# Patient Record
Sex: Female | Born: 1940 | Race: White | Hispanic: No | Marital: Single | Smoking: Never smoker
Health system: Southern US, Community
[De-identification: ages and names within clinical notes are randomized; demographics above are authoritative.]

## PROBLEM LIST (undated history)

## (undated) DIAGNOSIS — M797 Fibromyalgia: Secondary | ICD-10-CM

## (undated) DIAGNOSIS — E0500 Thyrotoxicosis with diffuse goiter without thyrotoxic crisis or storm: Secondary | ICD-10-CM

## (undated) DIAGNOSIS — C801 Malignant (primary) neoplasm, unspecified: Secondary | ICD-10-CM

## (undated) DIAGNOSIS — F419 Anxiety disorder, unspecified: Secondary | ICD-10-CM

## (undated) DIAGNOSIS — E119 Type 2 diabetes mellitus without complications: Secondary | ICD-10-CM

## (undated) DIAGNOSIS — B009 Herpesviral infection, unspecified: Secondary | ICD-10-CM

## (undated) DIAGNOSIS — J4520 Mild intermittent asthma, uncomplicated: Principal | ICD-10-CM

## (undated) DIAGNOSIS — R002 Palpitations: Secondary | ICD-10-CM

## (undated) DIAGNOSIS — K219 Gastro-esophageal reflux disease without esophagitis: Secondary | ICD-10-CM

## (undated) DIAGNOSIS — E785 Hyperlipidemia, unspecified: Secondary | ICD-10-CM

## (undated) DIAGNOSIS — M353 Polymyalgia rheumatica: Secondary | ICD-10-CM

## (undated) HISTORY — PX: CARPAL TUNNEL RELEASE: SHX101

## (undated) HISTORY — PX: APPENDECTOMY: SHX54

## (undated) HISTORY — PX: TOTAL ABDOMINAL HYSTERECTOMY W/ BILATERAL SALPINGOOPHORECTOMY: SHX83

## (undated) HISTORY — DX: Mild intermittent asthma, uncomplicated: J45.20

## (undated) HISTORY — PX: ABDOMINAL HYSTERECTOMY: SHX81

## (undated) HISTORY — PX: HERNIA REPAIR: SHX51

---

## 2010-03-21 ENCOUNTER — Emergency Department (HOSPITAL_COMMUNITY)
Admission: EM | Admit: 2010-03-21 | Discharge: 2010-03-21 | Payer: Self-pay | Source: Home / Self Care | Admitting: Emergency Medicine

## 2010-03-21 IMAGING — CR DG CHEST 1V
1 series · 1 of 1 positions shown · non-contrast
Comparison: None.

CLINICAL DATA: Pain post fall

CHEST - 1 VIEW

[view not recorded]
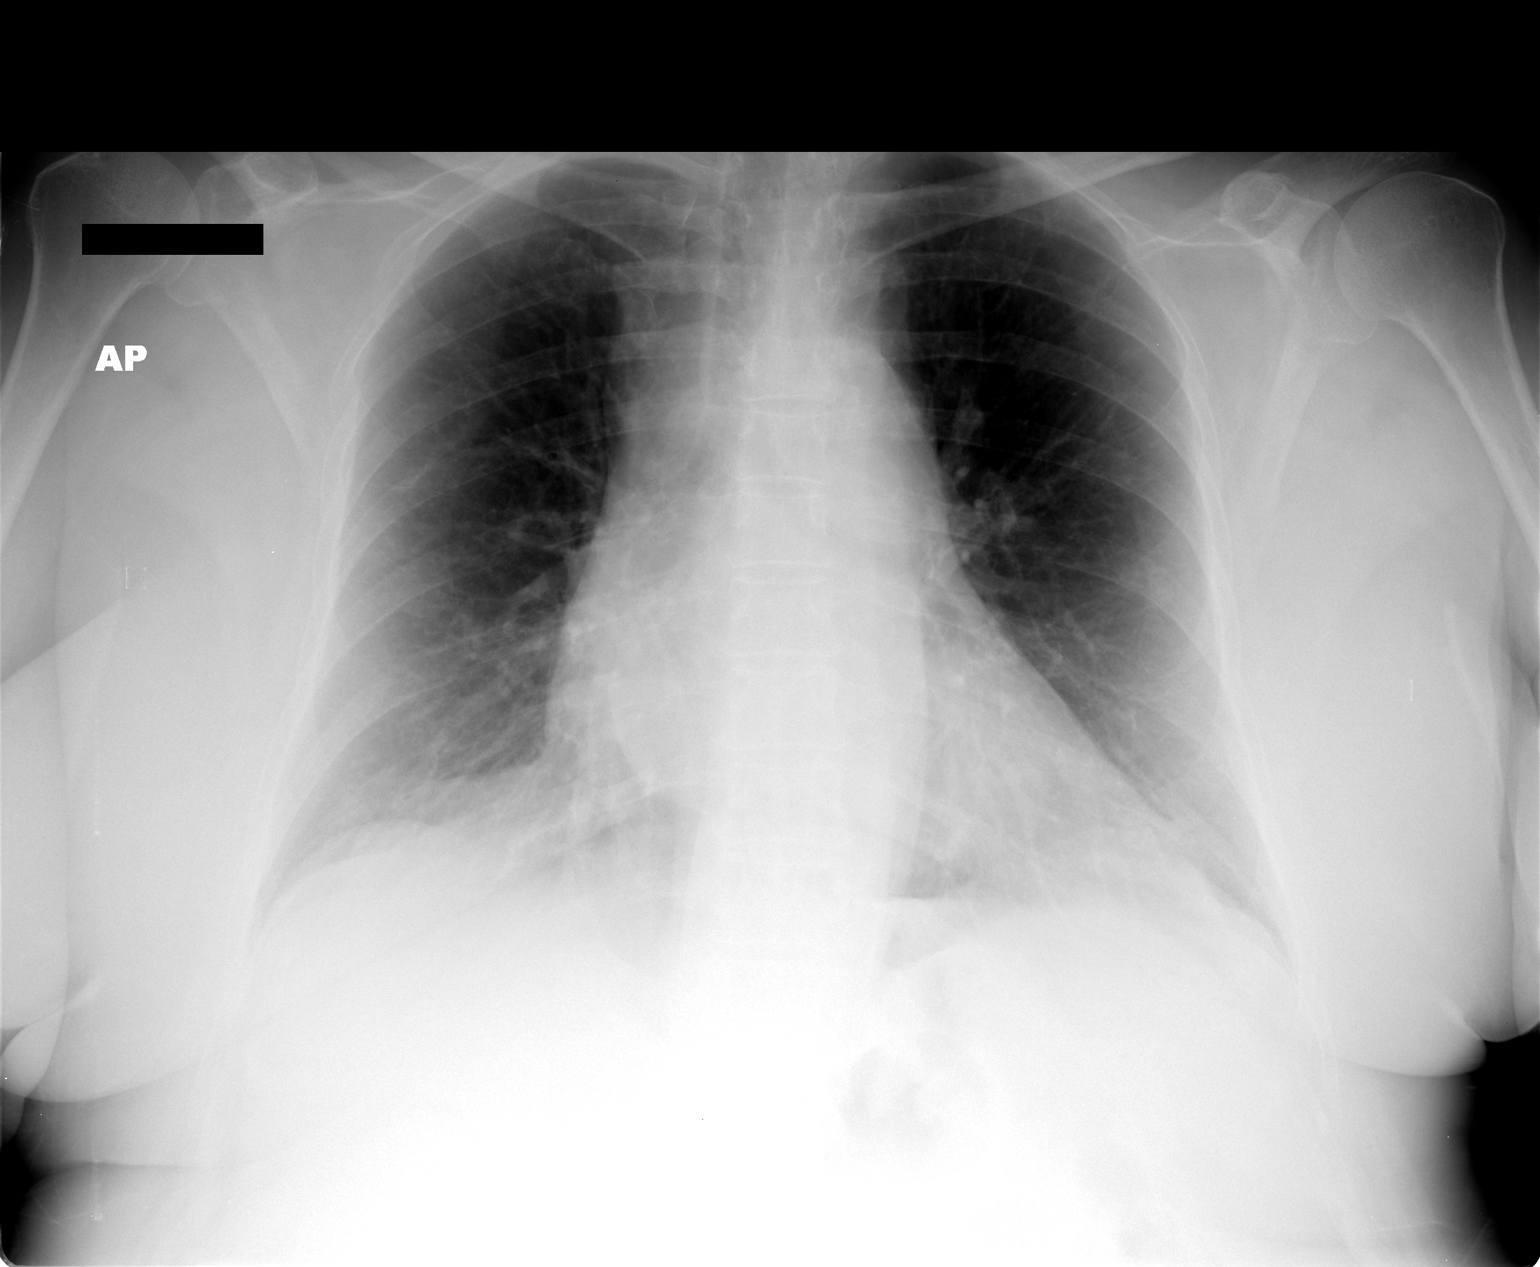

[1 of 1 positions shown; findings below may reference images not displayed]

FINDINGS: Mild cardiomegaly.  Lungs are clear.  No pneumothorax or
effusion.  Regional bones unremarkable.
IMPRESSION: 1.  Mild cardiomegaly

## 2010-03-21 IMAGING — CR DG LUMBAR SPINE 2-3V
3 series · 3 of 3 positions shown · non-contrast
Comparison: None.

CLINICAL DATA: Pain post fall

LUMBAR SPINE - 2-3 VIEW

[view not recorded (1 of 3)]
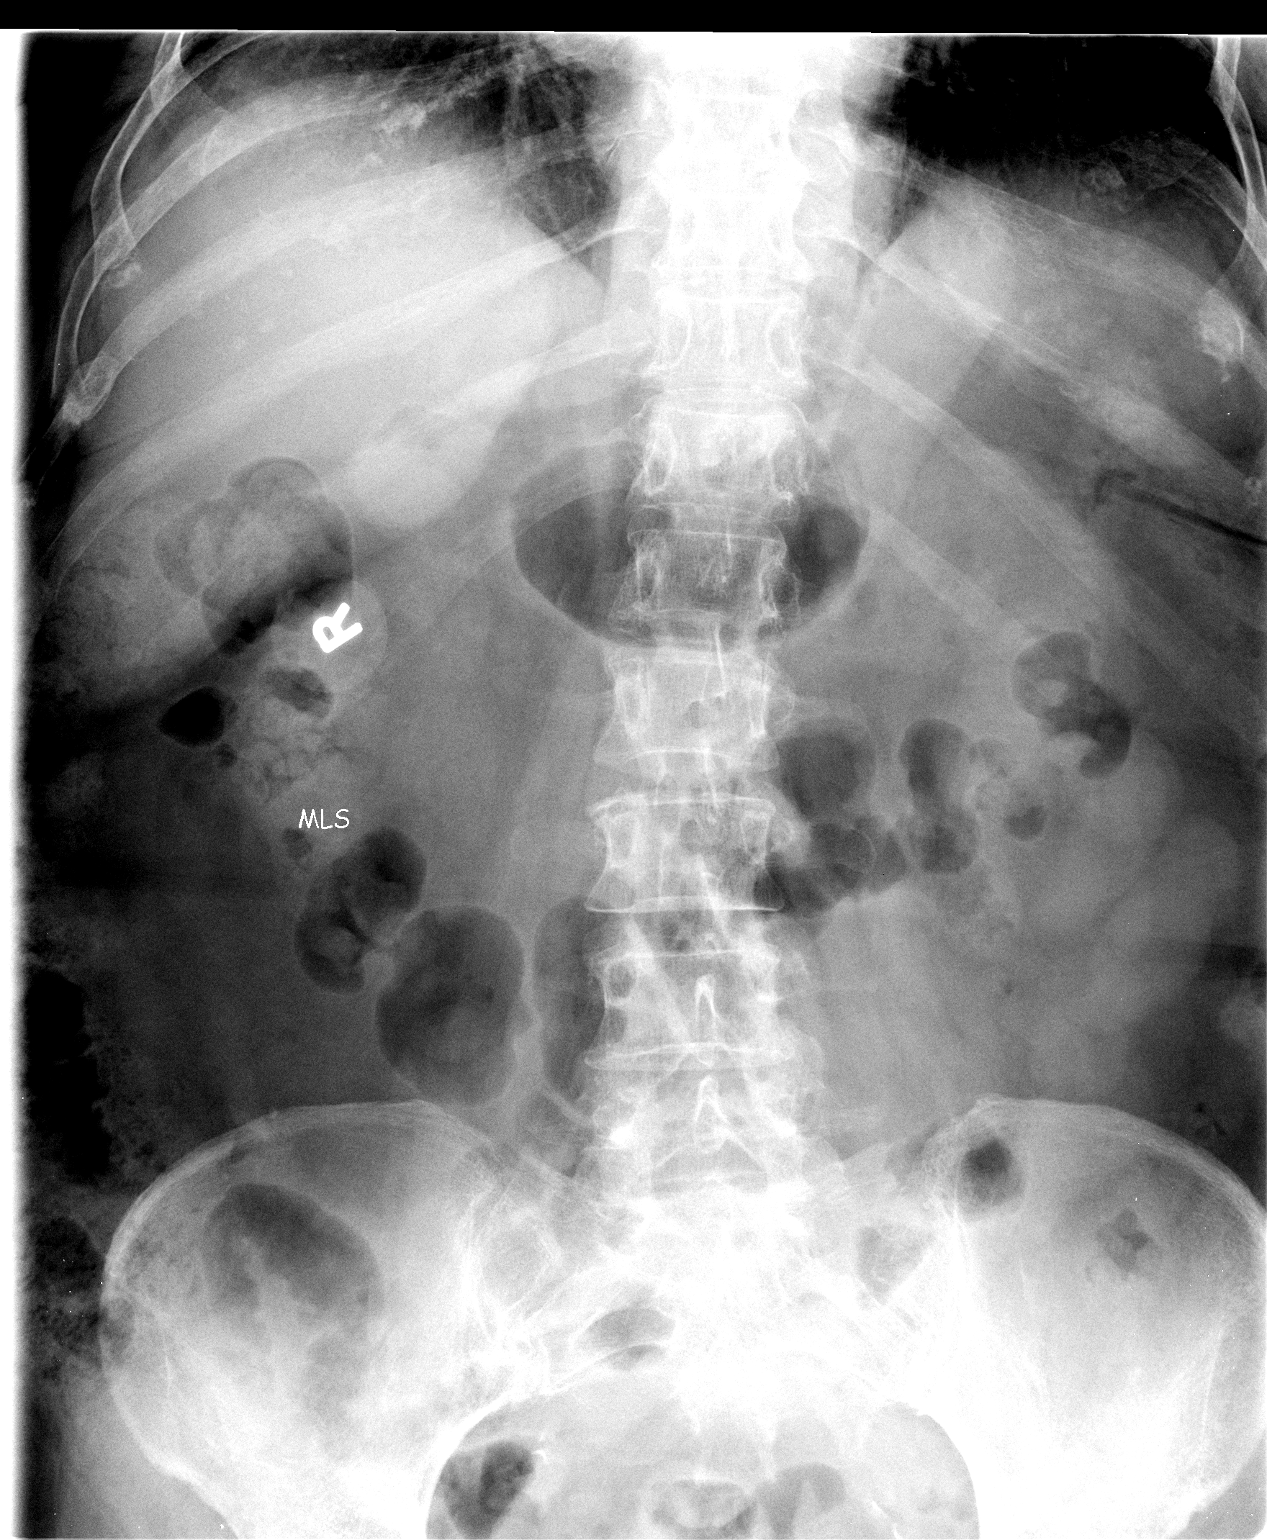

[view not recorded (2 of 3)]
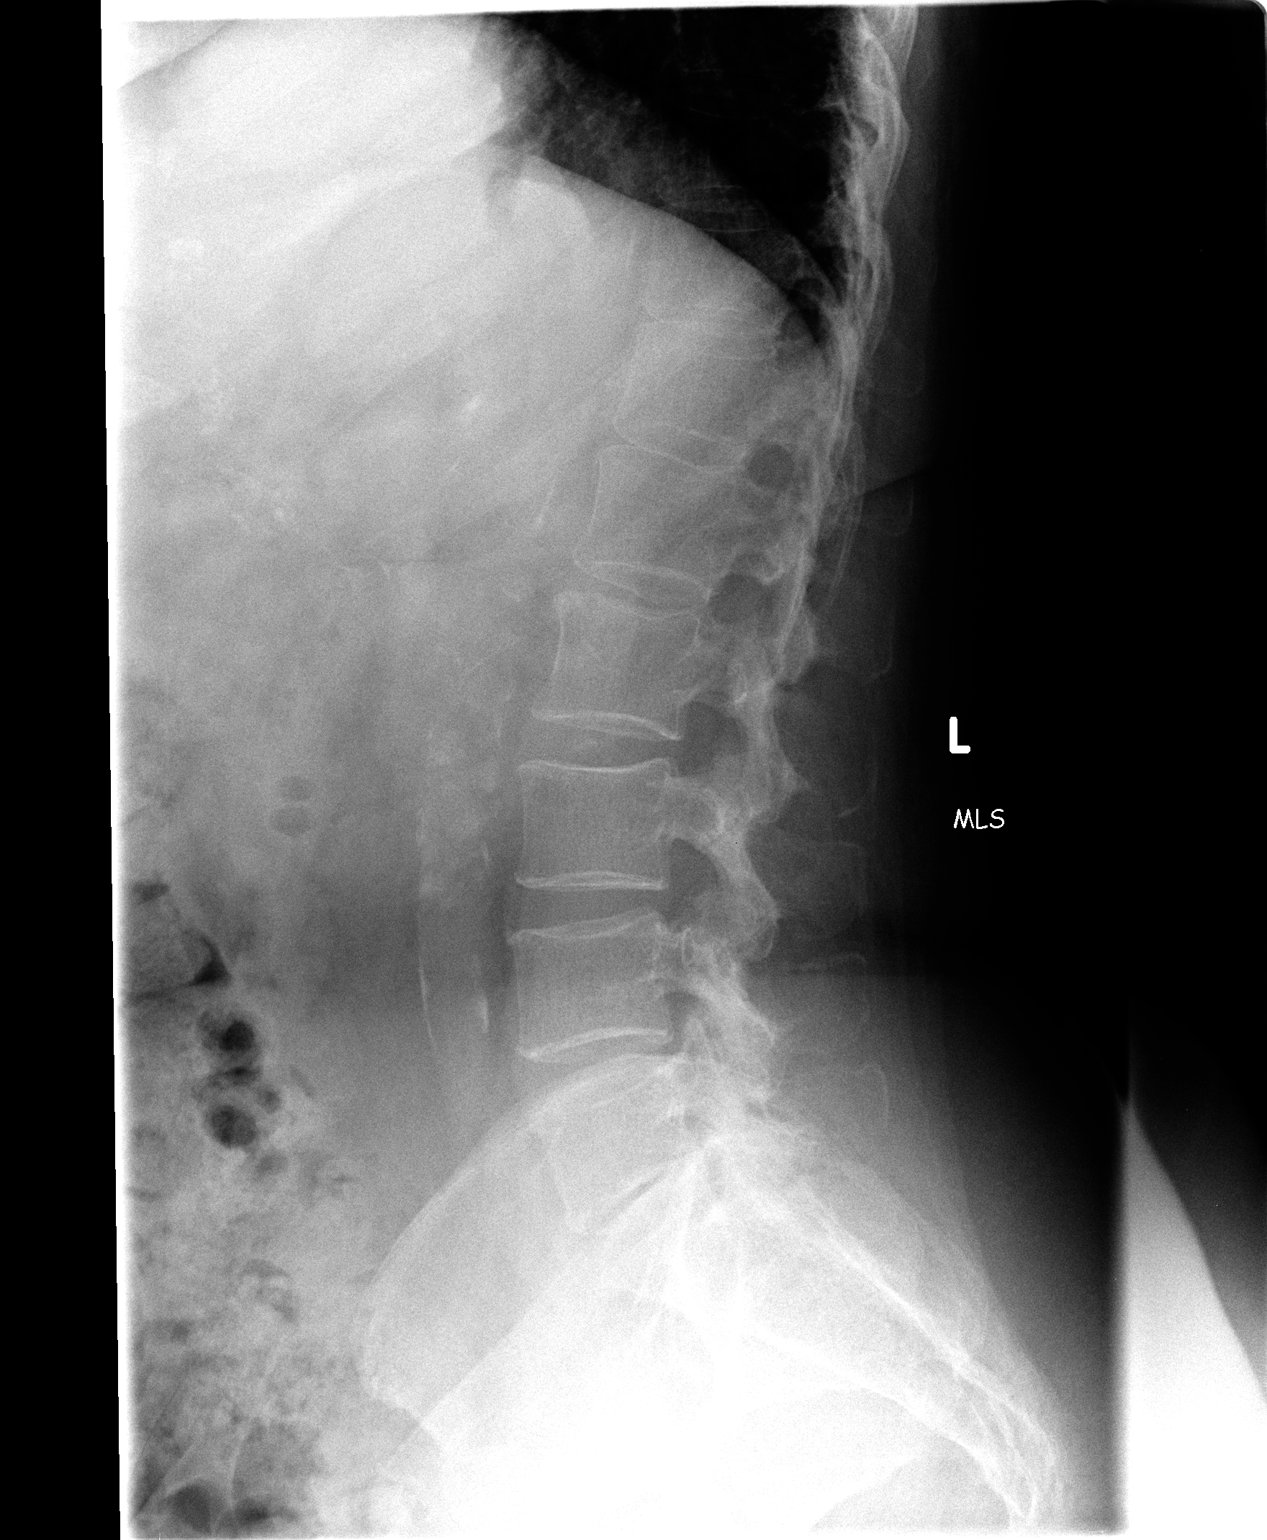

[view not recorded (3 of 3)]
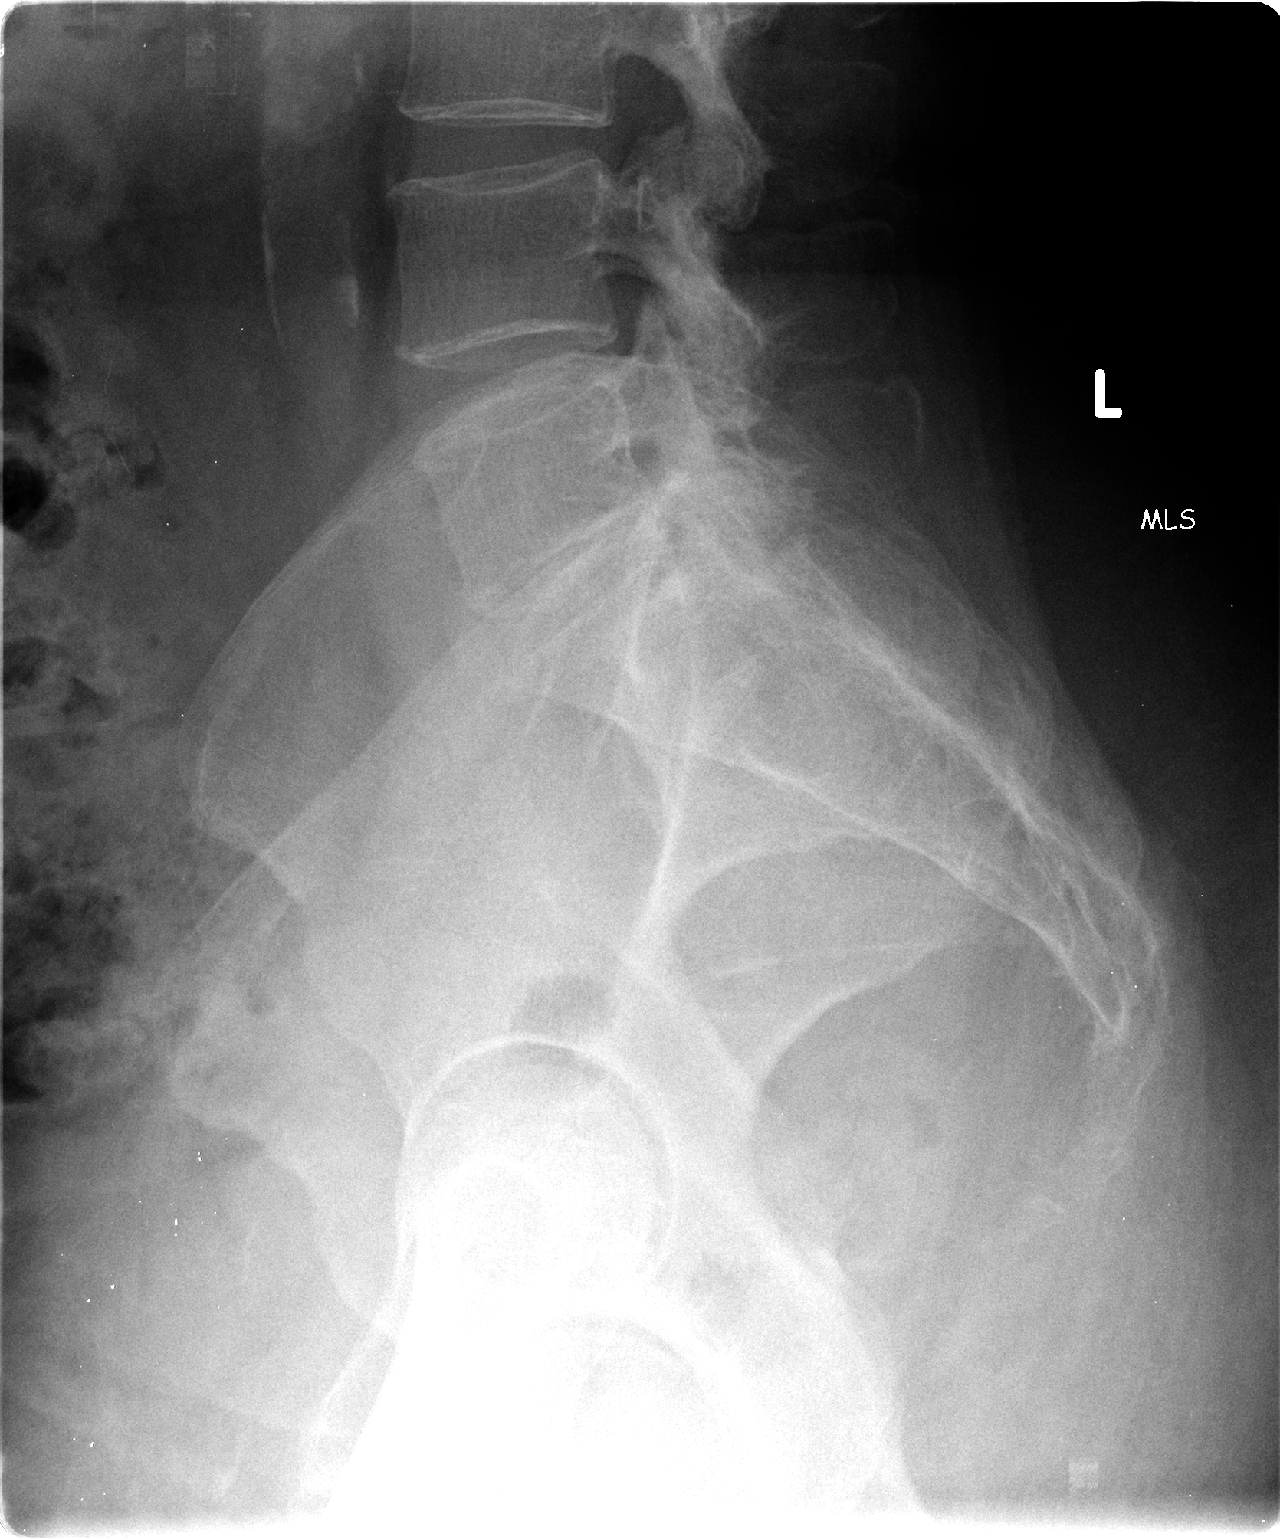

[3 of 3 positions shown; findings below may reference images not displayed]

FINDINGS: Narrowing of the L5-S1 interspace.  Normal alignment.
Negative for fracture.  Aortic and pelvic vascular calcifications.
IMPRESSION: 1.  Negative for fracture or other acute bone injury.
2.  Degenerative disc disease L5-S1.

## 2010-03-21 IMAGING — CR DG WRIST COMPLETE 3+V*L*
4 series · 4 of 4 positions shown · non-contrast
Comparison: None.

CLINICAL DATA: Pain post fall

LEFT WRIST - COMPLETE 3+ VIEW

[view not recorded (1 of 4)]
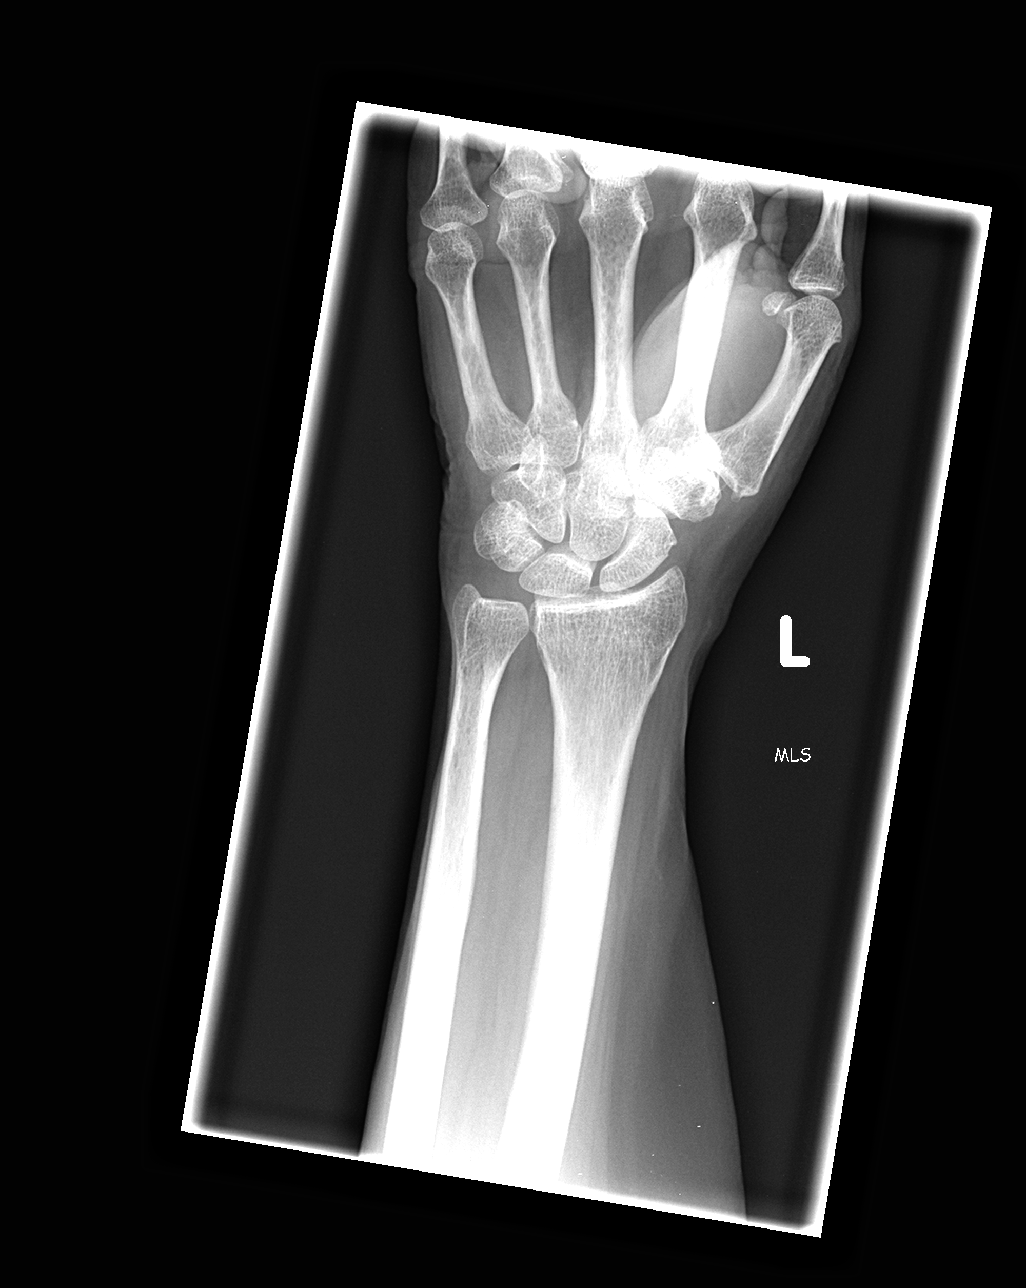

[view not recorded (2 of 4)]
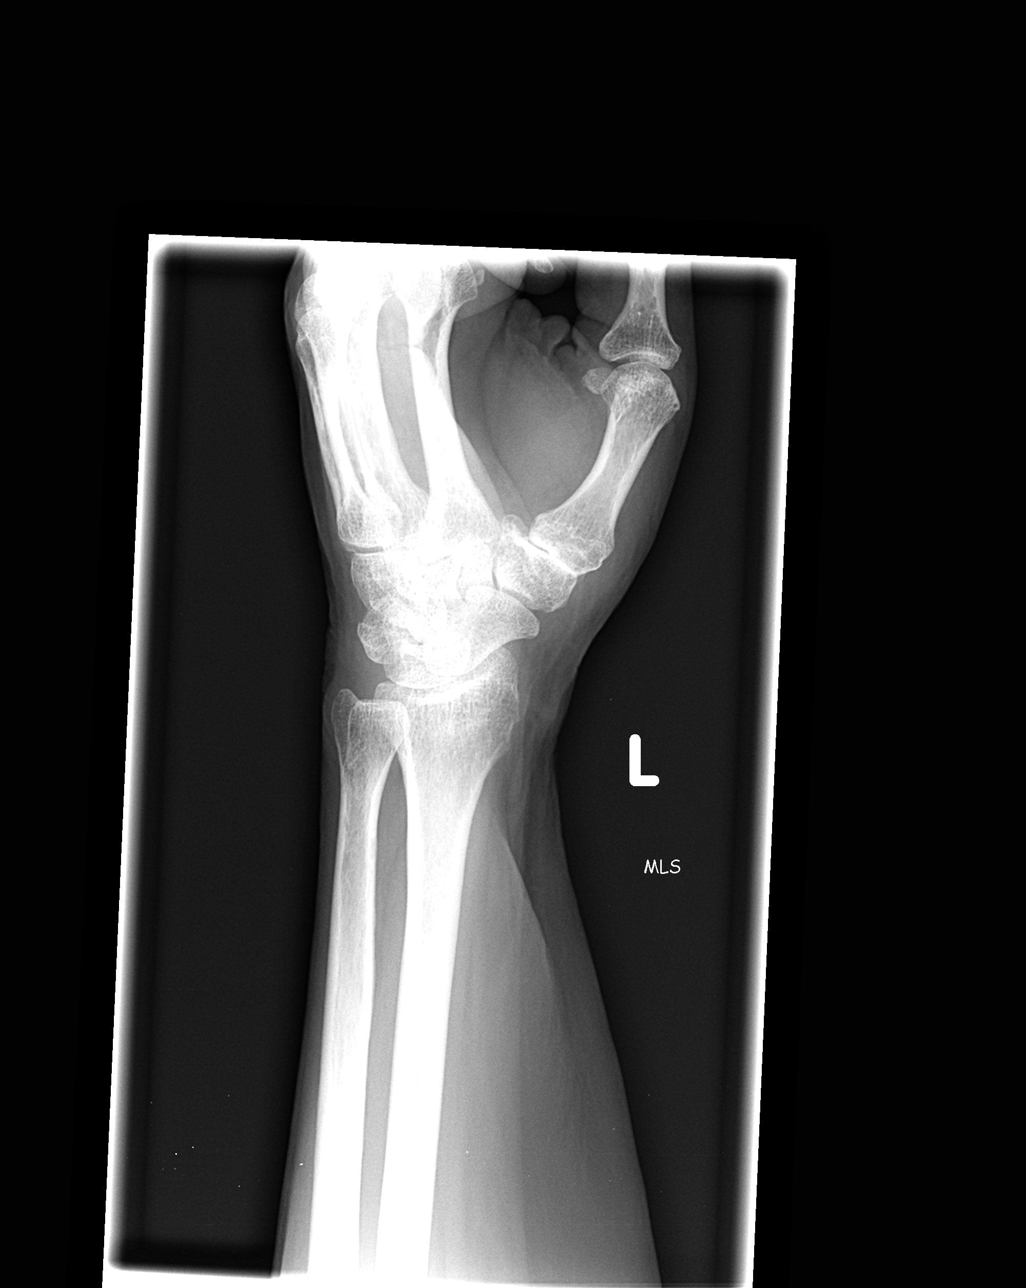

[view not recorded (3 of 4)]
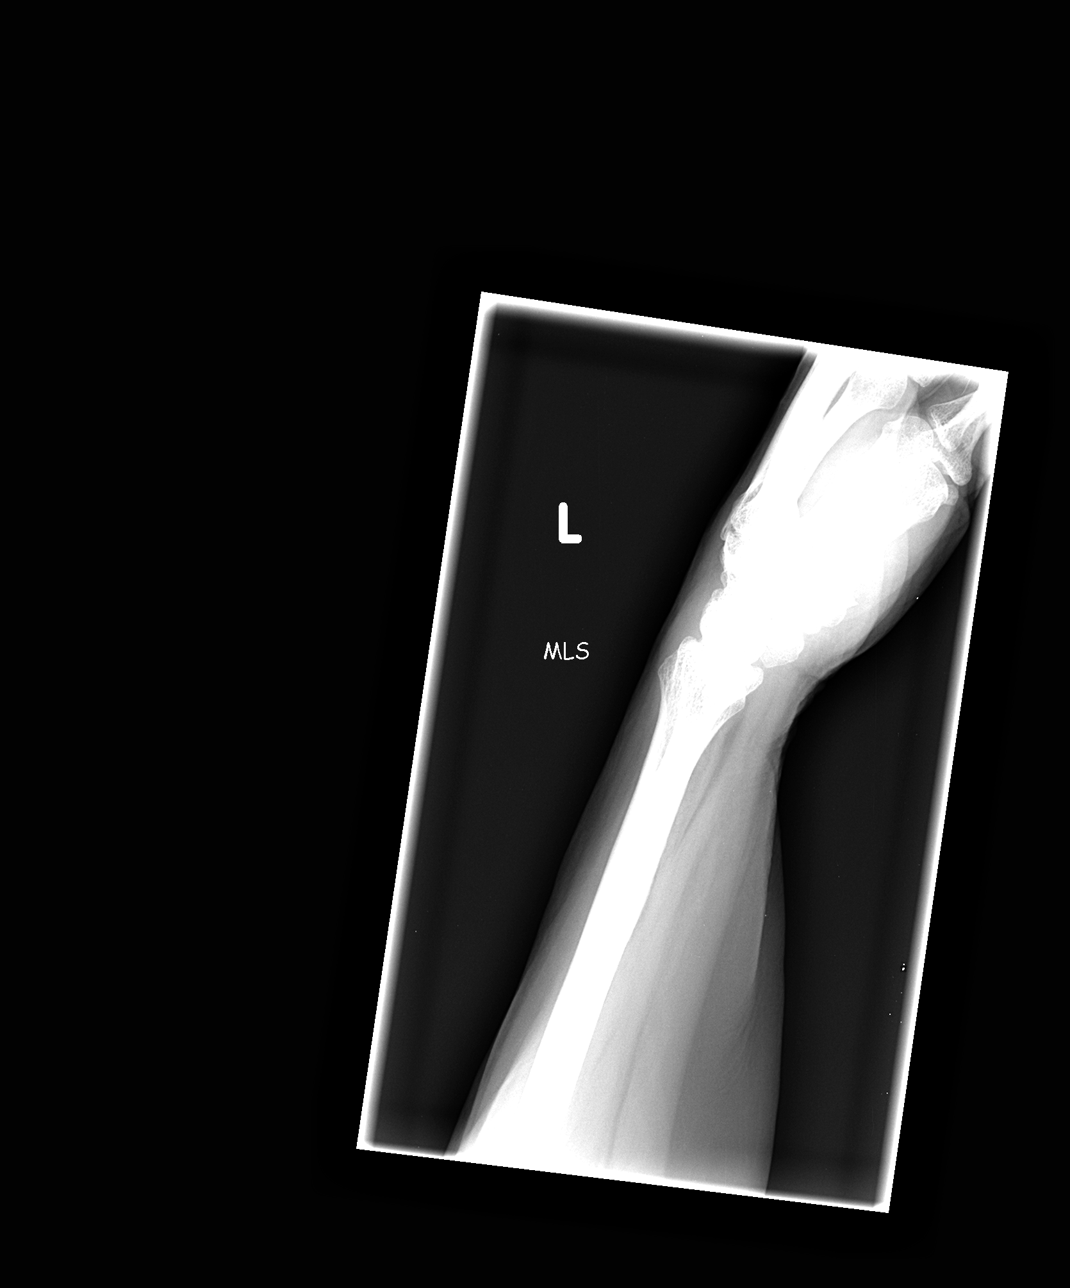

[view not recorded (4 of 4)]
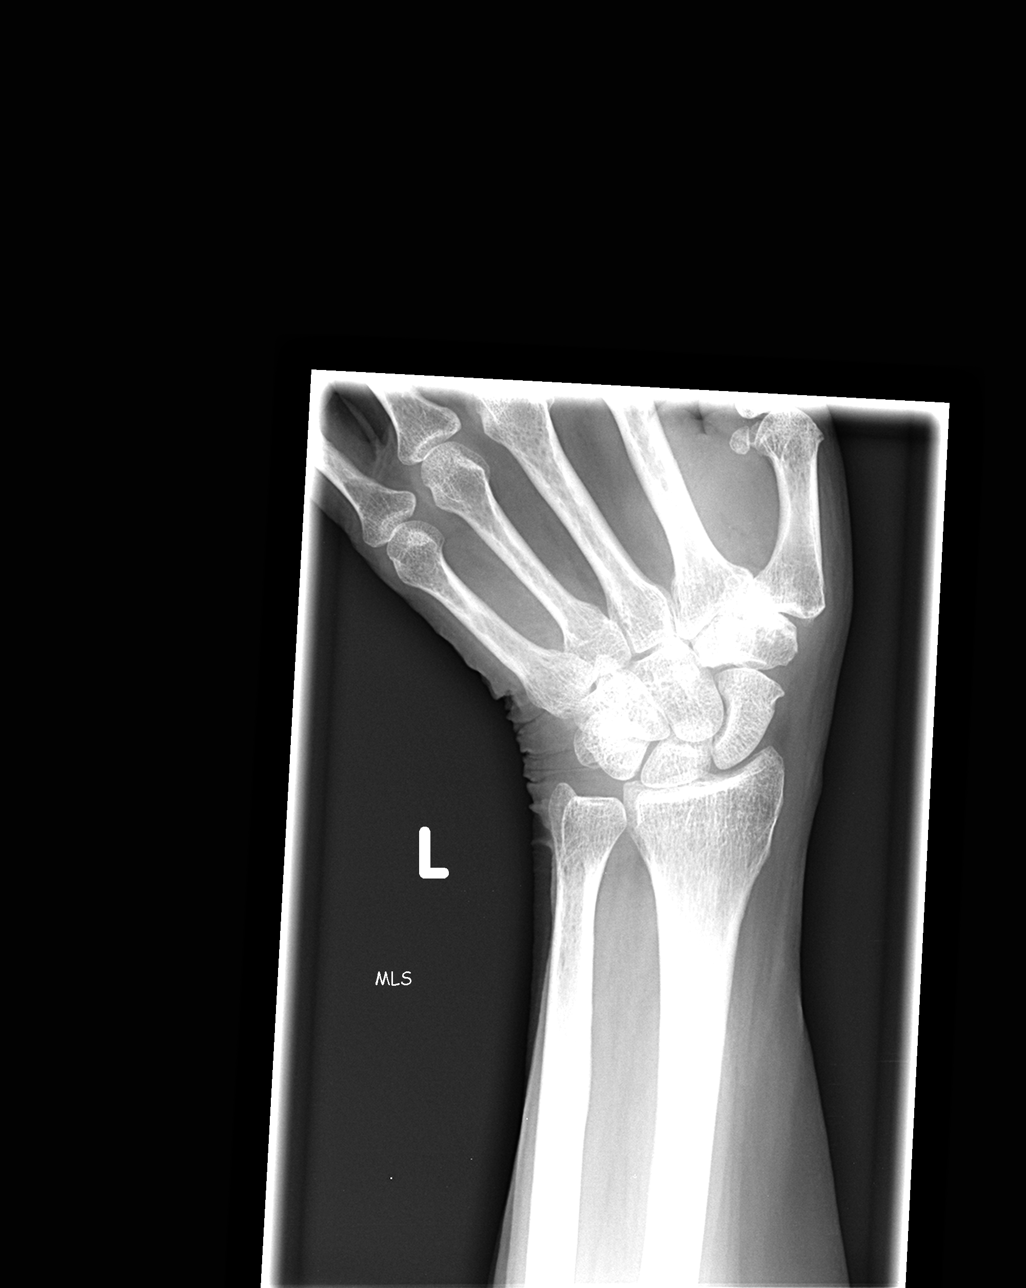

[4 of 4 positions shown; findings below may reference images not displayed]

FINDINGS: Carpal rows intact.  Early degenerative changes at the
first carpometacarpal articulation. Negative for fracture,
dislocation, or other acute abnormality.  Normal alignment and
mineralization.    Regional soft tissues unremarkable.
IMPRESSION: 1.  Negative for fracture or other acute bone injury.
2.  Early first CMC degenerative change.

## 2010-03-21 IMAGING — CR DG KNEE COMPLETE 4+V*L*
4 series · 4 of 4 positions shown · non-contrast
Comparison: None.

CLINICAL DATA: Pain post fall

LEFT KNEE - COMPLETE 4+ VIEW

[view not recorded (1 of 4)]
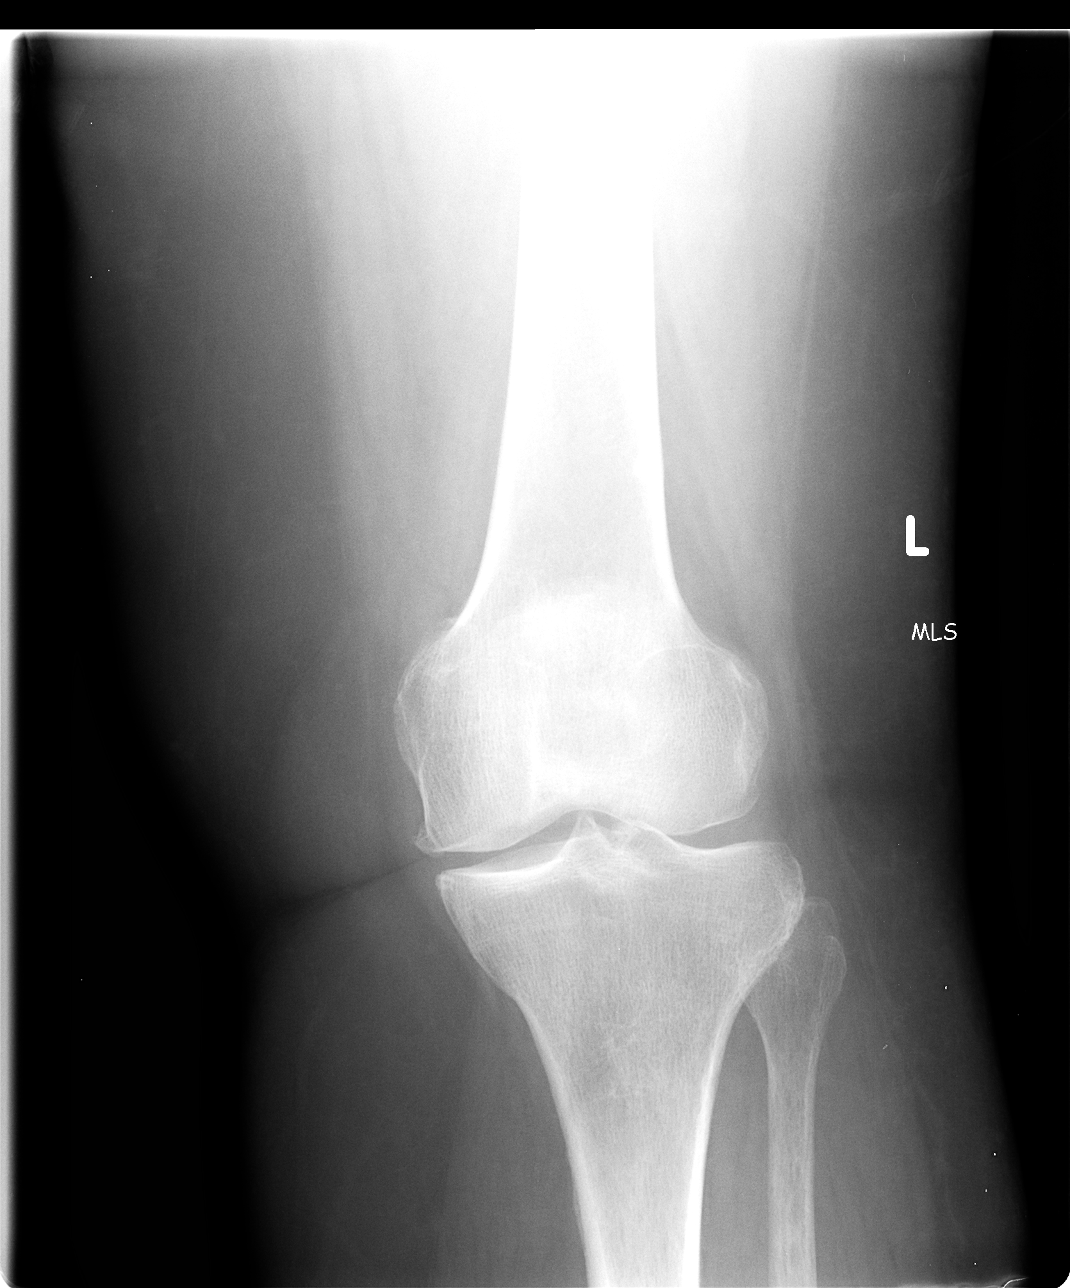

[view not recorded (2 of 4)]
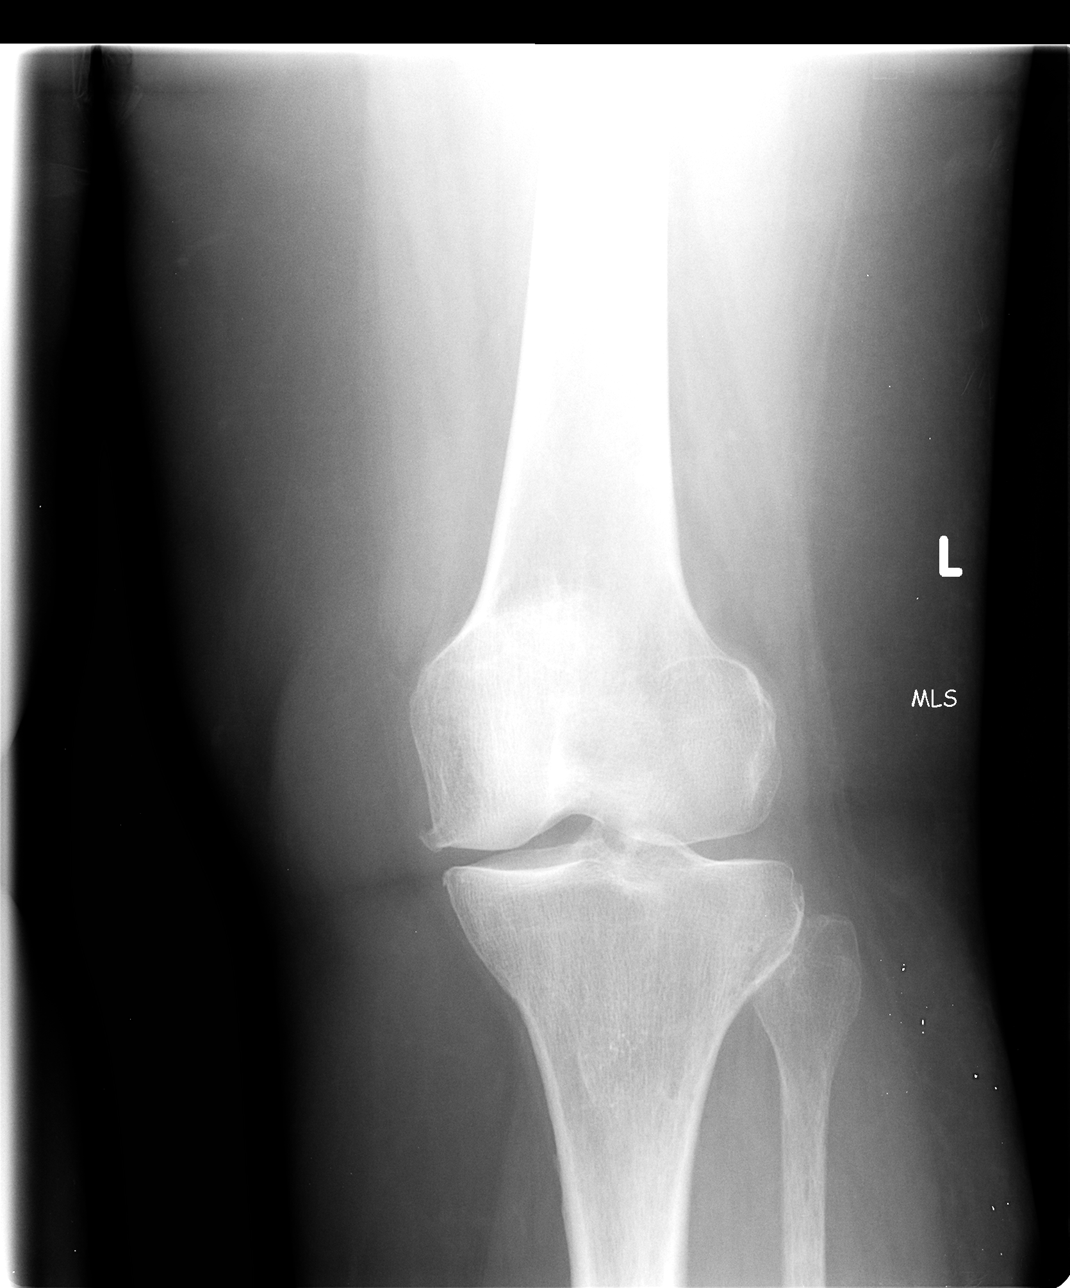

[view not recorded (3 of 4)]
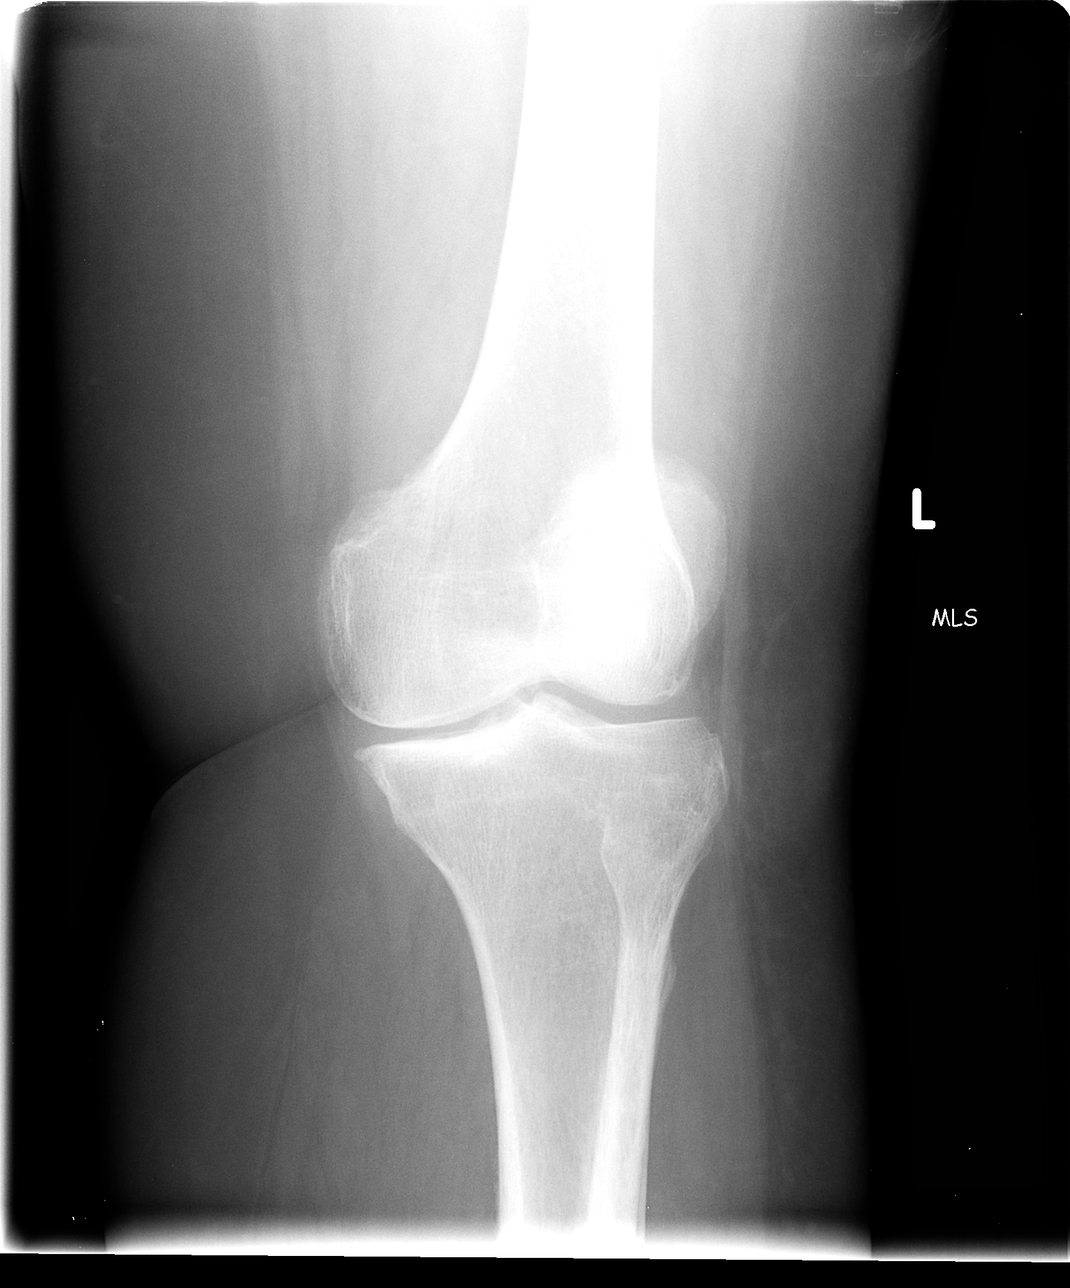

[view not recorded (4 of 4)]
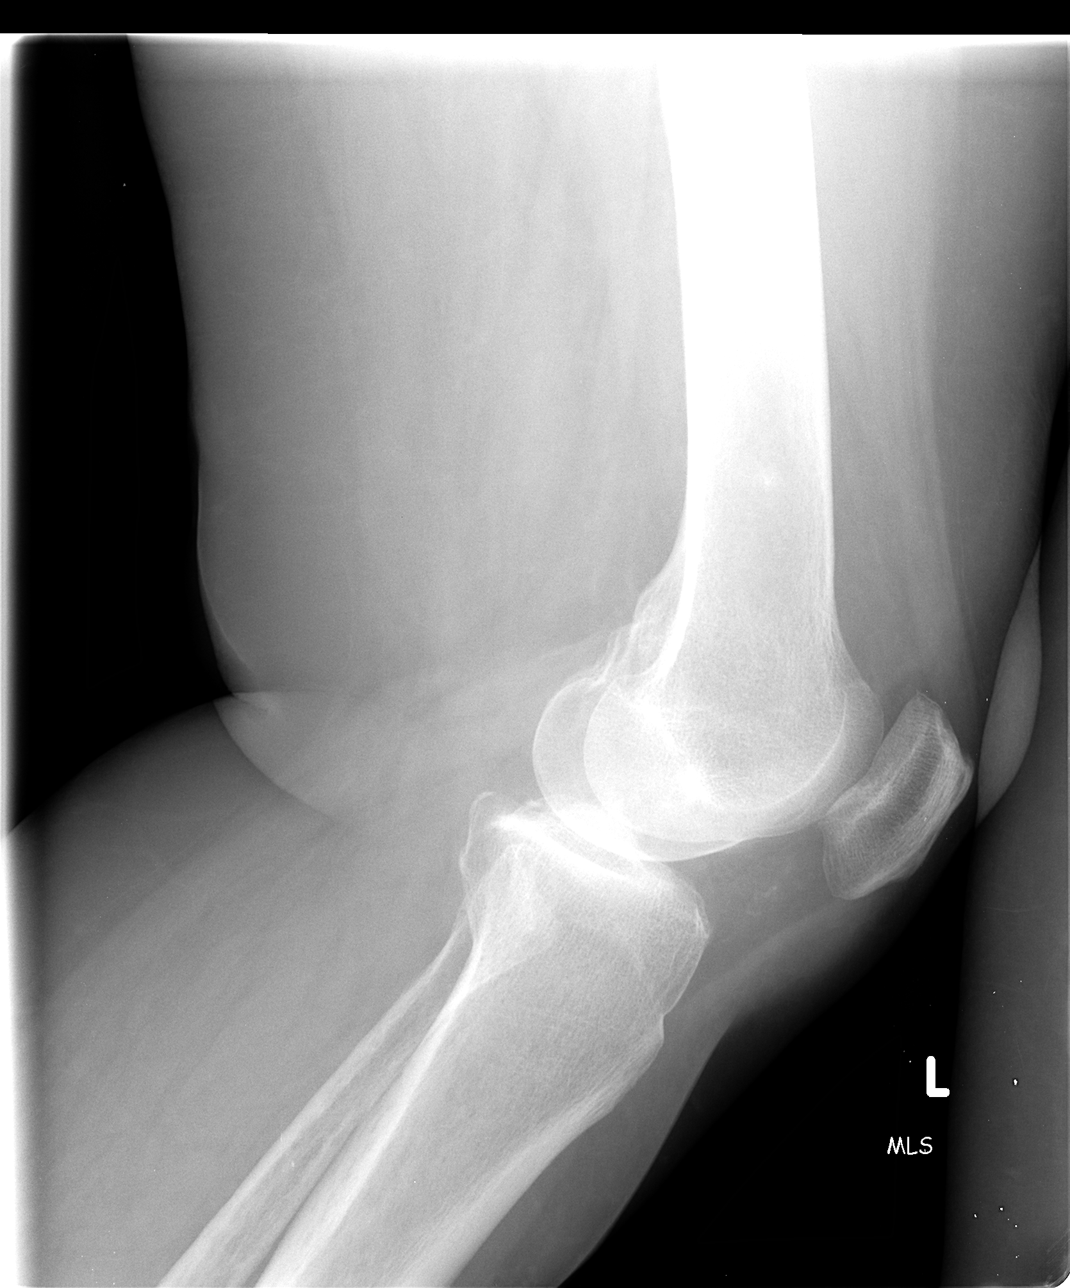

[4 of 4 positions shown; findings below may reference images not displayed]

FINDINGS: Small marginal spurs about the medial compartment.  There
is mild narrowing of the articular cartilage.  There is a small
effusion in the suprapatellar bursa.  Negative for fracture,
dislocation, or other acute bony abnormality.  Normal alignment and
mineralization.
IMPRESSION: 1.  Negative for fracture or other acute bony abnormality.
2.  Medial compartment degenerative changes.
3.  Small effusion.

## 2010-03-21 IMAGING — CR DG SHOULDER 2+V*L*
3 series · 3 of 3 positions shown · non-contrast
Comparison: None.

CLINICAL DATA: Pain post fall

LEFT SHOULDER - 2+ VIEW

[view not recorded (1 of 3)]
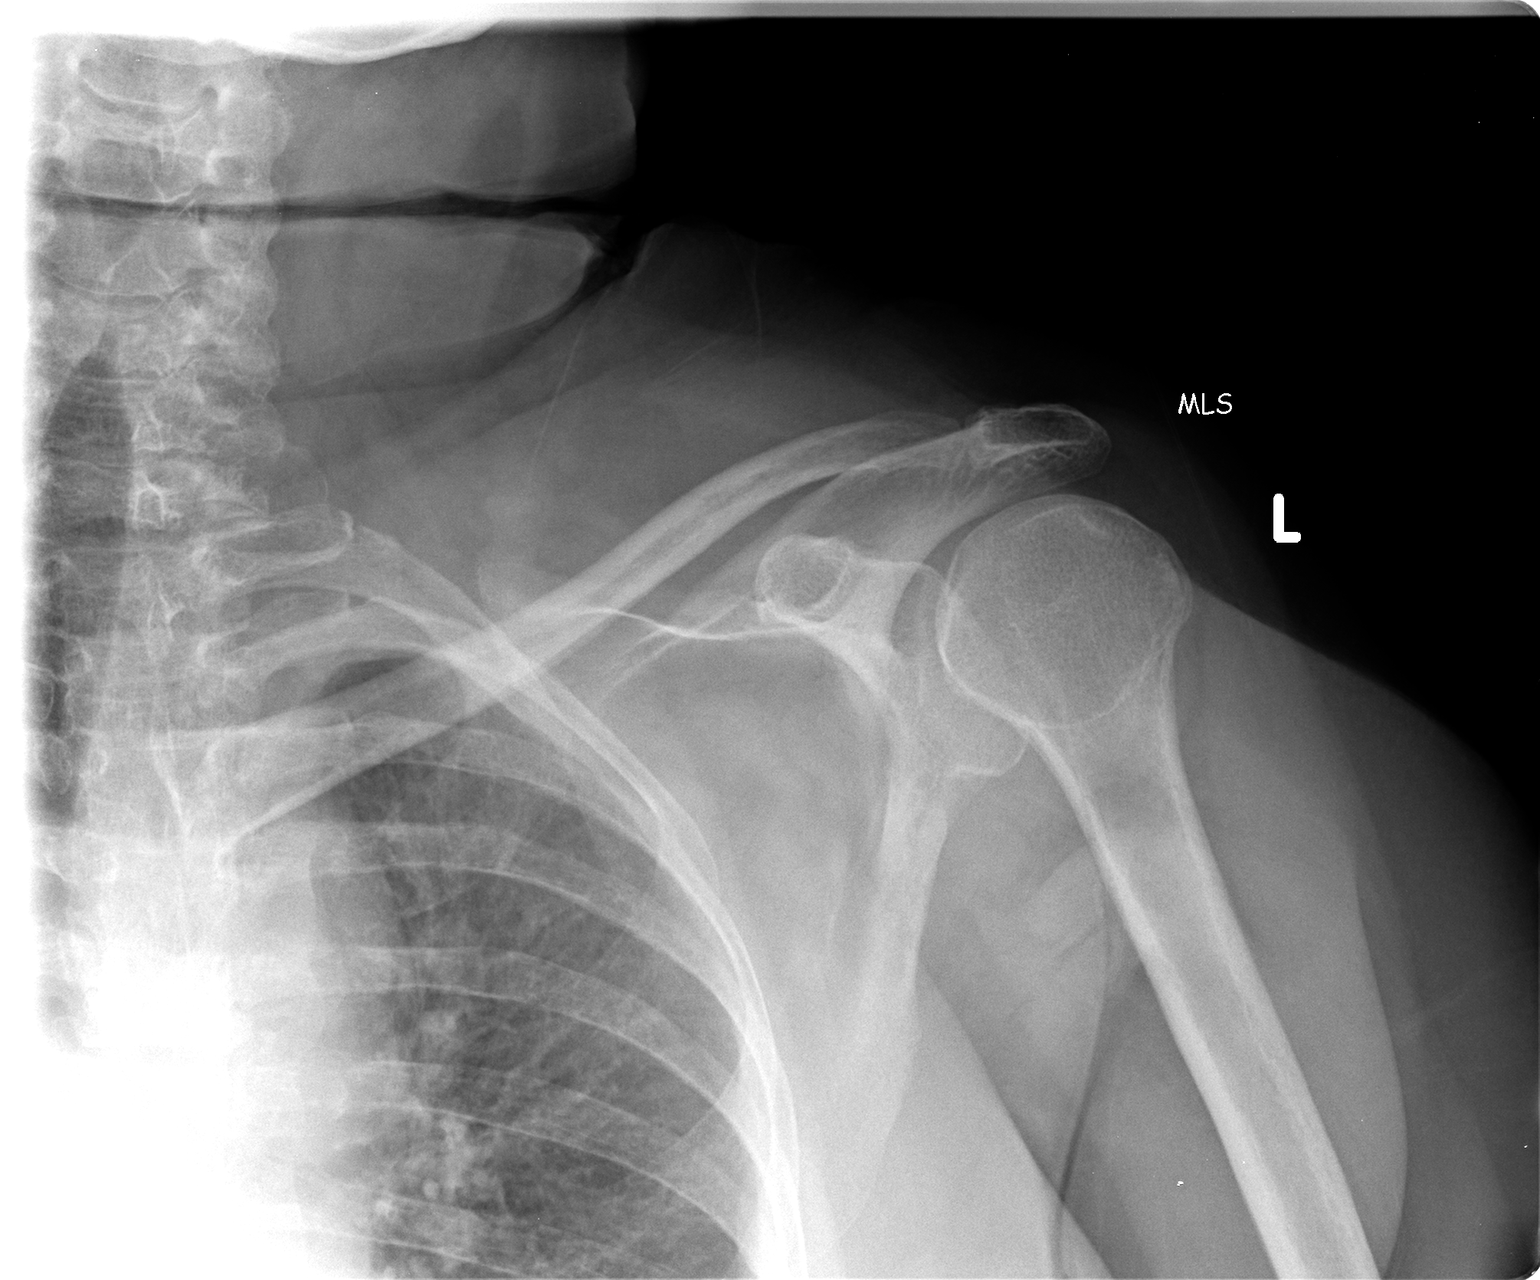

[view not recorded (2 of 3)]
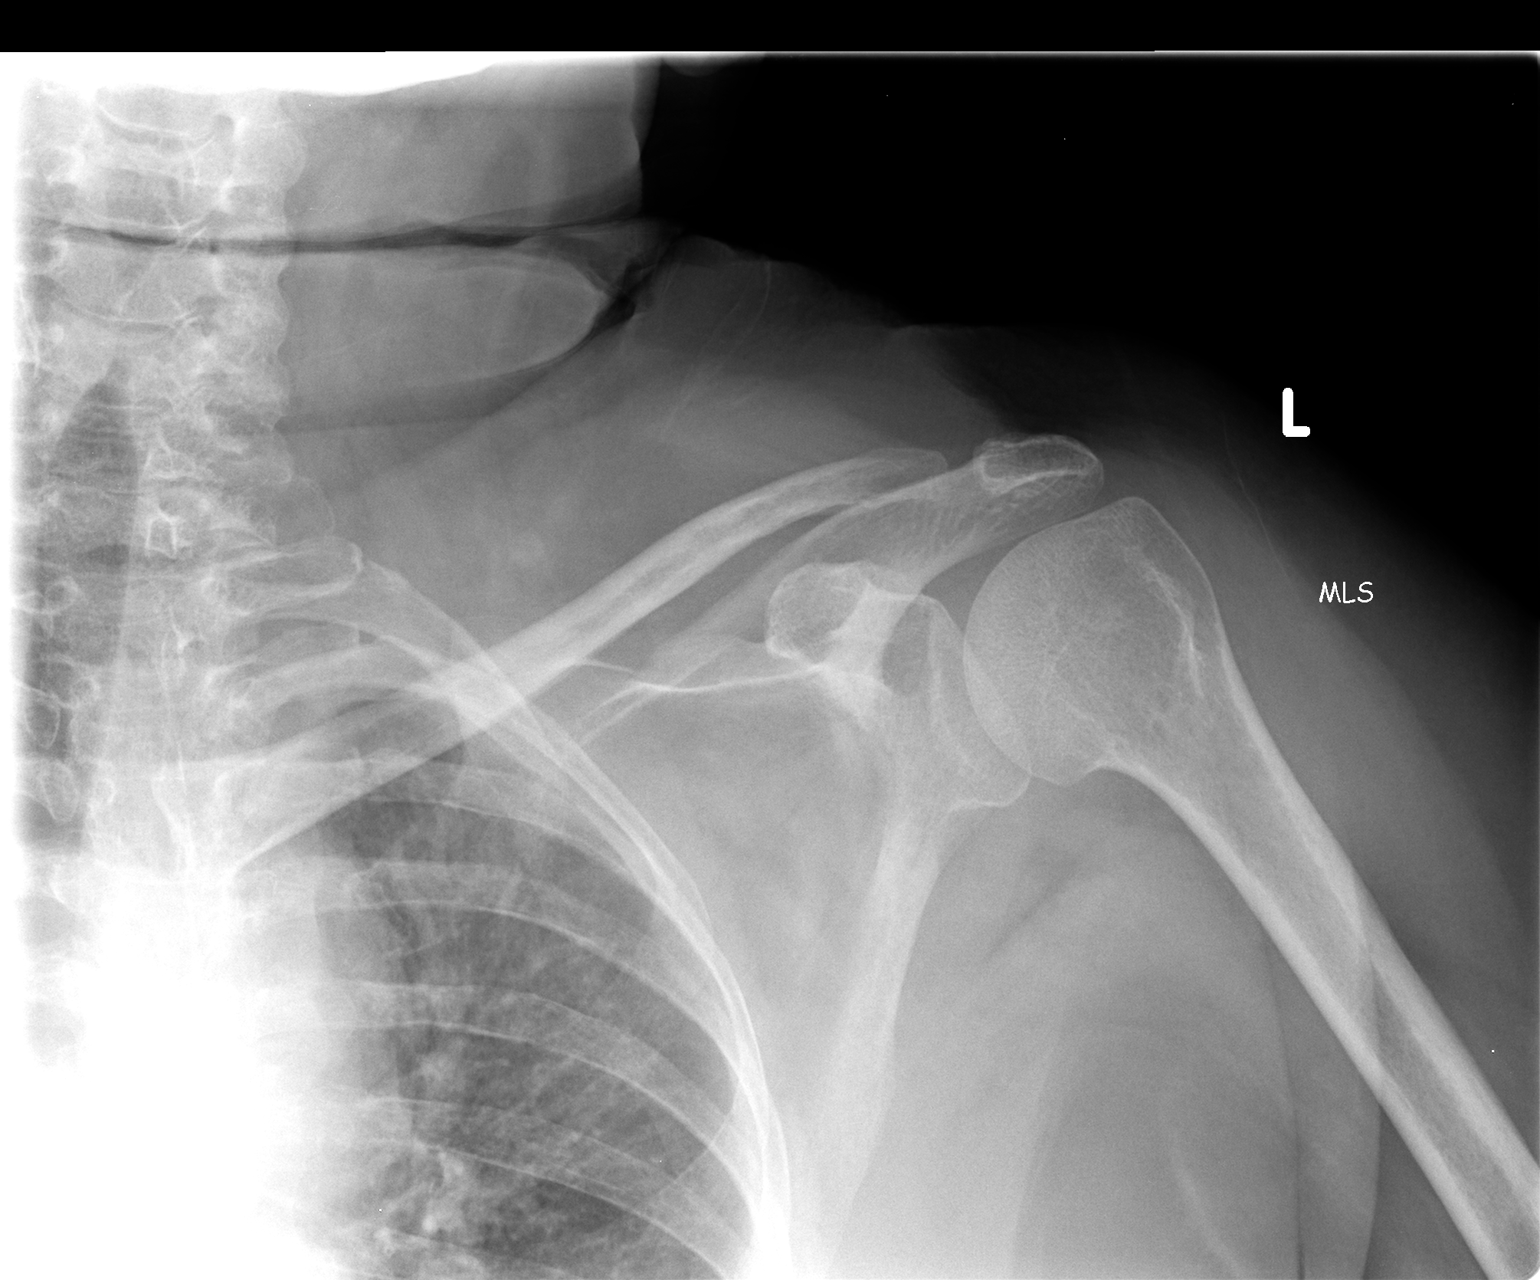

[view not recorded (3 of 3)]
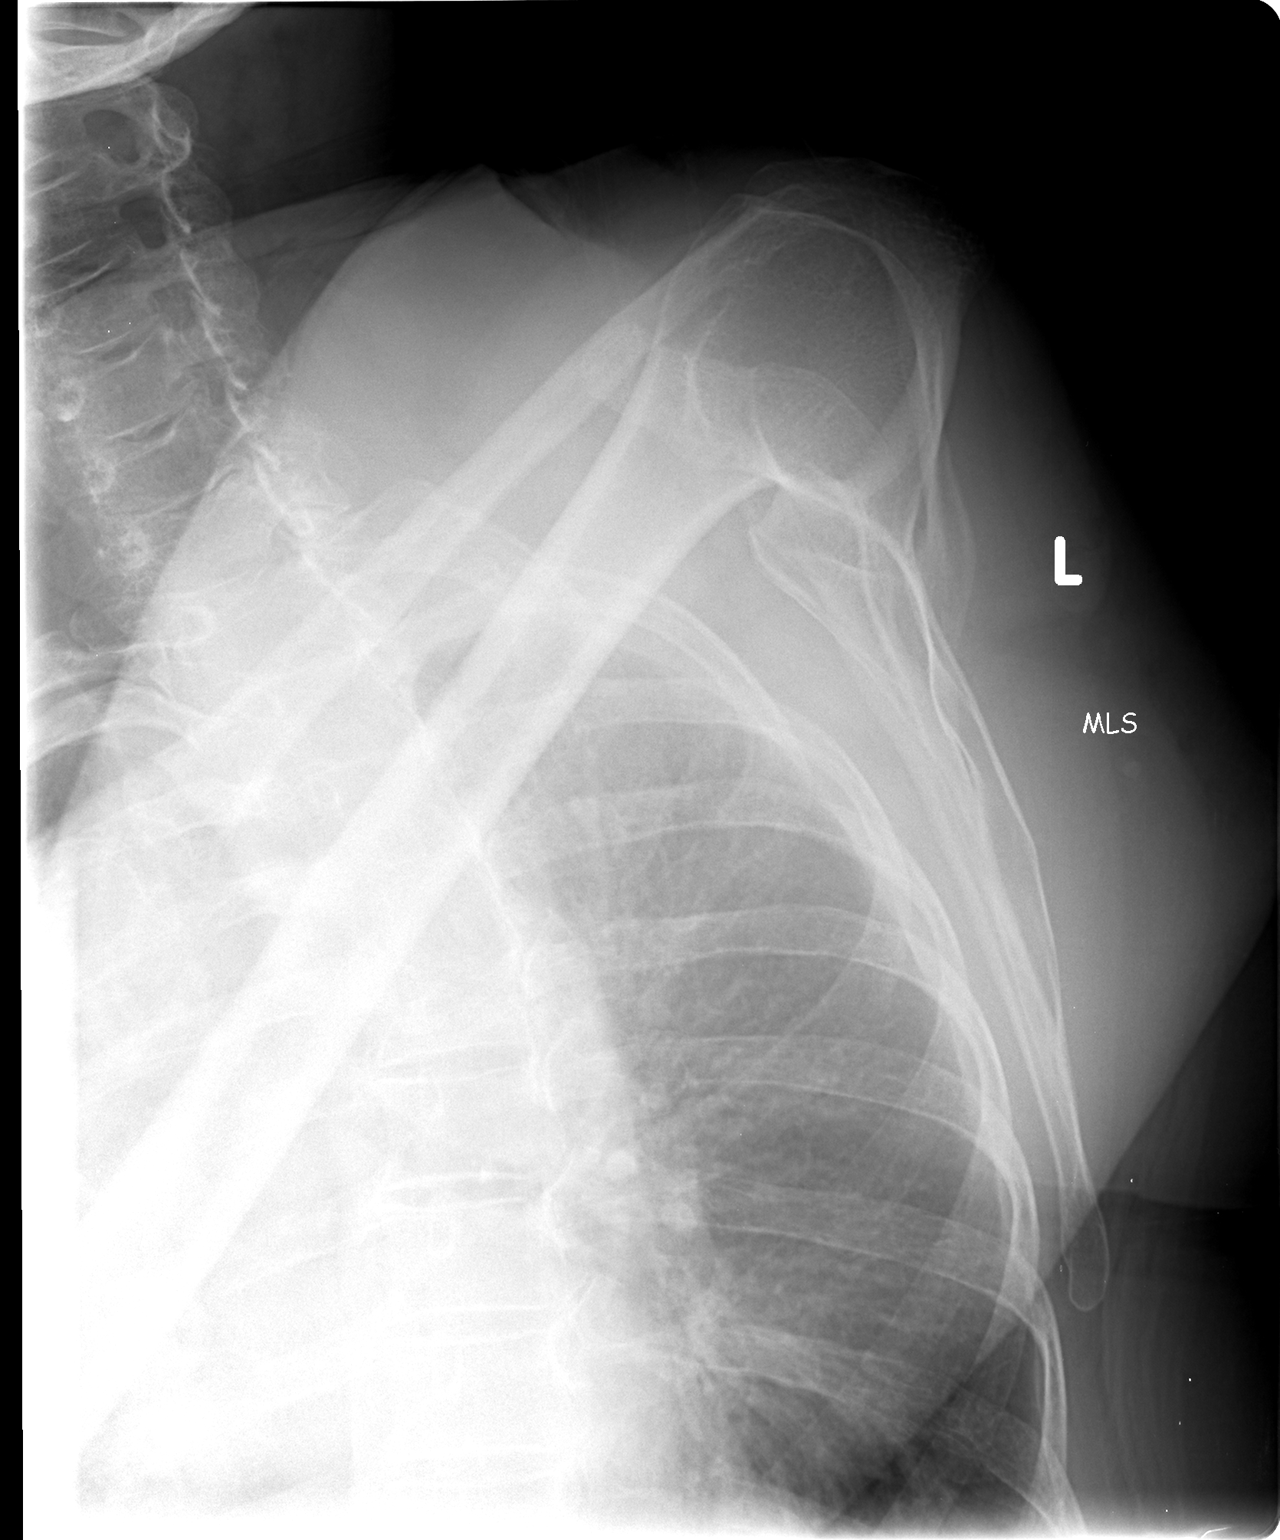

[3 of 3 positions shown; findings below may reference images not displayed]

FINDINGS: Negative for fracture, dislocation, or other acute
abnormality.  Normal alignment and mineralization. No significant
degenerative change.  Regional soft tissues unremarkable.
IMPRESSION: Negative

## 2010-03-21 IMAGING — CR DG ANKLE COMPLETE 3+V*L*
3 series · 3 of 3 positions shown · non-contrast
Comparison: None.

CLINICAL DATA: Pain post fall

LEFT ANKLE COMPLETE - 3+ VIEW

[view not recorded (1 of 3)]
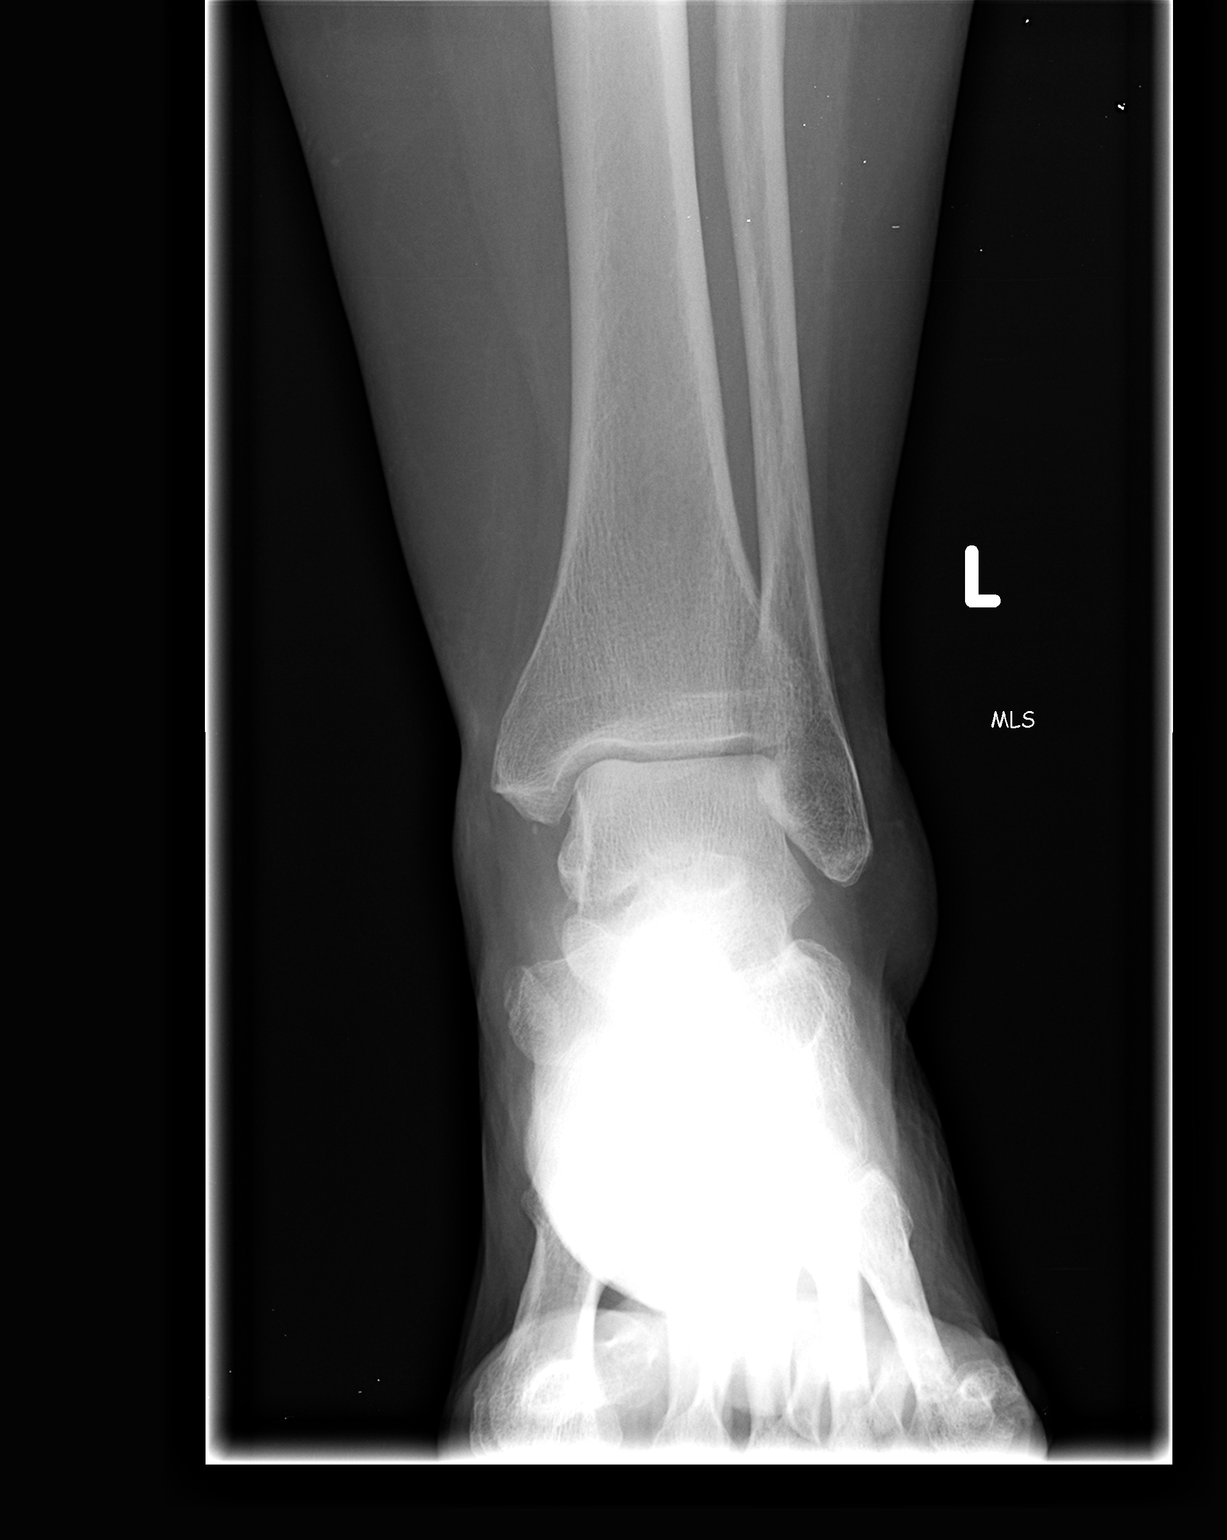

[view not recorded (2 of 3)]
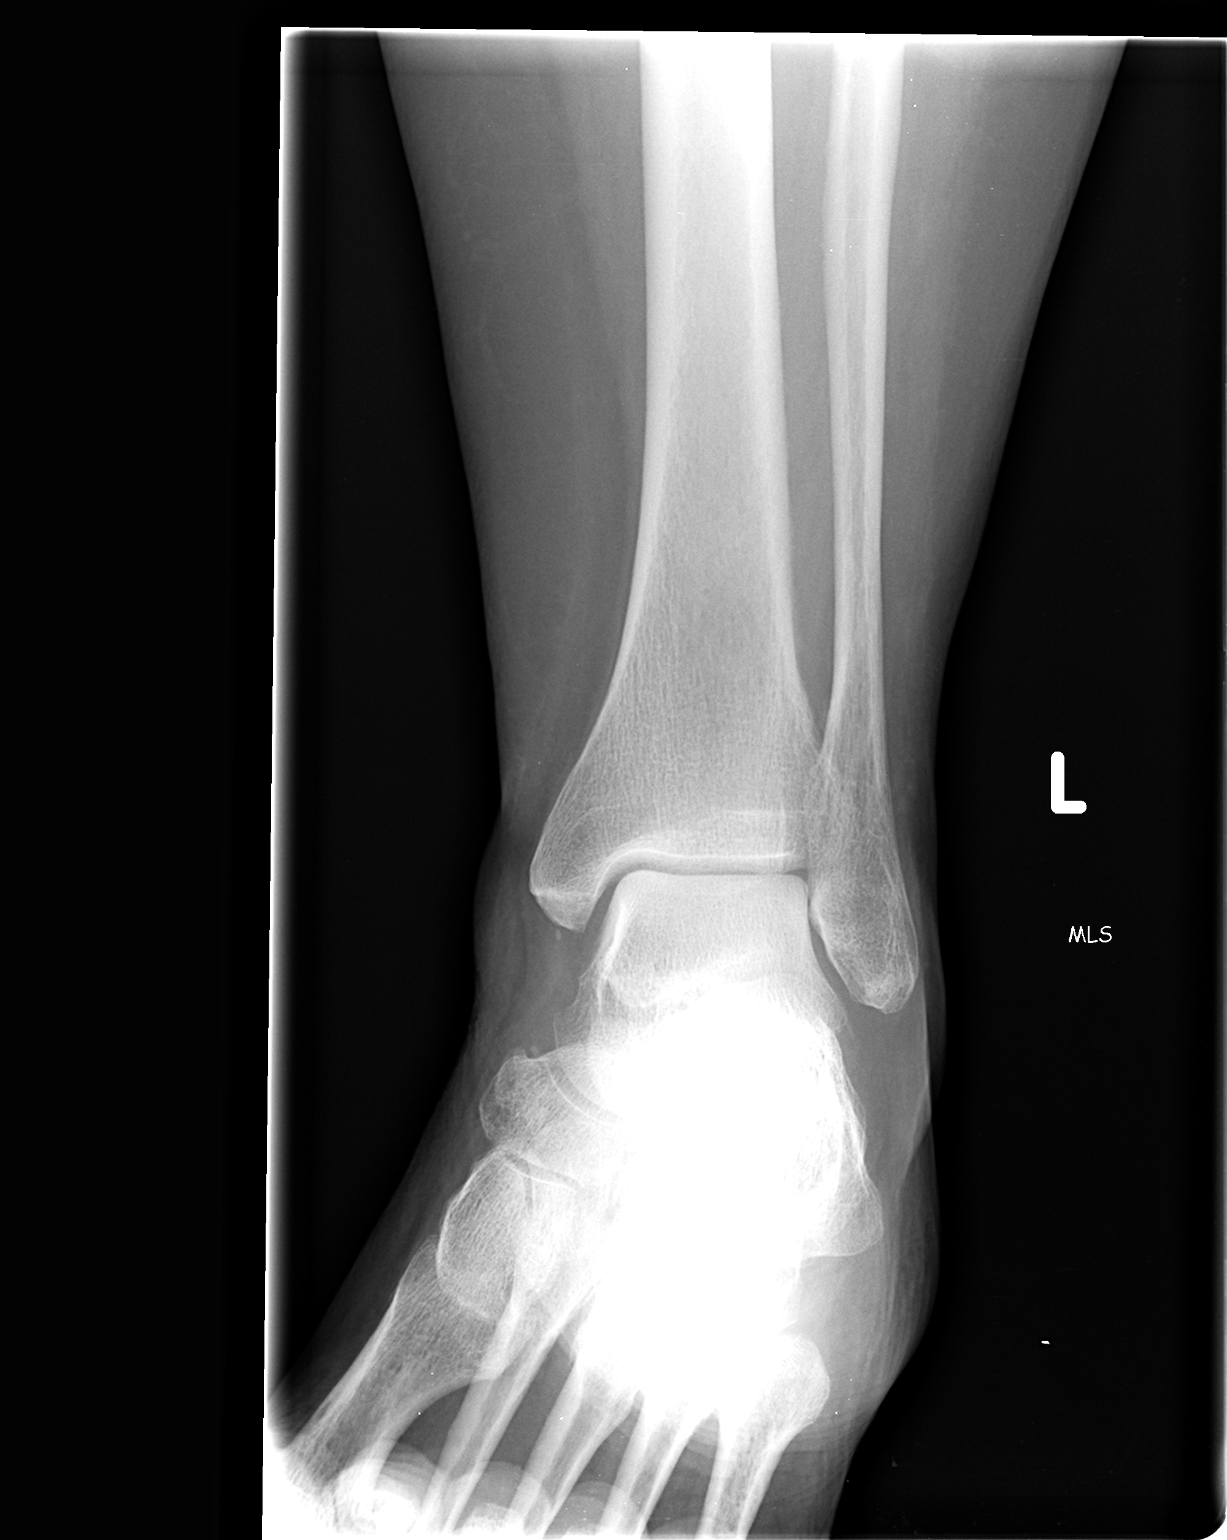

[view not recorded (3 of 3)]
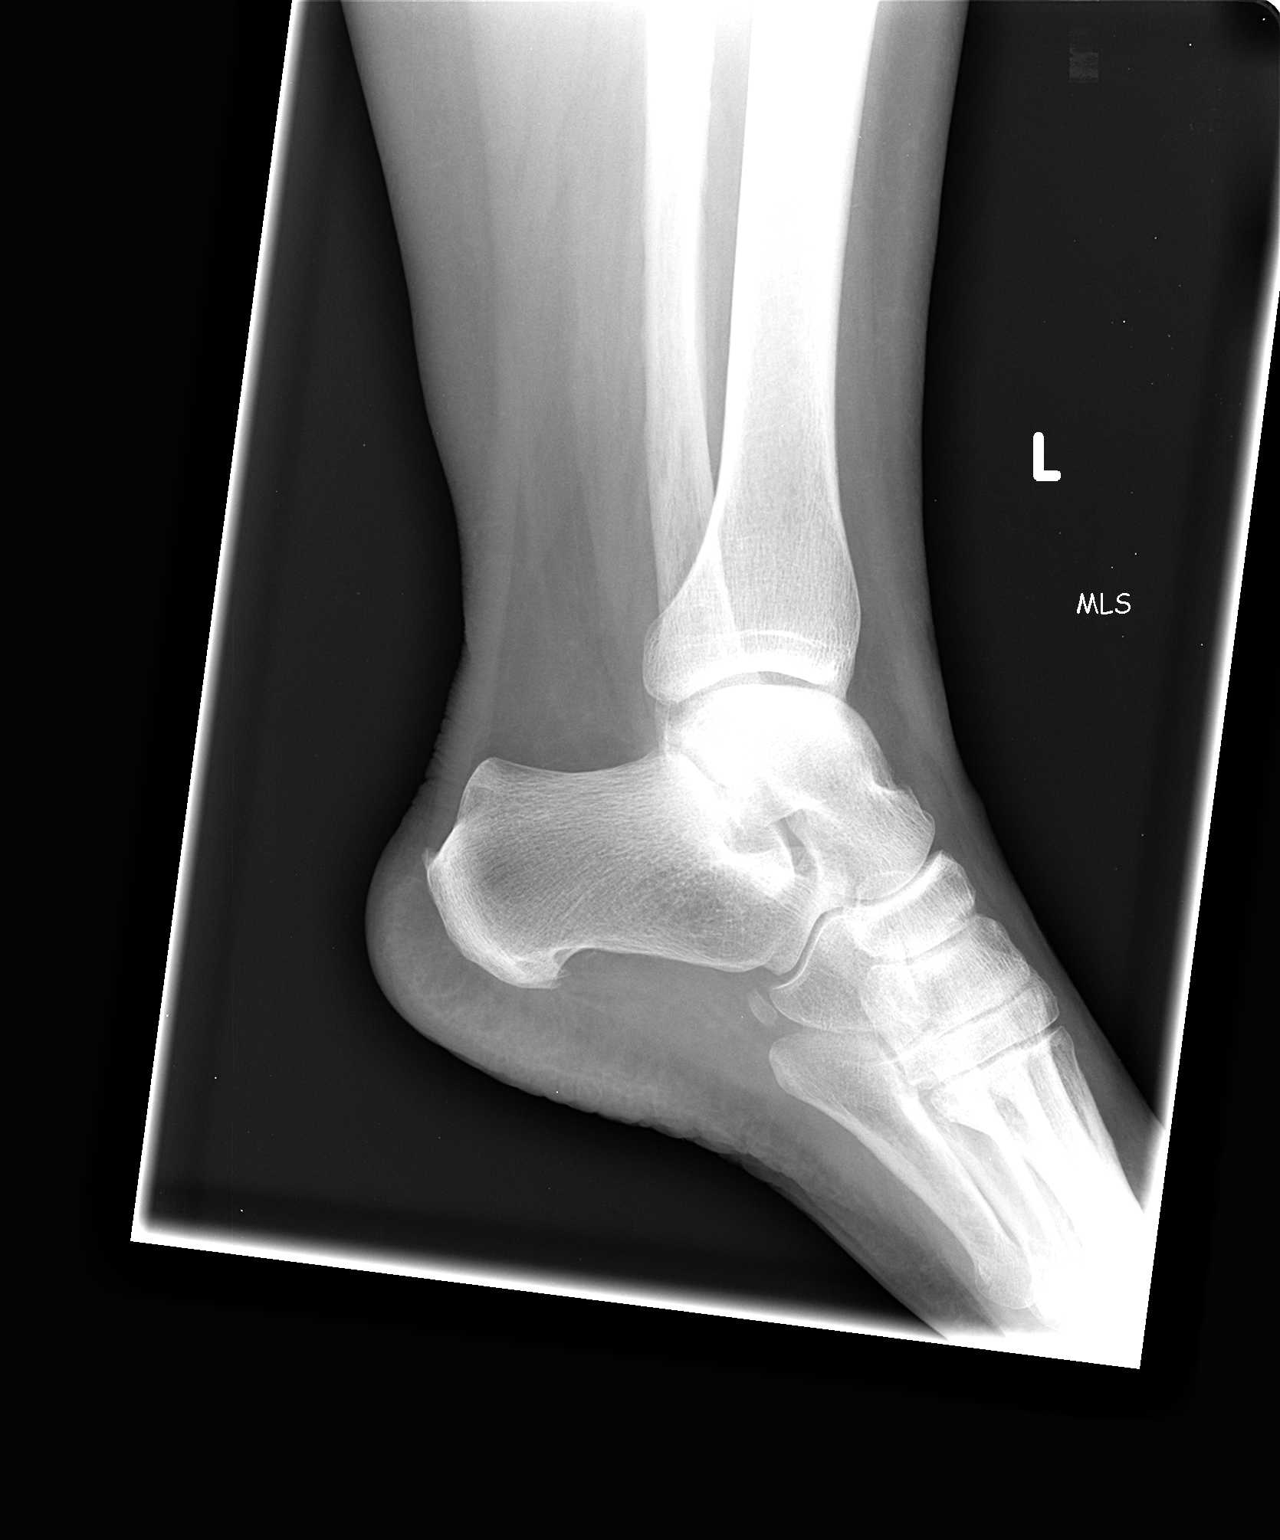

[3 of 3 positions shown; findings below may reference images not displayed]

FINDINGS: Small calcaneal spurs. Negative for fracture,
dislocation, or other acute abnormality.  Normal alignment and
mineralization. No significant degenerative change.  Regional soft
tissues unremarkable.
IMPRESSION: Negative

## 2010-03-21 IMAGING — CR DG HIP W/ PELVIS BILAT
3 series · 3 of 3 positions shown · non-contrast
Comparison: None.

CLINICAL DATA: Pain post fall

BILATERAL HIP WITH PELVIS - 4+ VIEW

[view not recorded (1 of 3)]
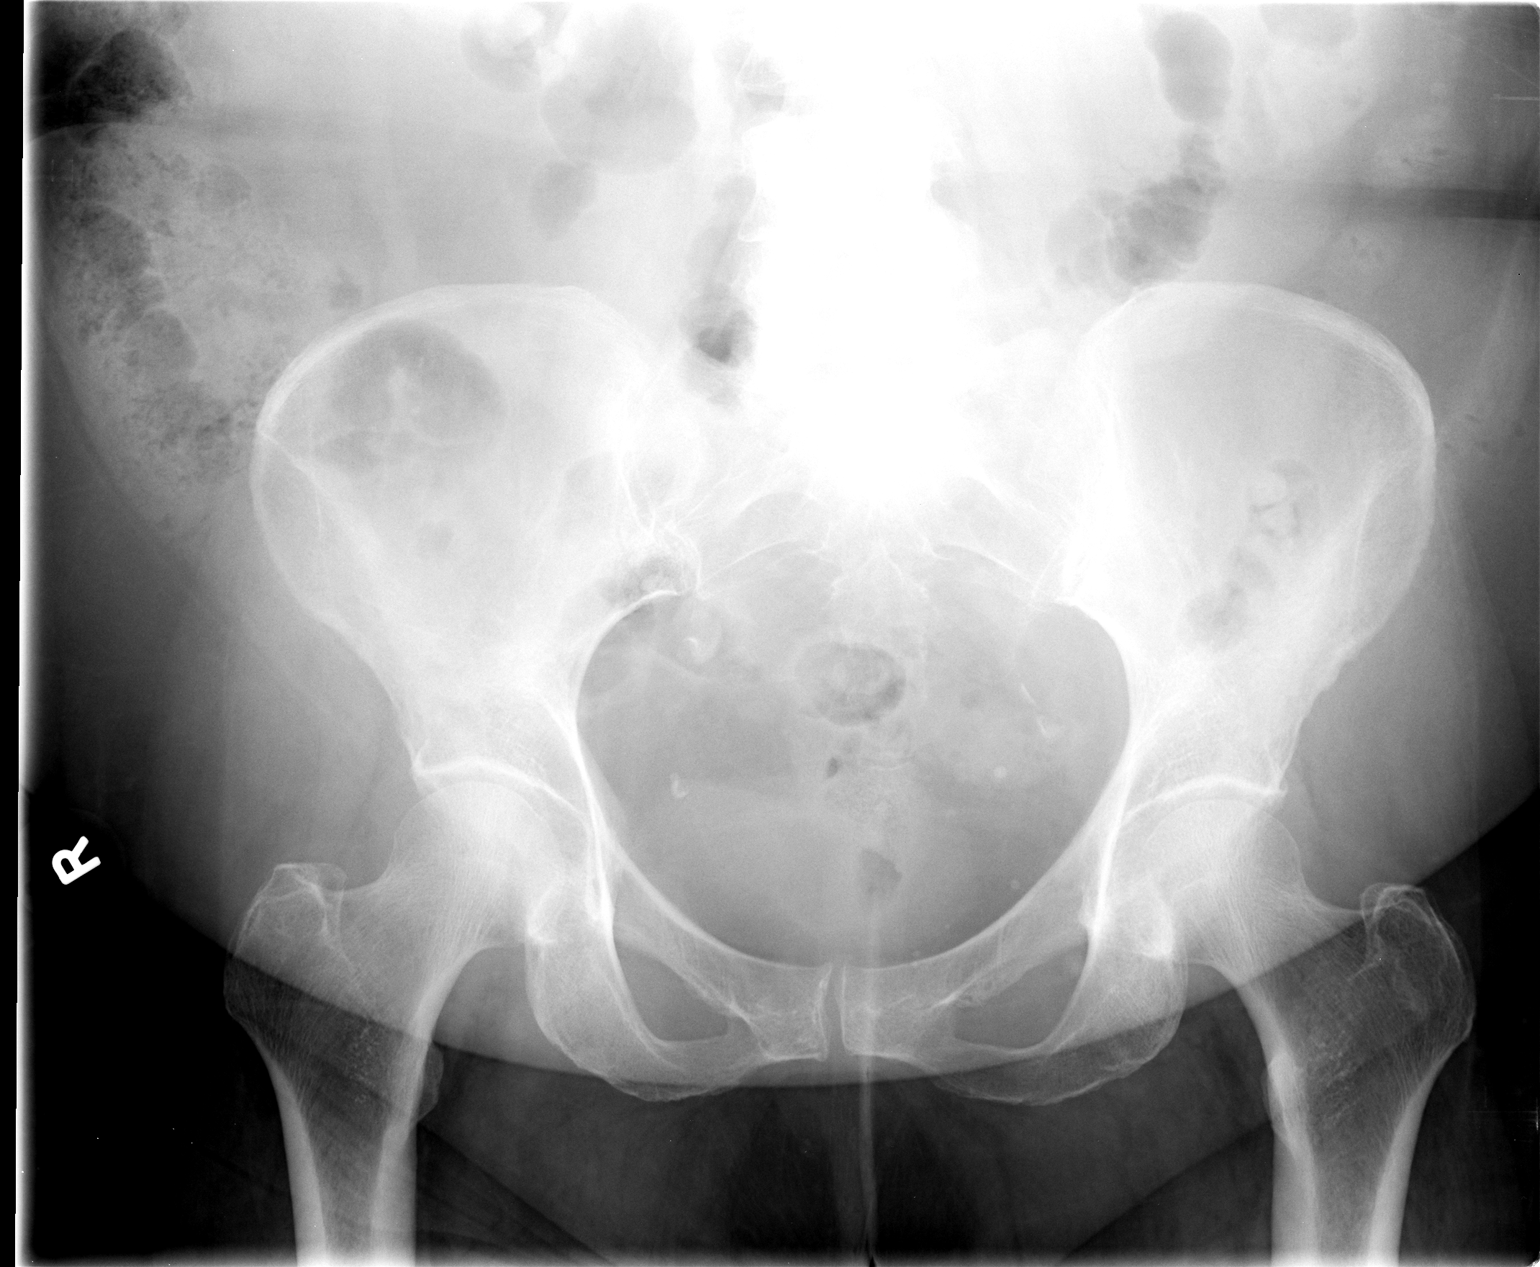

[view not recorded (2 of 3)]
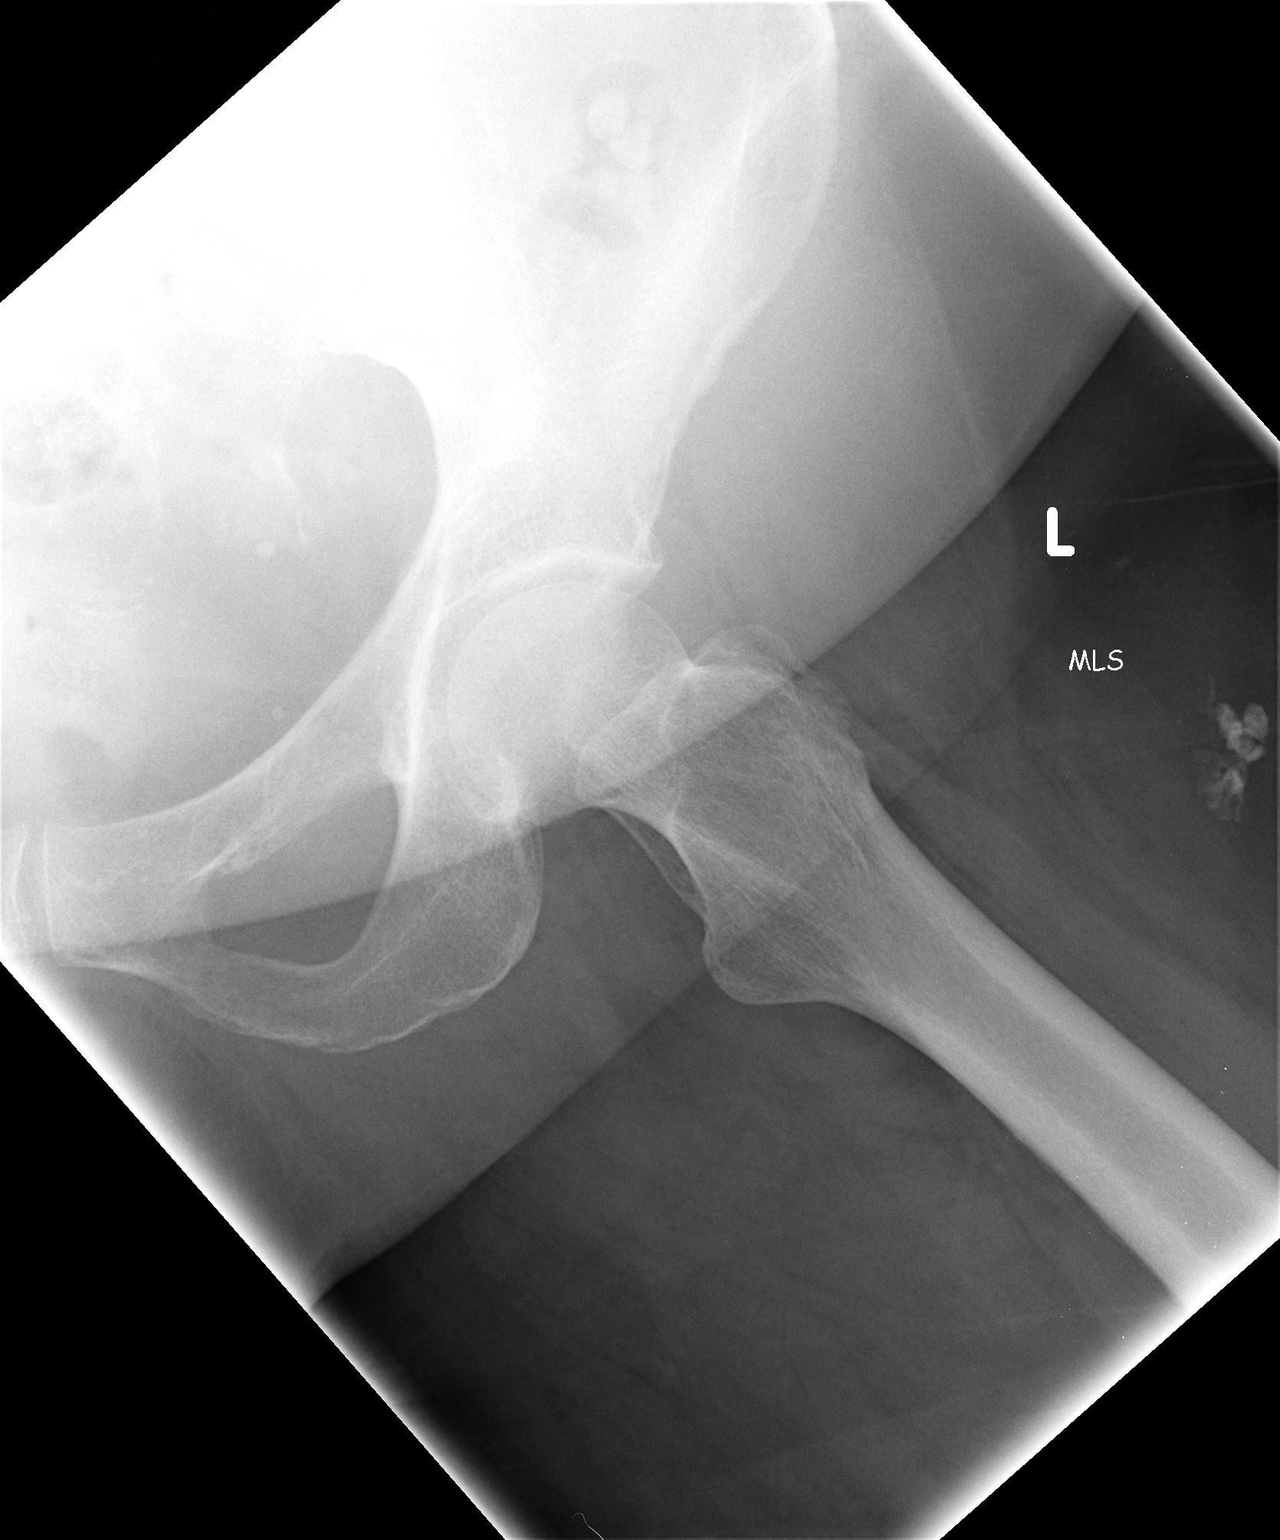

[view not recorded (3 of 3)]
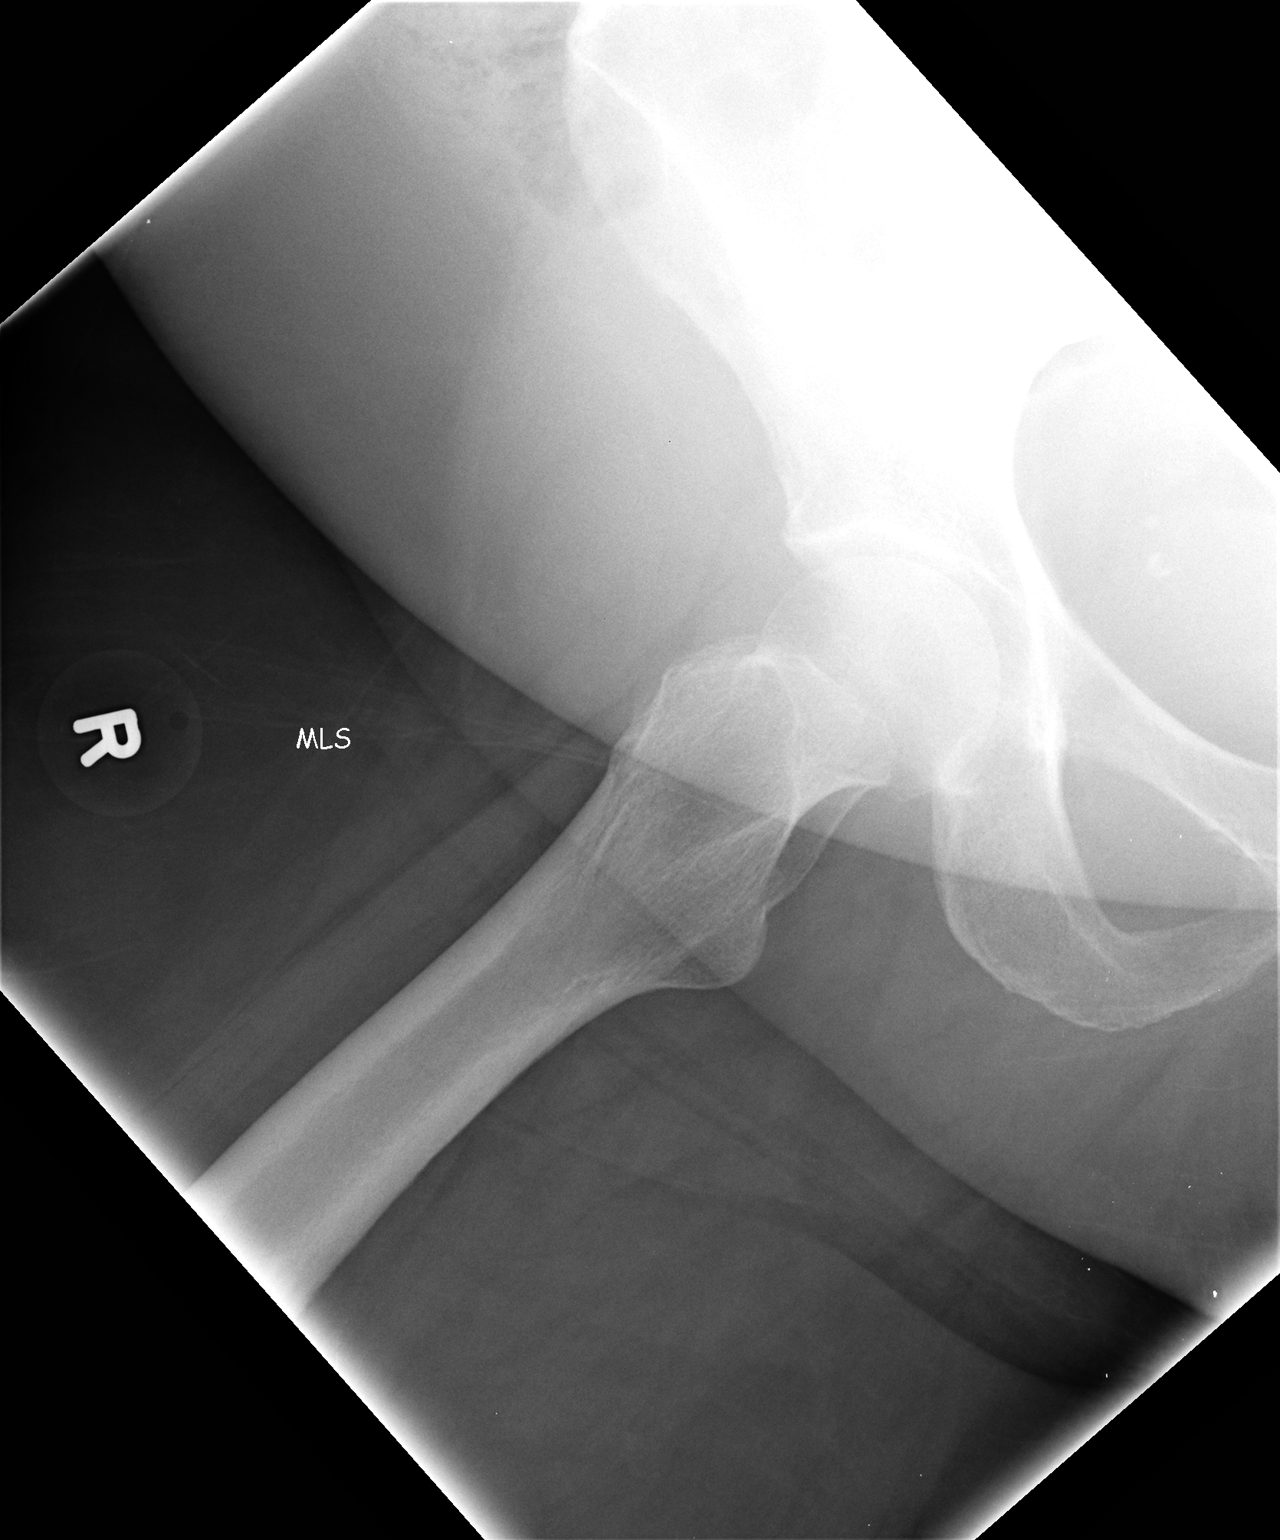

[3 of 3 positions shown; findings below may reference images not displayed]

FINDINGS: Bilateral pelvic vascular calcifications. Negative for
fracture, dislocation, or other acute abnormality.  Normal
alignment and mineralization. No significant degenerative change.
Regional soft tissues unremarkable.
IMPRESSION: Negative

## 2010-03-21 IMAGING — CT CT CERVICAL SPINE W/O CM
3 of 5 series · 11 of 33 positions shown, 13 images · non-contrast
Comparison: None

CT HEAD

CLINICAL DATA: *Pain *Trauma (Fall) - Pain.

CT HEAD WITHOUT CONTRAST
CT CERVICAL SPINE WITHOUT CONTRAST
TECHNIQUE: Multidetector CT imaging of the head and cervical spine
was performed following the standard protocol without IV contrast.
Multiplanar CT image reconstructions of the cervical spine were
also generated.

[Series 2: headseq 4.8 h37s · axial · 0.47mm/px · z∈[+259,+446]mm · 3 of 36 slices shown, 4 images]
[im 1/36  soft-tissue]
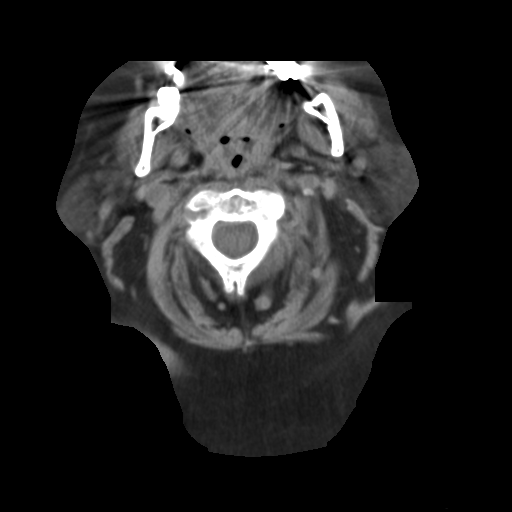
[im 1/36  bone]
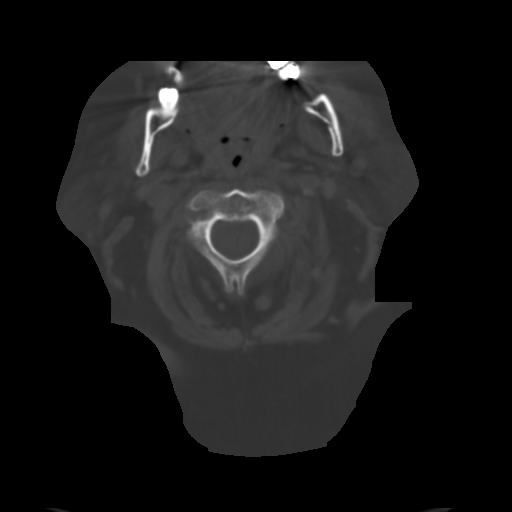
[im 18/36  bone]
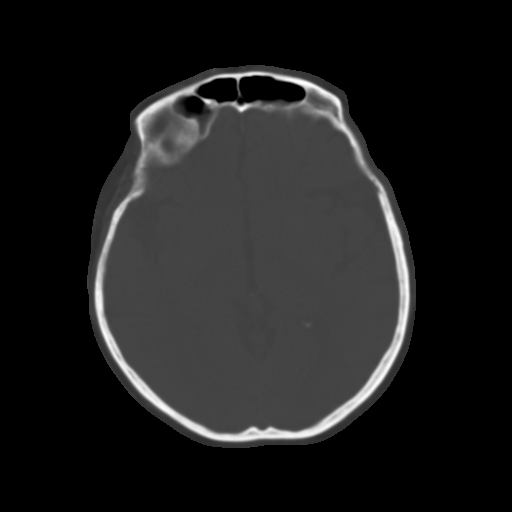
[im 36/36  bone]
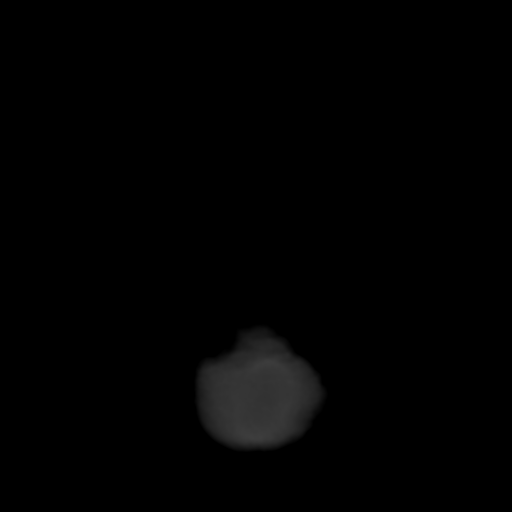

[Series 7: sagittal bone 2.0 · sagittal · 0.21mm/px · 5 of 33 slices shown, 6 images]
[im 11/33  bone]
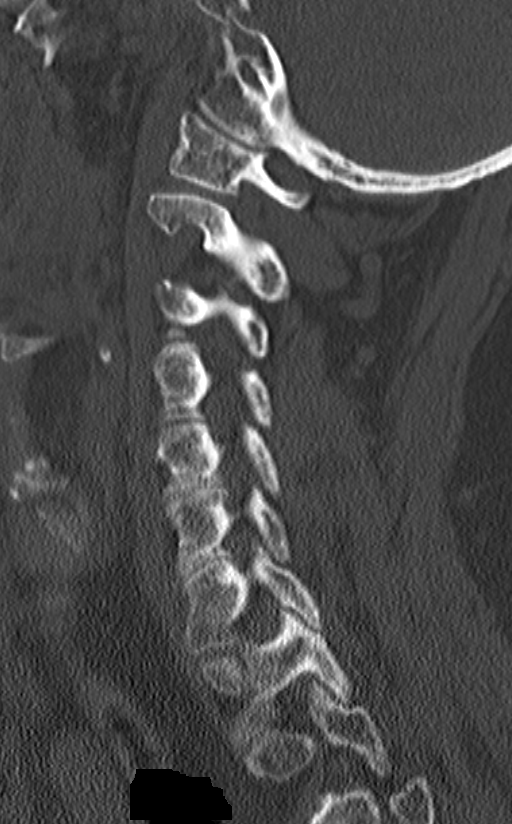
[im 14/33  bone]
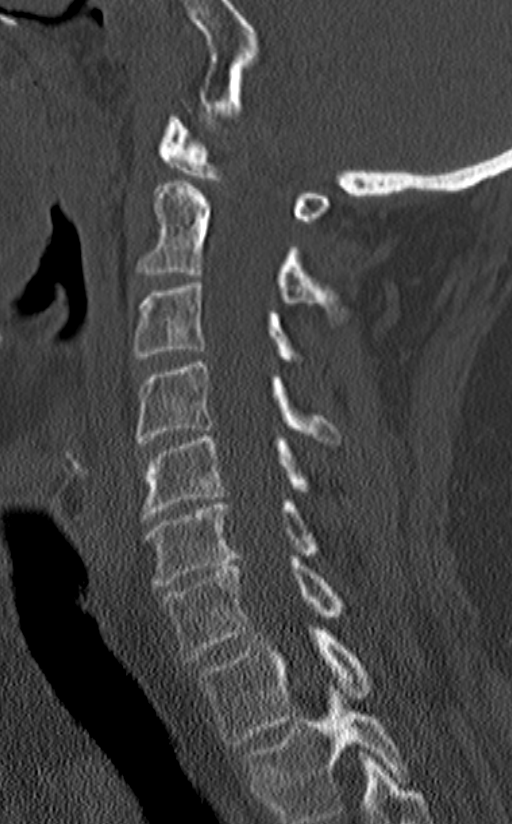
[im 17/33  soft-tissue]
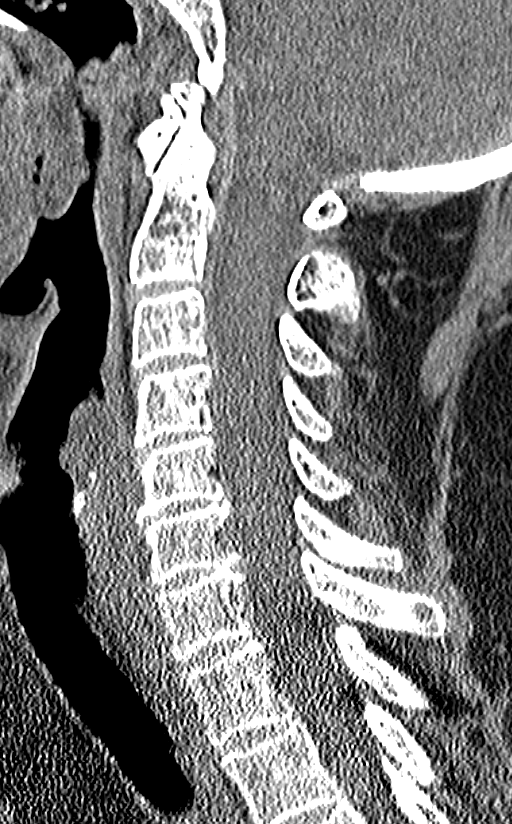
[im 17/33  bone]
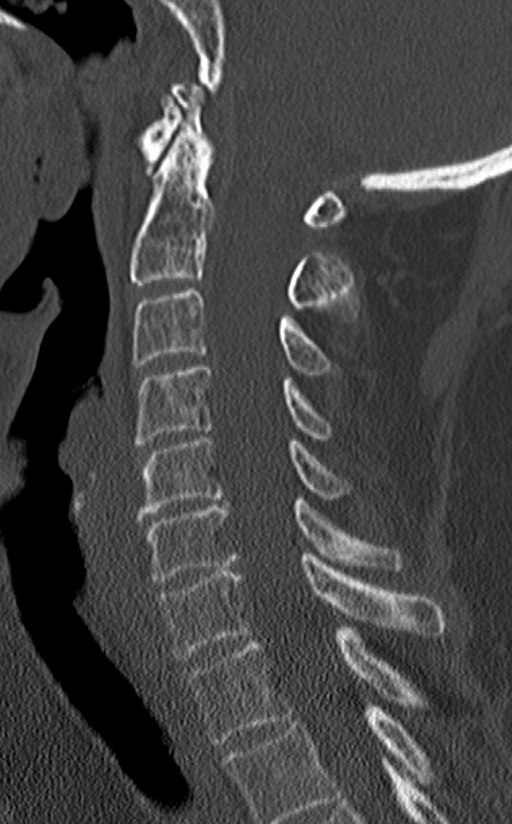
[im 19/33  bone]
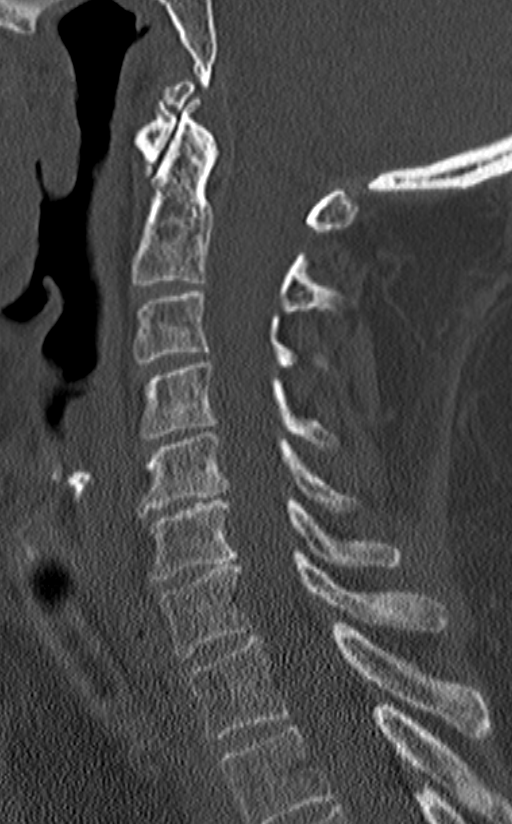
[im 22/33  bone]
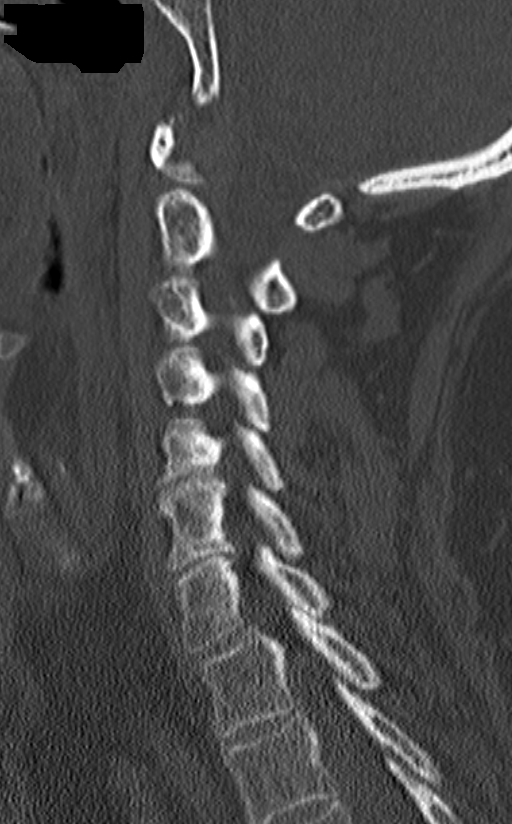

[Series 8: coronal bone 2.0 · coronal · 0.19mm/px · 3 of 31 slices shown]
[im 7/31  bone]
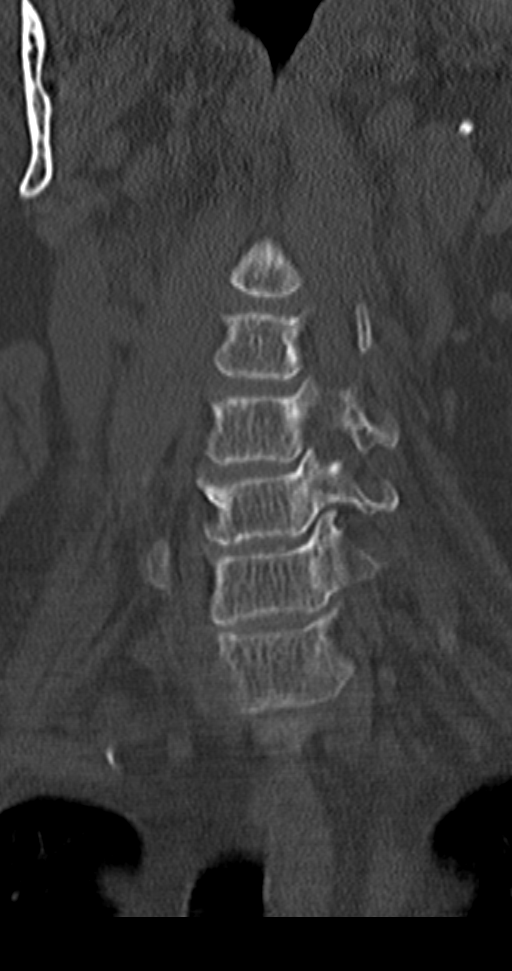
[im 13/31  bone]
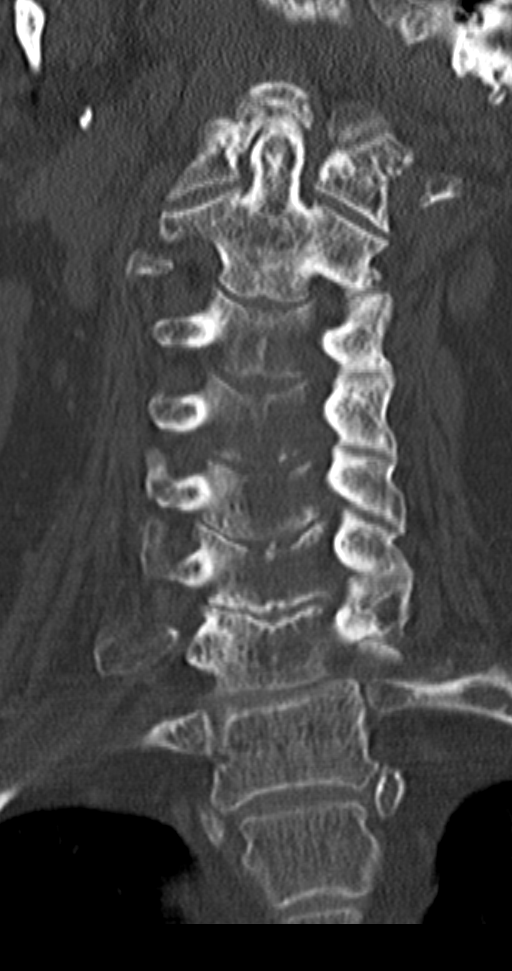
[im 19/31  bone]
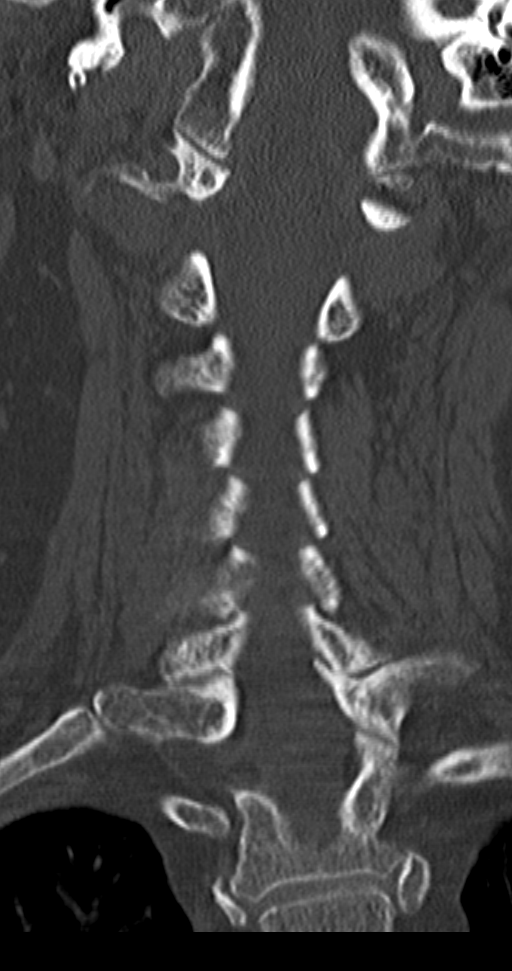

[11 of 33 positions shown; findings below may reference images not displayed]

FINDINGS: Atherosclerotic and physiologic intracranial
calcifications. Diffuse parenchymal atrophy. Patchy areas of
hypoattenuation in deep and periventricular white matter
bilaterally. Negative for acute intracranial hemorrhage, mass
lesion, acute infarction, midline shift, or mass-effect. Acute
infarct may be inapparent on noncontrast CT. Ventricles and sulci
symmetric. Bone windows demonstrate no focal lesion.
IMPRESSION: 1. Negative for bleed or other acute intracranial process.

2. Atrophy and nonspecific white matter changes

CT CERVICAL SPINE
FINDINGS: Normal alignment.  No prevertebral soft tissue swelling.
Mild narrowing of the C4-5, C5-6, and C6-7 interspaces with
associated endplate spurring.  Negative for fracture.  Facets
seated.

Visualized lung apices unremarkable.  There is bilateral partially
calcified carotid bifurcation plaque.
IMPRESSION: 1.  Negative for fracture or other acute bone injury.
2.  Mild degenerative changes C4-C7 as above.
3.  Bilateral carotid bifurcation plaque.

## 2010-03-29 LAB — GLUCOSE, CAPILLARY
Glucose-Capillary: 129 mg/dL — ABNORMAL HIGH (ref 70–99)
Glucose-Capillary: 146 mg/dL — ABNORMAL HIGH (ref 70–99)

## 2010-12-29 ENCOUNTER — Encounter: Payer: Self-pay | Admitting: *Deleted

## 2010-12-29 ENCOUNTER — Emergency Department (HOSPITAL_COMMUNITY): Payer: Non-veteran care

## 2010-12-29 ENCOUNTER — Inpatient Hospital Stay (HOSPITAL_COMMUNITY)
Admission: EM | Admit: 2010-12-29 | Discharge: 2011-01-06 | DRG: 389 | Disposition: A | Discharge status: Home / Self Care | Payer: Non-veteran care | Attending: General Surgery | Admitting: General Surgery

## 2010-12-29 DIAGNOSIS — M353 Polymyalgia rheumatica: Secondary | ICD-10-CM | POA: Diagnosis present

## 2010-12-29 DIAGNOSIS — E870 Hyperosmolality and hypernatremia: Secondary | ICD-10-CM | POA: Diagnosis not present

## 2010-12-29 DIAGNOSIS — F19921 Other psychoactive substance use, unspecified with intoxication with delirium: Secondary | ICD-10-CM | POA: Diagnosis not present

## 2010-12-29 DIAGNOSIS — T40605A Adverse effect of unspecified narcotics, initial encounter: Secondary | ICD-10-CM | POA: Diagnosis not present

## 2010-12-29 DIAGNOSIS — E0500 Thyrotoxicosis with diffuse goiter without thyrotoxic crisis or storm: Secondary | ICD-10-CM | POA: Diagnosis present

## 2010-12-29 DIAGNOSIS — K56609 Unspecified intestinal obstruction, unspecified as to partial versus complete obstruction: Secondary | ICD-10-CM

## 2010-12-29 DIAGNOSIS — K219 Gastro-esophageal reflux disease without esophagitis: Secondary | ICD-10-CM | POA: Diagnosis present

## 2010-12-29 DIAGNOSIS — D72829 Elevated white blood cell count, unspecified: Secondary | ICD-10-CM

## 2010-12-29 DIAGNOSIS — G4700 Insomnia, unspecified: Secondary | ICD-10-CM | POA: Diagnosis present

## 2010-12-29 DIAGNOSIS — T424X5A Adverse effect of benzodiazepines, initial encounter: Secondary | ICD-10-CM | POA: Diagnosis not present

## 2010-12-29 DIAGNOSIS — K560 Paralytic ileus: Principal | ICD-10-CM | POA: Diagnosis present

## 2010-12-29 DIAGNOSIS — F411 Generalized anxiety disorder: Secondary | ICD-10-CM | POA: Diagnosis present

## 2010-12-29 DIAGNOSIS — K439 Ventral hernia without obstruction or gangrene: Secondary | ICD-10-CM | POA: Diagnosis present

## 2010-12-29 DIAGNOSIS — D7589 Other specified diseases of blood and blood-forming organs: Secondary | ICD-10-CM | POA: Diagnosis present

## 2010-12-29 HISTORY — DX: Hyperlipidemia, unspecified: E78.5

## 2010-12-29 HISTORY — DX: Palpitations: R00.2

## 2010-12-29 HISTORY — DX: Gastro-esophageal reflux disease without esophagitis: K21.9

## 2010-12-29 HISTORY — DX: Thyrotoxicosis with diffuse goiter without thyrotoxic crisis or storm: E05.00

## 2010-12-29 HISTORY — DX: Fibromyalgia: M79.7

## 2010-12-29 HISTORY — DX: Polymyalgia rheumatica: M35.3

## 2010-12-29 HISTORY — DX: Anxiety disorder, unspecified: F41.9

## 2010-12-29 HISTORY — DX: Herpesviral infection, unspecified: B00.9

## 2010-12-29 LAB — URINALYSIS, ROUTINE W REFLEX MICROSCOPIC
Glucose, UA: NEGATIVE mg/dL
Hgb urine dipstick: NEGATIVE
Ketones, ur: 15 mg/dL — AB
Leukocytes, UA: NEGATIVE
Nitrite: NEGATIVE
Protein, ur: 30 mg/dL — AB
Specific Gravity, Urine: 1.03 (ref 1.005–1.030)
Urobilinogen, UA: 0.2 mg/dL (ref 0.0–1.0)
pH: 6 (ref 5.0–8.0)

## 2010-12-29 LAB — CBC
HCT: 39.7 % (ref 36.0–46.0)
Hemoglobin: 12.7 g/dL (ref 12.0–15.0)
MCH: 31.5 pg (ref 26.0–34.0)
MCHC: 32 g/dL (ref 30.0–36.0)
MCV: 98.5 fL (ref 78.0–100.0)
Platelets: 285 10*3/uL (ref 150–400)
RBC: 4.03 MIL/uL (ref 3.87–5.11)
RDW: 13.4 % (ref 11.5–15.5)
WBC: 13.8 10*3/uL — ABNORMAL HIGH (ref 4.0–10.5)

## 2010-12-29 LAB — DIFFERENTIAL
Basophils Absolute: 0 10*3/uL (ref 0.0–0.1)
Basophils Relative: 0 % (ref 0–1)
Eosinophils Absolute: 0.1 10*3/uL (ref 0.0–0.7)
Eosinophils Relative: 1 % (ref 0–5)
Lymphocytes Relative: 10 % — ABNORMAL LOW (ref 12–46)
Lymphs Abs: 1.3 10*3/uL (ref 0.7–4.0)
Monocytes Absolute: 0.9 10*3/uL (ref 0.1–1.0)
Monocytes Relative: 7 % (ref 3–12)
Neutro Abs: 11.5 10*3/uL — ABNORMAL HIGH (ref 1.7–7.7)
Neutrophils Relative %: 83 % — ABNORMAL HIGH (ref 43–77)

## 2010-12-29 LAB — BASIC METABOLIC PANEL
BUN: 16 mg/dL (ref 6–23)
CO2: 33 mEq/L — ABNORMAL HIGH (ref 19–32)
Calcium: 9.5 mg/dL (ref 8.4–10.5)
Chloride: 104 mEq/L (ref 96–112)
Creatinine, Ser: 1.37 mg/dL — ABNORMAL HIGH (ref 0.50–1.10)
GFR calc Af Amer: 43 mL/min — ABNORMAL LOW (ref >90)
GFR calc non Af Amer: 38 mL/min — ABNORMAL LOW (ref >90)
Glucose, Bld: 109 mg/dL — ABNORMAL HIGH (ref 70–99)
Potassium: 3.9 mEq/L (ref 3.5–5.1)
Sodium: 145 mEq/L (ref 135–145)

## 2010-12-29 LAB — URINE MICROSCOPIC-ADD ON

## 2010-12-29 LAB — OCCULT BLOOD, POC DEVICE: Fecal Occult Bld: NEGATIVE

## 2010-12-29 IMAGING — CT CT ABD-PELV W/O CM
2 of 4 series · 17 of 46 positions shown, 19 images · non-contrast
Comparison: None.

CLINICAL DATA: Abdominal pain, diffuse tenderness to palpation,
status post cholecystectomy and appendectomy.

CT ABDOMEN AND PELVIS WITHOUT CONTRAST
TECHNIQUE: Multidetector CT imaging of the abdomen and pelvis was
performed following the standard protocol without intravenous
contrast.

[Series 2: abdomen/pelvis w/o contrast · axial · non-contrast · 0.81mm/px · z∈[-468,-68]mm · 14 of 88 slices shown, 16 images]
[im 4/88  soft-tissue]
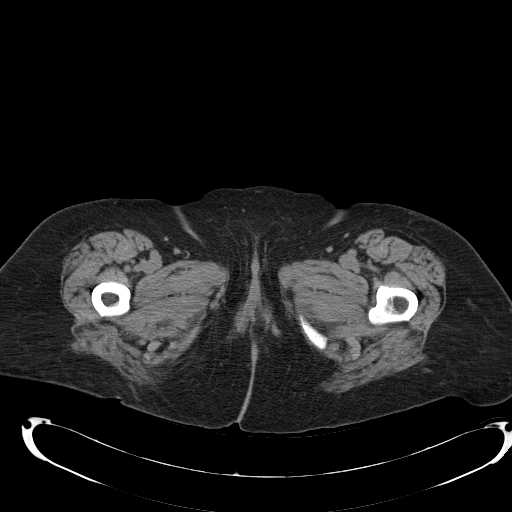
[im 4/88  bone]
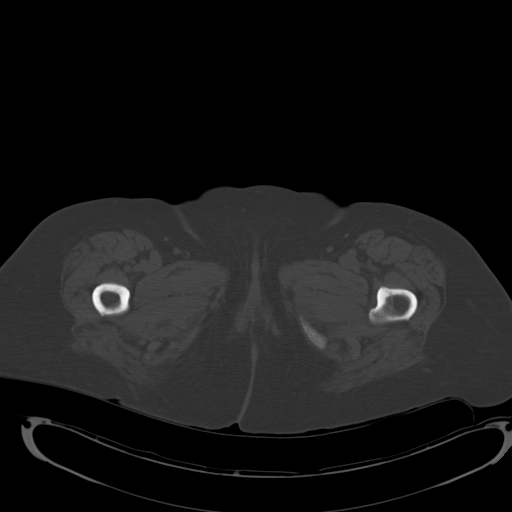
[im 11/88  soft-tissue]
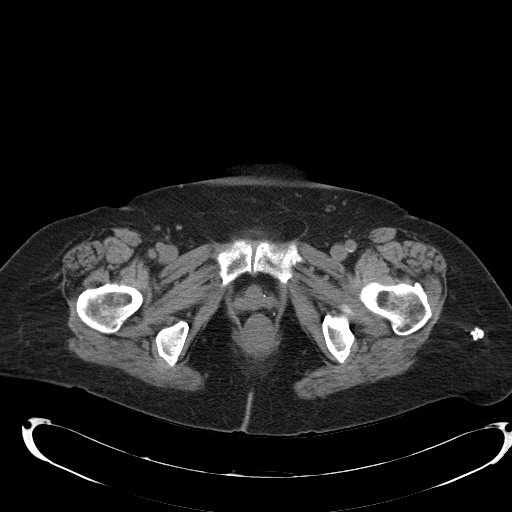
[im 19/88  soft-tissue]
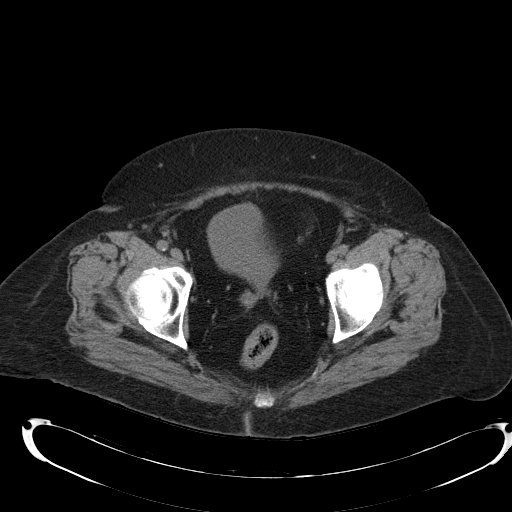
[im 22/88  soft-tissue]
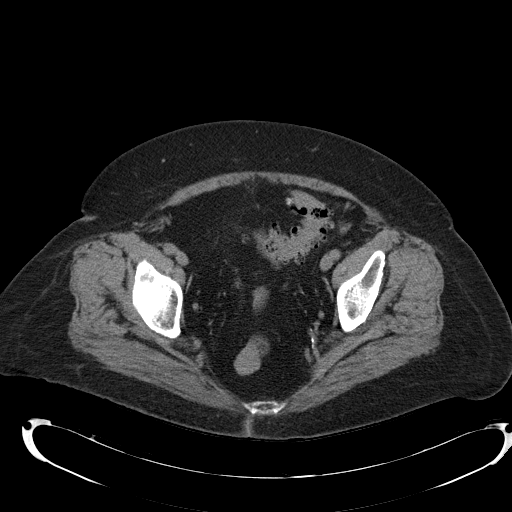
[im 30/88  soft-tissue]
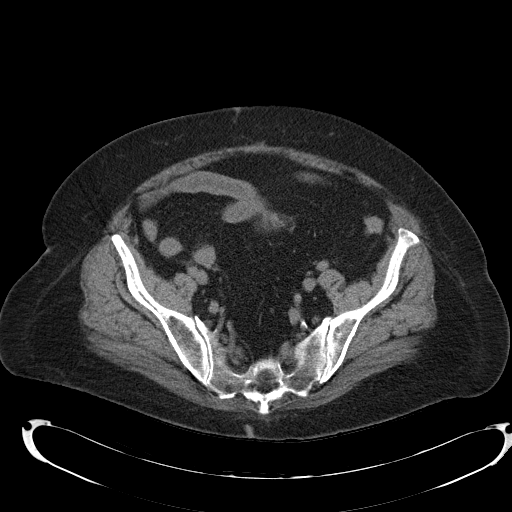
[im 37/88  soft-tissue]
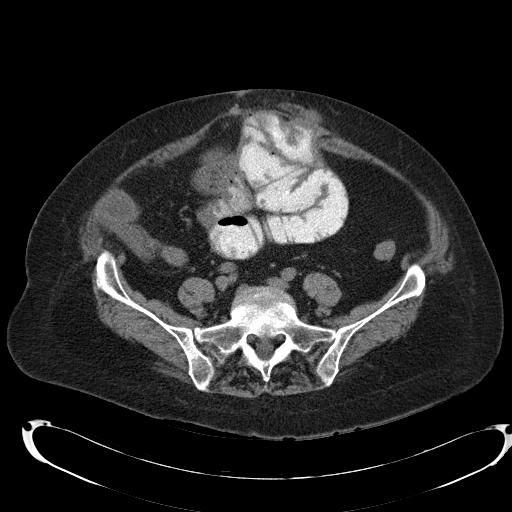
[im 40/88  soft-tissue]
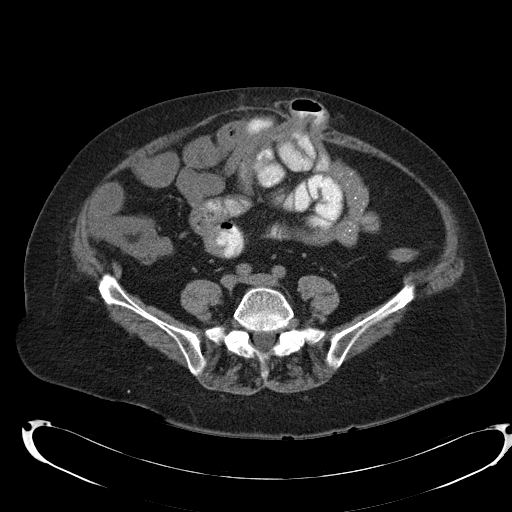
[im 48/88  soft-tissue]
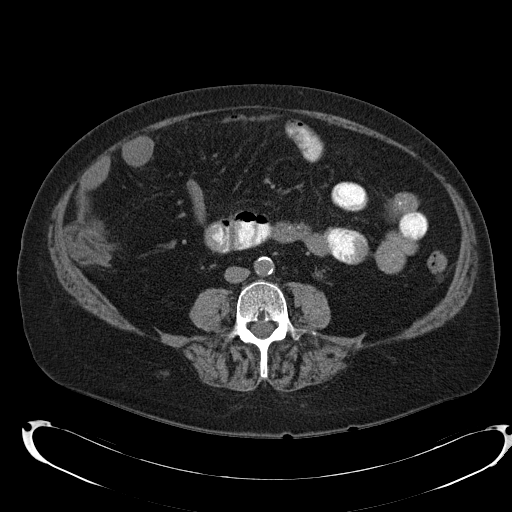
[im 51/88  soft-tissue]
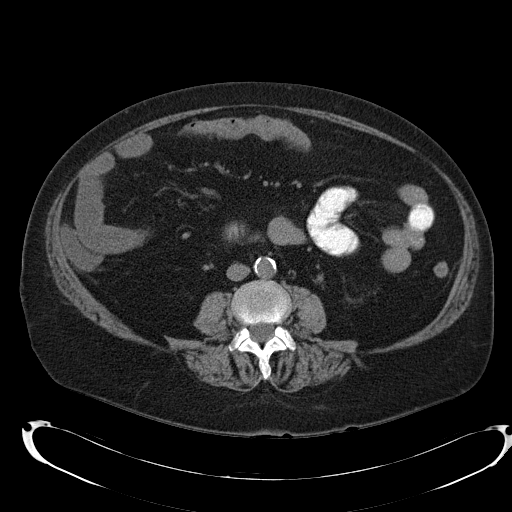
[im 51/88  bone]
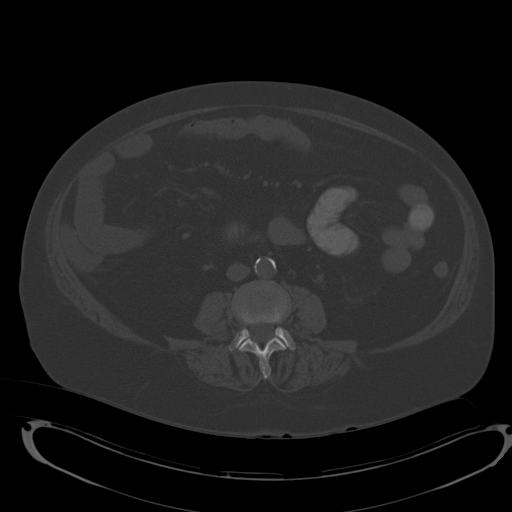
[im 59/88  soft-tissue]
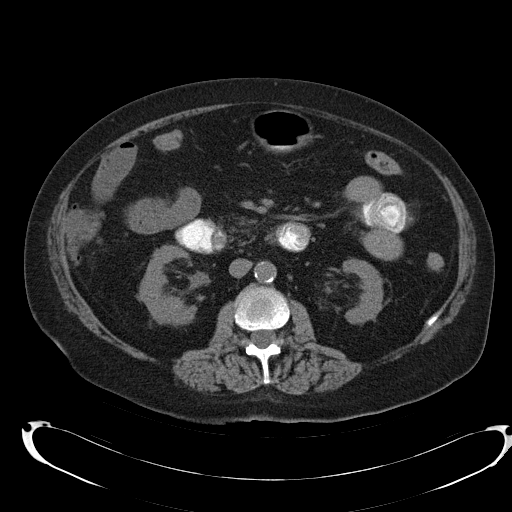
[im 66/88  soft-tissue]
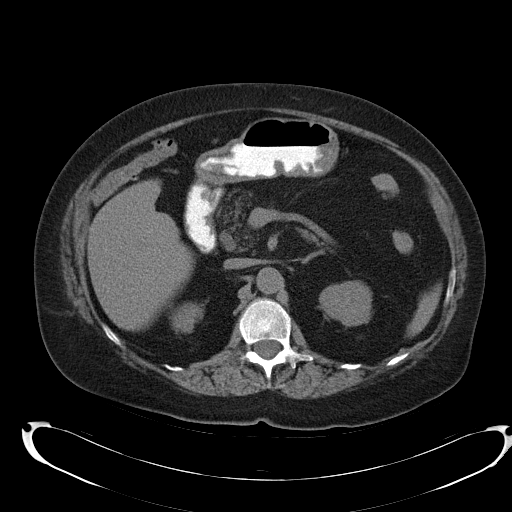
[im 69/88  soft-tissue]
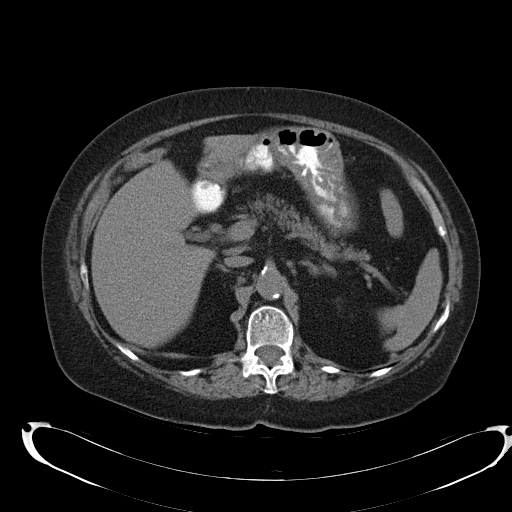
[im 77/88  soft-tissue]
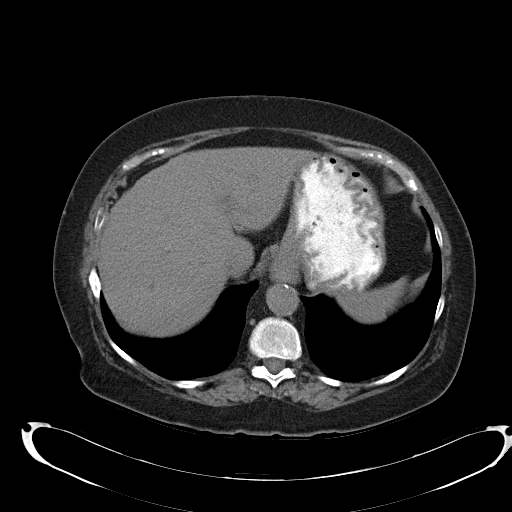
[im 84/88  soft-tissue]
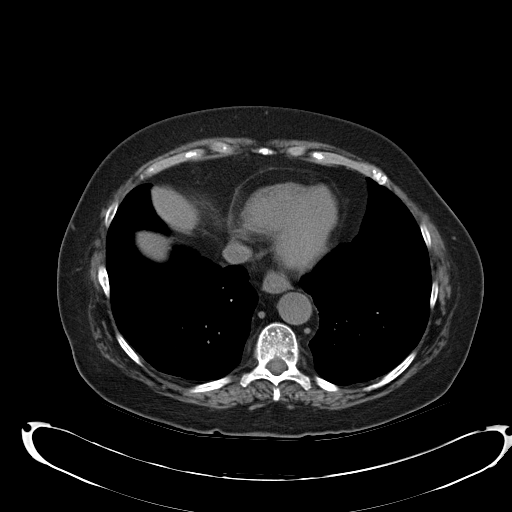

[Series 4: mpr cor (id) · coronal · 0.86mm/px · 3 of 87 slices shown]
[im 29/87  soft-tissue]
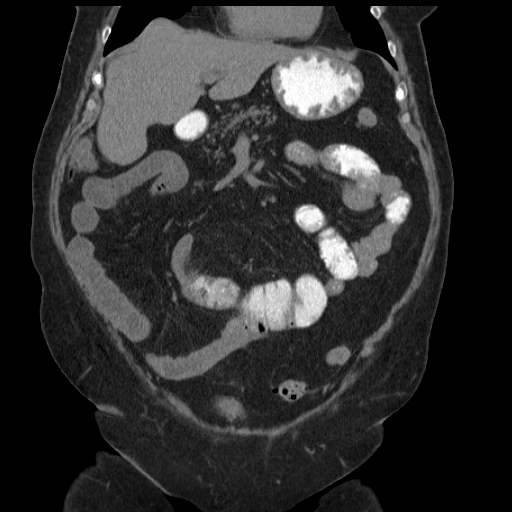
[im 39/87  soft-tissue]
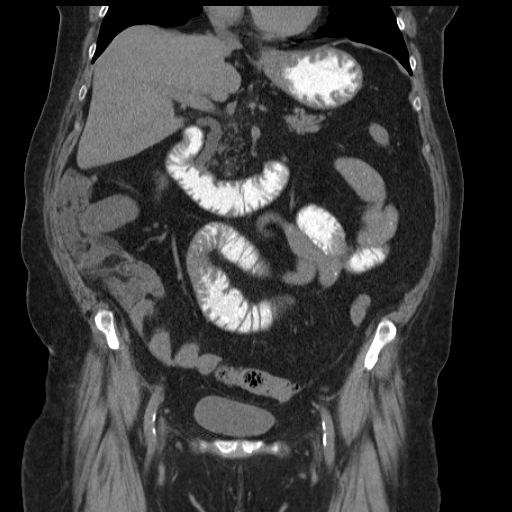
[im 48/87  soft-tissue]
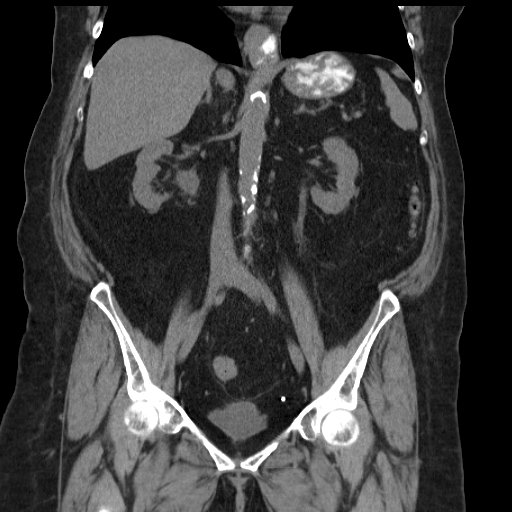

[17 of 46 positions shown; findings below may reference images not displayed]

FINDINGS: Lung bases are clear.

Small hiatal hernia.

Unenhanced liver, spleen, pancreas, and adrenal glands are within
normal limits.

Status post cholecystectomy.  No intrahepatic or extrahepatic
ductal dilatation.

Kidneys are unremarkable.  No renal calculi or hydronephrosis.

Mildly prominent loops of mid small bowel leading to a left
paramidline ventral hernia (series 2/image 50).  Distal small bowel
is relatively decompressed, suggesting some degree of obstruction,
however contrast does pass distally (series 2/image 49).  No
diverticulosis, without associated inflammatory changes.

Atherosclerotic calcifications of the abdominal aorta and branch
vessels.

No abdominopelvic ascites.

No suspicious abdominopelvic lymphadenopathy.

Status post hysterectomy.  No adnexal masses.

No ureteral or bladder calculi.  Bladder is within normal limits.

Mild degenerative changes of the visualized thoracolumbar spine.
IMPRESSION: Suspected partial small bowel obstruction secondary to a left
paramidline ventral hernia.

Findings discussed with and acknowledged by Dr. GOVERNATORI
in the ED on [DATE]/ [PL] at [PL] hours.

## 2010-12-29 MED ORDER — ONDANSETRON HCL 4 MG/2ML IJ SOLN
4.0000 mg | Freq: Four times a day (QID) | INTRAMUSCULAR | Status: DC | PRN
  Administered 2010-12-30 – 2011-01-01 (×2): 4 mg via INTRAVENOUS
  Filled 2010-12-29 (×2): qty 2

## 2010-12-29 MED ORDER — MORPHINE SULFATE 2 MG/ML IJ SOLN
1.0000 mg | INTRAMUSCULAR | Status: DC | PRN
  Administered 2010-12-29 – 2010-12-30 (×2): 2 mg via INTRAVENOUS
  Filled 2010-12-29 (×2): qty 1

## 2010-12-29 MED ORDER — HYOSCYAMINE SULFATE 0.125 MG SL SUBL
0.2500 mg | SUBLINGUAL_TABLET | Freq: Once | SUBLINGUAL | Status: AC
  Administered 2010-12-29: 0.25 mg via SUBLINGUAL
  Filled 2010-12-29: qty 2

## 2010-12-29 MED ORDER — MORPHINE SULFATE 4 MG/ML IJ SOLN
4.0000 mg | Freq: Once | INTRAMUSCULAR | Status: AC
  Administered 2010-12-29: 4 mg via INTRAVENOUS
  Filled 2010-12-29: qty 1

## 2010-12-29 MED ORDER — KCL IN DEXTROSE-NACL 20-5-0.9 MEQ/L-%-% IV SOLN
INTRAVENOUS | Status: DC
  Administered 2010-12-29: 1000 mL via INTRAVENOUS
  Administered 2010-12-30 (×2): 950 mL via INTRAVENOUS
  Administered 2010-12-31: 1000 mL via INTRAVENOUS
  Administered 2010-12-31: 14:00:00 via INTRAVENOUS

## 2010-12-29 MED ORDER — PANTOPRAZOLE SODIUM 40 MG IV SOLR
40.0000 mg | INTRAVENOUS | Status: DC
  Administered 2010-12-29 – 2011-01-05 (×7): 40 mg via INTRAVENOUS
  Filled 2010-12-29 (×8): qty 40

## 2010-12-29 MED ORDER — SODIUM CHLORIDE 0.9 % IJ SOLN
INTRAMUSCULAR | Status: AC
  Administered 2010-12-29: 10 mL
  Filled 2010-12-29: qty 10

## 2010-12-29 MED ORDER — HYOSCYAMINE SULFATE 0.125 MG PO TABS
ORAL_TABLET | ORAL | Status: AC
  Filled 2010-12-29: qty 2

## 2010-12-29 MED ORDER — ONDANSETRON HCL 4 MG/2ML IJ SOLN
4.0000 mg | Freq: Once | INTRAMUSCULAR | Status: AC
  Administered 2010-12-29: 4 mg via INTRAVENOUS
  Filled 2010-12-29: qty 2

## 2010-12-29 MED ORDER — LACTATED RINGERS IV SOLN
INTRAVENOUS | Status: DC
  Administered 2010-12-29: 18:00:00 via INTRAVENOUS

## 2010-12-29 MED ORDER — METOPROLOL TARTRATE 25 MG PO TABS
25.0000 mg | ORAL_TABLET | Freq: Two times a day (BID) | ORAL | Status: DC
  Administered 2010-12-29 – 2011-01-06 (×14): 25 mg via ORAL
  Filled 2010-12-29 (×15): qty 1

## 2010-12-29 MED ORDER — SODIUM CHLORIDE 0.9 % IV SOLN
INTRAVENOUS | Status: DC
  Administered 2010-12-29: 1000 mL via INTRAVENOUS

## 2010-12-29 MED ORDER — ENOXAPARIN SODIUM 40 MG/0.4ML ~~LOC~~ SOLN
40.0000 mg | SUBCUTANEOUS | Status: DC
  Administered 2010-12-29 – 2011-01-05 (×7): 40 mg via SUBCUTANEOUS
  Filled 2010-12-29 (×7): qty 0.4

## 2010-12-29 MED ORDER — LEVOTHYROXINE SODIUM 112 MCG PO TABS
112.0000 ug | ORAL_TABLET | Freq: Every day | ORAL | Status: DC
  Administered 2010-12-30 – 2011-01-05 (×5): 112 ug via ORAL
  Filled 2010-12-29 (×10): qty 1

## 2010-12-29 NOTE — ED Notes (Signed)
Pt states she is unable to have IV dye. States last time she had it, she almost stopped breathing. Order changed to Oral contrast only.

## 2010-12-29 NOTE — ED Provider Notes (Addendum)
History   This chart was scribed for Christina Stairs, MD by Clarita Crane. The patient was seen in room APA18/AP[facility]18 and the patient's care was started at 9:59AM.   CSN: [id number] Arrival date & time: 12/29/2010  9:44 AM   First MD Initiated Contact with Patient 12/29/10 [id number]      Chief Complaint  Patient presents with  . Emesis    N/V/D with bloody stools    (Consider location/radiation/quality/duration/timing/severity/associated sxs/prior treatment) HPI Christina Fry is a 70 y.o. female who presents to the Emergency Department complaining of constant severe nausea, vomiting and diarrhea onset several hours ago and persistent since with associated non-radiating diffuse abdominal pain, HA and fatigue. Patient states she has been vomiting approximately every 30 minutes since onset. Reports nausea and vomiting improved with administration of Zofran 4mg -IV provided by EMs en route.  Denies fever, recent abx use, recent sick contacts. Patient reports h/o abdominal surgery (appendectomy, abdominal hysterectomy), grave's disease, GERD, hyperlipidemia, fibromyalgia.  Past Medical History  Diagnosis Date  . Grave's disease   . Anxiety   . GERD (gastroesophageal reflux disease)   . Hyperlipidemia   . Herpes   . Polymyalgia rheumatica   . Fibromyalgia   . Palpitation     Past Surgical History  Procedure Date  . Appendectomy   . Abdominal hysterectomy   . Hernia repair   . Carpal tunnel release   . Total abdominal hysterectomy w/ bilateral salpingoophorectomy     No family history on file.  History  Substance Use Topics  . Smoking status: Never Smoker   . Smokeless tobacco: Not on file  . Alcohol Use: No    OB History    Grav Para Term Preterm Abortions TAB SAB Ect Mult Living                  Review of Systems 10 Systems reviewed and are negative for acute change except as noted in the HPI.  Allergies  Codeine; Morphine and related; Nitrofuran derivatives; and  Tylox  Home Medications   Current Outpatient Rx  Name Route Sig Dispense Refill  . ACYCLOVIR 200 MG PO CAPS Oral Take 400 mg by mouth 2 (two) times daily.      . ASPIRIN EC 81 MG PO TBEC Oral Take 81 mg by mouth daily.      Marland Kitchen CALCIUM CARBONATE-VITAMIN D 600-400 MG-UNIT PO TABS Oral Take 1 tablet by mouth daily.      Marland Kitchen CLONAZEPAM 0.5 MG PO TABS Oral Take 0.25 mg by mouth 3 (three) times daily as needed. Take only for significant anxiety     . DOCUSATE SODIUM 100 MG PO CAPS Oral Take 100 mg by mouth 2 (two) times daily.      Marland Kitchen FERROUS GLUCONATE 324 (38 FE) MG PO TABS Oral Take 324 mg by mouth at bedtime.      Marland Kitchen FLUCONAZOLE 150 MG PO TABS Oral Take 150 mg by mouth daily.      Marland Kitchen GABAPENTIN 100 MG PO CAPS Oral Take 200 mg by mouth at bedtime.      Marland Kitchen HYDROCODONE-ACETAMINOPHEN 10-325 MG PO TABS Oral Take 1-2 tablets by mouth 2 (two) times daily as needed. pain     . HYDROXYCHLOROQUINE SULFATE 200 MG PO TABS Oral Take 400 mg by mouth daily.      Marland Kitchen HYPROMELLOSE 2.5 % OP SOLN Both Eyes Place 1 drop into both eyes daily as needed. Dry eyes     . LEVOTHYROXINE SODIUM 112  MCG PO TABS Oral Take 112 mcg by mouth daily.      Marland Kitchen METOPROLOL TARTRATE 25 MG PO TABS Oral Take 25 mg by mouth 2 (two) times daily.      Carma Leaven M PLUS PO TABS Oral Take 1 tablet by mouth daily.      Marland Kitchen NORTRIPTYLINE HCL 25 MG PO CAPS Oral Take 25 mg by mouth at bedtime.      . OMEPRAZOLE 20 MG PO CPDR Oral Take 40 mg by mouth 2 (two) times daily.      . OXYBUTYNIN CHLORIDE 5 MG PO TABS Oral Take 5 mg by mouth daily. This is the long acting med     . PREDNISONE 1 MG PO TABS Oral Take 3 mg by mouth daily.      Marland Kitchen SIMVASTATIN 80 MG PO TABS Oral Take 40 mg by mouth at bedtime.      . ACETAMINOPHEN 325 MG PO TABS Oral Take 650 mg by mouth every 6 (six) hours as needed. pain       BP 147/104  Pulse 98  Temp(Src) 99 F (37.2 C) (Oral)  Resp 20  Ht 5\' 3"  (1.6 m)  Wt 175 lb (79.379 kg)  BMI 31.00 kg/m2  SpO2 97%  Physical Exam    Nursing note and vitals reviewed. Constitutional: She is oriented to person, place, and time. She appears well-developed and well-nourished. No distress.  HENT:  Head: Normocephalic and atraumatic.  Eyes: Conjunctivae and EOM are normal.  Neck: Neck supple. No tracheal deviation present.  Cardiovascular: Normal rate and regular rhythm.   Pulmonary/Chest: Effort normal and breath sounds normal. No respiratory distress. She has no wheezes. She has no rales.  Abdominal: Soft. Bowel sounds are normal. She exhibits no distension. There is tenderness (diffuse).  Genitourinary: Guaiac negative stool.       Chaperone present for rectal exam. Normal tone. No stool present in rectal vault.   Musculoskeletal: Normal range of motion. She exhibits no edema.  Neurological: She is alert and oriented to person, place, and time. No sensory deficit.  Skin: Skin is warm and dry.  Psychiatric: She has a normal mood and affect. Her behavior is normal.    ED Course  Procedures (including critical care time)  DIAGNOSTIC STUDIES: Oxygen Saturation is 96% on room air, normal by my interpretation.    COORDINATION OF CARE: 10:07AM- Rectal exam performed by female chaperone present.  3:39 PM  I spoke with Dr. Leticia Penna.  He will admit the pt for further eval and tx.   Labs Reviewed  CBC - Abnormal; Notable for the following:    WBC 13.8 (*)    All other components within normal limits  DIFFERENTIAL - Abnormal; Notable for the following:    Neutrophils Relative 83 (*)    Neutro Abs 11.5 (*)    Lymphocytes Relative 10 (*)    All other components within normal limits  BASIC METABOLIC PANEL - Abnormal; Notable for the following:    CO2 33 (*)    Glucose, Bld 109 (*)    Creatinine, Ser 1.37 (*)    GFR calc non Af Amer 38 (*)    GFR calc Af Amer 43 (*)    All other components within normal limits  URINALYSIS, ROUTINE W REFLEX MICROSCOPIC - Abnormal; Notable for the following:    Bilirubin Urine SMALL (*)     Ketones, ur 15 (*)    Protein, ur 30 (*)    All other components within  normal limits  OCCULT BLOOD, POC DEVICE  URINE MICROSCOPIC-ADD ON  POCT OCCULT BLOOD STOOL, DEVICE   No results found.   No diagnosis found.    MDM  Partial small bowel obstruction      I personally performed the services described in this documentation, which was scribed in my presence. The recorded information has been reviewed and considered.    Christina Stairs, MD 12/29/10 1539  Christina Stairs, MD 12/29/10 [phone]

## 2010-12-29 NOTE — ED Notes (Signed)
Received report from Joanna Keith, RN 

## 2010-12-29 NOTE — ED Notes (Signed)
Pt is from home. N/V/D since yesterday. Blood in stools. Diarrhea became worse this morning. Pt had Zofran 4mg  IV en route by EMS.

## 2010-12-29 NOTE — ED Notes (Signed)
Pt is aware that urine needs to be collect. Will notify staff when she feels the urge to urinate.

## 2010-12-29 NOTE — Consult Note (Signed)
ATTENDING MD:  Dr Marliss Coots.  CONSULT MD:   Houston Siren, MD.    Chief Complaint:   Abdominal pain, n/v and diarrhea.  HPI: Christina REDER is an 70 y.o. female with multiple medical problems presents to the emergency room with three-day history of severe abdominal pain nausea vomiting and diarrhea.  Her significant medical history included Grave's disease,  Hyperlipidemia,  polymyalgia rheumatica, history of hysterectomy. She denies any fever, chills,  black stool or bloody.  Workup in the emergency room with an abdominal CAT scan shows possible small bowel obstruction. She also has leukocytosis with a white count of 13K.  She has slight elevation of her creatinine of 1.37. She was admitted to the surgical service and  hospitalist was asked to consult to manage her various medical problems. Past Medical History  Diagnosis Date  . Grave's disease   . Anxiety   . GERD (gastroesophageal reflux disease)   . Hyperlipidemia   . Herpes   . Polymyalgia rheumatica   . Fibromyalgia   . Palpitation     Past Surgical History  Procedure Date  . Appendectomy   . Abdominal hysterectomy   . Hernia repair   . Carpal tunnel release   . Total abdominal hysterectomy w/ bilateral salpingoophorectomy     Medications:  HOME MEDS: Prior to Admission medications   Medication Sig Start Date End Date Taking? Authorizing Provider  acyclovir (ZOVIRAX) 200 MG capsule Take 400 mg by mouth 2 (two) times daily.     Yes Historical Provider, MD  aspirin EC 81 MG tablet Take 81 mg by mouth daily.     Yes Historical Provider, MD  Calcium Carbonate-Vitamin D (CALCIUM 600+D HIGH POTENCY) 600-400 MG-UNIT per tablet Take 1 tablet by mouth daily.     Yes Historical Provider, MD  clonazePAM (KLONOPIN) 0.5 MG tablet Take 0.25 mg by mouth 3 (three) times daily as needed. Take only for significant anxiety    Yes Historical Provider, MD  docusate sodium (COLACE) 100 MG capsule Take 100 mg by mouth 2 (two) times daily.     Yes  Historical Provider, MD  ferrous gluconate (FERGON) 324 MG tablet Take 324 mg by mouth at bedtime.     Yes Historical Provider, MD  fluconazole (DIFLUCAN) 150 MG tablet Take 150 mg by mouth daily.     Yes Historical Provider, MD  gabapentin (NEURONTIN) 100 MG capsule Take 200 mg by mouth at bedtime.     Yes Historical Provider, MD  HYDROcodone-acetaminophen (NORCO) 10-325 MG per tablet Take 1-2 tablets by mouth 2 (two) times daily as needed. pain    Yes Historical Provider, MD  hydroxychloroquine (PLAQUENIL) 200 MG tablet Take 400 mg by mouth daily.     Yes Historical Provider, MD  hydroxypropyl methylcellulose (ISOPTO TEARS) 2.5 % ophthalmic solution Place 1 drop into both eyes daily as needed. Dry eyes    Yes Historical Provider, MD  levothyroxine (SYNTHROID, LEVOTHROID) 112 MCG tablet Take 112 mcg by mouth daily.     Yes Historical Provider, MD  metoprolol tartrate (LOPRESSOR) 25 MG tablet Take 25 mg by mouth 2 (two) times daily.     Yes Historical Provider, MD  Multiple Vitamins-Minerals (MULTIVITAMINS THER. W/MINERALS) TABS Take 1 tablet by mouth daily.     Yes Historical Provider, MD  nortriptyline (PAMELOR) 25 MG capsule Take 25 mg by mouth at bedtime.     Yes Historical Provider, MD  omeprazole (PRILOSEC) 20 MG capsule Take 40 mg by mouth 2 (two) times  daily.     Yes Historical Provider, MD  oxybutynin (DITROPAN) 5 MG tablet Take 5 mg by mouth daily. This is the long acting med    Yes Historical Provider, MD  predniSONE (DELTASONE) 1 MG tablet Take 3 mg by mouth daily.     Yes Historical Provider, MD  simvastatin (ZOCOR) 80 MG tablet Take 40 mg by mouth at bedtime.     Yes Historical Provider, MD  acetaminophen (TYLENOL) 325 MG tablet Take 650 mg by mouth every 6 (six) hours as needed. pain     Historical Provider, MD    PRIOR TO AMDISSION MEDS Medications Prior to Admission  Medication Dose Route Frequency Provider Last Rate Last Dose  . dextrose 5 % and 0.9 % NaCl with KCl 20 mEq/L  infusion   Intravenous Continuous Houston Siren      . enoxaparin (LOVENOX) injection 40 mg  40 mg Subcutaneous Q24H Brent C Ziegler   40 mg at 12/29/10 1800  . hyoscyamine (LEVSIN SL) SL tablet 0.25 mg  0.25 mg Sublingual Once Nicholes Stairs, MD   0.25 mg at 12/29/10 1015  . hyoscyamine (LEVSIN, ANASPAZ) 0.125 MG tablet           . levothyroxine (SYNTHROID, LEVOTHROID) tablet 112 mcg  112 mcg Oral Daily Fabio Bering      . metoprolol (LOPRESSOR) tablet 25 mg  25 mg Oral BID Fabio Bering      . morphine 2 MG/ML injection 1-4 mg  1-4 mg Intravenous Q4H PRN Fabio Bering   2 mg at 12/29/10 1955  . morphine 4 MG/ML injection 4 mg  4 mg Intravenous Once Nicholes Stairs, MD   4 mg at 12/29/10 1148  . morphine 4 MG/ML injection 4 mg  4 mg Intravenous Once Nicholes Stairs, MD   4 mg at 12/29/10 1549  . ondansetron (ZOFRAN) injection 4 mg  4 mg Intravenous Once Fabio Bering   4 mg at 12/29/10 1209  . ondansetron (ZOFRAN) injection 4 mg  4 mg Intravenous Q6H PRN Fabio Bering      . pantoprazole (PROTONIX) injection 40 mg  40 mg Intravenous Q24H Fabio Bering      . DISCONTD: 0.9 %  sodium chloride infusion   Intravenous Continuous Nicholes Stairs, MD 125 mL/hr at 12/29/10 1015 1,000 mL at 12/29/10 1015  . DISCONTD: lactated ringers infusion   Intravenous Continuous Fabio Bering 100 mL/hr at 12/29/10 1815     No current outpatient prescriptions on file as of 12/29/2010.    Allergies:  Allergies  Allergen Reactions  . Iohexol Shortness Of Breath    Pt stopped breathing at [facility] hospital. Pt refuses IV dye  . Codeine Itching  . Morphine And Related Itching  . Nitrofuran Derivatives     nitrofurantoin  . Tylox Itching    Social History:   reports that she has never smoked. She does not have any smokeless tobacco history on file. She reports that she does not drink alcohol or use illicit drugs.  Family History: No family history on file.  Rewiew of Systems:  The  patient denies anorexia, fever, weight loss,, vision loss, decreased hearing, hoarseness, chest pain, syncope, dyspnea on exertion, peripheral edema, balance deficits, hemoptysis, melena, hematochezia,  hematuria, incontinence, genital sores, muscle weakness, suspicious skin lesions, transient blindness, difficulty walking, depression, unusual weight change, abnormal bleeding, enlarged lymph nodes, angioedema, and breast masses.   Physical Exam: Filed Vitals:  12/29/10 0927 12/29/10 1210 12/29/10 1525 12/29/10 1742  BP: 125/65 147/104 138/65 144/79  Pulse: 95 98 97 90  Temp: 98.8 F (37.1 C) 99 F (37.2 C) 98.6 F (37 C) 98.9 F (37.2 C)  TempSrc: Oral Oral Oral Oral  Resp: 21 20 19 20   Height:    5\' 3"  (1.6 m)  Weight:    81.2 kg (179 lb 0.2 oz)  SpO2: 96% 97% 95% 93%   Blood pressure 144/79, pulse 90, temperature 98.9 F (37.2 C), temperature source Oral, resp. rate 20, height 5\' 3"  (1.6 m), weight 81.2 kg (179 lb 0.2 oz), SpO2 93.00%.  GEN: Pleasant  person lying in her bed in no acute distress; cooperative with exam.  NGT inplaced. PSYCH: He is alert and oriented x4; does not appear anxious does not appear depressed; affect is normal HEENT: Mucous membranes pink and anicteric; PERRLA; EOM intact; no cervical lymphadenopathy nor thyromegaly or carotid bruit; no JVD; Breasts:: Not examined CHEST WALL: No tenderness CHEST: Normal respiration, clear to auscultation bilaterally HEART: Regular rate and rhythm; no murmurs rubs or gallops BACK: No kyphosis or scoliosis; no CVA tenderness ABDOMEN: Obese, mild and diffusely tender with no reboundr; no masses, no organomegaly, normal abdominal bowel sounds; no pannus; no intertriginous candida.  She has surgical scar well healed. Rectal Exam: Not done EXTREMITIES: No bone or joint deformity; age-appropriate arthropathy of the hands and knees; no edema; no ulcerations. Genitalia: not examined PULSES: 2+ and symmetric SKIN: Normal hydration  no rash or ulceration CNS: Cranial nerves 2-12 grossly intact no focal neurologic deficit   Labs & Imaging Results for orders placed during the hospital encounter of 12/29/10 (from the past 48 hour(s))  CBC     Status: Abnormal   Collection Time   12/29/10  9:50 AM      Component Value Range Comment   WBC 13.8 (*) 4.0 - 10.5 (K/uL)    RBC 4.03  3.87 - 5.11 (MIL/uL)    Hemoglobin 12.7  12.0 - 15.0 (g/dL)    HCT 29.5  62.1 - 30.8 (%)    MCV 98.5  78.0 - 100.0 (fL)    MCH 31.5  26.0 - 34.0 (pg)    MCHC 32.0  30.0 - 36.0 (g/dL)    RDW 65.7  84.6 - 96.2 (%)    Platelets 285  150 - 400 (K/uL)   DIFFERENTIAL     Status: Abnormal   Collection Time   12/29/10  9:50 AM      Component Value Range Comment   Neutrophils Relative 83 (*) 43 - 77 (%)    Neutro Abs 11.5 (*) 1.7 - 7.7 (K/uL)    Lymphocytes Relative 10 (*) 12 - 46 (%)    Lymphs Abs 1.3  0.7 - 4.0 (K/uL)    Monocytes Relative 7  3 - 12 (%)    Monocytes Absolute 0.9  0.1 - 1.0 (K/uL)    Eosinophils Relative 1  0 - 5 (%)    Eosinophils Absolute 0.1  0.0 - 0.7 (K/uL)    Basophils Relative 0  0 - 1 (%)    Basophils Absolute 0.0  0.0 - 0.1 (K/uL)   BASIC METABOLIC PANEL     Status: Abnormal   Collection Time   12/29/10  9:50 AM      Component Value Range Comment   Sodium 145  135 - 145 (mEq/L)    Potassium 3.9  3.5 - 5.1 (mEq/L)    Chloride 104  96 -  112 (mEq/L)    CO2 33 (*) 19 - 32 (mEq/L)    Glucose, Bld 109 (*) 70 - 99 (mg/dL)    BUN 16  6 - 23 (mg/dL)    Creatinine, Ser 1.61 (*) 0.50 - 1.10 (mg/dL)    Calcium 9.5  8.4 - 10.5 (mg/dL)    GFR calc non Af Amer 38 (*) >90 (mL/min)    GFR calc Af Amer 43 (*) >90 (mL/min)   OCCULT BLOOD, POC DEVICE     Status: Normal   Collection Time   12/29/10 10:09 AM      Component Value Range Comment   Fecal Occult Bld NEGATIVE     URINALYSIS, ROUTINE W REFLEX MICROSCOPIC     Status: Abnormal   Collection Time   12/29/10 12:10 PM      Component Value Range Comment   Color, Urine  YELLOW  YELLOW     Appearance CLEAR  CLEAR     Specific Gravity, Urine 1.030  1.005 - 1.030     pH 6.0  5.0 - 8.0     Glucose, UA NEGATIVE  NEGATIVE (mg/dL)    Hgb urine dipstick NEGATIVE  NEGATIVE     Bilirubin Urine SMALL (*) NEGATIVE     Ketones, ur 15 (*) NEGATIVE (mg/dL)    Protein, ur 30 (*) NEGATIVE (mg/dL)    Urobilinogen, UA 0.2  0.0 - 1.0 (mg/dL)    Nitrite NEGATIVE  NEGATIVE     Leukocytes, UA NEGATIVE  NEGATIVE    URINE MICROSCOPIC-ADD ON     Status: Normal   Collection Time   12/29/10 12:10 PM      Component Value Range Comment   WBC, UA 0-2  <3 (WBC/hpf)    RBC / HPF 0-2  <3 (RBC/hpf)    Ct Abdomen Pelvis Wo Contrast  12/29/2010  *RADIOLOGY REPORT*  Clinical Data: Abdominal pain, diffuse tenderness to palpation, status post cholecystectomy and appendectomy.  CT ABDOMEN AND PELVIS WITHOUT CONTRAST  Technique:  Multidetector CT imaging of the abdomen and pelvis was performed following the standard protocol without intravenous contrast.  Comparison: None.  Findings: Lung bases are clear.  Small hiatal hernia.  Unenhanced liver, spleen, pancreas, and adrenal glands are within normal limits.  Status post cholecystectomy.  No intrahepatic or extrahepatic ductal dilatation.  Kidneys are unremarkable.  No renal calculi or hydronephrosis.  Mildly prominent loops of mid small bowel leading to a left paramidline ventral hernia (series 2/image 50).  Distal small bowel is relatively decompressed, suggesting some degree of obstruction, however contrast does pass distally (series 2/image 49).  No diverticulosis, without associated inflammatory changes.  Atherosclerotic calcifications of the abdominal aorta and branch vessels.  No abdominopelvic ascites.  No suspicious abdominopelvic lymphadenopathy.  Status post hysterectomy.  No adnexal masses.  No ureteral or bladder calculi.  Bladder is within normal limits.  Mild degenerative changes of the visualized thoracolumbar spine.  IMPRESSION:  Suspected partial small bowel obstruction secondary to a left paramidline ventral hernia.  Findings discussed with and acknowledged by Dr. Cheri Guppy in the ED on 10/17/ 2012 at 1500 hours.  Original Report Authenticated By: Charline Bills, M.D.      Assessment Small bowel obtruction:  Currently she is being managed conservatively with NGT, NPO and IVF.  I will switch her over to D5 NS with 20 KCL.  Her medications are being given appropriately with her synthroid and PPI.   She is stable and hopefully will resolve her SBO without the need  for surgical intervention.  We will follow along with you and have assigned her to AP team 1.  Thank you so much for asking [place] to consult on her.       Iveliz Garay 12/29/2010, 8:01 PM

## 2010-12-29 NOTE — H&P (Signed)
Christina Fry is an 70 y.o. female.   Chief Complaint: Nausea, vomiting, and diarrhea HPI: Patient presented to [facility] ED with approximately 1 wk history of diarrhea.  Multiple loose stools per day.  No blood.  No melena.  States has had similar episodes in the past but never this long.  Last 3 days has had episodes of nausea with emesis.  No fevers but subjective chills.  No significant abd pain but has noted some discomfort after the several days of emesis.  Has not been able to keep fluids down.  Last BM was today.  Loose.  No sick contacts, no unusual travel. Has had her dose of Neurontin increased at about the same time as the diarrhea started.    Past Medical History  Diagnosis Date  . Grave's disease   . Anxiety   . GERD (gastroesophageal reflux disease)   . Hyperlipidemia   . Herpes   . Polymyalgia rheumatica   . Fibromyalgia   . Palpitation     Past Surgical History  Procedure Date  . Appendectomy   . Abdominal hysterectomy   . Hernia repair   . Carpal tunnel release   . Total abdominal hysterectomy w/ bilateral salpingoophorectomy     No family history on file. Social History:  reports that she has never smoked. She does not have any smokeless tobacco history on file. She reports that she does not drink alcohol or use illicit drugs.  Allergies:  Allergies  Allergen Reactions  . Iohexol Shortness Of Breath    Pt stopped breathing at [facility] hospital. Pt refuses IV dye  . Codeine Itching  . Morphine And Related Itching  . Nitrofuran Derivatives     nitrofurantoin  . Tylox Itching    Medications Prior to Admission  Medication Dose Route Frequency Provider Last Rate Last Dose  . dextrose 5 % and 0.9 % NaCl with KCl 20 mEq/L infusion   Intravenous Continuous Houston Siren      . enoxaparin (LOVENOX) injection 40 mg  40 mg Subcutaneous Q24H Keairra Bardon C Elanda Garmany   40 mg at 12/29/10 1800  . hyoscyamine (LEVSIN SL) SL tablet 0.25 mg  0.25 mg Sublingual Once Nicholes Stairs, MD   0.25 mg  at 12/29/10 1015  . hyoscyamine (LEVSIN, ANASPAZ) 0.125 MG tablet           . levothyroxine (SYNTHROID, LEVOTHROID) tablet 112 mcg  112 mcg Oral Daily Fabio Bering      . metoprolol (LOPRESSOR) tablet 25 mg  25 mg Oral BID Fabio Bering      . morphine 2 MG/ML injection 1-4 mg  1-4 mg Intravenous Q4H PRN Fabio Bering   2 mg at 12/29/10 1955  . morphine 4 MG/ML injection 4 mg  4 mg Intravenous Once Nicholes Stairs, MD   4 mg at 12/29/10 1148  . morphine 4 MG/ML injection 4 mg  4 mg Intravenous Once Nicholes Stairs, MD   4 mg at 12/29/10 1549  . ondansetron (ZOFRAN) injection 4 mg  4 mg Intravenous Once Fabio Bering   4 mg at 12/29/10 1209  . ondansetron (ZOFRAN) injection 4 mg  4 mg Intravenous Q6H PRN Fabio Bering      . pantoprazole (PROTONIX) injection 40 mg  40 mg Intravenous Q24H Fabio Bering      . DISCONTD: 0.9 %  sodium chloride infusion   Intravenous Continuous Nicholes Stairs, MD 125 mL/hr at 12/29/10 1015 1,000 mL  at 12/29/10 1015  . DISCONTD: lactated ringers infusion   Intravenous Continuous Fabio Bering 100 mL/hr at 12/29/10 1815     No current outpatient prescriptions on file as of 12/29/2010.    Results for orders placed during the hospital encounter of 12/29/10 (from the past 48 hour(s))  CBC     Status: Abnormal   Collection Time   12/29/10  9:50 AM      Component Value Range Comment   WBC 13.8 (*) 4.0 - 10.5 (K/uL)    RBC 4.03  3.87 - 5.11 (MIL/uL)    Hemoglobin 12.7  12.0 - 15.0 (g/dL)    HCT 16.1  09.6 - 04.5 (%)    MCV 98.5  78.0 - 100.0 (fL)    MCH 31.5  26.0 - 34.0 (pg)    MCHC 32.0  30.0 - 36.0 (g/dL)    RDW 40.9  81.1 - 91.4 (%)    Platelets 285  150 - 400 (K/uL)   DIFFERENTIAL     Status: Abnormal   Collection Time   12/29/10  9:50 AM      Component Value Range Comment   Neutrophils Relative 83 (*) 43 - 77 (%)    Neutro Abs 11.5 (*) 1.7 - 7.7 (K/uL)    Lymphocytes Relative 10 (*) 12 - 46 (%)    Lymphs Abs 1.3  0.7 - 4.0  (K/uL)    Monocytes Relative 7  3 - 12 (%)    Monocytes Absolute 0.9  0.1 - 1.0 (K/uL)    Eosinophils Relative 1  0 - 5 (%)    Eosinophils Absolute 0.1  0.0 - 0.7 (K/uL)    Basophils Relative 0  0 - 1 (%)    Basophils Absolute 0.0  0.0 - 0.1 (K/uL)   BASIC METABOLIC PANEL     Status: Abnormal   Collection Time   12/29/10  9:50 AM      Component Value Range Comment   Sodium 145  135 - 145 (mEq/L)    Potassium 3.9  3.5 - 5.1 (mEq/L)    Chloride 104  96 - 112 (mEq/L)    CO2 33 (*) 19 - 32 (mEq/L)    Glucose, Bld 109 (*) 70 - 99 (mg/dL)    BUN 16  6 - 23 (mg/dL)    Creatinine, Ser 7.82 (*) 0.50 - 1.10 (mg/dL)    Calcium 9.5  8.4 - 10.5 (mg/dL)    GFR calc non Af Amer 38 (*) >90 (mL/min)    GFR calc Af Amer 43 (*) >90 (mL/min)   OCCULT BLOOD, POC DEVICE     Status: Normal   Collection Time   12/29/10 10:09 AM      Component Value Range Comment   Fecal Occult Bld NEGATIVE     URINALYSIS, ROUTINE W REFLEX MICROSCOPIC     Status: Abnormal   Collection Time   12/29/10 12:10 PM      Component Value Range Comment   Color, Urine YELLOW  YELLOW     Appearance CLEAR  CLEAR     Specific Gravity, Urine 1.030  1.005 - 1.030     pH 6.0  5.0 - 8.0     Glucose, UA NEGATIVE  NEGATIVE (mg/dL)    Hgb urine dipstick NEGATIVE  NEGATIVE     Bilirubin Urine SMALL (*) NEGATIVE     Ketones, ur 15 (*) NEGATIVE (mg/dL)    Protein, ur 30 (*) NEGATIVE (mg/dL)    Urobilinogen, UA 0.2  0.0 - 1.0 (mg/dL)    Nitrite NEGATIVE  NEGATIVE     Leukocytes, UA NEGATIVE  NEGATIVE    URINE MICROSCOPIC-ADD ON     Status: Normal   Collection Time   12/29/10 12:10 PM      Component Value Range Comment   WBC, UA 0-2  <3 (WBC/hpf)    RBC / HPF 0-2  <3 (RBC/hpf)    Ct Abdomen Pelvis Wo Contrast  12/29/2010  *RADIOLOGY REPORT*  Clinical Data: Abdominal pain, diffuse tenderness to palpation, status post cholecystectomy and appendectomy.  CT ABDOMEN AND PELVIS WITHOUT CONTRAST  Technique:  Multidetector CT imaging of  the abdomen and pelvis was performed following the standard protocol without intravenous contrast.  Comparison: None.  Findings: Lung bases are clear.  Small hiatal hernia.  Unenhanced liver, spleen, pancreas, and adrenal glands are within normal limits.  Status post cholecystectomy.  No intrahepatic or extrahepatic ductal dilatation.  Kidneys are unremarkable.  No renal calculi or hydronephrosis.  Mildly prominent loops of mid small bowel leading to a left paramidline ventral hernia (series 2/image 50).  Distal small bowel is relatively decompressed, suggesting some degree of obstruction, however contrast does pass distally (series 2/image 49).  No diverticulosis, without associated inflammatory changes.  Atherosclerotic calcifications of the abdominal aorta and branch vessels.  No abdominopelvic ascites.  No suspicious abdominopelvic lymphadenopathy.  Status post hysterectomy.  No adnexal masses.  No ureteral or bladder calculi.  Bladder is within normal limits.  Mild degenerative changes of the visualized thoracolumbar spine.  IMPRESSION: Suspected partial small bowel obstruction secondary to a left paramidline ventral hernia.  Findings discussed with and acknowledged by Dr. Cheri Guppy in the ED on 10/17/ 2012 at 1500 hours.  Original Report Authenticated By: Charline Bills, M.D.    Review of Systems  Constitutional: Positive for chills.  HENT: Negative.   Eyes: Negative.   Respiratory: Negative.   Cardiovascular: Negative.   Gastrointestinal: Positive for nausea, vomiting, diarrhea (1 week) and constipation (occasional). Negative for blood in stool and melena.  Genitourinary: Negative.   Musculoskeletal: Negative.   Skin: Negative.   Neurological: Positive for weakness.  Endo/Heme/Allergies: Negative.   Psychiatric/Behavioral: Negative.     Blood pressure 144/79, pulse 90, temperature 98.9 F (37.2 C), temperature source Oral, resp. rate 20, height 5\' 3"  (1.6 m), weight 81.2 kg (179  lb 0.2 oz), SpO2 93.00%. Physical Exam  Constitutional: She is oriented to person, place, and time. She appears well-nourished.       obese  HENT:  Head: Normocephalic and atraumatic.  Eyes: Conjunctivae are normal. Pupils are equal, round, and reactive to light. No scleral icterus.       Proptosis   Neck: Normal range of motion. Neck supple. No tracheal deviation present. No thyromegaly present.  Cardiovascular: Normal rate, regular rhythm and normal heart sounds.   Respiratory: Effort normal and breath sounds normal. No respiratory distress. She has no wheezes. She has no rales. She exhibits no tenderness.  GI: Soft. Bowel sounds are normal. She exhibits no distension and no mass. There is no tenderness. There is no rebound and no guarding.       Ventral hernia.  Reducible.  Musculoskeletal: She exhibits edema and tenderness.  Lymphadenopathy:    She has no cervical adenopathy.  Neurological: She is alert and oriented to person, place, and time.  Skin: Skin is warm and dry.     Assessment/Plan Nausea, vomiting, diarrhea, dehydration.  Will admit.  Continue fluid resusitation. Bowel rest.  As NG has already been placed by ED MD, will keep in place.  Low suspicion of the hernia being the issue.  Surgical indications were discussed with the patient and family.  Additionally, will ask [facility] to assist with patient's medical management.    Vasilia Dise C 12/29/2010, 8:04 PM

## 2010-12-30 LAB — CBC
HCT: 37.5 % (ref 36.0–46.0)
Hemoglobin: 11.9 g/dL — ABNORMAL LOW (ref 12.0–15.0)
MCH: 31.6 pg (ref 26.0–34.0)
MCHC: 31.7 g/dL (ref 30.0–36.0)

## 2010-12-30 LAB — COMPREHENSIVE METABOLIC PANEL
ALT: 9 U/L (ref 0–35)
AST: 16 U/L (ref 0–37)
CO2: 27 mEq/L (ref 19–32)
Chloride: 107 mEq/L (ref 96–112)
GFR calc non Af Amer: 44 mL/min — ABNORMAL LOW (ref >90)
Sodium: 143 mEq/L (ref 135–145)
Total Bilirubin: 0.4 mg/dL (ref 0.3–1.2)

## 2010-12-30 MED ORDER — SODIUM CHLORIDE 0.9 % IJ SOLN
INTRAMUSCULAR | Status: AC
  Administered 2010-12-30: 10 mL
  Filled 2010-12-30: qty 10

## 2010-12-30 MED ORDER — HYDROMORPHONE HCL 1 MG/ML IJ SOLN
INTRAMUSCULAR | Status: AC
  Administered 2010-12-30: 2 mg
  Filled 2010-12-30: qty 2

## 2010-12-30 MED ORDER — PHENOL 1.4 % MT LIQD
1.0000 | OROMUCOSAL | Status: DC | PRN
  Administered 2010-12-30: 1 via OROMUCOSAL
  Filled 2010-12-30: qty 177

## 2010-12-30 MED ORDER — HYDROMORPHONE HCL 1 MG/ML IJ SOLN
1.0000 mg | INTRAMUSCULAR | Status: DC | PRN
  Administered 2010-12-30 (×2): 1 mg via INTRAVENOUS
  Administered 2010-12-30: 2 mg via INTRAVENOUS
  Filled 2010-12-30: qty 2
  Filled 2010-12-30 (×2): qty 1

## 2010-12-30 MED ORDER — LORAZEPAM 2 MG/ML IJ SOLN
0.5000 mg | Freq: Every evening | INTRAMUSCULAR | Status: DC | PRN
  Administered 2010-12-30: 0.5 mg via INTRAVENOUS
  Filled 2010-12-30: qty 1

## 2010-12-30 NOTE — Progress Notes (Signed)
UR Chart Review Completed  

## 2010-12-30 NOTE — Progress Notes (Signed)
Subjective: Complains of insomnia. Complains of sore throat. No flatus. No pain. Thirsty.  Objective: Vital signs in last 24 hours: Filed Vitals:   12/29/10 2121 12/30/10 0537 12/30/10 1003 12/30/10 1451  BP: 149/81 133/78 119/74 145/78  Pulse: 90 81 89 90  Temp: 98.4 F (36.9 C) 98.4 F (36.9 C)  98.6 F (37 C)  TempSrc:      Resp: 16 20  19   Height:      Weight:      SpO2: 92% 92%  91%   Weight change:   Intake/Output Summary (Last 24 hours) at 12/30/10 1732 Last data filed at 12/30/10 1500  Gross per 24 hour  Intake    200 ml  Output    800 ml  Net   -600 ml   General: Talkative and comfortable. HEENT: Exophthalmus. No thrush. Moist mucous membranes. Lungs clear to auscultation bilaterally without wheeze rhonchi or rales Cardiovascular regular rate rhythm without murmurs gallops rubs Abdomen: Obese. Hypoactive bowel sounds. Soft, nontender. Extremities: No clubbing cyanosis or edema  Lab Results: Basic Metabolic Panel:  Lab 12/30/10 4098 12/29/10 0950  NA 143 145  K 3.6 3.9  CL 107 104  CO2 27 33*  GLUCOSE 124* 109*  BUN 14 16  CREATININE 1.20* 1.37*  CALCIUM 8.5 9.5  MG 1.6 --  PHOS 2.3 --   Liver Function Tests:  Lab 12/30/10 0524  AST 16  ALT 9  ALKPHOS 78  BILITOT 0.4  PROT 5.8*  ALBUMIN 3.0*   No results found for this basename: LIPASE:2,AMYLASE:2 in the last 168 hours No results found for this basename: AMMONIA:2 in the last 168 hours CBC:  Lab 12/30/10 0524 12/29/10 0950  WBC 9.1 13.8*  NEUTROABS -- 11.5*  HGB 11.9* 12.7  HCT 37.5 39.7  MCV 99.7 98.5  PLT 257 285  Thyroid Function Tests:  Lab 12/30/10 0524  TSH 0.611  T4TOTAL --  FREET4 --  T3FREE --  THYROIDAB --  Scheduled Meds:   . enoxaparin  40 mg Subcutaneous Q24H  . HYDROmorphone      . hyoscyamine      . levothyroxine  112 mcg Oral Daily  . metoprolol tartrate  25 mg Oral BID  . pantoprazole (PROTONIX) IV  40 mg Intravenous Q24H  . sodium chloride        Continuous Infusions:   . dextrose 5 % and 0.9 % NaCl with KCl 20 mEq/L 950 mL (12/30/10 0846)  . DISCONTD: sodium chloride 1,000 mL (12/29/10 1015)  . DISCONTD: lactated ringers 100 mL/hr at 12/29/10 1815   PRN Meds:.HYDROmorphone (DILAUDID) injection, LORazepam, ondansetron, phenol, DISCONTD: morphine  Assessment/Plan: Insomnia: IV Ativan nightly as needed Graves' disease: TSH is normal gastroesophageal reflux disease Anxiety Polymyalgia rheumatica Obesity   LOS: 1 day   Bradly Sangiovanni L 12/30/2010, 5:32 PM

## 2010-12-30 NOTE — Progress Notes (Signed)
Subjective: Nausea and vomiting is controlled today. No complaint of increased abdominal pain. She has not had any flatus or bowel function today. She does complain of a headache which is somewhat improved currently.  Objective: Vital signs in last 24 hours: Temp:  [98.4 F (36.9 C)-98.9 F (37.2 C)] 98.6 F (37 C) (10/18 1451) Pulse Rate:  [81-90] 90  (10/18 1451) Resp:  [16-20] 19  (10/18 1451) BP: (119-149)/(74-81) 145/78 mmHg (10/18 1451) SpO2:  [91 %-93 %] 91 % (10/18 1451) Weight:  [81.2 kg (179 lb 0.2 oz)] 179 lb 0.2 oz (81.2 kg) (10/17 1742) Last BM Date: 12/29/10  Intake/Output from previous day:   Intake/Output this shift: Total I/O In: 200 [P.O.:200] Out: 800 [Emesis/NG output:800]  General appearance: alert and no distress Resp: clear to auscultation bilaterally Cardio: regular rate and rhythm GI: Diminished bowel sounds abdomen is soft obese, mild tenderness to deep palpation. No diffuse peritoneal signs. She does have a reducible ventral hernia. Again this is easily reducible.  Lab Results:  @LABLAST2 (wbc:2,hgb:2,hct:2,plt:2) BMET  Basename 12/30/10 0524 12/29/10 0950  NA 143 145  K 3.6 3.9  CL 107 104  CO2 27 33*  GLUCOSE 124* 109*  BUN 14 16  CREATININE 1.20* 1.37*  CALCIUM 8.5 9.5   PT/INR No results found for this basename: LABPROT:2,INR:2 in the last 72 hours ABG No results found for this basename: PHART:2,PCO2:2,PO2:2,HCO3:2 in the last 72 hours  Studies/Results: Ct Abdomen Pelvis Wo Contrast  12/29/2010  *RADIOLOGY REPORT*  Clinical Data: Abdominal pain, diffuse tenderness to palpation, status post cholecystectomy and appendectomy.  CT ABDOMEN AND PELVIS WITHOUT CONTRAST  Technique:  Multidetector CT imaging of the abdomen and pelvis was performed following the standard protocol without intravenous contrast.  Comparison: None.  Findings: Lung bases are clear.  Small hiatal hernia.  Unenhanced liver, spleen, pancreas, and adrenal glands are  within normal limits.  Status post cholecystectomy.  No intrahepatic or extrahepatic ductal dilatation.  Kidneys are unremarkable.  No renal calculi or hydronephrosis.  Mildly prominent loops of mid small bowel leading to a left paramidline ventral hernia (series 2/image 50).  Distal small bowel is relatively decompressed, suggesting some degree of obstruction, however contrast does pass distally (series 2/image 49).  No diverticulosis, without associated inflammatory changes.  Atherosclerotic calcifications of the abdominal aorta and branch vessels.  No abdominopelvic ascites.  No suspicious abdominopelvic lymphadenopathy.  Status post hysterectomy.  No adnexal masses.  No ureteral or bladder calculi.  Bladder is within normal limits.  Mild degenerative changes of the visualized thoracolumbar spine.  IMPRESSION: Suspected partial small bowel obstruction secondary to a left paramidline ventral hernia.  Findings discussed with and acknowledged by Dr. Cheri Guppy in the ED on 10/17/ 2012 at 1500 hours.  Original Report Authenticated By: Charline Bills, M.D.    Anti-infectives: Anti-infectives    None      Assessment/Plan: s/p  Partial small bowel structure versus ileus. At this point I continued have a low suspicion that her hernia is contributing to her symptomatology. Regardless at this time we'll continue conservative management with continued bowel rest continued IV fluid hydration and nasogastric decompression of her GI tract. Indications for proceeding to the operating room were discussed at length again with the patient. Should patient not demonstrate any urgent indications for proceeding to the operating room however failed to resolve with conservative management I did discuss with her possible proceeding to the operating room at that time. However should that occur we discussed an additional contrast study  to confirm a mechanical issue causing her symptomatology versus an adynamic process  or medically induced ileus. Patient understands and will continue the current regimen and management.  LOS: 1 day    Kyreese Chio C 12/30/2010

## 2010-12-31 ENCOUNTER — Inpatient Hospital Stay (HOSPITAL_COMMUNITY): Payer: Non-veteran care

## 2010-12-31 LAB — COMPREHENSIVE METABOLIC PANEL
Alkaline Phosphatase: 81 U/L (ref 39–117)
BUN: 11 mg/dL (ref 6–23)
Calcium: 8.9 mg/dL (ref 8.4–10.5)
GFR calc Af Amer: 47 mL/min — ABNORMAL LOW (ref >90)
GFR calc non Af Amer: 40 mL/min — ABNORMAL LOW (ref >90)
Glucose, Bld: 147 mg/dL — ABNORMAL HIGH (ref 70–99)
Potassium: 3.6 mEq/L (ref 3.5–5.1)
Total Protein: 6 g/dL (ref 6.0–8.3)

## 2010-12-31 LAB — MAGNESIUM: Magnesium: 1.6 mg/dL (ref 1.5–2.5)

## 2010-12-31 LAB — CBC
MCH: 31.6 pg (ref 26.0–34.0)
Platelets: 250 10*3/uL (ref 150–400)
RBC: 3.61 MIL/uL — ABNORMAL LOW (ref 3.87–5.11)
RDW: 13.9 % (ref 11.5–15.5)

## 2010-12-31 IMAGING — CR DG CHEST 1V PORT
1 series · 1 of 1 positions shown · non-contrast
Comparison: [DATE]

CLINICAL DATA: Shortness of breath.

PORTABLE CHEST - 1 VIEW

[view not recorded]
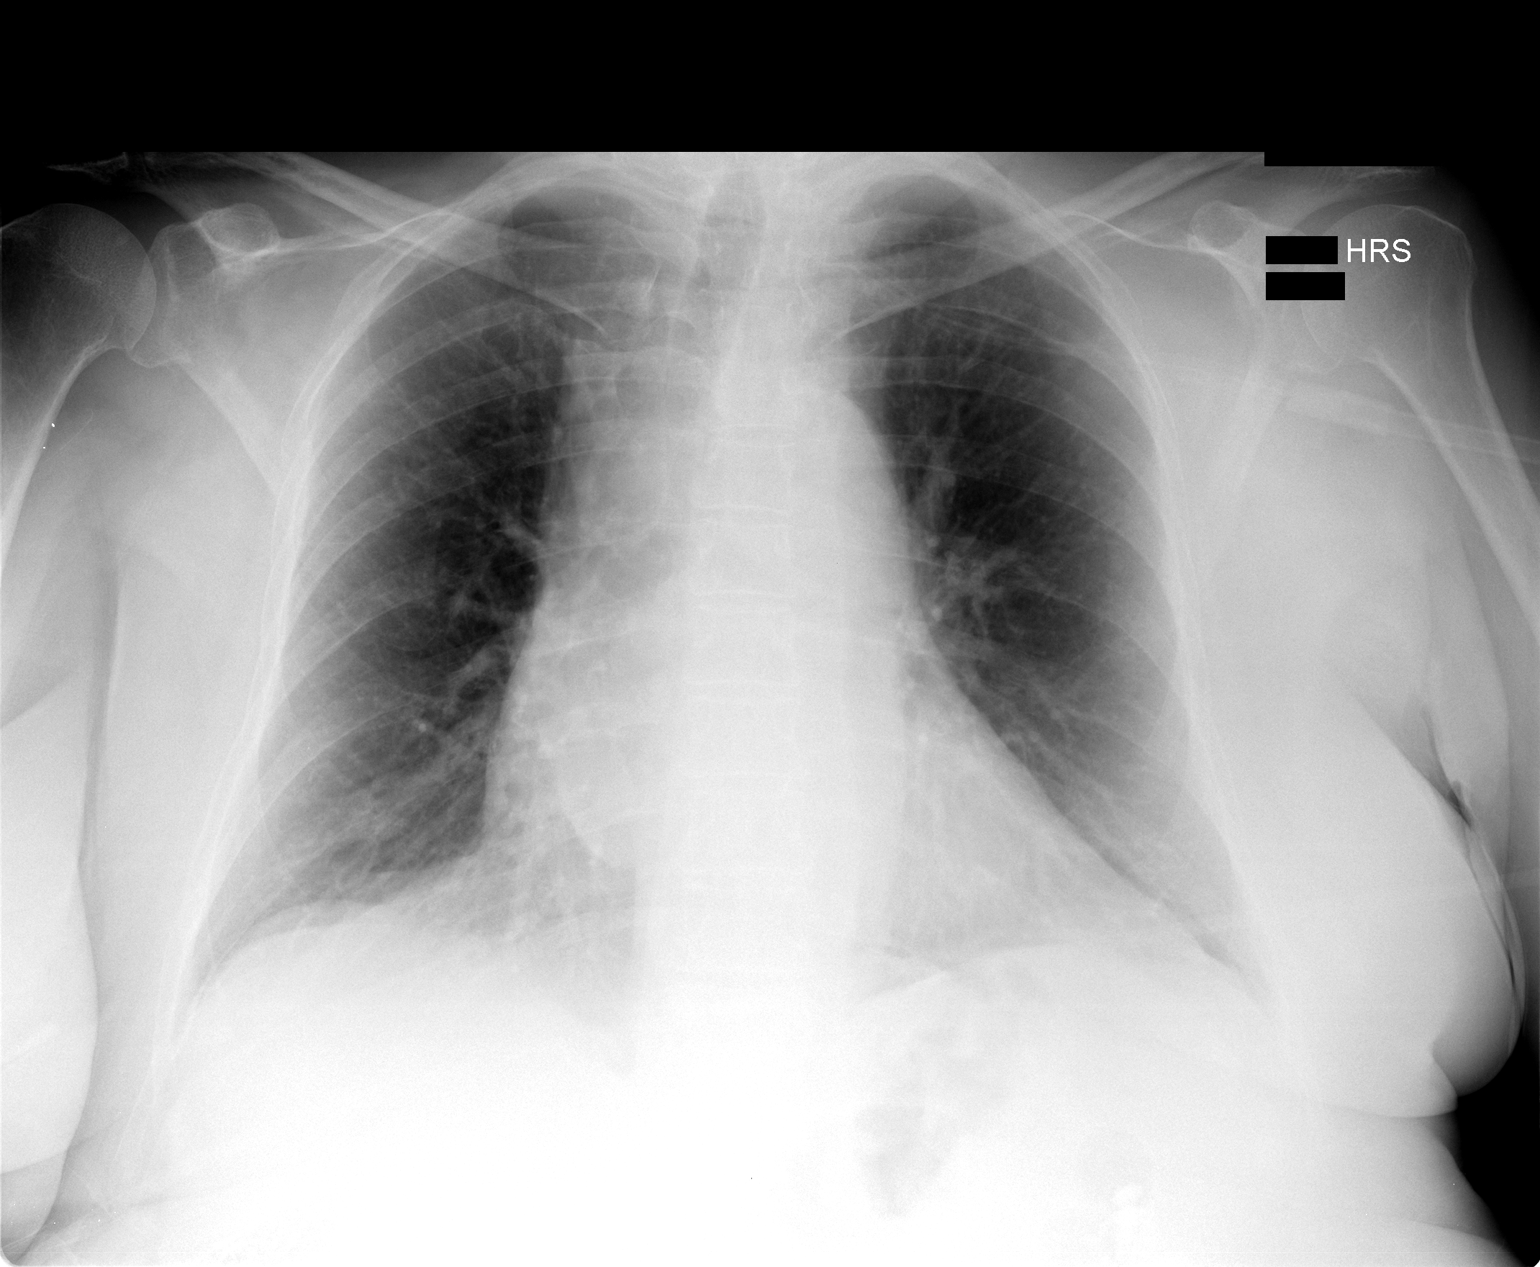

[1 of 1 positions shown; findings below may reference images not displayed]

FINDINGS: Normal heart size and pulmonary vascularity.  No focal
airspace consolidation in the lungs.  Curvilinear shadow inferior
to the right hilum of nonspecific etiology.  Mediastinal mass is
not excluded.  CT recommended for further evaluation.
IMPRESSION: No evidence of active pulmonary disease.  Nonspecific soft tissue
shadow inferior to the right hilum.  Consider CT for further
evaluation.

## 2010-12-31 MED ORDER — DIPHENHYDRAMINE HCL 25 MG PO CAPS
25.0000 mg | ORAL_CAPSULE | Freq: Four times a day (QID) | ORAL | Status: DC | PRN
  Administered 2010-12-31 – 2011-01-01 (×3): 25 mg via ORAL
  Filled 2010-12-31 (×3): qty 1

## 2010-12-31 MED ORDER — SODIUM CHLORIDE 0.9 % IJ SOLN
INTRAMUSCULAR | Status: AC
  Administered 2010-12-31: 10 mL
  Filled 2010-12-31: qty 10

## 2010-12-31 MED ORDER — LORAZEPAM 2 MG/ML IJ SOLN
0.2500 mg | Freq: Every evening | INTRAMUSCULAR | Status: AC | PRN
  Administered 2011-01-01: 0.26 mg via INTRAVENOUS
  Filled 2010-12-31: qty 1

## 2010-12-31 MED ORDER — POTASSIUM CHLORIDE 2 MEQ/ML IV SOLN
INTRAVENOUS | Status: DC
  Administered 2010-12-31 – 2011-01-04 (×6): via INTRAVENOUS
  Filled 2010-12-31 (×15): qty 1000

## 2010-12-31 MED ORDER — HYDROMORPHONE HCL 1 MG/ML IJ SOLN
0.5000 mg | INTRAMUSCULAR | Status: DC | PRN
  Administered 2010-12-31 – 2011-01-01 (×4): 0.5 mg via INTRAVENOUS
  Filled 2010-12-31 (×4): qty 1

## 2010-12-31 MED ORDER — SODIUM CHLORIDE 0.9 % IJ SOLN
INTRAMUSCULAR | Status: AC
  Filled 2010-12-31: qty 3

## 2010-12-31 MED ORDER — SODIUM CHLORIDE 0.9 % IJ SOLN
INTRAMUSCULAR | Status: AC
  Filled 2010-12-31: qty 10

## 2010-12-31 MED ORDER — LORAZEPAM 2 MG/ML IJ SOLN
0.5000 mg | Freq: Once | INTRAMUSCULAR | Status: AC
  Administered 2010-12-31: 0.5 mg via INTRAVENOUS
  Filled 2010-12-31: qty 1

## 2010-12-31 NOTE — Progress Notes (Signed)
Subjective:  The patient is sitting up in the chair without complaints of abdominal pain or nausea. She became confused and pulled out her NG tube. Her daughter is concerned about the patient's confusion early this morning.   Objective: Vital signs in last 24 hours: Filed Vitals:   12/30/10 2225 12/31/10 0500 12/31/10 1120 12/31/10 1545  BP: 131/75 122/76  110/68  Pulse: 102 93  79  Temp: 99.1 F (37.3 C) 98.5 F (36.9 C)  98 F (36.7 C)  TempSrc: Oral Oral  Oral  Resp: 20 24  21   Height:      Weight:      SpO2: 80% 98% 98% 94%   Weight change:   Intake/Output Summary (Last 24 hours) at 12/31/10 1641 Last data filed at 12/30/10 2228  Gross per 24 hour  Intake      0 ml  Output    100 ml  Net   -100 ml   General: NAD. Lungs clear to auscultation bilaterally without wheeze rhonchi or rales Cardiovascular regular rate rhythm without murmurs gallops rubs Abdomen: Obese. Hypoactive bowel sounds. Soft, nontender. Extremities: No clubbing cyanosis or edema. Neuro: A & O x 2. CN 2-12 intact. Speech clear.  Lab Results: Basic Metabolic Panel:  Lab 12/31/10 1610 12/30/10 0524  NA 146* 143  K 3.6 3.6  CL 111 107  CO2 28 27  GLUCOSE 147* 124*  BUN 11 14  CREATININE 1.29* 1.20*  CALCIUM 8.9 8.5  MG 1.6 1.6  PHOS 2.3 2.3   Liver Function Tests:  Lab 12/31/10 0433 12/30/10 0524  AST 14 16  ALT 11 9  ALKPHOS 81 78  BILITOT 0.4 0.4  PROT 6.0 5.8*  ALBUMIN 2.9* 3.0*   No results found for this basename: LIPASE:2,AMYLASE:2 in the last 168 hours No results found for this basename: AMMONIA:2 in the last 168 hours CBC:  Lab 12/31/10 0433 12/30/10 0524 12/29/10 0950  WBC 13.3* 9.1 --  NEUTROABS -- -- 11.5*  HGB 11.4* 11.9* --  HCT 36.8 37.5 --  MCV 101.9* 99.7 --  PLT 250 257 --  Thyroid Function Tests:  Lab 12/30/10 0524  TSH 0.611  T4TOTAL --  FREET4 --  T3FREE --  THYROIDAB --  Scheduled Meds:    . enoxaparin  40 mg Subcutaneous Q24H  . levothyroxine   112 mcg Oral Daily  . LORazepam  0.5 mg Intravenous Once  . metoprolol tartrate  25 mg Oral BID  . pantoprazole (PROTONIX) IV  40 mg Intravenous Q24H  . sodium chloride      . sodium chloride       Continuous Infusions:    . dextrose 5 % and 0.9 % NaCl with KCl 20 mEq/L 100 mL/hr at 12/31/10 1402   PRN Meds:.HYDROmorphone (DILAUDID) injection, LORazepam, ondansetron, phenol, DISCONTD:  HYDROmorphone (DILAUDID) injection, DISCONTD: LORazepam  Assessment/Plan: I Confusion- likely secondary to Dilaudid and Ativan. Both doses have been decreased.  Hypernatremia. Serum sodium has increased to 146. Will change IVF to D5 1/2 NS with added KCL and follow.  ARF. Creatinine slowly improving.  Graves's disease/hypothyroidism. Stable.  Insomnia. Was given prn Ativan.  GERD. On IV Protonix.  PMR. Stable.  Anxiety. Stable.  Partial SBO. Per Dr. Leticia Penna.     LOS: 2 days   Cristin Szatkowski 12/31/2010, 4:41 PM

## 2010-12-31 NOTE — Progress Notes (Signed)
Subjective: Patient's shortness of breath from this morning has improved significantly. She states it felt like she has phlegm in the back of her throat that she could not get rid of. This is improved. She has no fevers or chills. No nausea. She has had flatus but no bowel movement today. No significant abdominal discomfort or pain.  Objective: Vital signs in last 24 hours: Temp:  [97.4 F (36.3 C)-99.1 F (37.3 C)] 98.5 F (36.9 C) (10/19 0500) Pulse Rate:  [90-102] 93  (10/19 0500) Resp:  [19-24] 24  (10/19 0500) BP: (122-148)/(75-78) 122/76 mmHg (10/19 0500) SpO2:  [80 %-98 %] 98 % (10/19 1120) Last BM Date: 12/29/10  Intake/Output from previous day: 10/18 0701 - 10/19 0700 In: 200 [P.O.:200] Out: 900 [Urine:100; Emesis/NG output:800] Intake/Output this shift:    General appearance: alert and no distress GI: Positive bowel sounds, abdomen is soft nondistended obese. Nontender to palpation. She does have a reducible left-para midline ventral hernia with no discomfort.  Lab Results:  @LABLAST2 (wbc:2,hgb:2,hct:2,plt:2) BMET  Basename 12/31/10 0433 12/30/10 0524  NA 146* 143  K 3.6 3.6  CL 111 107  CO2 28 27  GLUCOSE 147* 124*  BUN 11 14  CREATININE 1.29* 1.20*  CALCIUM 8.9 8.5   PT/INR No results found for this basename: LABPROT:2,INR:2 in the last 72 hours ABG No results found for this basename: PHART:2,PCO2:2,PO2:2,HCO3:2 in the last 72 hours  Studies/Results: Ct Abdomen Pelvis Wo Contrast  12/29/2010  *RADIOLOGY REPORT*  Clinical Data: Abdominal pain, diffuse tenderness to palpation, status post cholecystectomy and appendectomy.  CT ABDOMEN AND PELVIS WITHOUT CONTRAST  Technique:  Multidetector CT imaging of the abdomen and pelvis was performed following the standard protocol without intravenous contrast.  Comparison: None.  Findings: Lung bases are clear.  Small hiatal hernia.  Unenhanced liver, spleen, pancreas, and adrenal glands are within normal limits.   Status post cholecystectomy.  No intrahepatic or extrahepatic ductal dilatation.  Kidneys are unremarkable.  No renal calculi or hydronephrosis.  Mildly prominent loops of mid small bowel leading to a left paramidline ventral hernia (series 2/image 50).  Distal small bowel is relatively decompressed, suggesting some degree of obstruction, however contrast does pass distally (series 2/image 49).  No diverticulosis, without associated inflammatory changes.  Atherosclerotic calcifications of the abdominal aorta and branch vessels.  No abdominopelvic ascites.  No suspicious abdominopelvic lymphadenopathy.  Status post hysterectomy.  No adnexal masses.  No ureteral or bladder calculi.  Bladder is within normal limits.  Mild degenerative changes of the visualized thoracolumbar spine.  IMPRESSION: Suspected partial small bowel obstruction secondary to a left paramidline ventral hernia.  Findings discussed with and acknowledged by Dr. Cheri Guppy in the ED on 10/17/ 2012 at 1500 hours.  Original Report Authenticated By: Charline Bills, M.D.   Dg Chest Portable 1 View  12/31/2010  *RADIOLOGY REPORT*  Clinical Data: Shortness of breath.  PORTABLE CHEST - 1 VIEW  Comparison: 03/21/2010  Findings: Normal heart size and pulmonary vascularity.  No focal airspace consolidation in the lungs.  Curvilinear shadow inferior to the right hilum of nonspecific etiology.  Mediastinal mass is not excluded.  CT recommended for further evaluation.  IMPRESSION: No evidence of active pulmonary disease.  Nonspecific soft tissue shadow inferior to the right hilum.  Consider CT for further evaluation.  Original Report Authenticated By: Marlon Pel, M.D.    Anti-infectives: Anti-infectives    None      Assessment/Plan: s/p  Partial SBO versus adynamic process. At this time would  continue current regiment and limit by mouth until GI function increases. I still do not feel this time that the ventral hernia is  contributing to her symptomatology. Has had a low suspicion at this time that she has a stricture or narrowing causing her issue. As discussed with the patient and daughter who is present at the time of the discussion, we'll continue to watch her progression over the weekend. Should she fail to improve we will obtain a contrast study on Monday/Tuesday 2 reevaluate the GI tract. At this point I do not have plans to proceed to the operating room and less a mechanical issue is truly demonstrated or should her exam worsens.  LOS: 2 days    Nalany Steedley C 12/31/2010

## 2011-01-01 LAB — COMPREHENSIVE METABOLIC PANEL
ALT: 12 U/L (ref 0–35)
AST: 16 U/L (ref 0–37)
Albumin: 2.6 g/dL — ABNORMAL LOW (ref 3.5–5.2)
CO2: 28 mEq/L (ref 19–32)
Calcium: 8.7 mg/dL (ref 8.4–10.5)
GFR calc non Af Amer: 44 mL/min — ABNORMAL LOW (ref >90)
Sodium: 142 mEq/L (ref 135–145)
Total Protein: 5.3 g/dL — ABNORMAL LOW (ref 6.0–8.3)

## 2011-01-01 LAB — CBC
HCT: 32.9 % — ABNORMAL LOW (ref 36.0–46.0)
Hemoglobin: 10.4 g/dL — ABNORMAL LOW (ref 12.0–15.0)
MCV: 100.3 fL — ABNORMAL HIGH (ref 78.0–100.0)
RBC: 3.28 MIL/uL — ABNORMAL LOW (ref 3.87–5.11)
WBC: 9.2 10*3/uL (ref 4.0–10.5)

## 2011-01-01 LAB — CLOSTRIDIUM DIFFICILE BY PCR: Toxigenic C. Difficile by PCR: NEGATIVE

## 2011-01-01 LAB — PHOSPHORUS: Phosphorus: 2.1 mg/dL — ABNORMAL LOW (ref 2.3–4.6)

## 2011-01-01 MED ORDER — MAGNESIUM SULFATE 50 % IJ SOLN
1.0000 g | Freq: Once | INTRAVENOUS | Status: AC
  Administered 2011-01-01: 1 g via INTRAVENOUS
  Filled 2011-01-01: qty 2

## 2011-01-01 MED ORDER — SODIUM CHLORIDE 0.9 % IJ SOLN
INTRAMUSCULAR | Status: AC
  Administered 2011-01-01: 10 mL
  Filled 2011-01-01: qty 10

## 2011-01-01 NOTE — Progress Notes (Signed)
  Subjective: No significant change.  Still with some nausea.  No fevers or chills.  Had an episode of diarrhea this am.   Objective: Vital signs in last 24 hours: Temp:  [98 F (36.7 C)-98.9 F (37.2 C)] 98.9 F (37.2 C) (10/20 4098) Pulse Rate:  [79-86] 80  (10/20 0608) Resp:  [16-21] 16  (10/20 0608) BP: (110-131)/(68-75) 127/75 mmHg (10/20 0608) SpO2:  [93 %-95 %] 95 % (10/20 1191) Last BM Date: 01/01/11  Intake/Output from previous day: 10/19 0701 - 10/20 0700 In: 270 [P.O.:260; I.V.:10] Out: 700 [Urine:700] Intake/Output this shift: Total I/O In: 120 [P.O.:120] Out: -   General appearance: alert and no distress Resp: clear to auscultation bilaterally Cardio: regular rate and rhythm GI: +BS, soft, mild left sided tenderness.  No peritoneal signs.  Ventral hernia easily reducible.  Lab Results:  @LABLAST2 (wbc:2,hgb:2,hct:2,plt:2) BMET  Basename 01/01/11 0655 12/31/10 0433  NA 142 146*  K 3.7 3.6  CL 109 111  CO2 28 28  GLUCOSE 106* 147*  BUN 6 11  CREATININE 1.20* 1.29*  CALCIUM 8.7 8.9   PT/INR No results found for this basename: LABPROT:2,INR:2 in the last 72 hours ABG No results found for this basename: PHART:2,PCO2:2,PO2:2,HCO3:2 in the last 72 hours  Studies/Results: Dg Chest Portable 1 View  12/31/2010  *RADIOLOGY REPORT*  Clinical Data: Shortness of breath.  PORTABLE CHEST - 1 VIEW  Comparison: 03/21/2010  Findings: Normal heart size and pulmonary vascularity.  No focal airspace consolidation in the lungs.  Curvilinear shadow inferior to the right hilum of nonspecific etiology.  Mediastinal mass is not excluded.  CT recommended for further evaluation.  IMPRESSION: No evidence of active pulmonary disease.  Nonspecific soft tissue shadow inferior to the right hilum.  Consider CT for further evaluation.  Original Report Authenticated By: Marlon Pel, M.D.    Anti-infectives: Anti-infectives    None      Assessment/Plan: s/p  Ileus vs  pSBO,  if no change on Monday, will check SBFT.    LOS: 3 days    Zea Kostka C 01/01/2011

## 2011-01-01 NOTE — Progress Notes (Signed)
Subjective:  The patient complains of diarrhea this morning. Her daughter says she has had at least 2 loose stool this morning. The patient denies nausea, but has crampy left sided abdominal pain.   Objective: Vital signs in last 24 hours: Filed Vitals:   12/31/10 1120 12/31/10 1545 12/31/10 2200 01/01/11 0608  BP:  110/68 131/75 127/75  Pulse:  79 86 80  Temp:  98 F (36.7 C) 98.6 F (37 C) 98.9 F (37.2 C)  TempSrc:  Oral Oral Oral  Resp:  21 20 16   Height:      Weight:      SpO2: 98% 94% 93% 95%   Weight change:   Intake/Output Summary (Last 24 hours) at 01/01/11 1314 Last data filed at 01/01/11 0900  Gross per 24 hour  Intake    390 ml  Output    700 ml  Net   -310 ml   General: NAD. Lungs clear to auscultation bilaterally without wheeze rhonchi or rales Cardiovascular regular rate rhythm without murmurs gallops rubs Abdomen: Obese. Positive bowel sounds. Soft, mildy tender RLQ and LLQ; no distention; no guarding. Loose brown stool seen in the toilet seat. Extremities: No clubbing cyanosis or edema. Neuro: A & O x 2. CN 2-12 intact. Speech clear.  Lab Results: Basic Metabolic Panel:  Lab 01/01/11 4098 12/31/10 0433  NA 142 146*  K 3.7 3.6  CL 109 111  CO2 28 28  GLUCOSE 106* 147*  BUN 6 11  CREATININE 1.20* 1.29*  CALCIUM 8.7 8.9  MG 1.3* 1.6  PHOS 2.1* 2.3   Liver Function Tests:  Lab 01/01/11 0655 12/31/10 0433  AST 16 14  ALT 12 11  ALKPHOS 84 81  BILITOT 0.5 0.4  PROT 5.3* 6.0  ALBUMIN 2.6* 2.9*   No results found for this basename: LIPASE:2,AMYLASE:2 in the last 168 hours No results found for this basename: AMMONIA:2 in the last 168 hours CBC:  Lab 01/01/11 0655 12/31/10 0433 12/29/10 0950  WBC 9.2 13.3* --  NEUTROABS -- -- 11.5*  HGB 10.4* 11.4* --  HCT 32.9* 36.8 --  MCV 100.3* 101.9* --  PLT 218 250 --  Thyroid Function Tests:  Lab 12/30/10 0524  TSH 0.611  T4TOTAL --  FREET4 --  T3FREE --  THYROIDAB --  Scheduled Meds:      . enoxaparin  40 mg Subcutaneous Q24H  . levothyroxine  112 mcg Oral Daily  . metoprolol tartrate  25 mg Oral BID  . pantoprazole (PROTONIX) IV  40 mg Intravenous Q24H  . sodium chloride      . sodium chloride      . sodium chloride      . sodium chloride      . sodium chloride       Continuous Infusions:    . dextrose 5 % and 0.45% NaCl 1,000 mL with potassium chloride 40 mEq infusion 100 mL/hr at 01/01/11 0436  . DISCONTD: dextrose 5 % and 0.9 % NaCl with KCl 20 mEq/L 100 mL/hr at 12/31/10 1402   PRN Meds:.diphenhydrAMINE, HYDROmorphone (DILAUDID) injection, LORazepam, ondansetron, phenol  Assessment/Plan: I Confusion- likely secondary to Dilaudid and Ativan. Both doses have been decreased. Resolved.  Reported diarrhea in setting of possible partial SBO. C. Diff PCR ordered by Dr. Leticia Penna.  Hypomagnesemia. Will replete IV.  Macrocytosis. Will check Vitamin B12 and folate levels.  Mild anemia, may be dilutional.  Hypernatremia. Resolved, status post 1/2 NS.  ARF. Creatinine slowly improving. The patient may have underlying  CKD.  Graves's disease/hypothyroidism. Stable.  Insomnia. On prn Ativan.  GERD. On IV Protonix.  PMR. Stable.  Anxiety. Stable.       LOS: 3 days   Christina Fry 01/01/2011, 1:14 PM

## 2011-01-02 LAB — COMPREHENSIVE METABOLIC PANEL WITH GFR
ALT: 12 U/L (ref 0–35)
AST: 14 U/L (ref 0–37)
Albumin: 2.6 g/dL — ABNORMAL LOW (ref 3.5–5.2)
Alkaline Phosphatase: 77 U/L (ref 39–117)
BUN: 3 mg/dL — ABNORMAL LOW (ref 6–23)
CO2: 23 meq/L (ref 19–32)
Calcium: 9.1 mg/dL (ref 8.4–10.5)
Chloride: 104 meq/L (ref 96–112)
Creatinine, Ser: 1.25 mg/dL — ABNORMAL HIGH (ref 0.50–1.10)
GFR calc Af Amer: 49 mL/min — ABNORMAL LOW
GFR calc non Af Amer: 42 mL/min — ABNORMAL LOW
Glucose, Bld: 114 mg/dL — ABNORMAL HIGH (ref 70–99)
Potassium: 4 meq/L (ref 3.5–5.1)
Sodium: 136 meq/L (ref 135–145)
Total Bilirubin: 0.4 mg/dL (ref 0.3–1.2)
Total Protein: 5.3 g/dL — ABNORMAL LOW (ref 6.0–8.3)

## 2011-01-02 LAB — CBC
HCT: 32.4 % — ABNORMAL LOW (ref 36.0–46.0)
Hemoglobin: 10.5 g/dL — ABNORMAL LOW (ref 12.0–15.0)
MCH: 31.8 pg (ref 26.0–34.0)
MCHC: 32.4 g/dL (ref 30.0–36.0)
MCV: 98.2 fL (ref 78.0–100.0)
Platelets: 220 10*3/uL (ref 150–400)
RBC: 3.3 MIL/uL — ABNORMAL LOW (ref 3.87–5.11)
RDW: 13.3 % (ref 11.5–15.5)
WBC: 8.3 10*3/uL (ref 4.0–10.5)

## 2011-01-02 MED ORDER — OXYCODONE-ACETAMINOPHEN 5-325 MG PO TABS
1.0000 | ORAL_TABLET | ORAL | Status: DC | PRN
  Administered 2011-01-02 – 2011-01-03 (×3): 1 via ORAL
  Filled 2011-01-02 (×4): qty 1

## 2011-01-02 NOTE — Progress Notes (Signed)
Subjective:  The patient complains of chronic arthritic pain and wants to know if her Percocet can be restarted. She says that the Dilaudid makes her itch and wants it discontinued. She also reports bright red blood per rectum. She denies ongoing diarrhea. Her abdominal pain has subsided.  Objective: Vital signs in last 24 hours: Filed Vitals:   01/01/11 0608 01/01/11 1624 01/01/11 2200 01/02/11 0600  BP: 127/75 114/69 112/62 136/76  Pulse: 80 75 84 77  Temp: 98.9 F (37.2 C) 98.4 F (36.9 C) 99.8 F (37.7 C) 98.1 F (36.7 C)  TempSrc: Oral Oral Oral Oral  Resp: 16 17 20 20   Height:      Weight:      SpO2: 95% 95% 95% 97%   Weight change:   Intake/Output Summary (Last 24 hours) at 01/02/11 1359 Last data filed at 01/02/11 0500  Gross per 24 hour  Intake    420 ml  Output   1900 ml  Net  -1480 ml   General: NAD. Lungs clear to auscultation bilaterally without wheeze rhonchi or rales Cardiovascular regular rate rhythm without murmurs gallops rubs Abdomen: Obese. Positive bowel sounds. Soft, mildy tender RLQ and LLQ; no distention; no guarding. Extremities: No clubbing cyanosis or edema. Neuro: A & O x 2. CN 2-12 intact. Speech clear.  Lab Results: Basic Metabolic Panel:  Lab 01/02/11 1308 01/01/11 0655  NA 136 142  K 4.0 3.7  CL 104 109  CO2 23 28  GLUCOSE 114* 106*  BUN 3* 6  CREATININE 1.25* 1.20*  CALCIUM 9.1 8.7  MG 1.6 1.3*  PHOS 2.3 2.1*   Liver Function Tests:  Lab 01/02/11 0340 01/01/11 0655  AST 14 16  ALT 12 12  ALKPHOS 77 84  BILITOT 0.4 0.5  PROT 5.3* 5.3*  ALBUMIN 2.6* 2.6*   No results found for this basename: LIPASE:2,AMYLASE:2 in the last 168 hours No results found for this basename: AMMONIA:2 in the last 168 hours CBC:  Lab 01/02/11 0340 01/01/11 0655 12/29/10 0950  WBC 8.3 9.2 --  NEUTROABS -- -- 11.5*  HGB 10.5* 10.4* --  HCT 32.4* 32.9* --  MCV 98.2 100.3* --  PLT 220 218 --  Thyroid Function Tests:  Lab 12/30/10 0524  TSH  0.611  T4TOTAL --  FREET4 --  T3FREE --  THYROIDAB --  Scheduled Meds:    . enoxaparin  40 mg Subcutaneous Q24H  . levothyroxine  112 mcg Oral Daily  . magnesium sulfate infusion  1 g Intravenous Once  . metoprolol tartrate  25 mg Oral BID  . pantoprazole (PROTONIX) IV  40 mg Intravenous Q24H  . sodium chloride      . sodium chloride      . sodium chloride       Continuous Infusions:    . dextrose 5 % and 0.45% NaCl 1,000 mL with potassium chloride 40 mEq infusion 100 mL/hr at 01/01/11 1622   PRN Meds:.diphenhydrAMINE, LORazepam, ondansetron, oxyCODONE-acetaminophen, phenol, DISCONTD:  HYDROmorphone (DILAUDID) injection  Assessment/Plan:   Ileus versus small bowel obstruction. Dr. Neita Carp assessment is noted.  Reported hematochezia. We'll need to monitor her hemoglobin.  Reported diarrhea in setting of possible partial SBO. C. Diff PCR is negative.  Chronic pain/degenerative joint disease. Will restart when necessary Percocet.  Hypomagnesemia, status post IV magnesium sulfate.  Macrocytosis/anemia. Both vitamin B 12 and folate levels are within normal limits.  Hypernatremia. Resolved, status post 1/2 NS.  ARF. Suspect underlying chronic kidney disease.  Graves's disease/hypothyroidism. Stable.  TSH is within normal limits.  Insomnia. On prn Ativan.  GERD. On IV Protonix.  PMR. Stable.  Anxiety. Stable.  Confusion/delirium secondary to a combination of Dilaudid and Ativan. Now resolved.       LOS: 4 days   Christina Fry 01/02/2011, 1:59 PM

## 2011-01-02 NOTE — Progress Notes (Signed)
  Subjective: Patient had another bowel movement last night. She also states that she had an episode of bright red blood per rectum last night. She only had a single episode of this and has not had any other episodes.  She denies any abdominal pain currently. No nausea. She's has had flatus.  Objective: Vital signs in last 24 hours: Temp:  [98.1 F (36.7 C)-99.8 F (37.7 C)] 98.1 F (36.7 C) (10/21 0600) Pulse Rate:  [75-84] 77  (10/21 0600) Resp:  [17-20] 20  (10/21 0600) BP: (112-136)/(62-76) 136/76 mmHg (10/21 0600) SpO2:  [95 %-97 %] 97 % (10/21 0600) Last BM Date: 01/02/11  Intake/Output from previous day: 10/20 0701 - 10/21 0700 In: 780 [P.O.:780] Out: 1900 [Urine:1900] Intake/Output this shift:    General appearance: alert and no distress GI: Positive bowel sounds, soft, obese, mild upper left and right upper quadrant abdominal tenderness with deep palpation. No peritoneal signs. Her ventral hernia is easily reducible. No pain with reduction of her hernia.  Lab Results:  @LABLAST2 (wbc:2,hgb:2,hct:2,plt:2) BMET  Basename 01/02/11 0340 01/01/11 0655  NA 136 142  K 4.0 3.7  CL 104 109  CO2 23 28  GLUCOSE 114* 106*  BUN 3* 6  CREATININE 1.25* 1.20*  CALCIUM 9.1 8.7   PT/INR No results found for this basename: LABPROT:2,INR:2 in the last 72 hours ABG No results found for this basename: PHART:2,PCO2:2,PO2:2,HCO3:2 in the last 72 hours  Studies/Results: No results found.  Anti-infectives: Anti-infectives    None      Assessment/Plan: s/p  Ileus versus small bowel obstruction. Patient was instructed to notify nursery she have any additional episodes of bright red blood per rectum. At this point I have a high suspicion of hemorrhoidal bleeding however should it persist additional workup will be required including possible endoscopy. As patient's symptoms have not progressed, I did discuss with the patient proceeding with small bowel follow-through tomorrow to  evaluate for any evidence of a mechanical etiology of her symptoms. Still having extremely low suspicion that her hernia is contributing to her symptoms her course.  LOS: 4 days    Miah Boye C 01/02/2011

## 2011-01-03 ENCOUNTER — Inpatient Hospital Stay (HOSPITAL_COMMUNITY): Payer: Non-veteran care

## 2011-01-03 LAB — COMPREHENSIVE METABOLIC PANEL
BUN: <3 mg/dL — ABNORMAL LOW (ref 6–23)
CO2: 29 mEq/L (ref 19–32)
Calcium: 9.4 mg/dL (ref 8.4–10.5)
Creatinine, Ser: 1.29 mg/dL — ABNORMAL HIGH (ref 0.50–1.10)
GFR calc Af Amer: 47 mL/min — ABNORMAL LOW (ref >90)
GFR calc non Af Amer: 40 mL/min — ABNORMAL LOW (ref >90)
Glucose, Bld: 101 mg/dL — ABNORMAL HIGH (ref 70–99)

## 2011-01-03 LAB — CBC
MCHC: 32.3 g/dL (ref 30.0–36.0)
Platelets: 241 10*3/uL (ref 150–400)
RDW: 13.4 % (ref 11.5–15.5)
WBC: 7 10*3/uL (ref 4.0–10.5)

## 2011-01-03 LAB — MAGNESIUM: Magnesium: 1.4 mg/dL — ABNORMAL LOW (ref 1.5–2.5)

## 2011-01-03 LAB — PHOSPHORUS: Phosphorus: 3.1 mg/dL (ref 2.3–4.6)

## 2011-01-03 MED ORDER — SODIUM CHLORIDE 0.9 % IJ SOLN
INTRAMUSCULAR | Status: AC
  Administered 2011-01-03: 10 mL
  Filled 2011-01-03: qty 10

## 2011-01-03 NOTE — Progress Notes (Signed)
  Subjective: Still with some nausea no emesis. Intermittent flatus no bowel movement today. No fevers or chills.  Objective: Vital signs in last 24 hours: Temp:  [97.8 F (36.6 C)-98.9 F (37.2 C)] 97.8 F (36.6 C) (10/22 0600) Pulse Rate:  [68-81] 76  (10/22 0600) Resp:  [19-20] 20  (10/22 0600) BP: (116-150)/(70-74) 129/74 mmHg (10/22 0600) SpO2:  [96 %-97 %] 96 % (10/22 0600) Last BM Date: 12/28/10  Intake/Output from previous day: 10/21 0701 - 10/22 0700 In: 360 [P.O.:360] Out: 800 [Urine:800] Intake/Output this shift:    General appearance: alert and no distress GI: Positive bowel sounds, soft, obese, mild to moderate bilateral upper quadrant tenderness. No peritoneal signs. Ventral hernia is easily reducible.  Lab Results:  @LABLAST2 (wbc:2,hgb:2,hct:2,plt:2) BMET  Basename 01/03/11 0611 01/02/11 0340  NA 139 136  K 4.6 4.0  CL 106 104  CO2 29 23  GLUCOSE 101* 114*  BUN <3* 3*  CREATININE 1.29* 1.25*  CALCIUM 9.4 9.1   PT/INR No results found for this basename: LABPROT:2,INR:2 in the last 72 hours ABG No results found for this basename: PHART:2,PCO2:2,PO2:2,HCO3:2 in the last 72 hours  Studies/Results: No results found.  Anti-infectives: Anti-infectives    None      Assessment/Plan: s/p  Ileus versus partial small bowel structure.  Small bowel follow-through is pending for today. Again as discussed with patient we will discuss surgical options should a true mechanical etiology be identified. Clinically I have an extremely low suspicion that the ventral hernia is contributing in any manner to her symptomatology. Again small bowel follow-through is pending L. base continued to termination of management based on the study.  LOS: 5 days    Ronnie Mallette C 01/03/2011

## 2011-01-03 NOTE — Progress Notes (Signed)
The patient's vital signs are hemodynamically stable. She remains mildly anemic. Her macrocytosis has resolved. Her vitamin B 12, folate, and TSH are within normal limits. She has renal insufficiency likely chronic and it appears to be at baseline. The patient was not seen today by the hospitalist service. We will follow her on a p.r.n.basis henceforth.

## 2011-01-04 ENCOUNTER — Inpatient Hospital Stay (HOSPITAL_COMMUNITY): Payer: Non-veteran care

## 2011-01-04 LAB — CBC
HCT: 33.1 % — ABNORMAL LOW (ref 36.0–46.0)
Hemoglobin: 10.7 g/dL — ABNORMAL LOW (ref 12.0–15.0)
MCHC: 32.3 g/dL (ref 30.0–36.0)
RBC: 3.36 MIL/uL — ABNORMAL LOW (ref 3.87–5.11)

## 2011-01-04 LAB — COMPREHENSIVE METABOLIC PANEL
AST: 10 U/L (ref 0–37)
Albumin: 2.5 g/dL — ABNORMAL LOW (ref 3.5–5.2)
Alkaline Phosphatase: 74 U/L (ref 39–117)
Chloride: 108 mEq/L (ref 96–112)
Creatinine, Ser: 1.29 mg/dL — ABNORMAL HIGH (ref 0.50–1.10)
Potassium: 4.7 mEq/L (ref 3.5–5.1)
Total Bilirubin: 0.3 mg/dL (ref 0.3–1.2)
Total Protein: 5.4 g/dL — ABNORMAL LOW (ref 6.0–8.3)

## 2011-01-04 LAB — PHOSPHORUS: Phosphorus: 3.8 mg/dL (ref 2.3–4.6)

## 2011-01-04 IMAGING — CR DG SMALL BOWEL
6 series · 6 of 6 positions shown · non-contrast
Comparison: None
Correlation:  CT abdomen pelvis [DATE]

CLINICAL DATA: Mild series abdominal pain question small bowel
obstruction

SMALL BOWEL FOLLOW THROUGH:
TECHNIQUE: Scout image was obtained.  Patient drank contrast and a
series of images of the small bowel were obtained.  Once contrast
had reached the colon, compression views of the terminal ileum were
performed.
Total fluoroscopy time: 1.1 minutes.

[view not recorded (1 of 6)]
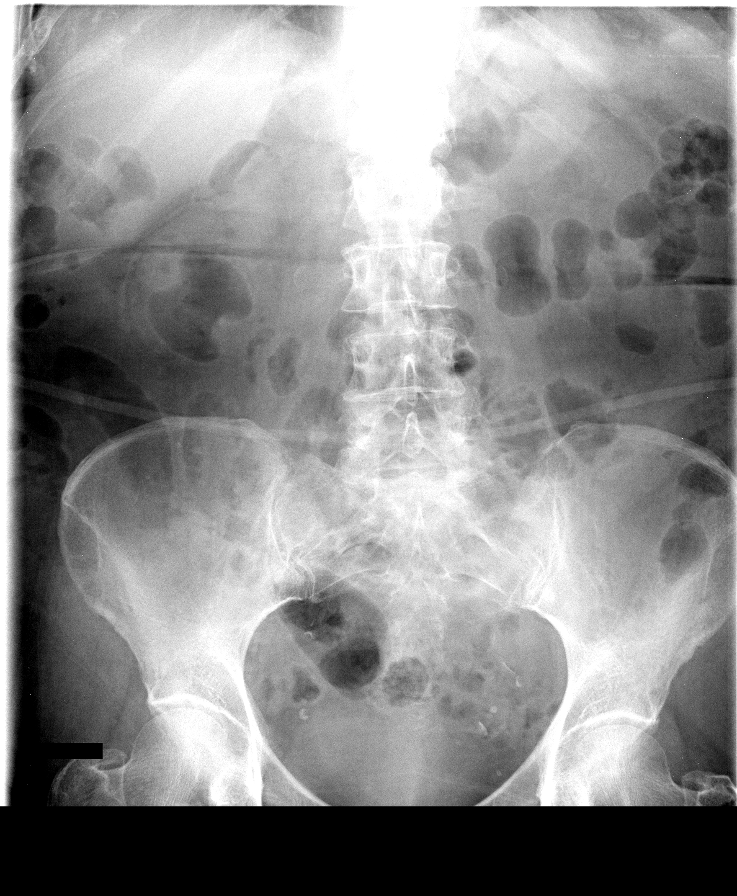

[view not recorded (2 of 6)]
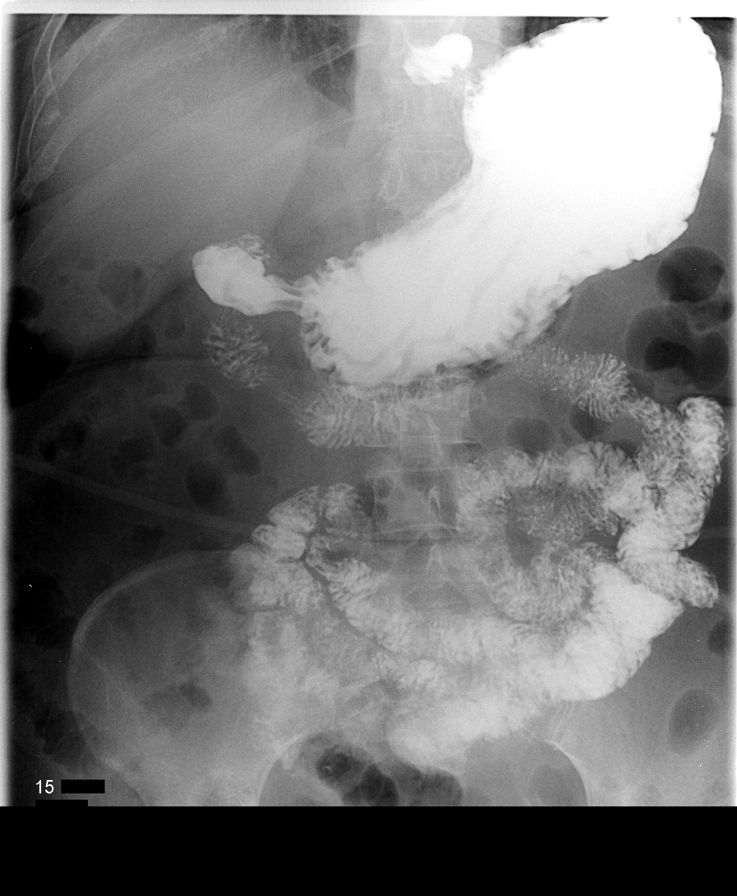

[view not recorded (3 of 6)]
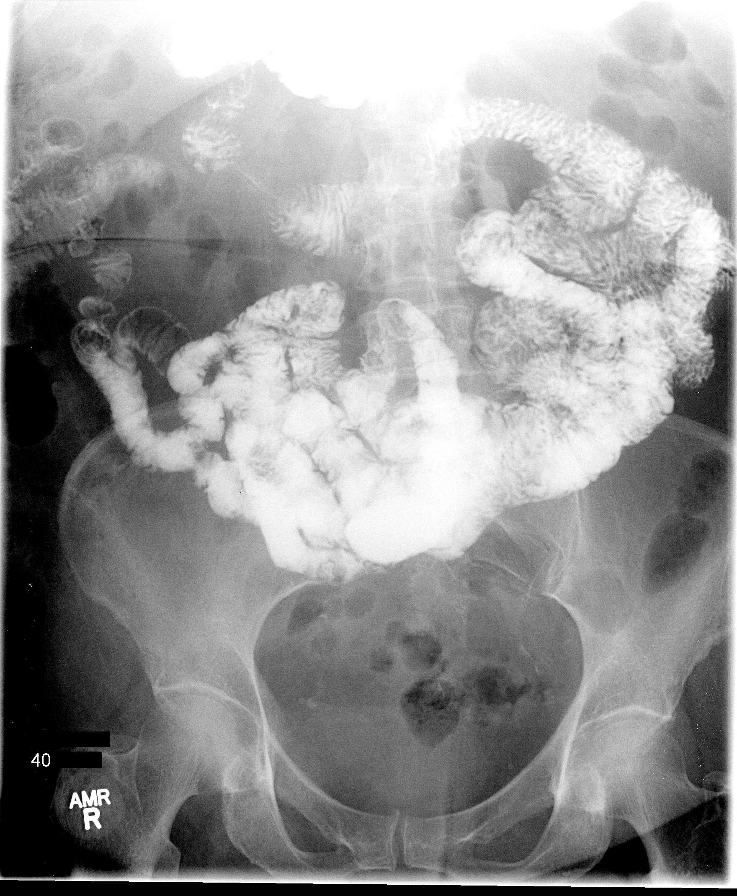

[view not recorded (4 of 6)]
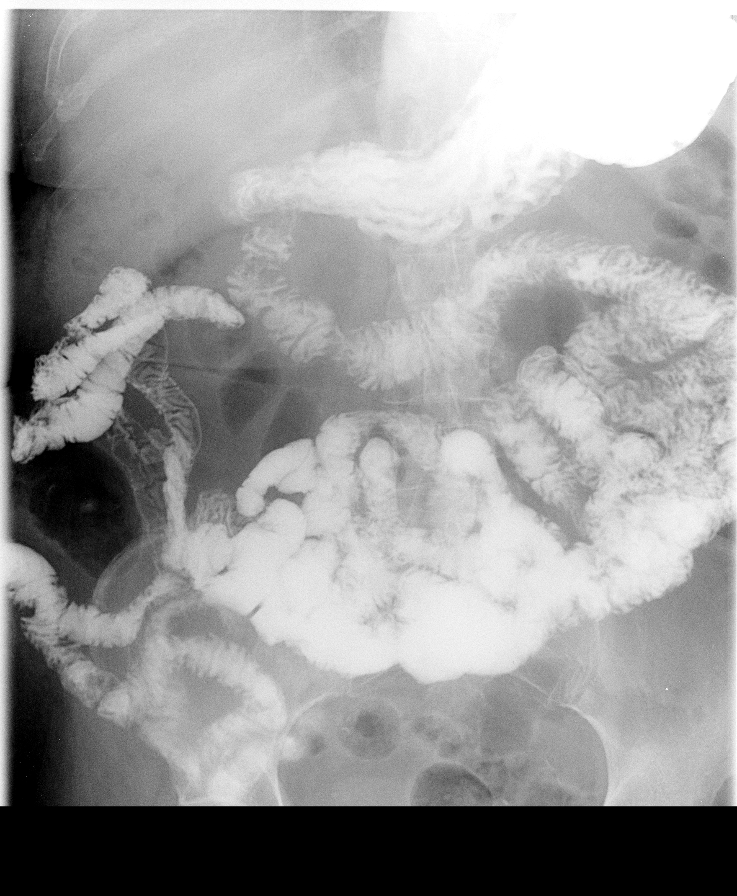

[view not recorded (5 of 6)]
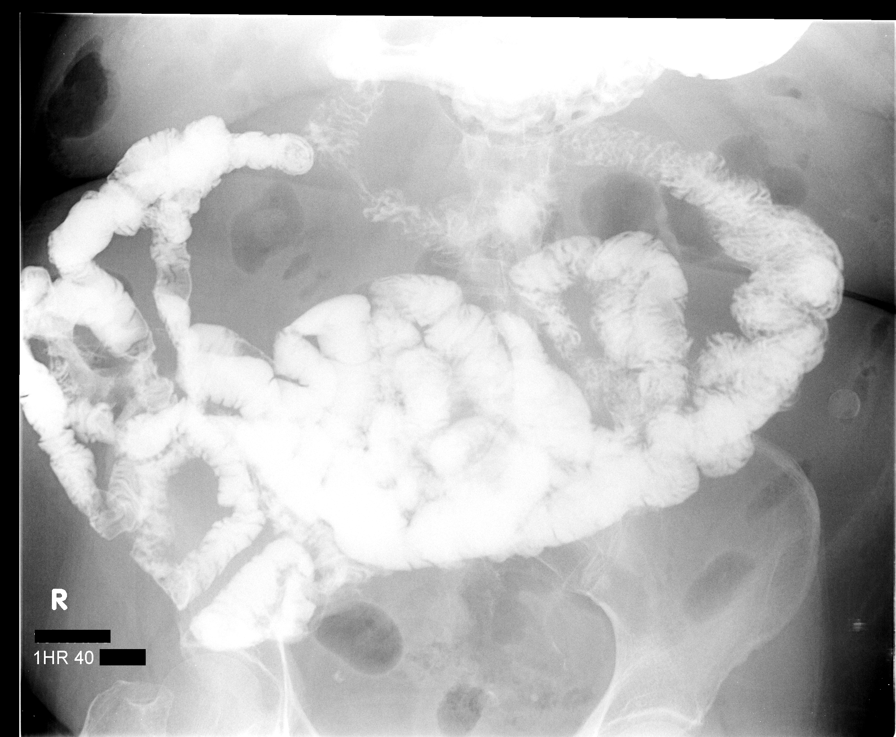

[view not recorded (6 of 6)]
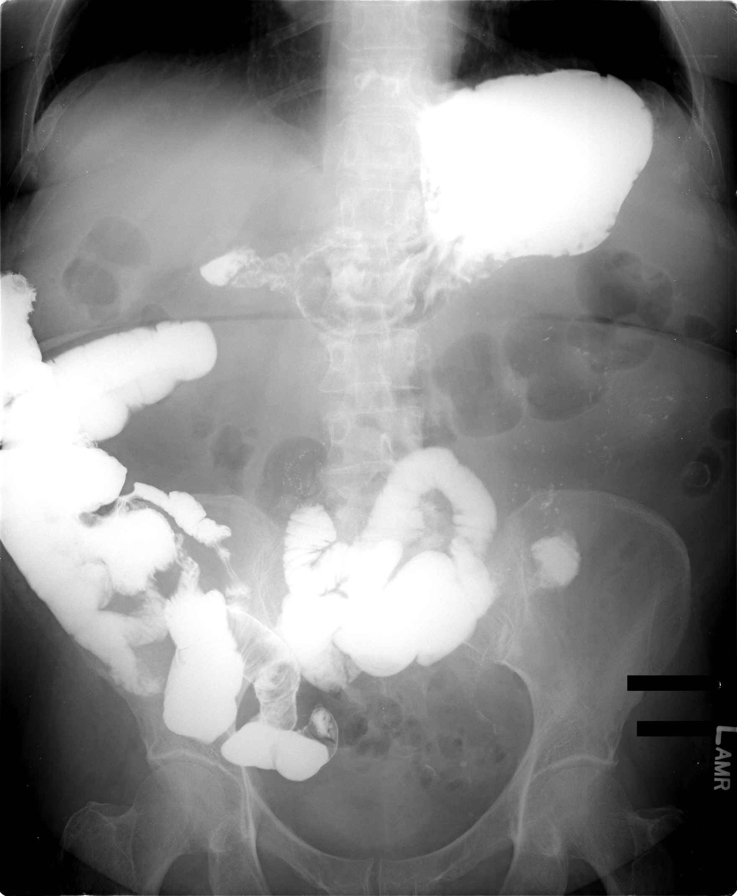

[6 of 6 positions shown; findings below may reference images not displayed]

FINDINGS: Normal bowel gas pattern on scout image.
No bowel dilatation or bowel wall thickening.
Following ingestion of contrast, a combination of thin barium and
Enterovue, serial images were obtained for 1 hour 40 minutes.

No gastric outlet obstruction.
Small hiatal hernia noted.
Normal appearance of the duodenal sweep.
Ligament Treitz normal position.
Jejunal and ileal loops demonstrate normal mucosal fold pattern.
No bowel dilatation, stricture, wall thickening, or obstruction.
Contrast present in the right colon at 1.7 5 hours.
Focal compression views of terminal ileum unremarkable.
Delayed image at 4.5 hours shows contrast has passed to the distal
ileum and right colon.
Appendix not visualized.
IMPRESSION: Unremarkable small bowel series.
Small hiatal hernia stenting noted.
Specifically, no evidence of small bowel dilatation or obstruction.

## 2011-01-04 IMAGING — RF DG SMALL BOWEL
9 series · 9 of 9 positions shown · non-contrast
Comparison: None
Correlation:  CT abdomen pelvis [DATE]

CLINICAL DATA: Mild series abdominal pain question small bowel
obstruction

SMALL BOWEL FOLLOW THROUGH:
TECHNIQUE: Scout image was obtained.  Patient drank contrast and a
series of images of the small bowel were obtained.  Once contrast
had reached the colon, compression views of the terminal ileum were
performed.
Total fluoroscopy time: 1.1 minutes.

[Series 1: run · 1 of 1 slices shown (1 of 9)]
[im 1/1]
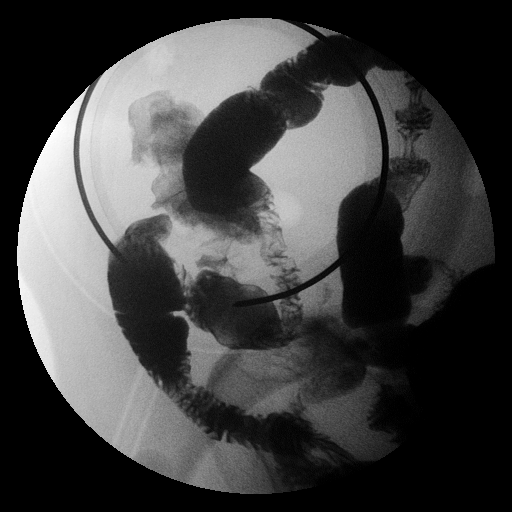

[Series 2: run · 1 of 1 slices shown (2 of 9)]
[im 1/1]
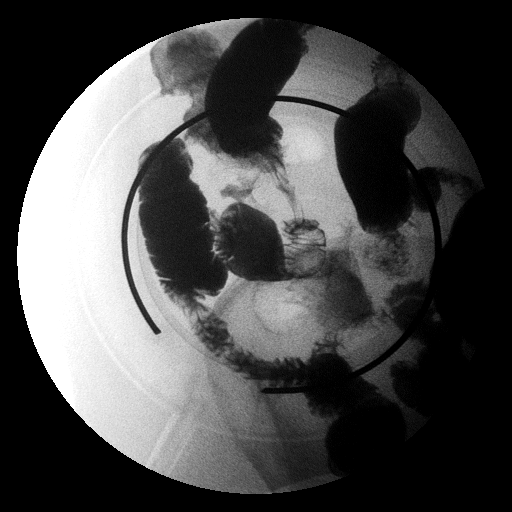

[Series 3: run · 1 of 1 slices shown (3 of 9)]
[im 1/1]
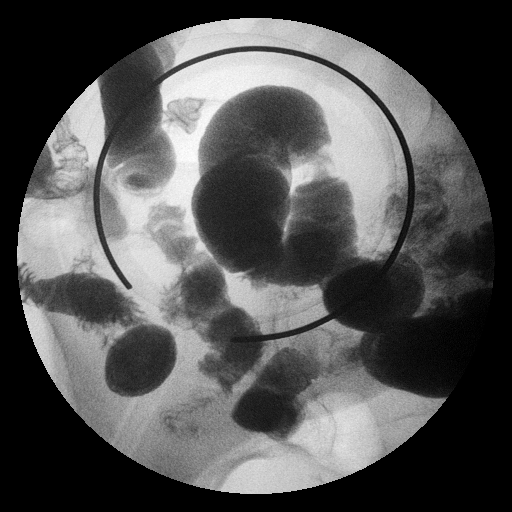

[Series 4: run · 1 of 1 slices shown (4 of 9)]
[im 1/1]
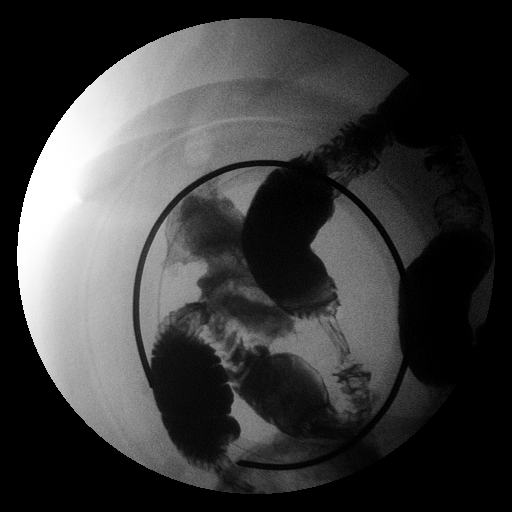

[Series 5: run · 1 of 1 slices shown (5 of 9)]
[im 1/1]
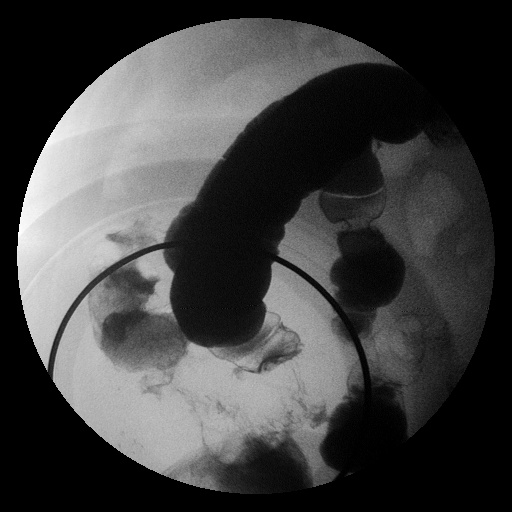

[Series 6: run · 1 of 1 slices shown (6 of 9)]
[im 1/1]
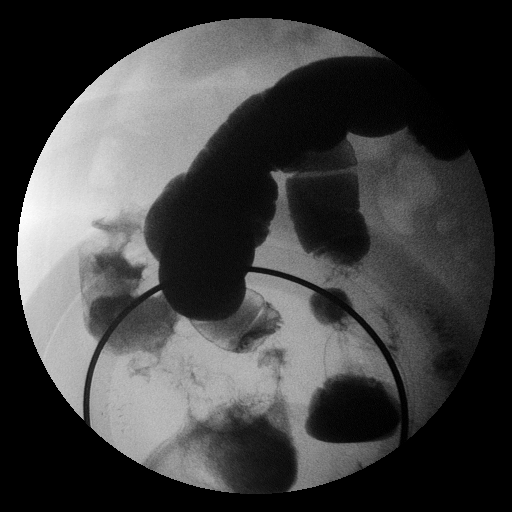

[Series 7: run · 1 of 1 slices shown (7 of 9)]
[im 1/1]
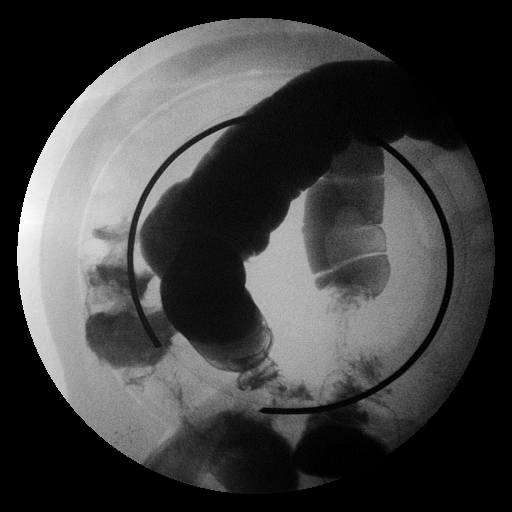

[Series 8: run · 1 of 1 slices shown (8 of 9)]
[im 1/1]
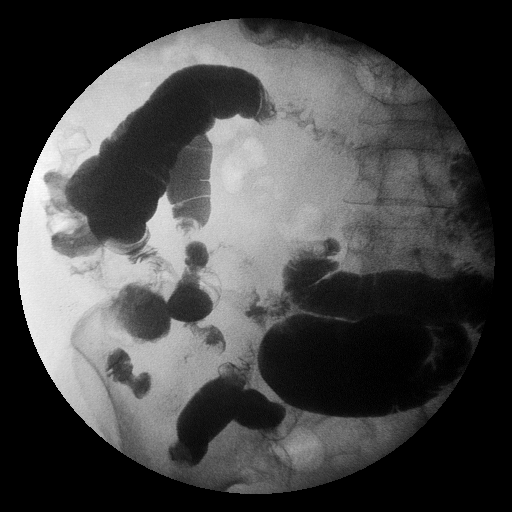

[Series 9: run · 1 of 1 slices shown (9 of 9)]
[im 1/1]
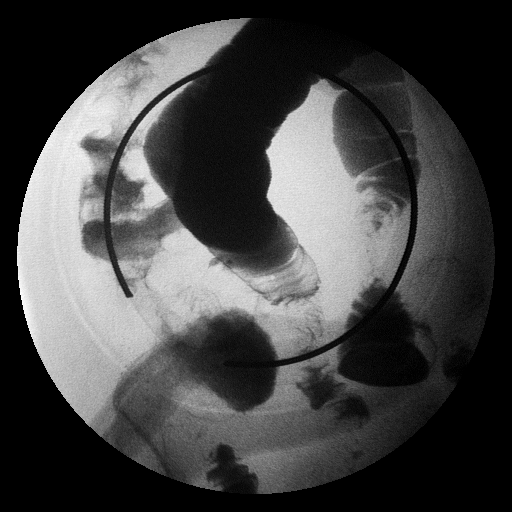

[9 of 9 positions shown; findings below may reference images not displayed]

FINDINGS: Normal bowel gas pattern on scout image.
No bowel dilatation or bowel wall thickening.
Following ingestion of contrast, a combination of thin barium and
Enterovue, serial images were obtained for 1 hour 40 minutes.

No gastric outlet obstruction.
Small hiatal hernia noted.
Normal appearance of the duodenal sweep.
Ligament Treitz normal position.
Jejunal and ileal loops demonstrate normal mucosal fold pattern.
No bowel dilatation, stricture, wall thickening, or obstruction.
Contrast present in the right colon at 1.7 5 hours.
Focal compression views of terminal ileum unremarkable.
Delayed image at 4.5 hours shows contrast has passed to the distal
ileum and right colon.
Appendix not visualized.
IMPRESSION: Unremarkable small bowel series.
Small hiatal hernia stenting noted.
Specifically, no evidence of small bowel dilatation or obstruction.

## 2011-01-04 MED ORDER — HYDROMORPHONE HCL 1 MG/ML IJ SOLN
1.0000 mg | INTRAMUSCULAR | Status: DC | PRN
Start: 2011-01-04 — End: 2011-01-04

## 2011-01-04 MED ORDER — METOCLOPRAMIDE HCL 10 MG PO TABS
10.0000 mg | ORAL_TABLET | Freq: Three times a day (TID) | ORAL | Status: DC
  Administered 2011-01-04 – 2011-01-06 (×5): 10 mg via ORAL
  Filled 2011-01-04 (×5): qty 1

## 2011-01-04 MED ORDER — HYDROMORPHONE HCL 1 MG/ML IJ SOLN
INTRAMUSCULAR | Status: AC
  Filled 2011-01-04: qty 1

## 2011-01-04 MED ORDER — HYDROMORPHONE HCL 1 MG/ML IJ SOLN
1.0000 mg | INTRAMUSCULAR | Status: AC | PRN
  Administered 2011-01-04: 1 mg via INTRAVENOUS

## 2011-01-04 NOTE — Progress Notes (Signed)
  Subjective: Still with some nausea.  No fevers or chills.  No new episode of GI bleed.  Objective: Vital signs in last 24 hours: Temp:  [97.8 F (36.6 C)] 97.8 F (36.6 C) (10/23 0658) Pulse Rate:  [77-79] 77  (10/23 0658) Resp:  [20] 20  (10/23 0658) BP: (125-167)/(70-79) 125/70 mmHg (10/23 0658) SpO2:  [96 %-98 %] 96 % (10/23 0658) Last BM Date: 12/28/10  Intake/Output from previous day: 10/22 0701 - 10/23 0700 In: 7391.7 [P.O.:960; I.V.:6431.7] Out: 900 [Urine:900] Intake/Output this shift:    General appearance: alert and no distress GI: soft, non-tender, no masses, hernia easily reducible.    Lab Results:  @LABLAST2 (wbc:2,hgb:2,hct:2,plt:2) BMET  Basename 01/04/11 0501 01/03/11 0611  NA 142 139  K 4.7 4.6  CL 108 106  CO2 29 29  GLUCOSE 94 101*  BUN 3* <3*  CREATININE 1.29* 1.29*  CALCIUM 9.2 9.4   PT/INR No results found for this basename: LABPROT:2,INR:2 in the last 72 hours ABG No results found for this basename: PHART:2,PCO2:2,PO2:2,HCO3:2 in the last 72 hours  Studies/Results: No results found.  Anti-infectives: Anti-infectives    None      Assessment/Plan: s/p  Results of SBFT discussed with patient (normal findings).  Will add reglan.  Hospitalist have signed-off.  No mechanical finding for symptoms.  IF nausea improved and diet tolerated, will continue work-up as out patient.  Patient agreeable.  Hopefully home in the next 24hrs pending diet.  LOS: 6 days    Renad Jenniges C 01/04/2011

## 2011-01-04 NOTE — Progress Notes (Signed)
The patient's vital signs are hemodynamically stable. She remains mildly anemic. Her macrocytosis has resolved. Her vitamin B 12, folate, and TSH are within normal limits. She has renal insufficiency likely chronic and it appears to be at baseline. Would recommend oral magnesium oxide 400 mg twice a day upon discharge. I have discussed this with Dr. Leticia Penna. We will sign off for now.

## 2011-01-05 LAB — CBC
HCT: 34.8 % — ABNORMAL LOW (ref 36.0–46.0)
MCH: 31.7 pg (ref 26.0–34.0)
MCV: 97.5 fL (ref 78.0–100.0)
RBC: 3.57 MIL/uL — ABNORMAL LOW (ref 3.87–5.11)
RDW: 13.3 % (ref 11.5–15.5)
WBC: 7.8 10*3/uL (ref 4.0–10.5)

## 2011-01-05 LAB — COMPREHENSIVE METABOLIC PANEL
ALT: 8 U/L (ref 0–35)
AST: 12 U/L (ref 0–37)
Alkaline Phosphatase: 80 U/L (ref 39–117)
CO2: 27 mEq/L (ref 19–32)
Calcium: 9.3 mg/dL (ref 8.4–10.5)
GFR calc non Af Amer: 42 mL/min — ABNORMAL LOW (ref >90)
Potassium: 4 mEq/L (ref 3.5–5.1)
Sodium: 139 mEq/L (ref 135–145)

## 2011-01-05 NOTE — Progress Notes (Signed)
  Subjective: Positive diarrhea. No nausea. Still hesitant about taking by mouth. No fevers or chills. No abdominal pain.  Objective: Vital signs in last 24 hours: Temp:  [98.4 F (36.9 C)-98.6 F (37 C)] 98.6 F (37 C) (10/24 0655) Pulse Rate:  [76-92] 76  (10/24 0655) Resp:  [20] 20  (10/24 0655) BP: (127-129)/(69-75) 127/69 mmHg (10/24 0655) SpO2:  [97 %-98 %] 98 % (10/24 0655) Last BM Date: 2011/01/10  Intake/Output from previous day: January 10, 2023 0701 - 10/24 0700 In: 345 [P.O.:345] Out: 1 [Stool:1] Intake/Output this shift:    General appearance: alert and no distress GI: Is about sounds, soft, obese, reducible nontender ventral hernia. No significant bowel tenderness. No peritoneal signs.  Lab Results:  @LABLAST2 (wbc:2,hgb:2,hct:2,plt:2) BMET  Basename 01/05/11 0610 2011-01-10 0501  NA 139 142  K 4.0 4.7  CL 104 108  CO2 27 29  GLUCOSE 88 94  BUN 4* 3*  CREATININE 1.26* 1.29*  CALCIUM 9.3 9.2   PT/INR No results found for this basename: LABPROT:2,INR:2 in the last 72 hours ABG No results found for this basename: PHART:2,PCO2:2,PO2:2,HCO3:2 in the last 72 hours  Studies/Results: Dg Small Bowel  January 10, 2011  *RADIOLOGY REPORT*  Clinical Data: Mild series abdominal pain question small bowel obstruction  SMALL BOWEL FOLLOW THROUGH:  Technique: Scout image was obtained.  Patient drank contrast and a series of images of the small bowel were obtained.  Once contrast had reached the colon, compression views of the terminal ileum were performed.  Total fluoroscopy time: 1.1 minutes.  Comparison: None Correlation:  CT abdomen pelvis 12/29/2010  Findings: Normal bowel gas pattern on scout image. No bowel dilatation or bowel wall thickening. Following ingestion of contrast, a combination of thin barium and Enterovue, serial images were obtained for 1 hour 40 minutes.  No gastric outlet obstruction. Small hiatal hernia noted. Normal appearance of the duodenal sweep. Ligament Treitz  normal position. Jejunal and ileal loops demonstrate normal mucosal fold pattern. No bowel dilatation, stricture, wall thickening, or obstruction. Contrast present in the right colon at 1.7 5 hours. Focal compression views of terminal ileum unremarkable. Delayed image at 4.5 hours shows contrast has passed to the distal ileum and right colon. Appendix not visualized.  IMPRESSION: Unremarkable small bowel series. Small hiatal hernia stenting noted. Specifically, no evidence of small bowel dilatation or obstruction.  Original Report Authenticated By: Lollie Marrow, M.D.    Anti-infectives: Anti-infectives    None      Assessment/Plan: s/p  The diarrhea is likely secondarily to her contrast from her small bowel follow-through. I reassured her regarding this. Continue to advance diet. If she is tolerating a regular diet we will plan to discharge patient first thing in the morning. Patient's comfortable with this decision.  LOS: 7 days    Harjot Dibello C 01/05/2011

## 2011-01-06 LAB — COMPREHENSIVE METABOLIC PANEL
BUN: 6 mg/dL (ref 6–23)
CO2: 29 mEq/L (ref 19–32)
Chloride: 105 mEq/L (ref 96–112)
Creatinine, Ser: 1.29 mg/dL — ABNORMAL HIGH (ref 0.50–1.10)
GFR calc non Af Amer: 40 mL/min — ABNORMAL LOW (ref >90)
Total Bilirubin: 0.3 mg/dL (ref 0.3–1.2)

## 2011-01-06 LAB — CBC
MCHC: 32.4 g/dL (ref 30.0–36.0)
Platelets: 275 10*3/uL (ref 150–400)
RDW: 13.4 % (ref 11.5–15.5)

## 2011-01-06 LAB — MAGNESIUM: Magnesium: 1.5 mg/dL (ref 1.5–2.5)

## 2011-01-06 LAB — PHOSPHORUS: Phosphorus: 3.9 mg/dL (ref 2.3–4.6)

## 2011-01-06 MED ORDER — METOCLOPRAMIDE HCL 10 MG PO TABS: 10.0000 mg | ORAL_TABLET | Freq: Three times a day (TID) | ORAL | Status: AC

## 2011-01-06 NOTE — Discharge Summary (Signed)
Physician Discharge Summary  Patient ID: MELI FALEY MRN: [id number] DOB/AGE: [birth year] 70 y.o.  Admit date: 12/29/2010 Discharge date: 01/06/2011  Admission Diagnoses:Nausea, vomiting, diarrhea  Discharge Diagnoses: resolved partial bowel obstruction / ileus Active Problems:  * No active hospital problems. *    Discharged Condition: good  Hospital Course: Patient was admitted for a 1 week history of increasing nausea and emesis.  Also had significant diarrhea.  Poor PO intake.  Was admitted for bowel rest, continued work-up and IV fluid replacement.  A true mechanical etiology was never discovered and while patient does have a reducible ventral hernia, this has clinically never been suspected.  Her symptoms did continue to improve and nausea improved with addition of PO reglan. Continued work-up and probable c-scope will be arranged as an out patient.  Consults: hospitalist service  Significant Diagnostic Studies: labs: daily labs, stool studies and radiology: CT scan: abd/pel and SBFT  Treatments: IV hydration  Discharge Exam: Blood pressure 109/62, pulse 70, temperature 98 F (36.7 C), temperature source Oral, resp. rate 18, height 5\' 3"  (1.6 m), weight 81.2 kg (179 lb 0.2 oz), SpO2 96.00%. General appearance: alert and no distress Eyes: PERR, EOMI, no conj palor.  Bilateral proptosis Resp: clear to auscultation bilaterally Cardio: regular rate and rhythm GI: soft, non-tender; bowel sounds normal; no masses,  no organomegaly reducible ventral hernia  Disposition: Final discharge disposition not confirmed  Discharge Orders    Future Orders Please Complete By Expires   Diet - low sodium heart healthy      Increase activity slowly      Discharge instructions      Comments:   High fiber diet   Call MD for:  temperature >100.4      Call MD for:  persistant nausea and vomiting      Call MD for:  severe uncontrolled pain        Current Discharge Medication List    START  taking these medications   Details  metoCLOPramide (REGLAN) 10 MG tablet Take 1 tablet (10 mg total) by mouth 3 (three) times daily before meals. Qty: 60 tablet, Refills: 1      CONTINUE these medications which have NOT CHANGED   Details  acyclovir (ZOVIRAX) 200 MG capsule Take 400 mg by mouth 2 (two) times daily.      aspirin EC 81 MG tablet Take 81 mg by mouth daily.      Calcium Carbonate-Vitamin D (CALCIUM 600+D HIGH POTENCY) 600-400 MG-UNIT per tablet Take 1 tablet by mouth daily.      clonazePAM (KLONOPIN) 0.5 MG tablet Take 0.25 mg by mouth 3 (three) times daily as needed. Take only for significant anxiety     ferrous gluconate (FERGON) 324 MG tablet Take 324 mg by mouth at bedtime.      fluconazole (DIFLUCAN) 150 MG tablet Take 150 mg by mouth daily.      gabapentin (NEURONTIN) 100 MG capsule Take 200 mg by mouth at bedtime.      HYDROcodone-acetaminophen (NORCO) 10-325 MG per tablet Take 1-2 tablets by mouth 2 (two) times daily as needed. pain     hydroxychloroquine (PLAQUENIL) 200 MG tablet Take 400 mg by mouth daily.      hydroxypropyl methylcellulose (ISOPTO TEARS) 2.5 % ophthalmic solution Place 1 drop into both eyes daily as needed. Dry eyes     levothyroxine (SYNTHROID, LEVOTHROID) 112 MCG tablet Take 112 mcg by mouth daily.      metoprolol tartrate (LOPRESSOR) 25 MG tablet  Take 25 mg by mouth 2 (two) times daily.      Multiple Vitamins-Minerals (MULTIVITAMINS THER. W/MINERALS) TABS Take 1 tablet by mouth daily.      nortriptyline (PAMELOR) 25 MG capsule Take 25 mg by mouth at bedtime.      omeprazole (PRILOSEC) 20 MG capsule Take 40 mg by mouth 2 (two) times daily.      oxybutynin (DITROPAN) 5 MG tablet Take 5 mg by mouth daily. This is the long acting med     predniSONE (DELTASONE) 1 MG tablet Take 3 mg by mouth daily.      simvastatin (ZOCOR) 80 MG tablet Take 40 mg by mouth at bedtime.      acetaminophen (TYLENOL) 325 MG tablet Take 650 mg by mouth every  6 (six) hours as needed. pain       STOP taking these medications     docusate sodium (COLACE) 100 MG capsule        Follow-up Information    Follow up with Harriett Azar C in 2 weeks.   Contact information:   [address] [place] [address] [phone]          Signed: Fabio Bering 01/06/2011, 8:09 AM

## 2012-03-16 ENCOUNTER — Emergency Department (HOSPITAL_COMMUNITY): Payer: Medicare Other

## 2012-03-16 ENCOUNTER — Inpatient Hospital Stay (HOSPITAL_COMMUNITY)
Admission: EM | Admit: 2012-03-16 | Discharge: 2012-03-26 | DRG: 354 | Disposition: A | Discharge status: Home / Self Care | Payer: Medicare Other | Attending: General Surgery | Admitting: General Surgery

## 2012-03-16 ENCOUNTER — Encounter (HOSPITAL_COMMUNITY): Payer: Self-pay | Admitting: *Deleted

## 2012-03-16 DIAGNOSIS — E0500 Thyrotoxicosis with diffuse goiter without thyrotoxic crisis or storm: Secondary | ICD-10-CM | POA: Diagnosis present

## 2012-03-16 DIAGNOSIS — E785 Hyperlipidemia, unspecified: Secondary | ICD-10-CM | POA: Diagnosis present

## 2012-03-16 DIAGNOSIS — K430 Incisional hernia with obstruction, without gangrene: Principal | ICD-10-CM | POA: Diagnosis present

## 2012-03-16 DIAGNOSIS — E119 Type 2 diabetes mellitus without complications: Secondary | ICD-10-CM | POA: Diagnosis present

## 2012-03-16 DIAGNOSIS — R112 Nausea with vomiting, unspecified: Secondary | ICD-10-CM | POA: Diagnosis present

## 2012-03-16 DIAGNOSIS — Z79899 Other long term (current) drug therapy: Secondary | ICD-10-CM

## 2012-03-16 DIAGNOSIS — F411 Generalized anxiety disorder: Secondary | ICD-10-CM | POA: Diagnosis present

## 2012-03-16 DIAGNOSIS — D638 Anemia in other chronic diseases classified elsewhere: Secondary | ICD-10-CM | POA: Diagnosis present

## 2012-03-16 DIAGNOSIS — K560 Paralytic ileus: Secondary | ICD-10-CM | POA: Diagnosis not present

## 2012-03-16 DIAGNOSIS — IMO0001 Reserved for inherently not codable concepts without codable children: Secondary | ICD-10-CM | POA: Diagnosis present

## 2012-03-16 DIAGNOSIS — Z90710 Acquired absence of both cervix and uterus: Secondary | ICD-10-CM

## 2012-03-16 DIAGNOSIS — K436 Other and unspecified ventral hernia with obstruction, without gangrene: Secondary | ICD-10-CM

## 2012-03-16 DIAGNOSIS — E876 Hypokalemia: Secondary | ICD-10-CM | POA: Diagnosis not present

## 2012-03-16 DIAGNOSIS — K56609 Unspecified intestinal obstruction, unspecified as to partial versus complete obstruction: Secondary | ICD-10-CM

## 2012-03-16 DIAGNOSIS — Z7982 Long term (current) use of aspirin: Secondary | ICD-10-CM

## 2012-03-16 DIAGNOSIS — K219 Gastro-esophageal reflux disease without esophagitis: Secondary | ICD-10-CM | POA: Diagnosis present

## 2012-03-16 HISTORY — DX: Type 2 diabetes mellitus without complications: E11.9

## 2012-03-16 LAB — COMPREHENSIVE METABOLIC PANEL
ALT: 13 U/L (ref 0–35)
Alkaline Phosphatase: 88 U/L (ref 39–117)
CO2: 30 mEq/L (ref 19–32)
GFR calc Af Amer: 48 mL/min — ABNORMAL LOW (ref >90)
Glucose, Bld: 98 mg/dL (ref 70–99)
Potassium: 3.5 mEq/L (ref 3.5–5.1)
Sodium: 138 mEq/L (ref 135–145)
Total Protein: 6.8 g/dL (ref 6.0–8.3)

## 2012-03-16 LAB — CBC WITH DIFFERENTIAL/PLATELET
Eosinophils Absolute: 0.1 10*3/uL (ref 0.0–0.7)
Lymphocytes Relative: 16 % (ref 12–46)
Lymphs Abs: 2.2 10*3/uL (ref 0.7–4.0)
Neutrophils Relative %: 74 % (ref 43–77)
Platelets: 268 10*3/uL (ref 150–400)
RBC: 4.27 MIL/uL (ref 3.87–5.11)
WBC: 13.4 10*3/uL — ABNORMAL HIGH (ref 4.0–10.5)

## 2012-03-16 LAB — GLUCOSE, CAPILLARY: Glucose-Capillary: 91 mg/dL (ref 70–99)

## 2012-03-16 IMAGING — CR DG ABDOMEN ACUTE W/ 1V CHEST
3 series · 3 of 3 positions shown · non-contrast
Comparison: [DATE]

CLINICAL DATA: Emesis.  Abdominal pain.  Cough.

ACUTE ABDOMEN SERIES (ABDOMEN 2 VIEW & CHEST 1 VIEW)

[view not recorded (1 of 3)]
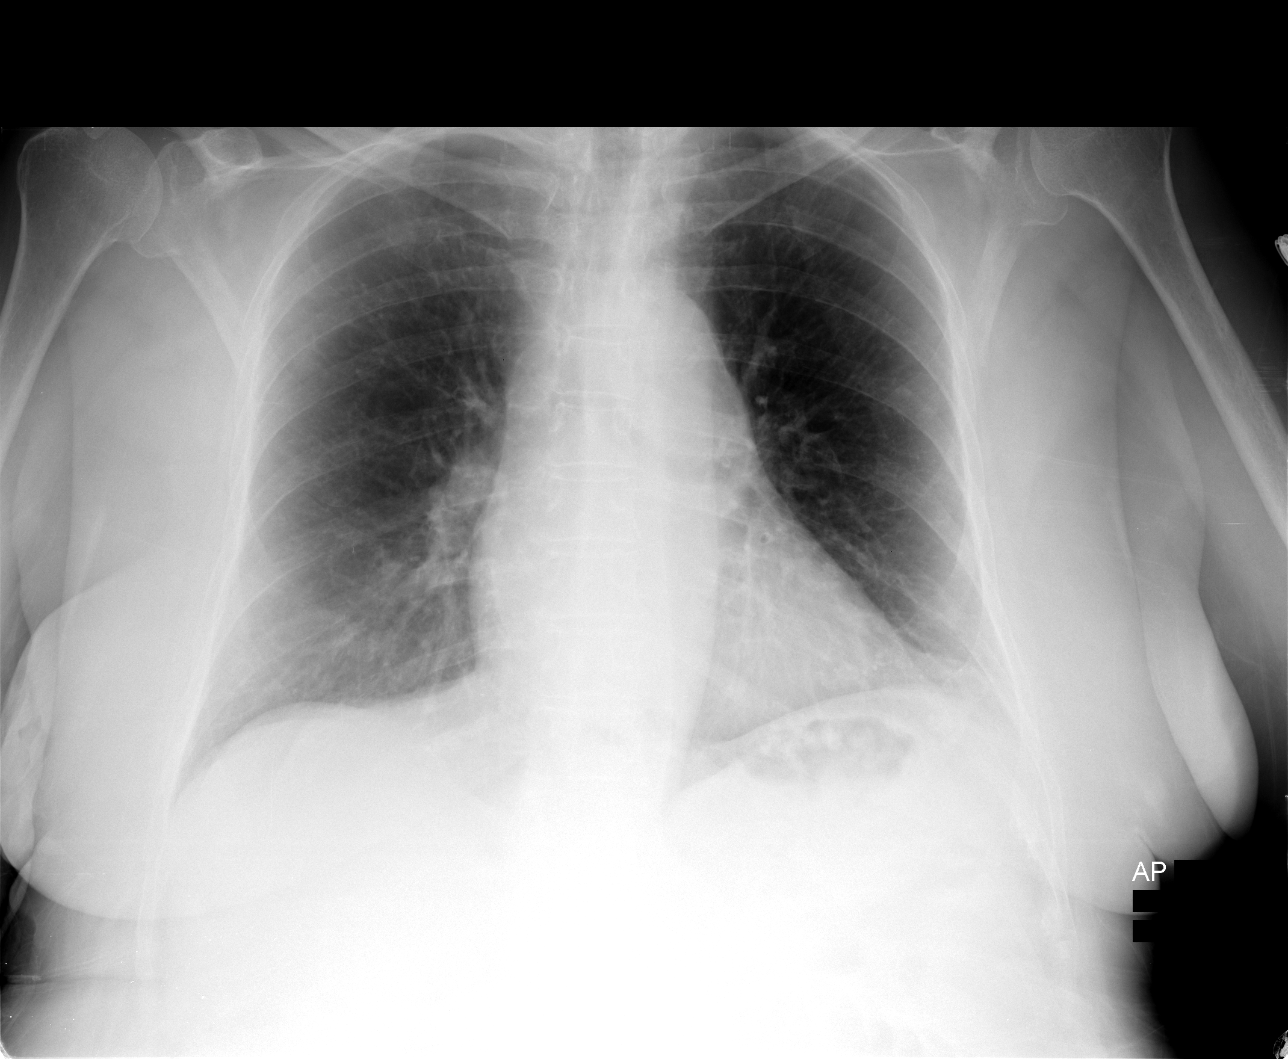

[view not recorded (2 of 3)]
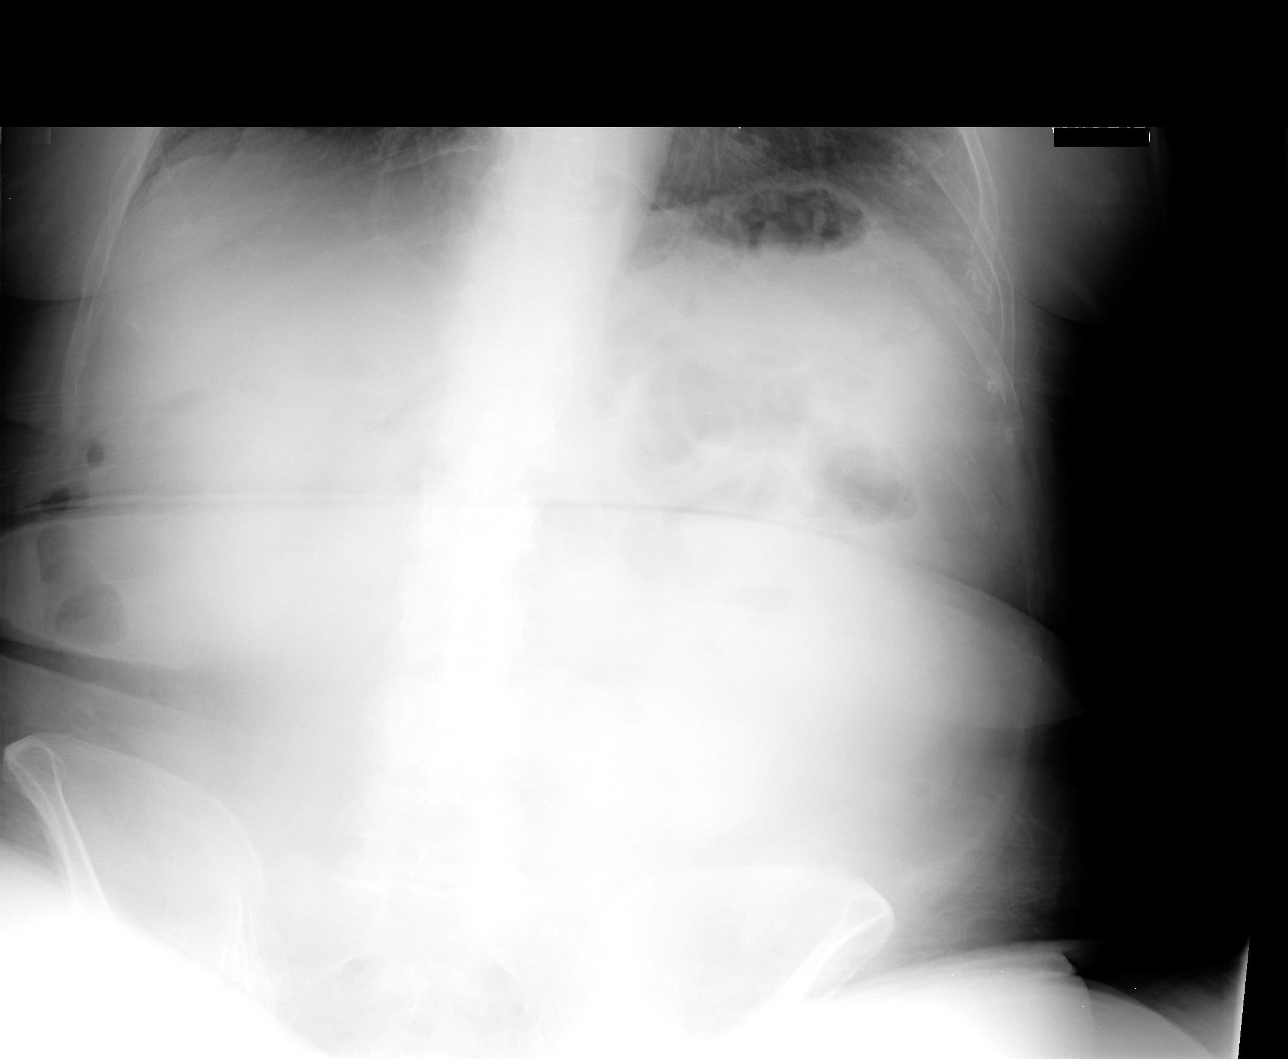

[view not recorded (3 of 3)]
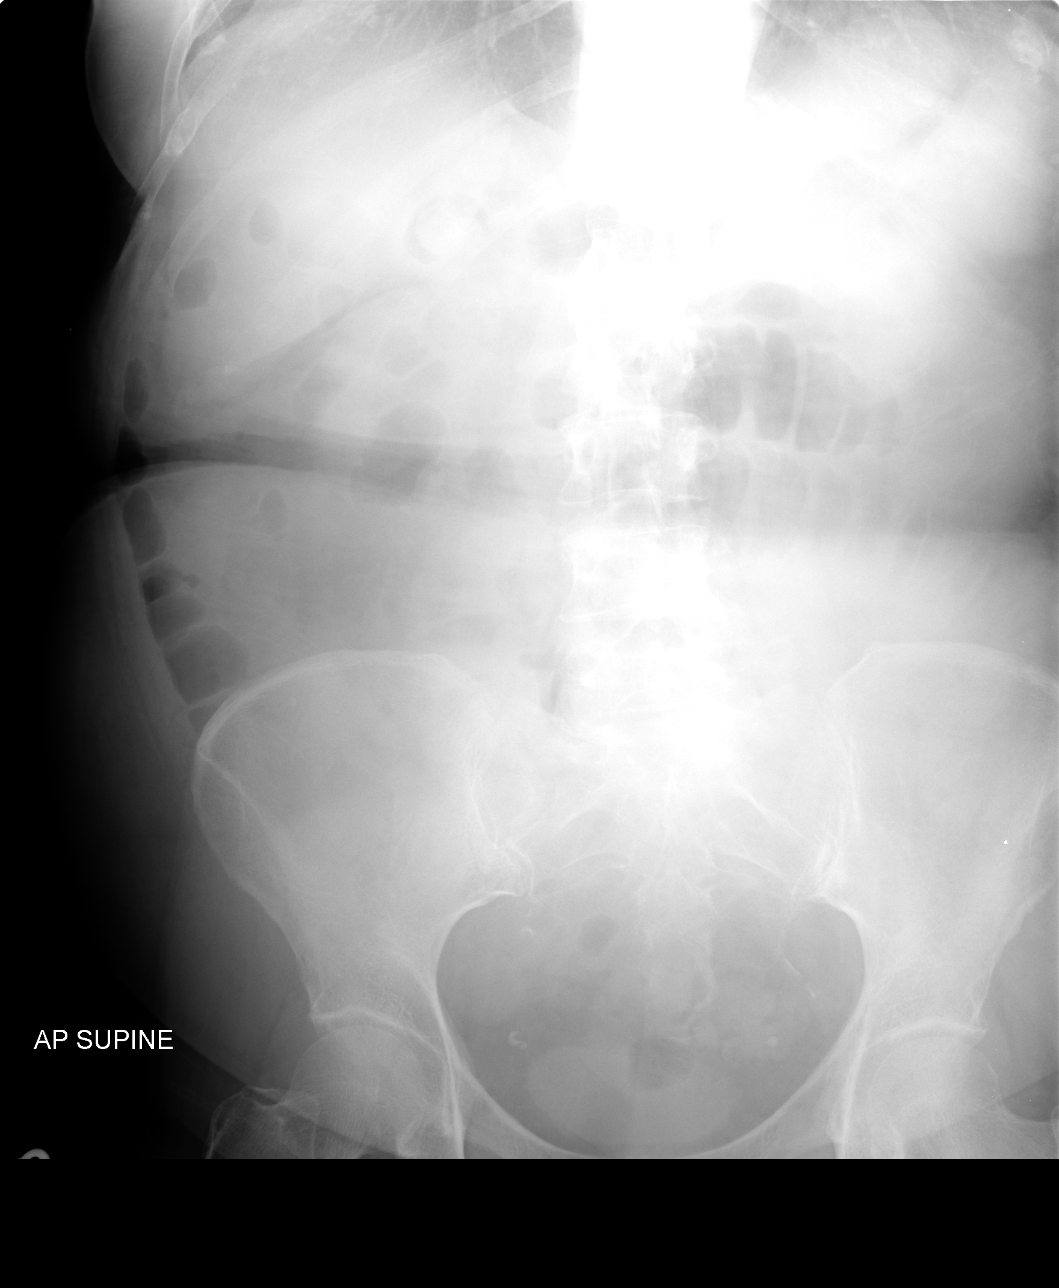

[3 of 3 positions shown; findings below may reference images not displayed]

FINDINGS: There are dilated fluid air filled loops of small
intestine in the central abdomen suggesting partial small bowel
obstruction.  There is a small amount of gas in the colon.  No free
air.  No worrisome calcifications or acute bony findings.

One-view chest shows normal heart and mediastinal shadows.  Lungs
are clear.  No free air.
IMPRESSION: Dilated fluid and air filled loops of small bowel the central
abdomen suggesting partial small bowel obstruction.

## 2012-03-16 IMAGING — CT CT ABD-PELV W/O CM
2 of 4 series · 16 of 46 positions shown, 18 images · non-contrast
Comparison: Multiple exams, including [DATE] radiographs, and
prior CT scan of [DATE]

CLINICAL DATA: Abdominal pain and nausea.  IV contrast allergy.

CT ABDOMEN AND PELVIS WITHOUT CONTRAST
TECHNIQUE: Multidetector CT imaging of the abdomen and pelvis was
performed following the standard protocol without intravenous
contrast.

[Series 2: abdomen/pelvis w/o contrast · axial · non-contrast · 0.72mm/px · z∈[-439,-74]mm · 13 of 85 slices shown, 15 images]
[im 6/85  soft-tissue]
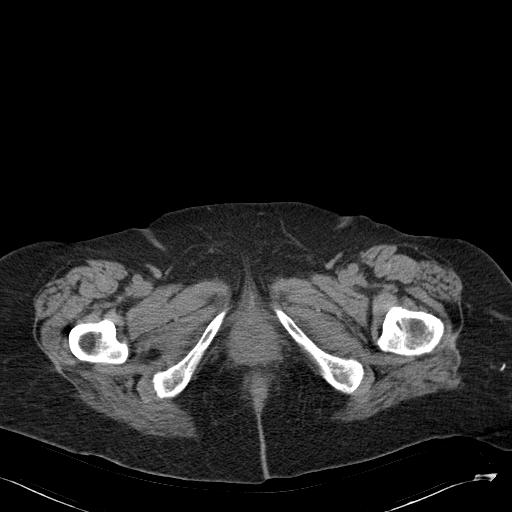
[im 6/85  bone]
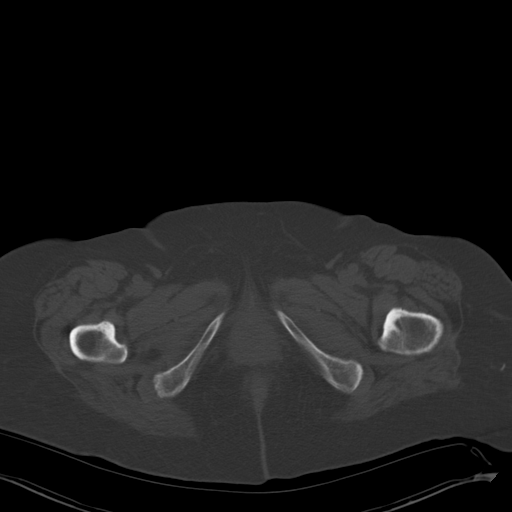
[im 12/85  soft-tissue]
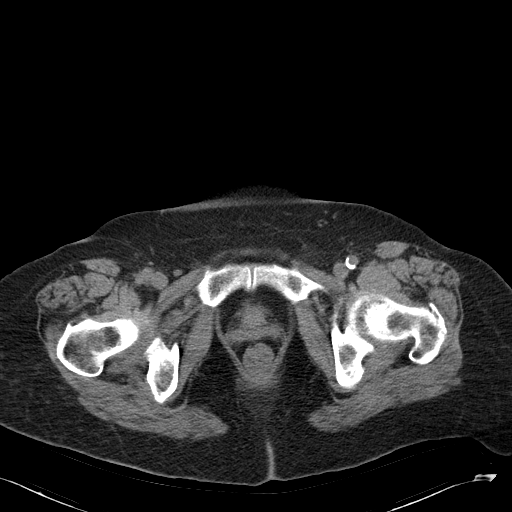
[im 17/85  soft-tissue]
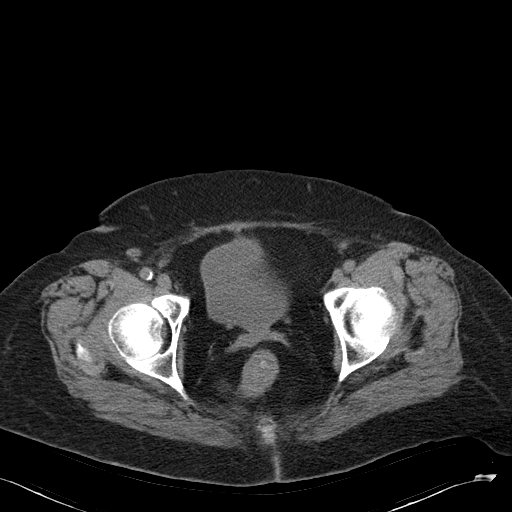
[im 23/85  soft-tissue]
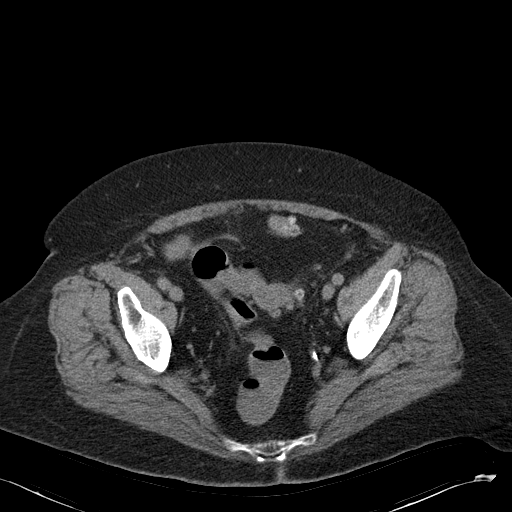
[im 29/85  soft-tissue]
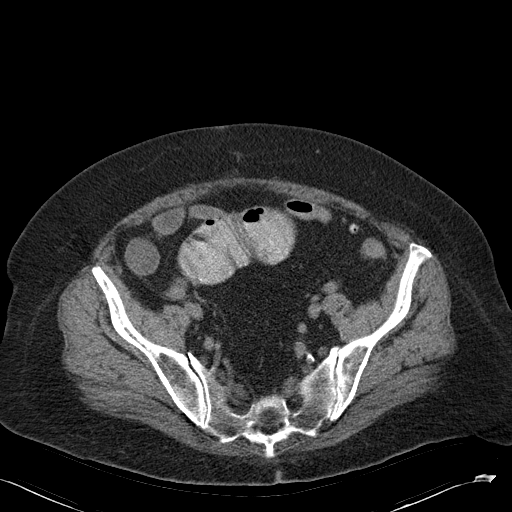
[im 34/85  soft-tissue]
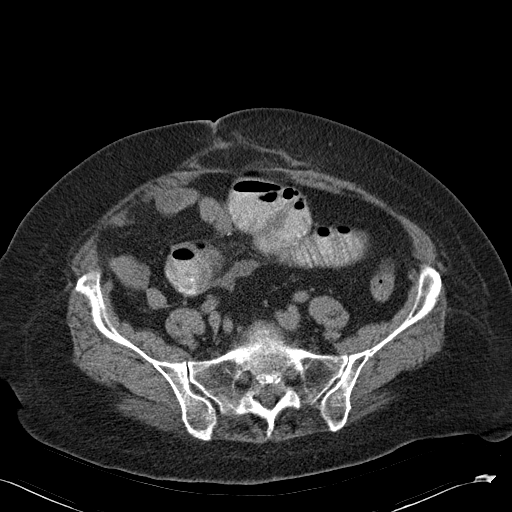
[im 45/85  soft-tissue]
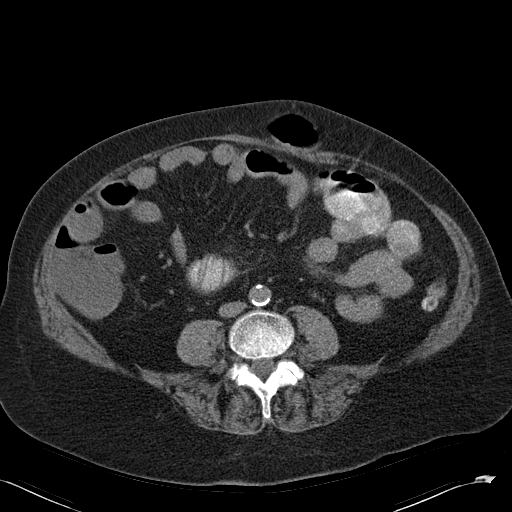
[im 51/85  soft-tissue]
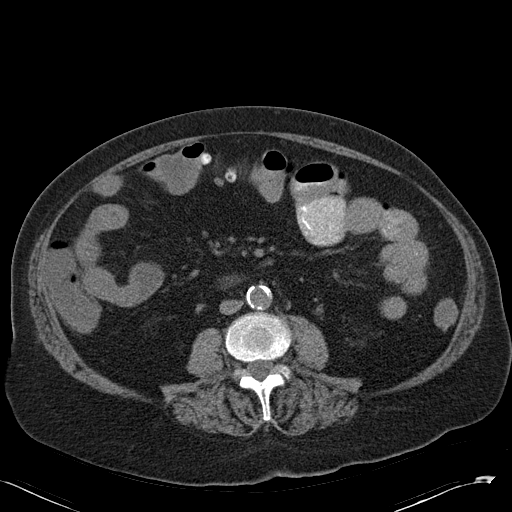
[im 57/85  soft-tissue]
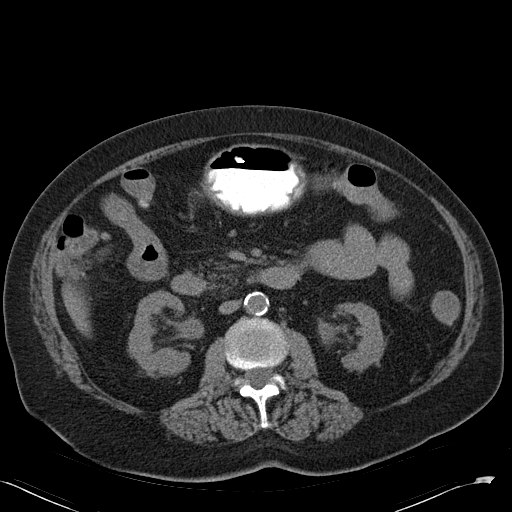
[im 57/85  bone]
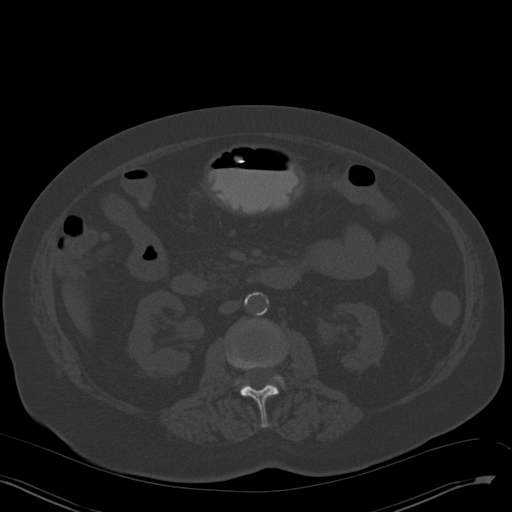
[im 62/85  soft-tissue]
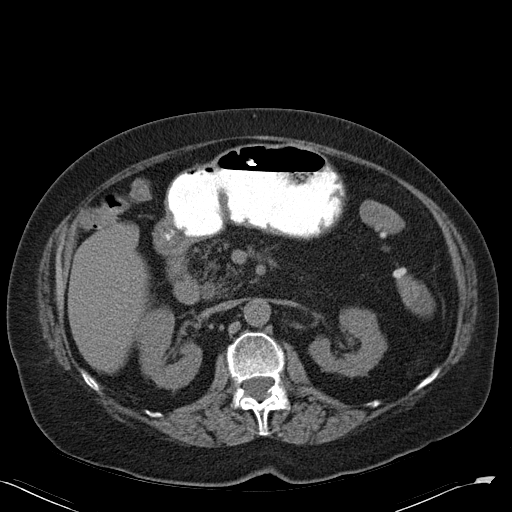
[im 68/85  soft-tissue]
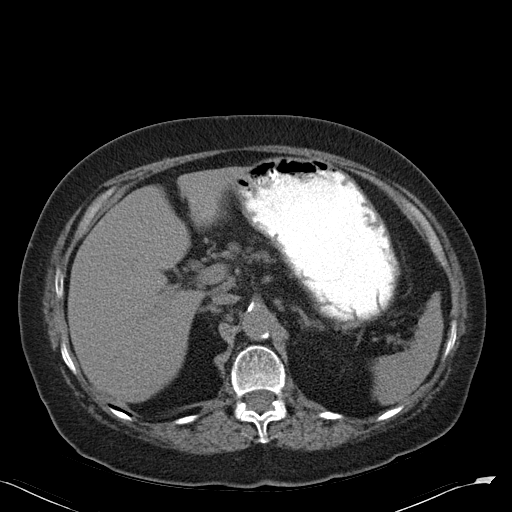
[im 73/85  soft-tissue]
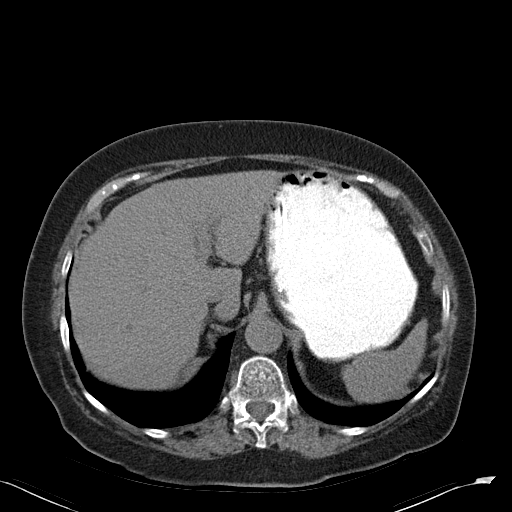
[im 79/85  soft-tissue]
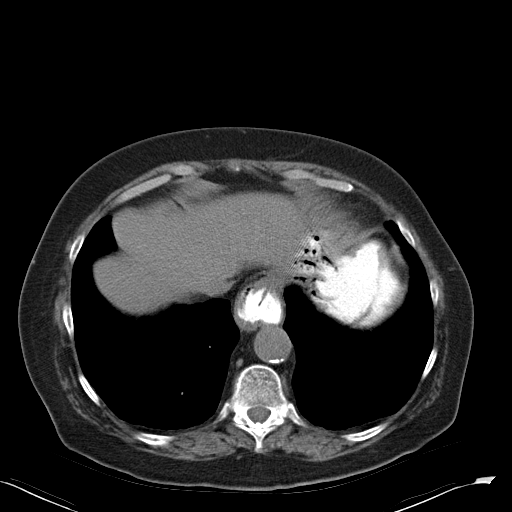

[Series 4: mpr cor (id) · coronal · 0.83mm/px · 3 of 102 slices shown]
[im 34/102  soft-tissue]
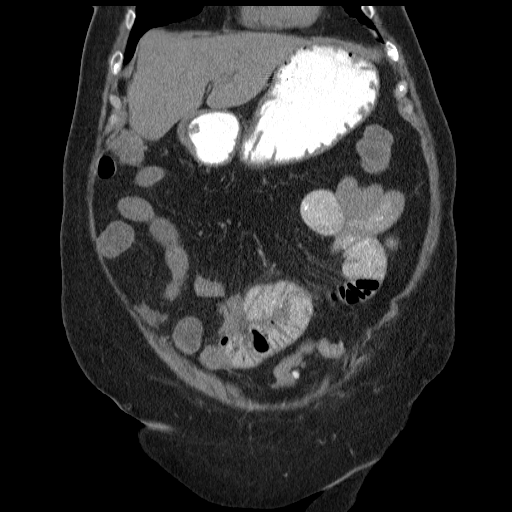
[im 45/102  soft-tissue]
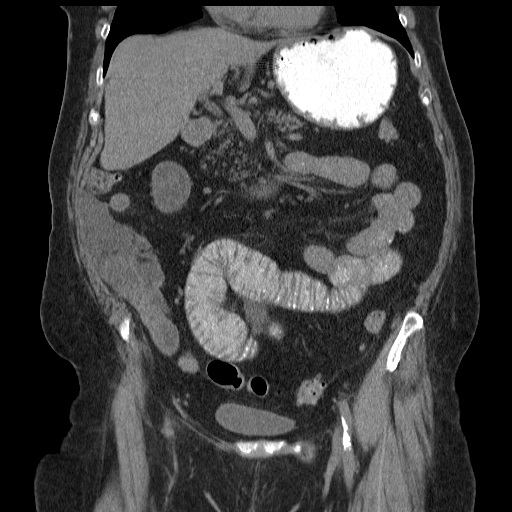
[im 57/102  soft-tissue]
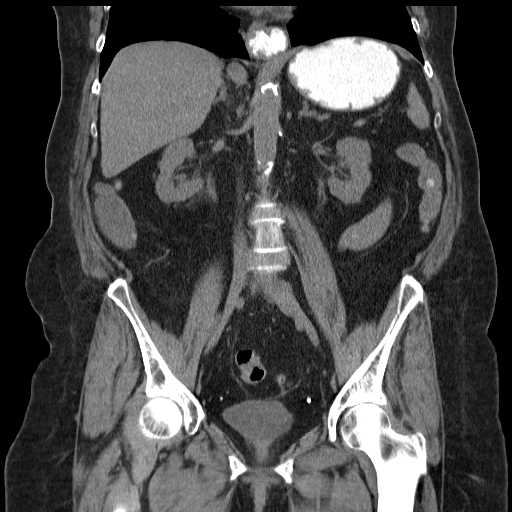

[16 of 46 positions shown; findings below may reference images not displayed]

FINDINGS: Small hiatal hernia noted.  A nasogastric tube into the
stomach body.  There is contrast medium in the hiatal hernia.

The visualized portion of the liver, spleen, pancreas, and adrenal
glands appear unremarkable in noncontrast CT appearance.

Gallbladder not visualized, likely surgically absent.  Aortoiliac
atherosclerotic calcification noted.  Perirenal stranding appears
chronic and bilateral; no calculi observed.

No pathologic retroperitoneal or porta hepatis adenopathy is
identified.  No pathologic pelvic adenopathy is identified.

Left paramidline ventral hernia contains a loop of small bowel and
represents the transition point from the dilated to nondilated
small bowel, with the bowel just proximal to this measuring up to 4
cm in diameter.  No extraluminal gas.  A small amount of contrast
medium does extend distal to this area of obstruction. A small
amount of ascites is present along the dilated loops of bowel, as
shown on image 47 of series 4.

Chronic barium noted in colonic diverticula.  Sigmoid
diverticulosis noted.  Air-fluid levels are present distally in the
colon and in the rectosigmoid region.

Urinary bladder unremarkable.  Uterus absent.  No adnexal mass.
IMPRESSION: 1.  Small bowel obstruction due to a left paramedian ventral
herniation of a loop of small bowel.  No extraluminal gas or
abscess identified, although there is a small amount of ascites
along the dilated loops of bowel.
2.  Sigmoid diverticulosis.
3.  Air-fluid levels are present in the distal colon, as can be
encountered in the setting of diarrheal process.
4.  Small hiatal hernia.
5.  Atherosclerosis.

## 2012-03-16 MED ORDER — HYDROMORPHONE HCL PF 1 MG/ML IJ SOLN
1.0000 mg | Freq: Once | INTRAMUSCULAR | Status: AC
  Administered 2012-03-16: 1 mg via INTRAVENOUS

## 2012-03-16 MED ORDER — INSULIN ASPART 100 UNIT/ML ~~LOC~~ SOLN: 0.0000 [IU] | Freq: Three times a day (TID) | SUBCUTANEOUS | Status: DC

## 2012-03-16 MED ORDER — ONDANSETRON HCL 4 MG/2ML IJ SOLN
4.0000 mg | Freq: Once | INTRAMUSCULAR | Status: AC
  Administered 2012-03-16: 4 mg via INTRAVENOUS
  Filled 2012-03-16: qty 2

## 2012-03-16 MED ORDER — ONDANSETRON HCL 4 MG/2ML IJ SOLN: 4.0000 mg | Freq: Three times a day (TID) | INTRAMUSCULAR | Status: DC | PRN

## 2012-03-16 MED ORDER — LIDOCAINE VISCOUS 2 % MT SOLN
20.0000 mL | Freq: Once | OROMUCOSAL | Status: AC
  Administered 2012-03-16: 20 mL via OROMUCOSAL
  Filled 2012-03-16: qty 15

## 2012-03-16 MED ORDER — HYDROMORPHONE HCL PF 1 MG/ML IJ SOLN
INTRAMUSCULAR | Status: AC
  Administered 2012-03-16: 1 mg via INTRAVENOUS
  Filled 2012-03-16: qty 1

## 2012-03-16 MED ORDER — HYDROMORPHONE HCL PF 1 MG/ML IJ SOLN
1.0000 mg | Freq: Once | INTRAMUSCULAR | Status: AC
  Administered 2012-03-16: 1 mg via INTRAVENOUS
  Filled 2012-03-16 (×2): qty 1

## 2012-03-16 MED ORDER — ONDANSETRON HCL 4 MG/2ML IJ SOLN
4.0000 mg | Freq: Once | INTRAMUSCULAR | Status: AC
  Administered 2012-03-16: 4 mg via INTRAVENOUS

## 2012-03-16 MED ORDER — HYDROMORPHONE HCL PF 1 MG/ML IJ SOLN
INTRAMUSCULAR | Status: AC
  Filled 2012-03-16: qty 1

## 2012-03-16 MED ORDER — HYDROMORPHONE HCL PF 1 MG/ML IJ SOLN
1.0000 mg | INTRAMUSCULAR | Status: DC | PRN
  Administered 2012-03-16 – 2012-03-17 (×2): 1 mg via INTRAVENOUS
  Filled 2012-03-16 (×3): qty 1

## 2012-03-16 MED ORDER — LACTATED RINGERS IV SOLN
INTRAVENOUS | Status: AC
  Administered 2012-03-16: 1000 mL via INTRAVENOUS

## 2012-03-16 MED ORDER — ONDANSETRON HCL 4 MG/2ML IJ SOLN
INTRAMUSCULAR | Status: AC
  Filled 2012-03-16: qty 2

## 2012-03-16 NOTE — ED Notes (Signed)
1st bottle of barium administered via NGT. Pt tolerated well. 2nd bottle to be administered at 1700. Upon initially entering room after receiving pt, gauge on suction not working. Gauge to be switched while pt is in CT.

## 2012-03-16 NOTE — ED Notes (Addendum)
Patient states that she wants pain medications prior to going to xray, states she wants Dilaudid, MD made aware of patient requests.  No new orders received.  Pt agreeable to go to xray.

## 2012-03-16 NOTE — ED Provider Notes (Signed)
History     CSN: [id number]  Arrival date & time 03/16/12  1142   First MD Initiated Contact with Patient 03/16/12 1303      Chief Complaint  Patient presents with  . Emesis  . Abdominal Pain    (Consider location/radiation/quality/duration/timing/severity/associated sxs/prior treatment) HPI Comments: Patient reports this has been sick for 3 days. She has had fever, chills, cough, abdominal pain with nausea, vomiting and diarrhea. Patient reports that she cannot hold anything down. Vomiting and diarrhea have been frequent and voluminous. Fever has been low-grade, around 100. Pain is crampy and diffuse in the abdomen, moderate to severe at times.  Patient is a 72 y.o. female presenting with vomiting and abdominal pain.  Emesis  Associated symptoms include abdominal pain, cough and a fever.  Abdominal Pain The primary symptoms of the illness include abdominal pain, fever, nausea and vomiting.    Past Medical History  Diagnosis Date  . Grave's disease   . Anxiety   . GERD (gastroesophageal reflux disease)   . Hyperlipidemia   . Herpes   . Polymyalgia rheumatica   . Fibromyalgia   . Palpitation   . Diabetes mellitus without complication     Past Surgical History  Procedure Date  . Appendectomy   . Abdominal hysterectomy   . Hernia repair   . Carpal tunnel release   . Total abdominal hysterectomy w/ bilateral salpingoophorectomy     No family history on file.  History  Substance Use Topics  . Smoking status: Never Smoker   . Smokeless tobacco: Not on file  . Alcohol Use: No    OB History    Grav Para Term Preterm Abortions TAB SAB Ect Mult Living                  Review of Systems  Constitutional: Positive for fever.  Respiratory: Positive for cough.   Gastrointestinal: Positive for nausea, vomiting and abdominal pain.  All other systems reviewed and are negative.    Allergies  Iohexol; Codeine; Morphine and related; Nitrofuran derivatives; and  Oxycodone-acetaminophen  Home Medications   Current Outpatient Rx  Name  Route  Sig  Dispense  Refill  . ACETAMINOPHEN 325 MG PO TABS   Oral   Take 650 mg by mouth every 6 (six) hours as needed. pain          . ACYCLOVIR 200 MG PO CAPS   Oral   Take 400 mg by mouth 2 (two) times daily.           . ASPIRIN EC 81 MG PO TBEC   Oral   Take 81 mg by mouth daily.           Marland Kitchen CALCIUM CARBONATE-VITAMIN D 600-400 MG-UNIT PO TABS   Oral   Take 1 tablet by mouth daily.           Marland Kitchen CLONAZEPAM 0.5 MG PO TABS   Oral   Take 0.25 mg by mouth 3 (three) times daily as needed. Take only for significant anxiety          . FERROUS GLUCONATE 324 (38 FE) MG PO TABS   Oral   Take 324 mg by mouth at bedtime.           Marland Kitchen FLUCONAZOLE 150 MG PO TABS   Oral   Take 150 mg by mouth daily.           Marland Kitchen GABAPENTIN 100 MG PO CAPS   Oral   Take 200  mg by mouth at bedtime.           Marland Kitchen HYDROCODONE-ACETAMINOPHEN 10-325 MG PO TABS   Oral   Take 1-2 tablets by mouth 2 (two) times daily as needed. pain          . HYDROXYCHLOROQUINE SULFATE 200 MG PO TABS   Oral   Take 400 mg by mouth daily.           Marland Kitchen HYPROMELLOSE 2.5 % OP SOLN   Both Eyes   Place 1 drop into both eyes daily as needed. Dry eyes          . LEVOTHYROXINE SODIUM 112 MCG PO TABS   Oral   Take 112 mcg by mouth daily.           Marland Kitchen METOPROLOL TARTRATE 25 MG PO TABS   Oral   Take 25 mg by mouth 2 (two) times daily.           Carma Leaven M PLUS PO TABS   Oral   Take 1 tablet by mouth daily.           Marland Kitchen NORTRIPTYLINE HCL 25 MG PO CAPS   Oral   Take 25 mg by mouth at bedtime.           . OMEPRAZOLE 20 MG PO CPDR   Oral   Take 40 mg by mouth 2 (two) times daily.           . OXYBUTYNIN CHLORIDE 5 MG PO TABS   Oral   Take 5 mg by mouth daily. This is the long acting med          . PREDNISONE 1 MG PO TABS   Oral   Take 3 mg by mouth daily.           Marland Kitchen SIMVASTATIN 80 MG PO TABS   Oral   Take 40  mg by mouth at bedtime.             BP 137/62  Pulse 78  Temp 98.5 F (36.9 C) (Oral)  Resp 20  Ht 5\' 3"  (1.6 m)  Wt 165 lb (74.844 kg)  BMI 29.23 kg/m2  SpO2 99%  Physical Exam  Constitutional: She is oriented to person, place, and time. She appears well-developed and well-nourished. No distress.  HENT:  Head: Normocephalic and atraumatic.  Right Ear: Hearing normal.  Nose: Nose normal.  Mouth/Throat: Oropharynx is clear and moist and mucous membranes are normal.  Eyes: Conjunctivae normal and EOM are normal. Pupils are equal, round, and reactive to light.  Neck: Normal range of motion. Neck supple.  Cardiovascular: Normal rate, regular rhythm, S1 normal and S2 normal.  Exam reveals no gallop and no friction rub.   No murmur heard. Pulmonary/Chest: Effort normal and breath sounds normal. No respiratory distress. She exhibits no tenderness.  Abdominal: Soft. Normal appearance and bowel sounds are normal. There is no hepatosplenomegaly. There is no tenderness. There is no rebound, no guarding, no tenderness at McBurney's point and negative Murphy's sign. No hernia.  Musculoskeletal: Normal range of motion.  Neurological: She is alert and oriented to person, place, and time. She has normal strength. No cranial nerve deficit or sensory deficit. Coordination normal. GCS eye subscore is 4. GCS verbal subscore is 5. GCS motor subscore is 6.  Skin: Skin is warm, dry and intact. No rash noted. No cyanosis.  Psychiatric: She has a normal mood and affect. Her speech is normal and behavior is normal. Thought  content normal.    ED Course  Procedures (including critical care time)  Labs Reviewed  CBC WITH DIFFERENTIAL - Abnormal; Notable for the following:    WBC 13.4 (*)     Neutro Abs 9.9 (*)     Monocytes Absolute 1.2 (*)     All other components within normal limits  COMPREHENSIVE METABOLIC PANEL - Abnormal; Notable for the following:    BUN 26 (*)     Creatinine, Ser 1.25 (*)       GFR calc non Af Amer 42 (*)     GFR calc Af Amer 48 (*)     All other components within normal limits  LIPASE, BLOOD   Dg Abd Acute W/chest  03/16/2012  *RADIOLOGY REPORT*  Clinical Data: Emesis.  Abdominal pain.  Cough.  ACUTE ABDOMEN SERIES (ABDOMEN 2 VIEW & CHEST 1 VIEW)  Comparison: 06/19/2011  Findings: There are dilated fluid air filled loops of small intestine in the central abdomen suggesting partial small bowel obstruction.  There is a small amount of gas in the colon.  No free air.  No worrisome calcifications or acute bony findings.  One-view chest shows normal heart and mediastinal shadows.  Lungs are clear.  No free air.  IMPRESSION: Dilated fluid and air filled loops of small bowel the central abdomen suggesting partial small bowel obstruction.   Original Report Authenticated By: Paulina Fusi, M.D.      No diagnosis found.    MDM  Patient seen with nausea, vomiting and diarrhea. Originally took a viral syndrome was considered, but it was learned that the patient had similar presentation a year ago and ended up having a small bowel obstruction. Her workup was concerning for small bowel obstruction once again. Plain film showed air-fluid levels consistent with at least partial small bowel obstruction. Case was discussed with Dr. Lovell Sheehan who did come and evaluate the patient here in the ER. He felt a ventral incisional hernia that he was able to reduce. He recommended performing a CAT scan and determining the patient's disposition based on that. He felt that if the patient no longer was obstructed and a CAT scan after reduction of the hernia that she could be discharged to home. The CAT scan was performed and once again shows obstruction secondary to the hernia. I did go back and reexamined the patient and once again she had an incarcerated incisional hernia that I was able to reduce here in the ER, but that she will require repair. Case was discussed with Dr. Lovell Sheehan again he will admit  the patient to the hospital for ultimate surgical repair.        Gilda Crease, MD 03/16/12 [phone]

## 2012-03-16 NOTE — ED Notes (Signed)
Pt presents via [facility] secondary to N/V/D x 4 days. Pt lives at home alone.

## 2012-03-16 NOTE — ED Notes (Signed)
MD at bedside. Pt hooked back on low intermittent suction as per MD. Pain requesting something additional for pain at this time. MD made aware.

## 2012-03-16 NOTE — ED Notes (Signed)
Pt sat on BSC for ~ . Playing on Ipad, NAD. States pain and nausea are much better. Pt had BM. Pt to go to CT at ~1800.

## 2012-03-17 LAB — GLUCOSE, CAPILLARY: Glucose-Capillary: 92 mg/dL (ref 70–99)

## 2012-03-17 MED ORDER — ACETAMINOPHEN 650 MG RE SUPP: 650.0000 mg | Freq: Four times a day (QID) | RECTAL | Status: DC | PRN

## 2012-03-17 MED ORDER — ENOXAPARIN SODIUM 40 MG/0.4ML ~~LOC~~ SOLN
40.0000 mg | SUBCUTANEOUS | Status: DC
  Administered 2012-03-17 – 2012-03-20 (×4): 40 mg via SUBCUTANEOUS
  Filled 2012-03-17 (×3): qty 0.4

## 2012-03-17 MED ORDER — POLYVINYL ALCOHOL 1.4 % OP SOLN
1.0000 [drp] | OPHTHALMIC | Status: DC | PRN
  Administered 2012-03-17: 1 [drp] via OPHTHALMIC
  Filled 2012-03-17: qty 15

## 2012-03-17 MED ORDER — HYDROMORPHONE HCL PF 1 MG/ML IJ SOLN
1.0000 mg | INTRAMUSCULAR | Status: DC | PRN
  Administered 2012-03-17 – 2012-03-20 (×14): 1 mg via INTRAVENOUS
  Filled 2012-03-17 (×14): qty 1

## 2012-03-17 MED ORDER — KCL IN DEXTROSE-NACL 20-5-0.45 MEQ/L-%-% IV SOLN
INTRAVENOUS | Status: DC
  Administered 2012-03-17: 1000 mL via INTRAVENOUS
  Administered 2012-03-17: 11:00:00 via INTRAVENOUS
  Administered 2012-03-18: 1000 mL via INTRAVENOUS
  Administered 2012-03-18: 20:00:00 via INTRAVENOUS

## 2012-03-17 MED ORDER — LEVOTHYROXINE SODIUM 100 MCG IV SOLR
50.0000 ug | Freq: Every day | INTRAVENOUS | Status: DC
  Administered 2012-03-17 – 2012-03-19 (×3): 50 ug via INTRAVENOUS
  Filled 2012-03-17 (×7): qty 2.5

## 2012-03-17 MED ORDER — ACETAMINOPHEN 325 MG PO TABS: 650.0000 mg | ORAL_TABLET | Freq: Four times a day (QID) | ORAL | Status: DC | PRN

## 2012-03-17 MED ORDER — ONDANSETRON HCL 4 MG/2ML IJ SOLN: 4.0000 mg | Freq: Four times a day (QID) | INTRAMUSCULAR | Status: DC | PRN

## 2012-03-17 MED ORDER — CLOTRIMAZOLE 1 % EX CREA
1.0000 "application " | TOPICAL_CREAM | Freq: Two times a day (BID) | CUTANEOUS | Status: DC
  Administered 2012-03-17 – 2012-03-19 (×6): 1 via TOPICAL
  Filled 2012-03-17: qty 15

## 2012-03-17 MED ORDER — HYPROMELLOSE (GONIOSCOPIC) 2.5 % OP SOLN
1.0000 [drp] | Freq: Every day | OPHTHALMIC | Status: DC | PRN
  Filled 2012-03-17: qty 15

## 2012-03-17 MED ORDER — METOPROLOL TARTRATE 1 MG/ML IV SOLN
2.5000 mg | Freq: Two times a day (BID) | INTRAVENOUS | Status: DC
  Administered 2012-03-17 – 2012-03-19 (×6): 2.5 mg via INTRAVENOUS
  Filled 2012-03-17 (×6): qty 5

## 2012-03-17 MED ORDER — ARTIFICIAL TEARS OP OINT
1.0000 "application " | TOPICAL_OINTMENT | Freq: Every day | OPHTHALMIC | Status: DC
  Administered 2012-03-17 – 2012-03-19 (×3): 1 via OPHTHALMIC
  Filled 2012-03-17: qty 3.5

## 2012-03-17 MED ORDER — CYCLOSPORINE 0.05 % OP EMUL
1.0000 [drp] | Freq: Every day | OPHTHALMIC | Status: DC
  Administered 2012-03-17 – 2012-03-19 (×3): 1 [drp] via OPHTHALMIC
  Filled 2012-03-17 (×5): qty 1

## 2012-03-17 NOTE — H&P (Signed)
Christina Fry is an 72 y.o. female.   Chief Complaint: Nausea and vomiting HPI: Patient is a 73 year old white female who presented to the emergency room with a three-day history of worsening nausea, vomiting, and periumbilical abdominal pain. She is a known hernia of the abdominal wall just to the left of the midline. She states that over the past few months, this seems to be getting bigger. She has had hernia repair of a lower midline incision that extends up to the umbilicus in the past. She has been having some loose stools.  Past Medical History  Diagnosis Date  . Grave's disease   . Anxiety   . GERD (gastroesophageal reflux disease)   . Hyperlipidemia   . Herpes   . Polymyalgia rheumatica   . Fibromyalgia   . Palpitation   . Diabetes mellitus without complication     Past Surgical History  Procedure Date  . Appendectomy   . Abdominal hysterectomy   . Hernia repair   . Carpal tunnel release   . Total abdominal hysterectomy w/ bilateral salpingoophorectomy     No family history on file. Social History:  reports that she has never smoked. She does not have any smokeless tobacco history on file. She reports that she does not drink alcohol or use illicit drugs.  Allergies:  Allergies  Allergen Reactions  . Iohexol Shortness Of Breath    Pt stopped breathing at [facility]. Pt refuses IV dye  . Codeine Itching  . Morphine And Related Itching  . Nitrofuran Derivatives Other (See Comments)    Unknown  . Oxycodone-Acetaminophen Itching    Medications Prior to Admission  Medication Sig Dispense Refill  . acyclovir (ZOVIRAX) 200 MG capsule Take 400 mg by mouth 2 (two) times daily.        Marland Kitchen artificial tears (LACRILUBE) OINT ophthalmic ointment Apply 1 application to eye at bedtime.      Marland Kitchen aspirin EC 81 MG tablet Take 81 mg by mouth daily.        . Calcium Carbonate-Vitamin D (CALCIUM 600+D HIGH POTENCY) 600-400 MG-UNIT per tablet Take 1 tablet by mouth daily.        .  cholecalciferol (VITAMIN D) 400 UNITS TABS Take 400 Units by mouth daily.      . clotrimazole (LOTRIMIN) 1 % cream Apply 1 application topically 2 (two) times daily.      . cycloSPORINE (RESTASIS) 0.05 % ophthalmic emulsion Place 1 drop into both eyes daily.      . ferrous gluconate (FERGON) 324 MG tablet Take 324 mg by mouth at bedtime.        . gabapentin (NEURONTIN) 100 MG capsule Take 200 mg by mouth at bedtime.        Marland Kitchen HYDROcodone-acetaminophen (NORCO) 10-325 MG per tablet Take 1-2 tablets by mouth 2 (two) times daily as needed. pain       . hydroxychloroquine (PLAQUENIL) 200 MG tablet Take 400 mg by mouth daily.        . hydroxypropyl methylcellulose (ISOPTO TEARS) 2.5 % ophthalmic solution Place 1 drop into both eyes daily as needed. Dry eyes       . levothyroxine (SYNTHROID, LEVOTHROID) 112 MCG tablet Take 112 mcg by mouth daily.        . metoprolol tartrate (LOPRESSOR) 25 MG tablet Take 25 mg by mouth 2 (two) times daily.        . Multiple Vitamins-Minerals (MULTIVITAMINS THER. W/MINERALS) TABS Take 1 tablet by mouth daily.        Marland Kitchen  nortriptyline (PAMELOR) 25 MG capsule Take 25 mg by mouth at bedtime.        Marland Kitchen omeprazole (PRILOSEC) 20 MG capsule Take 40 mg by mouth 2 (two) times daily.        . simvastatin (ZOCOR) 80 MG tablet Take 40 mg by mouth at bedtime.          Results for orders placed during the hospital encounter of 03/16/12 (from the past 48 hour(s))  CBC WITH DIFFERENTIAL     Status: Abnormal   Collection Time   03/16/12  1:20 PM      Component Value Range Comment   WBC 13.4 (*) 4.0 - 10.5 K/uL    RBC 4.27  3.87 - 5.11 MIL/uL    Hemoglobin 13.6  12.0 - 15.0 g/dL    HCT 29.5  28.4 - 13.2 %    MCV 96.0  78.0 - 100.0 fL    MCH 31.9  26.0 - 34.0 pg    MCHC 33.2  30.0 - 36.0 g/dL    RDW 44.0  10.2 - 72.5 %    Platelets 268  150 - 400 K/uL    Neutrophils Relative 74  43 - 77 %    Neutro Abs 9.9 (*) 1.7 - 7.7 K/uL    Lymphocytes Relative 16  12 - 46 %    Lymphs Abs 2.2   0.7 - 4.0 K/uL    Monocytes Relative 9  3 - 12 %    Monocytes Absolute 1.2 (*) 0.1 - 1.0 K/uL    Eosinophils Relative 0  0 - 5 %    Eosinophils Absolute 0.1  0.0 - 0.7 K/uL    Basophils Relative 0  0 - 1 %    Basophils Absolute 0.0  0.0 - 0.1 K/uL   COMPREHENSIVE METABOLIC PANEL     Status: Abnormal   Collection Time   03/16/12  1:20 PM      Component Value Range Comment   Sodium 138  135 - 145 mEq/L    Potassium 3.5  3.5 - 5.1 mEq/L    Chloride 97  96 - 112 mEq/L    CO2 30  19 - 32 mEq/L    Glucose, Bld 98  70 - 99 mg/dL    BUN 26 (*) 6 - 23 mg/dL    Creatinine, Ser 3.66 (*) 0.50 - 1.10 mg/dL    Calcium 8.8  8.4 - 44.0 mg/dL    Total Protein 6.8  6.0 - 8.3 g/dL    Albumin 3.7  3.5 - 5.2 g/dL    AST 19  0 - 37 U/L    ALT 13  0 - 35 U/L    Alkaline Phosphatase 88  39 - 117 U/L    Total Bilirubin 0.4  0.3 - 1.2 mg/dL    GFR calc non Af Amer 42 (*) >90 mL/min    GFR calc Af Amer 48 (*) >90 mL/min   LIPASE, BLOOD     Status: Normal   Collection Time   03/16/12  1:20 PM      Component Value Range Comment   Lipase 19  11 - 59 U/L   GLUCOSE, CAPILLARY     Status: Normal   Collection Time   03/16/12 10:13 PM      Component Value Range Comment   Glucose-Capillary 91  70 - 99 mg/dL    Comment 1 Notify RN      Comment 2 Documented in Chart  GLUCOSE, CAPILLARY     Status: Normal   Collection Time   03/17/12  7:39 AM      Component Value Range Comment   Glucose-Capillary 89  70 - 99 mg/dL    Comment 1 Documented in Chart      Comment 2 Notify RN      Ct Abdomen Pelvis Wo Contrast  03/16/2012  *RADIOLOGY REPORT*  Clinical Data: Abdominal pain and nausea.  IV contrast allergy.  CT ABDOMEN AND PELVIS WITHOUT CONTRAST  Technique:  Multidetector CT imaging of the abdomen and pelvis was performed following the standard protocol without intravenous contrast.  Comparison: Multiple exams, including 03/16/2012 radiographs, and prior CT scan of 12/29/2010  Findings: Small hiatal hernia noted.  A  nasogastric tube into the stomach body.  There is contrast medium in the hiatal hernia.  The visualized portion of the liver, spleen, pancreas, and adrenal glands appear unremarkable in noncontrast CT appearance.  Gallbladder not visualized, likely surgically absent.  Aortoiliac atherosclerotic calcification noted.  Perirenal stranding appears chronic and bilateral; no calculi observed.  No pathologic retroperitoneal or porta hepatis adenopathy is identified.  No pathologic pelvic adenopathy is identified.  Left paramidline ventral hernia contains a loop of small bowel and represents the transition point from the dilated to nondilated small bowel, with the bowel just proximal to this measuring up to 4 cm in diameter.  No extraluminal gas.  A small amount of contrast medium does extend distal to this area of obstruction. A small amount of ascites is present along the dilated loops of bowel, as shown on image 47 of series 4.  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  Sigmoid diverticulosis noted.  Air-fluid levels are present distally in the colon and in the rectosigmoid region.  Urinary bladder unremarkable.  Uterus absent.  No adnexal mass.  IMPRESSION:  1.  Small bowel obstruction due to a left paramedian ventral herniation of a loop of small bowel.  No extraluminal gas or abscess identified, although there is a small amount of ascites along the dilated loops of bowel. 2.  Sigmoid diverticulosis. 3.  Air-fluid levels are present in the distal colon, as can be encountered in the setting of diarrheal process. 4.  Small hiatal hernia. 5.  Atherosclerosis.   Original Report Authenticated By: Gaylyn Rong, M.D.    Dg Abd Acute W/chest  03/16/2012  *RADIOLOGY REPORT*  Clinical Data: Emesis.  Abdominal pain.  Cough.  ACUTE ABDOMEN SERIES (ABDOMEN 2 VIEW & CHEST 1 VIEW)  Comparison: 06/19/2011  Findings: There are dilated fluid air filled loops of small intestine in the central abdomen suggesting partial small  bowel obstruction.  There is a small amount of gas in the colon.  No free air.  No worrisome calcifications or acute bony findings.  One-view chest shows normal heart and mediastinal shadows.  Lungs are clear.  No free air.  IMPRESSION: Dilated fluid and air filled loops of small bowel the central abdomen suggesting partial small bowel obstruction.   Original Report Authenticated By: Paulina Fusi, M.D.     Review of Systems  Constitutional: Positive for malaise/fatigue.  Eyes: Positive for pain.  Cardiovascular: Negative.   Gastrointestinal: Positive for nausea, vomiting and abdominal pain.  Genitourinary: Negative.   Musculoskeletal: Positive for myalgias.  Skin: Negative.   Neurological: Positive for headaches.  Endo/Heme/Allergies: Negative.     Blood pressure 135/53, pulse 85, temperature 97.8 F (36.6 C), temperature source Oral, resp. rate 18, height 5\' 3"  (1.6 m), weight 76.5 kg (  168 lb 10.4 oz), SpO2 97.00%. Physical Exam  Constitutional: She is oriented to person, place, and time. She appears well-developed and well-nourished.  HENT:  Head: Normocephalic and atraumatic.  Neck: Normal range of motion. Neck supple.  Cardiovascular: Normal rate, regular rhythm and normal heart sounds.   Respiratory: Effort normal and breath sounds normal.  GI: Soft. Bowel sounds are normal. She exhibits distension. There is tenderness.       Reducible hernia just to the left of the umbilicus. Bowel sounds appreciated. No rigidity noted.  Neurological: She is alert and oriented to person, place, and time.  Skin: Skin is warm and dry.     Assessment/Plan Impression: Small bowel obstruction secondary to recurrent incisional hernia. Plan: We'll admit to the hospital for NG tube decompression and IV hydration. She will subsequently need to undergo a laparoscopic incisional hernia repair. The treatment plan has been discussed with the patient, who agrees.  Tyhir Schwan A 03/17/2012, 8:56 AM

## 2012-03-18 LAB — CBC
HCT: 39 % (ref 36.0–46.0)
Hemoglobin: 12.8 g/dL (ref 12.0–15.0)
MCH: 32.1 pg (ref 26.0–34.0)
MCV: 97.7 fL (ref 78.0–100.0)
RBC: 3.99 MIL/uL (ref 3.87–5.11)

## 2012-03-18 LAB — BASIC METABOLIC PANEL
BUN: 19 mg/dL (ref 6–23)
CO2: 36 mEq/L — ABNORMAL HIGH (ref 19–32)
Calcium: 9.2 mg/dL (ref 8.4–10.5)
Chloride: 96 mEq/L (ref 96–112)
Creatinine, Ser: 1.13 mg/dL — ABNORMAL HIGH (ref 0.50–1.10)

## 2012-03-18 LAB — SURGICAL PCR SCREEN
MRSA, PCR: NEGATIVE
Staphylococcus aureus: POSITIVE — AB

## 2012-03-18 LAB — GLUCOSE, CAPILLARY

## 2012-03-18 MED ORDER — CHLORHEXIDINE GLUCONATE 4 % EX LIQD: 1.0000 "application " | Freq: Once | CUTANEOUS | Status: DC

## 2012-03-18 MED ORDER — MENTHOL 3 MG MT LOZG
1.0000 | LOZENGE | OROMUCOSAL | Status: DC | PRN
  Administered 2012-03-18: 3 mg via ORAL
  Filled 2012-03-18 (×2): qty 9

## 2012-03-18 MED ORDER — POTASSIUM CHLORIDE 10 MEQ/100ML IV SOLN
10.0000 meq | INTRAVENOUS | Status: AC
  Administered 2012-03-18 – 2012-03-19 (×5): 10 meq via INTRAVENOUS
  Filled 2012-03-18: qty 100

## 2012-03-18 MED ORDER — POTASSIUM CHLORIDE 10 MEQ/100ML IV SOLN
INTRAVENOUS | Status: AC
  Administered 2012-03-19: 10 meq via INTRAVENOUS
  Filled 2012-03-18: qty 100

## 2012-03-18 MED ORDER — HYDROMORPHONE HCL PF 1 MG/ML IJ SOLN
1.0000 mg | Freq: Once | INTRAMUSCULAR | Status: AC
  Administered 2012-03-18: 1 mg via INTRAMUSCULAR
  Filled 2012-03-18: qty 1

## 2012-03-18 MED ORDER — CEFAZOLIN SODIUM-DEXTROSE 2-3 GM-% IV SOLR: 2.0000 g | INTRAVENOUS | Status: DC

## 2012-03-18 MED ORDER — CHLORHEXIDINE GLUCONATE CLOTH 2 % EX PADS
6.0000 | MEDICATED_PAD | Freq: Every day | CUTANEOUS | Status: AC
  Administered 2012-03-19 – 2012-03-22 (×3): 6 via TOPICAL

## 2012-03-18 MED ORDER — LEVOTHYROXINE SODIUM 100 MCG IV SOLR
INTRAVENOUS | Status: AC
  Filled 2012-03-18: qty 5

## 2012-03-18 MED ORDER — POTASSIUM CHLORIDE 10 MEQ/100ML IV SOLN
INTRAVENOUS | Status: AC
  Administered 2012-03-18: 10 meq via INTRAVENOUS
  Filled 2012-03-18: qty 100

## 2012-03-18 MED ORDER — MUPIROCIN 2 % EX OINT
1.0000 "application " | TOPICAL_OINTMENT | Freq: Two times a day (BID) | CUTANEOUS | Status: AC
  Administered 2012-03-18 – 2012-03-23 (×9): 1 via NASAL
  Filled 2012-03-18 (×3): qty 22

## 2012-03-18 MED ORDER — CHLORHEXIDINE GLUCONATE 0.12 % MT SOLN
15.0000 mL | Freq: Two times a day (BID) | OROMUCOSAL | Status: DC
Start: 2012-03-18 — End: 2012-03-20
  Administered 2012-03-18 – 2012-03-20 (×4): 15 mL via OROMUCOSAL
  Filled 2012-03-18 (×4): qty 15

## 2012-03-18 NOTE — Progress Notes (Signed)
Subjective: No flatus or bowel movement. 2 L of fluid noted in the NG tube.  Objective: Vital signs in last 24 hours: Temp:  [97.8 F (36.6 C)-98.6 F (37 C)] 97.8 F (36.6 C) (01/05 0617) Pulse Rate:  [79-88] 79  (01/05 0617) Resp:  [18] 18  (01/05 0617) BP: (126-149)/(57-77) 146/77 mmHg (01/05 0617) SpO2:  [92 %-98 %] 94 % (01/05 0617) Weight:  [78 kg (171 lb 15.3 oz)] 78 kg (171 lb 15.3 oz) (01/05 0500)    Intake/Output from previous day: 01/04 0701 - 01/05 0700 In: 360 [P.O.:360] Out: 2300 [Urine:300; Emesis/NG output:2000] Intake/Output this shift: Total I/O In: -  Out: 100 [Urine:100]  General appearance: alert, cooperative and no distress Resp: clear to auscultation bilaterally Cardio: regular rate and rhythm, S1, S2 normal, no murmur, click, rub or gallop GI: Soft. Hernia is still easily reducible. Occasional bowel sounds appreciated. No rigidity noted.  Lab Results:   Basename 03/18/12 0524 03/16/12 1320  WBC 8.6 13.4*  HGB 12.8 13.6  HCT 39.0 41.0  PLT 257 268   BMET  Basename 03/18/12 0524 03/16/12 1320  NA 140 138  K 2.9* 3.5  CL 96 97  CO2 36* 30  GLUCOSE 119* 98  BUN 19 26*  CREATININE 1.13* 1.25*  CALCIUM 9.2 8.8   PT/INR No results found for this basename: LABPROT:2,INR:2 in the last 72 hours  Studies/Results: Ct Abdomen Pelvis Wo Contrast  03/16/2012  *RADIOLOGY REPORT*  Clinical Data: Abdominal pain and nausea.  IV contrast allergy.  CT ABDOMEN AND PELVIS WITHOUT CONTRAST  Technique:  Multidetector CT imaging of the abdomen and pelvis was performed following the standard protocol without intravenous contrast.  Comparison: Multiple exams, including 03/16/2012 radiographs, and prior CT scan of 12/29/2010  Findings: Small hiatal hernia noted.  A nasogastric tube into the stomach body.  There is contrast medium in the hiatal hernia.  The visualized portion of the liver, spleen, pancreas, and adrenal glands appear unremarkable in noncontrast CT  appearance.  Gallbladder not visualized, likely surgically absent.  Aortoiliac atherosclerotic calcification noted.  Perirenal stranding appears chronic and bilateral; no calculi observed.  No pathologic retroperitoneal or porta hepatis adenopathy is identified.  No pathologic pelvic adenopathy is identified.  Left paramidline ventral hernia contains a loop of small bowel and represents the transition point from the dilated to nondilated small bowel, with the bowel just proximal to this measuring up to 4 cm in diameter.  No extraluminal gas.  A small amount of contrast medium does extend distal to this area of obstruction. A small amount of ascites is present along the dilated loops of bowel, as shown on image 47 of series 4.  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  Sigmoid diverticulosis noted.  Air-fluid levels are present distally in the colon and in the rectosigmoid region.  Urinary bladder unremarkable.  Uterus absent.  No adnexal mass.  IMPRESSION:  1.  Small bowel obstruction due to a left paramedian ventral herniation of a loop of small bowel.  No extraluminal gas or abscess identified, although there is a small amount of ascites along the dilated loops of bowel. 2.  Sigmoid diverticulosis. 3.  Air-fluid levels are present in the distal colon, as can be encountered in the setting of diarrheal process. 4.  Small hiatal hernia. 5.  Atherosclerosis.   Original Report Authenticated By: Gaylyn Rong, M.D.    Dg Abd Acute W/chest  03/16/2012  *RADIOLOGY REPORT*  Clinical Data: Emesis.  Abdominal pain.  Cough.  ACUTE ABDOMEN SERIES (ABDOMEN 2 VIEW & CHEST 1 VIEW)  Comparison: 06/19/2011  Findings: There are dilated fluid air filled loops of small intestine in the central abdomen suggesting partial small bowel obstruction.  There is a small amount of gas in the colon.  No free air.  No worrisome calcifications or acute bony findings.  One-view chest shows normal heart and mediastinal shadows.  Lungs are  clear.  No free air.  IMPRESSION: Dilated fluid and air filled loops of small bowel the central abdomen suggesting partial small bowel obstruction.   Original Report Authenticated By: Paulina Fusi, M.D.     Anti-infectives: Anti-infectives    None      Assessment/Plan: s/p Procedure(s): LAPAROSCOPIC INCISIONAL HERNIA Impression: Small bowel obstruction secondary to recurrent incisional hernia, hypokalemia Plan: The patient will be taken to the operating room tomorrow for a laparoscopic recurrent incisional herniorrhaphy with mesh. The risks and benefits of the procedure including bleeding, infection, recurrence of the hernia, bowel injury, and the possibility of an open procedure were fully explained to the patient, who gave informed consent. Potassium will be supplemented.  LOS: 2 days    Keliah Harned A 03/18/2012

## 2012-03-19 LAB — BASIC METABOLIC PANEL
Calcium: 9.1 mg/dL (ref 8.4–10.5)
GFR calc Af Amer: 58 mL/min — ABNORMAL LOW (ref >90)
GFR calc non Af Amer: 50 mL/min — ABNORMAL LOW (ref >90)
Potassium: 3.2 mEq/L — ABNORMAL LOW (ref 3.5–5.1)
Sodium: 142 mEq/L (ref 135–145)

## 2012-03-19 MED ORDER — LORAZEPAM 2 MG/ML IJ SOLN
1.0000 mg | INTRAMUSCULAR | Status: DC | PRN
  Administered 2012-03-20 – 2012-03-24 (×6): 1 mg via INTRAVENOUS
  Filled 2012-03-19 (×7): qty 1

## 2012-03-19 MED ORDER — DIPHENHYDRAMINE HCL 50 MG/ML IJ SOLN
12.5000 mg | Freq: Four times a day (QID) | INTRAMUSCULAR | Status: DC | PRN
  Administered 2012-03-19: 12.5 mg via INTRAVENOUS
  Filled 2012-03-19: qty 1

## 2012-03-19 MED ORDER — DIPHENHYDRAMINE HCL 50 MG/ML IJ SOLN
25.0000 mg | Freq: Four times a day (QID) | INTRAMUSCULAR | Status: DC | PRN
  Administered 2012-03-19: 12.5 mg via INTRAVENOUS
  Administered 2012-03-20 (×2): 25 mg via INTRAVENOUS
  Filled 2012-03-19 (×3): qty 1

## 2012-03-19 MED ORDER — POTASSIUM CHLORIDE 10 MEQ/100ML IV SOLN
10.0000 meq | INTRAVENOUS | Status: AC
  Administered 2012-03-19 (×4): 10 meq via INTRAVENOUS
  Filled 2012-03-19 (×4): qty 100

## 2012-03-19 MED ORDER — KCL IN DEXTROSE-NACL 40-5-0.45 MEQ/L-%-% IV SOLN
INTRAVENOUS | Status: DC
  Administered 2012-03-19 (×2): via INTRAVENOUS

## 2012-03-19 MED ORDER — POTASSIUM CHLORIDE 10 MEQ/100ML IV SOLN
10.0000 meq | INTRAVENOUS | Status: DC
  Administered 2012-03-19: 10 meq via INTRAVENOUS
  Filled 2012-03-19: qty 100

## 2012-03-19 NOTE — Care Management Note (Signed)
    Page 1 of 1   03/26/2012     9:41:11 AM   CARE MANAGEMENT NOTE 03/26/2012  Patient:  Christina Fry, Christina Fry   Account Number:  [id number]  Date Initiated:  03/19/2012  Documentation initiated by:  Sharrie Rothman  Subjective/Objective Assessment:   Pt admitted from home with SBO. Pt lives alone and has a daughter who lives across the road from the pt and is very active in her care. Pt has a cane and walker for home use. [facility] [place] but [facility].     Action/Plan:   CM faxed refusal for tranfer form to [place] to [place]. Will continue to follow for any HH needs.   Anticipated DC Date:  03/23/2012   Anticipated DC Plan:  HOME/SELF CARE      DC Planning Services  CM consult  [facility] referrals / transfers      Choice offered to / List presented to:             Status of service:  Completed, signed off Medicare Important Message given?  YES (If response is "NO", the following Medicare IM given date fields will be blank) Date Medicare IM given:  03/26/2012 Date Additional Medicare IM given:    Discharge Disposition:  HOME/SELF CARE  Per UR Regulation:    If discussed at Long Length of Stay Meetings, dates discussed:   03/22/2012    Comments:  03/26/12 0940 Arlyss Queen, RN BSN CM Pt discharged home today. No CM or HH needs noted.  03/19/12 1545 Arlyss Queen, RN BSN CM

## 2012-03-19 NOTE — Progress Notes (Signed)
  Subjective: No complaints.  Objective: Vital signs in last 24 hours: Temp:  [97.8 F (36.6 C)-98.5 F (36.9 C)] 98.2 F (36.8 C) (01/06 0556) Pulse Rate:  [79-87] 82  (01/06 0556) Resp:  [20] 20  (01/06 0556) BP: (138-144)/(68-83) 139/83 mmHg (01/06 0556) SpO2:  [92 %-99 %] 96 % (01/06 0556) Last BM Date: 03/16/12  Intake/Output from previous day: 01/05 0701 - 01/06 0700 In: 180 [P.O.:180] Out: 1200 [Urine:200; Emesis/NG output:1000] Intake/Output this shift: Total I/O In: 1000 [NG/GT:1000] Out: 200 [Urine:200]  General appearance: alert and cooperative Resp: clear to auscultation bilaterally Cardio: regular rate and rhythm, S1, S2 normal, no murmur, click, rub or gallop GI: Soft, nontender, nondistended. Hernia is still present, easily reducible.  Lab Results:   Basename 03/18/12 0524 03/16/12 1320  WBC 8.6 13.4*  HGB 12.8 13.6  HCT 39.0 41.0  PLT 257 268   BMET  Basename 03/19/12 0351 03/18/12 0524  NA 142 140  K 3.2* 2.9*  CL 97 96  CO2 38* 36*  GLUCOSE 126* 119*  BUN 13 19  CREATININE 1.08 1.13*  CALCIUM 9.1 9.2   PT/INR No results found for this basename: LABPROT:2,INR:2 in the last 72 hours  Studies/Results: No results found.  Anti-infectives: Anti-infectives     Start     Dose/Rate Route Frequency Ordered Stop   03/18/12 1030   ceFAZolin (ANCEF) IVPB 2 g/50 mL premix  Status:  Discontinued        2 g 100 mL/hr over 30 Minutes Intravenous On call to O.R. 03/18/12 1018 03/18/12 1032          Assessment/Plan: Impression: Small bowel traction secondary to incisional hernia. Still with hypokalemia. Plan: Will supplement potassium and delay surgery until tomorrow.  LOS: 3 days    Tate Jerkins A 03/19/2012

## 2012-03-19 NOTE — Progress Notes (Signed)
UR chart review completed.  

## 2012-03-20 ENCOUNTER — Encounter (HOSPITAL_COMMUNITY): Payer: Self-pay | Admitting: *Deleted

## 2012-03-20 ENCOUNTER — Encounter (HOSPITAL_COMMUNITY): Payer: Self-pay | Admitting: Anesthesiology

## 2012-03-20 ENCOUNTER — Encounter (HOSPITAL_COMMUNITY)
Admission: EM | Disposition: A | Discharge status: Home / Self Care | Payer: Self-pay | Source: Home / Self Care | Attending: General Surgery

## 2012-03-20 ENCOUNTER — Inpatient Hospital Stay (HOSPITAL_COMMUNITY): Payer: Medicare Other | Admitting: Anesthesiology

## 2012-03-20 HISTORY — PX: INSERTION OF MESH: SHX5868

## 2012-03-20 HISTORY — PX: INCISIONAL HERNIA REPAIR: SHX193

## 2012-03-20 LAB — BASIC METABOLIC PANEL
Chloride: 98 mEq/L (ref 96–112)
GFR calc Af Amer: 49 mL/min — ABNORMAL LOW (ref >90)
GFR calc non Af Amer: 42 mL/min — ABNORMAL LOW (ref >90)
Potassium: 4 mEq/L (ref 3.5–5.1)
Sodium: 140 mEq/L (ref 135–145)

## 2012-03-20 SURGERY — REPAIR, HERNIA, INCISIONAL, LAPAROSCOPIC
Anesthesia: General | Site: Abdomen | Wound class: Clean

## 2012-03-20 MED ORDER — MIDAZOLAM HCL 2 MG/2ML IJ SOLN
1.0000 mg | INTRAMUSCULAR | Status: DC | PRN
  Administered 2012-03-20: 2 mg via INTRAVENOUS

## 2012-03-20 MED ORDER — DEXAMETHASONE SODIUM PHOSPHATE 10 MG/ML IJ SOLN
INTRAMUSCULAR | Status: DC | PRN
  Administered 2012-03-20: 4 mg via INTRAVENOUS

## 2012-03-20 MED ORDER — FENTANYL CITRATE 0.05 MG/ML IJ SOLN
INTRAMUSCULAR | Status: AC
  Filled 2012-03-20: qty 5

## 2012-03-20 MED ORDER — LEVOTHYROXINE SODIUM 112 MCG PO TABS
112.0000 ug | ORAL_TABLET | Freq: Every day | ORAL | Status: DC
  Administered 2012-03-21 – 2012-03-26 (×6): 112 ug via ORAL
  Filled 2012-03-20 (×8): qty 1

## 2012-03-20 MED ORDER — LIDOCAINE HCL (CARDIAC) 20 MG/ML IV SOLN
INTRAVENOUS | Status: DC | PRN
  Administered 2012-03-20: 40 mg via INTRAVENOUS

## 2012-03-20 MED ORDER — ONDANSETRON HCL 4 MG/2ML IJ SOLN
4.0000 mg | Freq: Once | INTRAMUSCULAR | Status: AC
  Administered 2012-03-20: 4 mg via INTRAVENOUS

## 2012-03-20 MED ORDER — DEXAMETHASONE SODIUM PHOSPHATE 4 MG/ML IJ SOLN
INTRAMUSCULAR | Status: AC
  Filled 2012-03-20: qty 1

## 2012-03-20 MED ORDER — GLYCOPYRROLATE 0.2 MG/ML IJ SOLN
INTRAMUSCULAR | Status: DC | PRN
  Administered 2012-03-20: .6 mg via INTRAVENOUS
  Administered 2012-03-20: .2 mg via INTRAVENOUS

## 2012-03-20 MED ORDER — PHENYLEPHRINE HCL 10 MG/ML IJ SOLN
INTRAMUSCULAR | Status: AC
  Filled 2012-03-20: qty 1

## 2012-03-20 MED ORDER — LACTATED RINGERS IV SOLN
INTRAVENOUS | Status: DC
  Administered 2012-03-20 (×2): via INTRAVENOUS

## 2012-03-20 MED ORDER — ENOXAPARIN SODIUM 40 MG/0.4ML ~~LOC~~ SOLN
40.0000 mg | SUBCUTANEOUS | Status: DC
  Administered 2012-03-21: 40 mg via SUBCUTANEOUS
  Filled 2012-03-20: qty 0.4

## 2012-03-20 MED ORDER — BUPIVACAINE HCL (PF) 0.5 % IJ SOLN
INTRAMUSCULAR | Status: DC | PRN
  Administered 2012-03-20: 10 mL

## 2012-03-20 MED ORDER — ONDANSETRON HCL 4 MG/2ML IJ SOLN
4.0000 mg | Freq: Four times a day (QID) | INTRAMUSCULAR | Status: DC | PRN
  Administered 2012-03-20 – 2012-03-23 (×3): 4 mg via INTRAVENOUS
  Filled 2012-03-20 (×4): qty 2

## 2012-03-20 MED ORDER — PROPOFOL 10 MG/ML IV BOLUS
INTRAVENOUS | Status: DC | PRN
  Administered 2012-03-20: 140 mg via INTRAVENOUS

## 2012-03-20 MED ORDER — ONDANSETRON HCL 4 MG/2ML IJ SOLN
INTRAMUSCULAR | Status: AC
  Filled 2012-03-20: qty 2

## 2012-03-20 MED ORDER — ROCURONIUM BROMIDE 50 MG/5ML IV SOLN
INTRAVENOUS | Status: AC
  Filled 2012-03-20: qty 1

## 2012-03-20 MED ORDER — LIDOCAINE HCL (PF) 1 % IJ SOLN
INTRAMUSCULAR | Status: AC
  Filled 2012-03-20: qty 5

## 2012-03-20 MED ORDER — SUCCINYLCHOLINE CHLORIDE 20 MG/ML IJ SOLN
INTRAMUSCULAR | Status: AC
  Filled 2012-03-20: qty 1

## 2012-03-20 MED ORDER — ONDANSETRON HCL 4 MG/2ML IJ SOLN: 4.0000 mg | Freq: Once | INTRAMUSCULAR | Status: DC | PRN

## 2012-03-20 MED ORDER — METOPROLOL TARTRATE 25 MG PO TABS
25.0000 mg | ORAL_TABLET | Freq: Two times a day (BID) | ORAL | Status: DC
  Administered 2012-03-20 – 2012-03-26 (×12): 25 mg via ORAL
  Filled 2012-03-20 (×12): qty 1

## 2012-03-20 MED ORDER — MIDAZOLAM HCL 2 MG/2ML IJ SOLN
INTRAMUSCULAR | Status: AC
  Filled 2012-03-20: qty 2

## 2012-03-20 MED ORDER — NORTRIPTYLINE HCL 25 MG PO CAPS
25.0000 mg | ORAL_CAPSULE | Freq: Every day | ORAL | Status: DC
  Administered 2012-03-20 – 2012-03-25 (×6): 25 mg via ORAL
  Filled 2012-03-20 (×6): qty 1

## 2012-03-20 MED ORDER — FENTANYL CITRATE 0.05 MG/ML IJ SOLN
INTRAMUSCULAR | Status: DC | PRN
  Administered 2012-03-20 (×7): 50 ug via INTRAVENOUS

## 2012-03-20 MED ORDER — METOPROLOL TARTRATE 1 MG/ML IV SOLN
INTRAVENOUS | Status: DC | PRN
  Administered 2012-03-20: 2 mg via INTRAVENOUS

## 2012-03-20 MED ORDER — CEFAZOLIN SODIUM-DEXTROSE 2-3 GM-% IV SOLR
INTRAVENOUS | Status: AC
  Filled 2012-03-20: qty 50

## 2012-03-20 MED ORDER — POVIDONE-IODINE 10 % EX OINT
TOPICAL_OINTMENT | CUTANEOUS | Status: DC | PRN
  Administered 2012-03-20: 2 via TOPICAL

## 2012-03-20 MED ORDER — ENOXAPARIN SODIUM 40 MG/0.4ML ~~LOC~~ SOLN
SUBCUTANEOUS | Status: AC
  Filled 2012-03-20: qty 0.4

## 2012-03-20 MED ORDER — DEXAMETHASONE SODIUM PHOSPHATE 4 MG/ML IJ SOLN
4.0000 mg | Freq: Once | INTRAMUSCULAR | Status: AC
  Administered 2012-03-20: 4 mg via INTRAVENOUS

## 2012-03-20 MED ORDER — CEFAZOLIN SODIUM-DEXTROSE 2-3 GM-% IV SOLR
2.0000 g | INTRAVENOUS | Status: AC
  Administered 2012-03-20: 2 g via INTRAVENOUS

## 2012-03-20 MED ORDER — FENTANYL CITRATE 0.05 MG/ML IJ SOLN: 25.0000 ug | INTRAMUSCULAR | Status: DC | PRN

## 2012-03-20 MED ORDER — LACTATED RINGERS IV SOLN
INTRAVENOUS | Status: DC
  Administered 2012-03-20 – 2012-03-21 (×2): via INTRAVENOUS

## 2012-03-20 MED ORDER — PHENYLEPHRINE HCL 10 MG/ML IJ SOLN
INTRAMUSCULAR | Status: DC | PRN
  Administered 2012-03-20: 50 ug via INTRAVENOUS

## 2012-03-20 MED ORDER — ACETAMINOPHEN 10 MG/ML IV SOLN
1000.0000 mg | Freq: Four times a day (QID) | INTRAVENOUS | Status: AC
  Administered 2012-03-20 – 2012-03-21 (×4): 1000 mg via INTRAVENOUS
  Filled 2012-03-20 (×4): qty 100

## 2012-03-20 MED ORDER — 0.9 % SODIUM CHLORIDE (POUR BTL) OPTIME
TOPICAL | Status: DC | PRN
  Administered 2012-03-20: 1000 mL

## 2012-03-20 MED ORDER — PROPOFOL 10 MG/ML IV EMUL
INTRAVENOUS | Status: AC
  Filled 2012-03-20: qty 20

## 2012-03-20 MED ORDER — NEOSTIGMINE METHYLSULFATE 1 MG/ML IJ SOLN
INTRAMUSCULAR | Status: DC | PRN
  Administered 2012-03-20: 3.5 mg via INTRAVENOUS

## 2012-03-20 MED ORDER — ROCURONIUM BROMIDE 100 MG/10ML IV SOLN
INTRAVENOUS | Status: DC | PRN
  Administered 2012-03-20: 10 mg via INTRAVENOUS
  Administered 2012-03-20: 5 mg via INTRAVENOUS
  Administered 2012-03-20: 25 mg via INTRAVENOUS
  Administered 2012-03-20: 10 mg via INTRAVENOUS

## 2012-03-20 MED ORDER — FENTANYL CITRATE 0.05 MG/ML IJ SOLN
50.0000 ug | INTRAMUSCULAR | Status: DC | PRN
  Administered 2012-03-20 – 2012-03-25 (×30): 50 ug via INTRAVENOUS
  Filled 2012-03-20 (×32): qty 2

## 2012-03-20 MED ORDER — GLYCOPYRROLATE 0.2 MG/ML IJ SOLN
INTRAMUSCULAR | Status: AC
  Filled 2012-03-20: qty 4

## 2012-03-20 MED ORDER — BUPIVACAINE HCL (PF) 0.5 % IJ SOLN
INTRAMUSCULAR | Status: AC
  Filled 2012-03-20: qty 30

## 2012-03-20 MED ORDER — ONDANSETRON HCL 4 MG PO TABS
4.0000 mg | ORAL_TABLET | Freq: Four times a day (QID) | ORAL | Status: DC | PRN
  Administered 2012-03-22: 4 mg via ORAL
  Filled 2012-03-20 (×2): qty 1

## 2012-03-20 MED ORDER — POVIDONE-IODINE 10 % EX OINT
TOPICAL_OINTMENT | CUTANEOUS | Status: AC
  Filled 2012-03-20: qty 1

## 2012-03-20 SURGICAL SUPPLY — 49 items
BAG HAMPER (MISCELLANEOUS) ×2 IMPLANT
BINDER ABD UNIV 9 30-45 (GAUZE/BANDAGES/DRESSINGS) ×1 IMPLANT
BINDER ABDOMINAL 9 (GAUZE/BANDAGES/DRESSINGS) ×2
CHLORAPREP W/TINT 26ML (MISCELLANEOUS) ×2 IMPLANT
CLOTH BEACON ORANGE TIMEOUT ST (SAFETY) ×2 IMPLANT
COVER LIGHT HANDLE STERIS (MISCELLANEOUS) ×4 IMPLANT
DECANTER SPIKE VIAL GLASS SM (MISCELLANEOUS) ×2 IMPLANT
DERMABOND ADVANCED (GAUZE/BANDAGES/DRESSINGS)
DERMABOND ADVANCED .7 DNX12 (GAUZE/BANDAGES/DRESSINGS) IMPLANT
DEVICE TROCAR PUNCTURE CLOSURE (ENDOMECHANICALS) ×2 IMPLANT
DRSG TEGADERM 2-3/8X2-3/4 SM (GAUZE/BANDAGES/DRESSINGS) ×4 IMPLANT
DRSG TEGADERM 4X4.75 (GAUZE/BANDAGES/DRESSINGS) ×4 IMPLANT
ELECT REM PT RETURN 9FT ADLT (ELECTROSURGICAL) ×2
ELECTRODE REM PT RTRN 9FT ADLT (ELECTROSURGICAL) ×1 IMPLANT
FILTER SMOKE EVAC LAPAROSHD (FILTER) ×2 IMPLANT
GLOVE BIO SURGEON STRL SZ7.5 (GLOVE) ×2 IMPLANT
GLOVE BIOGEL PI IND STRL 7.0 (GLOVE) ×2 IMPLANT
GLOVE BIOGEL PI INDICATOR 7.0 (GLOVE) ×2
GLOVE ECLIPSE 6.5 STRL STRAW (GLOVE) ×2 IMPLANT
GLOVE SS BIOGEL STRL SZ 6.5 (GLOVE) ×1 IMPLANT
GLOVE SUPERSENSE BIOGEL SZ 6.5 (GLOVE) ×1
GOWN STRL REIN XL XLG (GOWN DISPOSABLE) ×6 IMPLANT
INST SET LAPROSCOPIC AP (KITS) ×2 IMPLANT
IV NS IRRIG 3000ML ARTHROMATIC (IV SOLUTION) IMPLANT
KIT ROOM TURNOVER APOR (KITS) ×2 IMPLANT
LIGASURE 5MM LAPAROSCOPIC (INSTRUMENTS) IMPLANT
LIGASURE LAP ATLAS 10MM 37CM (INSTRUMENTS) ×2 IMPLANT
MANIFOLD NEPTUNE II (INSTRUMENTS) ×2 IMPLANT
MESH PHYSIO OVAL 20X25CM (Mesh General) ×2 IMPLANT
NEEDLE INSUFFLATION 14GA 120MM (NEEDLE) ×2 IMPLANT
NS IRRIG 1000ML POUR BTL (IV SOLUTION) ×2 IMPLANT
PACK LAP CHOLE LZT030E (CUSTOM PROCEDURE TRAY) ×2 IMPLANT
PAD ARMBOARD 7.5X6 YLW CONV (MISCELLANEOUS) ×2 IMPLANT
SET BASIN LINEN APH (SET/KITS/TRAYS/PACK) ×2 IMPLANT
SET TUBE IRRIG SUCTION NO TIP (IRRIGATION / IRRIGATOR) IMPLANT
SLEEVE Z-THREAD 5X100MM (TROCAR) IMPLANT
SPONGE GAUZE 2X2 8PLY STRL LF (GAUZE/BANDAGES/DRESSINGS) ×8 IMPLANT
STAPLER VISISTAT (STAPLE) ×2 IMPLANT
STRIP CLOSURE SKIN 1/2X4 (GAUZE/BANDAGES/DRESSINGS) ×2 IMPLANT
SUT PROLENE 0 CT 1 CR/8 (SUTURE) ×2 IMPLANT
SUT VICRYL 0 UR6 27IN ABS (SUTURE) ×2 IMPLANT
TACKER 5MM HERNIA 3.5CML NAB (ENDOMECHANICALS) ×4 IMPLANT
TOWEL OR 17X26 4PK STRL BLUE (TOWEL DISPOSABLE) ×2 IMPLANT
TRAY FOLEY CATH 14FR (SET/KITS/TRAYS/PACK) ×2 IMPLANT
TROCAR Z-THRD FIOS HNDL 11X100 (TROCAR) ×2 IMPLANT
TROCAR Z-THREAD FIOS 5X100MM (TROCAR) ×2 IMPLANT
TROCAR Z-THREAD SLEEVE 11X100 (TROCAR) ×6 IMPLANT
TUBING HI FLO HEAT INSUFFLATOR (IRRIGATION / IRRIGATOR) ×2 IMPLANT
WARMER LAPAROSCOPE (MISCELLANEOUS) ×2 IMPLANT

## 2012-03-20 NOTE — Anesthesia Postprocedure Evaluation (Signed)
  Anesthesia Post-op Note  Patient: Christina Fry  Procedure(s) Performed: Procedure(s) (LRB) with comments: LAPAROSCOPIC INCISIONAL HERNIA (N/A) INSERTION OF MESH (N/A)  Patient Location: PACU  Anesthesia Type:General  Level of Consciousness: awake, alert  and oriented  Airway and Oxygen Therapy: Patient Spontanous Breathing and Patient connected to face mask oxygen  Post-op Pain: mild  Post-op Assessment: Post-op Vital signs reviewed, Patient's Cardiovascular Status Stable, Respiratory Function Stable, Patent Airway and No signs of Nausea or vomiting  Post-op Vital Signs: Reviewed and stable  Complications: No apparent anesthesia complications

## 2012-03-20 NOTE — Anesthesia Preprocedure Evaluation (Addendum)
Anesthesia Evaluation  Patient identified by MRN, date of birth, ID band Patient awake    Reviewed: Allergy & Precautions, H&P , NPO status , Patient's Chart, lab work & pertinent test results, reviewed documented beta blocker date and time   History of Anesthesia Complications Negative for: history of anesthetic complications  Airway Mallampati: II TM Distance: >3 FB    Comment: Glidescope used with this intubation.  Successful on 2nd attempt by Dr. Jayme Cloud.  Anterior airway. Dental  (+) Teeth Intact   Pulmonary neg pulmonary ROS,  breath sounds clear to auscultation        Cardiovascular hypertension, Pt. on medications Rhythm:Regular Rate:Normal     Neuro/Psych Anxiety    GI/Hepatic GERD-  Medicated and Controlled,  Endo/Other  diabetes (inactive), Type 2Hyperthyroidism (Rx w/ I131)   Renal/GU      Musculoskeletal   Abdominal   Peds  Hematology   Anesthesia Other Findings   Reproductive/Obstetrics                          Anesthesia Physical Anesthesia Plan  ASA: III  Anesthesia Plan: General   Post-op Pain Management:    Induction: Intravenous, Rapid sequence and Cricoid pressure planned  Airway Management Planned: Oral ETT  Additional Equipment:   Intra-op Plan:   Post-operative Plan: Extubation in OR  Informed Consent: I have reviewed the patients History and Physical, chart, labs and discussed the procedure including the risks, benefits and alternatives for the proposed anesthesia with the patient or authorized representative who has indicated his/her understanding and acceptance.     Plan Discussed with:   Anesthesia Plan Comments:         Anesthesia Quick Evaluation

## 2012-03-20 NOTE — Transfer of Care (Signed)
Immediate Anesthesia Transfer of Care Note  Patient: Christina Fry  Procedure(s) Performed: Procedure(s) (LRB) with comments: LAPAROSCOPIC INCISIONAL HERNIA (N/A) INSERTION OF MESH (N/A)  Patient Location: PACU  Anesthesia Type:General  Level of Consciousness: awake  Airway & Oxygen Therapy: Patient Spontanous Breathing and Patient connected to face mask oxygen  Post-op Assessment: Report given to PACU RN  Post vital signs: Reviewed and stable  Complications: No apparent anesthesia complications

## 2012-03-20 NOTE — Op Note (Signed)
Patient:  Christina Fry  DOB:  1940-09-30  MRN:  [id number]   Preop Diagnosis:  Recurrent incisional hernia  Postop Diagnosis:  Same  Procedure:  Laparoscopic recurrent incisional herniorrhaphy with mesh  Surgeon:  Franky Macho, M.D.  Anes:  General endotracheal  Indications:  Patient is a 72 year old white female status post an incisional herniorrhaphy in the past and another facility who presented with an incarcerated incisional hernia with a small bowel obstruction. After being treated with nasogastric tube decompression, the patient now comes the operating room for a recurrent laparoscopic incisional herniorrhaphy with mesh. The risks and benefits of the procedure including bleeding, infection, recurrence, bowel injury, and the possibility of an open procedure were fully explained to the patient, who gave informed consent.  Procedure note:  The patient was placed in the supine position. After induction of general endotracheal anesthesia, the abdomen was prepped and draped using usual sterile technique with Clorpactin. Surgical site confirmation was performed.  A small incision was made in the epigastric region and a Veress needle was introduced into the abdominal cavity without difficulty. Adequate positioning was confirmed by the saline drop test. The abdomen was then insufflated to 16 mm mercury pressure. An 11 mm trocar was introduced in the left upper quadrant under direct visualization without difficulty. An additional 1 mm trocar was placed in the right the quadrant region and left lower quadrant region. A 5 mm trocar was placed the right lower corner region. Small bowel and omentum were noted to be attached to the ventral wall hernia and mesh that had been placed previously. Using the LigaSure and sharp dissection, the bowel and omentum were freed away from the hernia without difficulty. In order to facilitate adequate patch repair of the hernia, a 20 x 25 cm Ethicon physeal mesh was  inserted. It was tacked at 4 quadrants using 0 Prolene interrupted sutures. A pro-tacker was then used circumferentially in a 2 layer fashion to secure the mesh. The circumferential edge of the hernia was covered at least with 2-3 cm of mesh. The bowel was inspected and noted within normal limits. All air was then evacuated from the abdominal cavity prior to removal of the trochars.  All wounds were gave normal saline. All wounds were injected with 0.5% Sensorcaine. The trocar site wounds were closed using staples. Steri-Strips were used for the puncture wounds. Dry sterile dressings were applied.  All tape and needle counts were correct at the end of the procedure. Patient was extubated in the operating room and transferred to PACU in stable condition.  Complications:  None  EBL:  Minimal  Specimen:  None

## 2012-03-20 NOTE — Anesthesia Procedure Notes (Signed)
Procedure Name: Intubation Date/Time: 03/20/2012 9:45 AM Performed by: Glynn Octave E Pre-anesthesia Checklist: Patient identified, Patient being monitored, Timeout performed, Emergency Drugs available and Suction available Patient Re-evaluated:Patient Re-evaluated prior to inductionOxygen Delivery Method: Circle System Utilized Preoxygenation: Pre-oxygenation with 100% oxygen Intubation Type: IV induction, Rapid sequence and Cricoid Pressure applied Ventilation: Mask ventilation without difficulty Grade View: Grade II Tube type: Oral Tube size: 7.0 mm Number of attempts: 3 Airway Equipment and Method: stylet and Video-laryngoscopy Placement Confirmation: ETT inserted through vocal cords under direct vision,  positive ETCO2 and breath sounds checked- equal and bilateral Secured at: 21 cm Tube secured with: Tape Dental Injury: Teeth and Oropharynx as per pre-operative assessment  Difficulty Due To: Difficult Airway- due to anterior larynx and Difficulty was anticipated Comments: CRNA attempted x1 with glidescope, poor visualization.  Hand ventilated and Dr. Jayme Cloud in.  Successfully intubated on 2nd attempt by Dr. Jayme Cloud with glidescope.

## 2012-03-21 LAB — CBC
HCT: 20.8 % — ABNORMAL LOW (ref 36.0–46.0)
Hemoglobin: 6.6 g/dL — CL (ref 12.0–15.0)
MCH: 31.4 pg (ref 26.0–34.0)
MCHC: 31.7 g/dL (ref 30.0–36.0)
RDW: 13 % (ref 11.5–15.5)

## 2012-03-21 LAB — HEMOGLOBIN AND HEMATOCRIT, BLOOD
HCT: 37 % (ref 36.0–46.0)
Hemoglobin: 12.3 g/dL (ref 12.0–15.0)

## 2012-03-21 LAB — BASIC METABOLIC PANEL
CO2: 28 mEq/L (ref 19–32)
Calcium: 7.9 mg/dL — ABNORMAL LOW (ref 8.4–10.5)
Creatinine, Ser: 1.03 mg/dL (ref 0.50–1.10)
GFR calc non Af Amer: 53 mL/min — ABNORMAL LOW (ref >90)
Glucose, Bld: 79 mg/dL (ref 70–99)
Sodium: 136 mEq/L (ref 135–145)

## 2012-03-21 LAB — PHOSPHORUS: Phosphorus: 2.9 mg/dL (ref 2.3–4.6)

## 2012-03-21 LAB — ABO/RH: ABO/RH(D): O POS

## 2012-03-21 MED ORDER — KCL IN DEXTROSE-NACL 20-5-0.45 MEQ/L-%-% IV SOLN
INTRAVENOUS | Status: DC
  Administered 2012-03-21 – 2012-03-25 (×5): via INTRAVENOUS

## 2012-03-21 MED ORDER — MAGNESIUM SULFATE 40 MG/ML IJ SOLN
2.0000 g | Freq: Once | INTRAMUSCULAR | Status: AC
  Administered 2012-03-21: 2 g via INTRAVENOUS
  Filled 2012-03-21: qty 50

## 2012-03-21 MED ORDER — ACETAMINOPHEN 10 MG/ML IV SOLN
1000.0000 mg | Freq: Four times a day (QID) | INTRAVENOUS | Status: AC
  Administered 2012-03-21 – 2012-03-22 (×3): 1000 mg via INTRAVENOUS
  Filled 2012-03-21 (×4): qty 100

## 2012-03-21 NOTE — Anesthesia Postprocedure Evaluation (Signed)
  Anesthesia Post-op Note  Patient: Christina Fry  Procedure(s) Performed: Procedure(s) (LRB) with comments: LAPAROSCOPIC INCISIONAL HERNIA (N/A) INSERTION OF MESH (N/A)  Patient Location: room 317  Anesthesia Type:General  Level of Consciousness: awake, alert , oriented and patient cooperative  Airway and Oxygen Therapy: Patient Spontanous Breathing and Patient connected to nasal cannula oxygen  Post-op Pain: 2 /10, mild  Post-op Assessment: Post-op Vital signs reviewed, Patient's Cardiovascular Status Stable, Respiratory Function Stable, Patent Airway, No signs of Nausea or vomiting, Adequate PO intake and Pain level controlled  Post-op Vital Signs: Reviewed and stable  Complications: No apparent anesthesia complications

## 2012-03-21 NOTE — Progress Notes (Signed)
1 Day Post-Op  Subjective: Feels okay. Not lightheaded. Does have incisional pain.  Objective: Vital signs in last 24 hours: Temp:  [97.6 F (36.4 C)-98.6 F (37 C)] 98.1 F (36.7 C) (01/08 0646) Pulse Rate:  [67-93] 67  (01/08 0646) Resp:  [10-18] 16  (01/07 2143) BP: (129-152)/(46-85) 137/80 mmHg (01/08 0646) SpO2:  [92 %-100 %] 100 % (01/08 0646) Last BM Date: 03/16/12  Intake/Output from previous day: 01/07 0701 - 01/08 0700 In: 3163.8 [P.O.:250; I.V.:2913.8] Out: 1205 [Urine:505; Emesis/NG output:700] Intake/Output this shift:    General appearance: alert, cooperative and no distress Resp: clear to auscultation bilaterally Cardio: regular rate and rhythm, S1, S2 normal, no murmur, click, rub or gallop GI: Soft. Abdomen not distended or tense. Dressings dry and intact.  Lab Results:   [facility] 03/21/12 0504  WBC 7.0  HGB 6.6*  HCT 20.8*  PLT 134*   BMET  Basename 03/21/12 0504 03/20/12 0150  NA 136 140  K 3.9 4.0  CL 100 98  CO2 28 35*  GLUCOSE 79 135*  BUN 10 10  CREATININE 1.03 1.23*  CALCIUM 7.9* 9.0   PT/INR No results found for this basename: LABPROT:2,INR:2 in the last 72 hours  Studies/Results: No results found.  Anti-infectives: Anti-infectives     Start     Dose/Rate Route Frequency Ordered Stop   03/20/12 0749   ceFAZolin (ANCEF) IVPB 2 g/50 mL premix        2 g 100 mL/hr over 30 Minutes Intravenous On call to O.R. 03/20/12 0749 03/20/12 0950   03/18/12 1030   ceFAZolin (ANCEF) IVPB 2 g/50 mL premix  Status:  Discontinued        2 g 100 mL/hr over 30 Minutes Intravenous On call to O.R. 03/18/12 1018 03/18/12 1032          Assessment/Plan: s/p Procedure(s): LAPAROSCOPIC INCISIONAL HERNIA INSERTION OF MESH Impression: Vital signs are stable, though patient did have a drop in her hemoglobin. This is probably secondary to surgery and fluid hydration. We'll give one unit of packed red blood cells. Will hold Lovenox for now. We'll  decrease IV fluids. Patient has hypomagnesemia which will be addressed. This was explained to the patient. Will recheck H/H 1 hour after transfusion finished.  LOS: 5 days    Jamina Macbeth A 03/21/2012

## 2012-03-21 NOTE — Addendum Note (Signed)
Addendum  created 03/21/12 1224 by Othel Hoogendoorn J Lamorris Knoblock, CRNA   Modules edited:Notes Section    

## 2012-03-21 NOTE — Progress Notes (Signed)
CRITICAL VALUE ALERT  Critical value received:  hgb 6.6   Date of notification:01-19-13  Time of notification: 0640  Critical value read back: yes   Nurse who received alert:  b joh nson, rn   MD notified (1st page): jenkins, md  Time of first page:  0700  MD notified (2nd page  Time of second page:  Responding MD:  Lovell Sheehan, md  Time MD responded: [phone]

## 2012-03-22 ENCOUNTER — Encounter (HOSPITAL_COMMUNITY): Payer: Self-pay | Admitting: General Surgery

## 2012-03-22 LAB — BASIC METABOLIC PANEL
CO2: 29 mEq/L (ref 19–32)
Chloride: 104 mEq/L (ref 96–112)
Creatinine, Ser: 1.1 mg/dL (ref 0.50–1.10)
GFR calc Af Amer: 56 mL/min — ABNORMAL LOW (ref >90)
Potassium: 3.6 mEq/L (ref 3.5–5.1)
Sodium: 139 mEq/L (ref 135–145)

## 2012-03-22 LAB — CBC
MCV: 95.1 fL (ref 78.0–100.0)
Platelets: 230 10*3/uL (ref 150–400)
RBC: 3.89 MIL/uL (ref 3.87–5.11)
RDW: 15.7 % — ABNORMAL HIGH (ref 11.5–15.5)
WBC: 9.4 10*3/uL (ref 4.0–10.5)

## 2012-03-22 LAB — MAGNESIUM: Magnesium: 2 mg/dL (ref 1.5–2.5)

## 2012-03-22 LAB — PHOSPHORUS: Phosphorus: 2.6 mg/dL (ref 2.3–4.6)

## 2012-03-22 MED ORDER — HYDROCODONE-ACETAMINOPHEN 5-325 MG PO TABS
1.0000 | ORAL_TABLET | Freq: Four times a day (QID) | ORAL | Status: DC | PRN
  Administered 2012-03-22 – 2012-03-26 (×8): 2 via ORAL
  Filled 2012-03-22: qty 2
  Filled 2012-03-22: qty 1
  Filled 2012-03-22 (×2): qty 2
  Filled 2012-03-22: qty 1
  Filled 2012-03-22 (×5): qty 2

## 2012-03-22 MED ORDER — ENOXAPARIN SODIUM 40 MG/0.4ML ~~LOC~~ SOLN
40.0000 mg | SUBCUTANEOUS | Status: DC
  Administered 2012-03-22 – 2012-03-26 (×5): 40 mg via SUBCUTANEOUS
  Filled 2012-03-22 (×5): qty 0.4

## 2012-03-22 MED ORDER — MAGNESIUM HYDROXIDE 400 MG/5ML PO SUSP
30.0000 mL | Freq: Two times a day (BID) | ORAL | Status: DC
  Administered 2012-03-24 – 2012-03-25 (×3): 30 mL via ORAL
  Filled 2012-03-22 (×3): qty 30

## 2012-03-22 NOTE — Progress Notes (Signed)
Patient encouraged to ambulate in the hall twice during the 12 hour shift today, each time she was approached, she refused.  She only ambulated to the bathroom and back to the bed, refused to sit in the chair today.  Patient reported being in pain when she moved around, medication was given each time requested, but still refused to walk when pain subsided.

## 2012-03-22 NOTE — Progress Notes (Signed)
2 Days Post-Op  Subjective: Tolerating diet well. No nausea or vomiting have been noted. She has not had a bowel movement yet. She denies flatus.  Objective: Vital signs in last 24 hours: Temp:  [97.6 F (36.4 C)-98.7 F (37.1 C)] 97.6 F (36.4 C) (01/09 0629) Pulse Rate:  [64-74] 74  (01/09 0629) Resp:  [16-18] 18  (01/09 0629) BP: (109-143)/(48-84) 132/68 mmHg (01/09 0629) SpO2:  [93 %-94 %] 93 % (01/09 0629) Last BM Date: 03/16/12  Intake/Output from previous day: 01/08 0701 - 01/09 0700 In: 960 [P.O.:360; I.V.:250; Blood:350] Out: 1600 [Urine:1600] Intake/Output this shift:    General appearance: alert, cooperative and no distress Resp: clear to auscultation bilaterally Cardio: regular rate and rhythm, S1, S2 normal, no murmur, click, rub or gallop GI: Soft, nondistended. Dressings dry and intact. Occasional bowel sounds appreciated.  Lab Results:   Basename 03/22/12 0537 03/21/12 1537 03/21/12 0504  WBC 9.4 -- 7.0  HGB 12.0 12.3 --  HCT 37.0 37.0 --  PLT 230 -- 134*   BMET  Basename 03/22/12 0537 03/21/12 0504  NA 139 136  K 3.6 3.9  CL 104 100  CO2 29 28  GLUCOSE 106* 79  BUN 10 10  CREATININE 1.10 1.03  CALCIUM 8.4 7.9*   PT/INR No results found for this basename: LABPROT:2,INR:2 in the last 72 hours  Studies/Results: No results found.  Anti-infectives: Anti-infectives     Start     Dose/Rate Route Frequency Ordered Stop   03/20/12 0749   ceFAZolin (ANCEF) IVPB 2 g/50 mL premix        2 g 100 mL/hr over 30 Minutes Intravenous On call to O.R. 03/20/12 0749 03/20/12 0950   03/18/12 1030   ceFAZolin (ANCEF) IVPB 2 g/50 mL premix  Status:  Discontinued        2 g 100 mL/hr over 30 Minutes Intravenous On call to O.R. 03/18/12 1018 03/18/12 1032          Assessment/Plan: s/p Procedure(s): LAPAROSCOPIC INCISIONAL HERNIA INSERTION OF MESH Impression: Stable postoperatively. Did receive one unit of packed red blood cells. Hemoglobin stable.  Question lab error yesterday which would explain low hemoglobin and stable hemoglobin today. Awaiting return of bowel function. Have encouraged patient to ambulate in hallway.  LOS: 6 days    Jayant Kriz A 03/22/2012

## 2012-03-22 NOTE — Progress Notes (Signed)
Patient informed that Dr. Lovell Sheehan ordered Milk of Magnesia bid to be given, patient refused the medication and requested warmed prune juice instead.

## 2012-03-23 NOTE — Progress Notes (Signed)
Pt sat in chair x 3 hours then back to bed, ambulated in room to bathroom and to chair, but not in hall.

## 2012-03-23 NOTE — Progress Notes (Signed)
Patient ambulated in hallway with walker with one staff member present approximately 150 feet.  Patient tolerated walk well with no distress noted.

## 2012-03-23 NOTE — Progress Notes (Signed)
3 Days Post-Op  Subjective: Patient refused milk of magnesia yesterday. She states that she was trying prune juice. No bowel movement has been noted yet. Tolerating diet.  Objective: Vital signs in last 24 hours: Temp:  [97.8 F (36.6 C)-98.4 F (36.9 C)] 97.8 F (36.6 C) (01/10 0547) Pulse Rate:  [72-76] 76  (01/10 0547) Resp:  [18] 18  (01/10 0547) BP: (138-182)/(80-83) 138/81 mmHg (01/10 0547) SpO2:  [95 %-97 %] 95 % (01/10 0547) Last BM Date: 03/16/12 (warm prune juice given)  Intake/Output from previous day: 01/09 0701 - 01/10 0700 In: 902.5 [P.O.:480; I.V.:422.5] Out: 800 [Urine:800] Intake/Output this shift:    General appearance: alert and no distress GI: Soft. Dressings dry and intact. Abdominal binder in place. Bowel sounds appreciated.  Lab Results:   Basename 03/22/12 0537 03/21/12 1537 03/21/12 0504  WBC 9.4 -- 7.0  HGB 12.0 12.3 --  HCT 37.0 37.0 --  PLT 230 -- 134*   BMET  Basename 03/22/12 0537 03/21/12 0504  NA 139 136  K 3.6 3.9  CL 104 100  CO2 29 28  GLUCOSE 106* 79  BUN 10 10  CREATININE 1.10 1.03  CALCIUM 8.4 7.9*   PT/INR No results found for this basename: LABPROT:2,INR:2 in the last 72 hours  Studies/Results: No results found.  Anti-infectives: Anti-infectives     Start     Dose/Rate Route Frequency Ordered Stop   03/20/12 0749   ceFAZolin (ANCEF) IVPB 2 g/50 mL premix        2 g 100 mL/hr over 30 Minutes Intravenous On call to O.R. 03/20/12 0749 03/20/12 0950   03/18/12 1030   ceFAZolin (ANCEF) IVPB 2 g/50 mL premix  Status:  Discontinued        2 g 100 mL/hr over 30 Minutes Intravenous On call to O.R. 03/18/12 1018 03/18/12 1032          Assessment/Plan: s/p Procedure(s): LAPAROSCOPIC INCISIONAL HERNIA INSERTION OF MESH Impression: Stable postoperatively. Awaiting return of bowel function. Have encouraged patient to get out of bed.  LOS: 7 days    Sandeep Radell A 03/23/2012

## 2012-03-23 NOTE — Progress Notes (Signed)
Patient ambulated in the hall with staff >100 feet.

## 2012-03-24 MED ORDER — PANTOPRAZOLE SODIUM 40 MG PO TBEC
40.0000 mg | DELAYED_RELEASE_TABLET | Freq: Two times a day (BID) | ORAL | Status: DC
  Administered 2012-03-24 – 2012-03-26 (×5): 40 mg via ORAL
  Filled 2012-03-24 (×5): qty 1

## 2012-03-24 NOTE — Progress Notes (Signed)
4 Days Post-Op  Subjective: Patient somewhat anxious about increasing diet. She is more comfortable now that the Prilosec has been resumed. She denies any nausea. Pain is moderately controlled.  Objective: Vital signs in last 24 hours: Temp:  [97.3 F (36.3 C)-98.8 F (37.1 C)] 97.3 F (36.3 C) (01/11 0557) Pulse Rate:  [73-79] 73  (01/11 0557) Resp:  [18-20] 20  (01/11 0557) BP: (120-131)/(69-79) 120/69 mmHg (01/11 0557) SpO2:  [95 %-97 %] 95 % (01/11 0557) Last BM Date: 03/16/12  Intake/Output from previous day: 01/10 0701 - 01/11 0700 In: 1007.5 [P.O.:600; I.V.:407.5] Out: -  Intake/Output this shift:    General appearance: alert and no distress GI: Positive bowel sounds, soft, mild to moderate expected postoperative tenderness. No peritoneal signs.  Lab Results:   Basename 03/22/12 0537 03/21/12 1537  WBC 9.4 --  HGB 12.0 12.3  HCT 37.0 37.0  PLT 230 --   BMET  Basename 03/22/12 0537  NA 139  K 3.6  CL 104  CO2 29  GLUCOSE 106*  BUN 10  CREATININE 1.10  CALCIUM 8.4   PT/INR No results found for this basename: LABPROT:2,INR:2 in the last 72 hours ABG No results found for this basename: PHART:2,PCO2:2,PO2:2,HCO3:2 in the last 72 hours  Studies/Results: No results found.  Anti-infectives: Anti-infectives     Start     Dose/Rate Route Frequency Ordered Stop   03/20/12 0749   ceFAZolin (ANCEF) IVPB 2 g/50 mL premix        2 g 100 mL/hr over 30 Minutes Intravenous On call to O.R. 03/20/12 0749 03/20/12 0950   03/18/12 1030   ceFAZolin (ANCEF) IVPB 2 g/50 mL premix  Status:  Discontinued        2 g 100 mL/hr over 30 Minutes Intravenous On call to O.R. 03/18/12 1018 03/18/12 1032          Assessment/Plan: s/p Procedure(s) (LRB) with comments: LAPAROSCOPIC INCISIONAL HERNIA (N/A) INSERTION OF MESH (N/A) increase activity. Increase diet. Advance patient for his regular diet. Continue Prilosec. Ambulate patient Margo Aye. Hopeful discharge in the next 24  hours.  LOS: 8 days    Aneka Fagerstrom C 03/24/2012

## 2012-03-25 LAB — TYPE AND SCREEN
ABO/RH(D): O POS
Unit division: 0

## 2012-03-25 NOTE — Progress Notes (Signed)
5 Days Post-Op  Subjective: Pain somewhat better.  Tolerating PO.  +BM today.  Objective: Vital signs in last 24 hours: Temp:  [97.5 F (36.4 C)-98 F (36.7 C)] 98 F (36.7 C) (01/12 2041) Pulse Rate:  [75-82] 82  (01/12 2041) Resp:  [16-18] 18  (01/12 2041) BP: (112-134)/(48-72) 118/72 mmHg (01/12 2041) SpO2:  [95 %-98 %] 97 % (01/12 2041) Last BM Date: 03/25/12  Intake/Output from previous day: 01/11 0701 - 01/12 0700 In: 720 [P.O.:720] Out: -  Intake/Output this shift:    General appearance: alert and no distress GI: soft, expected tenderness.  No peritoneal signs.  Incisions clean dry and intact.  Lab Results:  No results found for this basename: WBC:2,HGB:2,HCT:2,PLT:2 in the last 72 hours BMET No results found for this basename: NA:2,K:2,CL:2,CO2:2,GLUCOSE:2,BUN:2,CREATININE:2,CALCIUM:2 in the last 72 hours PT/INR No results found for this basename: LABPROT:2,INR:2 in the last 72 hours ABG No results found for this basename: PHART:2,PCO2:2,PO2:2,HCO3:2 in the last 72 hours  Studies/Results: No results found.  Anti-infectives: Anti-infectives     Start     Dose/Rate Route Frequency Ordered Stop   03/20/12 0749   ceFAZolin (ANCEF) IVPB 2 g/50 mL premix        2 g 100 mL/hr over 30 Minutes Intravenous On call to O.R. 03/20/12 0749 03/20/12 0950   03/18/12 1030   ceFAZolin (ANCEF) IVPB 2 g/50 mL premix  Status:  Discontinued        2 g 100 mL/hr over 30 Minutes Intravenous On call to O.R. 03/18/12 1018 03/18/12 1032          Assessment/Plan: s/p Procedure(s) (LRB) with comments: LAPAROSCOPIC INCISIONAL HERNIA (N/A) INSERTION OF MESH (N/A) Overall doing well.  Increase activity.  Change to PO pain medications.  Hopeful d/c later today otherwise in AM.    LOS: 9 days    Marquesha Robideau C 03/25/2012

## 2012-03-26 NOTE — Progress Notes (Signed)
Discharge instructions given, verbalized understanding, out in stable condition via w/c with staff. 

## 2012-03-26 NOTE — Progress Notes (Signed)
UR chart review completed.  

## 2012-03-26 NOTE — Discharge Summary (Signed)
Physician Discharge Summary  Patient ID: Christina Fry MRN: [id number] DOB/AGE: 1940/09/14 73 y.o.  Admit date: 03/16/2012 Discharge date: 03/26/2012  Admission Diagnoses: Small bowel obstruction, incarcerated incisional hernia  Discharge Diagnoses: Same Active Problems:  * No active hospital problems. *    Discharged Condition: good  Hospital Course: Patient is a 72 year old white female presented emergency room with worsening nausea and vomiting. CT scan the abdomen revealed an incarcerated incisional hernia as well as a partial small bowel obstruction. Surgery was consulted and the patient was admitted the hospital for further evaluation treatment. A nasogastric tube was placed. Once her bowel obstruction was decompressed, she underwent a laparoscopic recurrent incisional herniorrhaphy with mesh on 03/20/2012. She tolerated the surgery well. Postoperative course was remarkable for a postoperative ileus. 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 She did receive one unit packed red blood cells. Her diet was advanced at difficulty once her bowel function returned. She is being discharged home in good and improving condition.   Treatments: surgery: Recurrent laparoscopic incisional herniorrhaphy with mesh on 03/20/2012   Discharge Exam: Blood pressure 103/45, pulse 76, temperature 97.6 F (36.4 C), temperature source Oral, resp. rate 18, height 5\' 3"  (1.6 m), weight 78 kg (171 lb 15.3 oz), SpO2 98.00%. General appearance: alert, cooperative and no distress Resp: clear to auscultation bilaterally Cardio: regular rate and rhythm, S1, S2 normal, no murmur, click, rub or gallop GI: soft, non-tender; bowel sounds normal; no masses,  no organomegaly and Incisions healing well.  Disposition: 01-Home or Self Care     Medication List     As of 03/26/2012  9:02 AM    TAKE these medications         acyclovir 200 MG capsule   Commonly known as:  ZOVIRAX   Take 400 mg by mouth 2 (two) times daily.      artificial tears Oint ophthalmic ointment   Apply 1 application to eye at bedtime.      aspirin EC 81 MG tablet   Take 81 mg by mouth daily.      CALCIUM 600+D HIGH POTENCY 600-400 MG-UNIT per tablet   Generic drug: Calcium Carbonate-Vitamin D   Take 1 tablet by mouth daily.      cholecalciferol 400 UNITS Tabs   Commonly known as: VITAMIN D   Take 400 Units by mouth daily.      clotrimazole 1 % cream   Commonly known as: LOTRIMIN   Apply 1 application topically 2 (two) times daily.      cycloSPORINE 0.05 % ophthalmic emulsion   Commonly known as: RESTASIS   Place 1 drop into both eyes daily.      ferrous gluconate 324 MG tablet   Commonly known as: FERGON   Take 324 mg by mouth at bedtime.      gabapentin 100 MG capsule   Commonly known as: NEURONTIN   Take 200 mg by mouth at bedtime.      HYDROcodone-acetaminophen 10-325 MG per tablet   Commonly known as: NORCO   Take 1-2 tablets by mouth 2 (two) times daily as needed. pain      hydroxychloroquine 200 MG tablet   Commonly known as: PLAQUENIL   Take 400 mg by mouth daily.      hydroxypropyl methylcellulose 2.5 % ophthalmic solution   Commonly known as: ISOPTO TEARS   Place 1 drop into both eyes daily as needed. Dry eyes      levothyroxine 112  MCG tablet   Commonly known as: SYNTHROID, LEVOTHROID   Take 112 mcg by mouth daily.      metoprolol tartrate 25 MG tablet   Commonly known as: LOPRESSOR   Take 25 mg by mouth 2 (two) times daily.      multivitamins ther. w/minerals Tabs   Take 1 tablet by mouth daily.      nortriptyline 25 MG capsule   Commonly known as: PAMELOR   Take 25 mg by mouth at bedtime.      omeprazole 20 MG capsule   Commonly known as: PRILOSEC   Take 40 mg by mouth 2 (two) times daily.      simvastatin 80 MG tablet   Commonly known as: ZOCOR   Take 40 mg by mouth at bedtime.           Follow-up Information    Follow up  with Dalia Heading, MD. Schedule an appointment as soon as possible for a visit on 04/03/2012.   Contact information:   [address] [place] [address] [phone]          Signed: Franky Macho A 03/26/2012, 9:02 AM

## 2014-04-01 ENCOUNTER — Emergency Department (HOSPITAL_COMMUNITY): Payer: Non-veteran care

## 2014-04-01 ENCOUNTER — Encounter (HOSPITAL_COMMUNITY): Payer: Self-pay | Admitting: Emergency Medicine

## 2014-04-01 ENCOUNTER — Emergency Department (HOSPITAL_COMMUNITY)
Admission: EM | Admit: 2014-04-01 | Discharge: 2014-04-01 | Disposition: A | Discharge status: Home / Self Care | Payer: Non-veteran care | Attending: Emergency Medicine | Admitting: Emergency Medicine

## 2014-04-01 DIAGNOSIS — E785 Hyperlipidemia, unspecified: Secondary | ICD-10-CM | POA: Insufficient documentation

## 2014-04-01 DIAGNOSIS — Z79899 Other long term (current) drug therapy: Secondary | ICD-10-CM | POA: Diagnosis not present

## 2014-04-01 DIAGNOSIS — Z7952 Long term (current) use of systemic steroids: Secondary | ICD-10-CM | POA: Diagnosis not present

## 2014-04-01 DIAGNOSIS — Z8619 Personal history of other infectious and parasitic diseases: Secondary | ICD-10-CM | POA: Insufficient documentation

## 2014-04-01 DIAGNOSIS — S4992XA Unspecified injury of left shoulder and upper arm, initial encounter: Secondary | ICD-10-CM | POA: Diagnosis present

## 2014-04-01 DIAGNOSIS — W19XXXA Unspecified fall, initial encounter: Secondary | ICD-10-CM

## 2014-04-01 DIAGNOSIS — Y9289 Other specified places as the place of occurrence of the external cause: Secondary | ICD-10-CM | POA: Insufficient documentation

## 2014-04-01 DIAGNOSIS — E119 Type 2 diabetes mellitus without complications: Secondary | ICD-10-CM | POA: Insufficient documentation

## 2014-04-01 DIAGNOSIS — Y9389 Activity, other specified: Secondary | ICD-10-CM | POA: Insufficient documentation

## 2014-04-01 DIAGNOSIS — K219 Gastro-esophageal reflux disease without esophagitis: Secondary | ICD-10-CM | POA: Insufficient documentation

## 2014-04-01 DIAGNOSIS — W1839XA Other fall on same level, initial encounter: Secondary | ICD-10-CM | POA: Diagnosis not present

## 2014-04-01 DIAGNOSIS — Y998 Other external cause status: Secondary | ICD-10-CM | POA: Insufficient documentation

## 2014-04-01 DIAGNOSIS — S4292XA Fracture of left shoulder girdle, part unspecified, initial encounter for closed fracture: Secondary | ICD-10-CM

## 2014-04-01 DIAGNOSIS — F419 Anxiety disorder, unspecified: Secondary | ICD-10-CM | POA: Insufficient documentation

## 2014-04-01 DIAGNOSIS — S42202A Unspecified fracture of upper end of left humerus, initial encounter for closed fracture: Secondary | ICD-10-CM | POA: Diagnosis not present

## 2014-04-01 DIAGNOSIS — S29002A Unspecified injury of muscle and tendon of back wall of thorax, initial encounter: Secondary | ICD-10-CM | POA: Diagnosis not present

## 2014-04-01 DIAGNOSIS — Z859 Personal history of malignant neoplasm, unspecified: Secondary | ICD-10-CM | POA: Diagnosis not present

## 2014-04-01 HISTORY — DX: Malignant (primary) neoplasm, unspecified: C80.1

## 2014-04-01 IMAGING — DX DG CHEST 1V
1 series · 1 of 1 positions shown · non-contrast
Comparison: [DATE]

CLINICAL DATA: Recent fall on hardwood floor with left shoulder
pain and chest pain

EXAM:
CHEST - 1 VIEW

[chest ap]
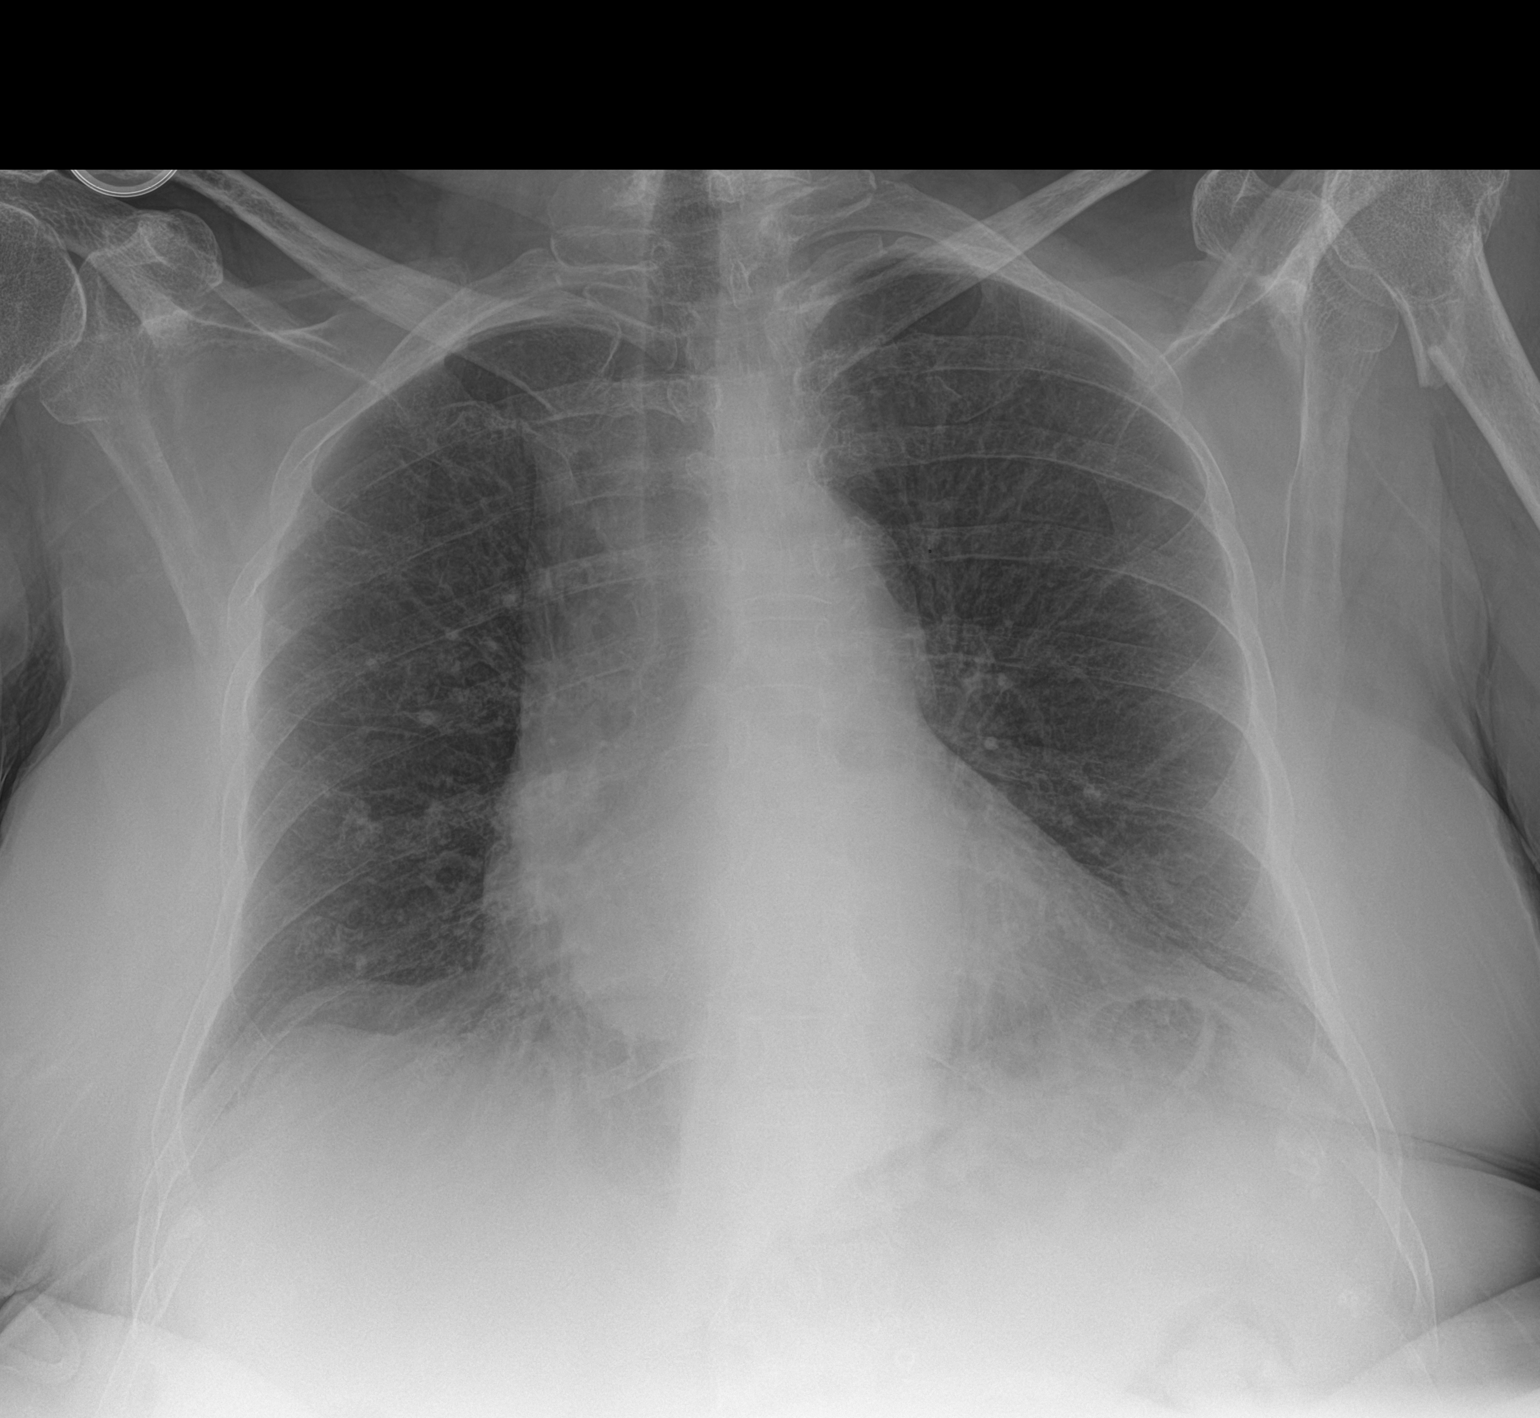

[1 of 1 positions shown; findings below may reference images not displayed]

FINDINGS: A proximal left humeral fracture is again noted. Cardiac shadow is
mildly enlarged but stable. The lungs are clear bilaterally. No
pneumothorax is seen no definitive rib fracture is identified.
IMPRESSION: Proximal left humeral fracture. No acute intrathoracic abnormality
is noted.

## 2014-04-01 IMAGING — DX DG SHOULDER 2+V*L*
2 series · 2 of 2 positions shown · non-contrast
Comparison: None.

CLINICAL DATA: Fall on hardwood for with left shoulder pain,
initial encounter

EXAM:
LEFT SHOULDER - 2+ VIEW

[shoulder ap]
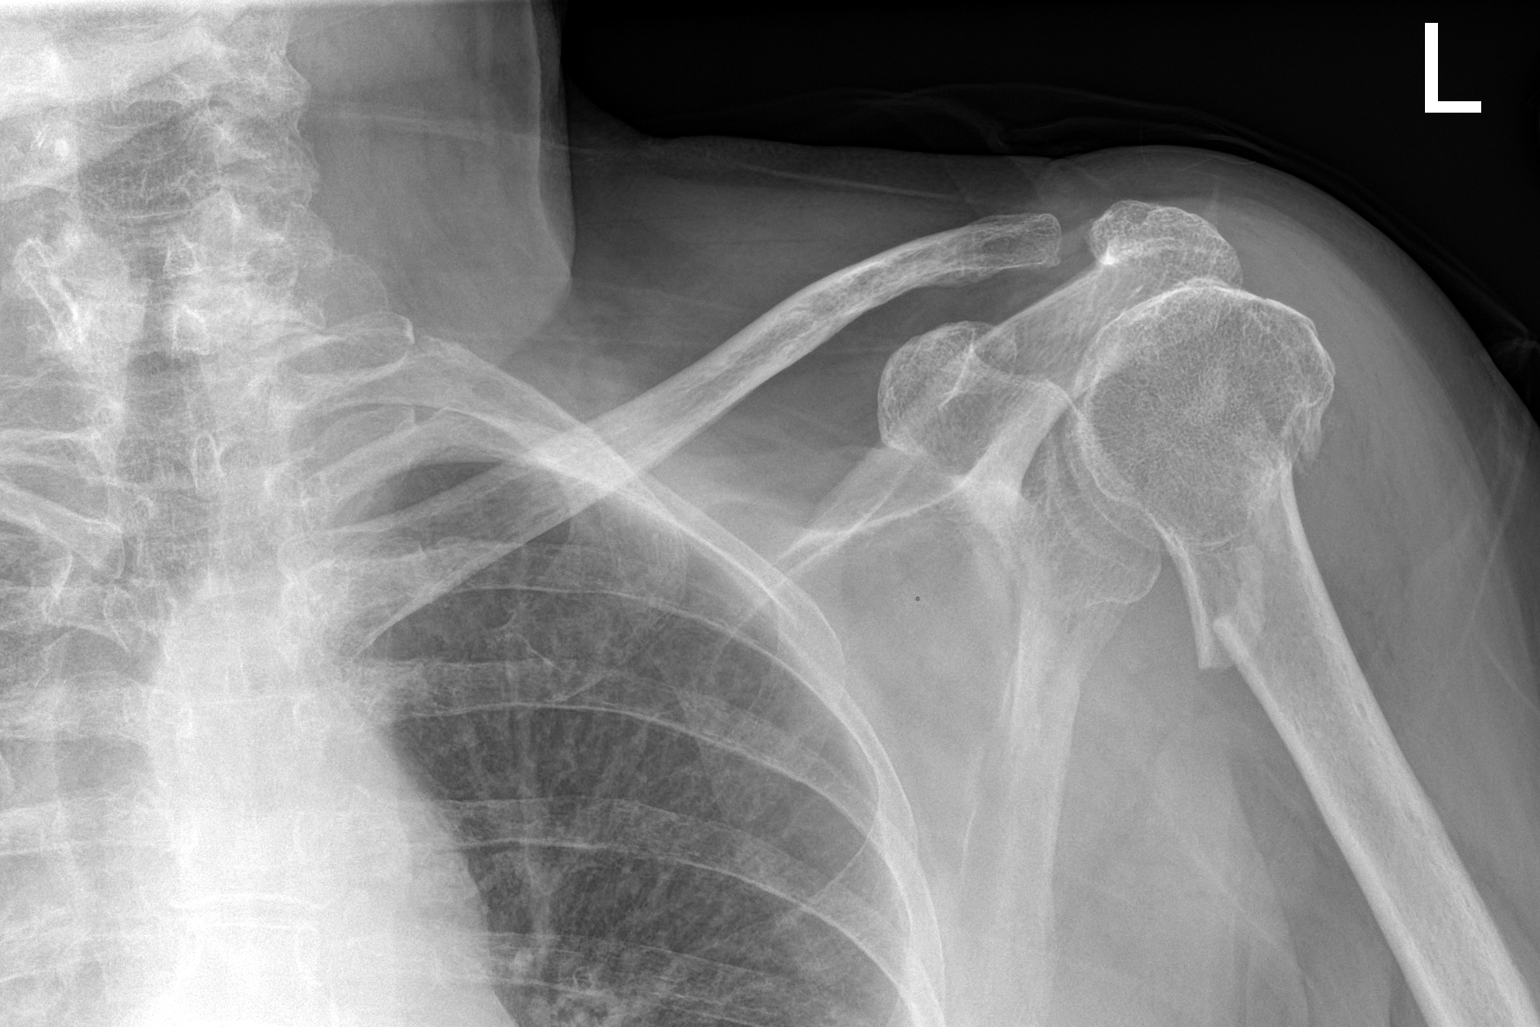

[shoulder y view]
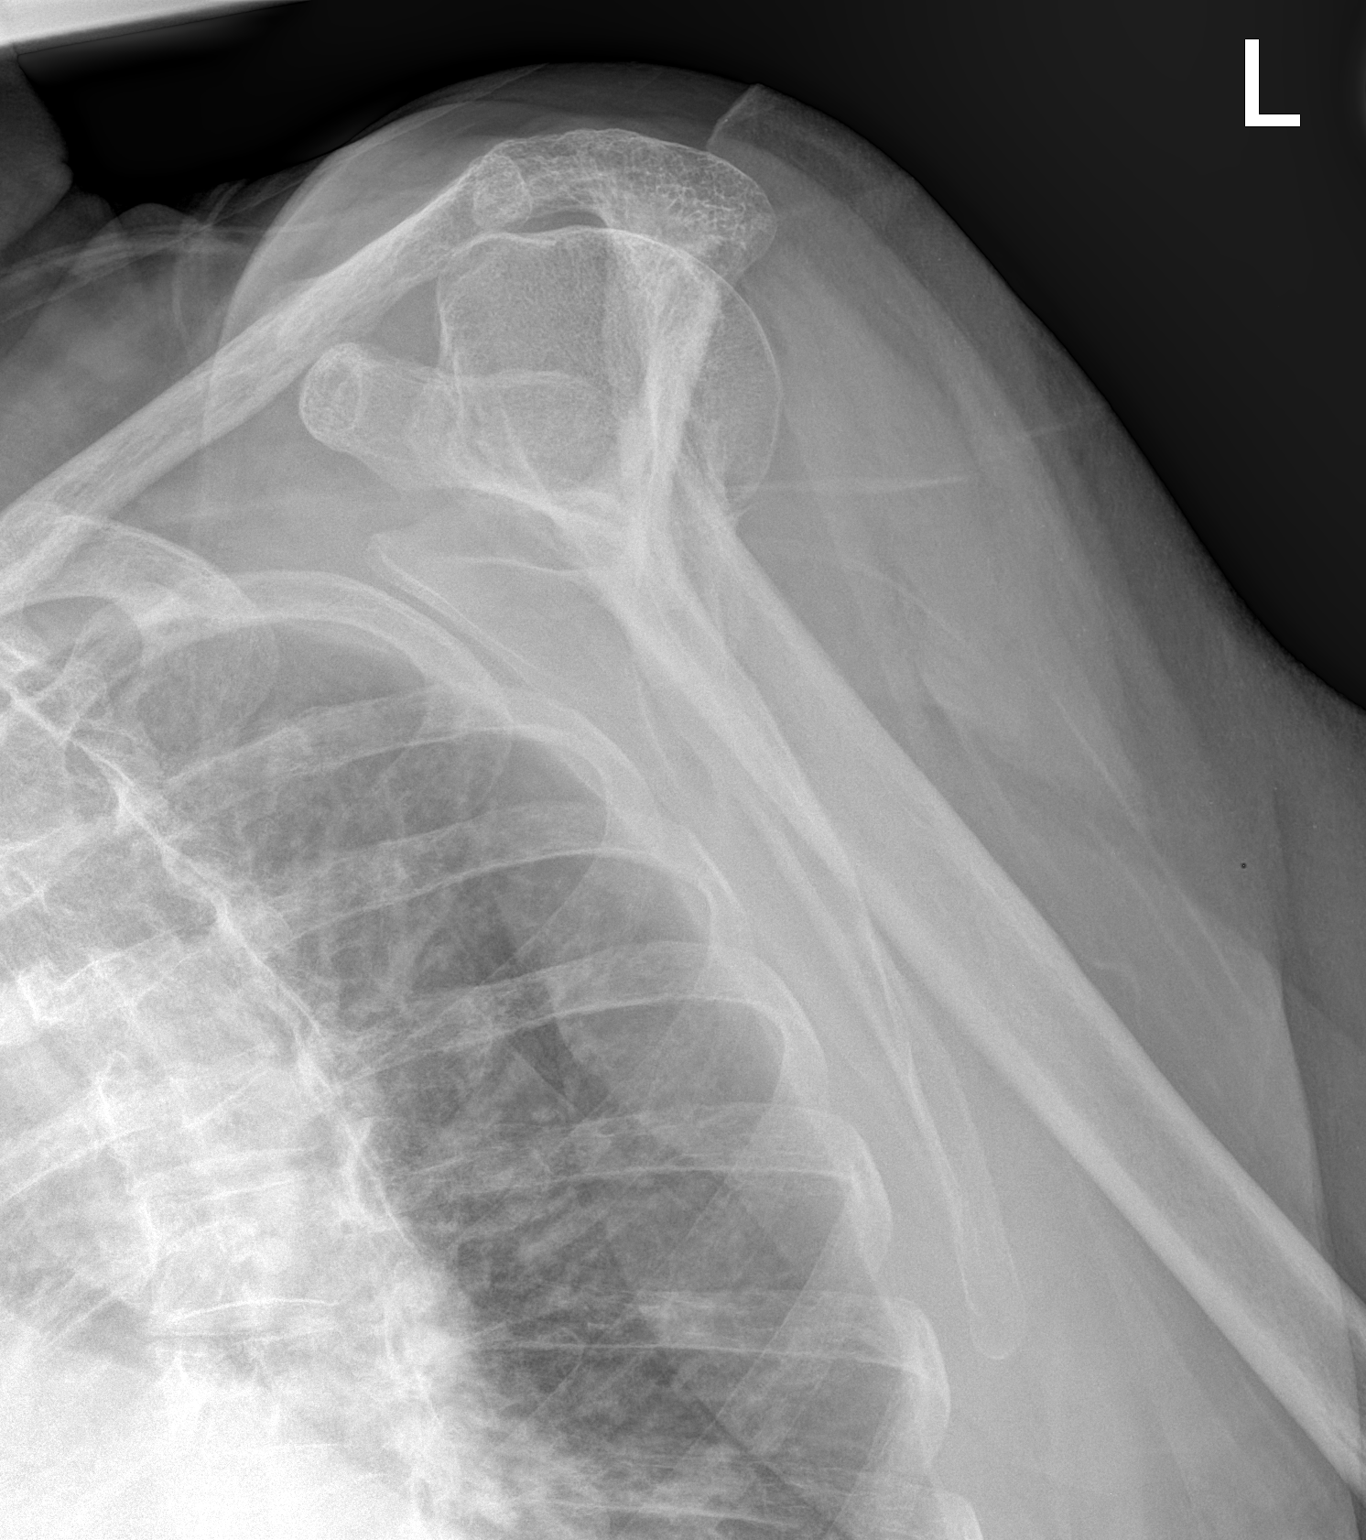

[2 of 2 positions shown; findings below may reference images not displayed]

FINDINGS: There is an oblique fracture through the proximal left humerus
involving a portion of the surgical neck. Mild angulation at the
fracture site is seen. Some mild impaction is noted as well.
IMPRESSION: Proximal left humeral fracture as described.

## 2014-04-01 MED ORDER — HYDROCODONE-ACETAMINOPHEN 5-325 MG PO TABS: 1.0000 | ORAL_TABLET | Freq: Four times a day (QID) | ORAL | Status: DC | PRN

## 2014-04-01 MED ORDER — HYDROMORPHONE HCL 1 MG/ML IJ SOLN
1.0000 mg | Freq: Once | INTRAMUSCULAR | Status: AC
  Administered 2014-04-01: 1 mg via INTRAVENOUS
  Filled 2014-04-01: qty 1

## 2014-04-01 MED ORDER — ONDANSETRON HCL 4 MG/2ML IJ SOLN
4.0000 mg | Freq: Once | INTRAMUSCULAR | Status: AC
  Administered 2014-04-01: 4 mg via INTRAVENOUS
  Filled 2014-04-01: qty 2

## 2014-04-01 MED ORDER — HYDROMORPHONE HCL 1 MG/ML IJ SOLN
INTRAMUSCULAR | Status: AC
  Filled 2014-04-01: qty 1

## 2014-04-01 MED ORDER — HYDROMORPHONE HCL 1 MG/ML IJ SOLN
0.5000 mg | Freq: Once | INTRAMUSCULAR | Status: AC
  Administered 2014-04-01: 0.5 mg via INTRAVENOUS

## 2014-04-01 NOTE — ED Notes (Signed)
MD at bedside. 

## 2014-04-01 NOTE — ED Provider Notes (Signed)
CSN: [id number]     Arrival date & time 04/01/14  1817 History   First MD Initiated Contact with Patient 04/01/14 1822     This chart was scribed for Maudry Diego, MD by Forrestine Him, ED Scribe. This patient was seen in room [facility]03/[facility]03 and the patient's care was started 6:25 PM.   Chief Complaint  Patient presents with  . Fall  . Shoulder Pain   Patient is a 74 y.o. female presenting with fall and shoulder pain. The history is provided by the patient. No language interpreter was used.  Fall This is a new problem. The current episode started 1 to 2 hours ago. The problem occurs constantly. The problem has not changed since onset.Nothing relieves the symptoms. She has tried nothing for the symptoms.  Shoulder Pain Associated symptoms: no fever     HPI Comments: Christina Fry brought in by EMS is a 74 y.o. female with a PMHx of grave's disease, GERD, hyperlipidemia, fibromyalgia, cancer, and DM who presents to the Emergency Department complaining of a fall that occurred just prior to arrival. Pt states she fell while in her kitchen landing on her R side. She denies any head trauma or LOC at time of fall. Pt now c/o of constant, moderate L shoulder pain along to pain to the upper back.Pt states she is not able to move her L arm secondary to pain. She has not tried any OTC medications or home remedies to help manage symptoms. However, Ms. Portier was given 10 mg of Morphine en route to ED. No recent fever or chills. Pt with known allergies to Iohexol, Codeine, Morphine, Nitrofuran derivatives, and Oxycodone.  Past Medical History  Diagnosis Date  . Grave's disease   . Anxiety   . GERD (gastroesophageal reflux disease)   . Hyperlipidemia   . Herpes   . Polymyalgia rheumatica   . Fibromyalgia   . Palpitation   . Diabetes mellitus without complication   . Cancer    Past Surgical History  Procedure Laterality Date  . Appendectomy    . Abdominal hysterectomy    . Hernia repair    . Carpal  tunnel release    . Total abdominal hysterectomy w/ bilateral salpingoophorectomy    . Incisional hernia repair  03/20/2012    Procedure: LAPAROSCOPIC INCISIONAL HERNIA;  Surgeon: Jamesetta So, MD;  Location: [facility];  Service: General;  Laterality: N/A;  . Insertion of mesh  03/20/2012    Procedure: INSERTION OF MESH;  Surgeon: Jamesetta So, MD;  Location: [facility];  Service: General;  Laterality: N/A;   No family history on file. History  Substance Use Topics  . Smoking status: Never Smoker   . Smokeless tobacco: Not on file  . Alcohol Use: No   OB History    No data available     Review of Systems  Constitutional: Negative for fever and chills.  Musculoskeletal: Positive for arthralgias.  All other systems reviewed and are negative.     Allergies  Iohexol; Codeine; Morphine and related; Nitrofuran derivatives; and Oxycodone-acetaminophen  Home Medications   Prior to Admission medications   Medication Sig Start Date End Date Taking? Authorizing Provider  acyclovir (ZOVIRAX) 200 MG capsule Take 400 mg by mouth 2 (two) times daily.      Historical Provider, MD  artificial tears (LACRILUBE) OINT ophthalmic ointment Apply 1 application to eye at bedtime.    Historical Provider, MD  aspirin EC 81 MG tablet Take 81 mg by  mouth daily.      Historical Provider, MD  Calcium Carbonate-Vitamin D (CALCIUM 600+D HIGH POTENCY) 600-400 MG-UNIT per tablet Take 1 tablet by mouth daily.      Historical Provider, MD  cholecalciferol (VITAMIN D) 400 UNITS TABS Take 400 Units by mouth daily.    Historical Provider, MD  clotrimazole (LOTRIMIN) 1 % cream Apply 1 application topically 2 (two) times daily.    Historical Provider, MD  cycloSPORINE (RESTASIS) 0.05 % ophthalmic emulsion Place 1 drop into both eyes daily.    Historical Provider, MD  ferrous gluconate (FERGON) 324 MG tablet Take 324 mg by mouth at bedtime.      Historical Provider, MD  gabapentin (NEURONTIN) 100 MG capsule Take 200 mg by  mouth at bedtime.      Historical Provider, MD  HYDROcodone-acetaminophen (NORCO) 10-325 MG per tablet Take 1-2 tablets by mouth 2 (two) times daily as needed. pain     Historical Provider, MD  hydroxychloroquine (PLAQUENIL) 200 MG tablet Take 400 mg by mouth daily.      Historical Provider, MD  hydroxypropyl methylcellulose (ISOPTO TEARS) 2.5 % ophthalmic solution Place 1 drop into both eyes daily as needed. Dry eyes     Historical Provider, MD  levothyroxine (SYNTHROID, LEVOTHROID) 112 MCG tablet Take 112 mcg by mouth daily.      Historical Provider, MD  metoprolol tartrate (LOPRESSOR) 25 MG tablet Take 25 mg by mouth 2 (two) times daily.      Historical Provider, MD  Multiple Vitamins-Minerals (MULTIVITAMINS THER. W/MINERALS) TABS Take 1 tablet by mouth daily.      Historical Provider, MD  nortriptyline (PAMELOR) 25 MG capsule Take 25 mg by mouth at bedtime.      Historical Provider, MD  omeprazole (PRILOSEC) 20 MG capsule Take 40 mg by mouth 2 (two) times daily.      Historical Provider, MD  simvastatin (ZOCOR) 80 MG tablet Take 40 mg by mouth at bedtime.      Historical Provider, MD   Triage Vitals: BP 149/70 mmHg  Pulse 82  Temp(Src) 97.6 F (36.4 C) (Oral)  Resp 18  Ht 5\' 3"  (1.6 m)  Wt 171 lb 2 oz (77.622 kg)  BMI 30.32 kg/m2  SpO2 98%   Physical Exam  Constitutional: She is oriented to person, place, and time. She appears well-developed.  HENT:  Head: Normocephalic.  Eyes: Conjunctivae are normal.  Neck: No tracheal deviation present.  Cardiovascular:  No murmur heard. Musculoskeletal: Normal range of motion. She exhibits tenderness.  Moderate L shoulder tenderness  Moderate L upper back tenderness  Neurological: She is oriented to person, place, and time.  L arm neurovascularly intact   Skin: Skin is warm.  Psychiatric: She has a normal mood and affect.    ED Course  Procedures (including critical care time)  DIAGNOSTIC STUDIES: Oxygen Saturation is 98% on RA,  Normal by my interpretation.    COORDINATION OF CARE: 6:34 PM- Will give Dilaudid and Zofran. Will order DG shoulder L and DG chest 2 view. Discussed treatment plan with pt at bedside and pt agreed to plan.     Labs Review Labs Reviewed - No data to display  Imaging Review No results found.   EKG Interpretation None      MDM   Final diagnoses:  None    Shoulder fx,   tx with sling,  Follow up and hydrocodone  I personally performed the services described in this documentation, which was scribed in my presence. The  recorded information has been reviewed and is accurate.    Maudry Diego, MD 04/01/14 2019

## 2014-04-01 NOTE — ED Notes (Addendum)
PT stated she slipped and fell into the kitchen floor and landed on her left shoulder. PT c/o left shoulder pain with decreased ROM. PT denies hitting her head or LOC. PT was given 10mg  of Morphine in route with EMS in IV.

## 2014-04-01 NOTE — Discharge Instructions (Signed)
Follow up with Dr. Luna Glasgow or your own orthopedic md in one week.

## 2014-04-03 NOTE — Care Management Note (Signed)
Received phone call from Mrs. Caprice Beaver, patient's daughter. She is concerned about her mom being at home alone, with a fractured shoulder, in a sling.  She feels her mom needs 24-hour care for a short time, until she is more stable, with her arm in a sling.  She has been there since the patient came home from the hospital ED last night, but since she works she feels she cannot stay every day. She asked about what is available in the community to offer 24 hour care daily, to her mom. Explained that there is nothing in the community that Medicare will cover for  24 hour care. Some of the ALFs and possibly some of the skilled nursing facilities offer respite care  by the weeks or 1 to 2 months, but this would be private pay. There are also participating agencies such as [facility], [employer], [facility], etc, which are listed in the phone book. These would supply 24 hour a day caregivers in the home. These would also be private pay. Mrs Gammage is a disabled veteran. Suggest she call [employer], and ask about Aid and Attendance services. A&A services take several weeks, sometimes, so she would still have to get someone for the current episode as private pay and may possibly be able to be reimbursed if A&A is approved.  Mrs. Caprice Beaver expresses appreciation for the information given

## 2018-04-04 ENCOUNTER — Encounter: Payer: Self-pay | Admitting: Allergy & Immunology

## 2018-04-04 ENCOUNTER — Ambulatory Visit (INDEPENDENT_AMBULATORY_CARE_PROVIDER_SITE_OTHER): Payer: PRIVATE HEALTH INSURANCE | Admitting: Allergy & Immunology

## 2018-04-04 VITALS — BP 126/80 | HR 66 | Temp 99.5°F | Resp 16 | Ht 60.75 in | Wt 164.6 lb

## 2018-04-04 DIAGNOSIS — J3089 Other allergic rhinitis: Secondary | ICD-10-CM | POA: Diagnosis not present

## 2018-04-04 DIAGNOSIS — J302 Other seasonal allergic rhinitis: Secondary | ICD-10-CM

## 2018-04-04 DIAGNOSIS — J4520 Mild intermittent asthma, uncomplicated: Secondary | ICD-10-CM | POA: Diagnosis not present

## 2018-04-04 HISTORY — DX: Mild intermittent asthma, uncomplicated: J45.20

## 2018-04-04 MED ORDER — TRIAMCINOLONE ACETONIDE 55 MCG/ACT NA AERO: 1.0000 | INHALATION_SPRAY | Freq: Every day | NASAL | 5 refills | Status: DC

## 2018-04-04 NOTE — Progress Notes (Signed)
NEW PATIENT  Date of Service/Encounter:  04/04/18  Referring provider: Ferdie Ping, MD   Assessment:   Mild intermittent asthma without complication  Seasonal and perennial allergic rhinitis (trees, grass, weeds, ragweed, molds, dust mite, cat dog)   Ms. Waldschmidt presents to establish care. She has a history of P/SAR with sensitizations to multiple antigens. She did start allergy shots, but then decided to change to our practice since it is closer. Per the patient, she is skeptical what is actually in the allergy shots and thinks that these are not mixed correctly at all. I did review the physician's website and she is not even trained in allergy, therefore I would tend to agree that they were not mixed correctly. Therefore we will mix them in our practice and restart her from the beginning. Consent signed today.  Plan/Recommendations:   1. Seasonal and perennial allergic rhinitis - We will remix your vials today and you can start in two weeks.  - In the meantime, start Nasacort one spray per nostril daily (aim for the ear on each side). - Continue with Claritin 10mg  as needed. - Make an appointment in one week for allergy shots in our clinic.   2. Return in about 6 months (around 10/03/2018).  Subjective:   Norine Reddington Kinnick is a 78 y.o. female presenting today for evaluation of  Chief Complaint  Patient presents with  [facility]    Cresenciano Lick Azua has a history of the following: Patient Active Problem List   Diagnosis Date Noted  . Seasonal and perennial allergic rhinitis 04/04/2018  . Mild intermittent asthma without complication 58/85/0277    History obtained from: chart review and patient and her daughter.  Cresenciano Lick Marines was referred by Ferdie Ping, MD.     Kimm is a 78 y.o. female presenting to establish care for her allergic rhinitis.  Hoogendoorn presents to establish care with a new provider for her allergic rhinitis and pruritis. She states she has  had years of symptoms, which worsened when she moved in with her daughter on her farm. Her symptoms consist of pruritis, sneezing, runny nose, and watery/itchy eyes. She had skin testing done at previous physician in August which was positive for elm, box elder, pine, red cedar, johnson grass, baccharis weed, cocklebur, English plantain, lambs quarter, pigweed, cockroach, dust mite, dog, cat, Penicillium, and Bipolaris. This testing was done by Dr. Dickey Gave at [facility]. She did start allergy shots and has felt better since she has been on them. She was getting weekly injections but she is unsure where she was in the advance. However she tells me today that she was unsure whether the shots were mixed correctly. I did review her credentials and it does not seem that she is trained in allergy.   She also had food testing that was positive to milk, wheat, and sesame. However she never had any reactions to foods and she never avoided them.   She currently takes Claritin Daily and PRN Hydroxyzine for itiching, which improved but did not control her symptoms. She started allergy shots from September to November, which she states was significantly improving her symptoms. Her symptoms have worsened since leaving her previous provider and stopping the shots. She decided to change providers for location conveinence and personal preference. ? She does not have a history of asthma or COPD, but does get bronchitis intermittently per her daughter. She is prescried an albuterol inhaler that she  takes as needed when she is having a respiratory illness.  Otherwise, there is no history of other atopic diseases, including food allergies, drug allergies, stinging insect allergies, eczema or urticaria. There is no significant infectious history. Vaccinations are up to date.    Past Medical History: Patient Active Problem List   Diagnosis Date Noted  . Seasonal and perennial allergic rhinitis 04/04/2018  . Mild  intermittent asthma without complication 09/47/0962    Medication List:  Allergies as of 04/04/2018      Reactions   Iohexol Shortness Of Breath   Pt stopped breathing at [place]. Pt refuses IV dye   Codeine Itching   Morphine And Related Itching   Nitrofuran Derivatives Other (See Comments)   Unknown   Oxycodone-acetaminophen Itching      Medication List       Accurate as of April 04, 2018  9:51 PM. Always use your most recent med list.        acetaminophen 325 MG tablet Commonly known as:  TYLENOL Take 650 mg by mouth every 6 (six) hours as needed.   acyclovir 200 MG capsule Commonly known as:  ZOVIRAX Take 400 mg by mouth 2 (two) times daily.   CALCIUM 600+D HIGH POTENCY 600-400 MG-UNIT tablet Generic drug:  Calcium Carbonate-Vitamin D Take 1 tablet by mouth daily.   cholecalciferol 10 MCG (400 UNIT) Tabs tablet Commonly known as:  VITAMIN D3 Take 400 Units by mouth daily.   Docusate Sodium 100 MG capsule Take 100 mg by mouth 2 (two) times daily.   gabapentin 100 MG capsule Commonly known as:  NEURONTIN Take 200 mg by mouth at bedtime.   levothyroxine 88 MCG tablet Commonly known as:  SYNTHROID, LEVOTHROID Take 88 mcg by mouth daily before breakfast.   metoprolol tartrate 25 MG tablet Commonly known as:  LOPRESSOR Take 25 mg by mouth 2 (two) times daily.   nortriptyline 25 MG capsule Commonly known as:  PAMELOR Take 25 mg by mouth at bedtime.   omeprazole 20 MG capsule Commonly known as:  PRILOSEC Take 40 mg by mouth 2 (two) times daily.   triamcinolone 55 MCG/ACT Aero nasal inhaler Commonly known as:  NASACORT Place 1 spray into the nose daily.       Birth History: non-contributory  Developmental History: non-contributory.   Past Surgical History: Past Surgical History:  Procedure Laterality Date  . ABDOMINAL HYSTERECTOMY    . APPENDECTOMY    . CARPAL TUNNEL RELEASE    . HERNIA REPAIR    . INCISIONAL HERNIA REPAIR  03/20/2012    Procedure: LAPAROSCOPIC INCISIONAL HERNIA;  Surgeon: Jamesetta So, MD;  Location: [facility];  Service: General;  Laterality: N/A;  . INSERTION OF MESH  03/20/2012   Procedure: INSERTION OF MESH;  Surgeon: Jamesetta So, MD;  Location: [facility];  Service: General;  Laterality: N/A;  . TOTAL ABDOMINAL HYSTERECTOMY W/ BILATERAL SALPINGOOPHORECTOMY       Family History: Family History  Problem Relation Age of Onset  . Allergic rhinitis Neg Hx   . Asthma Neg Hx   . Eczema Neg Hx   . Urticaria Neg Hx      Social History: Kymiah lives at home by herself.  She lives in a house on the property of her daughter and son-in-law.  There is hardwood throughout the home.  She has electric heating and central cooling.  There is one cat in the house as well as multiple animals outside of the house.  She  lives on land owned by her daughter, and they have a hobby farm that includes meals, [place], goats, cows, chickens, geese, turkeys, alpacas, and dogs.  There are no dust mite covers on the bedding.  There is no tobacco exposure.  She is retired from [employer].  She had to the [employer] when her kids were in their early teens.    Review of Systems: a 14-point review of systems is pertinent for what is mentioned in HPI.  Otherwise, all other systems were negative. Constitutional: negative other than that listed in the HPI Eyes: negative other than that listed in the HPI Ears, nose, mouth, throat, and face: negative other than that listed in the HPI Respiratory: negative other than that listed in the HPI Cardiovascular: negative other than that listed in the HPI Gastrointestinal: negative other than that listed in the HPI Genitourinary: negative other than that listed in the HPI Integument: negative other than that listed in the HPI Hematologic: negative other than that listed in the HPI Musculoskeletal: negative other than that listed in the HPI Neurological: negative other than that listed in the  HPI Allergy/Immunologic: negative other than that listed in the HPI    Objective:   Blood pressure 126/80, pulse 66, temperature 99.5 F (37.5 C), temperature source Oral, resp. rate 16, height 5' 0.75" (1.543 m), weight 164 lb 9.6 oz (74.7 kg), SpO2 96 %. Body mass index is 31.36 kg/m.   Physical Exam:  General: Alert, interactive, in no acute distress. Pleasant elderly female.  Eyes: No conjunctival injection bilaterally, no discharge on the right, no discharge on the left, no Horner-Trantas dots present and allergic shiners present bilaterally. PERRL bilaterally. EOMI without pain. No photophobia.  Ears: Right TM pearly gray with normal light reflex, Left TM pearly gray with normal light reflex, Right TM intact without perforation and Left TM intact without perforation.  Nose/Throat: External nose within normal limits and septum midline. Turbinates edematous and pale with clear discharge. Posterior oropharynx erythematous without cobblestoning in the posterior oropharynx. Tonsils 2+ without exudates.  Tongue without thrush. Neck: Supple without thyromegaly. Trachea midline. Adenopathy: no enlarged lymph nodes appreciated in the anterior cervical, occipital, axillary, epitrochlear, inguinal, or popliteal regions. Lungs: Clear to auscultation without wheezing, rhonchi or rales. No increased work of breathing. CV: Normal S1/S2. No murmurs. Capillary refill <2 seconds.  Abdomen: Nondistended, nontender. No guarding or rebound tenderness. Bowel sounds present in all fields and hyperactive  Skin: Warm and dry, without lesions or rashes. Extremities:  No clubbing, cyanosis or edema. Neuro:   Grossly intact. No focal deficits appreciated. Responsive to questions.  Diagnostic studies:   Spirometry: results abnormal (FEV1: 0.97/70%, FVC: 1.15/55%, FEV1/FVC: 83%).    Spirometry consistent with possible restrictive disease.  Allergy Studies: none        Salvatore Marvel, MD [facility]  [place] [facility] [place]

## 2018-04-04 NOTE — Patient Instructions (Addendum)
1. Seasonal and perennial allergic rhinitis - We will remix your vials today and you can start in two weeks.  - In the meantime, start Nasacort one spray per nostril daily (aim for the ear on each side). - Continue with Claritin 10mg  as needed. - Make an appointment in one week for allergy shots in our clinic.   2. Return in about 6 months (around 10/03/2018).   Please inform us of any Emergency Department visits, hospitalizations, or changes in symptoms. Call us before going to the ED for breathing or allergy symptoms since we might be able to fit you in for a sick visit. Feel free to contact us anytime with any questions, problems, or concerns.  It was a pleasure to meet you and your family today!  Websites that have reliable patient information: 1. American Academy of Asthma, Allergy, and Immunology: www.aaaai.org 2. Food Allergy Research and Education (FARE): foodallergy.org 3. Mothers of Asthmatics: http://www.asthmacommunitynetwork.org 4. American College of Allergy, Asthma, and Immunology: MonthlyElectricBill.co.uk   Make sure you are registered to vote! If you have moved or changed any of your contact information, you will need to get this updated before voting!    Voter ID laws are going into effect for the General Election in November 2020! Be prepared! Check out [email or web address] for more details.

## 2018-04-05 ENCOUNTER — Telehealth: Payer: Self-pay

## 2018-04-05 DIAGNOSIS — J301 Allergic rhinitis due to pollen: Secondary | ICD-10-CM

## 2018-04-05 MED ORDER — FLUTICASONE PROPIONATE 50 MCG/ACT NA SUSP: 1.0000 | Freq: Two times a day (BID) | NASAL | 5 refills | Status: DC

## 2018-04-05 NOTE — Addendum Note (Signed)
Addended by: Valere Dross on: 04/05/2018 10:06 AM   Modules accepted: Orders

## 2018-04-05 NOTE — Progress Notes (Signed)
VIALS EXP 04-06-2019

## 2018-04-05 NOTE — Telephone Encounter (Signed)
Rx sent to patient pharmacy.

## 2018-04-05 NOTE — Telephone Encounter (Signed)
Fluticasone one spray per nostril BID is fine.  Salvatore Marvel, MD [facility]

## 2018-04-05 NOTE — Telephone Encounter (Signed)
Pharmacist from [facility] called and stated that they do not carry Nasacort Rx in their pharmacy. Please send in generic Flonase instead. Please advise?

## 2018-04-06 DIAGNOSIS — J3089 Other allergic rhinitis: Secondary | ICD-10-CM

## 2018-04-18 ENCOUNTER — Ambulatory Visit: Payer: PRIVATE HEALTH INSURANCE

## 2018-04-20 ENCOUNTER — Ambulatory Visit: Payer: PRIVATE HEALTH INSURANCE

## 2018-04-25 ENCOUNTER — Ambulatory Visit (INDEPENDENT_AMBULATORY_CARE_PROVIDER_SITE_OTHER): Payer: PRIVATE HEALTH INSURANCE

## 2018-04-25 DIAGNOSIS — J309 Allergic rhinitis, unspecified: Secondary | ICD-10-CM

## 2018-04-25 MED ORDER — EPINEPHRINE 0.3 MG/0.3ML IJ SOAJ: 0.3000 mg | Freq: Once | INTRAMUSCULAR | 1 refills | Status: AC

## 2018-04-25 NOTE — Progress Notes (Signed)
Immunotherapy   Patient Details  Name: Christina Fry MRN: [id number] Date of Birth: Feb 20, 1941  04/25/2018  [name] started allergy injections. Patient received 0.05 of both her blue vials, one for Grass-Weeds-Trees-Cat-Dog and Molds-CR-DM. Patient waited 30 minutes with no problems Following schedule: B Frequency: 1-2 times weekly Epi-Pen: Epi pen sent to [facility]. Consent signed and patient instructions given.   Herbie Drape 04/25/2018, 10:40 AM

## 2018-05-04 ENCOUNTER — Ambulatory Visit (INDEPENDENT_AMBULATORY_CARE_PROVIDER_SITE_OTHER): Payer: PRIVATE HEALTH INSURANCE

## 2018-05-04 DIAGNOSIS — J309 Allergic rhinitis, unspecified: Secondary | ICD-10-CM | POA: Diagnosis not present

## 2018-05-11 ENCOUNTER — Ambulatory Visit (INDEPENDENT_AMBULATORY_CARE_PROVIDER_SITE_OTHER): Payer: PRIVATE HEALTH INSURANCE

## 2018-05-11 DIAGNOSIS — J309 Allergic rhinitis, unspecified: Secondary | ICD-10-CM | POA: Diagnosis not present

## 2018-05-16 ENCOUNTER — Ambulatory Visit (INDEPENDENT_AMBULATORY_CARE_PROVIDER_SITE_OTHER): Payer: PRIVATE HEALTH INSURANCE

## 2018-05-16 DIAGNOSIS — J309 Allergic rhinitis, unspecified: Secondary | ICD-10-CM

## 2018-08-24 ENCOUNTER — Other Ambulatory Visit: Payer: Non-veteran care

## 2018-08-24 ENCOUNTER — Other Ambulatory Visit: Payer: Self-pay

## 2018-08-24 ENCOUNTER — Ambulatory Visit: Payer: Self-pay

## 2018-08-24 DIAGNOSIS — Z20822 Contact with and (suspected) exposure to covid-19: Secondary | ICD-10-CM

## 2018-08-24 NOTE — Progress Notes (Unsigned)
Daughter would like to be contacted with COVID testing [phone]

## 2018-08-27 LAB — NOVEL CORONAVIRUS, NAA: SARS-CoV-2, NAA: NOT DETECTED

## 2018-09-26 ENCOUNTER — Other Ambulatory Visit: Payer: Self-pay

## 2018-09-26 ENCOUNTER — Ambulatory Visit (INDEPENDENT_AMBULATORY_CARE_PROVIDER_SITE_OTHER): Payer: PRIVATE HEALTH INSURANCE

## 2018-09-26 DIAGNOSIS — J309 Allergic rhinitis, unspecified: Secondary | ICD-10-CM | POA: Diagnosis not present

## 2018-10-03 ENCOUNTER — Other Ambulatory Visit: Payer: Self-pay

## 2018-10-03 ENCOUNTER — Ambulatory Visit (INDEPENDENT_AMBULATORY_CARE_PROVIDER_SITE_OTHER): Payer: PRIVATE HEALTH INSURANCE

## 2018-10-03 DIAGNOSIS — J309 Allergic rhinitis, unspecified: Secondary | ICD-10-CM | POA: Diagnosis not present

## 2018-10-12 ENCOUNTER — Other Ambulatory Visit: Payer: Self-pay

## 2018-10-12 ENCOUNTER — Ambulatory Visit (INDEPENDENT_AMBULATORY_CARE_PROVIDER_SITE_OTHER): Payer: PRIVATE HEALTH INSURANCE

## 2018-10-12 DIAGNOSIS — J309 Allergic rhinitis, unspecified: Secondary | ICD-10-CM

## 2018-10-19 ENCOUNTER — Other Ambulatory Visit: Payer: Self-pay

## 2018-10-19 ENCOUNTER — Ambulatory Visit (INDEPENDENT_AMBULATORY_CARE_PROVIDER_SITE_OTHER): Payer: PRIVATE HEALTH INSURANCE

## 2018-10-19 DIAGNOSIS — J309 Allergic rhinitis, unspecified: Secondary | ICD-10-CM | POA: Diagnosis not present

## 2018-10-31 ENCOUNTER — Ambulatory Visit (INDEPENDENT_AMBULATORY_CARE_PROVIDER_SITE_OTHER): Payer: PRIVATE HEALTH INSURANCE

## 2018-10-31 DIAGNOSIS — J309 Allergic rhinitis, unspecified: Secondary | ICD-10-CM

## 2018-11-14 ENCOUNTER — Ambulatory Visit (INDEPENDENT_AMBULATORY_CARE_PROVIDER_SITE_OTHER): Payer: PRIVATE HEALTH INSURANCE | Admitting: *Deleted

## 2018-11-14 DIAGNOSIS — J309 Allergic rhinitis, unspecified: Secondary | ICD-10-CM

## 2018-11-28 ENCOUNTER — Ambulatory Visit (INDEPENDENT_AMBULATORY_CARE_PROVIDER_SITE_OTHER): Payer: PRIVATE HEALTH INSURANCE | Admitting: *Deleted

## 2018-11-28 DIAGNOSIS — J309 Allergic rhinitis, unspecified: Secondary | ICD-10-CM | POA: Diagnosis not present

## 2018-12-12 ENCOUNTER — Ambulatory Visit (INDEPENDENT_AMBULATORY_CARE_PROVIDER_SITE_OTHER): Payer: PRIVATE HEALTH INSURANCE

## 2018-12-12 DIAGNOSIS — J309 Allergic rhinitis, unspecified: Secondary | ICD-10-CM

## 2018-12-21 ENCOUNTER — Ambulatory Visit (INDEPENDENT_AMBULATORY_CARE_PROVIDER_SITE_OTHER): Payer: PRIVATE HEALTH INSURANCE

## 2018-12-21 DIAGNOSIS — J309 Allergic rhinitis, unspecified: Secondary | ICD-10-CM

## 2018-12-24 ENCOUNTER — Other Ambulatory Visit: Payer: Self-pay

## 2018-12-24 ENCOUNTER — Encounter (HOSPITAL_COMMUNITY): Payer: Self-pay

## 2018-12-24 ENCOUNTER — Emergency Department (HOSPITAL_COMMUNITY)
Admission: EM | Admit: 2018-12-24 | Discharge: 2018-12-25 | Disposition: A | Discharge status: Home / Self Care | Payer: No Typology Code available for payment source | Attending: Emergency Medicine | Admitting: Emergency Medicine

## 2018-12-24 ENCOUNTER — Emergency Department (HOSPITAL_COMMUNITY): Payer: No Typology Code available for payment source

## 2018-12-24 DIAGNOSIS — R0782 Intercostal pain: Secondary | ICD-10-CM | POA: Insufficient documentation

## 2018-12-24 DIAGNOSIS — E119 Type 2 diabetes mellitus without complications: Secondary | ICD-10-CM | POA: Diagnosis not present

## 2018-12-24 DIAGNOSIS — W19XXXA Unspecified fall, initial encounter: Secondary | ICD-10-CM | POA: Insufficient documentation

## 2018-12-24 DIAGNOSIS — Z79899 Other long term (current) drug therapy: Secondary | ICD-10-CM | POA: Insufficient documentation

## 2018-12-24 IMAGING — DX DG RIBS W/ CHEST 3+V*R*
3 series · 3 of 3 positions shown · non-contrast
Comparison: None.

CLINICAL DATA: Multiple falls. Right lower posterior rib pain.

EXAM:
RIGHT RIBS AND CHEST - 3+ VIEW

[rib pa]
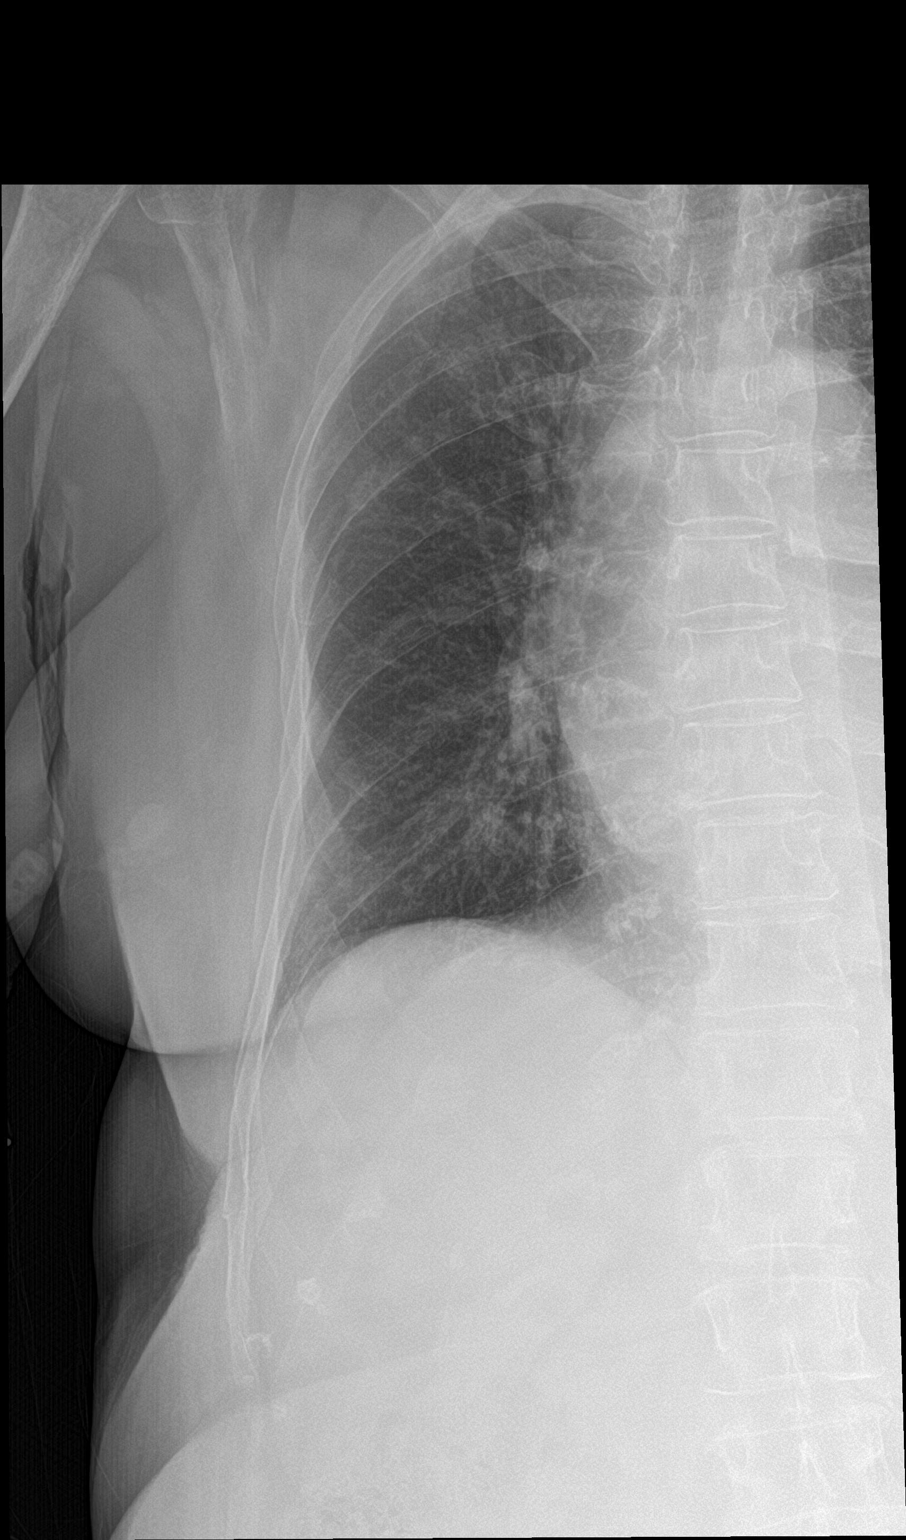

[rib pa obl]
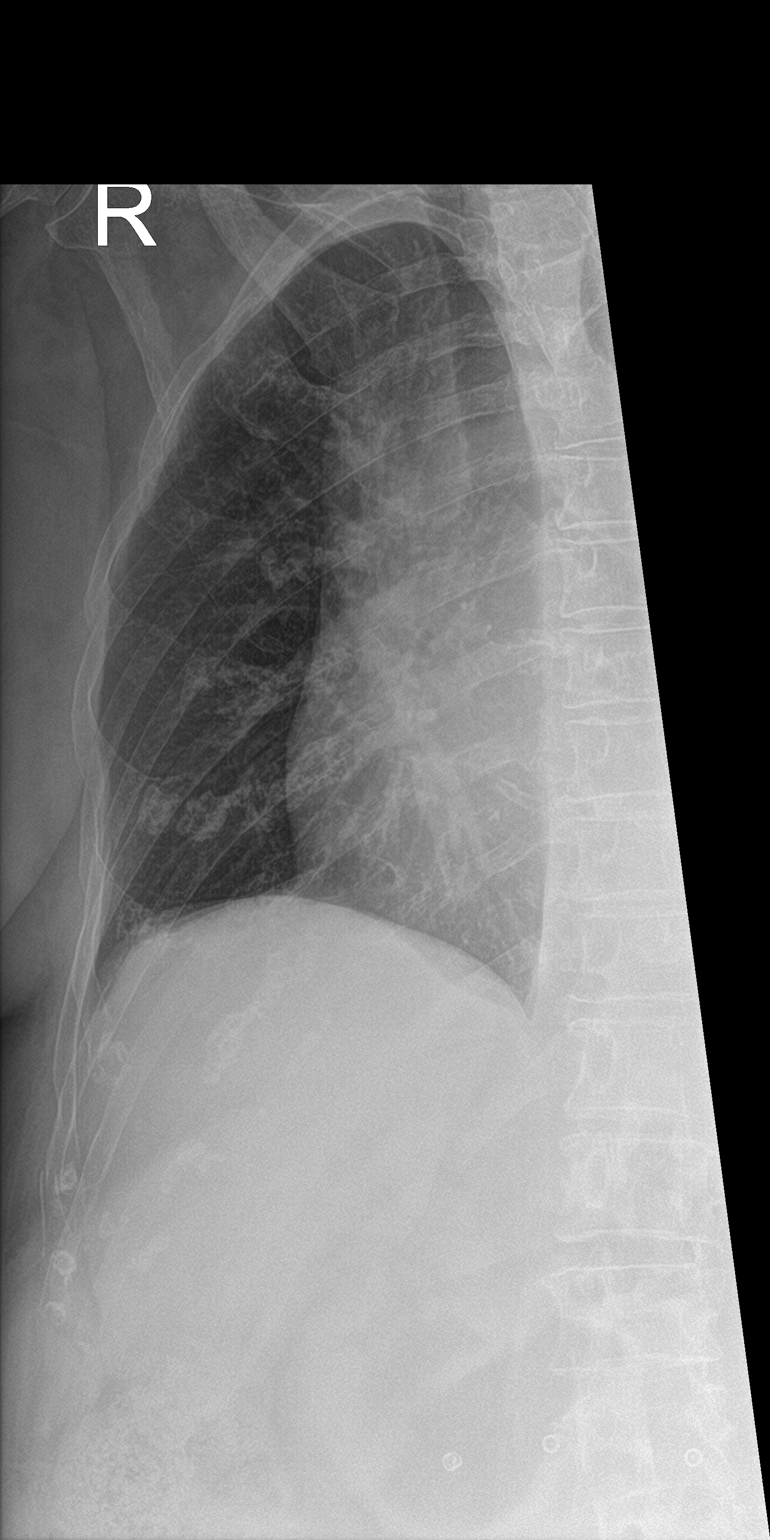

[chest ap]
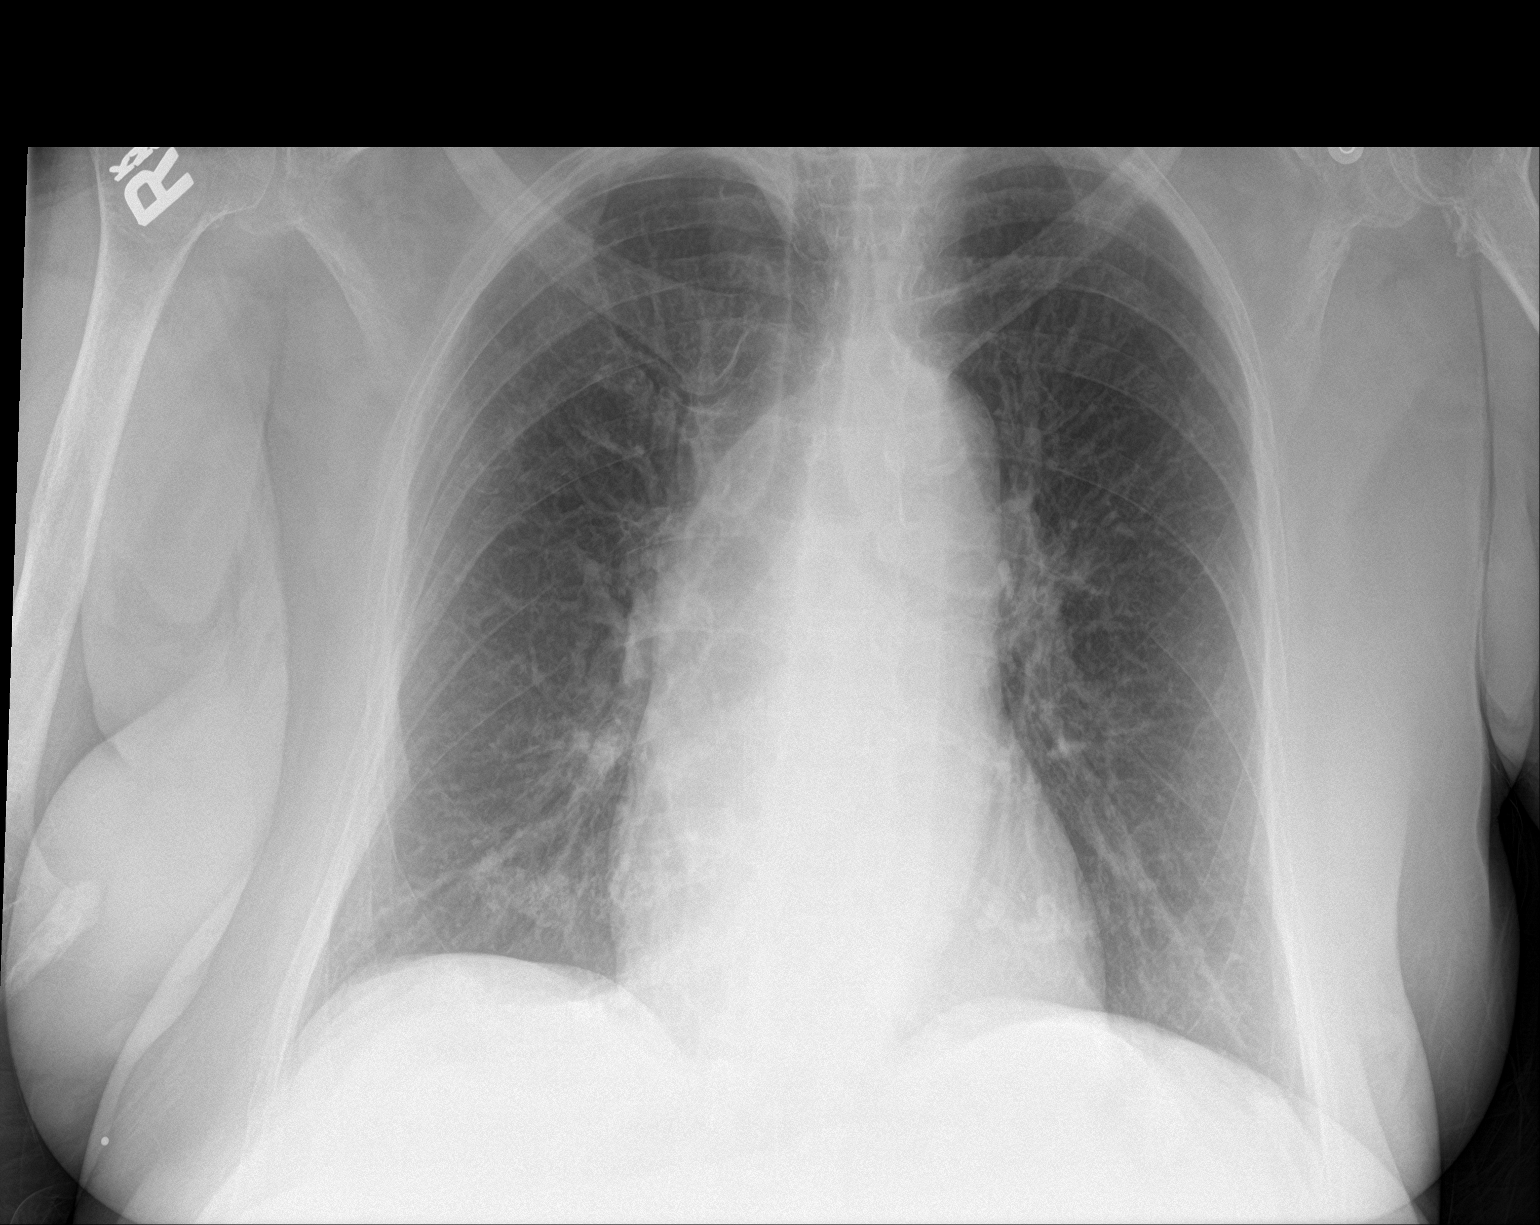

[3 of 3 positions shown; findings below may reference images not displayed]

FINDINGS: Minimally displaced right posterolateral sixth and eighth rib
fractures.

No evidence of pneumothorax.

Soft tissues are grossly normal.
IMPRESSION: 1. Minimally displaced right posterolateral sixth and eighth rib
fractures.
2. No evidence of pneumothorax.

## 2018-12-24 MED ORDER — HYDROCODONE-ACETAMINOPHEN 5-325 MG PO TABS: 1.0000 | ORAL_TABLET | Freq: Four times a day (QID) | ORAL | 0 refills | Status: DC | PRN

## 2018-12-24 NOTE — ED Triage Notes (Addendum)
[facility] [place] Friday and his her right ribs and armpit. [facility] again today. 160mcg of fent by ems. 20g r ac.

## 2018-12-24 NOTE — ED Notes (Signed)
Patient waiting on transportation by family. Patient given discharge instructions and prescription.

## 2018-12-24 NOTE — ED Triage Notes (Signed)
Pt said that her cat tripped her on Friday causing her to fall. Then today her leg gave out causing her to land on her knees.

## 2018-12-24 NOTE — Discharge Instructions (Addendum)
Follow-up with your doctor if any problems.  Take Tylenol for pain or you can take the Vicodin if Tylenol is not helping

## 2018-12-24 NOTE — ED Notes (Signed)
edp assessing patient

## 2018-12-24 NOTE — ED Provider Notes (Signed)
[facility] EMERGENCY DEPARTMENT Provider Note   CSN: [id number] Arrival date & time: 12/24/18  1956     History   Chief Complaint Chief Complaint  Patient presents with  . Fall    HPI Christina Fry is a 78 y.o. female.     Patient fell a few days ago complains of right-sided chest pain.  The history is provided by the patient. No language interpreter was used.  Fall This is a new problem. The current episode started more than 2 days ago. The problem occurs constantly. The problem has not changed since onset.Associated symptoms include chest pain. Pertinent negatives include no abdominal pain and no headaches. The symptoms are aggravated by bending. Nothing relieves the symptoms. She has tried nothing for the symptoms.    Past Medical History:  Diagnosis Date  . Anxiety   . Cancer ([place])   . Diabetes mellitus without complication ([place])   . Fibromyalgia   . GERD (gastroesophageal reflux disease)   . Grave's disease   . Herpes   . Hyperlipidemia   . Mild intermittent asthma without complication [id number]  . Palpitation   . Polymyalgia rheumatica [facility])     Patient Active Problem List   Diagnosis Date Noted  . Seasonal and perennial allergic rhinitis 04/04/2018  . Mild intermittent asthma without complication [id number]    Past Surgical History:  Procedure Laterality Date  . ABDOMINAL HYSTERECTOMY    . APPENDECTOMY    . CARPAL TUNNEL RELEASE    . HERNIA REPAIR    . INCISIONAL HERNIA REPAIR  03/20/2012   Procedure: LAPAROSCOPIC INCISIONAL HERNIA;  Surgeon: Jamesetta So, MD;  Location: [facility];  Service: General;  Laterality: N/A;  . INSERTION OF MESH  03/20/2012   Procedure: INSERTION OF MESH;  Surgeon: Jamesetta So, MD;  Location: [facility];  Service: General;  Laterality: N/A;  . TOTAL ABDOMINAL HYSTERECTOMY W/ BILATERAL SALPINGOOPHORECTOMY       OB History   No obstetric history on file.      Home Medications    Prior to Admission medications    Medication Sig Start Date End Date Taking? Authorizing Provider  acetaminophen (TYLENOL) 325 MG tablet Take 650 mg by mouth every 6 (six) hours as needed.    [provider]  acyclovir (ZOVIRAX) 200 MG capsule Take 400 mg by mouth 2 (two) times daily.      [provider]  Calcium Carbonate-Vitamin D (CALCIUM 600+D HIGH POTENCY) 600-400 MG-UNIT per tablet Take 1 tablet by mouth daily.      [provider]  cholecalciferol (VITAMIN D) 400 UNITS TABS Take 400 Units by mouth daily.    [provider]  Docusate Sodium 100 MG capsule Take 100 mg by mouth 2 (two) times daily.    [provider]  fluticasone (FLONASE) 50 MCG/ACT nasal spray Place 1 spray into both nostrils 2 (two) times daily. 04/05/18   Valentina Shaggy, MD  gabapentin (NEURONTIN) 100 MG capsule Take 200 mg by mouth at bedtime.      [provider]  HYDROcodone-acetaminophen (NORCO/VICODIN) 5-325 MG tablet Take 1 tablet by mouth every 6 (six) hours as needed for moderate pain. 12/24/18   Milton Ferguson, MD  levothyroxine (SYNTHROID, LEVOTHROID) 88 MCG tablet Take 88 mcg by mouth daily before breakfast.    [provider]  metoprolol tartrate (LOPRESSOR) 25 MG tablet Take 25 mg by mouth 2 (two) times daily.      [provider]  nortriptyline (PAMELOR)  25 MG capsule Take 25 mg by mouth at bedtime.      [provider]  omeprazole (PRILOSEC) 20 MG capsule Take 40 mg by mouth 2 (two) times daily.      [provider]    Family History Family History  Problem Relation Age of Onset  . Allergic rhinitis Neg Hx   . Asthma Neg Hx   . Eczema Neg Hx   . Urticaria Neg Hx     Social History Social History   Tobacco Use  . Smoking status: Never Smoker  . Smokeless tobacco: Never Used  Substance Use Topics  . Alcohol use: No  . Drug use: No     Allergies   Iohexol, Codeine, Morphine and related, Nitrofuran derivatives, and  Oxycodone-acetaminophen   Review of Systems Review of Systems  Constitutional: Negative for appetite change and fatigue.  HENT: Negative for congestion, ear discharge and sinus pressure.   Eyes: Negative for discharge.  Respiratory: Negative for cough.   Cardiovascular: Positive for chest pain.  Gastrointestinal: Negative for abdominal pain and diarrhea.  Genitourinary: Negative for frequency and hematuria.  Musculoskeletal: Negative for back pain.  Skin: Negative for rash.  Neurological: Negative for seizures and headaches.  Psychiatric/Behavioral: Negative for hallucinations.     Physical Exam Updated Vital Signs BP 136/81 (BP Location: Left Arm)   Pulse 84   Temp 98.6 F (37 C) (Oral)   Resp 17   Wt 74.7 kg   SpO2 96%   BMI 31.38 kg/m   Physical Exam Vitals signs and nursing note reviewed.  Constitutional:      Appearance: She is well-developed.  HENT:     Head: Normocephalic.     Nose: Nose normal.  Eyes:     General: No scleral icterus.    Conjunctiva/sclera: Conjunctivae normal.  Neck:     Musculoskeletal: Neck supple.     Thyroid: No thyromegaly.  Cardiovascular:     Rate and Rhythm: Normal rate and regular rhythm.     Heart sounds: No murmur. No friction rub. No gallop.   Pulmonary:     Breath sounds: No stridor. No wheezing or rales.  Chest:     Chest wall: Tenderness present.  Abdominal:     General: There is no distension.     Tenderness: There is no abdominal tenderness. There is no rebound.  Musculoskeletal: Normal range of motion.  Lymphadenopathy:     Cervical: No cervical adenopathy.  Skin:    Findings: No erythema or rash.  Neurological:     Mental Status: She is oriented to person, place, and time.     Motor: No abnormal muscle tone.     Coordination: Coordination normal.  Psychiatric:        Behavior: Behavior normal.      ED Treatments / Results  Labs (all labs ordered are listed, but only abnormal results are displayed) Labs  Reviewed - No data to display  EKG None  Radiology Dg Ribs Unilateral W/chest Right  Result Date: 12/24/2018 CLINICAL DATA:  Multiple falls. Right lower posterior rib pain. EXAM: RIGHT RIBS AND CHEST - 3+ VIEW COMPARISON:  None. FINDINGS: Minimally displaced right posterolateral sixth and eighth rib fractures. No evidence of pneumothorax. Soft tissues are grossly normal. IMPRESSION: 1. Minimally displaced right posterolateral sixth and eighth rib fractures. 2. No evidence of pneumothorax. Electronically Signed   By: Fidela Salisbury M.D.   On: 12/24/2018 21:19    Procedures Procedures (including critical care time)  Medications  Ordered in ED Medications - No data to display   Initial Impression / Assessment and Plan / ED Course  I have reviewed the triage vital signs and the nursing notes.  Pertinent labs & imaging results that were available during my care of the patient were reviewed by me and considered in my medical decision making (see chart for details).   X-ray shows 2 broken ribs.  Patient will be sent home with pain medicine and follow-up with her PCP.  Patient has no pneumo      Final Clinical Impressions(s) / ED Diagnoses   Final diagnoses:  Fall, initial encounter    ED Discharge Orders         Ordered    HYDROcodone-acetaminophen (NORCO/VICODIN) 5-325 MG tablet  Every 6 hours PRN,   Status:  Discontinued     12/24/18 2250    HYDROcodone-acetaminophen (NORCO/VICODIN) 5-325 MG tablet  Every 6 hours PRN,   Status:  Discontinued     12/24/18 2251    HYDROcodone-acetaminophen (NORCO/VICODIN) 5-325 MG tablet  Every 6 hours PRN     12/24/18 2251           Milton Ferguson, MD 12/24/18 2304

## 2019-01-09 ENCOUNTER — Ambulatory Visit (INDEPENDENT_AMBULATORY_CARE_PROVIDER_SITE_OTHER): Payer: PRIVATE HEALTH INSURANCE | Admitting: *Deleted

## 2019-01-09 DIAGNOSIS — J309 Allergic rhinitis, unspecified: Secondary | ICD-10-CM | POA: Diagnosis not present

## 2019-01-18 ENCOUNTER — Ambulatory Visit (INDEPENDENT_AMBULATORY_CARE_PROVIDER_SITE_OTHER): Payer: PRIVATE HEALTH INSURANCE

## 2019-01-18 DIAGNOSIS — J309 Allergic rhinitis, unspecified: Secondary | ICD-10-CM | POA: Diagnosis not present

## 2019-01-30 ENCOUNTER — Ambulatory Visit (INDEPENDENT_AMBULATORY_CARE_PROVIDER_SITE_OTHER): Payer: PRIVATE HEALTH INSURANCE

## 2019-01-30 DIAGNOSIS — J309 Allergic rhinitis, unspecified: Secondary | ICD-10-CM | POA: Diagnosis not present

## 2019-02-15 ENCOUNTER — Ambulatory Visit (INDEPENDENT_AMBULATORY_CARE_PROVIDER_SITE_OTHER): Payer: PRIVATE HEALTH INSURANCE

## 2019-02-15 DIAGNOSIS — J309 Allergic rhinitis, unspecified: Secondary | ICD-10-CM | POA: Diagnosis not present

## 2019-02-19 ENCOUNTER — Telehealth: Payer: Self-pay

## 2019-02-19 NOTE — Telephone Encounter (Signed)
I called and left a voicemail for the patients daughter regarding the patients [facility] EXP on 02/28/2019. The patient is also due for a follow up visit. This can either be in person or virtual.

## 2019-02-19 NOTE — Telephone Encounter (Signed)
Correction exp 04-04-2019

## 2019-03-13 ENCOUNTER — Ambulatory Visit (INDEPENDENT_AMBULATORY_CARE_PROVIDER_SITE_OTHER): Payer: PRIVATE HEALTH INSURANCE

## 2019-03-13 DIAGNOSIS — J309 Allergic rhinitis, unspecified: Secondary | ICD-10-CM

## 2019-03-20 ENCOUNTER — Ambulatory Visit (INDEPENDENT_AMBULATORY_CARE_PROVIDER_SITE_OTHER): Payer: PRIVATE HEALTH INSURANCE

## 2019-03-20 DIAGNOSIS — J309 Allergic rhinitis, unspecified: Secondary | ICD-10-CM

## 2019-03-26 NOTE — Telephone Encounter (Signed)
I have faxed a new referral request to [facility].

## 2019-03-27 ENCOUNTER — Ambulatory Visit (INDEPENDENT_AMBULATORY_CARE_PROVIDER_SITE_OTHER): Payer: PRIVATE HEALTH INSURANCE | Admitting: Allergy & Immunology

## 2019-03-27 ENCOUNTER — Encounter: Payer: Self-pay | Admitting: Allergy & Immunology

## 2019-03-27 ENCOUNTER — Other Ambulatory Visit: Payer: Self-pay

## 2019-03-27 VITALS — BP 126/64 | HR 86 | Temp 97.8°F | Resp 20

## 2019-03-27 DIAGNOSIS — J4520 Mild intermittent asthma, uncomplicated: Secondary | ICD-10-CM | POA: Diagnosis not present

## 2019-03-27 DIAGNOSIS — J3089 Other allergic rhinitis: Secondary | ICD-10-CM | POA: Diagnosis not present

## 2019-03-27 DIAGNOSIS — J302 Other seasonal allergic rhinitis: Secondary | ICD-10-CM

## 2019-03-27 DIAGNOSIS — J309 Allergic rhinitis, unspecified: Secondary | ICD-10-CM

## 2019-03-27 NOTE — Patient Instructions (Addendum)
1. Seasonal and perennial allergic rhinitis - Continue with allergy shots at the same schedule. - Continue with Nasacort one spray per nostril daily (aim for the ear on each side). - Continue with Claritin 10mg  as needed. - Monitor the headache and ear pain. - We can send you to ENT if needed. - However, the exam today looks normal.   2. Return in about 1 year (around 03/26/2020). This can be an in-person, a virtual Webex or a telephone follow up visit.   Please inform us of any Emergency Department visits, hospitalizations, or changes in symptoms. Call us before going to the ED for breathing or allergy symptoms since we might be able to fit you in for a sick visit. Feel free to contact us anytime with any questions, problems, or concerns.  It was a pleasure to see you and your family again today!  Websites that have reliable patient information: 1. American Academy of Asthma, Allergy, and Immunology: www.aaaai.org 2. Food Allergy Research and Education (FARE): foodallergy.org 3. Mothers of Asthmatics: http://www.asthmacommunitynetwork.org 4. American College of Allergy, Asthma, and Immunology: www.acaai.org   COVID-19 Vaccine Information can be found at: ShippingScam.co.uk For questions related to vaccine distribution or appointments, please email [email or web address] [email or web address] or call [phone].     "Like" [place] on Facebook and Instagram for our latest updates!        Make sure you are registered to vote! If you have moved or changed any of your contact information, you will need to get this updated before voting!  In some cases, you MAY be able to register to vote online: [email or web address]    \

## 2019-03-27 NOTE — Progress Notes (Signed)
FOLLOW UP  Date of Service/Encounter:  03/27/19   Assessment:   Mild intermittent asthma without complication  Seasonal and perennial allergic rhinitis (trees, grass, weeds, ragweed, molds, dust mite, cat dog)  Plan/Recommendations:   1. Seasonal and perennial allergic rhinitis - Continue with allergy shots at the same schedule. - Continue with Nasacort one spray per nostril daily (aim for the ear on each side). - Continue with Claritin 10mg  as needed. - Monitor the headache and ear pain. - We can send you to ENT if needed. - However, the exam today looks normal.   2. Return in about 1 year (around 03/26/2020). This can be an in-person, a virtual Webex or a telephone follow up visit.  Subjective:   Christina Fry is a 79 y.o. female presenting today for follow up of  Chief Complaint  Patient presents with  . Allergic Rhinitis     Headaches and ear aches in right ear that started a couple of weeks ago     Christina Fry has a history of the following: Patient Active Problem List   Diagnosis Date Noted  . Seasonal and perennial allergic rhinitis 04/04/2018  . Mild intermittent asthma without complication [id number]    History obtained from: chart review and patient and daughter.  Christina Fry is a 79 y.o. female presenting for a follow up visit.  She was last seen in January 2020. At that time, they decided to transfer her care completely to [place].  We remixed her allergen immunotherapy based on her previous testing.  For her asthma, she was under good control with albuterol as needed.  In the interim, she has started getting allergy shots. She has had a some fits and starts because she has not been very compliant with coming for her injections due to some illnesses. But she has tolerated the advance without a problem.   Christina Fry is on allergen immunotherapy. She receives two injections. Immunotherapy script #1 contains trees, weeds, grasses, cat and dog. She currently receives 0.39mL of  the Christina Fry (1/10,000). Immunotherapy script #2 contains molds, dust mites and cockroach. She currently receives 0.73mL of the Christina Fry (1/10,000). She started shots February of 2020 and not yet reached maintenance.   She does report a 2-4 week history of a headache and right sided ear pain. She has had no drainage from the right ear. She denies fevers. Of note, she has had some vision changes, but this seems to be related more to looking at her computer for a long period of time. She does see an ophthalmologist. She has had no problems with ambulating above her baseline and she is not "acting crazy".   She is on albuterol as needed. She estimates that she has used it twice over the last 12 months. Christina Fry's asthma has been well controlled. She has not required rescue medication, experienced nocturnal awakenings due to lower respiratory symptoms, nor have activities of daily living been limited. She has required no Emergency Department or Urgent Care visits for her asthma. She has required zero courses of systemic steroids for asthma exacerbations since the last visit. ACT score today is 25, indicating excellent asthma symptom control.   Otherwise, there have been no changes to her past medical history, surgical history, family history, or social history.    Review of Systems  Constitutional: Negative.  Negative for chills, fever, malaise/fatigue and weight loss.  HENT: Negative.  Negative for congestion, ear discharge, ear pain, sinus pain and sore throat.   Eyes:  Negative for pain, discharge and redness.  Respiratory: Negative for cough, sputum production, shortness of breath and wheezing.   Cardiovascular: Negative.  Negative for chest pain and palpitations.  Gastrointestinal: Negative for abdominal pain, constipation, diarrhea, heartburn, nausea and vomiting.  Skin: Negative.  Negative for itching and rash.  Neurological: Negative for dizziness and headaches.  Endo/Heme/Allergies: Negative for  environmental allergies. Does not bruise/bleed easily.       Objective:   Blood pressure 126/64, pulse 86, temperature 97.8 F (36.6 C), temperature source Temporal, resp. rate 20, SpO2 96 %. There is no height or weight on file to calculate BMI.   Physical Exam:  Physical Exam  Constitutional: She appears well-developed.  Talkative. Pleasant female.   HENT:  Head: Normocephalic and atraumatic.  Right Ear: Tympanic membrane, external ear and ear canal normal.  Left Ear: Tympanic membrane, external ear and ear canal normal.  Nose: Mucosal edema and rhinorrhea present. No nasal deformity or septal deviation. No epistaxis. Right sinus exhibits no maxillary sinus tenderness and no frontal sinus tenderness. Left sinus exhibits no maxillary sinus tenderness and no frontal sinus tenderness.  Mouth/Throat: Uvula is midline and oropharynx is clear and moist. Mucous membranes are not pale and not dry.  Cobblestoning present. Tonsils unremarkable.   Eyes: Pupils are equal, round, and reactive to light. Conjunctivae and EOM are normal. Right eye exhibits no chemosis and no discharge. Left eye exhibits no chemosis and no discharge. Right conjunctiva is not injected. Left conjunctiva is not injected.  Cardiovascular: Normal rate, regular rhythm and normal heart sounds.  Respiratory: Effort normal and breath sounds normal. No accessory muscle usage. No tachypnea. No respiratory distress. She has no wheezes. She has no rhonchi. She has no rales. She exhibits no tenderness.  Moving air well in all lung fields.  No increased work of breathing.  Lymphadenopathy:    She has no cervical adenopathy.  Neurological: She is alert.  Skin: No abrasion, no petechiae and no rash noted. Rash is not papular, not vesicular and not urticarial. No erythema. No pallor.  Psychiatric: She has a normal mood and affect.     Diagnostic studies: none      Salvatore Marvel, MD  [facility]

## 2019-04-05 ENCOUNTER — Ambulatory Visit (INDEPENDENT_AMBULATORY_CARE_PROVIDER_SITE_OTHER): Payer: No Typology Code available for payment source

## 2019-04-05 DIAGNOSIS — J4520 Mild intermittent asthma, uncomplicated: Secondary | ICD-10-CM

## 2019-04-17 ENCOUNTER — Ambulatory Visit (INDEPENDENT_AMBULATORY_CARE_PROVIDER_SITE_OTHER): Payer: No Typology Code available for payment source

## 2019-04-17 DIAGNOSIS — J309 Allergic rhinitis, unspecified: Secondary | ICD-10-CM

## 2019-04-24 ENCOUNTER — Ambulatory Visit (INDEPENDENT_AMBULATORY_CARE_PROVIDER_SITE_OTHER): Payer: No Typology Code available for payment source

## 2019-04-24 DIAGNOSIS — J309 Allergic rhinitis, unspecified: Secondary | ICD-10-CM

## 2019-05-01 ENCOUNTER — Ambulatory Visit (INDEPENDENT_AMBULATORY_CARE_PROVIDER_SITE_OTHER): Payer: No Typology Code available for payment source

## 2019-05-01 DIAGNOSIS — J309 Allergic rhinitis, unspecified: Secondary | ICD-10-CM | POA: Diagnosis not present

## 2019-05-06 DIAGNOSIS — J301 Allergic rhinitis due to pollen: Secondary | ICD-10-CM | POA: Diagnosis not present

## 2019-05-06 NOTE — Progress Notes (Signed)
VIALS EXP 05-05-20

## 2019-05-07 DIAGNOSIS — J3089 Other allergic rhinitis: Secondary | ICD-10-CM | POA: Diagnosis not present

## 2019-05-15 ENCOUNTER — Ambulatory Visit (INDEPENDENT_AMBULATORY_CARE_PROVIDER_SITE_OTHER): Payer: No Typology Code available for payment source

## 2019-05-15 DIAGNOSIS — J309 Allergic rhinitis, unspecified: Secondary | ICD-10-CM

## 2019-05-22 ENCOUNTER — Ambulatory Visit (INDEPENDENT_AMBULATORY_CARE_PROVIDER_SITE_OTHER): Payer: No Typology Code available for payment source

## 2019-05-22 DIAGNOSIS — J309 Allergic rhinitis, unspecified: Secondary | ICD-10-CM

## 2019-06-05 ENCOUNTER — Ambulatory Visit (INDEPENDENT_AMBULATORY_CARE_PROVIDER_SITE_OTHER): Payer: No Typology Code available for payment source

## 2019-06-05 DIAGNOSIS — J309 Allergic rhinitis, unspecified: Secondary | ICD-10-CM | POA: Diagnosis not present

## 2019-06-19 ENCOUNTER — Ambulatory Visit (INDEPENDENT_AMBULATORY_CARE_PROVIDER_SITE_OTHER): Payer: No Typology Code available for payment source

## 2019-06-19 DIAGNOSIS — J309 Allergic rhinitis, unspecified: Secondary | ICD-10-CM

## 2019-06-26 ENCOUNTER — Ambulatory Visit (INDEPENDENT_AMBULATORY_CARE_PROVIDER_SITE_OTHER): Payer: No Typology Code available for payment source

## 2019-06-26 DIAGNOSIS — J309 Allergic rhinitis, unspecified: Secondary | ICD-10-CM

## 2019-07-10 ENCOUNTER — Ambulatory Visit (INDEPENDENT_AMBULATORY_CARE_PROVIDER_SITE_OTHER): Payer: No Typology Code available for payment source

## 2019-07-10 DIAGNOSIS — J309 Allergic rhinitis, unspecified: Secondary | ICD-10-CM | POA: Diagnosis not present

## 2019-07-24 ENCOUNTER — Ambulatory Visit (INDEPENDENT_AMBULATORY_CARE_PROVIDER_SITE_OTHER): Payer: No Typology Code available for payment source

## 2019-07-24 DIAGNOSIS — J309 Allergic rhinitis, unspecified: Secondary | ICD-10-CM

## 2019-08-02 ENCOUNTER — Ambulatory Visit (INDEPENDENT_AMBULATORY_CARE_PROVIDER_SITE_OTHER): Payer: No Typology Code available for payment source

## 2019-08-02 DIAGNOSIS — J309 Allergic rhinitis, unspecified: Secondary | ICD-10-CM | POA: Diagnosis not present

## 2019-08-07 ENCOUNTER — Ambulatory Visit (INDEPENDENT_AMBULATORY_CARE_PROVIDER_SITE_OTHER): Payer: No Typology Code available for payment source

## 2019-08-07 DIAGNOSIS — J309 Allergic rhinitis, unspecified: Secondary | ICD-10-CM

## 2019-08-21 ENCOUNTER — Ambulatory Visit (INDEPENDENT_AMBULATORY_CARE_PROVIDER_SITE_OTHER): Payer: No Typology Code available for payment source

## 2019-08-21 DIAGNOSIS — J309 Allergic rhinitis, unspecified: Secondary | ICD-10-CM

## 2019-09-04 ENCOUNTER — Ambulatory Visit (INDEPENDENT_AMBULATORY_CARE_PROVIDER_SITE_OTHER): Payer: No Typology Code available for payment source

## 2019-09-04 DIAGNOSIS — J309 Allergic rhinitis, unspecified: Secondary | ICD-10-CM

## 2019-09-11 ENCOUNTER — Ambulatory Visit (INDEPENDENT_AMBULATORY_CARE_PROVIDER_SITE_OTHER): Payer: No Typology Code available for payment source

## 2019-09-11 DIAGNOSIS — J309 Allergic rhinitis, unspecified: Secondary | ICD-10-CM | POA: Diagnosis not present

## 2019-09-20 ENCOUNTER — Ambulatory Visit (INDEPENDENT_AMBULATORY_CARE_PROVIDER_SITE_OTHER): Payer: No Typology Code available for payment source

## 2019-09-20 DIAGNOSIS — J309 Allergic rhinitis, unspecified: Secondary | ICD-10-CM | POA: Diagnosis not present

## 2019-09-25 ENCOUNTER — Ambulatory Visit (INDEPENDENT_AMBULATORY_CARE_PROVIDER_SITE_OTHER): Payer: No Typology Code available for payment source

## 2019-09-25 DIAGNOSIS — J309 Allergic rhinitis, unspecified: Secondary | ICD-10-CM

## 2019-10-02 ENCOUNTER — Other Ambulatory Visit: Payer: Self-pay

## 2019-10-02 ENCOUNTER — Ambulatory Visit (INDEPENDENT_AMBULATORY_CARE_PROVIDER_SITE_OTHER): Payer: No Typology Code available for payment source

## 2019-10-02 DIAGNOSIS — J309 Allergic rhinitis, unspecified: Secondary | ICD-10-CM

## 2019-10-02 MED ORDER — EPINEPHRINE 0.3 MG/0.3ML IJ SOAJ: 0.3000 mg | Freq: Once | INTRAMUSCULAR | 1 refills | Status: AC

## 2019-10-09 ENCOUNTER — Ambulatory Visit (INDEPENDENT_AMBULATORY_CARE_PROVIDER_SITE_OTHER): Payer: No Typology Code available for payment source

## 2019-10-09 DIAGNOSIS — J309 Allergic rhinitis, unspecified: Secondary | ICD-10-CM | POA: Diagnosis not present

## 2019-10-18 ENCOUNTER — Ambulatory Visit (INDEPENDENT_AMBULATORY_CARE_PROVIDER_SITE_OTHER): Payer: No Typology Code available for payment source

## 2019-10-18 DIAGNOSIS — J309 Allergic rhinitis, unspecified: Secondary | ICD-10-CM | POA: Diagnosis not present

## 2019-11-06 ENCOUNTER — Ambulatory Visit (INDEPENDENT_AMBULATORY_CARE_PROVIDER_SITE_OTHER): Payer: No Typology Code available for payment source

## 2019-11-06 DIAGNOSIS — J309 Allergic rhinitis, unspecified: Secondary | ICD-10-CM | POA: Diagnosis not present

## 2019-11-15 ENCOUNTER — Ambulatory Visit (INDEPENDENT_AMBULATORY_CARE_PROVIDER_SITE_OTHER): Payer: No Typology Code available for payment source

## 2019-11-15 DIAGNOSIS — J309 Allergic rhinitis, unspecified: Secondary | ICD-10-CM | POA: Diagnosis not present

## 2019-11-22 ENCOUNTER — Encounter (HOSPITAL_COMMUNITY): Payer: Self-pay

## 2019-11-22 ENCOUNTER — Inpatient Hospital Stay (HOSPITAL_COMMUNITY)
Admission: EM | Admit: 2019-11-22 | Discharge: 2019-12-10 | DRG: 546 | Disposition: A | Discharge status: Skilled Nursing Facility | Payer: No Typology Code available for payment source | Attending: Internal Medicine | Admitting: Internal Medicine

## 2019-11-22 ENCOUNTER — Ambulatory Visit (INDEPENDENT_AMBULATORY_CARE_PROVIDER_SITE_OTHER)
Admission: EM | Admit: 2019-11-22 | Discharge: 2019-11-22 | Disposition: A | Discharge status: Home / Self Care | Payer: No Typology Code available for payment source | Source: Home / Self Care

## 2019-11-22 ENCOUNTER — Encounter: Payer: Self-pay | Admitting: Emergency Medicine

## 2019-11-22 ENCOUNTER — Ambulatory Visit: Payer: Self-pay

## 2019-11-22 ENCOUNTER — Emergency Department (HOSPITAL_COMMUNITY): Payer: No Typology Code available for payment source

## 2019-11-22 ENCOUNTER — Other Ambulatory Visit: Payer: Self-pay

## 2019-11-22 DIAGNOSIS — F419 Anxiety disorder, unspecified: Secondary | ICD-10-CM | POA: Diagnosis present

## 2019-11-22 DIAGNOSIS — Z90710 Acquired absence of both cervix and uterus: Secondary | ICD-10-CM

## 2019-11-22 DIAGNOSIS — E1140 Type 2 diabetes mellitus with diabetic neuropathy, unspecified: Secondary | ICD-10-CM | POA: Diagnosis present

## 2019-11-22 DIAGNOSIS — H538 Other visual disturbances: Secondary | ICD-10-CM | POA: Diagnosis present

## 2019-11-22 DIAGNOSIS — Z91041 Radiographic dye allergy status: Secondary | ICD-10-CM

## 2019-11-22 DIAGNOSIS — R509 Fever, unspecified: Secondary | ICD-10-CM | POA: Diagnosis present

## 2019-11-22 DIAGNOSIS — Z23 Encounter for immunization: Secondary | ICD-10-CM

## 2019-11-22 DIAGNOSIS — I10 Essential (primary) hypertension: Secondary | ICD-10-CM | POA: Diagnosis present

## 2019-11-22 DIAGNOSIS — R531 Weakness: Secondary | ICD-10-CM | POA: Diagnosis not present

## 2019-11-22 DIAGNOSIS — W19XXXA Unspecified fall, initial encounter: Secondary | ICD-10-CM

## 2019-11-22 DIAGNOSIS — Z881 Allergy status to other antibiotic agents status: Secondary | ICD-10-CM

## 2019-11-22 DIAGNOSIS — R609 Edema, unspecified: Secondary | ICD-10-CM

## 2019-11-22 DIAGNOSIS — G459 Transient cerebral ischemic attack, unspecified: Secondary | ICD-10-CM | POA: Diagnosis present

## 2019-11-22 DIAGNOSIS — W1830XA Fall on same level, unspecified, initial encounter: Secondary | ICD-10-CM | POA: Diagnosis present

## 2019-11-22 DIAGNOSIS — I639 Cerebral infarction, unspecified: Secondary | ICD-10-CM

## 2019-11-22 DIAGNOSIS — M353 Polymyalgia rheumatica: Principal | ICD-10-CM | POA: Diagnosis present

## 2019-11-22 DIAGNOSIS — M797 Fibromyalgia: Secondary | ICD-10-CM

## 2019-11-22 DIAGNOSIS — M4854XA Collapsed vertebra, not elsewhere classified, thoracic region, initial encounter for fracture: Secondary | ICD-10-CM | POA: Diagnosis present

## 2019-11-22 DIAGNOSIS — E860 Dehydration: Secondary | ICD-10-CM | POA: Diagnosis present

## 2019-11-22 DIAGNOSIS — Z20822 Contact with and (suspected) exposure to covid-19: Secondary | ICD-10-CM | POA: Diagnosis present

## 2019-11-22 DIAGNOSIS — B009 Herpesviral infection, unspecified: Secondary | ICD-10-CM | POA: Diagnosis present

## 2019-11-22 DIAGNOSIS — H547 Unspecified visual loss: Secondary | ICD-10-CM | POA: Diagnosis present

## 2019-11-22 DIAGNOSIS — Z79890 Hormone replacement therapy (postmenopausal): Secondary | ICD-10-CM

## 2019-11-22 DIAGNOSIS — Z79899 Other long term (current) drug therapy: Secondary | ICD-10-CM

## 2019-11-22 DIAGNOSIS — R262 Difficulty in walking, not elsewhere classified: Secondary | ICD-10-CM

## 2019-11-22 DIAGNOSIS — I351 Nonrheumatic aortic (valve) insufficiency: Secondary | ICD-10-CM | POA: Diagnosis present

## 2019-11-22 DIAGNOSIS — J42 Unspecified chronic bronchitis: Secondary | ICD-10-CM | POA: Diagnosis present

## 2019-11-22 DIAGNOSIS — E1165 Type 2 diabetes mellitus with hyperglycemia: Secondary | ICD-10-CM | POA: Diagnosis not present

## 2019-11-22 DIAGNOSIS — K219 Gastro-esophageal reflux disease without esophagitis: Secondary | ICD-10-CM

## 2019-11-22 DIAGNOSIS — R54 Age-related physical debility: Secondary | ICD-10-CM | POA: Diagnosis present

## 2019-11-22 DIAGNOSIS — Z885 Allergy status to narcotic agent status: Secondary | ICD-10-CM

## 2019-11-22 DIAGNOSIS — J4520 Mild intermittent asthma, uncomplicated: Secondary | ICD-10-CM | POA: Diagnosis present

## 2019-11-22 DIAGNOSIS — R29898 Other symptoms and signs involving the musculoskeletal system: Secondary | ICD-10-CM | POA: Diagnosis present

## 2019-11-22 DIAGNOSIS — T380X5A Adverse effect of glucocorticoids and synthetic analogues, initial encounter: Secondary | ICD-10-CM

## 2019-11-22 DIAGNOSIS — E785 Hyperlipidemia, unspecified: Secondary | ICD-10-CM | POA: Diagnosis present

## 2019-11-22 DIAGNOSIS — E0500 Thyrotoxicosis with diffuse goiter without thyrotoxic crisis or storm: Secondary | ICD-10-CM | POA: Diagnosis present

## 2019-11-22 DIAGNOSIS — G8929 Other chronic pain: Secondary | ICD-10-CM | POA: Diagnosis present

## 2019-11-22 DIAGNOSIS — Y92009 Unspecified place in unspecified non-institutional (private) residence as the place of occurrence of the external cause: Secondary | ICD-10-CM

## 2019-11-22 DIAGNOSIS — R251 Tremor, unspecified: Secondary | ICD-10-CM | POA: Diagnosis present

## 2019-11-22 DIAGNOSIS — E039 Hypothyroidism, unspecified: Secondary | ICD-10-CM

## 2019-11-22 LAB — CBC
HCT: 41.1 % (ref 36.0–46.0)
Hemoglobin: 13 g/dL (ref 12.0–15.0)
MCH: 30.8 pg (ref 26.0–34.0)
MCHC: 31.6 g/dL (ref 30.0–36.0)
MCV: 97.4 fL (ref 80.0–100.0)
Platelets: 348 10*3/uL (ref 150–400)
RBC: 4.22 MIL/uL (ref 3.87–5.11)
RDW: 14.6 % (ref 11.5–15.5)
WBC: 10.4 10*3/uL (ref 4.0–10.5)
nRBC: 0 % (ref 0.0–0.2)

## 2019-11-22 LAB — POCT URINALYSIS DIP (MANUAL ENTRY)
Blood, UA: NEGATIVE
Glucose, UA: NEGATIVE mg/dL
Leukocytes, UA: NEGATIVE
Nitrite, UA: NEGATIVE
Protein Ur, POC: 30 mg/dL — AB
Spec Grav, UA: 1.02 (ref 1.010–1.025)
Urobilinogen, UA: 0.2 E.U./dL
pH, UA: 5.5 (ref 5.0–8.0)

## 2019-11-22 LAB — BASIC METABOLIC PANEL
Anion gap: 10 (ref 5–15)
BUN: 14 mg/dL (ref 8–23)
CO2: 26 mmol/L (ref 22–32)
Calcium: 9.6 mg/dL (ref 8.9–10.3)
Chloride: 101 mmol/L (ref 98–111)
Creatinine, Ser: 1.09 mg/dL — ABNORMAL HIGH (ref 0.44–1.00)
GFR calc Af Amer: 55 mL/min — ABNORMAL LOW (ref >60)
GFR calc non Af Amer: 48 mL/min — ABNORMAL LOW (ref >60)
Glucose, Bld: 109 mg/dL — ABNORMAL HIGH (ref 70–99)
Potassium: 3.8 mmol/L (ref 3.5–5.1)
Sodium: 137 mmol/L (ref 135–145)

## 2019-11-22 IMAGING — CT CT HEAD W/O CM
3 series · 16 of 47 positions shown, 19 images · non-contrast
Comparison: None.

CLINICAL DATA: Generalized weakness.

EXAM:
CT HEAD WITHOUT CONTRAST
TECHNIQUE: Contiguous axial images were obtained from the base of the skull
through the vertex without intravenous contrast.

[Series 2: head w o · axial · 0.42mm/px · z∈[+1213,+1343]mm · 10 of 32 slices shown, 13 images]
[im 3/32  brain]
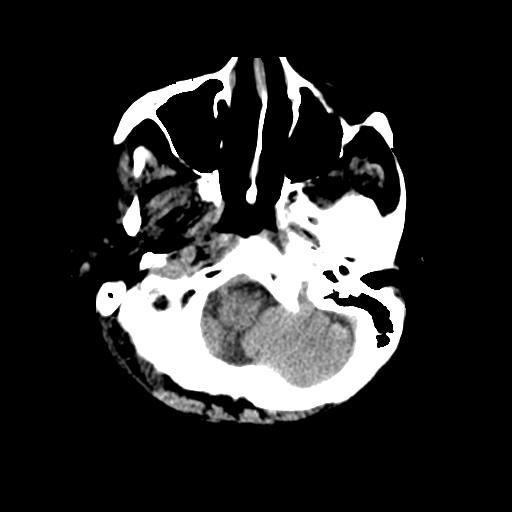
[im 3/32  bone]
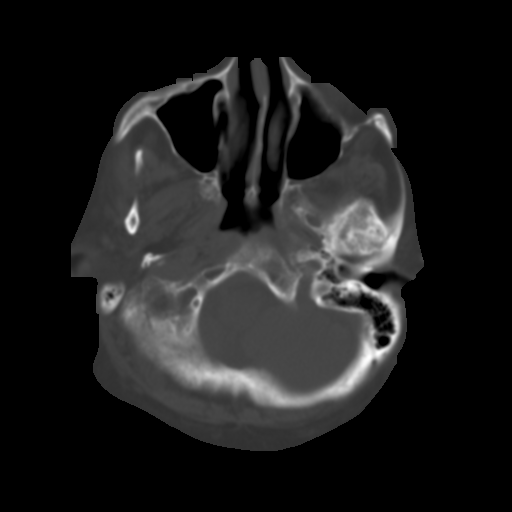
[im 6/32  brain]
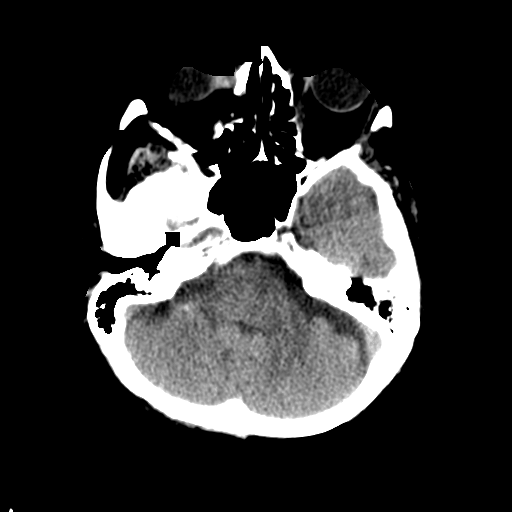
[im 9/32  brain]
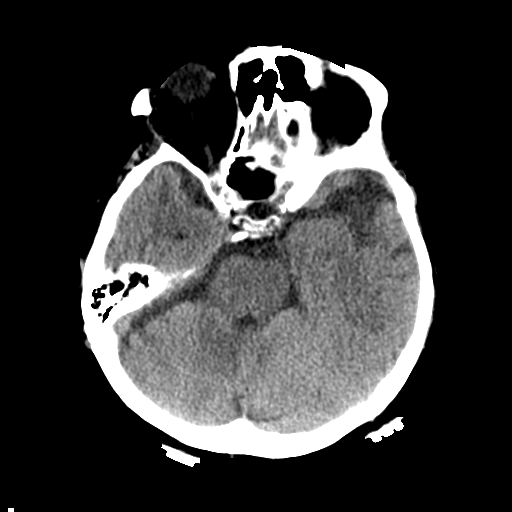
[im 11/32  brain]
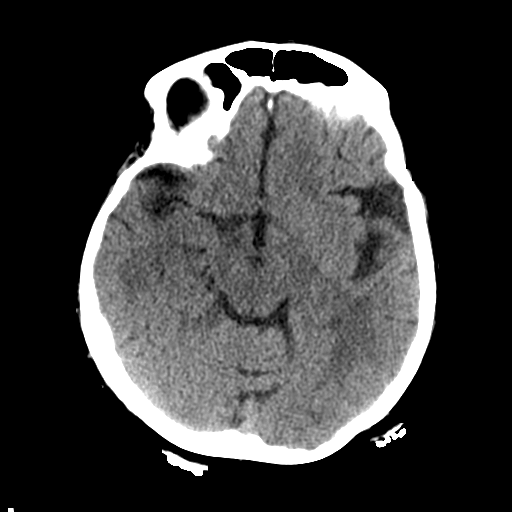
[im 14/32  brain]
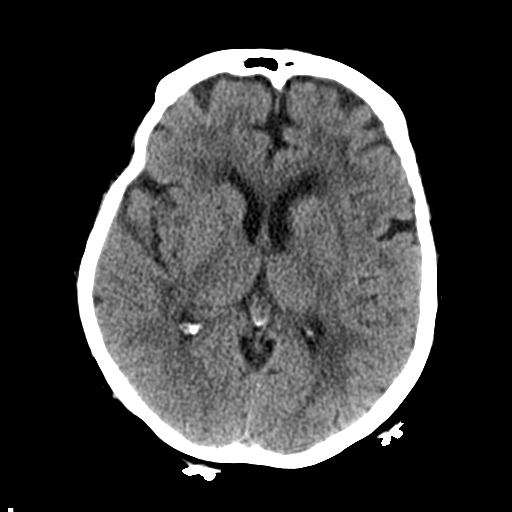
[im 14/32  bone]
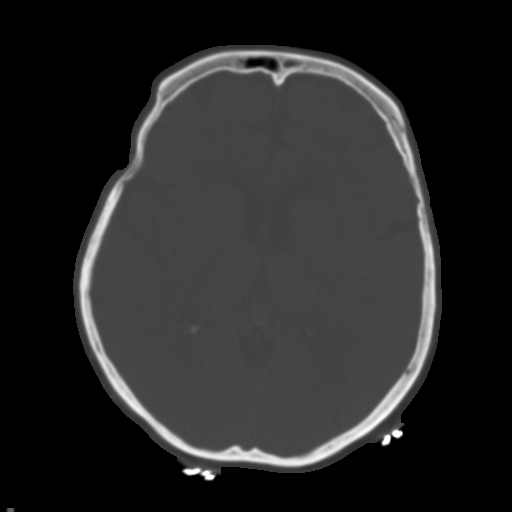
[im 18/32  brain]
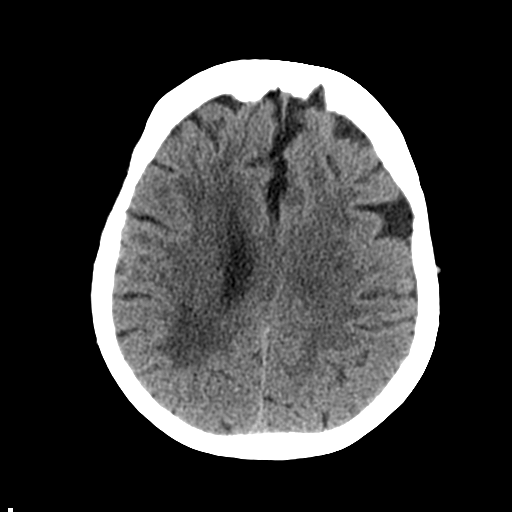
[im 21/32  brain]
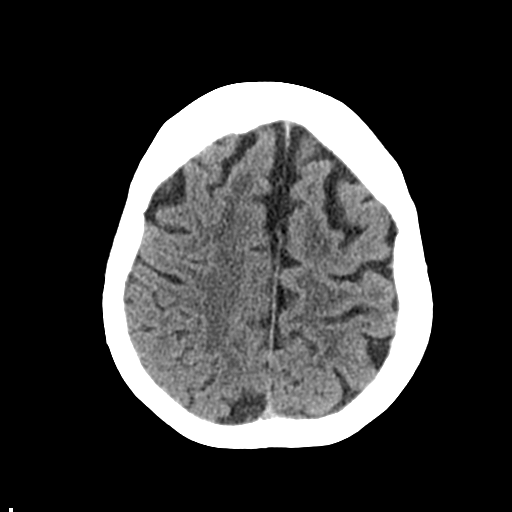
[im 24/32  brain]
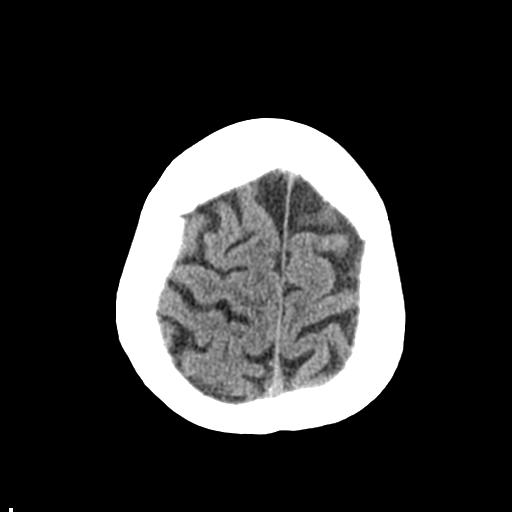
[im 26/32  brain]
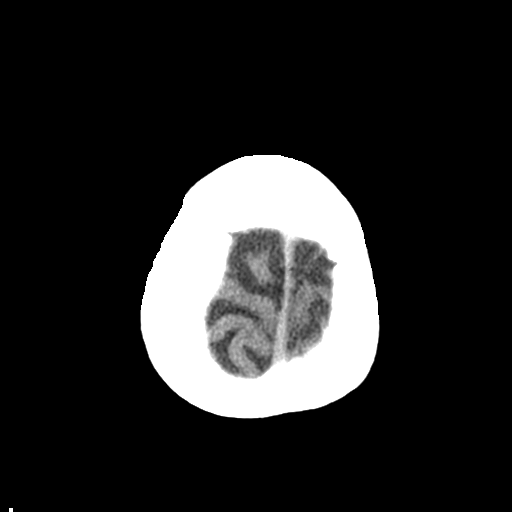
[im 26/32  bone]
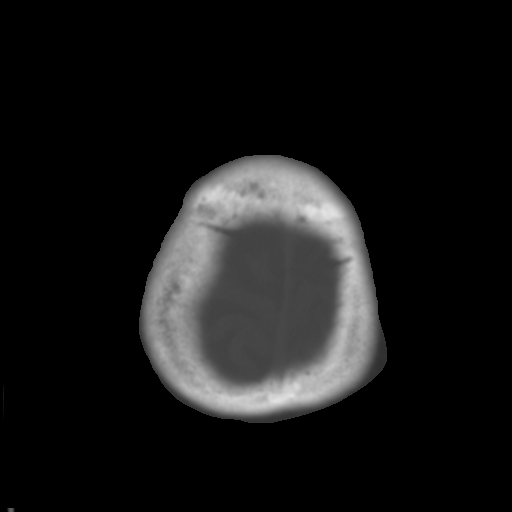
[im 29/32  brain]
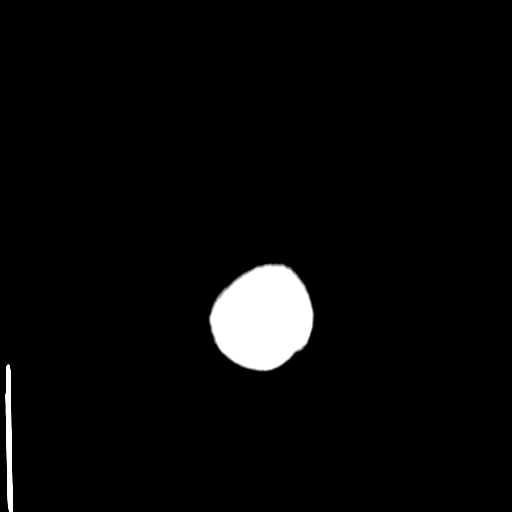

[Series 4: coronal soft · coronal · 0.30mm/px · 3 of 66 slices shown]
[im 22/66  brain]
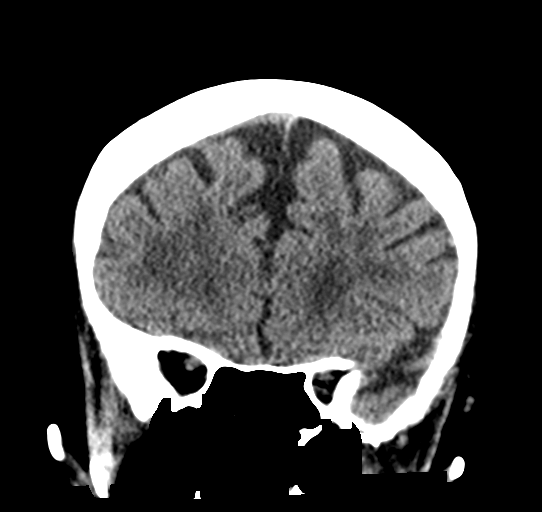
[im 29/66  brain]
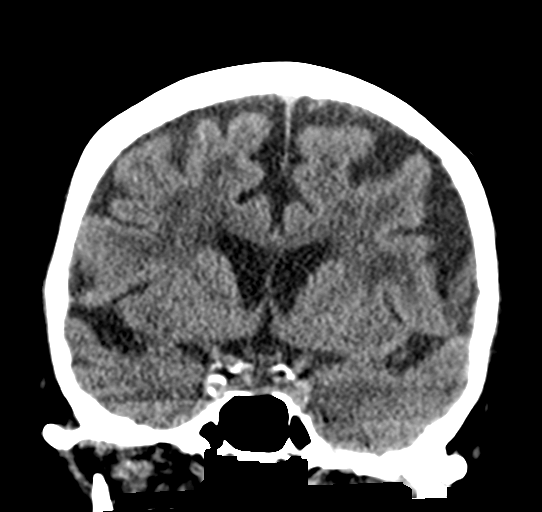
[im 37/66  brain]
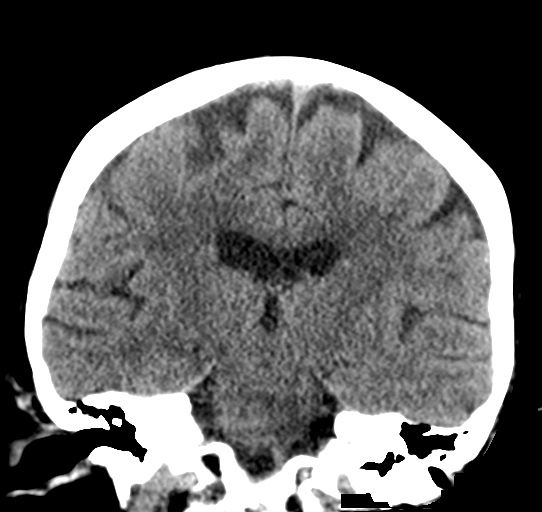

[Series 5: sagittal soft · sagittal · 0.33mm/px · 3 of 55 slices shown]
[im 19/55  brain]
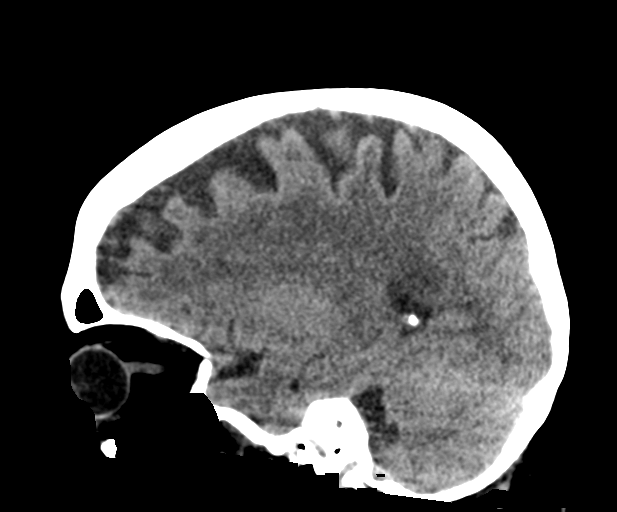
[im 28/55  brain]
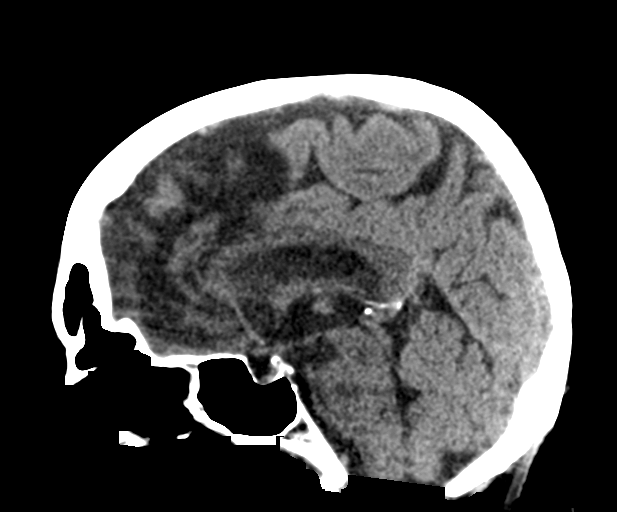
[im 37/55  brain]
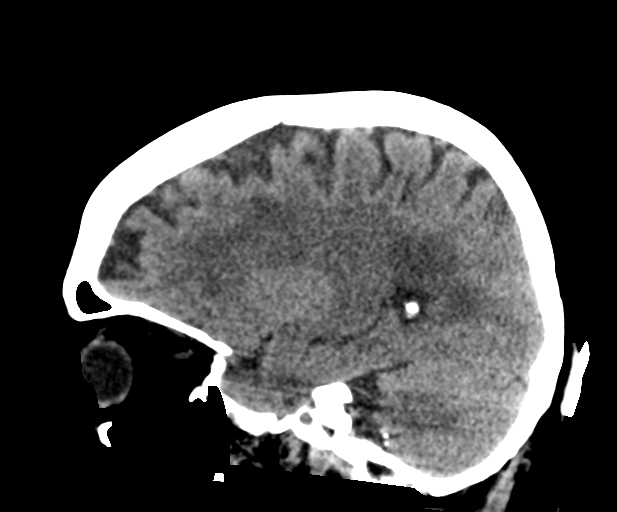

[16 of 47 positions shown; findings below may reference images not displayed]

FINDINGS: Brain: There is mild cerebral atrophy with widening of the
extra-axial spaces and ventricular dilatation.
There are areas of decreased attenuation within the white matter
tracts of the supratentorial brain, consistent with microvascular
disease changes.

Vascular: No hyperdense vessel or unexpected calcification.

Skull: Negative for acute fracture or focal lesion.

Sinuses/Orbits: Chronic deformities are seen along the medial walls
of the bilateral orbits. The paranasal sinuses are otherwise normal
in appearance.

Other: None.
IMPRESSION: 1. Generalized cerebral atrophy.
2. No acute intracranial abnormality.

## 2019-11-22 MED ORDER — SODIUM CHLORIDE 0.9 % IV BOLUS
500.0000 mL | Freq: Once | INTRAVENOUS | Status: AC
  Administered 2019-11-23: 500 mL via INTRAVENOUS

## 2019-11-22 NOTE — ED Triage Notes (Signed)
Pt reports uncontrollable shaking throughout body x 1 week. Also reports burning with urination.

## 2019-11-22 NOTE — ED Notes (Signed)
`  pt reports 8/19 went to [place] "because I was in trouble"  Dx Constipation and was given meds to clear   Today went to urgent care for "trembling all over" and "the doctor told me I was in real trouble and I needed more than they could provide"    [place] home and planned to go to [place] ambulating with her walker and walker "fell over and I crashed to the floor"  Hit head- no LOC  L shoulder   And continues to "tremble all over" per pt

## 2019-11-22 NOTE — ED Notes (Signed)
Pt in CT at this time.

## 2019-11-22 NOTE — ED Provider Notes (Signed)
Patient would like all her labs to be done at the urgent care and believe she will be able to get her results right away.  She was told result will be available within 2 to 4 days.  Patient declined services and states she will go to the [facility] ER tomorrow.   Emerson Monte, FNP 11/22/19 1534

## 2019-11-22 NOTE — ED Triage Notes (Signed)
Pt brought to ED via [facility] for generalized weakness. Pt states she was seen previously in the [place] ED for constipation. [facility]. Pt states she has gotten worse over the last 3 days with weakness.

## 2019-11-22 NOTE — ED Provider Notes (Signed)
[facility] EMERGENCY DEPARTMENT Provider Note   CSN: [id number] Arrival date & time: 11/22/19  1816     History Chief Complaint  Patient presents with  . Weakness    Christina Fry is a 79 y.o. female.  Patient is an 79 year old female with past medical history of diabetes, PMR, fibromyalgia, hyperlipidemia, Graves' disease.  She presents today for evaluation of weakness.  Patient reports feeling increasingly shaky over the past 2 days.  This morning the shakiness became much worse to the point she was having difficulty ambulating.  She describes weakness in her legs and actually fell this afternoon.  She was seen at urgent care earlier today where laboratory studies were drawn, but does not know the results.  She was to see her primary doctor at the [facility] tomorrow, however after falling this evening after her urgent care appointment comes here to be evaluated.  She describes shakiness in her arms and legs as well as pain throughout the muscles of her shoulders and thighs.  She denies any aggravating or alleviating factors.  She denies any other specific complaints.  The history is provided by the patient.       Past Medical History:  Diagnosis Date  . Anxiety   . Cancer ([place])   . Diabetes mellitus without complication ([place])   . Fibromyalgia   . GERD (gastroesophageal reflux disease)   . Grave's disease   . Herpes   . Hyperlipidemia   . Mild intermittent asthma without complication 0/12/2723  . Palpitation   . Polymyalgia rheumatica [facility])     Patient Active Problem List   Diagnosis Date Noted  . Seasonal and perennial allergic rhinitis 04/04/2018  . Mild intermittent asthma without complication 36/64/4034    Past Surgical History:  Procedure Laterality Date  . ABDOMINAL HYSTERECTOMY    . APPENDECTOMY    . CARPAL TUNNEL RELEASE    . HERNIA REPAIR    . INCISIONAL HERNIA REPAIR  03/20/2012   Procedure: LAPAROSCOPIC INCISIONAL HERNIA;  Surgeon: Jamesetta So, MD;   Location: [facility]S;  Service: General;  Laterality: N/A;  . INSERTION OF MESH  03/20/2012   Procedure: INSERTION OF MESH;  Surgeon: Jamesetta So, MD;  Location: [facility];  Service: General;  Laterality: N/A;  . TOTAL ABDOMINAL HYSTERECTOMY W/ BILATERAL SALPINGOOPHORECTOMY       OB History   No obstetric history on file.     Family History  Problem Relation Age of Onset  . Allergic rhinitis Neg Hx   . Asthma Neg Hx   . Eczema Neg Hx   . Urticaria Neg Hx   . Angioedema Neg Hx   . Atopy Neg Hx   . Immunodeficiency Neg Hx     Social History   Tobacco Use  . Smoking status: Never Smoker  . Smokeless tobacco: Never Used  Vaping Use  . Vaping Use: Never used  Substance Use Topics  . Alcohol use: No  . Drug use: No    Home Medications Prior to Admission medications   Medication Sig Start Date End Date Taking? Authorizing Provider  acetaminophen (TYLENOL) 325 MG tablet Take 650 mg by mouth every 6 (six) hours as needed.    [provider]  acyclovir (ZOVIRAX) 200 MG capsule Take 400 mg by mouth 2 (two) times daily.      [provider]  Calcium Carbonate-Vitamin D (CALCIUM 600+D HIGH POTENCY) 600-400 MG-UNIT per tablet Take 1 tablet by mouth daily.  [provider]  cholecalciferol (VITAMIN D) 400 UNITS TABS Take 400 Units by mouth daily.    [provider]  Docusate Sodium 100 MG capsule Take 100 mg by mouth 2 (two) times daily.    [provider]  fluticasone (FLONASE) 50 MCG/ACT nasal spray Place 1 spray into both nostrils 2 (two) times daily. 04/05/18   Valentina Shaggy, MD  gabapentin (NEURONTIN) 100 MG capsule Take 200 mg by mouth at bedtime.      [provider]  HYDROcodone-acetaminophen (NORCO/VICODIN) 5-325 MG tablet Take 1 tablet by mouth every 6 (six) hours as needed for moderate pain. 12/24/18   Milton Ferguson, MD  levothyroxine (SYNTHROID, LEVOTHROID) 88 MCG tablet Take 88 mcg by mouth daily before breakfast.     [provider]  loratadine (CLARITIN) 10 MG tablet Take 10 mg by mouth daily.    [provider]  metoprolol tartrate (LOPRESSOR) 25 MG tablet Take 25 mg by mouth 2 (two) times daily.      [provider]  nortriptyline (PAMELOR) 25 MG capsule Take 25 mg by mouth at bedtime.      [provider]  omeprazole (PRILOSEC) 20 MG capsule Take 40 mg by mouth 2 (two) times daily.      [provider]    Allergies    Iohexol, Codeine, Morphine and related, Nitrofuran derivatives, and Oxycodone-acetaminophen  Review of Systems   Review of Systems  All other systems reviewed and are negative.   Physical Exam Updated Vital Signs BP (!) 152/81 (BP Location: Right Arm)   Pulse 87   Temp 98.5 F (36.9 C) (Oral)   Resp 18   Ht 5\' 3"  (1.6 m)   Wt 73 kg   SpO2 100%   BMI 28.52 kg/m   Physical Exam Vitals and nursing note reviewed.  Constitutional:      General: She is not in acute distress.    Appearance: She is well-developed. She is not diaphoretic.  HENT:     Head: Normocephalic and atraumatic.  Cardiovascular:     Rate and Rhythm: Normal rate and regular rhythm.     Heart sounds: No murmur heard.  No friction rub. No gallop.   Pulmonary:     Effort: Pulmonary effort is normal. No respiratory distress.     Breath sounds: Normal breath sounds. No wheezing.  Abdominal:     General: Bowel sounds are normal. There is no distension.     Palpations: Abdomen is soft.     Tenderness: There is no abdominal tenderness.  Musculoskeletal:        General: Normal range of motion.     Cervical back: Normal range of motion and neck supple.  Skin:    General: Skin is warm and dry.  Neurological:     Mental Status: She is alert and oriented to person, place, and time.     Cranial Nerves: No cranial nerve deficit.     Motor: Weakness present.     Coordination: Coordination normal.     Comments: Patient has generalized decreased strength throughout  all extremities.  She is very shaky with movements and has difficulty completing finger-to-nose secondary to tremulousness.     ED Results / Procedures / Treatments   Labs (all labs ordered are listed, but only abnormal results are displayed) Labs Reviewed  BASIC METABOLIC PANEL - Abnormal; Notable for the following components:      Result Value   Glucose, Bld 109 (*)  Creatinine, Ser 1.09 (*)    GFR calc non Af Amer 48 (*)    GFR calc Af Amer 55 (*)    All other components within normal limits  CBC  URINALYSIS, ROUTINE W REFLEX MICROSCOPIC  CK  TSH  AMMONIA  HEPATIC FUNCTION PANEL  CBG MONITORING, ED  TROPONIN I (HIGH SENSITIVITY)    EKG EKG Interpretation  Date/Time:  Friday November 22 2019 18:41:27 EDT Ventricular Rate:  97 PR Interval:  132 QRS Duration: 88 QT Interval:  360 QTC Calculation: 457 R Axis:   42 Text Interpretation: Sinus rhythm with occasional Premature ventricular complexes ST & T wave abnormality, consider lateral ischemia Abnormal ECG Poor data quality Confirmed by Aletta Edouard [phone]) on 11/22/2019 6:53:15 PM   Radiology No results found.  Procedures Procedures (including critical care time)  Medications Ordered in ED Medications  sodium chloride 0.9 % bolus 500 mL (has no administration in time range)    ED Course  I have reviewed the triage vital signs and the nursing notes.  Pertinent labs & imaging results that were available during my care of the patient were reviewed by me and considered in my medical decision making (see chart for details).    MDM Rules/Calculators/A&P  Patient presenting here with a several day history of tremulousness and weakness in her legs.  She denies any specific aches or pains.  Patient arrives here with stable vital signs.  Physical examination reveals generalized weakness throughout all extremities.  She is noted to have tremor when she attempts finger-to-nose.  Patient's work-up is essentially  unremarkable.  There are no significant findings in her CBC, basic metabolic panel, TSH, cardiac work-up, chest x-ray, or head CT.  I am uncertain as to the etiology of her symptoms, however patient continues to have difficulty ambulating.  I attempted to walk her, however she was unable to bear her own weight.  At this point, I feel as though admission is indicated.  Patient may require consultation with neurology as I have not found a cause for her weakness.  Final Clinical Impression(s) / ED Diagnoses Final diagnoses:  None    Rx / DC Orders ED Discharge Orders    None       Veryl Speak, MD 11/23/19 [phone]

## 2019-11-23 ENCOUNTER — Inpatient Hospital Stay (HOSPITAL_COMMUNITY): Payer: No Typology Code available for payment source

## 2019-11-23 ENCOUNTER — Observation Stay (HOSPITAL_COMMUNITY): Payer: No Typology Code available for payment source

## 2019-11-23 DIAGNOSIS — E0500 Thyrotoxicosis with diffuse goiter without thyrotoxic crisis or storm: Secondary | ICD-10-CM | POA: Diagnosis present

## 2019-11-23 DIAGNOSIS — K219 Gastro-esophageal reflux disease without esophagitis: Secondary | ICD-10-CM

## 2019-11-23 DIAGNOSIS — J42 Unspecified chronic bronchitis: Secondary | ICD-10-CM | POA: Diagnosis present

## 2019-11-23 DIAGNOSIS — H538 Other visual disturbances: Secondary | ICD-10-CM | POA: Diagnosis present

## 2019-11-23 DIAGNOSIS — G459 Transient cerebral ischemic attack, unspecified: Secondary | ICD-10-CM | POA: Diagnosis not present

## 2019-11-23 DIAGNOSIS — Z20822 Contact with and (suspected) exposure to covid-19: Secondary | ICD-10-CM | POA: Diagnosis present

## 2019-11-23 DIAGNOSIS — E785 Hyperlipidemia, unspecified: Secondary | ICD-10-CM | POA: Diagnosis present

## 2019-11-23 DIAGNOSIS — E1165 Type 2 diabetes mellitus with hyperglycemia: Secondary | ICD-10-CM | POA: Diagnosis not present

## 2019-11-23 DIAGNOSIS — E1140 Type 2 diabetes mellitus with diabetic neuropathy, unspecified: Secondary | ICD-10-CM | POA: Diagnosis present

## 2019-11-23 DIAGNOSIS — R29898 Other symptoms and signs involving the musculoskeletal system: Secondary | ICD-10-CM | POA: Diagnosis not present

## 2019-11-23 DIAGNOSIS — R251 Tremor, unspecified: Secondary | ICD-10-CM | POA: Diagnosis present

## 2019-11-23 DIAGNOSIS — I351 Nonrheumatic aortic (valve) insufficiency: Secondary | ICD-10-CM | POA: Diagnosis present

## 2019-11-23 DIAGNOSIS — W1830XA Fall on same level, unspecified, initial encounter: Secondary | ICD-10-CM | POA: Diagnosis present

## 2019-11-23 DIAGNOSIS — R262 Difficulty in walking, not elsewhere classified: Secondary | ICD-10-CM

## 2019-11-23 DIAGNOSIS — M797 Fibromyalgia: Secondary | ICD-10-CM

## 2019-11-23 DIAGNOSIS — W19XXXA Unspecified fall, initial encounter: Secondary | ICD-10-CM

## 2019-11-23 DIAGNOSIS — W19XXXS Unspecified fall, sequela: Secondary | ICD-10-CM | POA: Diagnosis not present

## 2019-11-23 DIAGNOSIS — M4854XA Collapsed vertebra, not elsewhere classified, thoracic region, initial encounter for fracture: Secondary | ICD-10-CM | POA: Diagnosis present

## 2019-11-23 DIAGNOSIS — E860 Dehydration: Secondary | ICD-10-CM | POA: Diagnosis present

## 2019-11-23 DIAGNOSIS — E039 Hypothyroidism, unspecified: Secondary | ICD-10-CM

## 2019-11-23 DIAGNOSIS — Y92009 Unspecified place in unspecified non-institutional (private) residence as the place of occurrence of the external cause: Secondary | ICD-10-CM | POA: Diagnosis not present

## 2019-11-23 DIAGNOSIS — I10 Essential (primary) hypertension: Secondary | ICD-10-CM | POA: Diagnosis not present

## 2019-11-23 DIAGNOSIS — H547 Unspecified visual loss: Secondary | ICD-10-CM | POA: Diagnosis present

## 2019-11-23 DIAGNOSIS — R54 Age-related physical debility: Secondary | ICD-10-CM | POA: Diagnosis present

## 2019-11-23 DIAGNOSIS — G8929 Other chronic pain: Secondary | ICD-10-CM | POA: Diagnosis present

## 2019-11-23 DIAGNOSIS — Z90710 Acquired absence of both cervix and uterus: Secondary | ICD-10-CM | POA: Diagnosis not present

## 2019-11-23 DIAGNOSIS — R531 Weakness: Secondary | ICD-10-CM

## 2019-11-23 DIAGNOSIS — J301 Allergic rhinitis due to pollen: Secondary | ICD-10-CM | POA: Diagnosis not present

## 2019-11-23 DIAGNOSIS — J3089 Other allergic rhinitis: Secondary | ICD-10-CM | POA: Diagnosis not present

## 2019-11-23 DIAGNOSIS — Z23 Encounter for immunization: Secondary | ICD-10-CM | POA: Diagnosis not present

## 2019-11-23 DIAGNOSIS — B009 Herpesviral infection, unspecified: Secondary | ICD-10-CM | POA: Diagnosis present

## 2019-11-23 DIAGNOSIS — F419 Anxiety disorder, unspecified: Secondary | ICD-10-CM | POA: Diagnosis present

## 2019-11-23 DIAGNOSIS — J4520 Mild intermittent asthma, uncomplicated: Secondary | ICD-10-CM | POA: Diagnosis present

## 2019-11-23 DIAGNOSIS — M353 Polymyalgia rheumatica: Principal | ICD-10-CM

## 2019-11-23 LAB — COMPREHENSIVE METABOLIC PANEL
ALT: 8 U/L (ref 0–44)
AST: 14 U/L — ABNORMAL LOW (ref 15–41)
Albumin: 3.5 g/dL (ref 3.5–5.0)
Alkaline Phosphatase: 75 U/L (ref 38–126)
Anion gap: 10 (ref 5–15)
BUN: 12 mg/dL (ref 8–23)
CO2: 26 mmol/L (ref 22–32)
Calcium: 9.1 mg/dL (ref 8.9–10.3)
Chloride: 105 mmol/L (ref 98–111)
Creatinine, Ser: 0.97 mg/dL (ref 0.44–1.00)
GFR calc Af Amer: >60 mL/min (ref >60)
GFR calc non Af Amer: 55 mL/min — ABNORMAL LOW (ref >60)
Glucose, Bld: 100 mg/dL — ABNORMAL HIGH (ref 70–99)
Potassium: 3.5 mmol/L (ref 3.5–5.1)
Sodium: 141 mmol/L (ref 135–145)
Total Bilirubin: 0.5 mg/dL (ref 0.3–1.2)
Total Protein: 6.4 g/dL — ABNORMAL LOW (ref 6.5–8.1)

## 2019-11-23 LAB — ECHOCARDIOGRAM COMPLETE
Area-P 1/2: 3.08 cm2
Height: 63 in
S' Lateral: 2.48 cm
Weight: 2576 oz

## 2019-11-23 LAB — APTT: aPTT: 29 seconds (ref 24–36)

## 2019-11-23 LAB — TROPONIN I (HIGH SENSITIVITY)
Troponin I (High Sensitivity): 7 ng/L (ref <18)
Troponin I (High Sensitivity): 7 ng/L (ref <18)

## 2019-11-23 LAB — CBC
HCT: 38.3 % (ref 36.0–46.0)
Hemoglobin: 11.9 g/dL — ABNORMAL LOW (ref 12.0–15.0)
MCH: 30.1 pg (ref 26.0–34.0)
MCHC: 31.1 g/dL (ref 30.0–36.0)
MCV: 96.7 fL (ref 80.0–100.0)
Platelets: 315 10*3/uL (ref 150–400)
RBC: 3.96 MIL/uL (ref 3.87–5.11)
RDW: 14.3 % (ref 11.5–15.5)
WBC: 9.1 10*3/uL (ref 4.0–10.5)
nRBC: 0 % (ref 0.0–0.2)

## 2019-11-23 LAB — HEPATIC FUNCTION PANEL
ALT: 10 U/L (ref 0–44)
AST: 16 U/L (ref 15–41)
Albumin: 3.8 g/dL (ref 3.5–5.0)
Alkaline Phosphatase: 78 U/L (ref 38–126)
Bilirubin, Direct: 0.1 mg/dL (ref 0.0–0.2)
Indirect Bilirubin: 0.5 mg/dL (ref 0.3–0.9)
Total Bilirubin: 0.6 mg/dL (ref 0.3–1.2)
Total Protein: 6.9 g/dL (ref 6.5–8.1)

## 2019-11-23 LAB — MAGNESIUM: Magnesium: 1.8 mg/dL (ref 1.7–2.4)

## 2019-11-23 LAB — PROTIME-INR
INR: 1 (ref 0.8–1.2)
Prothrombin Time: 12.6 seconds (ref 11.4–15.2)

## 2019-11-23 LAB — AMMONIA: Ammonia: 12 umol/L (ref 9–35)

## 2019-11-23 LAB — PHOSPHORUS: Phosphorus: 3.1 mg/dL (ref 2.5–4.6)

## 2019-11-23 LAB — SARS CORONAVIRUS 2 BY RT PCR (HOSPITAL ORDER, PERFORMED IN ~~LOC~~ HOSPITAL LAB): SARS Coronavirus 2: NEGATIVE

## 2019-11-23 LAB — SEDIMENTATION RATE: Sed Rate: 10 mm/hr (ref 0–22)

## 2019-11-23 LAB — TSH: TSH: 1.928 u[IU]/mL (ref 0.350–4.500)

## 2019-11-23 LAB — CK: Total CK: 40 U/L (ref 38–234)

## 2019-11-23 IMAGING — CT CT CERVICAL SPINE W/O CM
3 series · 12 of 33 positions shown, 14 images · non-contrast
Comparison: Prior CT from [DATE].

CLINICAL DATA: Initial evaluation for weakness, fall, mid back
pain.

EXAM:
CT CERVICAL, THORACIC, AND LUMBAR SPINE WITHOUT CONTRAST
TECHNIQUE: Multidetector CT imaging of the cervical, thoracic and lumbar spine
was performed without intravenous contrast. Multiplanar CT image
reconstructions were also generated.

[Series 4: c spine soft · axial · 0.30mm/px · z∈[+1094,+1204]mm · 4 of 81 slices shown, 5 images]
[im 13/81  soft-tissue]
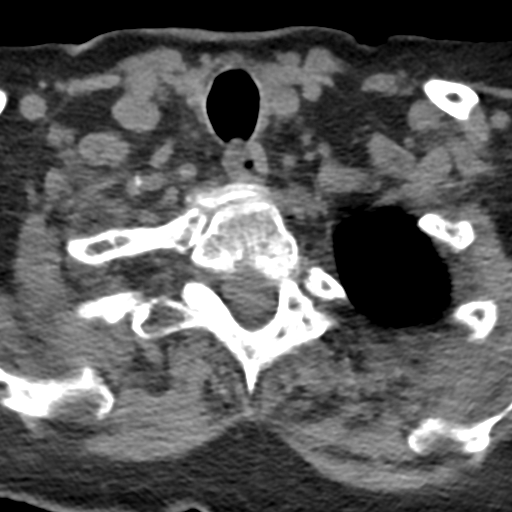
[im 13/81  bone]
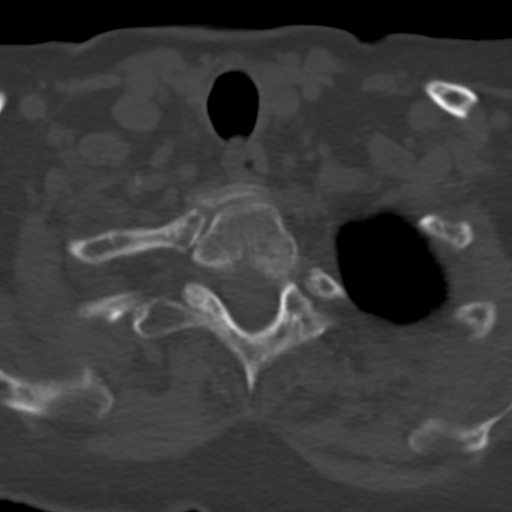
[im 31/81  bone]
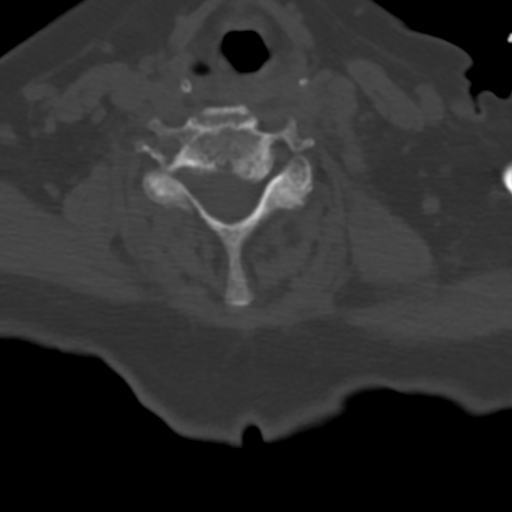
[im 50/81  bone]
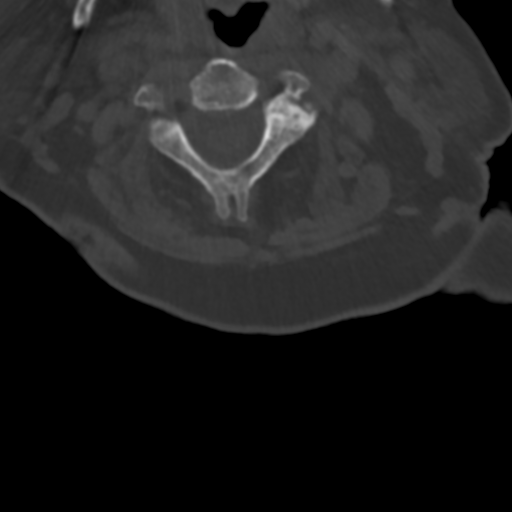
[im 68/81  bone]
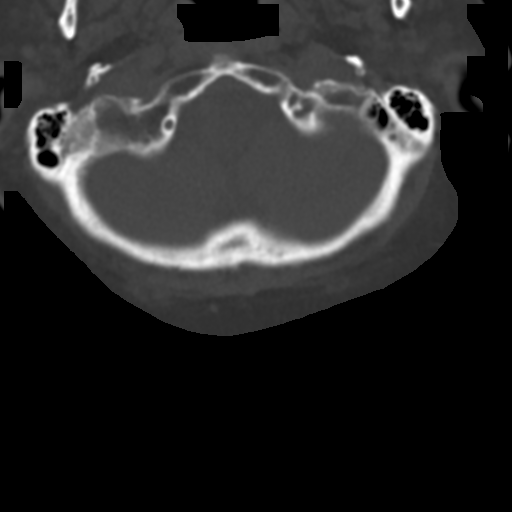

[Series 5: sag bone · sagittal · 0.24mm/px · 5 of 61 slices shown, 6 images]
[im 21/61  bone]
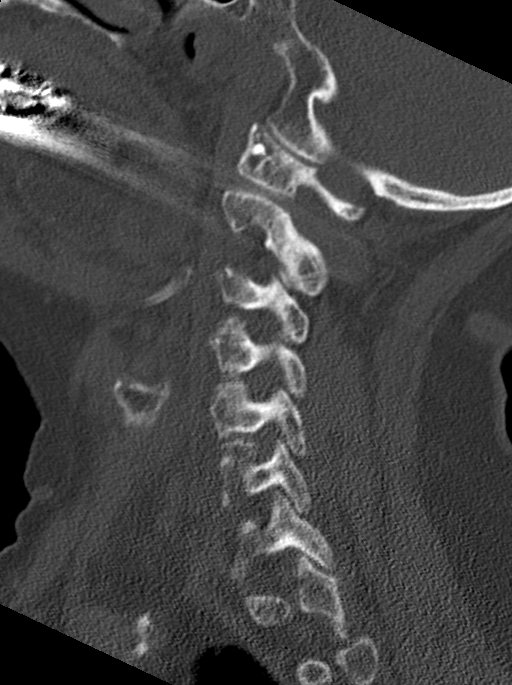
[im 26/61  bone]
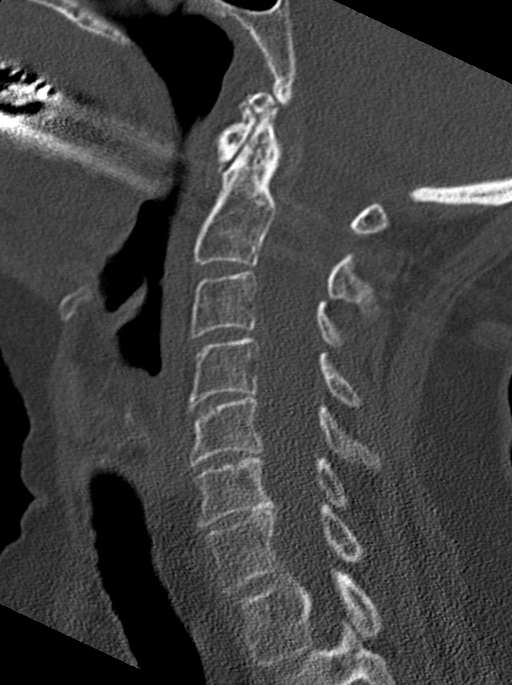
[im 31/61  soft-tissue]
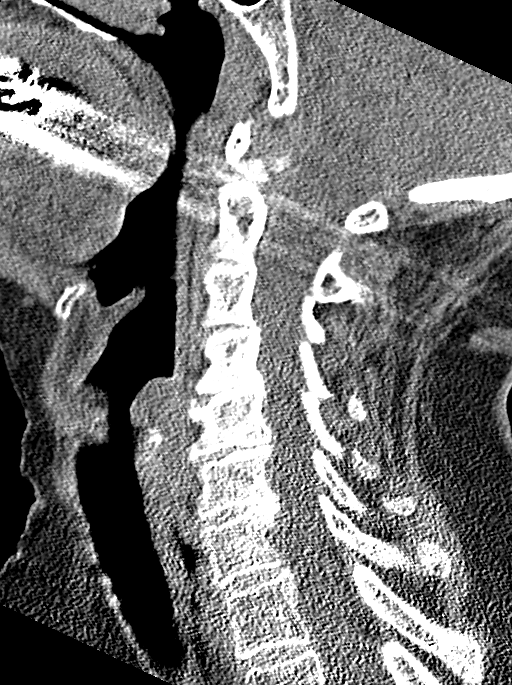
[im 31/61  bone]
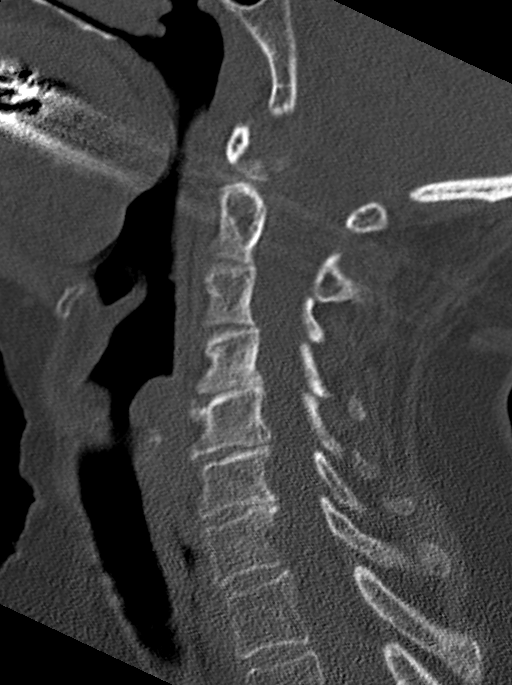
[im 36/61  bone]
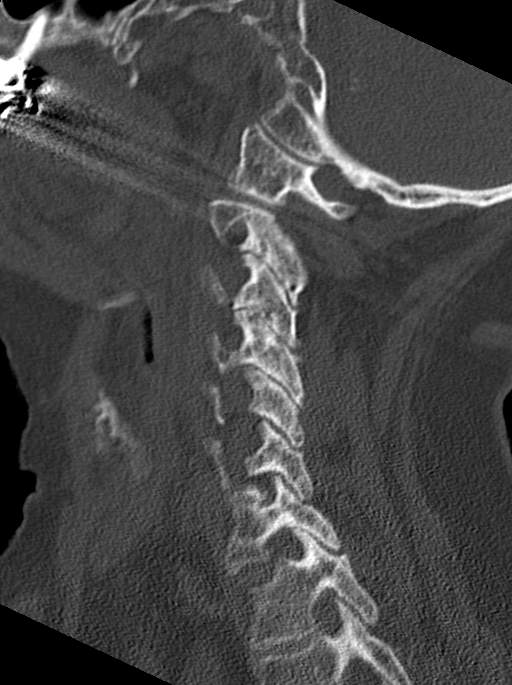
[im 41/61  bone]
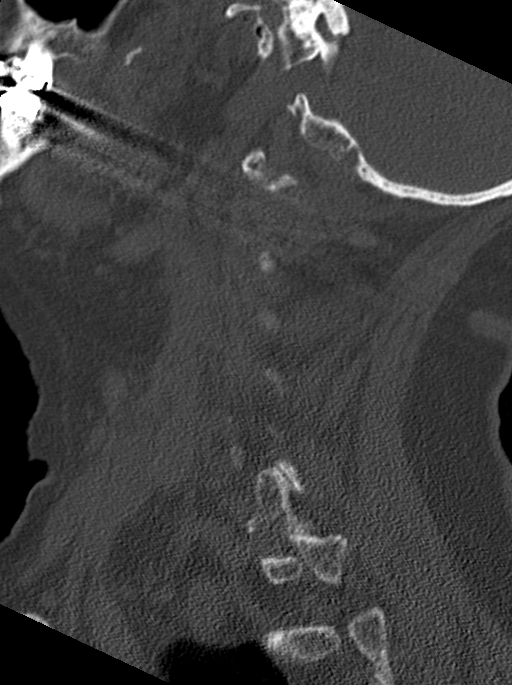

[Series 6: cor bone · coronal · 0.24mm/px · 3 of 61 slices shown]
[im 17/61  bone]
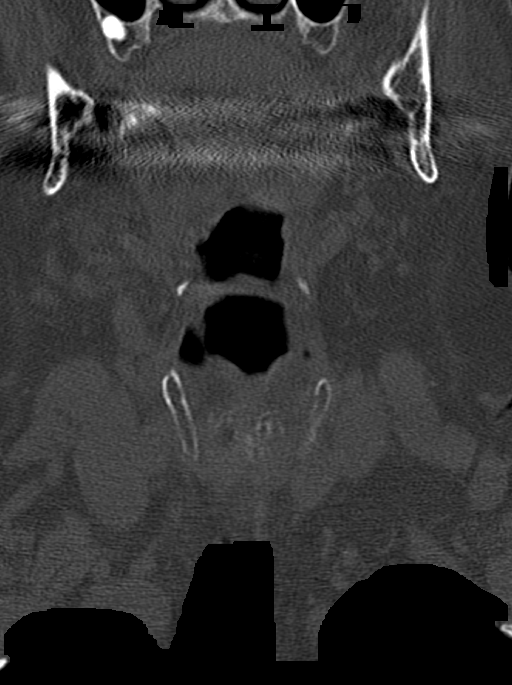
[im 26/61  bone]
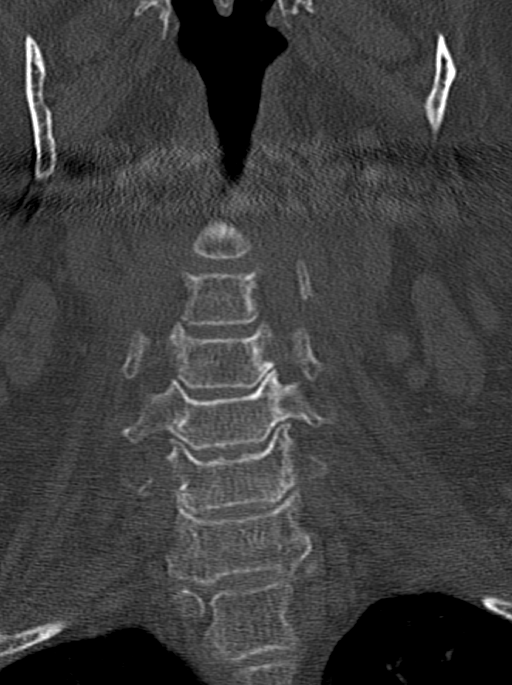
[im 35/61  bone]
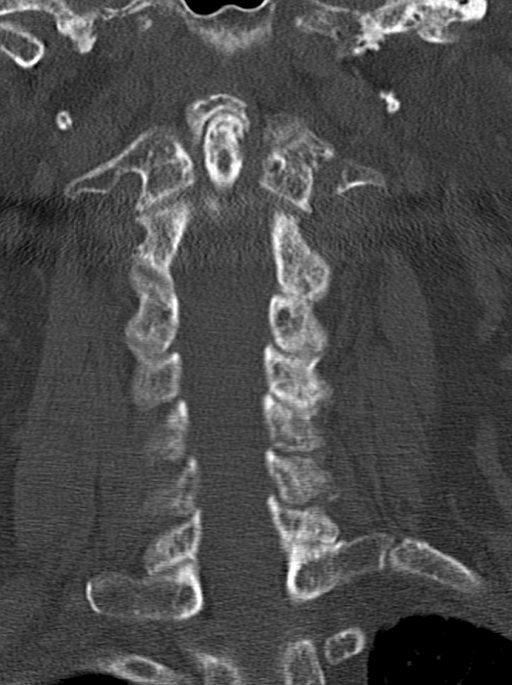

[12 of 33 positions shown; findings below may reference images not displayed]

FINDINGS: CT CERVICAL SPINE FINDINGS

Alignment: Straightening of the normal cervical lordosis. Trace
anterolisthesis of C7 on T1, likely chronic and facet mediated. No
malalignment.

Skull base and vertebrae: Skull base intact. Normal C1-2
articulations are preserved in the dens is intact. Vertebral body
heights maintained. No acute fracture.

Soft tissues and spinal canal: Soft tissues of the neck demonstrate
no acute finding. No abnormal prevertebral edema. Spinal canal
within normal limits. Vascular calcifications about the carotid
bifurcations.

Disc levels: Prominent degenerative changes noted about the C1-2
articulation. Mild cervical spondylosis noted at C4-5 through C6-7
without significant stenosis. Moderate multilevel left-sided facet
hypertrophy.

Upper chest: Visualized upper chest demonstrates no acute finding.
Partially visualized lung apices are clear.

Other: None.

CT THORACIC SPINE FINDINGS

Alignment: Mild scoliosis. Alignment otherwise normal with
preservation of the normal thoracic kyphosis. No listhesis or
malalignment.

Vertebrae: Compression deformity involving what is favored to be the
T5 vertebral body with up to 50% height loss without significant
bony retropulsion, chronic in appearance. Vertebral body height
otherwise maintained without acute fracture. No discrete or
worrisome osseous lesions. Visualized ribs are grossly intact.

Paraspinal and other soft tissues: Paraspinous soft tissues within
normal limits. Partially visualized lungs are grossly clear. Aortic
atherosclerosis.

Disc levels: No significant disc pathology seen within the thoracic
spine. No appreciable stenosis.

CT LUMBAR SPINE FINDINGS

Segmentation: Standard.

Alignment: Trace dextroscoliosis. Alignment otherwise normal with
preservation of the normal lumbar lordosis. No listhesis or
malalignment.

Vertebrae: Vertebral body height maintained without acute or chronic
fracture. Visualized sacrum and pelvis intact. SI joints
approximated symmetric. No worrisome osseous lesions.

Paraspinal and other soft tissues: Paraspinous soft tissues
demonstrate no acute finding. Aortic atherosclerosis. Diverticulosis
noted about the partially visualized sigmoid colon. Visualized
visceral structures otherwise unremarkable.

Disc levels:

L1-2:  Unremarkable.

L2-3:  Unremarkable.

L3-4: No significant disc bulge. Mild facet hypertrophy. No
stenosis.

L4-5: Minimal disc bulge. Bilateral facet hypertrophy. No
significant stenosis.

L5-S1: Degenerative intervertebral disc space narrowing with diffuse
disc bulge and reactive endplate change. Moderate facet hypertrophy.
No significant spinal stenosis. Mild bilateral L5 foraminal
narrowing.
IMPRESSION: 1. No acute traumatic injury within the cervical, thoracic, or
lumbar spine.
2. Chronic compression deformity of T5 with up to 50% height loss
without bony retropulsion.
3. Degenerative spondylosis at L5-S1 with resultant mild bilateral
L5 foraminal stenosis.
4.  Aortic Atherosclerosis ([VQ]-[VQ]).

## 2019-11-23 IMAGING — US US EXTREM LOW VENOUS
1 series · 13 of 24 positions shown · non-contrast
Comparison: None.

CLINICAL DATA: Lower extremity edema bilaterally

EXAM:
BILATERAL LOWER EXTREMITY VENOUS DUPLEX ULTRASOUND
TECHNIQUE: Gray-scale sonography with graded compression, as well as color
Doppler and duplex ultrasound were performed to evaluate the lower
extremity deep venous systems from the level of the common femoral
vein and including the common femoral, femoral, profunda femoral,
popliteal and calf veins including the posterior tibial, peroneal
and gastrocnemius veins when visible. The superficial great
saphenous vein was also interrogated. Spectral Doppler was utilized
to evaluate flow at rest and with distal augmentation maneuvers in
the common femoral, femoral and popliteal veins.

[Series 1: us venous img lower bilat (dvt) · portal-venous · 13 of 59 slices shown]
[im 1/59]
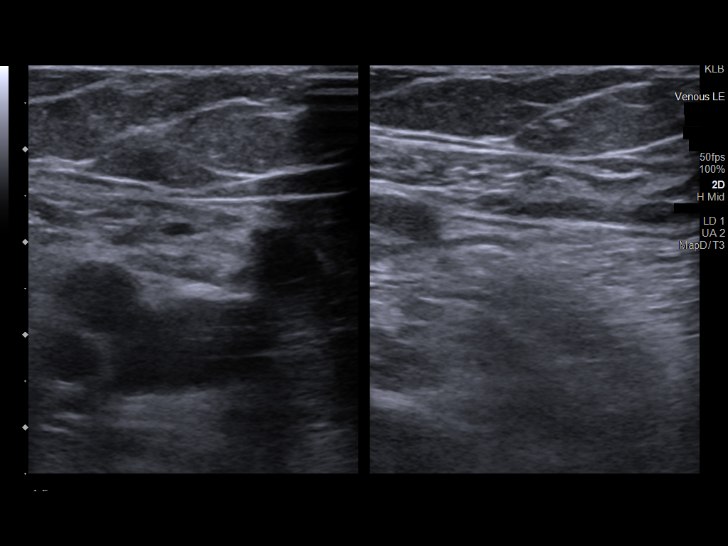
[im 6/59]
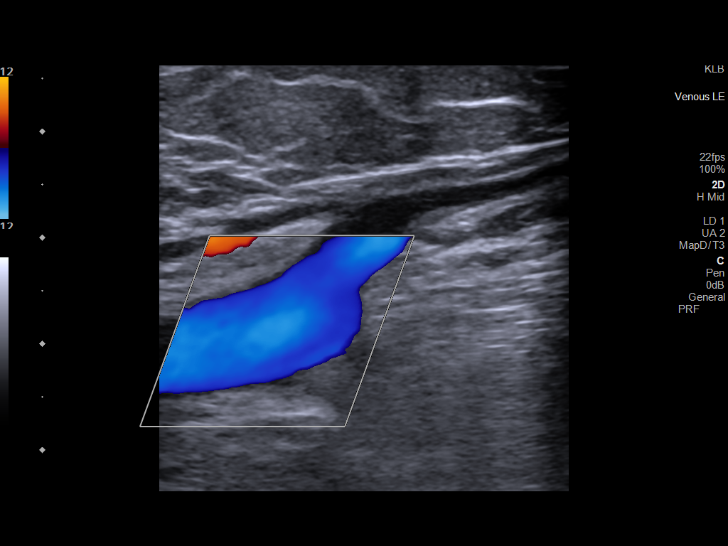
[im 11/59]
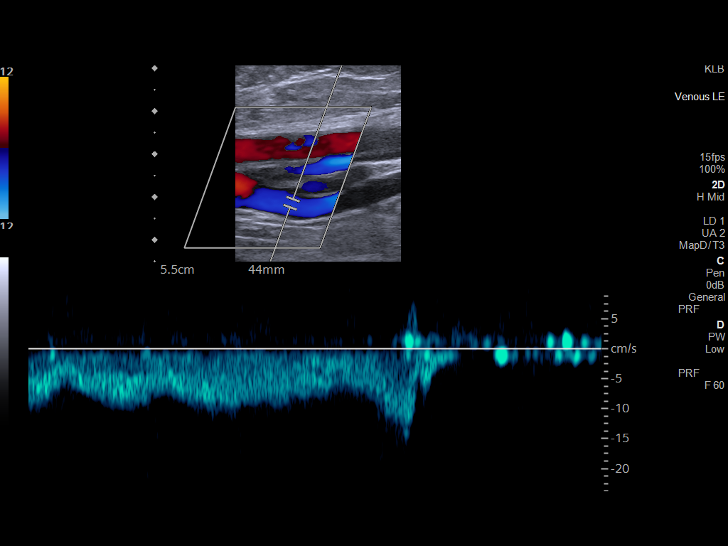
[im 16/59]
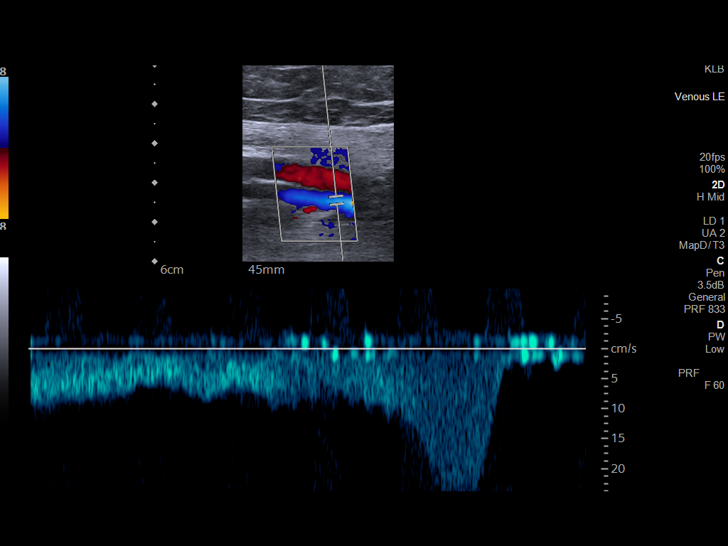
[im 21/59]
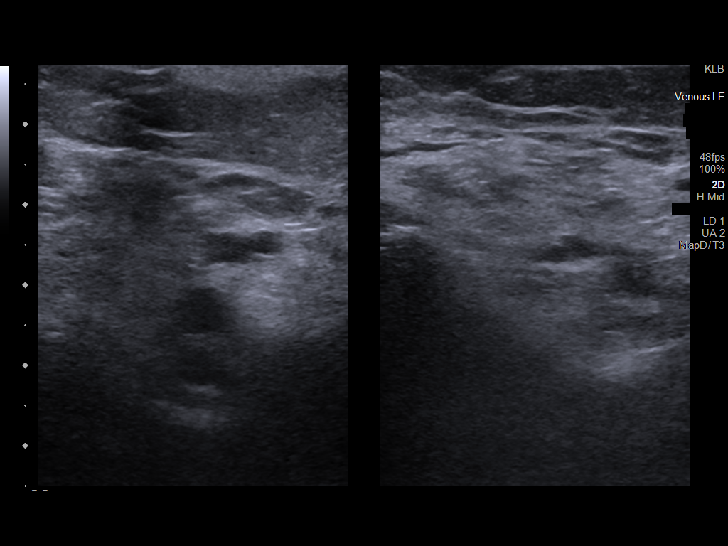
[im 26/59]
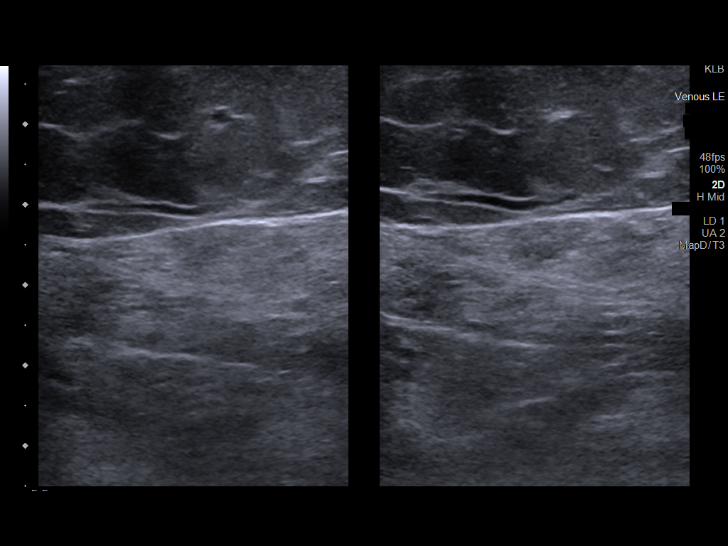
[im 31/59]
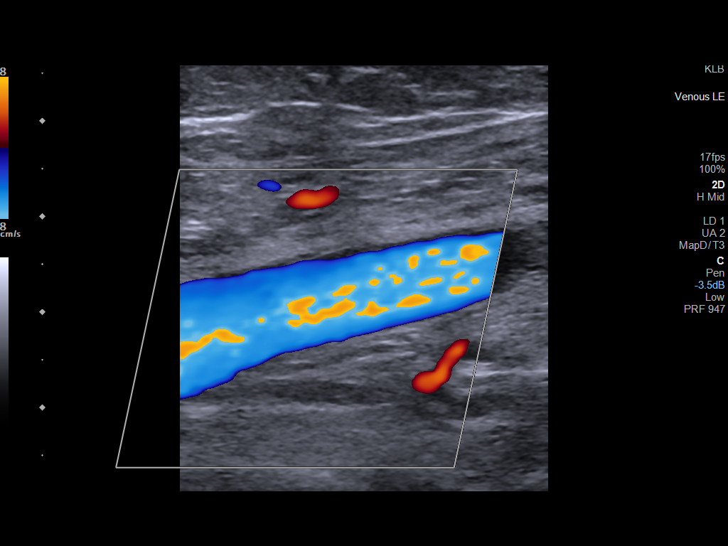
[im 33/59]
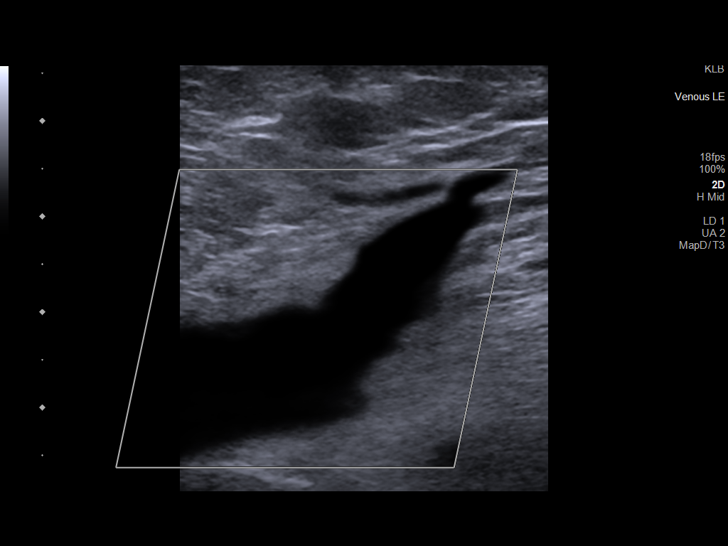
[im 38/59]
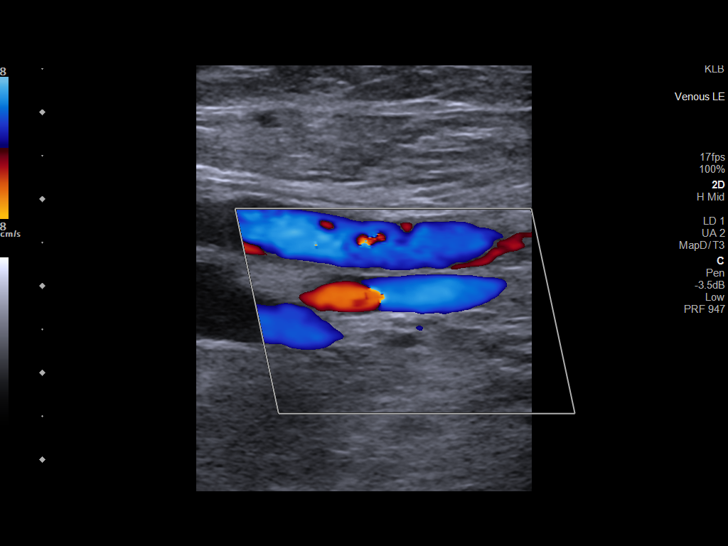
[im 43/59]
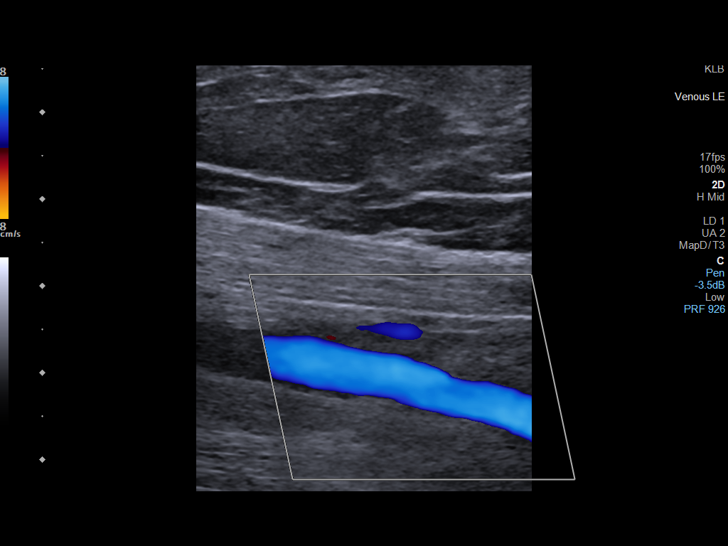
[im 48/59]
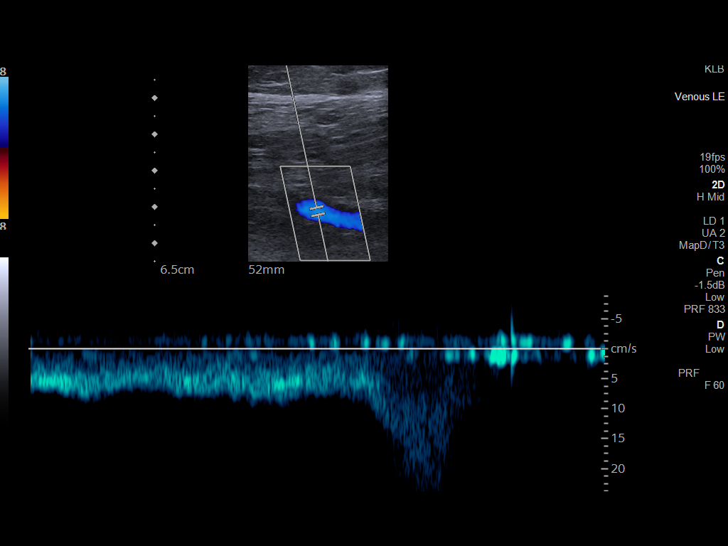
[im 53/59]
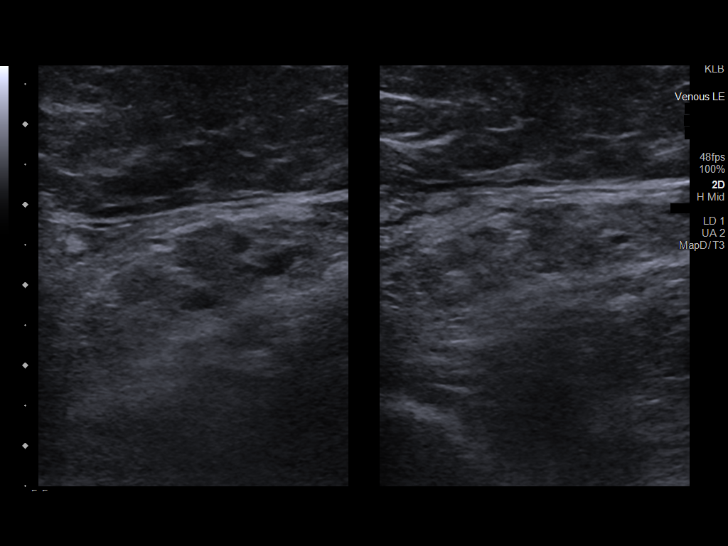
[im 59/59]
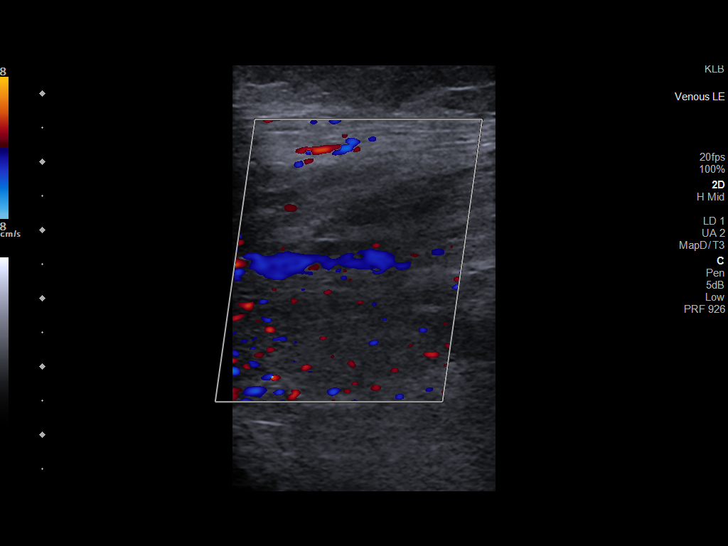

[13 of 24 positions shown; findings below may reference images not displayed]

FINDINGS: RIGHT LOWER EXTREMITY

Common Femoral Vein: No evidence of thrombus. Normal
compressibility, respiratory phasicity and response to augmentation.

Saphenofemoral Junction: No evidence of thrombus. Normal
compressibility and flow on color Doppler imaging.

Profunda Femoral Vein: No evidence of thrombus. Normal
compressibility and flow on color Doppler imaging.

Femoral Vein: No evidence of thrombus. Normal compressibility,
respiratory phasicity and response to augmentation.

Popliteal Vein: No evidence of thrombus. Normal compressibility,
respiratory phasicity and response to augmentation.

Calf Veins: No evidence of thrombus. Normal compressibility and flow
on color Doppler imaging.

Superficial Great Saphenous Vein: No evidence of thrombus. Normal
compressibility.

Venous Reflux:  None.

Other Findings:  None.

LEFT LOWER EXTREMITY

Common Femoral Vein: No evidence of thrombus. Normal
compressibility, respiratory phasicity and response to augmentation.

Saphenofemoral Junction: No evidence of thrombus. Normal
compressibility and flow on color Doppler imaging.

Profunda Femoral Vein: No evidence of thrombus. Normal
compressibility and flow on color Doppler imaging.

Femoral Vein: No evidence of thrombus. Normal compressibility,
respiratory phasicity and response to augmentation.

Popliteal Vein: No evidence of thrombus. Normal compressibility,
respiratory phasicity and response to augmentation.

Calf Veins: No evidence of thrombus. Normal compressibility and flow
on color Doppler imaging.

Superficial Great Saphenous Vein: No evidence of thrombus. Normal
compressibility.

Venous Reflux:  None.

Other Findings:  None.
IMPRESSION: No evidence of deep venous thrombosis in either lower extremity.

## 2019-11-23 IMAGING — DX DG CHEST 1V PORT
1 series · 1 of 1 positions shown · non-contrast
Comparison: [DATE]

CLINICAL DATA: Generalized weakness.

EXAM:
PORTABLE CHEST 1 VIEW

[chest ap]
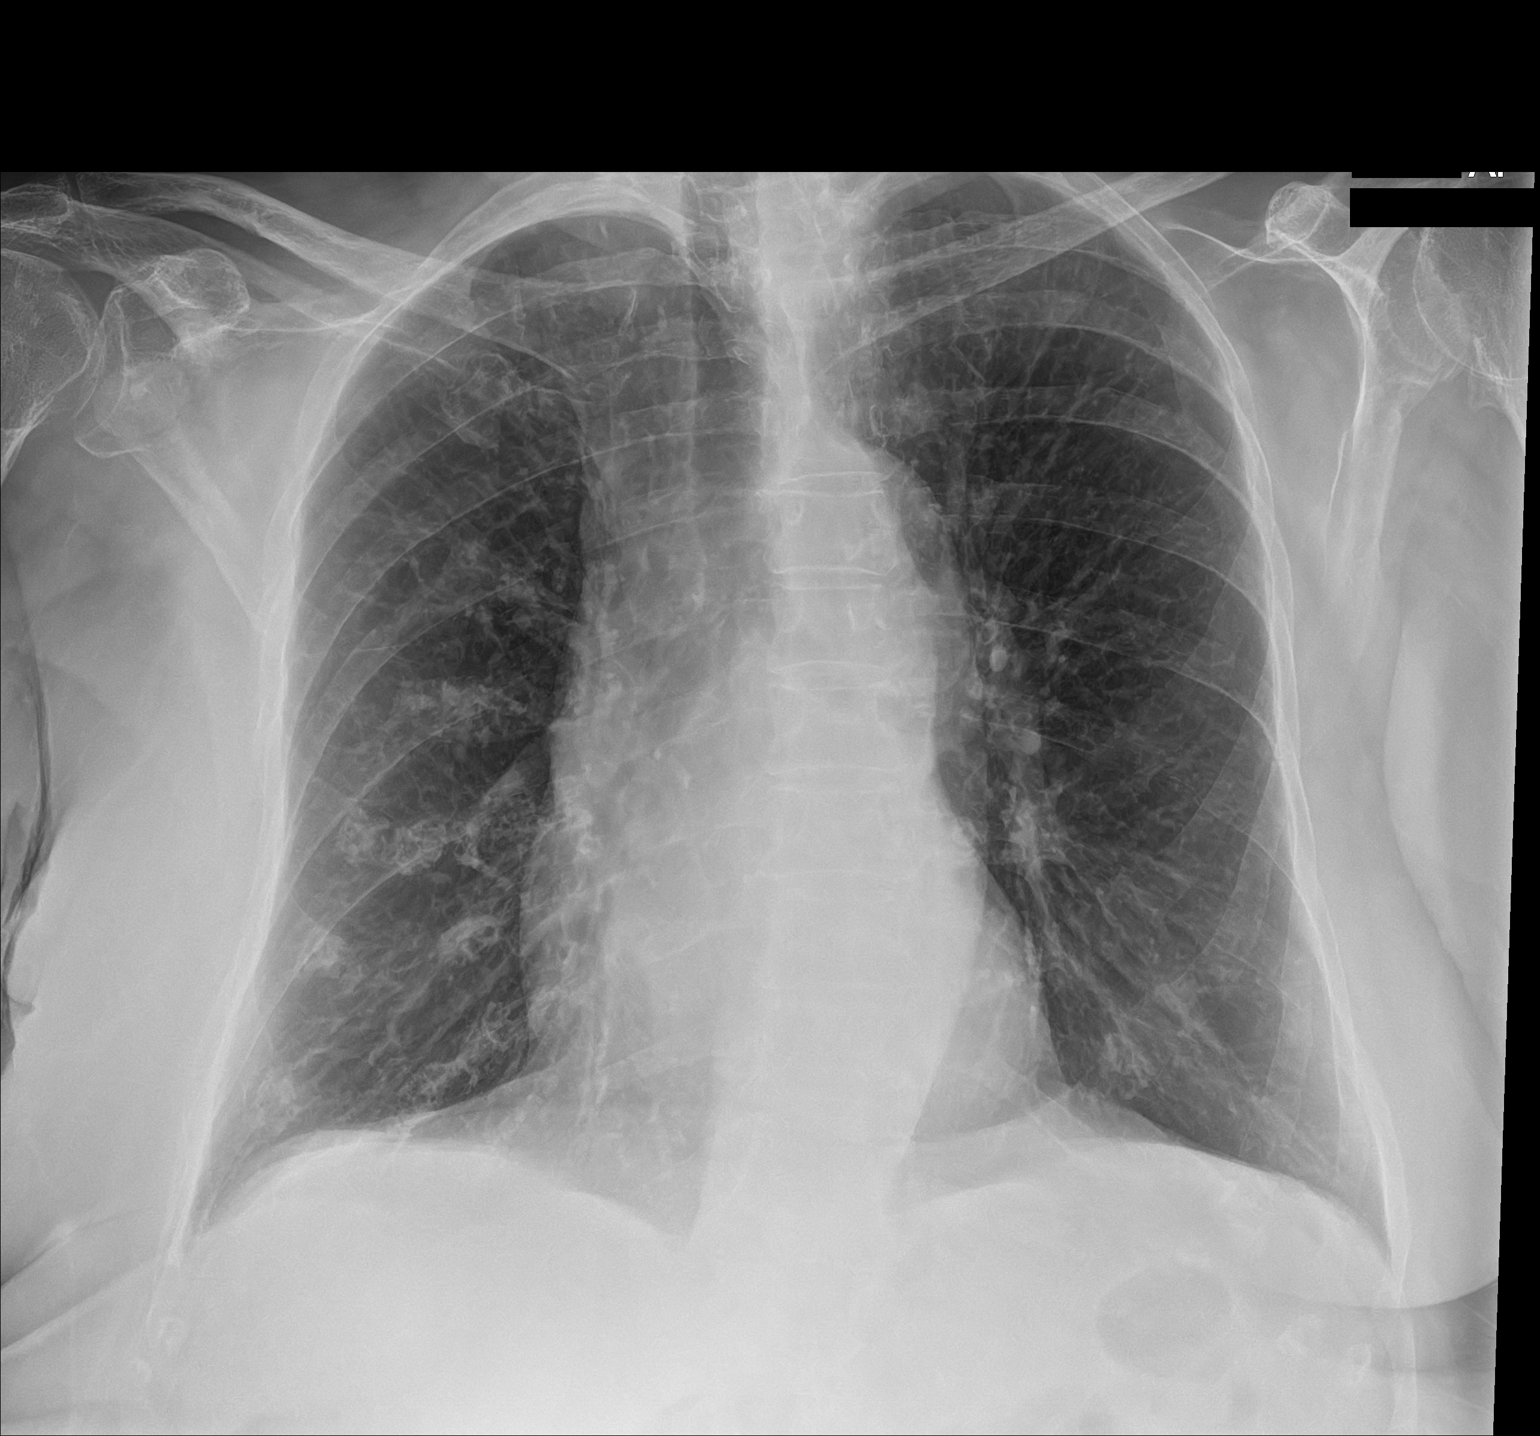

[1 of 1 positions shown; findings below may reference images not displayed]

FINDINGS: Heart size and pulmonary vascularity are normal. Emphysematous
changes in the lungs. Peribronchial thickening and streaky perihilar
opacities suggesting chronic bronchitis. No focal consolidation. No
pleural effusions. No pneumothorax. Mediastinal contours appear
intact. Degenerative changes in the spine and shoulders. Old
bilateral rib fractures.
IMPRESSION: Emphysematous and chronic bronchitic changes in the lungs. No
evidence of active pulmonary disease.

## 2019-11-23 IMAGING — US US CAROTID DUPLEX BILAT
1 series · 13 of 24 positions shown · non-contrast
Comparison: None.

CLINICAL DATA: Generalized weakness, shakiness and blurred vision.

EXAM:
BILATERAL CAROTID DUPLEX ULTRASOUND
TECHNIQUE: Gray scale imaging, color Doppler and duplex ultrasound were
performed of bilateral carotid and vertebral arteries in the neck.

[Series 1: us carotid bilateral · 13 of 69 slices shown]
[im 1/69]
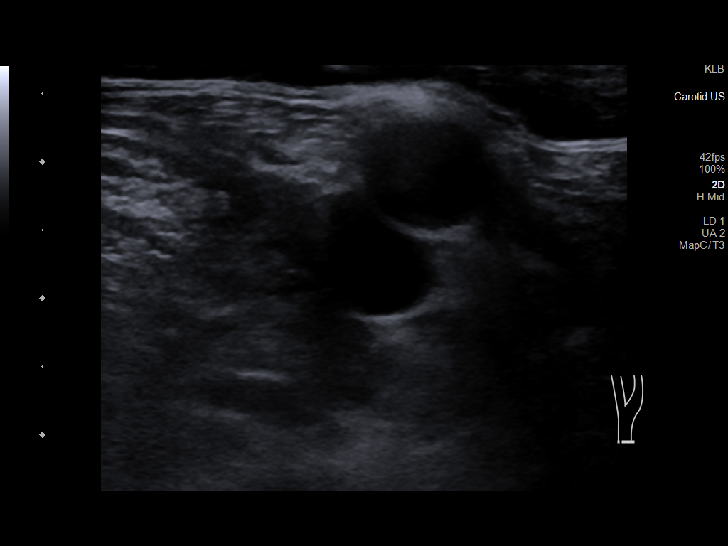
[im 6/69]
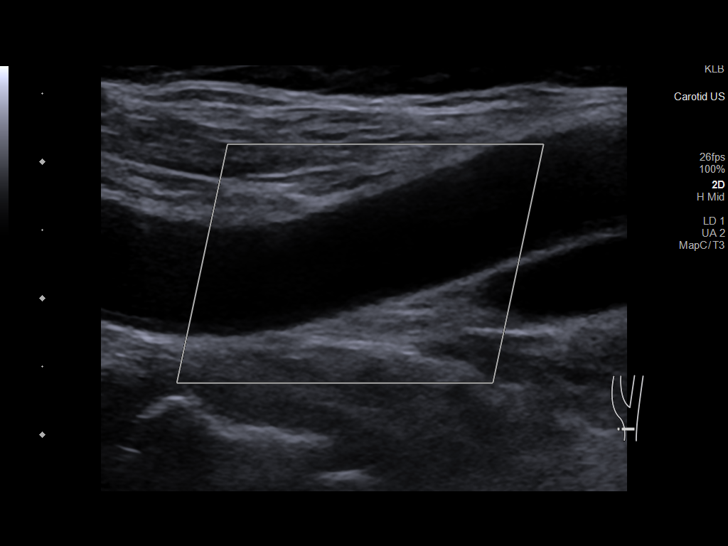
[im 12/69]
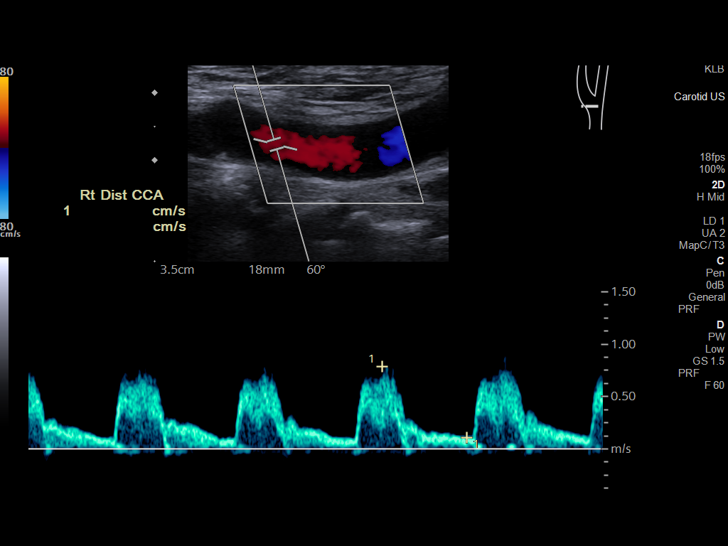
[im 18/69]
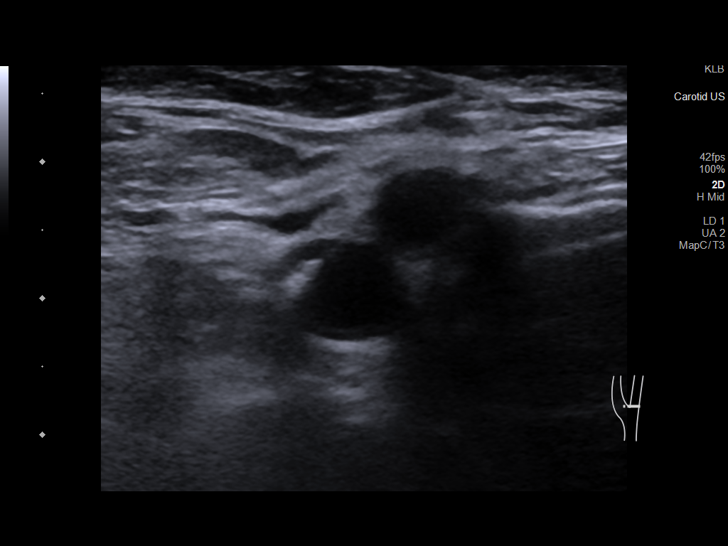
[im 24/69]
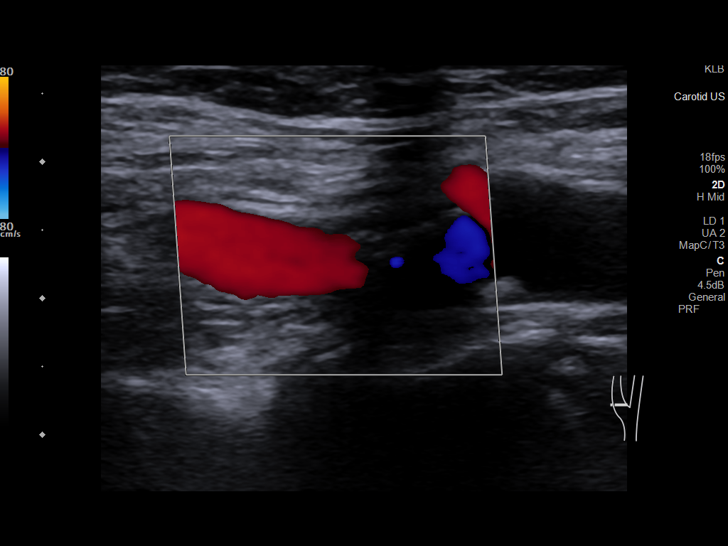
[im 30/69]
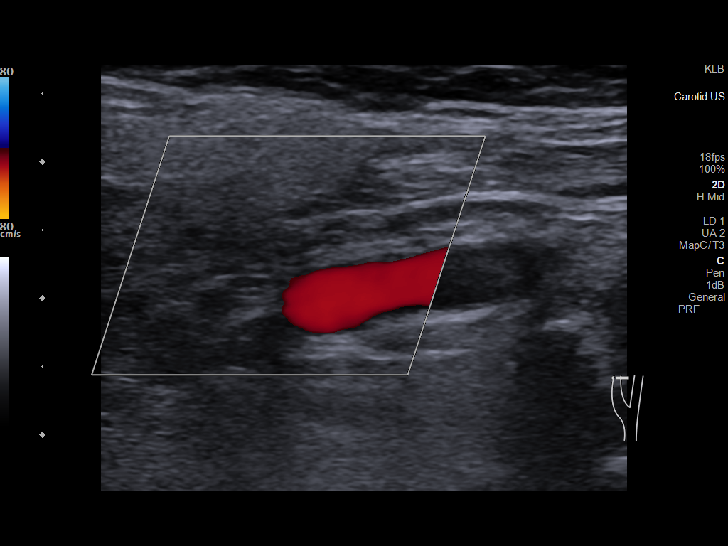
[im 36/69]
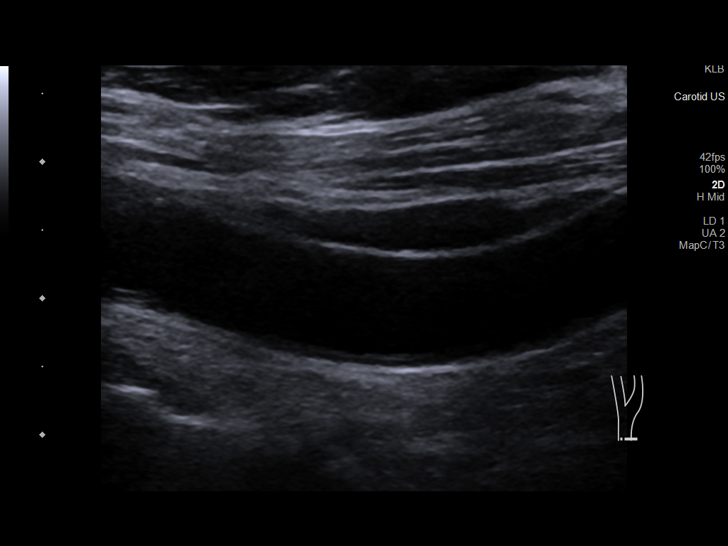
[im 39/69]
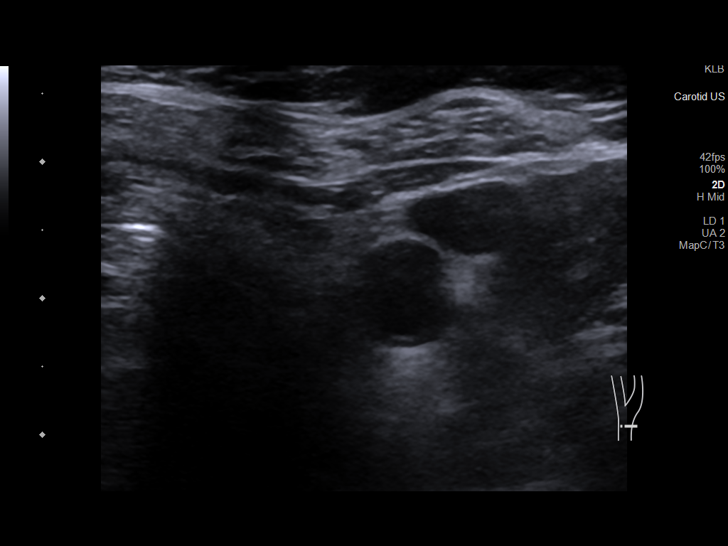
[im 45/69]
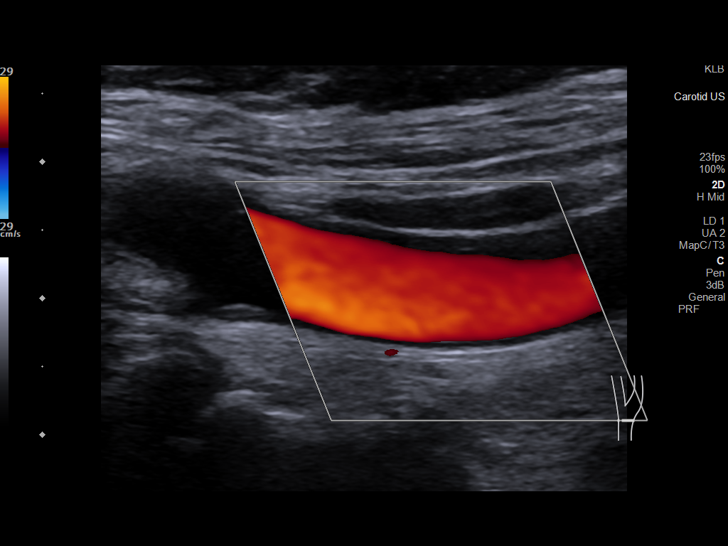
[im 51/69]
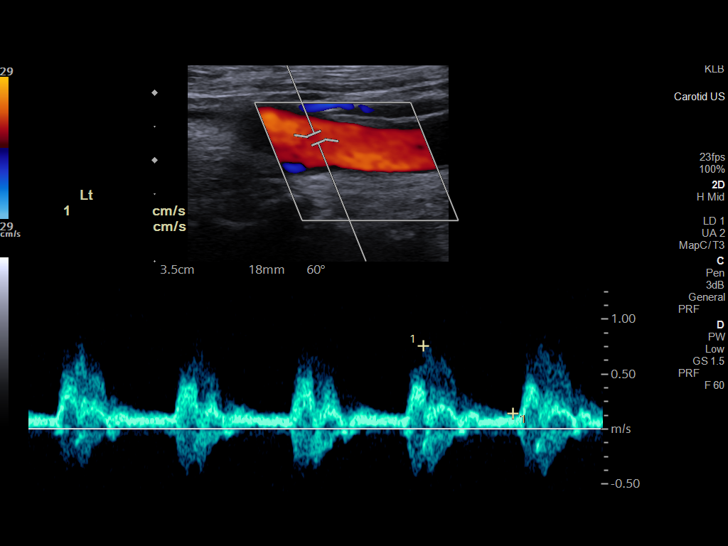
[im 57/69]
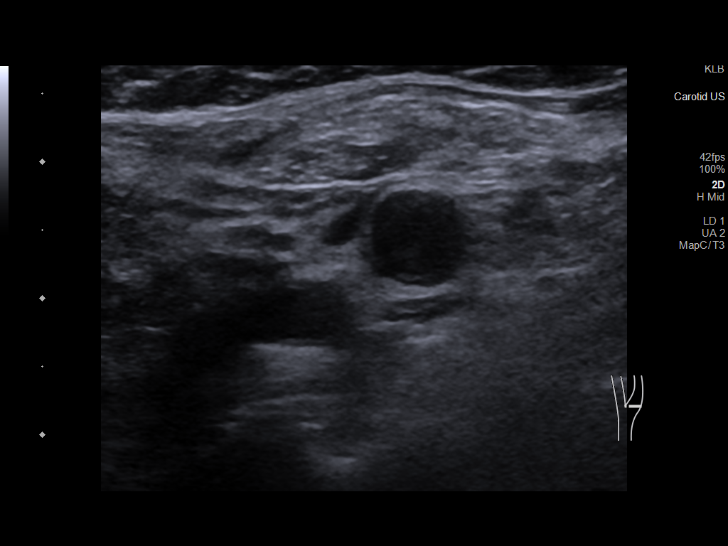
[im 63/69]
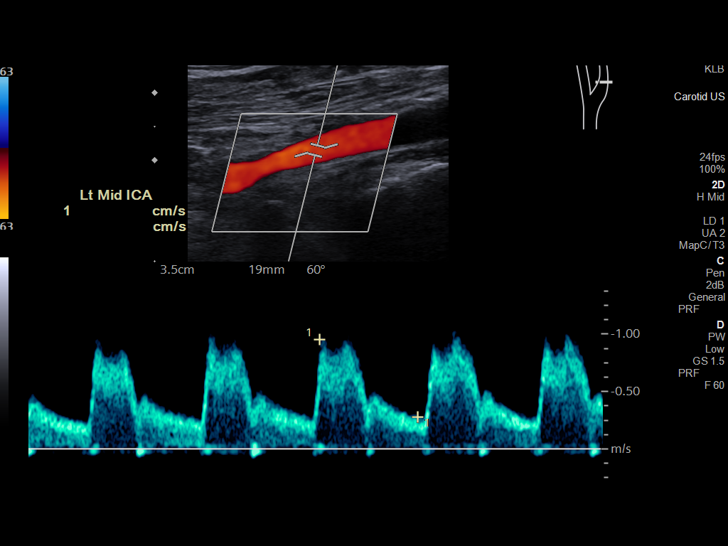
[im 69/69]
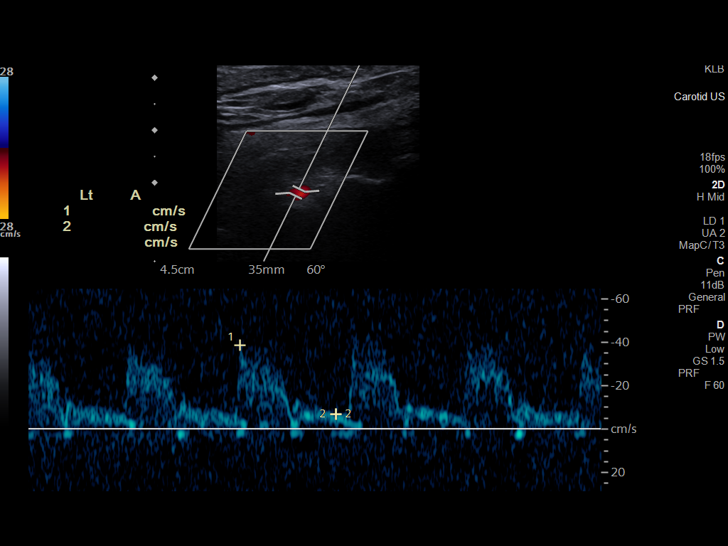

[13 of 24 positions shown; findings below may reference images not displayed]

FINDINGS: Criteria: Quantification of carotid stenosis is based on velocity
parameters that correlate the residual internal carotid diameter
with NASCET-based stenosis levels, using the diameter of the distal
internal carotid lumen as the denominator for stenosis measurement.

The following velocity measurements were obtained:

RIGHT

ICA:  122/31 cm/sec

CCA:  95/10 cm/sec

SYSTOLIC ICA/CCA RATIO:

ECA:  91 cm/sec

LEFT

ICA:  119/34 cm/sec

CCA:  62/8 cm/sec

SYSTOLIC ICA/CCA RATIO:

ECA:  92 cm/sec

RIGHT CAROTID ARTERY: There is a mild amount of predominately
calcified plaque at right carotid bulb and proximal right ICA.
Estimated right ICA stenosis is less than 50%.

RIGHT VERTEBRAL ARTERY: Antegrade flow with normal waveform and
velocity.

LEFT CAROTID ARTERY: Mild amount of partially calcified plaque is
present at the level of the carotid bulb. No significant left ICA
plaque or evidence of left ICA stenosis.

LEFT VERTEBRAL ARTERY: Antegrade flow with normal waveform and
velocity.
IMPRESSION: Mild amount of plaque at the level of both carotid bulbs and the
proximal right ICA. Estimated right ICA stenosis is less than 50%.
No evidence of left ICA stenosis in the neck.

## 2019-11-23 IMAGING — CT CT T SPINE W/O CM
2 of 3 series · 12 of 33 positions shown, 15 images · non-contrast
Comparison: Prior CT from [DATE].

CLINICAL DATA: Initial evaluation for weakness, fall, mid back
pain.

EXAM:
CT CERVICAL, THORACIC, AND LUMBAR SPINE WITHOUT CONTRAST
TECHNIQUE: Multidetector CT imaging of the cervical, thoracic and lumbar spine
was performed without intravenous contrast. Multiplanar CT image
reconstructions were also generated.

[Series 3: t spine soft · axial · 0.24mm/px · z∈[+864,+1064]mm · 9 of 120 slices shown, 12 images]
[im 10/120  soft-tissue]
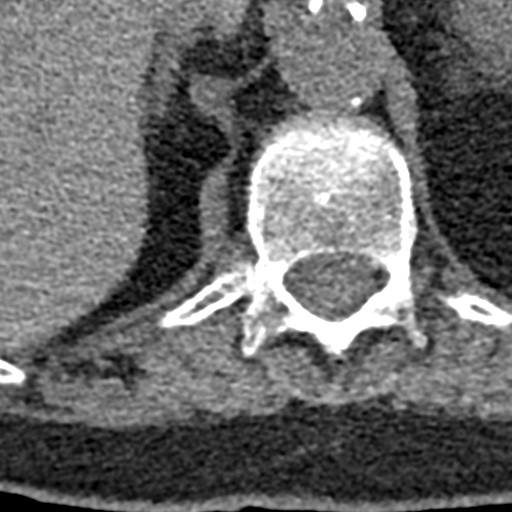
[im 10/120  bone]
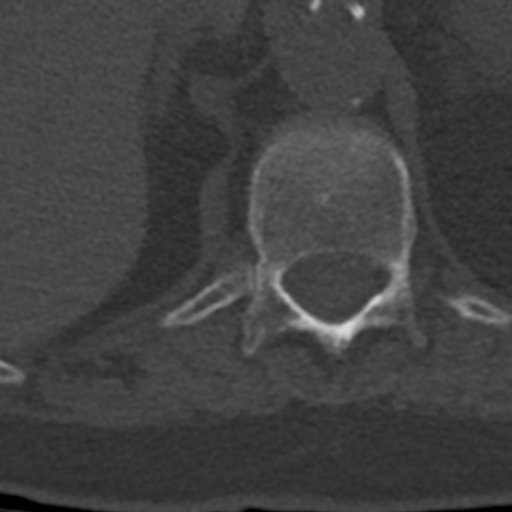
[im 28/120  bone]
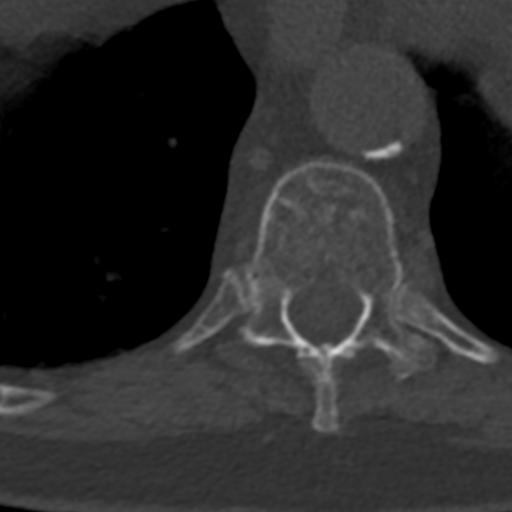
[im 37/120  bone]
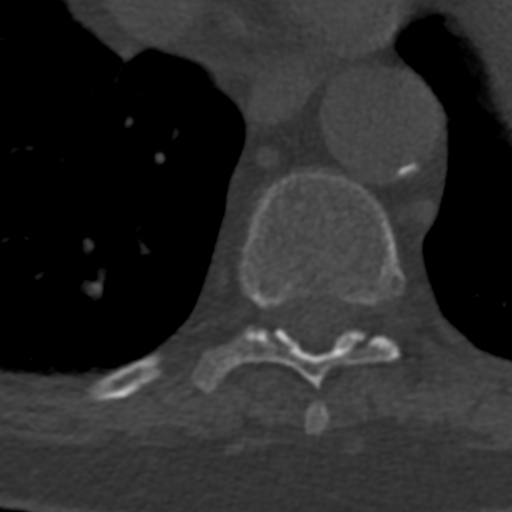
[im 46/120  bone]
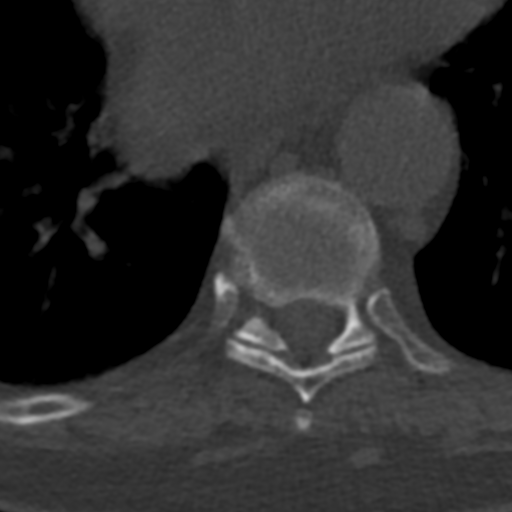
[im 65/120  soft-tissue]
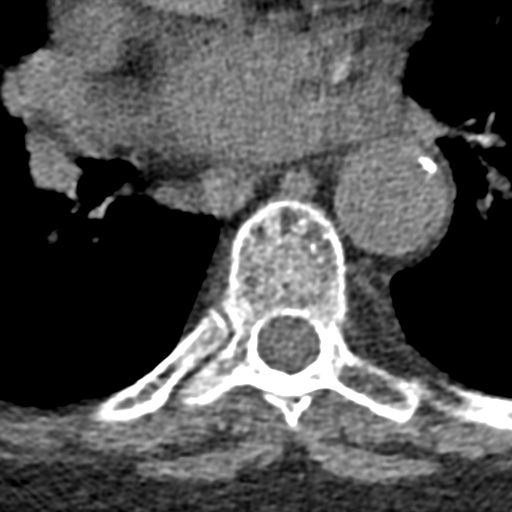
[im 65/120  bone]
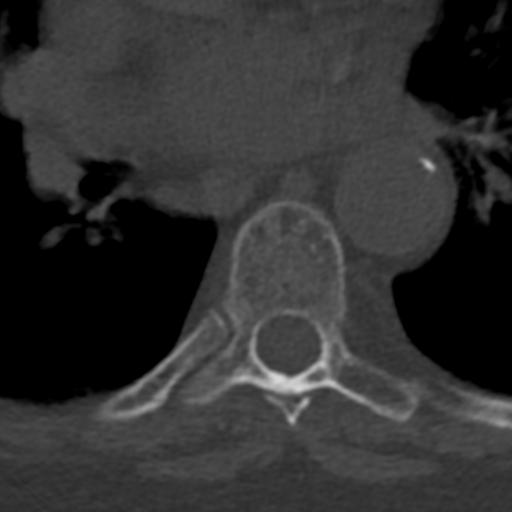
[im 74/120  bone]
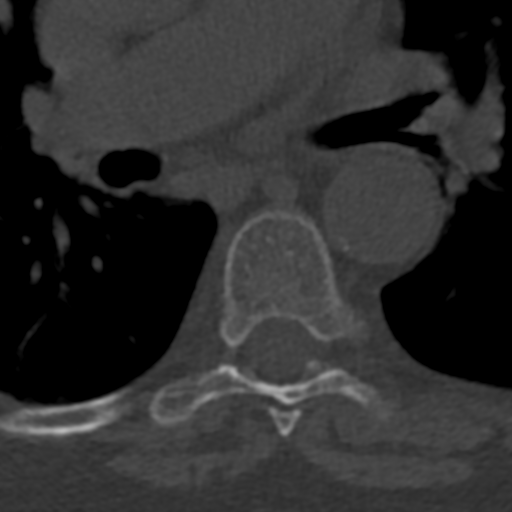
[im 83/120  bone]
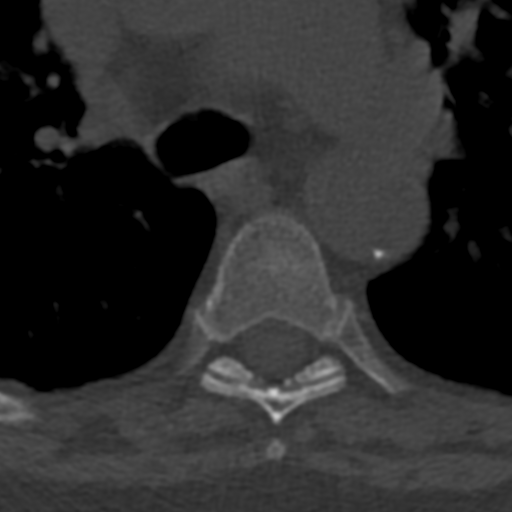
[im 101/120  bone]
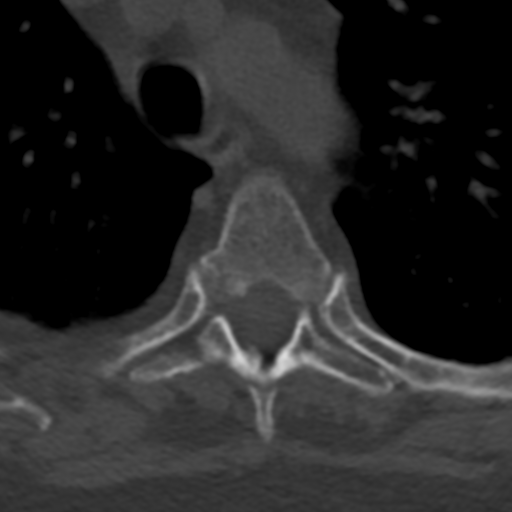
[im 110/120  soft-tissue]
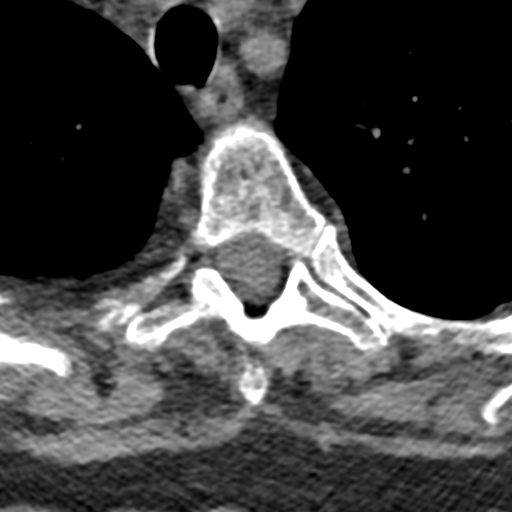
[im 110/120  bone]
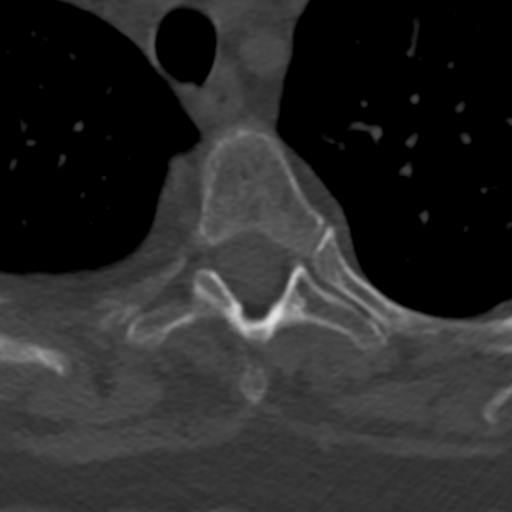

[Series 5: cor bone · coronal · 0.25mm/px · 3 of 61 slices shown]
[im 13/61  bone]
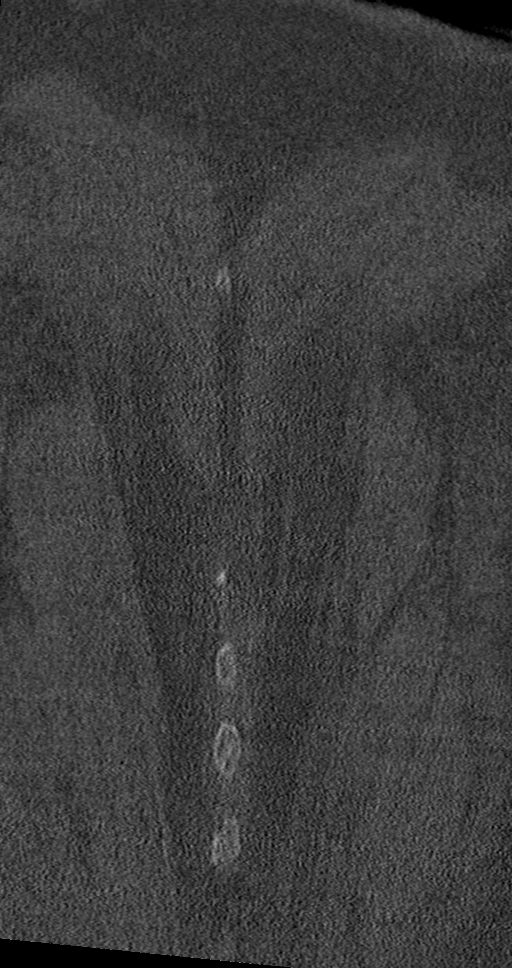
[im 25/61  bone]
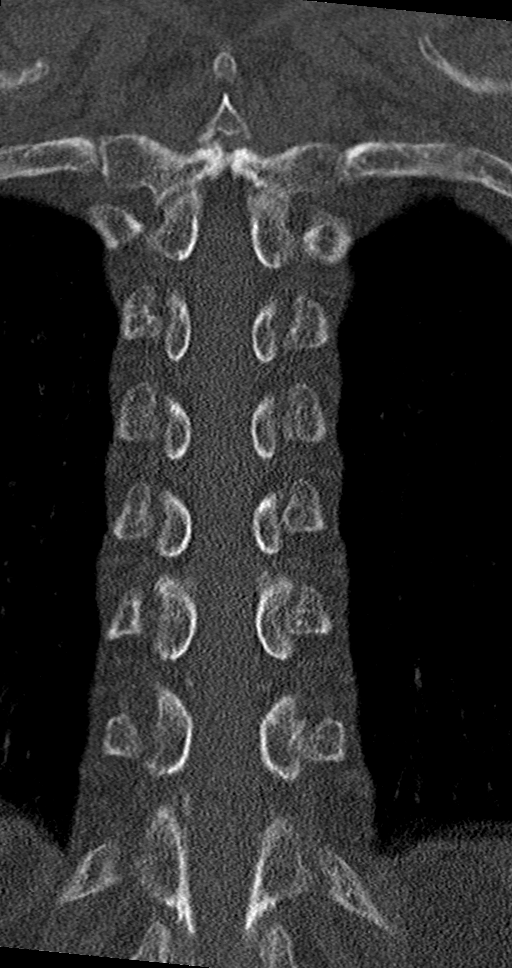
[im 37/61  bone]
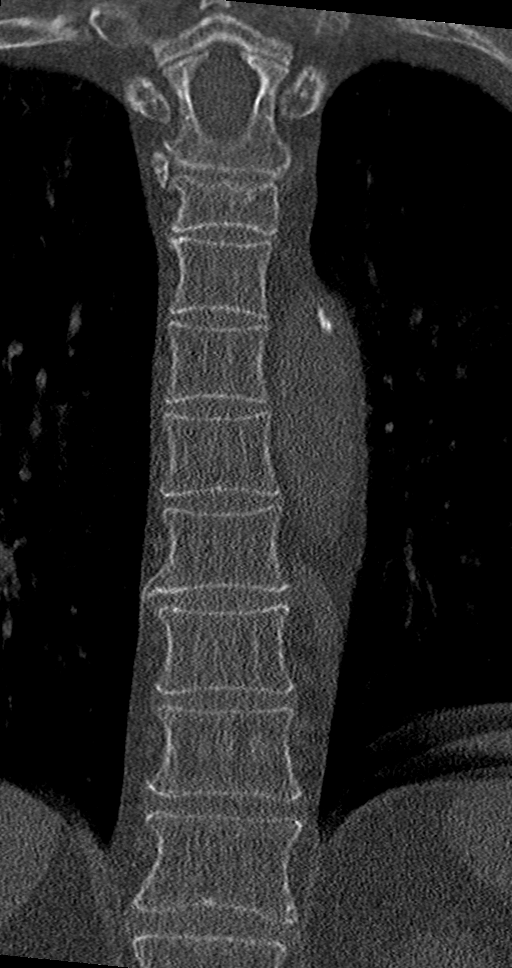

[12 of 33 positions shown; findings below may reference images not displayed]

FINDINGS: CT CERVICAL SPINE FINDINGS

Alignment: Straightening of the normal cervical lordosis. Trace
anterolisthesis of C7 on T1, likely chronic and facet mediated. No
malalignment.

Skull base and vertebrae: Skull base intact. Normal C1-2
articulations are preserved in the dens is intact. Vertebral body
heights maintained. No acute fracture.

Soft tissues and spinal canal: Soft tissues of the neck demonstrate
no acute finding. No abnormal prevertebral edema. Spinal canal
within normal limits. Vascular calcifications about the carotid
bifurcations.

Disc levels: Prominent degenerative changes noted about the C1-2
articulation. Mild cervical spondylosis noted at C4-5 through C6-7
without significant stenosis. Moderate multilevel left-sided facet
hypertrophy.

Upper chest: Visualized upper chest demonstrates no acute finding.
Partially visualized lung apices are clear.

Other: None.

CT THORACIC SPINE FINDINGS

Alignment: Mild scoliosis. Alignment otherwise normal with
preservation of the normal thoracic kyphosis. No listhesis or
malalignment.

Vertebrae: Compression deformity involving what is favored to be the
T5 vertebral body with up to 50% height loss without significant
bony retropulsion, chronic in appearance. Vertebral body height
otherwise maintained without acute fracture. No discrete or
worrisome osseous lesions. Visualized ribs are grossly intact.

Paraspinal and other soft tissues: Paraspinous soft tissues within
normal limits. Partially visualized lungs are grossly clear. Aortic
atherosclerosis.

Disc levels: No significant disc pathology seen within the thoracic
spine. No appreciable stenosis.

CT LUMBAR SPINE FINDINGS

Segmentation: Standard.

Alignment: Trace dextroscoliosis. Alignment otherwise normal with
preservation of the normal lumbar lordosis. No listhesis or
malalignment.

Vertebrae: Vertebral body height maintained without acute or chronic
fracture. Visualized sacrum and pelvis intact. SI joints
approximated symmetric. No worrisome osseous lesions.

Paraspinal and other soft tissues: Paraspinous soft tissues
demonstrate no acute finding. Aortic atherosclerosis. Diverticulosis
noted about the partially visualized sigmoid colon. Visualized
visceral structures otherwise unremarkable.

Disc levels:

L1-2:  Unremarkable.

L2-3:  Unremarkable.

L3-4: No significant disc bulge. Mild facet hypertrophy. No
stenosis.

L4-5: Minimal disc bulge. Bilateral facet hypertrophy. No
significant stenosis.

L5-S1: Degenerative intervertebral disc space narrowing with diffuse
disc bulge and reactive endplate change. Moderate facet hypertrophy.
No significant spinal stenosis. Mild bilateral L5 foraminal
narrowing.
IMPRESSION: 1. No acute traumatic injury within the cervical, thoracic, or
lumbar spine.
2. Chronic compression deformity of T5 with up to 50% height loss
without bony retropulsion.
3. Degenerative spondylosis at L5-S1 with resultant mild bilateral
L5 foraminal stenosis.
4.  Aortic Atherosclerosis ([VQ]-[VQ]).

## 2019-11-23 IMAGING — CT CT L SPINE W/O CM
3 of 4 series · 12 of 33 positions shown, 14 images · non-contrast
Comparison: Prior CT from [DATE].

CLINICAL DATA: Initial evaluation for weakness, fall, mid back
pain.

EXAM:
CT CERVICAL, THORACIC, AND LUMBAR SPINE WITHOUT CONTRAST
TECHNIQUE: Multidetector CT imaging of the cervical, thoracic and lumbar spine
was performed without intravenous contrast. Multiplanar CT image
reconstructions were also generated.

[Series 5: sagittal bone · sagittal · 0.35mm/px · 5 of 61 slices shown, 6 images]
[im 21/61  bone]
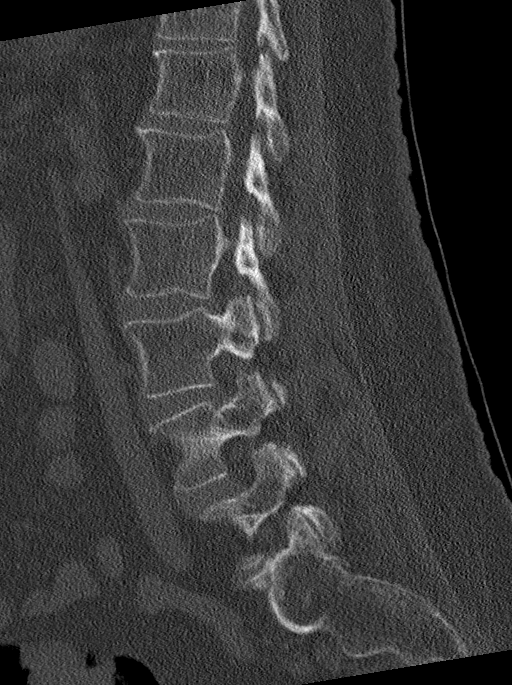
[im 26/61  bone]
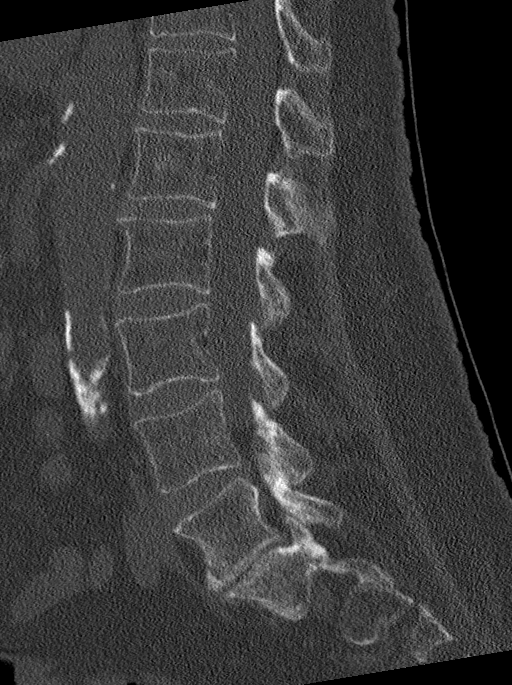
[im 31/61  soft-tissue]
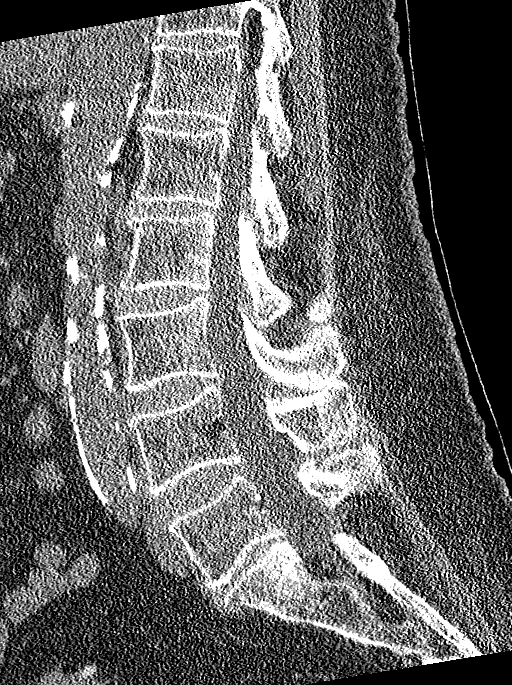
[im 31/61  bone]
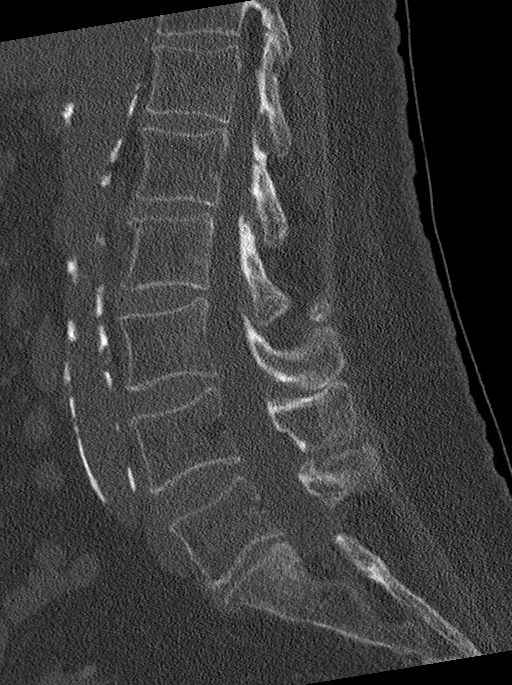
[im 36/61  bone]
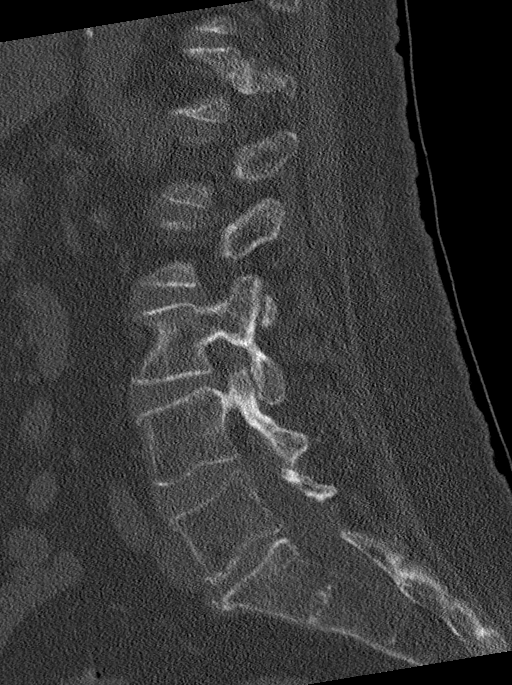
[im 41/61  bone]
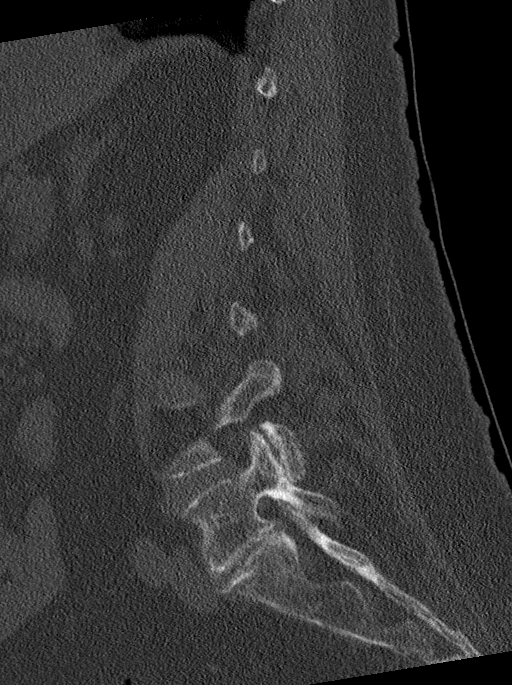

[Series 6: coronal bone · coronal · 0.35mm/px · 3 of 66 slices shown]
[im 14/66  bone]
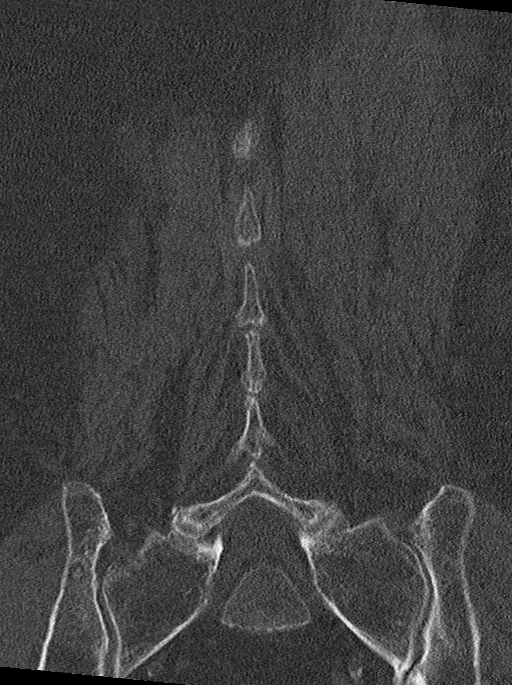
[im 27/66  bone]
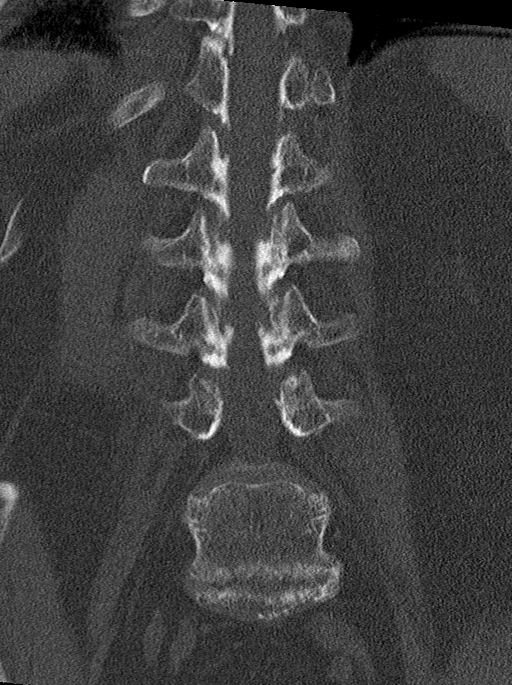
[im 40/66  bone]
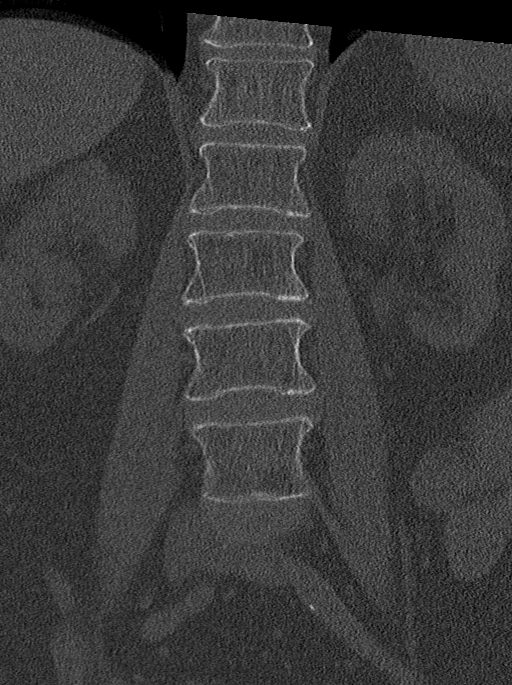

[Series 7: ax disc · axial · 0.21mm/px · z∈[+696,+865]mm · 4 of 79 slices shown, 5 images]
[im 16/79  soft-tissue]
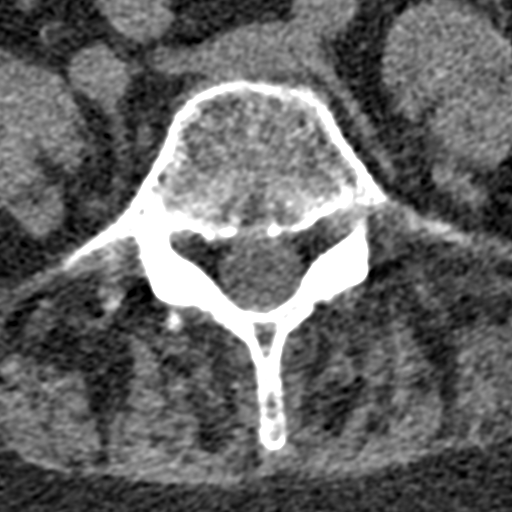
[im 16/79  bone]
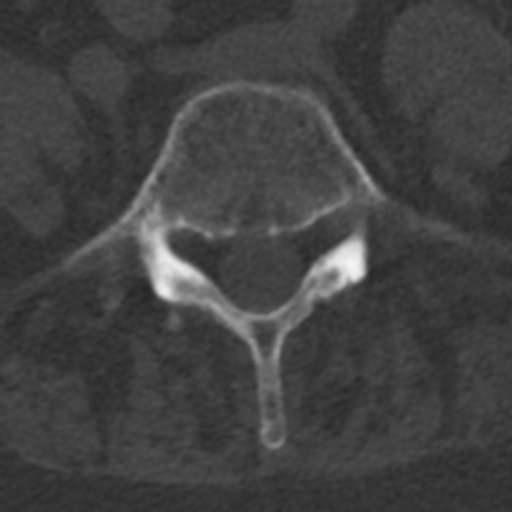
[im 32/79  bone]
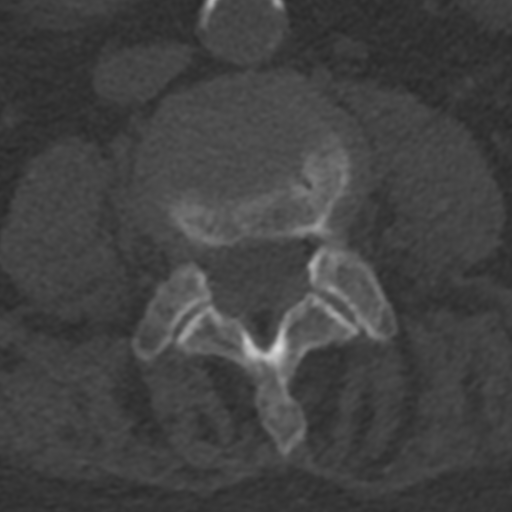
[im 47/79  bone]
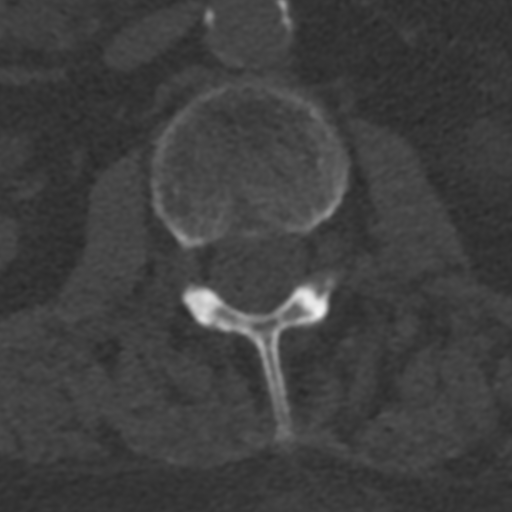
[im 63/79  bone]
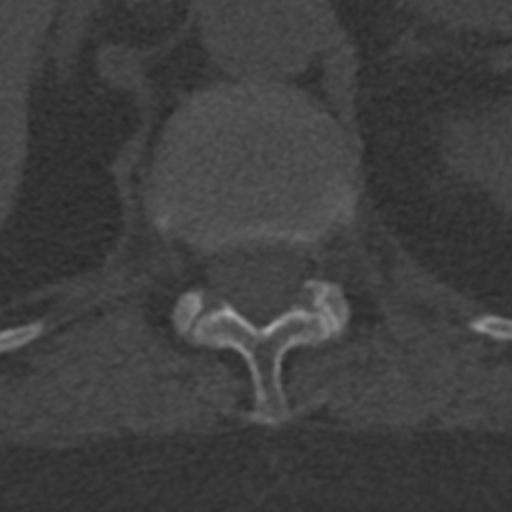

[12 of 33 positions shown; findings below may reference images not displayed]

FINDINGS: CT CERVICAL SPINE FINDINGS

Alignment: Straightening of the normal cervical lordosis. Trace
anterolisthesis of C7 on T1, likely chronic and facet mediated. No
malalignment.

Skull base and vertebrae: Skull base intact. Normal C1-2
articulations are preserved in the dens is intact. Vertebral body
heights maintained. No acute fracture.

Soft tissues and spinal canal: Soft tissues of the neck demonstrate
no acute finding. No abnormal prevertebral edema. Spinal canal
within normal limits. Vascular calcifications about the carotid
bifurcations.

Disc levels: Prominent degenerative changes noted about the C1-2
articulation. Mild cervical spondylosis noted at C4-5 through C6-7
without significant stenosis. Moderate multilevel left-sided facet
hypertrophy.

Upper chest: Visualized upper chest demonstrates no acute finding.
Partially visualized lung apices are clear.

Other: None.

CT THORACIC SPINE FINDINGS

Alignment: Mild scoliosis. Alignment otherwise normal with
preservation of the normal thoracic kyphosis. No listhesis or
malalignment.

Vertebrae: Compression deformity involving what is favored to be the
T5 vertebral body with up to 50% height loss without significant
bony retropulsion, chronic in appearance. Vertebral body height
otherwise maintained without acute fracture. No discrete or
worrisome osseous lesions. Visualized ribs are grossly intact.

Paraspinal and other soft tissues: Paraspinous soft tissues within
normal limits. Partially visualized lungs are grossly clear. Aortic
atherosclerosis.

Disc levels: No significant disc pathology seen within the thoracic
spine. No appreciable stenosis.

CT LUMBAR SPINE FINDINGS

Segmentation: Standard.

Alignment: Trace dextroscoliosis. Alignment otherwise normal with
preservation of the normal lumbar lordosis. No listhesis or
malalignment.

Vertebrae: Vertebral body height maintained without acute or chronic
fracture. Visualized sacrum and pelvis intact. SI joints
approximated symmetric. No worrisome osseous lesions.

Paraspinal and other soft tissues: Paraspinous soft tissues
demonstrate no acute finding. Aortic atherosclerosis. Diverticulosis
noted about the partially visualized sigmoid colon. Visualized
visceral structures otherwise unremarkable.

Disc levels:

L1-2:  Unremarkable.

L2-3:  Unremarkable.

L3-4: No significant disc bulge. Mild facet hypertrophy. No
stenosis.

L4-5: Minimal disc bulge. Bilateral facet hypertrophy. No
significant stenosis.

L5-S1: Degenerative intervertebral disc space narrowing with diffuse
disc bulge and reactive endplate change. Moderate facet hypertrophy.
No significant spinal stenosis. Mild bilateral L5 foraminal
narrowing.
IMPRESSION: 1. No acute traumatic injury within the cervical, thoracic, or
lumbar spine.
2. Chronic compression deformity of T5 with up to 50% height loss
without bony retropulsion.
3. Degenerative spondylosis at L5-S1 with resultant mild bilateral
L5 foraminal stenosis.
4.  Aortic Atherosclerosis ([VQ]-[VQ]).

## 2019-11-23 MED ORDER — TRAMADOL HCL 50 MG PO TABS
100.0000 mg | ORAL_TABLET | Freq: Every day | ORAL | Status: DC | PRN
  Administered 2019-11-23 – 2019-11-26 (×3): 100 mg via ORAL
  Filled 2019-11-23 (×4): qty 2

## 2019-11-23 MED ORDER — DOCUSATE SODIUM 100 MG PO CAPS
100.0000 mg | ORAL_CAPSULE | Freq: Two times a day (BID) | ORAL | Status: DC
  Administered 2019-11-23 – 2019-12-10 (×35): 100 mg via ORAL
  Filled 2019-11-23 (×35): qty 1

## 2019-11-23 MED ORDER — ACETAMINOPHEN 325 MG PO TABS
650.0000 mg | ORAL_TABLET | Freq: Four times a day (QID) | ORAL | Status: DC | PRN
  Administered 2019-11-23 – 2019-11-29 (×2): 650 mg via ORAL
  Filled 2019-11-23 (×2): qty 2

## 2019-11-23 MED ORDER — ATORVASTATIN CALCIUM 20 MG PO TABS
20.0000 mg | ORAL_TABLET | Freq: Every day | ORAL | Status: DC
  Administered 2019-11-23 – 2019-12-09 (×17): 20 mg via ORAL
  Filled 2019-11-23 (×17): qty 1

## 2019-11-23 MED ORDER — ASPIRIN 300 MG RE SUPP
300.0000 mg | Freq: Every day | RECTAL | Status: DC
  Filled 2019-11-23: qty 1

## 2019-11-23 MED ORDER — ENOXAPARIN SODIUM 40 MG/0.4ML ~~LOC~~ SOLN
40.0000 mg | SUBCUTANEOUS | Status: DC
  Administered 2019-11-23 – 2019-12-10 (×18): 40 mg via SUBCUTANEOUS
  Filled 2019-11-23 (×18): qty 0.4

## 2019-11-23 MED ORDER — METOPROLOL TARTRATE 50 MG PO TABS
25.0000 mg | ORAL_TABLET | Freq: Two times a day (BID) | ORAL | Status: DC
  Administered 2019-11-23 – 2019-12-10 (×35): 25 mg via ORAL
  Filled 2019-11-23 (×35): qty 1

## 2019-11-23 MED ORDER — PANTOPRAZOLE SODIUM 40 MG PO TBEC
40.0000 mg | DELAYED_RELEASE_TABLET | Freq: Every day | ORAL | Status: DC
  Administered 2019-11-23: 40 mg via ORAL
  Filled 2019-11-23: qty 1

## 2019-11-23 MED ORDER — NORTRIPTYLINE HCL 25 MG PO CAPS
25.0000 mg | ORAL_CAPSULE | Freq: Every day | ORAL | Status: DC
  Administered 2019-11-23 – 2019-12-09 (×17): 25 mg via ORAL
  Filled 2019-11-23 (×17): qty 1

## 2019-11-23 MED ORDER — ONDANSETRON HCL 4 MG PO TABS
4.0000 mg | ORAL_TABLET | Freq: Three times a day (TID) | ORAL | Status: DC | PRN
  Administered 2019-11-23 – 2019-12-06 (×3): 4 mg via ORAL
  Filled 2019-11-23 (×3): qty 1

## 2019-11-23 MED ORDER — PANTOPRAZOLE SODIUM 40 MG IV SOLR
40.0000 mg | INTRAVENOUS | Status: DC
  Administered 2019-11-23 – 2019-11-30 (×8): 40 mg via INTRAVENOUS
  Filled 2019-11-23 (×8): qty 40

## 2019-11-23 MED ORDER — CALCIUM CARBONATE-VITAMIN D 500-200 MG-UNIT PO TABS
1.0000 | ORAL_TABLET | Freq: Every day | ORAL | Status: DC
  Administered 2019-11-23 – 2019-12-10 (×18): 1 via ORAL
  Filled 2019-11-23 (×21): qty 1

## 2019-11-23 MED ORDER — ASPIRIN 325 MG PO TABS
325.0000 mg | ORAL_TABLET | Freq: Every day | ORAL | Status: DC
  Administered 2019-11-23 – 2019-11-26 (×4): 325 mg via ORAL
  Filled 2019-11-23 (×4): qty 1

## 2019-11-23 MED ORDER — STROKE: EARLY STAGES OF RECOVERY BOOK
Freq: Once | Status: AC
  Filled 2019-11-23: qty 1

## 2019-11-23 MED ORDER — GABAPENTIN 100 MG PO CAPS: 200.0000 mg | ORAL_CAPSULE | Freq: Every day | ORAL | Status: DC

## 2019-11-23 MED ORDER — CHOLECALCIFEROL 10 MCG (400 UNIT) PO TABS
400.0000 [IU] | ORAL_TABLET | Freq: Every day | ORAL | Status: DC
  Administered 2019-11-23 – 2019-12-10 (×18): 400 [IU] via ORAL
  Filled 2019-11-23 (×21): qty 1

## 2019-11-23 MED ORDER — SODIUM CHLORIDE 0.9 % IV SOLN: Freq: Once | INTRAVENOUS | Status: AC

## 2019-11-23 MED ORDER — METHYLPREDNISOLONE SODIUM SUCC 40 MG IJ SOLR
40.0000 mg | INTRAMUSCULAR | Status: DC
  Administered 2019-11-23 – 2019-11-25 (×3): 40 mg via INTRAVENOUS
  Filled 2019-11-23 (×3): qty 1

## 2019-11-23 MED ORDER — LEVOTHYROXINE SODIUM 88 MCG PO TABS
88.0000 ug | ORAL_TABLET | Freq: Every day | ORAL | Status: DC
  Administered 2019-11-24 – 2019-12-10 (×17): 88 ug via ORAL
  Filled 2019-11-23 (×21): qty 1

## 2019-11-23 MED ORDER — GABAPENTIN 100 MG PO CAPS
200.0000 mg | ORAL_CAPSULE | Freq: Two times a day (BID) | ORAL | Status: DC
  Administered 2019-11-23 – 2019-11-25 (×5): 200 mg via ORAL
  Filled 2019-11-23 (×5): qty 2

## 2019-11-23 NOTE — Progress Notes (Signed)
  Echocardiogram 2D Echocardiogram has been performed.  Jennette Dubin 11/23/2019, 1:09 PM

## 2019-11-23 NOTE — H&P (Signed)
History and Physical  Christina Fry MRN:[id number] DOB: 07/09/1940 DOA: 11/22/2019  Referring physician: Delo, Douglas, MD PCP: D'Silva, Marisa R, MD  Patient coming from: Home  Chief Complaint: Generalized weakness  HPI: Christina Fry is a 79 y.o. female with medical history significant for PMR, fibromyalgia, GERD, hypertension, hypothyroidism who presents to the emergency department due to generalized weakness and fall sustained at home.  She complained of 2-day onset of sudden increase in shaking episodes, this was associated with generalized tender points in her back and lower extremities.  Patient ambulates with a walker at baseline, and she complained of difficulty in being able to ambulate within last 2 days, an attempt to ambulate resulted in her fall due to difficulty in being able to lift her feet from the ground due to weakness.  She went to an urgent care, but since the result for blood work would not be available for 2-4 days, she was going [facility], but decided to go to ED for further evaluation due to the fall. Patient endorsed recent severe coughing fit with resultant increased back pain, however, this would not explain the shakes in her arms and legs.  She states that she had similar presentation several years ago and ended up being in LTAC for intensive rehab, prior to being able to regain ability to ambulate.  Patient did complain of blurry vision last night when she was going to text her daughter and she ended up dictating her text.  ED Course:  In the emergency department, she was hemodynamically stable.  Work-up in the ED showed normal CBC and BMP, SARS coronavirus 2 was negative TSH, troponin, ammonia, hepatic function panel were all normal.  CT without contrast showed no acute intracranial abnormality.  Chest x-ray showed emphysematous and chronic bronchitis changes in the lungs with no evidence of active pulmonary disease.  Degenerative changes in the spine  and shoulders as well as old bilateral rib fractures were noted.  IV hydration of 500 mL NS was given.  Hospitalist was asked to admit her for further evaluation and management.  Review of Systems: Constitutional: Negative for chills and fever.  HENT: Negative for ear pain and sore throat.   Eyes: Negative for pain and visual disturbance.  Respiratory: Positive for cough,  negative for chest tightness and shortness of breath.   Cardiovascular: Negative for chest pain and palpitations.  Gastrointestinal: Negative for abdominal pain and vomiting.  Endocrine: Negative for polyphagia and polyuria.  Genitourinary: Negative for decreased urine volume, dysuria Musculoskeletal: Negative for arthralgias and back pain.  Skin: Negative for color change and rash.  Allergic/Immunologic: Negative for immunocompromised state.  Neurological: Positive for  weakness,shakiness in her arms and legs as well as pain throughout the muscles of her shoulders and thighs.  Negative for syncope, speech difficulty Hematological: Does not bruise/bleed easily.  All other systems reviewed and are negative   Past Medical History:  Diagnosis Date  . Anxiety   . Cancer (HCC)   . Diabetes mellitus without complication (HCC)   . Fibromyalgia   . GERD (gastroesophageal reflux disease)   . Grave's disease   . Herpes   . Hyperlipidemia   . Mild intermittent asthma without complication 04/04/2018  . Palpitation   . Polymyalgia rheumatica (HCC)    Past Surgical History:  Procedure Laterality Date  . ABDOMINAL HYSTERECTOMY    . APPENDECTOMY    . CARPAL TUNNEL RELEASE    . HERNIA REPAIR    .   INCISIONAL HERNIA REPAIR  03/20/2012   Procedure: LAPAROSCOPIC INCISIONAL HERNIA;  Surgeon: Jamesetta So, MD;  Location: [facility];  Service: General;  Laterality: N/A;  . INSERTION OF MESH  03/20/2012   Procedure: INSERTION OF MESH;  Surgeon: Jamesetta So, MD;  Location: [facility];  Service: General;  Laterality: N/A;  . TOTAL  ABDOMINAL HYSTERECTOMY W/ BILATERAL SALPINGOOPHORECTOMY      Social History:  reports that she has never smoked. She has never used smokeless tobacco. She reports that she does not drink alcohol and does not use drugs.   Allergies  Allergen Reactions  . Iohexol Shortness Of Breath    Pt stopped breathing at [place]. Pt refuses IV dye  . Codeine Itching  . Morphine And Related Itching  . Nitrofuran Derivatives Other (See Comments)    Unknown  . Oxycodone-Acetaminophen Itching    Family History  Problem Relation Age of Onset  . Allergic rhinitis Neg Hx   . Asthma Neg Hx   . Eczema Neg Hx   . Urticaria Neg Hx   . Angioedema Neg Hx   . Atopy Neg Hx   . Immunodeficiency Neg Hx     Prior to Admission medications   Medication Sig Start Date End Date Taking? Authorizing Provider  acetaminophen (TYLENOL) 325 MG tablet Take 650 mg by mouth every 6 (six) hours as needed.    [provider]  acyclovir (ZOVIRAX) 200 MG capsule Take 400 mg by mouth 2 (two) times daily.      [provider]  Calcium Carbonate-Vitamin D (CALCIUM 600+D HIGH POTENCY) 600-400 MG-UNIT per tablet Take 1 tablet by mouth daily.      [provider]  cholecalciferol (VITAMIN D) 400 UNITS TABS Take 400 Units by mouth daily.    [provider]  Docusate Sodium 100 MG capsule Take 100 mg by mouth 2 (two) times daily.    [provider]  fluticasone (FLONASE) 50 MCG/ACT nasal spray Place 1 spray into both nostrils 2 (two) times daily. 04/05/18   Valentina Shaggy, MD  gabapentin (NEURONTIN) 100 MG capsule Take 200 mg by mouth at bedtime.      [provider]  HYDROcodone-acetaminophen (NORCO/VICODIN) 5-325 MG tablet Take 1 tablet by mouth every 6 (six) hours as needed for moderate pain. 12/24/18   Milton Ferguson, MD  levothyroxine (SYNTHROID, LEVOTHROID) 88 MCG tablet Take 88 mcg by mouth daily before breakfast.    [provider]  loratadine (CLARITIN)  10 MG tablet Take 10 mg by mouth daily.    [provider]  metoprolol tartrate (LOPRESSOR) 25 MG tablet Take 25 mg by mouth 2 (two) times daily.      [provider]  nortriptyline (PAMELOR) 25 MG capsule Take 25 mg by mouth at bedtime.      [provider]  omeprazole (PRILOSEC) 20 MG capsule Take 40 mg by mouth 2 (two) times daily.      [provider]    Physical Exam: BP (!) 175/63   Pulse 90   Temp 98.5 F (36.9 C) (Oral)   Resp 18   Ht 5' 3" (1.6 m)   Wt 73 kg   SpO2 99%   BMI 28.52 kg/m   . General: 79 y.o. year-old female well developed well nourished in no acute distress.  Alert and oriented x3. Marland Kitchen HEENT: NCAT, EOMI, dry mucous membrane . Neck: Supple, trachea midline . Cardiovascular: Regular rate and rhythm with no rubs or gallops.  No thyromegaly or JVD noted.  No lower extremity edema. 2/4 pulses in all 4 extremities. . Respiratory: Clear to auscultation with no wheezes or rales. Good inspiratory effort. . Abdomen: Soft nontender nondistended with normal bowel sounds x4 quadrants. . Muskuloskeletal: Multiple tender points noted in thoracolumbar area on palpation.  No cyanosis, clubbing or edema noted bilaterally . Neuro: CN II-XII intact, decreased strength throughout all extremities, difficulty in being able to complete finger-to-nose movement due to extreme shakiness.  . Skin: No ulcerative lesions noted or rashes . Psychiatry: Judgement and insight appear normal. Mood is appropriate for condition and setting          Labs on Admission:  Basic Metabolic Panel: Recent Labs  Lab 11/22/19 2020  NA 137  K 3.8  CL 101  CO2 26  GLUCOSE 109*  BUN 14  CREATININE 1.09*  CALCIUM 9.6   Liver Function Tests: Recent Labs  Lab 11/22/19 2349  AST 16  ALT 10  ALKPHOS 78  BILITOT 0.6  PROT 6.9  ALBUMIN 3.8   No results for input(s): LIPASE, AMYLASE in the last 168 hours. Recent Labs  Lab 11/22/19 2350  AMMONIA 12    CBC: Recent Labs  Lab 11/22/19 2020  WBC 10.4  HGB 13.0  HCT 41.1  MCV 97.4  PLT 348   Cardiac Enzymes: Recent Labs  Lab 11/22/19 2349  CKTOTAL 40    BNP (last 3 results) No results for input(s): BNP in the last 8760 hours.  ProBNP (last 3 results) No results for input(s): PROBNP in the last 8760 hours.  CBG: No results for input(s): GLUCAP in the last 168 hours.  Radiological Exams on Admission: CT Head Wo Contrast  Result Date: 11/22/2019 CLINICAL DATA:  Generalized weakness. EXAM: CT HEAD WITHOUT CONTRAST TECHNIQUE: Contiguous axial images were obtained from the base of the skull through the vertex without intravenous contrast. COMPARISON:  None. FINDINGS: Brain: There is mild cerebral atrophy with widening of the extra-axial spaces and ventricular dilatation. There are areas of decreased attenuation within the white matter tracts of the supratentorial brain, consistent with microvascular disease changes. Vascular: No hyperdense vessel or unexpected calcification. Skull: Negative for acute fracture or focal lesion. Sinuses/Orbits: Chronic deformities are seen along the medial walls of the bilateral orbits. The paranasal sinuses are otherwise normal in appearance. Other: None. IMPRESSION: 1. Generalized cerebral atrophy. 2. No acute intracranial abnormality. Electronically Signed   By: Thaddeus  Houston M.D.   On: 11/22/2019 23:52   DG Chest Port 1 View  Result Date: 11/23/2019 CLINICAL DATA:  Generalized weakness. EXAM: PORTABLE CHEST 1 VIEW COMPARISON:  04/01/2014 FINDINGS: Heart size and pulmonary vascularity are normal. Emphysematous changes in the lungs. Peribronchial thickening and streaky perihilar opacities suggesting chronic bronchitis. No focal consolidation. No pleural effusions. No pneumothorax. Mediastinal contours appear intact. Degenerative changes in the spine and shoulders. Old bilateral rib fractures. IMPRESSION: Emphysematous and chronic bronchitic changes in  the lungs. No evidence of active pulmonary disease. Electronically Signed   By: William  Stevens M.D.   On: 11/23/2019 00:17    EKG: I independently viewed the EKG done and my findings are as followed: Sinus rhythm with occasional PVCs at rate of 97 bpm  Assessment/Plan Present on Admission: **None**  Principal Problem:   Generalized weakness Active Problems:   PMR (polymyalgia rheumatica) (HCC)   GERD (gastroesophageal reflux disease)   Fibromyalgia   Essential hypertension   Hypothyroidism   Ambulatory dysfunction   Accident due to mechanical fall without injury     Generalized weakness with ambulatory dysfunction Mechanical fall in the setting of above Patient complaining of 2-day onset of generalized weakness with multiple tender points in the back, leg pain, thigh pain and this is associated with excessive shaking of hands (has mild tremors at baseline). TSH and CK were normal, ESR pending Chest x-ray showed degenerative changes in the spine and shoulders as well as old bilateral rib fractures were noted.   Cause of current symptoms unknown at this time, patient's history of PMR/fibromyalgia will be  differential diagnosis Continue Tylenol 650 mg p.o. every 6 hours as needed CT cervical, lumbar and thoracic spine showed no acute traumatic injury within the cervical, thoracic, or lumbar spine, but showed chronic compression deformity of T5 with up to 50% height loss without bony retropulsion and degenerative spondylosis at L5-S1 with resultant mild bilateral L5 foraminal stenosis. continue fall precaution and neurochecks Continue PT and OT eval and treat  History of polymyalgia rheumatica ESR pending, patient may require prednisone based on findings.  Fibromyalgia Continue gabapentin per home regimen  Dehydration Continue IV hydration  Essential hypertension Continue Lopressor per home regimen  Hypothyroidism Continue Synthroid per home regimen  GERD Continue  Protonix  DVT prophylaxis: Lovenox  Code Status: Full code  Family Communication: None at bedside  Disposition Plan:  Patient is from:                        home Anticipated DC to:                   SNF or family members home Anticipated DC date:               2-3 days Anticipated DC barriers:         Patient is unstable to discharge at this time due to generalized weakness and ambulatory difficulty requiring further work-up as well as PT/OT eval and treat and recommendation.  Consults called: None  Admission status: Observation    Oladapo Adefeso MD [facility]  If 7PM-7AM, please contact night-coverage [email or web address] Password [email or web address]  11/23/2019, 4:59 AM   

## 2019-11-23 NOTE — Plan of Care (Signed)
  Problem: Acute Rehab PT Goals(only PT should resolve) Goal: Pt Will Go Supine/Side To Sit Outcome: Progressing Flowsheets (Taken 11/23/2019 1356) Pt will go Supine/Side to Sit:  with supervision  with modified independence Goal: Patient Will Transfer Sit To/From Stand Outcome: Progressing Flowsheets (Taken 11/23/2019 1356) Patient will transfer sit to/from stand: with minimal assist Goal: Pt Will Transfer Bed To Chair/Chair To Bed Outcome: Progressing Flowsheets (Taken 11/23/2019 1356) Pt will Transfer Bed to Chair/Chair to Bed: with min assist Goal: Pt Will Ambulate Outcome: Progressing Flowsheets (Taken 11/23/2019 1356) Pt will Ambulate:  25 feet  with minimal assist  with rolling walker   1:56 PM, 11/23/19 Lonell Grandchild, MPT Physical Therapist with [facility] [phone] office [phone] mobile phone

## 2019-11-23 NOTE — Evaluation (Signed)
Physical Therapy Evaluation Patient Details Name: Christina Fry MRN: [id number] DOB: 08/31/1940 Today's Date: 11/23/2019   History of Present Illness  Christina Fry is a 79 y.o. female with medical history significant for PMR, fibromyalgia, GERD, hypertension, hypothyroidism who presents to the emergency department due to generalized weakness and fall sustained at home.  She complained of 2-day onset of sudden increase in shaking episodes, this was associated with generalized tender points in her back and lower extremities.  Patient ambulates with a walker at baseline, and she complained of difficulty in being able to ambulate within last 2 days, an attempt to ambulate resulted in her fall due to difficulty in being able to lift her feet from the ground due to weakness.  She went to an urgent care, but since the result for blood work would not be available for 2-4 days, [facility], but decided to go to ED for further evaluation due to the fall.Patient endorsed recent severe coughing fit with resultant increased back pain, however, this would not explain the shakes in her arms and legs.  She states that she had similar presentation several years ago and ended up being in [facility] for intensive rehab, prior to being able to regain ability to ambulate.  Patient did complain of blurry vision last night when she was going to text her daughter and she ended up dictating her text.    Clinical Impression  Patient demonstrates labored movement for sitting up at bedside, has difficulty flexing knees due bilateral anterior quadriceps pain requiring manual assistance to flex knees before completing sit to stands, limited to a few unsteady labored side steps with occasional buckling of knees and tolerated sitting up in chair after therapy - nursing staff notified.  Patient will benefit from continued physical therapy in hospital and recommended venue below to increase strength, balance,  endurance for safe ADLs and gait.    Follow Up Recommendations SNF    Equipment Recommendations  None recommended by PT    Recommendations for Other Services       Precautions / Restrictions Precautions Precautions: Fall Restrictions Weight Bearing Restrictions: No      Mobility  Bed Mobility Overal bed mobility: Needs Assistance Bed Mobility: Supine to Sit     Supine to sit: HOB elevated;Min guard     General bed mobility comments: increased time, labored movement  Transfers Overall transfer level: Needs assistance Equipment used: Rolling walker (2 wheeled) Transfers: Sit to/from [facility] Sit to Stand: Mod assist Stand pivot transfers: Mod assist       General transfer comment: has diffiuclty flexing knees due to bilateral thigh pain/discomfort, required assistance to place feet behind knees in order to complete sit to stands  Ambulation/Gait Ambulation/Gait assistance: Mod assist Gait Distance (Feet): 5 Feet Assistive device: Rolling walker (2 wheeled) Gait Pattern/deviations: Decreased step length - right;Decreased step length - left;Decreased stride length Gait velocity: decreased   General Gait Details: limited to 5-6 slow labored side steps due to BLE weakness and fall risk  Stairs            Wheelchair Mobility    Modified Rankin (Stroke Patients Only)       Balance Overall balance assessment: Needs assistance Sitting-balance support: Feet supported;No upper extremity supported Sitting balance-Leahy Scale: Fair Sitting balance - Comments: fair/good seated at EOB   Standing balance support: During functional activity;Bilateral upper extremity supported Standing balance-Leahy Scale: Poor Standing balance comment: fair/poor using RW  Pertinent Vitals/Pain Pain Assessment: Faces Faces Pain Scale: Hurts even more Pain Location: pressure to anterior thighs and lower legs Pain  Descriptors / Indicators: Grimacing;Guarding;Sore;Shooting;Discomfort Pain Intervention(s): Limited activity within patient's tolerance;Monitored during session;Repositioned    Home Living Family/patient expects to be discharged to:: Private residence Living Arrangements: Alone Available Help at Discharge: Family;Available PRN/intermittently Type of Home: House Home Access: Ramped entrance     Home Layout: One level Home Equipment: [place] - 4 wheels;Cane - single point;Shower seat;Bedside commode Additional Comments: uses adjustable bed    Prior Function Level of Independence: Independent with assistive device(s);Needs assistance   Gait / Transfers Assistance Needed: household ambulator using Rollator  ADL's / Homemaking Assistance Needed: family assist with community ADLs        Hand Dominance        Extremity/Trunk Assessment   Upper Extremity Assessment Upper Extremity Assessment: Generalized weakness    Lower Extremity Assessment Lower Extremity Assessment: Generalized weakness    Cervical / Trunk Assessment Cervical / Trunk Assessment: Normal  Communication   Communication: No difficulties  Cognition Arousal/Alertness: Awake/alert Behavior During Therapy: WFL for tasks assessed/performed Overall Cognitive Status: Within Functional Limits for tasks assessed                                        General Comments      Exercises     Assessment/Plan    PT Assessment Patient needs continued PT services  PT Problem List Decreased strength;Decreased activity tolerance;Decreased balance;Decreased mobility;Pain       PT Treatment Interventions Balance training;Gait training;Stair training;Functional mobility training;Therapeutic activities;Therapeutic exercise;Patient/family education;DME instruction    PT Goals (Current goals can be found in the Care Plan section)  Acute Rehab PT Goals Patient Stated Goal: return home after rehab PT Goal  Formulation: With patient Time For Goal Achievement: 12/07/19 Potential to Achieve Goals: Good    Frequency Min 3X/week   Barriers to discharge        Co-evaluation               AM-PAC PT "6 Clicks" Mobility  Outcome Measure Help needed turning from your back to your side while in a flat bed without using bedrails?: A Little Help needed moving from lying on your back to sitting on the side of a flat bed without using bedrails?: A Little Help needed moving to and from a bed to a chair (including a wheelchair)?: A Lot Help needed standing up from a chair using your arms (e.g., wheelchair or bedside chair)?: A Lot Help needed to walk in hospital room?: A Lot Help needed climbing 3-5 steps with a railing? : Total 6 Click Score: 13    End of Session   Activity Tolerance: Patient tolerated treatment well;Patient limited by fatigue Patient left: in chair;with call bell/phone within reach Nurse Communication: Mobility status PT Visit Diagnosis: Unsteadiness on feet (R26.81);Other abnormalities of gait and mobility (R26.89);Muscle weakness (generalized) (M62.81)    Time: 8416-6063 PT Time Calculation (min) (ACUTE ONLY): 25 min   Charges:   PT Evaluation $PT Eval Moderate Complexity: 1 Mod PT Treatments $Therapeutic Activity: 23-37 mins        1:54 PM, 11/23/19 Lonell Grandchild, MPT Physical Therapist with [facility] [phone] office [phone] mobile phone

## 2019-11-23 NOTE — Progress Notes (Signed)
Alert and oriented.  Able to follow commands and speech is clear.  States she has some trouble finding words at times but when conversing seems to talk with no difficulty.  Lower extremities no resistance against gravity, but has full sensation.  Presented [facility] literature and gave copy.   Daughter has been at bedside.  Unable to obtain stroke booklet due to pharmacy closed.

## 2019-11-23 NOTE — ED Notes (Signed)
Drop noted to left eye.Pt says left eye was fine yesterday at 11 am.  Pt says there is a difference this am.

## 2019-11-23 NOTE — Progress Notes (Signed)
PROGRESS NOTE  Christina Fry  DOB: [birth year]  PCP: Ferdie Ping, MD FBP:[id number]  DOA: 11/22/2019  LOS: 0 days   Chief Complaint  Patient presents with  . Weakness   Brief narrative: Christina Fry is a 79 y.o. female with PMH of polymyalgia rheumatica, fibromyalgia, GERD, hypertension, hypothyroidism. Patient presented to the ED on 11/22/2019 with progressively worsening generalized weakness, shakiness and multiple tender points in the body for last few days culminating to a fall on 9/10. At baseline, patient ambulates with a walker but she has been avoiding ambulating last few days in fear of falling.  She also complains of worsening pain and swelling of both lower extremities and also complains of body pain and back pain even on coughing.  Since the night before, patient also has blurring of vision on the left eye. She has a history of polymyalgia rheumatica with an acute flare of 10 years ago, ended up in an LTAC for intensive rehab.  She improved with prednisone and had to take it for 3 to 4 years and gradually tapered off it. [facility]  In the ED, patient was hemodynamically stable. Labs unremarkable.   CT scan of head, cervical spine, thoracic spine, lumbar spine did not show any acute abnormality.  Chronic T5 compression fracture was noted.   Chest x-ray showed emphysematous and chronic bronchitis changes in the lungs with no evidence of active pulmonary disease.  Patient was admitted under hospitalist service for further evaluation management  Subjective: Patient was seen and examined this morning.  Pleasant elderly Caucasian female.  Lying down in bed.  Not in distress. Her daughter/POA Ms. [name] was on the phone.  Assessment/Plan: Acute neurological symptoms Cerebellar stroke versus flareup of PMR -Presented with few days of progressively worsening generalized weakness, shakiness and blurring of vision and a fall -On examination, she has generalized  weakness, limited mobility of joints because of pain.  Intentional tremors present on both hands.  -CT scanning of head and spine negative for acute findings.  -Need to rule out cerebellar stroke with an MRI brain.  Not available at [facility] hospital on weekend. Case discussed with neurologist Dr. Rory Percy. Transfer to [facility]. -Stroke work-up ordered including echocardiogram, carotid duplex, PT/OT/ST eval, hemoglobin A1c, lipid panel, TSH.   -If significant findings on MRI, consider a formal neurology consultation at [facility]. -Aspirin 325 mg daily ordered for now.   Polymyalgia rheumatica -She has a history of polymyalgia rheumatica with an acute flare of 10 years ago, ended up in an LTAC for intensive rehab.  She improved with prednisone and had to take it for 3 to 4 years and gradually tapered off it. -On exam, she has significant swelling and tenderness in both lower extremities.  She recalls similar symptoms with last flareup. -For now I will start her on IV Solu-Medrol 40 mg daily. -Continue to monitor symptoms. -Continue Tylenol 650 mg p.o. every 6 hours as needed for pain -PT eval.  Fall precautions.  Bilateral lower extremity swelling and pain -Suspect secondary to PMR.  However need to rule out DVT.  Ultrasound duplex ordered.  Fibromyalgia -Continue gabapentin per home regimen  Dehydration -Gentle IV hydration given in the ED.  Encourage oral intake  Essential hypertension -Continue Lopressor per home regimen  Hypothyroidism -Continue Synthroid per home regimen  GERD -Continue Protonix  Mobility: PT eval pending Code Status:   Code Status: Full Code  Nutritional status: Body mass index is 28.52 kg/m.  Diet Order            Diet NPO time specified  Diet effective now               Okay to allow cardiac diet if passed swallow eval  DVT prophylaxis: enoxaparin (LOVENOX) injection 40 mg Start: 11/23/19 1000 SCDs Start: 11/23/19 0323   Antimicrobials:  None Fluid:  None Consultants: Neurology on the phone. Family Communication:  Daughter on the phone  Status is: Observation  The patient will require care spanning > 2 midnights and should be moved to inpatient because: Needs further studies, transfer to higher center for test and consultation  Dispo: The patient is from: Home              Anticipated d/c is to: Home hopefully, pending PT eval              Anticipated d/c date is: > 3 days              Patient currently is not medically stable to d/c.       Infusions:    Scheduled Meds: .  stroke: mapping our early stages of recovery book   Does not apply Once  . aspirin  300 mg Rectal Daily   Or  . aspirin  325 mg Oral Daily  . calcium-vitamin D  1 tablet Oral Q breakfast  . cholecalciferol  400 Units Oral Daily  . docusate sodium  100 mg Oral BID  . enoxaparin (LOVENOX) injection  40 mg Subcutaneous Q24H  . gabapentin  200 mg Oral BID  . levothyroxine  88 mcg Oral QAC breakfast  . methylPREDNISolone (SOLU-MEDROL) injection  40 mg Intravenous Q24H  . metoprolol tartrate  25 mg Oral BID  . pantoprazole (PROTONIX) IV  40 mg Intravenous Q24H    Antimicrobials: Anti-infectives (From admission, onward)   None      PRN meds: acetaminophen   Objective: Vitals:   11/23/19 0800 11/23/19 0830  BP: (!) 156/54 (!) 167/48  Pulse: 88 87  Resp: 14 17  Temp:    SpO2: 96% 97%    Intake/Output Summary (Last 24 hours) at 11/23/2019 0911 Last data filed at 11/23/2019 0301 Gross per 24 hour  Intake 500 ml  Output --  Net 500 ml   Filed Weights   11/22/19 1842  Weight: 73 kg   Weight change:  Body mass index is 28.52 kg/m.   Physical Exam: General exam: Appears calm and comfortable.  Not in physical distress Skin: No rashes, lesions or ulcers. HEENT: Atraumatic, normocephalic, supple neck, no obvious bleeding Lungs: Clear to auscultation bilaterally CVS: Regular rate and rhythm, no murmur GI/Abd soft, nontender, nondistended,  bowel sound present CNS: Alert, awake, oriented x3, generalized weakness and shakiness present.  Limited mobility of joints because of pain.  Psychiatry: Mood appropriate Extremities: Tender trace bilateral pedal edema  Data Review: I have personally reviewed the laboratory data and studies available.  Recent Labs  Lab 11/22/19 2020 11/23/19 0514  WBC 10.4 9.1  HGB 13.0 11.9*  HCT 41.1 38.3  MCV 97.4 96.7  PLT 348 315   Recent Labs  Lab 11/22/19 2020 11/23/19 0514  NA 137 141  K 3.8 3.5  CL 101 105  CO2 26 26  GLUCOSE 109* 100*  BUN 14 12  CREATININE 1.09* 0.97  CALCIUM 9.6 9.1  MG  --  1.8  PHOS  --  3.1    F/u labs ordered  Signed, Valissa Lyvers,  MD [facility] 11/23/2019

## 2019-11-24 LAB — CBC WITH DIFFERENTIAL/PLATELET
Abs Immature Granulocytes: 0.01 10*3/uL (ref 0.00–0.07)
Basophils Absolute: 0 10*3/uL (ref 0.0–0.1)
Basophils Relative: 0 %
Eosinophils Absolute: 0.1 10*3/uL (ref 0.0–0.5)
Eosinophils Relative: 1 %
HCT: 34.4 % — ABNORMAL LOW (ref 36.0–46.0)
Hemoglobin: 10.6 g/dL — ABNORMAL LOW (ref 12.0–15.0)
Immature Granulocytes: 0 %
Lymphocytes Relative: 25 %
Lymphs Abs: 2 10*3/uL (ref 0.7–4.0)
MCH: 29.8 pg (ref 26.0–34.0)
MCHC: 30.8 g/dL (ref 30.0–36.0)
MCV: 96.6 fL (ref 80.0–100.0)
Monocytes Absolute: 0.8 10*3/uL (ref 0.1–1.0)
Monocytes Relative: 11 %
Neutro Abs: 5 10*3/uL (ref 1.7–7.7)
Neutrophils Relative %: 63 %
Platelets: 301 10*3/uL (ref 150–400)
RBC: 3.56 MIL/uL — ABNORMAL LOW (ref 3.87–5.11)
RDW: 13.9 % (ref 11.5–15.5)
WBC: 7.8 10*3/uL (ref 4.0–10.5)
nRBC: 0 % (ref 0.0–0.2)

## 2019-11-24 LAB — LIPID PANEL
Cholesterol: 155 mg/dL (ref 0–200)
HDL: 83 mg/dL (ref >40)
LDL Cholesterol: 59 mg/dL (ref 0–99)
Total CHOL/HDL Ratio: 1.9 RATIO
Triglycerides: 67 mg/dL (ref <150)
VLDL: 13 mg/dL (ref 0–40)

## 2019-11-24 LAB — BASIC METABOLIC PANEL
Anion gap: 6 (ref 5–15)
BUN: 11 mg/dL (ref 8–23)
CO2: 26 mmol/L (ref 22–32)
Calcium: 8.9 mg/dL (ref 8.9–10.3)
Chloride: 102 mmol/L (ref 98–111)
Creatinine, Ser: 0.97 mg/dL (ref 0.44–1.00)
GFR calc Af Amer: >60 mL/min (ref >60)
GFR calc non Af Amer: 55 mL/min — ABNORMAL LOW (ref >60)
Glucose, Bld: 92 mg/dL (ref 70–99)
Potassium: 3.9 mmol/L (ref 3.5–5.1)
Sodium: 134 mmol/L — ABNORMAL LOW (ref 135–145)

## 2019-11-24 LAB — HEMOGLOBIN A1C
Hgb A1c MFr Bld: 5.6 % (ref 4.8–5.6)
Mean Plasma Glucose: 114.02 mg/dL

## 2019-11-24 LAB — URINE CULTURE: Culture: <10000 — AB

## 2019-11-24 MED ORDER — DIPHENHYDRAMINE HCL 25 MG PO CAPS
25.0000 mg | ORAL_CAPSULE | Freq: Once | ORAL | Status: AC
  Administered 2019-11-24: 25 mg via ORAL
  Filled 2019-11-24: qty 1

## 2019-11-24 MED ORDER — FLUTICASONE PROPIONATE 50 MCG/ACT NA SUSP
1.0000 | Freq: Two times a day (BID) | NASAL | Status: DC
  Administered 2019-11-24 – 2019-12-09 (×31): 1 via NASAL
  Filled 2019-11-24 (×3): qty 16

## 2019-11-24 MED ORDER — LACTULOSE 10 GM/15ML PO SOLN
10.0000 g | Freq: Every morning | ORAL | Status: DC
  Administered 2019-11-24 – 2019-12-10 (×17): 10 g via ORAL
  Filled 2019-11-24 (×17): qty 30

## 2019-11-24 NOTE — Progress Notes (Signed)
PROGRESS NOTE  Christina Fry  DOB: Oct 09, 1940  PCP: Ferdie Ping, MD PIR:[id number]  DOA: 11/22/2019  LOS: 1 day   Chief Complaint  Patient presents with  . Weakness   Brief narrative: Christina Fry is a 79 y.o. female with PMH of polymyalgia rheumatica, fibromyalgia, GERD, hypertension, hypothyroidism. Patient presented to the ED on 11/22/2019 with progressively worsening generalized weakness, shakiness and multiple tender points in the body for last few days culminating to a fall on 9/10. At baseline, patient ambulates with a walker but she has been avoiding ambulating last few days in fear of falling.  She also complains of worsening pain and swelling of both lower extremities and also complains of body pain and back pain even on coughing.  Since the night before, patient also has blurring of vision on the left eye. She has a history of polymyalgia rheumatica with an acute flare of 10 years ago, ended up in an LTAC for intensive rehab.  She improved with prednisone and had to take it for 3 to 4 years and gradually tapered off it. [facility]  In the ED, patient was hemodynamically stable. Labs unremarkable.   CT scan of head, cervical spine, thoracic spine, lumbar spine did not show any acute abnormality.  Chronic T5 compression fracture was noted.   Chest x-ray showed emphysematous and chronic bronchitis changes in the lungs with no evidence of active pulmonary disease.  Patient was admitted under hospitalist service for further evaluation management  Subjective: Patient was seen and examined this morning.   Not in distress.  Continues to have weakness, tremors and visual impairment.   Pending bed availability at [facility].  Assessment/Plan: Acute neurological symptoms Cerebellar stroke versus flareup of PMR -Presented with few days of progressively worsening generalized weakness, shakiness and blurring of vision and a fall -On examination, she continues to have  generalized weakness, limited mobility of joints because of pain.  Intentional tremors present on both hands.  -CT scann of head and spine is negative for acute findings.  -Patient needs neurology evaluation as well as an MRI brain to rule out cerebellar stroke. Not available at [facility] hospital on weekend. Case discussed with neurologist Dr. Rory Percy.  Pending bed availability at [facility]. -Stroke work-up ordered including echocardiogram, carotid duplex, PT/OT/ST eval, hemoglobin A1c, lipid panel, TSH.   -Aspirin 325 mg daily and statin ordered for now.   Polymyalgia rheumatica -She has a history of polymyalgia rheumatica with an acute flare of 10 years ago, ended up in an LTAC for intensive rehab.  She improved with prednisone and had to take it for 3 to 4 years and gradually tapered off it. -On exam, she has significant swelling and tenderness in both lower extremities.  She recalls similar symptoms with last flareup. -Currently she is on IV Solu-Medrol 40 mg daily. -No improvement in symptoms in last 24 hours.  Continue to monitor symptoms. -Continue Tylenol 650 mg p.o. every 6 hours as needed for pain -PT eval.  Fall precautions.  Bilateral lower extremity swelling and pain -Suspect secondary to PMR.  Ultrasound duplex of lower extremities ruled out DVT.  Fibromyalgia -Continue gabapentin and nortriptyline per home regimen  Dehydration -Gentle IV hydration given in the ED.  Encourage oral intake  Essential hypertension -Continue Lopressor per home regimen  Hypothyroidism -Continue Synthroid per home regimen  GERD -Continue Protonix  Mobility: PT eval obtained Code Status:   Code Status: Full Code  Nutritional status: Body mass index is 28.52  kg/m.     Diet Order            Diet Heart Room service appropriate? Yes; Fluid consistency: Thin  Diet effective now                DVT prophylaxis: enoxaparin (LOVENOX) injection 40 mg Start: 11/23/19 1000 SCDs Start: 11/23/19  0323   Antimicrobials:  None Fluid: None Consultants: Neurology on the phone. Family Communication:  None at bedside today  Status is: Inpatient Remains inpatient appropriate because:Ongoing diagnostic testing needed not appropriate for outpatient work up and IV treatments appropriate due to intensity of illness or inability to take PO  Dispo: The patient is from: Home              Anticipated d/c is to: Pending transfer [facility]              Anticipated d/c date is: Whenever bed is available            Infusions:    Scheduled Meds: .  stroke: mapping our early stages of recovery book   Does not apply Once  . aspirin  300 mg Rectal Daily   Or  . aspirin  325 mg Oral Daily  . atorvastatin  20 mg Oral QHS  . calcium-vitamin D  1 tablet Oral Q breakfast  . cholecalciferol  400 Units Oral Daily  . docusate sodium  100 mg Oral BID  . enoxaparin (LOVENOX) injection  40 mg Subcutaneous Q24H  . fluticasone  1 spray Each Nare BID  . gabapentin  200 mg Oral BID  . lactulose  10 g Oral q AM  . levothyroxine  88 mcg Oral QAC breakfast  . methylPREDNISolone (SOLU-MEDROL) injection  40 mg Intravenous Q24H  . metoprolol tartrate  25 mg Oral BID  . nortriptyline  25 mg Oral QHS  . pantoprazole (PROTONIX) IV  40 mg Intravenous Q24H    Antimicrobials: Anti-infectives (From admission, onward)   None      PRN meds: acetaminophen, ondansetron, traMADol   Objective: Vitals:   11/24/19 0547 11/24/19 0830  BP: (!) 148/74 (!) 148/72  Pulse: 68 76  Resp: 18 20  Temp: 99.1 F (37.3 C) 98.9 F (37.2 C)  SpO2: 95% 97%    Intake/Output Summary (Last 24 hours) at 11/24/2019 1333 Last data filed at 11/24/2019 1151 Gross per 24 hour  Intake 120 ml  Output 400 ml  Net -280 ml   Filed Weights   11/22/19 1842  Weight: 73 kg   Weight change:  Body mass index is 28.52 kg/m.   Physical Exam: General exam: Appears calm and comfortable.  Not in physical distress  Skin: No rashes,  lesions or ulcers. HEENT: Atraumatic, normocephalic, supple neck, no obvious bleeding Lungs: Clear to auscultation bilaterally CVS: Regular rate and rhythm, no murmur GI/Abd soft, nontender, nondistended, bowel sound present CNS: Alert, awake, oriented x3, generalized weakness and shakiness present.  Limited mobility of joints because of pain.  Psychiatry: Mood appropriate Extremities: Continues have significant tenderness on both lower extremities with mild swelling. Data Review: I have personally reviewed the laboratory data and studies available.  Recent Labs  Lab 11/22/19 2020 11/23/19 0514 11/24/19 0813  WBC 10.4 9.1 7.8  NEUTROABS  --   --  5.0  HGB 13.0 11.9* 10.6*  HCT 41.1 38.3 34.4*  MCV 97.4 96.7 96.6  PLT 348 315 301   Recent Labs  Lab 11/22/19 2020 11/23/19 0514 11/24/19 0813  NA 137  141 134*  K 3.8 3.5 3.9  CL 101 105 102  CO2 26 26 26   GLUCOSE 109* 100* 92  BUN 14 12 11   CREATININE 1.09* 0.97 0.97  CALCIUM 9.6 9.1 8.9  MG  --  1.8  --   PHOS  --  3.1  --     F/u labs ordered  Signed, Terrilee Croak, MD [facility] 11/24/2019

## 2019-11-25 ENCOUNTER — Inpatient Hospital Stay (HOSPITAL_COMMUNITY): Payer: No Typology Code available for payment source

## 2019-11-25 DIAGNOSIS — J301 Allergic rhinitis due to pollen: Secondary | ICD-10-CM

## 2019-11-25 LAB — TSH: TSH: 1.176 u[IU]/mL (ref 0.350–4.500)

## 2019-11-25 LAB — VITAMIN B12: Vitamin B-12: 269 pg/mL (ref 180–914)

## 2019-11-25 IMAGING — MR MR HEAD W/O CM
11 of 12 series · 41 of 48 positions shown · non-contrast
Comparison: [DATE] head CT and prior.

CLINICAL DATA: Transit ischemic attack.

EXAM:
MRI HEAD WITHOUT CONTRAST
TECHNIQUE: Multiplanar, multiecho pulse sequences of the brain and surrounding
structures were obtained without intravenous contrast.

[Series 5: DWI · axial · 4.0mm · 0.88mm/px · z∈[-29,+108]mm · 5 of 36 slices shown (1 of 6)]
[im 1/36]
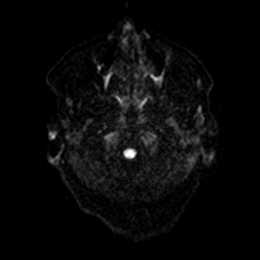
[im 9/36]
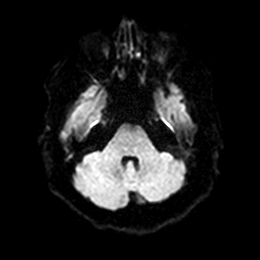
[im 18/36]
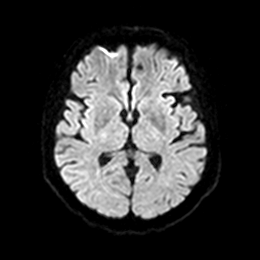
[im 27/36]
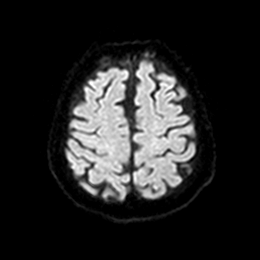
[im 36/36]
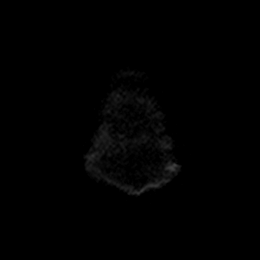

[Series 5: DWI · axial · 4.0mm · 0.88mm/px · z∈[-29,+108]mm · 5 of 36 slices shown (2 of 6)]
[im 1/36]
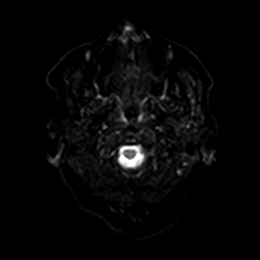
[im 9/36]
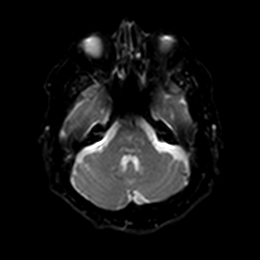
[im 18/36]
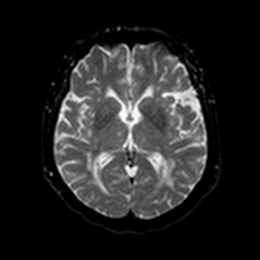
[im 27/36]
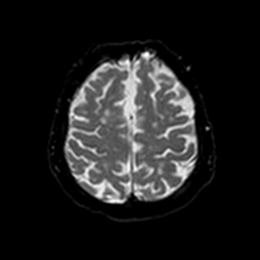
[im 36/36]
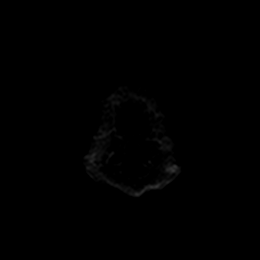

[Series 6: DWI · axial · 4.0mm · 0.88mm/px · z∈[-29,+108]mm · 4 of 36 slices shown (3 of 6)]
[im 1/36]
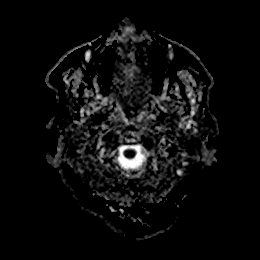
[im 12/36]
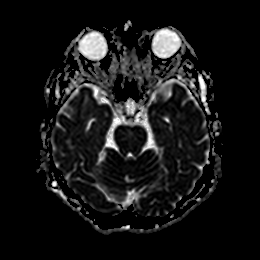
[im 24/36]
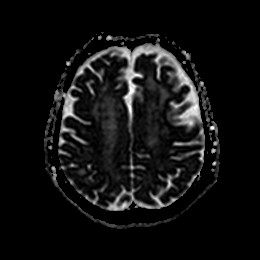
[im 36/36]
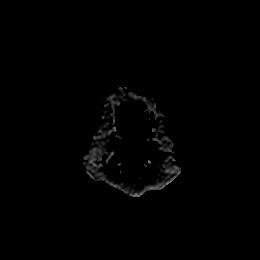

[Series 7: DWI · coronal · 4.0mm · 0.88mm/px · 4 of 32 slices shown (4 of 6)]
[im 1/32]
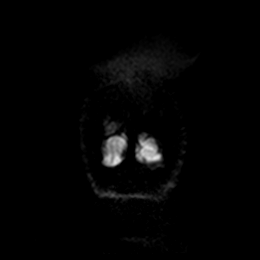
[im 11/32]
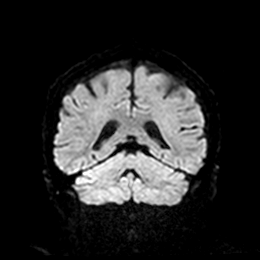
[im 21/32]
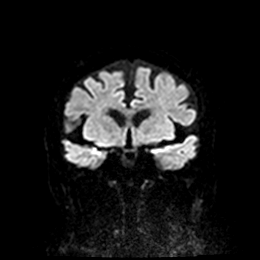
[im 32/32]
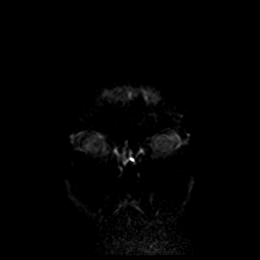

[Series 7: DWI · coronal · 4.0mm · 0.88mm/px · 4 of 32 slices shown (5 of 6)]
[im 1/32]
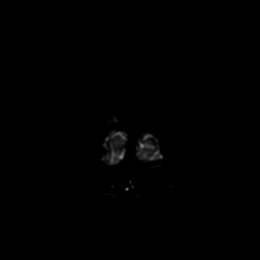
[im 11/32]
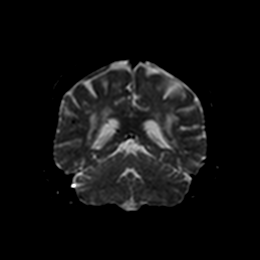
[im 21/32]
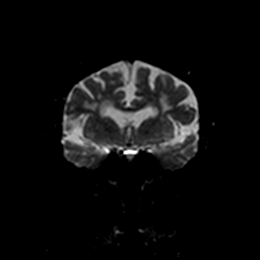
[im 32/32]
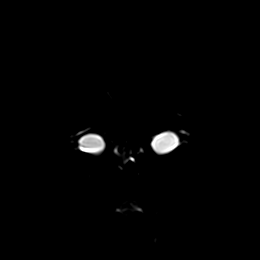

[Series 8: DWI · coronal · 4.0mm · 0.88mm/px · 4 of 32 slices shown (6 of 6)]
[im 1/32]
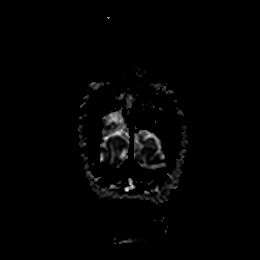
[im 11/32]
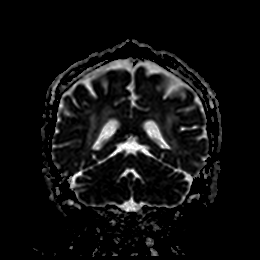
[im 21/32]
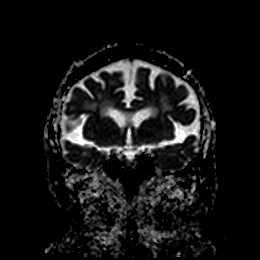
[im 32/32]
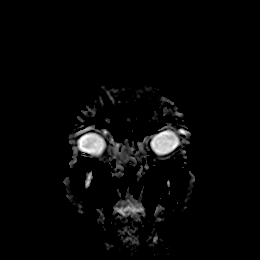

[Series 9: T1 · sagittal · 5.0mm · 0.94mm/px · 3 of 25 slices shown]
[im 1/25]
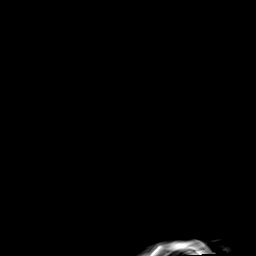
[im 13/25]
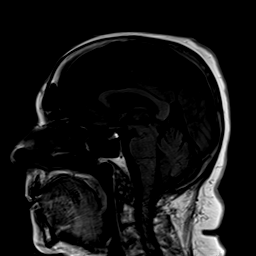
[im 25/25]
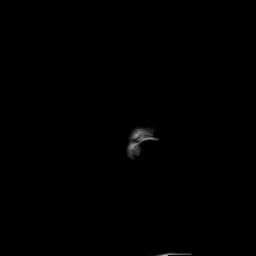

[Series 10: T2 · axial · 5.0mm · 0.72mm/px · z∈[-25,+104]mm · 2 of 20 slices shown (1 of 2)]
[im 1/20]
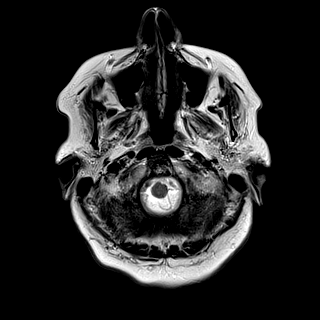
[im 20/20]
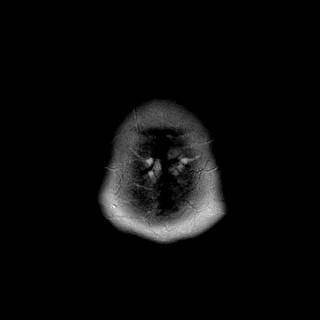

[Series 11: ax hemo · axial · 5.0mm · 0.86mm/px · z∈[-33,+108]mm · 3 of 25 slices shown]
[im 1/25]
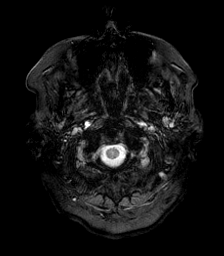
[im 13/25]
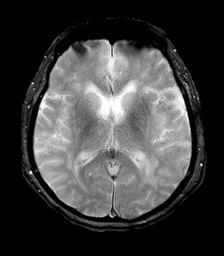
[im 25/25]
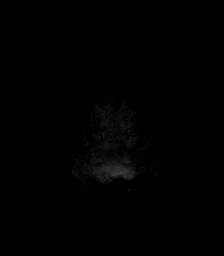

[Series 12: FLAIR · axial · 4.0mm · 0.43mm/px · z∈[-23,+98]mm · 4 of 32 slices shown]
[im 1/32]
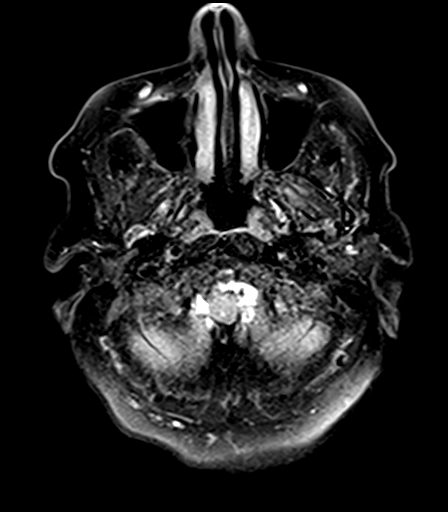
[im 11/32]
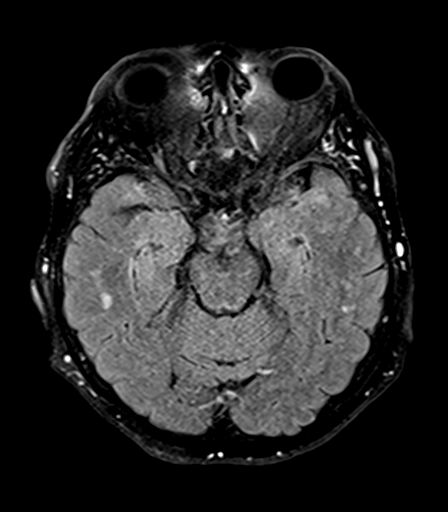
[im 21/32]
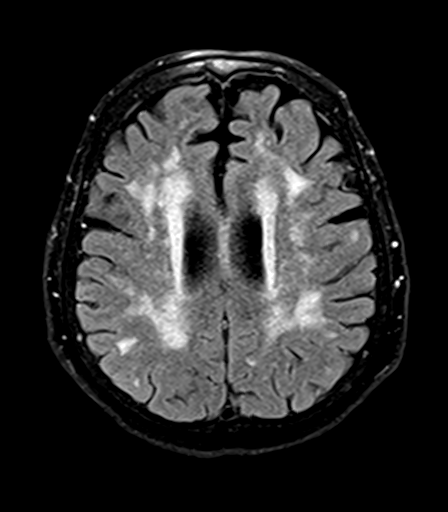
[im 32/32]
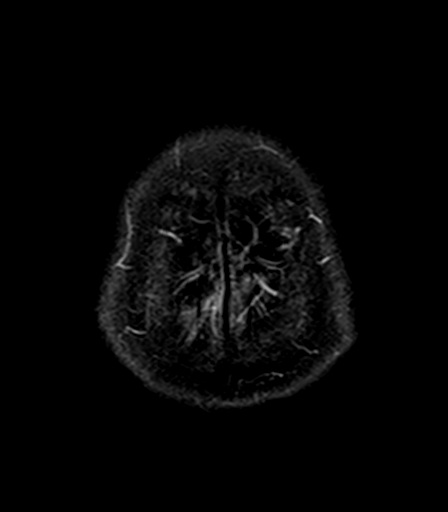

[Series 14: T2 · coronal · 5.0mm · 0.72mm/px · 3 of 28 slices shown (2 of 2)]
[im 1/28]
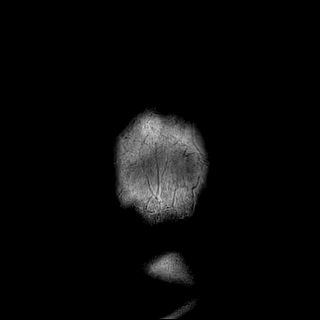
[im 14/28]
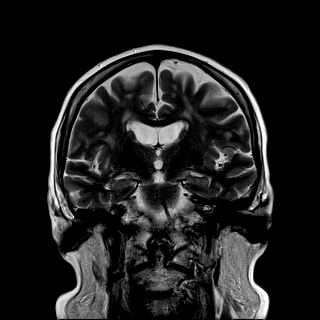
[im 28/28]
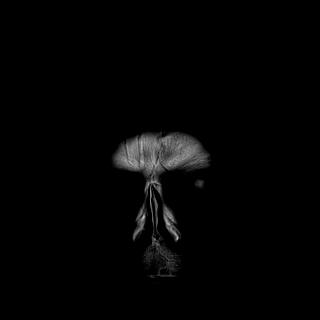

[41 of 48 positions shown; findings below may reference images not displayed]

FINDINGS: Brain: Moderate cerebral atrophy with ex vacuo dilatation. No
diffusion-weighted signal abnormality. No intracranial hemorrhage.
Scattered and confluent T2 hyperintensities involving the
periventricular/subcortical white matter and brainstem are
nonspecific however commonly associated with chronic microvascular
ischemic changes.

No midline shift, ventriculomegaly or extra-axial fluid collection.
No mass lesion.

Vascular: Normal flow voids.

Skull and upper cervical spine: Normal marrow signal.

Sinuses/Orbits: Sequela of bilateral lens replacement. Mild ethmoid
sinus mucosal thickening. No mastoid effusion.

Other: None.
IMPRESSION: No acute intracranial process.

Moderate cerebral atrophy. Advanced chronic microvascular ischemic
changes.

## 2019-11-25 MED ORDER — SODIUM CHLORIDE 0.9 % IV SOLN
500.0000 mg | INTRAVENOUS | Status: DC
  Administered 2019-11-26 – 2019-11-30 (×5): 500 mg via INTRAVENOUS
  Filled 2019-11-25 (×8): qty 4

## 2019-11-25 MED ORDER — LORATADINE 10 MG PO TABS
10.0000 mg | ORAL_TABLET | Freq: Every day | ORAL | Status: DC
  Administered 2019-11-25 – 2019-12-10 (×16): 10 mg via ORAL
  Filled 2019-11-25 (×16): qty 1

## 2019-11-25 MED ORDER — GABAPENTIN 300 MG PO CAPS
300.0000 mg | ORAL_CAPSULE | Freq: Three times a day (TID) | ORAL | Status: DC
  Administered 2019-11-25 – 2019-12-10 (×44): 300 mg via ORAL
  Filled 2019-11-25 (×44): qty 1

## 2019-11-25 NOTE — Progress Notes (Signed)
VIALS EXP 11-24-20

## 2019-11-25 NOTE — Progress Notes (Signed)
PROGRESS NOTE  Christina Fry  DOB: 1940/04/03  PCP: Ferdie Ping, MD FEO:[id number]  DOA: 11/22/2019  LOS: 2 days   Chief Complaint  Patient presents with  . Weakness   Brief narrative: Christina Fry is a 79 y.o. female with PMH of polymyalgia rheumatica, fibromyalgia, GERD, hypertension, hypothyroidism. Patient presented to the ED on 11/22/2019 with progressively worsening generalized weakness, shakiness and multiple tender points in the body for last few days culminating to a fall on 9/10. At baseline, patient ambulates with a walker but she has been avoiding ambulating last few days in fear of falling.  She also complains of worsening pain and swelling of both lower extremities and also complains of body pain and back pain even on coughing.  Since the night before, patient also has blurring of vision on the left eye. She has a history of polymyalgia rheumatica with an acute flare of 10 years ago, ended up in an LTAC for intensive rehab.  She improved with prednisone and had to take it for 3 to 4 years and gradually tapered off it. [facility]  In the ED, patient was hemodynamically stable. Labs unremarkable.   CT scan of head, cervical spine, thoracic spine, lumbar spine did not show any acute abnormality.  Chronic T5 compression fracture was noted.   Chest x-ray showed emphysematous and chronic bronchitis changes in the lungs with no evidence of active pulmonary disease.  Patient was admitted under hospitalist service for further evaluation management  Subjective: Patient was seen and examined this morning.    Not in distress.  Continues to have weakness, tremors and visual impairment.   Pending bed availability at [facility].  Assessment/Plan: Acute neurological symptoms -Admitted with possible diagnosis of cerebellar stroke versus flareup of PMR -Presented with few days of progressively worsening generalized weakness, shakiness and blurring of vision and a fall -Stroke  work-up ordered.  MRI brain did not show any acute intracranial abnormality. -Neurology consultation pending today. -Currently patient is on aspirin 325 mg daily and statin. -Patient reports improvement in tremors today.  But she remains weak.  Polymyalgia rheumatica -She has a history of polymyalgia rheumatica with an acute flare of 10 years ago, ended up in an LTAC for intensive rehab.  She improved with prednisone and had to take it for 3 to 4 years and gradually tapered off it. -Because of PMR, patient states she always has pain in both lower extremities but generalized weakness and tremors that she has now are similar to previous exacerbation of PMR several years ago.  -Currently she is on IV Solu-Medrol 40 mg daily. -No improvement in symptoms in last 48 hours.  Continue to monitor symptoms. -Continue Tylenol 650 mg p.o. every 6 hours as needed for pain -I also increased the dose of gabapentin to 300 mg 3 times daily  Bilateral lower extremity swelling and pain -Suspect secondary to PMR.  Ultrasound duplex of lower extremities ruled out DVT.  Fibromyalgia -Continue gabapentin and nortriptyline per home regimen  Dehydration -Gentle IV hydration given in the ED.  Encourage oral intake  Essential hypertension -Continue Lopressor per home regimen  Hypothyroidism -Continue Synthroid per home regimen  GERD -Continue Protonix  Mobility: PT eval obtained Code Status:   Code Status: Full Code  Nutritional status: Body mass index is 28.52 kg/m.     Diet Order            Diet Heart Room service appropriate? Yes; Fluid consistency: Thin  Diet effective  now                DVT prophylaxis: enoxaparin (LOVENOX) injection 40 mg Start: 11/23/19 1000 SCDs Start: 11/23/19 0323   Antimicrobials:  None Fluid: None Consultants: Neurology on the phone. Family Communication:  None at bedside today  Status is: Inpatient  Remains inpatient appropriate because:Ongoing active  pain requiring inpatient pain management and Ongoing diagnostic testing needed not appropriate for outpatient work up   Dispo: The patient is from: Home              Anticipated d/c is to: SNF              Anticipated d/c date is: 3 days              Patient currently is not medically stable to d/c.               Infusions:  . [START ON 11/26/2019] methylPREDNISolone (SOLU-MEDROL) injection      Scheduled Meds: .  stroke: mapping our early stages of recovery book   Does not apply Once  . aspirin  300 mg Rectal Daily   Or  . aspirin  325 mg Oral Daily  . atorvastatin  20 mg Oral QHS  . calcium-vitamin D  1 tablet Oral Q breakfast  . cholecalciferol  400 Units Oral Daily  . docusate sodium  100 mg Oral BID  . enoxaparin (LOVENOX) injection  40 mg Subcutaneous Q24H  . fluticasone  1 spray Each Nare BID  . gabapentin  300 mg Oral TID  . lactulose  10 g Oral q AM  . levothyroxine  88 mcg Oral QAC breakfast  . loratadine  10 mg Oral Daily  . metoprolol tartrate  25 mg Oral BID  . nortriptyline  25 mg Oral QHS  . pantoprazole (PROTONIX) IV  40 mg Intravenous Q24H    Antimicrobials: Anti-infectives (From admission, onward)   None      PRN meds: acetaminophen, ondansetron, traMADol   Objective: Vitals:   11/25/19 0754 11/25/19 1300  BP: (!) 152/62 132/69  Pulse: 65 69  Resp: 18 18  Temp: 98.3 F (36.8 C) 98.3 F (36.8 C)  SpO2: 93% 94%    Intake/Output Summary (Last 24 hours) at 11/25/2019 1539 Last data filed at 11/25/2019 1300 Gross per 24 hour  Intake 480 ml  Output 740 ml  Net -260 ml   Filed Weights   11/22/19 1842  Weight: 73 kg   Weight change:  Body mass index is 28.52 kg/m.   Physical Exam: General exam: Appears calm and comfortable.  Not in physical distress  Skin: No rashes, lesions or ulcers. HEENT: Atraumatic, normocephalic, supple neck, no obvious bleeding Lungs: Clear to auscultation bilaterally CVS: Regular rate and rhythm, no  murmur GI/Abd soft, nontender, nondistended, bowel sound present CNS: Alert, awake, oriented x3, generalized weakness.  Tremors improved.  Psychiatry: Mood appropriate Extremities: Continues have significant tenderness on both lower extremities. Data Review: I have personally reviewed the laboratory data and studies available.  Recent Labs  Lab 11/22/19 2020 11/23/19 0514 11/24/19 0813  WBC 10.4 9.1 7.8  NEUTROABS  --   --  5.0  HGB 13.0 11.9* 10.6*  HCT 41.1 38.3 34.4*  MCV 97.4 96.7 96.6  PLT 348 315 301   Recent Labs  Lab 11/22/19 2020 11/23/19 0514 11/24/19 0813  NA 137 141 134*  K 3.8 3.5 3.9  CL 101 105 102  CO2 26 26 26   GLUCOSE  109* 100* 92  BUN 14 12 11   CREATININE 1.09* 0.97 0.97  CALCIUM 9.6 9.1 8.9  MG  --  1.8  --   PHOS  --  3.1  --     F/u labs ordered  Signed, Terrilee Croak, MD [facility] 11/25/2019

## 2019-11-25 NOTE — [place] FL2 (Signed)
[place] MEDICAID FL2 LEVEL OF CARE SCREENING TOOL     IDENTIFICATION  Patient Name: Christina Fry Birthdate: 1940-07-29 Sex: female Admission Date (Current Location): 11/22/2019  [facility] and [place] Number:  [employer] and Address:  [place] [address] [place]      Provider Number: [id number]  Attending Physician Name and Address:  Terrilee Croak, MD  Relative Name and Phone Number:  [name]  [phone]    Current Level of Care: Hospital Recommended Level of Care: [place] Prior Approval Number:    Date Approved/Denied:   PASRR Number: [id number]  Discharge Plan: SNF    Current Diagnoses: Patient Active Problem List   Diagnosis Date Noted   Generalized weakness 11/23/2019   PMR (polymyalgia rheumatica) ([place]) 11/23/2019   GERD (gastroesophageal reflux disease) 11/23/2019   Fibromyalgia 11/23/2019   Essential hypertension 11/23/2019   Hypothyroidism 11/23/2019   Ambulatory dysfunction 11/23/2019   Accident due to mechanical fall without injury 11/23/2019   TIA (transient ischemic attack) 11/23/2019   Seasonal and perennial allergic rhinitis 04/04/2018   Mild intermittent asthma without complication 72/11/4707    Orientation RESPIRATION BLADDER Height & Weight     Self, Time, Situation  Normal Incontinent, External catheter Weight: 161 lb (73 kg) Height:  5\' 3"  (160 cm)  BEHAVIORAL SYMPTOMS/MOOD NEUROLOGICAL BOWEL NUTRITION STATUS      Continent Diet (Heart healthy, see discharge summary)  AMBULATORY STATUS COMMUNICATION OF NEEDS Skin   Extensive Assist Verbally Normal                       Personal Care Assistance Level of Assistance  Bathing, Feeding, Dressing Bathing Assistance: Maximum assistance Feeding assistance: Independent Dressing Assistance: Maximum assistance     Functional Limitations Info  Sight, Speech, Hearing Sight Info: Impaired Hearing Info:  Adequate Speech Info: Adequate    SPECIAL CARE FACTORS FREQUENCY  PT (By licensed PT), OT (By licensed OT)     PT Frequency: 5 times weekly OT Frequency: 5 times weekly            Contractures Contractures Info: Not present    Additional Factors Info  Code Status, Allergies Code Status Info: Full Allergies Info: Iohexol, codeine, morphine and related, nitrofuran derivatives, oxycodone-acetaminophen           Current Medications (11/25/2019):  This is the current hospital active medication list Current Facility-Administered Medications  Medication Dose Route Frequency Provider Last Rate Last Admin    stroke: mapping our early stages of recovery book   Does not apply Once Dahal, Marlowe Aschoff, MD       acetaminophen (TYLENOL) tablet 650 mg  650 mg Oral Q6H PRN Adefeso, Oladapo, DO   650 mg at 11/23/19 6283   aspirin suppository 300 mg  300 mg Rectal Daily Dahal, Binaya, MD       Or   aspirin tablet 325 mg  325 mg Oral Daily Dahal, Binaya, MD   325 mg at 11/25/19 0801   atorvastatin (LIPITOR) tablet 20 mg  20 mg Oral QHS Dahal, Marlowe Aschoff, MD   20 mg at 11/24/19 2124   calcium-vitamin D (OSCAL WITH D) 500-200 MG-UNIT per tablet 1 tablet  1 tablet Oral Q breakfast Adefeso, Oladapo, DO   1 tablet at 11/25/19 0801   cholecalciferol (VITAMIN D3) tablet 400 Units  400 Units Oral Daily Adefeso, Oladapo, DO   400 Units at 11/25/19 0804   docusate sodium (COLACE) capsule 100  mg  100 mg Oral BID Adefeso, Oladapo, DO   100 mg at 11/25/19 0801   enoxaparin (LOVENOX) injection 40 mg  40 mg Subcutaneous Q24H Adefeso, Oladapo, DO   40 mg at 11/25/19 0807   fluticasone (FLONASE) 50 MCG/ACT nasal spray 1 spray  1 spray Each Nare BID Terrilee Croak, MD   1 spray at 11/25/19 0807   gabapentin (NEURONTIN) capsule 200 mg  200 mg Oral BID Dahal, Marlowe Aschoff, MD   200 mg at 11/25/19 0835   lactulose (CHRONULAC) 10 GM/15ML solution 10 g  10 g Oral q AM Dahal, Marlowe Aschoff, MD   10 g at 11/25/19 0609    levothyroxine (SYNTHROID) tablet 88 mcg  88 mcg Oral QAC breakfast Adefeso, Oladapo, DO   88 mcg at 11/25/19 5809   loratadine (CLARITIN) tablet 10 mg  10 mg Oral Daily Dahal, Marlowe Aschoff, MD   10 mg at 11/25/19 0926   [START ON 11/26/2019] methylPREDNISolone sodium succinate (SOLU-MEDROL) 500 mg in sodium chloride 0.9 % 50 mL IVPB  500 mg Intravenous Q24H Phillips Odor, MD       metoprolol tartrate (LOPRESSOR) tablet 25 mg  25 mg Oral BID Adefeso, Oladapo, DO   25 mg at 11/25/19 0802   nortriptyline (PAMELOR) capsule 25 mg  25 mg Oral QHS Dahal, Marlowe Aschoff, MD   25 mg at 11/24/19 2123   ondansetron (ZOFRAN) tablet 4 mg  4 mg Oral Q8H PRN Dahal, Marlowe Aschoff, MD   4 mg at 11/23/19 1605   pantoprazole (PROTONIX) injection 40 mg  40 mg Intravenous Q24H Dahal, Marlowe Aschoff, MD   40 mg at 11/25/19 0806   traMADol (ULTRAM) tablet 100 mg  100 mg Oral Daily PRN Terrilee Croak, MD   100 mg at 11/24/19 2125     Discharge Medications: Please see discharge summary for a list of discharge medications.  Relevant Imaging Results:  Relevant Lab Results:   Additional Information SSN#: [id number], LCSWA

## 2019-11-25 NOTE — Consult Note (Signed)
Collins A. Merlene Laughter, MD     [email or web address]          Christina Fry is an 79 y.o. female.   ASSESSMENT/PLAN: 1. ACUTE GAIT IMPAIRMENT, LEG WEAKNESS, TREMORS ON FALLING:  The workup has been mostly unrevealing for structural lesion to explain her symptoms.   She has preserved reflexes which argues against Guillain-Barre syndrome or other forms of severe neuropathy. It appears that her symptoms started chronologically after she was given medication for abdominal symptoms raising the possibility that at least part of her symptoms could be due to medication effect. Given her past history of polymyalgia rheumatica causing similar presentation, I suspect this may also be causative. The tremors are somewhat different than her previous presentation indicate that she may have a baseline movement disorder.  High-dose steroids are recommended. 2.  SEVERE LEG PAIN:  Again, this is most likely due to polymyalgia rheumatica. High-dose steroids are recommended for about 3 days.  3. Subacute tremors worsened acutely after being treated for abdominal symptoms. This will be observed for now. I suspect that she may have had medications that cause the tremors to deteriorate but she has improved spontaneously with discontinuation of these medications. Her baseline medications does not suggest typical culprits that could cause tremors. She is on low-dose antidepressants which I suspect are not causative at this time. 4. 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    This is an 79 year old white female who was treated at a hospital for abdominal complaints about 3-4 weeks ago. She tells me that she went to the [place] in [place] for this.  She was given medications in the hospital emergency room and also sent home on medications. She reports that her abdominal symptoms resolve but a few days after she has started having what appears to be worsening tremors and global weakness. She tells me  at about over the last 3 months or so she has had tremors of her hands bilaterally. However, the tremors got dramatically worse over the last 2 days. She reports global weakness and difficulties ambulating to the point that she fell and could not get up resulting in the patient being taken to the hospital further evaluation. She apparently had an episode of polymyalgia rheumatica about 10 years ago and had similar symptoms of global weakness and difficulty ambulating along with the leg pain that was severe. She reports having severe leg pain during this hospitalization. She reports responded well to appropriate treatment at that time but tells me  Tremors she has been having is unusual and she did not present with tremors during her last hospitalization about 10 years ago. She reports having headaches that is chronic for which she takes gabapentin. Headaches are in the right frontal region. She reports she has been on this for years. She was started on new medication after she was seen in the hospital about 3 weeks ago but she does not recall which medications. She tells me that she discontinued the medication however. Since he is being hospitalized, her tremors have improved but she still has difficulties ambulating, leg weakness and severe leg pain. The review of systems otherwise negative.      GENERAL:  This is the pleasant female who is doing well at this time.  HEENT:  Neck is supple no trauma noted.  ABDOMEN: Soft  EXTREMITIES: No edema; says severe arthritic changes of the knees bilaterally. There is severe pain with even subtle palpation and manipulation of the legs bilaterally  especially the knees and ankles. No swelling is noted however. No discoloration.   BACK: Normal alignment.  SKIN: Normal by inspection.    MENTAL STATUS: Alert and oriented. Speech, language and cognition are generally intact. Judgment and insight normal.   CRANIAL NERVES: Pupils are equal, round and reactive to  light and accommodation; extraocular movements are full, there is no significant nystagmus; upper and lower facial muscles are normal in strength and symmetric, there is no flattening of the nasolabial folds; tongue is midline; uvula is midline; shoulder elevation is normal.  MOTOR: Normal tone, bulk and strength in the arms; no pronator drift.  Says severe leg weakness graded as 2/5 associated with severe pain. Bulk and tone are normal throughout.  COORDINATION: Left finger to nose is normal, right finger to nose is normal, No rest tremor; no intention tremor; no postural tremor; no bradykinesia.  REFLEXES: Deep tendon reflexes are symmetrical and mildly reduced to normal throughout. and normal.   SENSATION: Normal to light touch and temperature.    Blood pressure (!) 152/62, pulse 65, temperature 98.3 F (36.8 C), temperature source Oral, resp. rate 18, height 5\' 3"  (1.6 m), weight 73 kg, SpO2 93 %.  Past Medical History:  Diagnosis Date  . Anxiety   . Cancer ([place])   . Diabetes mellitus without complication ([place])   . Fibromyalgia   . GERD (gastroesophageal reflux disease)   . Grave's disease   . Herpes   . Hyperlipidemia   . Mild intermittent asthma without complication 1/96/2229  . Palpitation   . Polymyalgia rheumatica (H[facility])     Past Surgical History:  Procedure Laterality Date  . ABDOMINAL HYSTERECTOMY    . APPENDECTOMY    . CARPAL TUNNEL RELEASE    . HERNIA REPAIR    . INCISIONAL HERNIA REPAIR  03/20/2012   Procedure: LAPAROSCOPIC INCISIONAL HERNIA;  Surgeon: Jamesetta So, MD;  Location: [facility];  Service: General;  Laterality: N/A;  . INSERTION OF MESH  03/20/2012   Procedure: INSERTION OF MESH;  Surgeon: Jamesetta So, MD;  Location: [facility];  Service: General;  Laterality: N/A;  . TOTAL ABDOMINAL HYSTERECTOMY W/ BILATERAL SALPINGOOPHORECTOMY      Family History  Problem Relation Age of Onset  . Allergic rhinitis Neg Hx   . Asthma Neg Hx   . Eczema Neg Hx   .  Urticaria Neg Hx   . Angioedema Neg Hx   . Atopy Neg Hx   . Immunodeficiency Neg Hx     Social History:  reports that she has never smoked. She has never used smokeless tobacco. She reports that she does not drink alcohol and does not use drugs.  Allergies:  Allergies  Allergen Reactions  . Iohexol Shortness Of Breath    Pt stopped breathing at [facility]. Pt refuses IV dye  . Codeine Itching  . Morphine And Related Itching  . Nitrofuran Derivatives Other (See Comments)    Unknown  . Oxycodone-Acetaminophen Itching    Medications: Prior to Admission medications   Medication Sig Start Date End Date Taking? Authorizing Provider  acetaminophen (TYLENOL) 325 MG tablet Take 650 mg by mouth in the morning and at bedtime.    Yes [provider]  albuterol (VENTOLIN HFA) 108 (90 Base) MCG/ACT inhaler Inhale 3 puffs into the lungs every 6 (six) hours as needed for wheezing or shortness of breath.   Yes [provider]  atorvastatin (LIPITOR) 20 MG tablet Take 20 mg by mouth at bedtime.  Yes [provider]  carboxymethylcellulose 1 % ophthalmic solution Apply 1-4 drops to eye every 2 (two) hours while awake. 3-4 drops in right eye, 1-2 drops in left eye every 2 hours until bedtime.   Yes [provider]  Docusate Sodium 100 MG capsule Take 100 mg by mouth 2 (two) times daily.   Yes [provider]  EPINEPHrine 0.3 mg/0.3 mL IJ SOAJ injection Inject 0.3 mg into the muscle as needed for anaphylaxis.   Yes [provider]  fluticasone (FLONASE) 50 MCG/ACT nasal spray Place 1 spray into both nostrils 2 (two) times daily. 04/05/18  Yes Valentina Shaggy, MD  gabapentin (NEURONTIN) 100 MG capsule Take 200 mg by mouth at bedtime.     Yes [provider]  lactulose (CHRONULAC) 10 GM/15ML solution Take 10 g by mouth in the morning.   Yes [provider]  levothyroxine (SYNTHROID, LEVOTHROID) 88 MCG tablet Take 88 mcg by mouth  daily before breakfast. 30 minutes prior to meal or patient will not eat meal   Yes [provider]  loratadine (CLARITIN) 10 MG tablet Take 10 mg by mouth daily.   Yes [provider]  metoprolol tartrate (LOPRESSOR) 25 MG tablet Take 12.5 mg by mouth 2 (two) times daily.    Yes [provider]  nortriptyline (PAMELOR) 25 MG capsule Take 25 mg by mouth at bedtime.     Yes [provider]  omeprazole (PRILOSEC) 20 MG capsule Take 20 mg by mouth 2 (two) times daily.    Yes [provider]  traMADol (ULTRAM) 50 MG tablet Take 100 mg by mouth daily as needed for severe pain.   Yes [provider]    Scheduled Meds: .  stroke: mapping our early stages of recovery book   Does not apply Once  . aspirin  300 mg Rectal Daily   Or  . aspirin  325 mg Oral Daily  . atorvastatin  20 mg Oral QHS  . calcium-vitamin D  1 tablet Oral Q breakfast  . cholecalciferol  400 Units Oral Daily  . docusate sodium  100 mg Oral BID  . enoxaparin (LOVENOX) injection  40 mg Subcutaneous Q24H  . fluticasone  1 spray Each Nare BID  . gabapentin  200 mg Oral BID  . lactulose  10 g Oral q AM  . levothyroxine  88 mcg Oral QAC breakfast  . loratadine  10 mg Oral Daily  . methylPREDNISolone (SOLU-MEDROL) injection  40 mg Intravenous Q24H  . metoprolol tartrate  25 mg Oral BID  . nortriptyline  25 mg Oral QHS  . pantoprazole (PROTONIX) IV  40 mg Intravenous Q24H   Continuous Infusions: PRN Meds:.acetaminophen, ondansetron, traMADol     Results for orders placed or performed during the hospital encounter of 11/22/19 (from the past 48 hour(s))  Hemoglobin A1c     Status: None   Collection Time: 11/24/19  8:13 AM  Result Value Ref Range   Hgb A1c MFr Bld 5.6 4.8 - 5.6 %    Comment: (NOTE) Pre diabetes:          5.7%-6.4%  Diabetes:              >6.4%  Glycemic control for   <7.0% adults with diabetes    Mean Plasma Glucose 114.02 mg/dL    Comment:  Performed at [facility] [address] [place], [place] [address]  Lipid panel     Status: None   Collection  Time: 11/24/19  8:13 AM  Result Value Ref Range   Cholesterol 155 0 - 200 mg/dL   Triglycerides 67 <150 mg/dL   HDL 83 >40 mg/dL   Total CHOL/HDL Ratio 1.9 RATIO   VLDL 13 0 - 40 mg/dL   LDL Cholesterol 59 0 - 99 mg/dL    Comment:        Total Cholesterol/HDL:CHD Risk Coronary Heart Disease Risk Table                     Men   Women  1/2 Average Risk   3.4   3.3  Average Risk       5.0   4.4  2 X Average Risk   9.6   7.1  3 X Average Risk  23.4   11.0        Use the calculated Patient Ratio above and the CHD Risk Table to determine the patient's CHD Risk.        ATP III CLASSIFICATION (LDL):  <100     mg/dL   Optimal  100-129  mg/dL   Near or Above                    Optimal  130-159  mg/dL   Borderline  160-189  mg/dL   High  >190     mg/dL   Very High Performed at [facility] [place], [place] [address]   Basic metabolic panel     Status: Abnormal   Collection Time: 11/24/19  8:13 AM  Result Value Ref Range   Sodium 134 (L) 135 - 145 mmol/L   Potassium 3.9 3.5 - 5.1 mmol/L   Chloride 102 98 - 111 mmol/L   CO2 26 22 - 32 mmol/L   Glucose, Bld 92 70 - 99 mg/dL    Comment: Glucose reference range applies only to samples taken after fasting for at least 8 hours.   BUN 11 8 - 23 mg/dL   Creatinine, Ser 0.97 0.44 - 1.00 mg/dL   Calcium 8.9 8.9 - 10.3 mg/dL   GFR calc non Af Amer 55 (L) >60 mL/min   GFR calc Af Amer >60 >60 mL/min   Anion gap 6 5 - 15    Comment: Performed at [facility], [address] [place], [place] [address]  CBC with Differential/Platelet     Status: Abnormal   Collection Time: 11/24/19  8:13 AM  Result Value Ref Range   WBC 7.8 4.0 - 10.5 K/uL   RBC 3.56 (L) 3.87 - 5.11 MIL/uL   Hemoglobin 10.6 (L) 12.0 - 15.0 g/dL   HCT 34.4 (L) 36 - 46 %   MCV 96.6 80.0 - 100.0 fL   MCH 29.8 26.0 - 34.0 pg   MCHC 30.8 30.0  - 36.0 g/dL   RDW 13.9 11.5 - 15.5 %   Platelets 301 150 - 400 K/uL   nRBC 0.0 0.0 - 0.2 %   Neutrophils Relative % 63 %   Neutro Abs 5.0 1.7 - 7.7 K/uL   Lymphocytes Relative 25 %   Lymphs Abs 2.0 0.7 - 4.0 K/uL   Monocytes Relative 11 %   Monocytes Absolute 0.8 0 - 1 K/uL   Eosinophils Relative 1 %   Eosinophils Absolute 0.1 0 - 0 K/uL   Basophils Relative 0 %   Basophils Absolute 0.0 0 - 0 K/uL   Immature Granulocytes 0 %   Abs Immature Granulocytes 0.01 0.00 -  0.07 K/uL    Comment: Performed at [facility], [address] [place], [place] [address]    Studies/Results:  CT  C,T,L SPINE FINDINGS: CT CERVICAL SPINE FINDINGS  Alignment: Straightening of the normal cervical lordosis. Trace anterolisthesis of C7 on T1, likely chronic and facet mediated. No malalignment.  Skull base and vertebrae: Skull base intact. Normal C1-2 articulations are preserved in the dens is intact. Vertebral body heights maintained. No acute fracture.  Soft tissues and spinal canal: Soft tissues of the neck demonstrate no acute finding. No abnormal prevertebral edema. Spinal canal within normal limits. Vascular calcifications about the carotid bifurcations.  Disc levels: Prominent degenerative changes noted about the C1-2 articulation. Mild cervical spondylosis noted at C4-5 through C6-7 without significant stenosis. Moderate multilevel left-sided facet hypertrophy.  Upper chest: Visualized upper chest demonstrates no acute finding. Partially visualized lung apices are clear.  Other: None.  CT THORACIC SPINE FINDINGS  Alignment: Mild scoliosis. Alignment otherwise normal with preservation of the normal thoracic kyphosis. No listhesis or malalignment.  Vertebrae: Compression deformity involving what is favored to be the T5 vertebral body with up to 50% height loss without significant bony retropulsion, chronic in appearance. Vertebral body height otherwise maintained without  acute fracture. No discrete or worrisome osseous lesions. Visualized ribs are grossly intact.  Paraspinal and other soft tissues: Paraspinous soft tissues within normal limits. Partially visualized lungs are grossly clear. Aortic atherosclerosis.  Disc levels: No significant disc pathology seen within the thoracic spine. No appreciable stenosis.  CT LUMBAR SPINE FINDINGS  Segmentation: Standard.  Alignment: Trace dextroscoliosis. Alignment otherwise normal with preservation of the normal lumbar lordosis. No listhesis or malalignment.  Vertebrae: Vertebral body height maintained without acute or chronic fracture. Visualized sacrum and pelvis intact. SI joints approximated symmetric. No worrisome osseous lesions.  Paraspinal and other soft tissues: Paraspinous soft tissues demonstrate no acute finding. Aortic atherosclerosis. Diverticulosis noted about the partially visualized sigmoid colon. Visualized visceral structures otherwise unremarkable.  Disc levels:  L1-2:  Unremarkable.  L2-3:  Unremarkable.  L3-4: No significant disc bulge. Mild facet hypertrophy. No stenosis.  L4-5: Minimal disc bulge. Bilateral facet hypertrophy. No significant stenosis.  L5-S1: Degenerative intervertebral disc space narrowing with diffuse disc bulge and reactive endplate change. Moderate facet hypertrophy. No significant spinal stenosis. Mild bilateral L5 foraminal narrowing.  IMPRESSION: 1. No acute traumatic injury within the cervical, thoracic, or lumbar spine. 2. 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 3. 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 4.  Aortic Atherosclerosis (ICD10-I70        Zohar Maroney A. Merlene Laughter, M.D.  Diplomate, Tax adviser of Psychiatry and Neurology ( Neurology). 11/25/2019, 10:00 AM

## 2019-11-25 NOTE — Progress Notes (Signed)
OT Cancellation Note  Patient Details Name: Christina Fry MRN: [id number] DOB: [birth year]   Cancelled Treatment:    Reason Eval/Treat Not Completed: Other (comment). Pt eating breakfast upon therapy arrival and declined to participate in OT evaluation so she may eat. Will re-attempt evaluation at a later time.     Ailene Ravel, OTR/L,CBIS  [phone]  11/25/2019, 9:32 AM

## 2019-11-25 NOTE — TOC Initial Note (Signed)
Transition of Care [facility]) - Initial/Assessment Note    Patient Details  Name: Christina Fry MRN: [id number] Date of Birth: November 15, 1940  Transition of [facility]) CM/SW Contact:    Iona Beard, [place] Phone Number: 11/25/2019, 3:34 PM  Clinical Narrative:                 Pt admitted for generalized weakness. CSW spoke with pts [name]. Per PT evaluation SNF placement recommended as pt requires significant assistance for ADL's requiring rehab. CSW spoke with daughter as she is understanding of pts VA benefits. CSW informed pts daughter that SNFs in system with VA may cause some limitations in gaining bed offers. Pts daughter understanding and agreed for referral to be sent out to [place] and [employer] Pts daughter accepted bed offer at [facility]. TOC to follow.   Expected Discharge Plan: Skilled Nursing Facility Barriers to Discharge: Continued Medical Work up   Patient Goals and CMS Choice Patient states their goals for this hospitalization and ongoing recovery are:: Go to SNF CMS Medicare.gov Compare Post Acute Care list provided to:: Patient Represenative (must comment) [name] (daughter)) Choice offered to / list presented to : Adult Children  Expected Discharge Plan and Services Expected Discharge Plan: [place] Acute Care Choice: [place] arrangements for the past 2 months: Single Family Home                 DME Arranged: N/A DME Agency: NA       HH Arranged: NA HH Agency: NA        Prior Living Arrangements/Services Living arrangements for the past 2 months: [place] with:: Self Patient language and need for interpreter reviewed:: Yes Do you feel safe going back to the place where you live?: Yes        Care giver support system in place?: Yes (comment)   Criminal Activity/Legal Involvement Pertinent to Current Situation/Hospitalization: No - Comment as  needed  Activities of Daily Living Home Assistive Devices/Equipment: Cane (specify quad or straight), Shower chair with back, Walker (specify type) ADL Screening (condition at time of admission) Patient's cognitive ability adequate to safely complete daily activities?: Yes Is the patient deaf or have difficulty hearing?: No Does the patient have difficulty seeing, even when wearing glasses/contacts?: No Does the patient have difficulty concentrating, remembering, or making decisions?: No Patient able to express need for assistance with ADLs?: Yes Does the patient have difficulty dressing or bathing?: Yes Independently performs ADLs?: No Communication: Independent Dressing (OT): Needs assistance Is this a change from baseline?: Change from baseline, expected to last <3days Grooming: Independent Feeding: Independent Bathing: Needs assistance Is this a change from baseline?: Change from baseline, expected to last <3 days Toileting: Needs assistance Is this a change from baseline?: Change from baseline, expected to last <3 days In/Out Bed: Needs assistance Is this a change from baseline?: Change from baseline, expected to last <3 days Walks in Home: Independent with device (comment) Does the patient have difficulty walking or climbing stairs?: Yes Weakness of Legs: Both Weakness of Arms/Hands: Both  Permission Sought/Granted                  Emotional Assessment       Orientation: : Oriented to Self, Oriented to Place, Oriented to  Time, Oriented to Situation   Psych Involvement: No (comment)  Admission diagnosis:  TIA (transient ischemic attack) [G45.9] Swelling [R60.9]  Weakness [R53.1] CVA (cerebral vascular accident) ([place]) [I63.9] Generalized weakness [R53.1] Patient Active Problem List   Diagnosis Date Noted  . Generalized weakness 11/23/2019  . PMR (polymyalgia rheumatica) (HCC) 11/23/2019  . GERD (gastroesophageal reflux disease) 11/23/2019  . Fibromyalgia  11/23/2019  . Essential hypertension 11/23/2019  . Hypothyroidism 11/23/2019  . Ambulatory dysfunction 11/23/2019  . Accident due to mechanical fall without injury 11/23/2019  . TIA (transient ischemic attack) 11/23/2019  . Seasonal and perennial allergic rhinitis 04/04/2018  . Mild intermittent asthma without complication 15/07/6977   PCP:  Ferdie Ping, MD Pharmacy:   [facility], [place] [address] Phone: [phone] Fax: [phone]     Social Determinants of Health (SDOH) Interventions    Readmission Risk Interventions No flowsheet data found.

## 2019-11-25 NOTE — Evaluation (Signed)
Speech Language Pathology Evaluation Patient Details Name: Christina Fry MRN: [id number] DOB: 08-22-1940 Today's Date: 11/25/2019 Time: 2774-1287 SLP Time Calculation (min) (ACUTE ONLY): 26 min  Problem List:  Patient Active Problem List   Diagnosis Date Noted  . Generalized weakness 11/23/2019  . PMR (polymyalgia rheumatica) (HCC) 11/23/2019  . GERD (gastroesophageal reflux disease) 11/23/2019  . Fibromyalgia 11/23/2019  . Essential hypertension 11/23/2019  . Hypothyroidism 11/23/2019  . Ambulatory dysfunction 11/23/2019  . Accident due to mechanical fall without injury 11/23/2019  . TIA (transient ischemic attack) 11/23/2019  . Seasonal and perennial allergic rhinitis 04/04/2018  . Mild intermittent asthma without complication 86/76/7209   Past Medical History:  Past Medical History:  Diagnosis Date  . Anxiety   . Cancer ([place])   . Diabetes mellitus without complication ([place])   . Fibromyalgia   . GERD (gastroesophageal reflux disease)   . Grave's disease   . Herpes   . Hyperlipidemia   . Mild intermittent asthma without complication 4/70/9628  . Palpitation   . Polymyalgia rheumatica (HCC)    Past Surgical History:  Past Surgical History:  Procedure Laterality Date  . ABDOMINAL HYSTERECTOMY    . APPENDECTOMY    . CARPAL TUNNEL RELEASE    . HERNIA REPAIR    . INCISIONAL HERNIA REPAIR  03/20/2012   Procedure: LAPAROSCOPIC INCISIONAL HERNIA;  Surgeon: Jamesetta So, MD;  Location: [facility];  Service: General;  Laterality: N/A;  . INSERTION OF MESH  03/20/2012   Procedure: INSERTION OF MESH;  Surgeon: Jamesetta So, MD;  Location: [facility];  Service: General;  Laterality: N/A;  . TOTAL ABDOMINAL HYSTERECTOMY W/ BILATERAL SALPINGOOPHORECTOMY     HPI:  Christina Fry is a 79 y.o. female with PMH of polymyalgia rheumatica, fibromyalgia, GERD, hypertension, hypothyroidism. Patient presented to the ED on 11/22/2019 with progressively worsening generalized weakness, shakiness and  multiple tender points in the body for last few days culminating to a fall on 9/10. At baseline, patient ambulates with a walker but she has been avoiding ambulating last few days in fear of falling.  She also complains of worsening pain and swelling of both lower extremities and also complains of body pain and back pain even on coughing.  Since the night before, patient also has blurring of vision on the left eye. She has a history of polymyalgia rheumatica with an acute flare of 10 years ago, ended up in an LTAC for intensive rehab.  She improved with prednisone and had to take it for 3 to 4 years and gradually tapered off it. [facility] shows: No acute intracranial process. Moderate cerebral atrophy. Advanced chronic microvascular ischemic changes. SLE requested.   Assessment / Plan / Recommendation Clinical Impression  SLE administered to Pt with daughter in room. Pt reports that she has noticed some difficulty finding her words when expressing complex thoughts (she stated assumed age related but worse after sickness 10/25/19). She also stated that he left eyelid started drooping around the same time as well (she was seen at the [place]). Current cognitive linguistic skills are Christina Fry with testing presented. She reports xerostomia and consumes moist solids with greater ease at home (microwave meals). She is an avid reader, but has not felt well enough to read lately. While she did not exhibit word finding difficulties today, Pt indicates that she frequently has a circumlocutory pattern when expressing complex thoughts. If Pt goes to SNF for PT needs, SLP can see her there if warranted.  Pt and family are in agreement with plan.     SLP Assessment  SLP Recommendation/Assessment: Patient does not need any further Speech Lanaguage Pathology Services SLP Visit Diagnosis: Cognitive communication deficit (R41.841)    Follow Up Recommendations  None    Frequency and Duration           SLP  Evaluation Cognition  Overall Cognitive Status: Within Functional Limits for tasks assessed Arousal/Alertness: Awake/alert Orientation Level: Oriented X4 Memory: Appears intact (Recalled 4 of 4 words after 5 minute delay) Awareness: Appears intact Problem Solving: Appears intact Safety/Judgment: Appears intact       Comprehension  Auditory Comprehension Overall Auditory Comprehension: Appears within functional limits for tasks assessed Yes/No Questions: Within Functional Limits Commands: Within Functional Limits Conversation: Complex Visual Recognition/Discrimination Discrimination: Within Function Limits Reading Comprehension Reading Status: Not tested (Pt is an avid reader)    Expression Expression Primary Mode of Expression: Verbal Verbal Expression Overall Verbal Expression: Appears within functional limits for tasks assessed Initiation: No impairment Automatic Speech: Name;Social Response;Month of year Level of Generative/Spontaneous Verbalization: Conversation Repetition: No impairment Naming: No impairment Pragmatics: No impairment Non-Verbal Means of Communication: Not applicable Written Expression Dominant Hand: Right Written Expression: Not tested   Oral / Motor  Oral Motor/Sensory Function Overall Oral Motor/Sensory Function: Within functional limits Motor Speech Overall Motor Speech: Appears within functional limits for tasks assessed Respiration: Within functional limits Phonation: Normal Resonance: Within functional limits Articulation: Within functional limitis Intelligibility: Intelligible Motor Planning: Witnin functional limits Motor Speech Errors: Not applicable    Thank you,  Genene Churn, [place]                  [place] 11/25/2019, 3:01 PM

## 2019-11-26 DIAGNOSIS — J3089 Other allergic rhinitis: Secondary | ICD-10-CM

## 2019-11-26 LAB — RPR: RPR Ser Ql: NONREACTIVE

## 2019-11-26 LAB — GLUCOSE, CAPILLARY
Glucose-Capillary: 208 mg/dL — ABNORMAL HIGH (ref 70–99)
Glucose-Capillary: 96 mg/dL (ref 70–99)

## 2019-11-26 LAB — HOMOCYSTEINE: Homocysteine: 17.9 umol/L (ref 0.0–21.3)

## 2019-11-26 MED ORDER — INSULIN ASPART 100 UNIT/ML ~~LOC~~ SOLN
0.0000 [IU] | Freq: Every day | SUBCUTANEOUS | Status: DC
  Administered 2019-11-28: 2 [IU] via SUBCUTANEOUS
  Administered 2019-11-29: 3 [IU] via SUBCUTANEOUS
  Administered 2019-11-30 – 2019-12-08 (×6): 2 [IU] via SUBCUTANEOUS
  Administered 2019-12-09: 3 [IU] via SUBCUTANEOUS

## 2019-11-26 MED ORDER — INSULIN ASPART 100 UNIT/ML ~~LOC~~ SOLN
0.0000 [IU] | Freq: Three times a day (TID) | SUBCUTANEOUS | Status: DC
  Administered 2019-11-26: 3 [IU] via SUBCUTANEOUS
  Administered 2019-11-27: 2 [IU] via SUBCUTANEOUS
  Administered 2019-11-28: 1 [IU] via SUBCUTANEOUS
  Administered 2019-11-28: 3 [IU] via SUBCUTANEOUS
  Administered 2019-11-29 (×2): 1 [IU] via SUBCUTANEOUS
  Administered 2019-11-30: 2 [IU] via SUBCUTANEOUS
  Administered 2019-12-01 – 2019-12-02 (×2): 1 [IU] via SUBCUTANEOUS
  Administered 2019-12-02 – 2019-12-04 (×3): 2 [IU] via SUBCUTANEOUS
  Administered 2019-12-05 – 2019-12-06 (×3): 1 [IU] via SUBCUTANEOUS
  Administered 2019-12-06: 3 [IU] via SUBCUTANEOUS
  Administered 2019-12-07 – 2019-12-08 (×3): 2 [IU] via SUBCUTANEOUS
  Administered 2019-12-09: 1 [IU] via SUBCUTANEOUS
  Administered 2019-12-09: 2 [IU] via SUBCUTANEOUS
  Administered 2019-12-10: 1 [IU] via SUBCUTANEOUS

## 2019-11-26 MED ORDER — HYDROXYZINE HCL 10 MG PO TABS
10.0000 mg | ORAL_TABLET | Freq: Two times a day (BID) | ORAL | Status: DC | PRN
  Administered 2019-11-26: 10 mg via ORAL
  Filled 2019-11-26 (×2): qty 1

## 2019-11-26 MED ORDER — POLYVINYL ALCOHOL 1.4 % OP SOLN
1.0000 [drp] | OPHTHALMIC | Status: DC | PRN
  Administered 2019-12-03: 1 [drp] via OPHTHALMIC
  Filled 2019-11-26: qty 15

## 2019-11-26 MED ORDER — ASPIRIN 81 MG PO CHEW
81.0000 mg | CHEWABLE_TABLET | Freq: Every day | ORAL | Status: DC
  Administered 2019-11-27 – 2019-12-10 (×14): 81 mg via ORAL
  Filled 2019-11-26 (×14): qty 1

## 2019-11-26 NOTE — Progress Notes (Signed)
Patient refusing bed alarm. Patient educated. Charge nurse notified.

## 2019-11-26 NOTE — Evaluation (Signed)
Occupational Therapy Evaluation Patient Details Name: Christina Fry MRN: [id number] DOB: 06-08-1940 Today's Date: 11/26/2019    History of Present Illness Christina Fry is a 79 y.o. female with medical history significant for PMR, fibromyalgia, GERD, hypertension, hypothyroidism who presents to the emergency department due to generalized weakness and fall sustained at home.  She complained of 2-day onset of sudden increase in shaking episodes, this was associated with generalized tender points in her back and lower extremities.  Patient ambulates with a walker at baseline, and she complained of difficulty in being able to ambulate within last 2 days, an attempt to ambulate resulted in her fall due to difficulty in being able to lift her feet from the ground due to weakness.  She went to an urgent care, but since the result for blood work would not be available for 2-4 days, [facility], but decided to go to ED for further evaluation due to the fall.Patient endorsed recent severe coughing fit with resultant increased back pain, however, this would not explain the shakes in her arms and legs.  She states that she had similar presentation several years ago and ended up being in [place] for intensive rehab, prior to being able to regain ability to ambulate.  Patient did complain of blurry vision last night when she was going to text her daughter and she ended up dictating her text.   Clinical Impression   Pt marginally agreeable to OT evaluation this am. Pt performing ADLs in sitting with set-up, no difficulty with functional tasks while seated. Pt requiring supervision/min guard for transfer to chair, declined to attempt mobility to the sink or bathroom. Pt reports she is "going to rehab to walk and that is it." Recommend SNF on discharge to improve safety and independence in ADLs and functional mobility. No further acute OT services required at this time.     Follow Up  Recommendations  SNF    Equipment Recommendations  None recommended by OT       Precautions / Restrictions Precautions Precautions: Fall Restrictions Weight Bearing Restrictions: No      Mobility Bed Mobility Overal bed mobility: Modified Independent Bed Mobility: Supine to Sit     Supine to sit: Modified independent (Device/Increase time)        Transfers Overall transfer level: Needs assistance Equipment used: None Transfers: Sit to/from [facility] Sit to Stand: Supervision Stand pivot transfers: Supervision;Min guard                ADL either performed or assessed with clinical judgement   ADL Overall ADL's : Needs assistance/impaired Eating/Feeding: Modified independent;Sitting   Grooming: Set up;Sitting           Upper Body Dressing : Modified independent;Sitting   Lower Body Dressing: Supervision/safety;Sitting/lateral leans   Toilet Transfer: Min Statistician Details (indicate cue type and reason): simulated with bed to chair transfer, pt standing and pivoting to chair using arm rests           General ADL Comments: pt declining to attempt mobility to restroom or sink. While sitting is able to perform tasks without difficulty     Vision Baseline Vision/History: Wears glasses Wears Glasses: At all times Patient Visual Report: No change from baseline Vision Assessment?: No apparent visual deficits            Pertinent Vitals/Pain Pain Assessment: No/denies pain     Hand Dominance Right   Extremity/Trunk Assessment  Upper Extremity Assessment Upper Extremity Assessment: Generalized weakness   Lower Extremity Assessment Lower Extremity Assessment: Defer to PT evaluation   Cervical / Trunk Assessment Cervical / Trunk Assessment: Normal   Communication Communication Communication: No difficulties   Cognition Arousal/Alertness: Awake/alert Behavior During Therapy: WFL for tasks  assessed/performed Overall Cognitive Status: Within Functional Limits for tasks assessed                                                Home Living Family/patient expects to be discharged to:: Skilled nursing facility Living Arrangements: Alone Available Help at Discharge: Family;Available PRN/intermittently Type of Home: House Home Access: Ramped entrance     Home Layout: One level               Home Equipment: [place] - 4 wheels;Cane - single point;Shower seat;Bedside commode   Additional Comments: uses adjustable bed  Lives With: Daughter    Prior Functioning/Environment Level of Independence: Needs assistance  Gait / Transfers Assistance Needed: household ambulator using Rollator ADL's / Homemaking Assistance Needed: family assist with community ADLs            OT Problem List: Decreased strength;Decreased activity tolerance;Impaired balance (sitting and/or standing);Decreased safety awareness;Decreased knowledge of use of DME or AE      OT Treatment/Interventions:      OT Goals(Current goals can be found in the care plan section) Acute Rehab OT Goals Patient Stated Goal: return home after rehab                   End of Session    Activity Tolerance: Patient tolerated treatment well Patient left: in chair;with call bell/phone within reach  OT Visit Diagnosis: Repeated falls (R29.6);Muscle weakness (generalized) (M62.81)                Time: 6438-3818 OT Time Calculation (min): 12 min Charges:  OT General Charges $OT Visit: 1 Visit OT Evaluation $OT Eval Low Complexity: 1 Low  Guadelupe Sabin, OTR/L  [phone] 11/26/2019, 10:13 AM

## 2019-11-26 NOTE — Progress Notes (Signed)
Physical Therapy Treatment Patient Details Name: Christina Fry MRN: [id number] DOB: June 10, 1940 Today's Date: 11/26/2019    History of Present Illness 79 y.o. female with medical history significant for PMR, fibromyalgia, GERD, hypertension, hypothyroidism who presents to the emergency department due to generalized weakness and fall sustained at home.  She complained of 2-day onset of sudden increase in shaking episodes, this was associated with generalized tender points in her back and lower extremities.  Patient ambulates with a walker at baseline, and she complained of difficulty in being able to ambulate within last 2 days, an attempt to ambulate resulted in her fall due to difficulty in being able to lift her feet from the ground due to weakness.  She went to an urgent care, but since the result for blood work would not be available for 2-4 days, [facility], but decided to go to ED for further evaluation due to the fall.Patient endorsed recent severe coughing fit with resultant increased back pain, however, this would not explain the shakes in her arms and legs.  She states that she had similar presentation several years ago and ended up being in [place] for intensive rehab, prior to being able to regain ability to ambulate.  Patient did complain of blurry vision last night when she was going to text her daughter and she ended up dictating her text.    PT Comments    Pt standing at counter attempting to wash hands upon therapist's arrival. Pt attempts to perform STS reps from [facility], tolerates 3 reps before increase in shaking/tremors and pain, so unable to perform subsequent reps. Pt adamant on lateral sidestepping and reaching onto furniture with BUE to maintain steadiness, demonstrates flexed posture and active abd/add of BLE with little hip flexion. Therapist attempts to educate pt on safety with ambulation, but pt with guarded reception to suggestions and reports this  is how she can walk when she has "flare ups". Therapist educated pt on coming down to sidelying to allow R hip abduction to raise into bed with good carryover initially, but requires min A with return to supine with fatigue. Pt noted to have active L hip flexion when returning to supine in bed. Patient will benefit from continued physical therapy in hospital and recommendations below to increase strength, balance, endurance for safe ADLs and gait.   Follow Up Recommendations  SNF     Equipment Recommendations  None recommended by PT    Recommendations for Other Services       Precautions / Restrictions Precautions Precautions: Fall Restrictions Weight Bearing Restrictions: No    Mobility  Bed Mobility Overal bed mobility: Needs Assistance Bed Mobility: Supine to Sit;Sit to Supine  Supine to sit: Modified independent (Device/Increase time) Sit to supine: Min assist;Min guard   General bed mobility comments: educated pt on log rolling to utliize abd/add strength due to flex/ext weakness throughout BLE; mod I with sitting up and coming to seated EOB, min G with initial sit to supine transfer to prevent RLE from falling off bed, progressed to min A with subsequent reps due to fatigue and pain  Transfers Overall transfer level: Needs assistance Equipment used: Rolling walker (2 wheeled) Transfers: Sit to/from Stand Sit to Stand: Min guard Stand pivot transfers: Supervision;Min guard    General transfer comment: min G with BUE assisting to power up, pt adamant she doesn't want assist from therapist  Ambulation/Gait Ambulation/Gait assistance: Min assist Gait Distance (Feet): 10 Feet Assistive device:  (  furniture walking) Gait Pattern/deviations: Decreased stride length;Trunk flexed (sidestepping/cruising along furniture) Gait velocity: decreased   General Gait Details: pt demonstrates lateral walking with BUE on furniture in "cruising" pattern despite education from therapist to  improve safety, pt denies active hip flexion and knee extension abilities, maintains flexed posture with trunk and knees flexed throughout gait cycle, pt declines forward ambulation attempt or use of RW/HHA while ambulating despite encouragement   Stairs             Wheelchair Mobility    Modified Rankin (Stroke Patients Only)       Balance Overall balance assessment: Needs assistance Sitting-balance support: Feet supported;No upper extremity supported Sitting balance-Leahy Scale: Fair Sitting balance - Comments: seated EOB   Standing balance support: During functional activity;Bilateral upper extremity supported Standing balance-Leahy Scale: Poor Standing balance comment: reliant on UE support     Cognition Arousal/Alertness: Awake/alert Behavior During Therapy: WFL for tasks assessed/performed Overall Cognitive Status: Within Functional Limits for tasks assessed        Exercises Other Exercises Other Exercises: STS, x3 reps with BUE assisting to power up    General Comments        Pertinent Vitals/Pain Pain Assessment: Faces Faces Pain Scale: Hurts whole lot Pain Location: BLE Pain Descriptors / Indicators: Discomfort;Grimacing;Sharp;Shooting;Sore Pain Intervention(s): Limited activity within patient's tolerance;Monitored during session;Repositioned    Home Living Family/patient expects to be discharged to:: Skilled nursing facility Living Arrangements: Alone Available Help at Discharge: Family;Available PRN/intermittently Type of Home: House Home Access: Ramped entrance   Home Layout: One level Home Equipment: [place] - 4 wheels;Cane - single point;Shower seat;Bedside commode Additional Comments: uses adjustable bed    Prior Function Level of Independence: Needs assistance  Gait / Transfers Assistance Needed: household ambulator using Rollator ADL's / Homemaking Assistance Needed: family assist with community ADLs     PT Goals (current goals can now  be found in the care plan section) Acute Rehab PT Goals Patient Stated Goal: return home after rehab PT Goal Formulation: With patient Time For Goal Achievement: 12/07/19 Potential to Achieve Goals: Good Progress towards PT goals: Progressing toward goals    Frequency    Min 3X/week      PT Plan Current plan remains appropriate    Co-evaluation              AM-PAC PT "6 Clicks" Mobility   Outcome Measure  Help needed turning from your back to your side while in a flat bed without using bedrails?: A Little Help needed moving from lying on your back to sitting on the side of a flat bed without using bedrails?: A Little Help needed moving to and from a bed to a chair (including a wheelchair)?: A Lot Help needed standing up from a chair using your arms (e.g., wheelchair or bedside chair)?: A Lot Help needed to walk in hospital room?: A Lot Help needed climbing 3-5 steps with a railing? : Total 6 Click Score: 13    End of Session Equipment Utilized During Treatment: Gait belt Activity Tolerance: Patient tolerated treatment well;Patient limited by fatigue Patient left: in bed;with call bell/phone within reach Nurse Communication: Mobility status PT Visit Diagnosis: Unsteadiness on feet (R26.81);Other abnormalities of gait and mobility (R26.89);Muscle weakness (generalized) (M62.81)     Time: 9702-6378 PT Time Calculation (min) (ACUTE ONLY): 21 min  Charges:  $Therapeutic Activity: 8-22 mins  Talbot Grumbling PT, DPT 11/26/19, 12:58 PM [phone]

## 2019-11-26 NOTE — Progress Notes (Addendum)
Christina A. Merlene Laughter, MD     [email or web address]          Christina Fry is an 79 y.o. female.   ASSESSMENT/PLAN: 1. ACUTE GAIT IMPAIRMENT, LEG WEAKNESS, TREMORS ON FALLING:  The workup has been mostly unrevealing for structural lesion to explain her symptoms.   She has preserved reflexes which argues against Guillain-Barre syndrome or other forms of severe neuropathy. It appears that her symptoms started chronologically after she was given medication for abdominal symptoms raising the possibility that at least part of her symptoms could be due to medication effect. Given her past history of polymyalgia rheumatica causing similar presentation, I suspect this may also be causative. The tremors are somewhat different than her previous presentation indicate that she may have a baseline movement disorder.  High-dose steroids are recommended. 2.  SEVERE LEG PAIN:  Again, this is most likely due to polymyalgia rheumatica. High-dose steroids are recommended for about 3 days.  3. Subacute tremors worsened acutely after being treated for abdominal symptoms. This will be observed for now. I suspect that she may have had medications that cause the tremors to deteriorate but she has improved spontaneously with discontinuation of these medications. Her baseline medications does not suggest typical culprits that could cause tremors. She is on low-dose antidepressants which I suspect are not causative at this time. 4. 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     She reports that things are overall about the same still with SEVERE LEG PAIN. She ambulated to restroom with walker though. She did have increased tremors this morning during the evaluation. The tremors are nonspecific high-frequency low-amplitude. I did review her medications given at the [place] and the she got most of laxatives including lactulose but no other medications such as metoclopramide to cause tremors. The  patient was to have high-dose Solu-Medrol yesterday but in overtly the 1st dose was not given until this morning. We will see how she does. I think she will need at least 3 days.     GENERAL:  This is the pleasant female who is doing well at this time.  HEENT:  Neck is supple no trauma noted.  ABDOMEN: Soft  EXTREMITIES: No edema; says severe arthritic changes of the knees bilaterally. There is severe pain with even subtle palpation and manipulation of the legs bilaterally especially the knees and ankles. No swelling is noted however. No discoloration.   BACK: Normal alignment.  SKIN: Normal by inspection.    MENTAL STATUS: Alert and oriented. Speech, language and cognition are generally intact. Judgment and insight normal.   CRANIAL NERVES: Pupils are equal, round and reactive to light and accommodation; extraocular movements are full, there is no significant nystagmus; upper and lower facial muscles are normal in strength and symmetric, there is no flattening of the nasolabial folds; tongue is midline; uvula is midline; shoulder elevation is normal.  MOTOR: Normal tone, bulk and strength in the arms; no pronator drift.  Says severe leg weakness graded as 2/5 associated with severe pain. Bulk and tone are normal throughout.  COORDINATION: Left finger to nose is normal, right finger to nose is normal, mild rest tremor; no intention tremor; mild postural tremor; no bradykinesia.  REFLEXES: Deep tendon reflexes are symmetrical and mildly reduced to normal throughout. and normal.   SENSATION: Normal to light touch and temperature.    Blood pressure (!) 130/52, pulse 63, temperature (!) 97.5 F (36.4 C), temperature source Oral, resp. rate 20, height 5'  3" (1.6 m), weight 73 kg, SpO2 95 %.  Past Medical History:  Diagnosis Date  . Anxiety   . Cancer ([place])   . Diabetes mellitus without complication ([place])   . Fibromyalgia   . GERD (gastroesophageal reflux disease)   . Grave's  disease   . Herpes   . Hyperlipidemia   . Mild intermittent asthma without complication 9/93/7169  . Palpitation   . Polymyalgia rheumatica ([facility])     Past Surgical History:  Procedure Laterality Date  . ABDOMINAL HYSTERECTOMY    . APPENDECTOMY    . CARPAL TUNNEL RELEASE    . HERNIA REPAIR    . INCISIONAL HERNIA REPAIR  03/20/2012   Procedure: LAPAROSCOPIC INCISIONAL HERNIA;  Surgeon: Jamesetta So, MD;  Location: [facility];  Service: General;  Laterality: N/A;  . INSERTION OF MESH  03/20/2012   Procedure: INSERTION OF MESH;  Surgeon: Jamesetta So, MD;  Location: [facility];  Service: General;  Laterality: N/A;  . TOTAL ABDOMINAL HYSTERECTOMY W/ BILATERAL SALPINGOOPHORECTOMY      Family History  Problem Relation Age of Onset  . Allergic rhinitis Neg Hx   . Asthma Neg Hx   . Eczema Neg Hx   . Urticaria Neg Hx   . Angioedema Neg Hx   . Atopy Neg Hx   . Immunodeficiency Neg Hx     Social History:  reports that she has never smoked. She has never used smokeless tobacco. She reports that she does not drink alcohol and does not use drugs.  Allergies:  Allergies  Allergen Reactions  . Iohexol Shortness Of Breath    Pt stopped breathing at [place]. Pt refuses IV dye  . Codeine Itching  . Morphine And Related Itching  . Nitrofuran Derivatives Other (See Comments)    Unknown  . Oxycodone-Acetaminophen Itching    Medications: Prior to Admission medications   Medication Sig Start Date End Date Taking? Authorizing Provider  acetaminophen (TYLENOL) 325 MG tablet Take 650 mg by mouth in the morning and at bedtime.    Yes [provider]  albuterol (VENTOLIN HFA) 108 (90 Base) MCG/ACT inhaler Inhale 3 puffs into the lungs every 6 (six) hours as needed for wheezing or shortness of breath.   Yes [provider]  atorvastatin (LIPITOR) 20 MG tablet Take 20 mg by mouth at bedtime.   Yes [provider]  carboxymethylcellulose 1 % ophthalmic solution Apply 1-4  drops to eye every 2 (two) hours while awake. 3-4 drops in right eye, 1-2 drops in left eye every 2 hours until bedtime.   Yes [provider]  Docusate Sodium 100 MG capsule Take 100 mg by mouth 2 (two) times daily.   Yes [provider]  EPINEPHrine 0.3 mg/0.3 mL IJ SOAJ injection Inject 0.3 mg into the muscle as needed for anaphylaxis.   Yes [provider]  fluticasone (FLONASE) 50 MCG/ACT nasal spray Place 1 spray into both nostrils 2 (two) times daily. 04/05/18  Yes Valentina Shaggy, MD  gabapentin (NEURONTIN) 100 MG capsule Take 200 mg by mouth at bedtime.     Yes [provider]  lactulose (CHRONULAC) 10 GM/15ML solution Take 10 g by mouth in the morning.   Yes [provider]  levothyroxine (SYNTHROID, LEVOTHROID) 88 MCG tablet Take 88 mcg by mouth daily before breakfast. 30 minutes prior to meal or patient will not eat meal   Yes [provider]  loratadine (CLARITIN) 10 MG tablet Take 10 mg by  mouth daily.   Yes [provider]  metoprolol tartrate (LOPRESSOR) 25 MG tablet Take 12.5 mg by mouth 2 (two) times daily.    Yes [provider]  nortriptyline (PAMELOR) 25 MG capsule Take 25 mg by mouth at bedtime.     Yes [provider]  omeprazole (PRILOSEC) 20 MG capsule Take 20 mg by mouth 2 (two) times daily.    Yes [provider]  traMADol (ULTRAM) 50 MG tablet Take 100 mg by mouth daily as needed for severe pain.   Yes [provider]    Scheduled Meds: .  stroke: mapping our early stages of recovery book   Does not apply Once  . [START ON 11/27/2019] aspirin  81 mg Oral Daily  . atorvastatin  20 mg Oral QHS  . calcium-vitamin D  1 tablet Oral Q breakfast  . cholecalciferol  400 Units Oral Daily  . docusate sodium  100 mg Oral BID  . enoxaparin (LOVENOX) injection  40 mg Subcutaneous Q24H  . fluticasone  1 spray Each Nare BID  . gabapentin  300 mg Oral TID  . lactulose  10 g Oral  q AM  . levothyroxine  88 mcg Oral QAC breakfast  . loratadine  10 mg Oral Daily  . metoprolol tartrate  25 mg Oral BID  . nortriptyline  25 mg Oral QHS  . pantoprazole (PROTONIX) IV  40 mg Intravenous Q24H   Continuous Infusions: . methylPREDNISolone (SOLU-MEDROL) injection 500 mg (11/26/19 0938)   PRN Meds:.acetaminophen, ondansetron, traMADol     Results for orders placed or performed during the hospital encounter of 11/22/19 (from the past 48 hour(s))  RPR     Status: None   Collection Time: 11/25/19 10:20 AM  Result Value Ref Range   RPR Ser Ql NON REACTIVE NON REACTIVE    Comment: Performed at [facility], [address], [place], [place] [address]  Vitamin B12     Status: None   Collection Time: 11/25/19 10:20 AM  Result Value Ref Range   Vitamin B-12 269 180 - 914 pg/mL    Comment: (NOTE) This assay is not validated for testing neonatal or myeloproliferative syndrome specimens for Vitamin B12 levels. Performed at [facility], [address], [place], [place] [address]   TSH     Status: None   Collection Time: 11/25/19 10:20 AM  Result Value Ref Range   TSH 1.176 0.350 - 4.500 uIU/mL    Comment: Performed by a 3rd Generation assay with a functional sensitivity of <=0.01 uIU/mL. Performed at [facility], [address], [place], [place] [address]     Studies/Results:  CT  C,T,L SPINE FINDINGS: CT CERVICAL SPINE FINDINGS  Alignment: Straightening of the normal cervical lordosis. Trace anterolisthesis of C7 on T1, likely chronic and facet mediated. No malalignment.  Skull base and vertebrae: Skull base intact. Normal C1-2 articulations are preserved in the dens is intact. Vertebral body heights maintained. No acute fracture.  Soft tissues and spinal canal: Soft tissues of the neck demonstrate no acute finding. No abnormal prevertebral edema. Spinal canal within normal limits. Vascular calcifications about the carotid bifurcations.  Disc levels:  Prominent degenerative changes noted about the C1-2 articulation. Mild cervical spondylosis noted at C4-5 through C6-7 without significant stenosis. Moderate multilevel left-sided facet hypertrophy.  Upper chest: Visualized upper chest demonstrates no acute finding. Partially visualized lung apices are clear.  Other: None.  CT THORACIC SPINE FINDINGS  Alignment: Mild scoliosis. Alignment otherwise normal with  preservation of the normal thoracic kyphosis. No listhesis or malalignment.  Vertebrae: Compression deformity involving what is favored to be the T5 vertebral body with up to 50% height loss without significant bony retropulsion, chronic in appearance. Vertebral body height otherwise maintained without acute fracture. No discrete or worrisome osseous lesions. Visualized ribs are grossly intact.  Paraspinal and other soft tissues: Paraspinous soft tissues within normal limits. Partially visualized lungs are grossly clear. Aortic atherosclerosis.  Disc levels: No significant disc pathology seen within the thoracic spine. No appreciable stenosis.  CT LUMBAR SPINE FINDINGS  Segmentation: Standard.  Alignment: Trace dextroscoliosis. Alignment otherwise normal with preservation of the normal lumbar lordosis. No listhesis or malalignment.  Vertebrae: Vertebral body height maintained without acute or chronic fracture. Visualized sacrum and pelvis intact. SI joints approximated symmetric. No worrisome osseous lesions.  Paraspinal and other soft tissues: Paraspinous soft tissues demonstrate no acute finding. Aortic atherosclerosis. Diverticulosis noted about the partially visualized sigmoid colon. Visualized visceral structures otherwise unremarkable.  Disc levels:  L1-2:  Unremarkable.  L2-3:  Unremarkable.  L3-4: No significant disc bulge. Mild facet hypertrophy. No stenosis.  L4-5: Minimal disc bulge. Bilateral facet hypertrophy. No significant  stenosis.  L5-S1: Degenerative intervertebral disc space narrowing with diffuse disc bulge and reactive endplate change. Moderate facet hypertrophy. No significant spinal stenosis. Mild bilateral L5 foraminal narrowing.  IMPRESSION: 1. No acute traumatic injury within the cervical, thoracic, or lumbar spine. 2. 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 3. 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 4.  Aortic Atherosclerosis (ICD10-I70        Kunio Cummiskey A. Merlene Fry, M.D.  Diplomate, Tax adviser of Psychiatry and Neurology ( Neurology). 11/26/2019, 11:53 AM

## 2019-11-26 NOTE — Progress Notes (Addendum)
PROGRESS NOTE  Christina Fry  DOB: Mar 09, 1941  PCP: Ferdie Ping, MD HQI:[id number]  DOA: 11/22/2019  LOS: 3 days   Chief Complaint  Patient presents with  . Weakness   Brief narrative: Christina Fry is a 79 y.o. female with PMH of polymyalgia rheumatica, fibromyalgia, GERD, hypertension, hypothyroidism. Patient presented to the ED on 11/22/2019 with progressively worsening generalized weakness, shakiness and multiple tender points in the body for last few days culminating to a fall on 9/10. At baseline, patient ambulates with a walker but she has been avoiding ambulating last few days in fear of falling.  She also complains of worsening pain and swelling of both lower extremities and also complains of body pain and back pain even on coughing.  Since the night before, patient also has blurring of vision on the left eye. She has a history of polymyalgia rheumatica with an acute flare of 10 years ago, ended up in an LTAC for intensive rehab.  She improved with prednisone and had to take it for 3 to 4 years and gradually tapered off it. [facility]  In the ED, patient was hemodynamically stable. Labs unremarkable.   CT scan of head, cervical spine, thoracic spine, lumbar spine did not show any acute abnormality.  Chronic T5 compression fracture was noted.   Chest x-ray showed emphysematous and chronic bronchitis changes in the lungs with no evidence of active pulmonary disease.  Patient was admitted under hospitalist service for further evaluation management  Subjective: Patient was seen and examined this morning.   Lying down in bed.  Not in distress.   Continues to have weakness and pain on her lower extremities.  She states she can walk sideways but cannot take her steps forwards.  Assessment/Plan: Acute neurological symptoms -Presented with few days of progressively worsening generalized weakness, shakiness and blurring of vision and a fall -Stroke ruled out with MRI  brain.  Continue primary prophylaxis with patient is on aspirin 81 mg daily and statin. -Neurology consult appreciated.  Symptoms likely secondary to flare up of PMR.  Acute flareup of polymyalgia rheumatica -She has a history of polymyalgia rheumatica with a history of acute flare of 10 years ago, ended up in an LTAC for intensive rehab.  She improved with prednisone and had to take it for 3 to 4 years and gradually tapered off it. -Patient has chronic pain in both lower extremities  -Presented with new symptoms of generalized tremors and bilateral lower extremity weakness.  Able to walk sideways but not forwards.  Reports similar symptoms during last flareup -Currently on high-dose of Solu-Medrol 500 mg IV daily per neurology recommendation. -Continue Tylenol 650 mg p.o. every 6 hours as needed for pain -Also on increased dose of gabapentin 300 mg 3 times daily  Potential need of long-term steroids -According to patient's daughter, patient was on prednisone for about 3 years after her last flareup.  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  -There is chance patient will be reinitiated on long course of steroids at this time as well.  -For now, will start on sliding-scale insulin and IV Protonix.  Bilateral lower extremity swelling and pain -Suspect secondary to PMR.  Ultrasound duplex of lower extremities ruled out DVT.  Fibromyalgia -Continue gabapentin and nortriptyline per home regimen  Dehydration -Gentle IV hydration given in the ED.  Encourage oral intake  Essential hypertension -Continue Lopressor per  home regimen  Hypothyroidism -Continue Synthroid per home regimen  GERD -Continue Protonix  Mobility: PT eval obtained Code Status:   Code Status: Full Code  Nutritional status: Body mass index is 28.52 kg/m.     Diet Order            Diet Heart Room service appropriate? Yes; Fluid  consistency: Thin  Diet effective now                DVT prophylaxis: enoxaparin (LOVENOX) injection 40 mg Start: 11/23/19 1000 SCDs Start: 11/23/19 0323   Antimicrobials:  None Fluid: None Consultants: Neurology  Family Communication: called and updated patient's daughter Ms. [name] on the phone.  Status is: Inpatient  Remains inpatient appropriate because:Ongoing active pain requiring inpatient pain management and Ongoing diagnostic testing needed not appropriate for outpatient work up   Dispo: The patient is from: Home              Anticipated d/c is to: SNF              Anticipated d/c date is: 3 days              Patient currently is not medically stable to d/c.            Infusions:  . methylPREDNISolone (SOLU-MEDROL) injection 500 mg (11/26/19 1610)    Scheduled Meds: .  stroke: mapping our early stages of recovery book   Does not apply Once  . [START ON 11/27/2019] aspirin  81 mg Oral Daily  . atorvastatin  20 mg Oral QHS  . calcium-vitamin D  1 tablet Oral Q breakfast  . cholecalciferol  400 Units Oral Daily  . docusate sodium  100 mg Oral BID  . enoxaparin (LOVENOX) injection  40 mg Subcutaneous Q24H  . fluticasone  1 spray Each Nare BID  . gabapentin  300 mg Oral TID  . insulin aspart  0-5 Units Subcutaneous QHS  . insulin aspart  0-9 Units Subcutaneous TID WC  . lactulose  10 g Oral q AM  . levothyroxine  88 mcg Oral QAC breakfast  . loratadine  10 mg Oral Daily  . metoprolol tartrate  25 mg Oral BID  . nortriptyline  25 mg Oral QHS  . pantoprazole (PROTONIX) IV  40 mg Intravenous Q24H    Antimicrobials: Anti-infectives (From admission, onward)   None      PRN meds: acetaminophen, ondansetron, traMADol   Objective: Vitals:   11/25/19 2121 11/26/19 0555  BP: (!) 157/68 (!) 130/52  Pulse: 82 63  Resp: 18 20  Temp: 98.1 F (36.7 C) (!) 97.5 F (36.4 C)  SpO2: 98% 95%    Intake/Output Summary (Last 24 hours) at 11/26/2019 1155 Last data  filed at 11/25/2019 1800 Gross per 24 hour  Intake 480 ml  Output --  Net 480 ml   Filed Weights   11/22/19 1842  Weight: 73 kg   Weight change:  Body mass index is 28.52 kg/m.   Physical Exam: General exam: Appears calm and comfortable. Not in physical distress  Skin: No rashes, lesions or ulcers. HEENT: Atraumatic, normocephalic, supple neck, no obvious bleeding Lungs: Clear to auscultation bilaterally CVS: Regular rate and rhythm, no murmur GI/Abd soft, nontender, nondistended, bowel sound present CNS: Alert, awake, oriented x3, generalized weakness.  Tremors improved.  Psychiatry: Mood appropriate Extremities: Continues have significant tenderness on both lower extremities.  Tremors seem to be improving.  I did not check her gait but patient states she  can walk only sideways  Data Review: I have personally reviewed the laboratory data and studies available.  Recent Labs  Lab 11/22/19 2020 11/23/19 0514 11/24/19 0813  WBC 10.4 9.1 7.8  NEUTROABS  --   --  5.0  HGB 13.0 11.9* 10.6*  HCT 41.1 38.3 34.4*  MCV 97.4 96.7 96.6  PLT 348 315 301   Recent Labs  Lab 11/22/19 2020 11/23/19 0514 11/24/19 0813  NA 137 141 134*  K 3.8 3.5 3.9  CL 101 105 102  CO2 26 26 26   GLUCOSE 109* 100* 92  BUN 14 12 11   CREATININE 1.09* 0.97 0.97  CALCIUM 9.6 9.1 8.9  MG  --  1.8  --   PHOS  --  3.1  --     F/u labs ordered  Signed, Terrilee Croak, MD [facility] 11/26/2019

## 2019-11-27 DIAGNOSIS — R29898 Other symptoms and signs involving the musculoskeletal system: Secondary | ICD-10-CM

## 2019-11-27 LAB — GLUCOSE, CAPILLARY
Glucose-Capillary: 96 mg/dL (ref 70–99)
Glucose-Capillary: 77 mg/dL (ref 70–99)
Glucose-Capillary: 177 mg/dL — ABNORMAL HIGH (ref 70–99)
Glucose-Capillary: 131 mg/dL — ABNORMAL HIGH (ref 70–99)

## 2019-11-27 MED ORDER — HYDROCODONE-ACETAMINOPHEN 5-325 MG PO TABS
1.0000 | ORAL_TABLET | Freq: Four times a day (QID) | ORAL | Status: DC | PRN
Start: 2019-11-27 — End: 2019-11-27

## 2019-11-27 MED ORDER — HYDROCODONE-ACETAMINOPHEN 5-325 MG PO TABS
1.0000 | ORAL_TABLET | Freq: Three times a day (TID) | ORAL | Status: DC
  Administered 2019-11-27 – 2019-12-10 (×36): 1 via ORAL
  Filled 2019-11-27 (×37): qty 1

## 2019-11-27 NOTE — Progress Notes (Signed)
PROGRESS NOTE  Christina Fry  DOB: Nov 29, 1940  PCP: Ferdie Ping, MD VHQ:[id number]  DOA: 11/22/2019  LOS: 4 days   Chief Complaint  Patient presents with  . Weakness   Brief narrative: Christina Fry is a 79 y.o. female with PMH of polymyalgia rheumatica, fibromyalgia, GERD, hypertension, hypothyroidism. Patient presented to the ED on 11/22/2019 with progressively worsening generalized weakness, shakiness and multiple tender points in the body for last few days culminating to a fall on 9/10. At baseline, patient ambulates with a walker but she has been avoiding ambulating last few days in fear of falling.  She also complains of worsening pain and swelling of both lower extremities and also complains of body pain and back pain even on coughing.  Since the night before, patient also has blurring of vision on the left eye. She has a history of polymyalgia rheumatica with an acute flare of 10 years ago, ended up in an LTAC for intensive rehab.  She improved with prednisone and had to take it for 3 to 4 years and gradually tapered off it. [facility]  In the ED, patient was hemodynamically stable. Labs unremarkable.   CT scan of head, cervical spine, thoracic spine, lumbar spine did not show any acute abnormality.  Chronic T5 compression fracture was noted.   Chest x-ray showed emphysematous and chronic bronchitis changes in the lungs with no evidence of active pulmonary disease.  Patient was admitted under hospitalist service for further evaluation management  Subjective: Patient was seen and examined this morning.   I saw her walking back from bathroom to the bed.  Still pretty weak.  States that her leg pain got worse last night.  Assessment/Plan: Acute flareup of polymyalgia rheumatica -Presented with few days of progressively worsening bilateral lower extremities pain, generalized weakness, shakiness blurring of vision and a fall.  Able to walk sideways but not  forwards. -She has a history of polymyalgia rheumatica with chronic bilateral lower extremity pain.  She last had a flare up of 10 years ago, ended up in an LTAC for intensive rehab.  She improved with prednisone and had to take it for 3 to 4 years and gradually tapered off it. -Neurology consultation obtained.  Patient needs currently on high-dose of Solu-Medrol 500 mg IV daily -Reports inadequate pain control with Tylenol.  I will start her on scheduled Norco 5 mg every 8 hours for the next 1 to 2 days and switch to as needed. -Continue increased dose of gabapentin 300 mg 3 times daily -Continue to monitor mental status.  Stroke ruled out -Normal MRI brain. Continue primary prophylaxis with patient is on aspirin 81 mg daily and statin.  Potential need of long-term steroids -According to patient's daughter, patient was on prednisone for about 3 years after her last flareup.  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  -There is chance patient will be reinitiated on long course of steroids at this time as well.  -Currently I have her on IV Protonix and sliding scale insulin with Accu-Cheks.  Bilateral lower extremity swelling and pain -Suspect secondary to PMR.  Ultrasound duplex of lower extremities ruled out DVT.  Fibromyalgia -Continue gabapentin and nortriptyline.  Essential hypertension -Continue Lopressor per home regimen  Hypothyroidism -Continue Synthroid per home regimen  GERD -Continue Protonix  Mobility: PT eval obtained Code Status:   Code Status: Full Code  Nutritional status: Body  mass index is 28.52 kg/m.     Diet Order            Diet Heart Room service appropriate? Yes; Fluid consistency: Thin  Diet effective now                DVT prophylaxis: enoxaparin (LOVENOX) injection 40 mg Start: 11/23/19 1000 SCDs Start: 11/23/19 0323   Antimicrobials:  None Fluid:  None Consultants: Neurology  Family Communication: called and updated patient's daughter Ms. [name] on the phone on 9/14  Status is: Inpatient  Remains inpatient appropriate because:Ongoing active pain requiring inpatient pain management and Ongoing diagnostic testing needed not appropriate for outpatient work up   Dispo: The patient is from: Home              Anticipated d/c is to: SNF              Anticipated d/c date is: 3 days              Patient currently is not medically stable to d/c.            Infusions:  . methylPREDNISolone (SOLU-MEDROL) injection 500 mg (11/27/19 1151)    Scheduled Meds: .  stroke: mapping our early stages of recovery book   Does not apply Once  . aspirin  81 mg Oral Daily  . atorvastatin  20 mg Oral QHS  . calcium-vitamin D  1 tablet Oral Q breakfast  . cholecalciferol  400 Units Oral Daily  . docusate sodium  100 mg Oral BID  . enoxaparin (LOVENOX) injection  40 mg Subcutaneous Q24H  . fluticasone  1 spray Each Nare BID  . gabapentin  300 mg Oral TID  . insulin aspart  0-5 Units Subcutaneous QHS  . insulin aspart  0-9 Units Subcutaneous TID WC  . lactulose  10 g Oral q AM  . levothyroxine  88 mcg Oral QAC breakfast  . loratadine  10 mg Oral Daily  . metoprolol tartrate  25 mg Oral BID  . nortriptyline  25 mg Oral QHS  . pantoprazole (PROTONIX) IV  40 mg Intravenous Q24H    Antimicrobials: Anti-infectives (From admission, onward)   None      PRN meds: acetaminophen, hydrOXYzine, ondansetron, polyvinyl alcohol, traMADol   Objective: Vitals:   11/27/19 0122 11/27/19 0512  BP:  (!) 145/88  Pulse:  72  Resp:  20  Temp: 98.1 F (36.7 C) 98.2 F (36.8 C)  SpO2:  99%    Intake/Output Summary (Last 24 hours) at 11/27/2019 1227 Last data filed at 11/27/2019 0930 Gross per 24 hour  Intake 298.82 ml  Output --  Net 298.82 ml   Filed Weights   11/22/19 1842  Weight: 73 kg   Weight change:  Body mass index is 28.52 kg/m.    Physical Exam: General exam: Appears calm and comfortable.  In distress because of worsening lower extremity pain. Skin: No rashes, lesions or ulcers. HEENT: Atraumatic, normocephalic, supple neck, no obvious bleeding Lungs: Clear to auscultation bilaterally CVS: Regular rate and rhythm, no murmur GI/Abd soft, nontender, nondistended, bowel sound present CNS: Alert, awake, oriented x3, generalized weakness.  Tremors improved.  Psychiatry: Depressed look today Extremities: Continues have significant tenderness on both lower extremities.    Data Review: I have personally reviewed the laboratory data and studies available.  Recent Labs  Lab 11/22/19 2020 11/23/19 0514 11/24/19 0813  WBC 10.4 9.1 7.8  NEUTROABS  --   --  5.0  HGB  13.0 11.9* 10.6*  HCT 41.1 38.3 34.4*  MCV 97.4 96.7 96.6  PLT 348 315 301   Recent Labs  Lab 11/22/19 2020 11/23/19 0514 11/24/19 0813  NA 137 141 134*  K 3.8 3.5 3.9  CL 101 105 102  CO2 26 26 26   GLUCOSE 109* 100* 92  BUN 14 12 11   CREATININE 1.09* 0.97 0.97  CALCIUM 9.6 9.1 8.9  MG  --  1.8  --   PHOS  --  3.1  --     F/u labs ordered  Signed, Terrilee Croak, MD [facility] 11/27/2019

## 2019-11-27 NOTE — TOC Progression Note (Signed)
Transition of Care [facility]) - Progression Note    Patient Details  Name: JANICA ELDRED MRN: [id number] Date of Birth: 1940/04/18  Transition of Care [facility]) CM/SW Contact  Salome Arnt, [place] Phone Number: 11/27/2019, 10:25 AM  Clinical Narrative:  Marlinda Mike updated [facility] on pt. Per admissions, pt's family has already completed admission paperwork. TOC will continue to follow.       Expected Discharge Plan: [place] Barriers to Discharge: Continued Medical Work up  Expected Discharge Plan and Services Expected Discharge Plan: [place] Choice: [place] arrangements for the past 2 months: Single Family Home                 DME Arranged: N/A DME Agency: NA       HH Arranged: NA HH Agency: NA         Social Determinants of Health (SDOH) Interventions    Readmission Risk Interventions No flowsheet data found.

## 2019-11-28 LAB — GLUCOSE, CAPILLARY
Glucose-Capillary: 224 mg/dL — ABNORMAL HIGH (ref 70–99)
Glucose-Capillary: 216 mg/dL — ABNORMAL HIGH (ref 70–99)

## 2019-11-28 MED ORDER — SODIUM CHLORIDE 0.9 % IV SOLN
500.0000 mg | Freq: Once | INTRAVENOUS | Status: AC
  Administered 2019-11-28: 500 mg via INTRAVENOUS
  Filled 2019-11-28: qty 4

## 2019-11-28 NOTE — Progress Notes (Signed)
Christina A. Merlene Laughter, MD     [email or web address]          Christina Fry is an 79 y.o. female.   ASSESSMENT/PLAN: 1. ACUTE GAIT IMPAIRMENT, LEG WEAKNESS, TREMORS ON FALLING: Thought to be due to polymyalgia rheumatica as work-up as a been otherwise unrevealing in the given her history of this in the past.  High-dose steroids are recommended.  We will continue with this.  I think she has 1 more day but I will give her an extra dose today of 500 mg Solu-Medrol. 2.  SEVERE LEG PAIN:  Again, this is most likely due to polymyalgia rheumatica. High-dose steroids are recommended for about 3 days.  3. Subacute tremors worsened acutely after being treated for abdominal symptoms. This will be observed for now. I suspect that she may have had medications that cause the tremors to deteriorate but she has improved spontaneously with discontinuation of these medications. Her baseline medications does not suggest typical culprits that could cause tremors. She is on low-dose antidepressants which I suspect are not causative at this time. 4. 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     She has gotten a couple of days of high-dose steroids 500 mg of Solu-Medrol.  She tells me that she did well yesterday and for the early part of today.  She still has significant weakness however and the pain of the lower extremities.  She reports feeling severe fatigue receiving starting about 12 noon.     GENERAL:  This is the pleasant female who is doing well at this time.  HEENT:  Neck is supple no trauma noted.  ABDOMEN: Soft  EXTREMITIES: No edema; says severe arthritic changes of the knees bilaterally. There is severe pain with even subtle palpation and manipulation of the legs bilaterally especially the knees and ankles. No swelling is noted however. No discoloration.   BACK: Normal alignment.  SKIN: Normal by inspection.    MENTAL STATUS: Alert and oriented. Speech,  language and cognition are generally intact. Judgment and insight normal.   CRANIAL NERVES: Pupils are equal, round and reactive to light and accommodation; extraocular movements are full, there is no significant nystagmus; upper and lower facial muscles are normal in strength and symmetric, there is no flattening of the nasolabial folds; tongue is midline; uvula is midline; shoulder elevation is normal.  MOTOR: Normal tone, bulk and strength in the arms; no pronator drift.  Says severe leg weakness graded as 2/5 associated with severe pain. Bulk and tone are normal throughout.  COORDINATION: Left finger to nose is normal, right finger to nose is normal, mild rest tremor; no intention tremor; mild postural tremor; no bradykinesia.  REFLEXES: Deep tendon reflexes are symmetrical and mildly reduced to normal throughout. and normal.   SENSATION: Normal to light touch and temperature.    Blood pressure (!) 140/57, pulse 70, temperature 98.5 F (36.9 C), temperature source Oral, resp. rate 19, height 5\' 3"  (1.6 m), weight 73 kg, SpO2 97 %.  Past Medical History:  Diagnosis Date  . Anxiety   . Cancer ([place])   . Diabetes mellitus without complication ([place])   . Fibromyalgia   . GERD (gastroesophageal reflux disease)   . Grave's disease   . Herpes   . Hyperlipidemia   . Mild intermittent asthma without complication 2/42/6834  . Palpitation   . Polymyalgia rheumatica ([facility])     Past Surgical History:  Procedure Laterality Date  . ABDOMINAL HYSTERECTOMY    .  APPENDECTOMY    . CARPAL TUNNEL RELEASE    . HERNIA REPAIR    . INCISIONAL HERNIA REPAIR  03/20/2012   Procedure: LAPAROSCOPIC INCISIONAL HERNIA;  Surgeon: Jamesetta So, MD;  Location: [facility];  Service: General;  Laterality: N/A;  . INSERTION OF MESH  03/20/2012   Procedure: INSERTION OF MESH;  Surgeon: Jamesetta So, MD;  Location: [facility];  Service: General;  Laterality: N/A;  . TOTAL ABDOMINAL HYSTERECTOMY W/ BILATERAL  SALPINGOOPHORECTOMY      Family History  Problem Relation Age of Onset  . Allergic rhinitis Neg Hx   . Asthma Neg Hx   . Eczema Neg Hx   . Urticaria Neg Hx   . Angioedema Neg Hx   . Atopy Neg Hx   . Immunodeficiency Neg Hx     Social History:  reports that she has never smoked. She has never used smokeless tobacco. She reports that she does not drink alcohol and does not use drugs.  Allergies:  Allergies  Allergen Reactions  . Iohexol Shortness Of Breath    Pt stopped breathing at [place]. Pt refuses IV dye  . Codeine Itching  . Morphine And Related Itching  . Nitrofuran Derivatives Other (See Comments)    Unknown  . Oxycodone-Acetaminophen Itching    Medications: Prior to Admission medications   Medication Sig Start Date End Date Taking? Authorizing Provider  acetaminophen (TYLENOL) 325 MG tablet Take 650 mg by mouth in the morning and at bedtime.    Yes [provider]  albuterol (VENTOLIN HFA) 108 (90 Base) MCG/ACT inhaler Inhale 3 puffs into the lungs every 6 (six) hours as needed for wheezing or shortness of breath.   Yes [provider]  atorvastatin (LIPITOR) 20 MG tablet Take 20 mg by mouth at bedtime.   Yes [provider]  carboxymethylcellulose 1 % ophthalmic solution Apply 1-4 drops to eye every 2 (two) hours while awake. 3-4 drops in right eye, 1-2 drops in left eye every 2 hours until bedtime.   Yes [provider]  Docusate Sodium 100 MG capsule Take 100 mg by mouth 2 (two) times daily.   Yes [provider]  EPINEPHrine 0.3 mg/0.3 mL IJ SOAJ injection Inject 0.3 mg into the muscle as needed for anaphylaxis.   Yes [provider]  fluticasone (FLONASE) 50 MCG/ACT nasal spray Place 1 spray into both nostrils 2 (two) times daily. 04/05/18  Yes Valentina Shaggy, MD  gabapentin (NEURONTIN) 100 MG capsule Take 200 mg by mouth at bedtime.     Yes [provider]  lactulose (CHRONULAC) 10 GM/15ML  solution Take 10 g by mouth in the morning.   Yes [provider]  levothyroxine (SYNTHROID, LEVOTHROID) 88 MCG tablet Take 88 mcg by mouth daily before breakfast. 30 minutes prior to meal or patient will not eat meal   Yes [provider]  loratadine (CLARITIN) 10 MG tablet Take 10 mg by mouth daily.   Yes [provider]  metoprolol tartrate (LOPRESSOR) 25 MG tablet Take 12.5 mg by mouth 2 (two) times daily.    Yes [provider]  nortriptyline (PAMELOR) 25 MG capsule Take 25 mg by mouth at bedtime.     Yes [provider]  omeprazole (PRILOSEC) 20 MG capsule Take 20 mg by mouth 2 (two) times daily.    Yes [provider]  traMADol (ULTRAM) 50 MG tablet Take 100 mg by mouth daily as needed for severe pain.  Yes [provider]    Scheduled Meds: .  stroke: mapping our early stages of recovery book   Does not apply Once  . aspirin  81 mg Oral Daily  . atorvastatin  20 mg Oral QHS  . calcium-vitamin D  1 tablet Oral Q breakfast  . cholecalciferol  400 Units Oral Daily  . docusate sodium  100 mg Oral BID  . enoxaparin (LOVENOX) injection  40 mg Subcutaneous Q24H  . fluticasone  1 spray Each Nare BID  . gabapentin  300 mg Oral TID  . HYDROcodone-acetaminophen  1 tablet Oral Q8H  . insulin aspart  0-5 Units Subcutaneous QHS  . insulin aspart  0-9 Units Subcutaneous TID WC  . lactulose  10 g Oral q AM  . levothyroxine  88 mcg Oral QAC breakfast  . loratadine  10 mg Oral Daily  . metoprolol tartrate  25 mg Oral BID  . nortriptyline  25 mg Oral QHS  . pantoprazole (PROTONIX) IV  40 mg Intravenous Q24H   Continuous Infusions: . methylPREDNISolone (SOLU-MEDROL) injection 500 mg (11/28/19 1214)   PRN Meds:.acetaminophen, hydrOXYzine, ondansetron, polyvinyl alcohol     Results for orders placed or performed during the hospital encounter of 11/22/19 (from the past 48 hour(s))  Glucose, capillary     Status: Abnormal    Collection Time: 11/26/19  4:57 PM  Result Value Ref Range   Glucose-Capillary 208 (H) 70 - 99 mg/dL    Comment: Glucose reference range applies only to samples taken after fasting for at least 8 hours.  Glucose, capillary     Status: None   Collection Time: 11/26/19  9:08 PM  Result Value Ref Range   Glucose-Capillary 96 70 - 99 mg/dL    Comment: Glucose reference range applies only to samples taken after fasting for at least 8 hours.  Glucose, capillary     Status: None   Collection Time: 11/27/19  7:20 AM  Result Value Ref Range   Glucose-Capillary 96 70 - 99 mg/dL    Comment: Glucose reference range applies only to samples taken after fasting for at least 8 hours.  Glucose, capillary     Status: None   Collection Time: 11/27/19 11:10 AM  Result Value Ref Range   Glucose-Capillary 77 70 - 99 mg/dL    Comment: Glucose reference range applies only to samples taken after fasting for at least 8 hours.  Glucose, capillary     Status: Abnormal   Collection Time: 11/27/19  4:21 PM  Result Value Ref Range   Glucose-Capillary 177 (H) 70 - 99 mg/dL    Comment: Glucose reference range applies only to samples taken after fasting for at least 8 hours.   Comment 1 Notify RN    Comment 2 Document in Chart   Glucose, capillary     Status: Abnormal   Collection Time: 11/27/19 10:14 PM  Result Value Ref Range   Glucose-Capillary 131 (H) 70 - 99 mg/dL    Comment: Glucose reference range applies only to samples taken after fasting for at least 8 hours.  Glucose, capillary     Status: Abnormal   Collection Time: 11/28/19  4:22 PM  Result Value Ref Range   Glucose-Capillary 224 (H) 70 - 99 mg/dL    Comment: Glucose reference range applies only to samples taken after fasting for at least 8 hours.    Studies/Results:  CT  C,T,L SPINE FINDINGS: CT CERVICAL SPINE FINDINGS  Alignment: Straightening of the normal cervical lordosis. Trace  anterolisthesis of C7 on T1, likely chronic and facet  mediated. No malalignment.  Skull base and vertebrae: Skull base intact. Normal C1-2 articulations are preserved in the dens is intact. Vertebral body heights maintained. No acute fracture.  Soft tissues and spinal canal: Soft tissues of the neck demonstrate no acute finding. No abnormal prevertebral edema. Spinal canal within normal limits. Vascular calcifications about the carotid bifurcations.  Disc levels: Prominent degenerative changes noted about the C1-2 articulation. Mild cervical spondylosis noted at C4-5 through C6-7 without significant stenosis. Moderate multilevel left-sided facet hypertrophy.  Upper chest: Visualized upper chest demonstrates no acute finding. Partially visualized lung apices are clear.  Other: None.  CT THORACIC SPINE FINDINGS  Alignment: Mild scoliosis. Alignment otherwise normal with preservation of the normal thoracic kyphosis. No listhesis or malalignment.  Vertebrae: Compression deformity involving what is favored to be the T5 vertebral body with up to 50% height loss without significant bony retropulsion, chronic in appearance. Vertebral body height otherwise maintained without acute fracture. No discrete or worrisome osseous lesions. Visualized ribs are grossly intact.  Paraspinal and other soft tissues: Paraspinous soft tissues within normal limits. Partially visualized lungs are grossly clear. Aortic atherosclerosis.  Disc levels: No significant disc pathology seen within the thoracic spine. No appreciable stenosis.  CT LUMBAR SPINE FINDINGS  Segmentation: Standard.  Alignment: Trace dextroscoliosis. Alignment otherwise normal with preservation of the normal lumbar lordosis. No listhesis or malalignment.  Vertebrae: Vertebral body height maintained without acute or chronic fracture. Visualized sacrum and pelvis intact. SI joints approximated symmetric. No worrisome osseous lesions.  Paraspinal and other soft  tissues: Paraspinous soft tissues demonstrate no acute finding. Aortic atherosclerosis. Diverticulosis noted about the partially visualized sigmoid colon. Visualized visceral structures otherwise unremarkable.  Disc levels:  L1-2:  Unremarkable.  L2-3:  Unremarkable.  L3-4: No significant disc bulge. Mild facet hypertrophy. No stenosis.  L4-5: Minimal disc bulge. Bilateral facet hypertrophy. No significant stenosis.  L5-S1: Degenerative intervertebral disc space narrowing with diffuse disc bulge and reactive endplate change. Moderate facet hypertrophy. No significant spinal stenosis. Mild bilateral L5 foraminal narrowing.  IMPRESSION: 1. No acute traumatic injury within the cervical, thoracic, or lumbar spine. 2. 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 3. 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 4.  Aortic Atherosclerosis (ICD10-I70        Jury Caserta A. Merlene Fry, M.D.  Diplomate, Tax adviser of Psychiatry and Neurology ( Neurology). 11/28/2019, 4:24 PM

## 2019-11-28 NOTE — Progress Notes (Signed)
PT Cancellation Note  Patient Details Name: Christina Fry MRN: [id number] DOB: Oct 03, 1940   Cancelled Treatment:    Reason Eval/Treat Not Completed: Other (comment);Patient declined, no reason specified (restroom and lunch). Therapist offered therapy x2 this morning; 1st attempt pt in restroom and 2nd attempt pt ambulating over to chair for lunch and requests therapist come back later. Will continue to check back as schedule permits.   Talbot Grumbling PT, DPT 11/28/19, 12:37 PM [phone]

## 2019-11-28 NOTE — Progress Notes (Signed)
PROGRESS NOTE  Christina Fry  DOB: 1940/08/09  PCP: Ferdie Ping, MD MVE:[id number]  DOA: 11/22/2019  LOS: 5 days   Chief Complaint  Patient presents with  . Weakness   Brief narrative: Christina Fry is a 79 y.o. female with PMH of polymyalgia rheumatica, fibromyalgia, GERD, hypertension, hypothyroidism. Patient presented to the ED on 11/22/2019 with progressively worsening generalized weakness, shakiness and multiple tender points in the body for last few days culminating to a fall on 9/10. At baseline, patient ambulates with a walker but she has been avoiding ambulating last few days in fear of falling.  She also complains of worsening pain and swelling of both lower extremities and also complains of body pain and back pain even on coughing.  Since the night before, patient also has blurring of vision on the left eye. She has a history of polymyalgia rheumatica with an acute flare of 10 years ago, ended up in an LTAC for intensive rehab.  She improved with prednisone and had to take it for 3 to 4 years and gradually tapered off it. [facility]  In the ED, patient was hemodynamically stable. Labs unremarkable.   CT scan of head, cervical spine, thoracic spine, lumbar spine did not show any acute abnormality.  Chronic T5 compression fracture was noted.   Chest x-ray showed emphysematous and chronic bronchitis changes in the lungs with no evidence of active pulmonary disease.  Patient was admitted under hospitalist service for further evaluation management  Subjective: Patient was seen and examined this morning.   Lying on bed.  Reports slight improvement in pain.    Assessment/Plan: Acute flareup of polymyalgia rheumatica -Presented with few days of progressively worsening bilateral lower extremities pain, generalized weakness, shakiness blurring of vision and a fall.  Able to walk sideways but not forwards. -She has a history of polymyalgia rheumatica with chronic  bilateral lower extremity pain.  She last had a flare up of 10 years ago, ended up in an LTAC for intensive rehab.  She improved with prednisone and had to take it for 3 to 4 years and gradually tapered off it. -Neurology consultation obtained.  Patient needs currently on high-dose of Solu-Medrol 500 mg IV daily -For pain control, she is scheduled Norco 5 mg every 8 hours.  We will change it to as needed once pain controlled -Continue increased dose of gabapentin 300 mg 3 times daily -Continue to monitor mental status.  Stroke ruled out -Normal MRI brain. Continue primary prophylaxis with patient is on aspirin 81 mg daily and statin.  Potential need of long-term steroids -According to patient's daughter, patient was on prednisone for about 3 years after her last flareup.  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  -There is chance patient will be reinitiated on long course of steroids at this time as well.  -Currently I have her on Protonix and sliding scale insulin with Accu-Cheks.  Bilateral lower extremity swelling and pain -Suspect secondary to PMR.  Ultrasound duplex of lower extremities ruled out DVT.  Fibromyalgia -Continue gabapentin and nortriptyline.  Essential hypertension -Continue Lopressor per home regimen  Hypothyroidism -Continue Synthroid per home regimen  GERD -Continue Protonix  Mobility: PT eval obtained Code Status:   Code Status: Full Code  Nutritional status: Body mass index is 28.52 kg/m.     Diet Order  Diet Heart Room service appropriate? Yes; Fluid consistency: Thin  Diet effective now                DVT prophylaxis: enoxaparin (LOVENOX) injection 40 mg Start: 11/23/19 1000 SCDs Start: 11/23/19 0323   Antimicrobials:  None Fluid: None Consultants: Neurology  Family Communication: called and updated patient's daughter Ms. [name] on the phone on  9/14  Status is: Inpatient  Remains inpatient appropriate because currently remains on high-dose IV steroids continues to have significant amount of pain and weakness.  Dispo: The patient is from: Home              Anticipated d/c is to: SNF              Anticipated d/c date is: 3 days              Patient currently is not medically stable to d/c.            Infusions:  . methylPREDNISolone (SOLU-MEDROL) injection 500 mg (11/27/19 1151)    Scheduled Meds: .  stroke: mapping our early stages of recovery book   Does not apply Once  . aspirin  81 mg Oral Daily  . atorvastatin  20 mg Oral QHS  . calcium-vitamin D  1 tablet Oral Q breakfast  . cholecalciferol  400 Units Oral Daily  . docusate sodium  100 mg Oral BID  . enoxaparin (LOVENOX) injection  40 mg Subcutaneous Q24H  . fluticasone  1 spray Each Nare BID  . gabapentin  300 mg Oral TID  . HYDROcodone-acetaminophen  1 tablet Oral Q8H  . insulin aspart  0-5 Units Subcutaneous QHS  . insulin aspart  0-9 Units Subcutaneous TID WC  . lactulose  10 g Oral q AM  . levothyroxine  88 mcg Oral QAC breakfast  . loratadine  10 mg Oral Daily  . metoprolol tartrate  25 mg Oral BID  . nortriptyline  25 mg Oral QHS  . pantoprazole (PROTONIX) IV  40 mg Intravenous Q24H    Antimicrobials: Anti-infectives (From admission, onward)   None      PRN meds: acetaminophen, hydrOXYzine, ondansetron, polyvinyl alcohol   Objective: Vitals:   11/28/19 0942 11/28/19 0955  BP:  (!) 149/56  Pulse:  74  Resp:  18  Temp:  98.5 F (36.9 C)  SpO2: 100%     Intake/Output Summary (Last 24 hours) at 11/28/2019 1116 Last data filed at 11/28/2019 0338 Gross per 24 hour  Intake 389.04 ml  Output --  Net 389.04 ml   Filed Weights   11/22/19 1842  Weight: 73 kg   Weight change:  Body mass index is 28.52 kg/m.   Physical Exam: General exam: Appears calm and comfortable.  Not in distress Skin: No rashes, lesions or ulcers. HEENT:  Atraumatic, normocephalic, supple neck, no obvious bleeding Lungs: Clear to auscultation bilaterally. CVS: Regular rate and rhythm, no murmur GI/Abd soft, nontender, nondistended, bowel sound present CNS: Alert, awake, oriented x3, generalized weakness.  Tremors continues Psychiatry: Mood appropriate Extremities: Continues have significant tenderness on both lower extremities.    Data Review: I have personally reviewed the laboratory data and studies available.  Recent Labs  Lab 11/22/19 2020 11/23/19 0514 11/24/19 0813  WBC 10.4 9.1 7.8  NEUTROABS  --   --  5.0  HGB 13.0 11.9* 10.6*  HCT 41.1 38.3 34.4*  MCV 97.4 96.7 96.6  PLT 348 315 301   Recent Labs  Lab 11/22/19 2020 11/23/19 0514  11/24/19 0813  NA 137 141 134*  K 3.8 3.5 3.9  CL 101 105 102  CO2 26 26 26   GLUCOSE 109* 100* 92  BUN 14 12 11   CREATININE 1.09* 0.97 0.97  CALCIUM 9.6 9.1 8.9  MG  --  1.8  --   PHOS  --  3.1  --     F/u labs ordered  Signed, Terrilee Croak, MD [facility] 11/28/2019

## 2019-11-29 DIAGNOSIS — R29898 Other symptoms and signs involving the musculoskeletal system: Secondary | ICD-10-CM | POA: Diagnosis present

## 2019-11-29 LAB — GLUCOSE, CAPILLARY
Glucose-Capillary: 132 mg/dL — ABNORMAL HIGH (ref 70–99)
Glucose-Capillary: 131 mg/dL — ABNORMAL HIGH (ref 70–99)
Glucose-Capillary: 206 mg/dL — ABNORMAL HIGH (ref 70–99)
Glucose-Capillary: 282 mg/dL — ABNORMAL HIGH (ref 70–99)

## 2019-11-29 LAB — BASIC METABOLIC PANEL
Anion gap: 8 (ref 5–15)
BUN: 21 mg/dL (ref 8–23)
CO2: 28 mmol/L (ref 22–32)
Calcium: 8.5 mg/dL — ABNORMAL LOW (ref 8.9–10.3)
Chloride: 102 mmol/L (ref 98–111)
Creatinine, Ser: 1.01 mg/dL — ABNORMAL HIGH (ref 0.44–1.00)
GFR calc Af Amer: >60 mL/min (ref >60)
GFR calc non Af Amer: 52 mL/min — ABNORMAL LOW (ref >60)
Glucose, Bld: 139 mg/dL — ABNORMAL HIGH (ref 70–99)
Potassium: 3.7 mmol/L (ref 3.5–5.1)
Sodium: 138 mmol/L (ref 135–145)

## 2019-11-29 LAB — SARS CORONAVIRUS 2 BY RT PCR (HOSPITAL ORDER, PERFORMED IN ~~LOC~~ HOSPITAL LAB): SARS Coronavirus 2: NEGATIVE

## 2019-11-29 LAB — CBC WITH DIFFERENTIAL/PLATELET
Abs Immature Granulocytes: 0.07 10*3/uL (ref 0.00–0.07)
Basophils Absolute: 0 10*3/uL (ref 0.0–0.1)
Basophils Relative: 0 %
Eosinophils Absolute: 0 10*3/uL (ref 0.0–0.5)
Eosinophils Relative: 0 %
HCT: 36.1 % (ref 36.0–46.0)
Hemoglobin: 11.4 g/dL — ABNORMAL LOW (ref 12.0–15.0)
Immature Granulocytes: 1 %
Lymphocytes Relative: 19 %
Lymphs Abs: 1.1 10*3/uL (ref 0.7–4.0)
MCH: 30.2 pg (ref 26.0–34.0)
MCHC: 31.6 g/dL (ref 30.0–36.0)
MCV: 95.5 fL (ref 80.0–100.0)
Monocytes Absolute: 0.2 10*3/uL (ref 0.1–1.0)
Monocytes Relative: 3 %
Neutro Abs: 4.5 10*3/uL (ref 1.7–7.7)
Neutrophils Relative %: 77 %
Platelets: 309 10*3/uL (ref 150–400)
RBC: 3.78 MIL/uL — ABNORMAL LOW (ref 3.87–5.11)
RDW: 14.1 % (ref 11.5–15.5)
WBC: 5.8 10*3/uL (ref 4.0–10.5)
nRBC: 0 % (ref 0.0–0.2)

## 2019-11-29 MED ORDER — INSULIN ASPART 100 UNIT/ML ~~LOC~~ SOLN: 0.0000 [IU] | Freq: Three times a day (TID) | SUBCUTANEOUS | 11 refills | Status: DC

## 2019-11-29 MED ORDER — ASPIRIN 81 MG PO CHEW: 81.0000 mg | CHEWABLE_TABLET | Freq: Every day | ORAL | Status: DC

## 2019-11-29 MED ORDER — INSULIN ASPART 100 UNIT/ML ~~LOC~~ SOLN: 0.0000 [IU] | Freq: Every day | SUBCUTANEOUS | 11 refills | Status: DC

## 2019-11-29 MED ORDER — HYDROXYZINE HCL 10 MG PO TABS: 10.0000 mg | ORAL_TABLET | Freq: Two times a day (BID) | ORAL | 0 refills | Status: AC | PRN

## 2019-11-29 MED ORDER — HYDROCODONE-ACETAMINOPHEN 5-325 MG PO TABS
1.0000 | ORAL_TABLET | Freq: Three times a day (TID) | ORAL | 0 refills | Status: DC
Start: 2019-11-29 — End: 2019-12-10

## 2019-11-29 MED ORDER — PREDNISONE 20 MG PO TABS: 40.0000 mg | ORAL_TABLET | Freq: Two times a day (BID) | ORAL | Status: DC

## 2019-11-29 MED ORDER — CALCIUM CARBONATE-VITAMIN D 500-200 MG-UNIT PO TABS: 1.0000 | ORAL_TABLET | Freq: Every day | ORAL | Status: DC

## 2019-11-29 NOTE — TOC Progression Note (Signed)
Transition of Care [facility]) - Progression Note   Patient Details  Name: GENEVIVE PRINTUP MRN: [id number] Date of Birth: 1941/02/03  Transition of Care [facility]) CM/SW [place], LCSW Phone Number: 11/29/2019, 4:30 PM  Clinical Narrative: Patient is unable to discharge to [facility] for SNF as it is not in-network with the patient's [place] [facility] in [place] with [place] updated. CSW spoke with Marcheta Grammes [phone]. [phone]), the transfer coordinator for the [facility]. Per Ms. Tyrell Antonio, a geriatric extended care packet must be completed in order for SNF to be approved by the [facility] and the patient can only transfer to a facility that is specifically in-network with the [facility]. Ms. Tyrell Antonio sent required documentation, which was completed by CSW with the patient's daughter, [name]. Requested paperwork and clinical documentation faxed to Ms. Tyrell Antonio [phone]), who confirmed receipt. TOC to follow; [facility].  Expected Discharge Plan: [place] Barriers to Discharge: Continued Medical Work up  Expected Discharge Plan and Services Expected Discharge Plan: [place] Choice: [place] arrangements for the past 2 months: Single Family Home Expected Discharge Date: 11/29/19               DME Arranged: N/A DME Agency: NA HH Arranged: NA [place] Agency: NA  Readmission Risk Interventions No flowsheet data found.

## 2019-11-29 NOTE — Discharge Summary (Signed)
Physician Discharge Summary  Christina Fry KYH:[id number] DOB: 1940-07-02 DOA: 11/22/2019  PCP: Ferdie Ping, MD  Admit date: 11/22/2019 Discharge date: 11/29/2019  Admitted From: Home Discharge disposition: SNF   Code Status: Full Code  Diet Recommendation: Cardiac/diabetic diet  Discharge Diagnosis:   Principal Problem:   Weakness of both lower extremities Active Problems:   Generalized weakness   PMR (polymyalgia rheumatica) (HCC)   GERD (gastroesophageal reflux disease)   Fibromyalgia   Essential hypertension   Hypothyroidism   Ambulatory dysfunction   Accident due to mechanical fall without injury   TIA (transient ischemic attack)   History of Present Illness / Brief narrative:  Christina Fry is a 79 y.o. female with PMH of polymyalgia rheumatica, fibromyalgia, GERD, hypertension, hypothyroidism. Patient presented to the ED on 11/22/2019 with progressively worsening generalized weakness, shakiness and multiple tender points in the body for last few days culminating to a fall on 9/10. At baseline, patient ambulates with a walker but she has been avoiding ambulating last few days in fear of falling.  She also complains of worsening pain and swelling of both lower extremities and also complains of body pain and back pain even on coughing.  Since the night before, patient also has blurring of vision on the left eye. She has a history of polymyalgia rheumatica with an acute flare of 10 years ago, ended up in an LTAC for intensive rehab.  She improved with prednisone and had to take it for 3 to 4 years and gradually tapered off it. [facility]  In the ED, patient was hemodynamically stable. Labs unremarkable.   CT scan of head, cervical spine, thoracic spine, lumbar spine did not show any acute abnormality.  Chronic T5 compression fracture was noted.   Chest x-ray showed emphysematous and chronic bronchitis changes in the lungs with no evidence of active pulmonary  disease.  Patient was admitted under hospitalist service for further evaluation management  Subjective:  Seen and examined this morning. Lying down in bed. Continues to have chronic bilateral lower extremity pain, somewhat better. Weakness gradually improving. Completed course of high-dose IV Solu-Medrol today.  Hospital Course:  Acute flareup of polymyalgia rheumatica -Presented with few days of progressively worsening bilateral lower extremities pain, generalized weakness, shakiness blurring of vision and a fall.  Able to walk sideways but not forwards. -She has a history of polymyalgia rheumatica with chronic bilateral lower extremity pain.  She last had a flare up of 10 years ago, ended up in an LTAC for intensive rehab.  She improved with prednisone and had to take it for 3 to 4 years and gradually tapered off it. -Neurology consultation was obtained..  Patient was treated with high-dose of Solu-Medrol 500 mg IV daily, last dose today. -Discussed with neurologist Dr. Merlene Laughter this morning.  Recommended to discharge the patient on prednisone 40 mg twice daily.  She most likely will need long-term steroids.  She would need to follow-up with neurologist as an outpatient for further determination. -For pain control, she will be discharged on Neurontin 600 mg 3 times daily, Norco 5 mg every 8 hours PRN.   -Continue to monitor mental status at SNF.  Stroke ruled out -Normal MRI brain. Continue primary prophylaxis with patient is on aspirin 81 mg daily and statin.  Long-term use of steroids -According to patient's daughter, patient was on prednisone for about 3 years after her last flareup.  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  -With the current flareup, patient has been reinitiated on prednisone 40 mg twice daily.  She will most likely need for long-term. -I will discharge her on insulin regimen,  scheduled PPI twice daily as well as calcium and vitamin D supplementation.   Hyperglycemia -A1c 5.6. -Blood sugar level is trending up because of steroids. -I will discharge her on sliding scale insulin..  She needs to have blood sugar monitored at SNF.  May need long-acting insulin started as well next several days. Recent Labs  Lab 11/27/19 2214 11/28/19 1622 11/28/19 2116 11/29/19 0727 11/29/19 1131  GLUCAP 131* 224* 216* 132* 131*   Bilateral lower extremity swelling and pain -Suspect secondary to PMR.  Ultrasound duplex of lower extremities ruled out DVT.  Fibromyalgia -Continue gabapentin, Norco and nortriptyline.  Essential hypertension -Continue Lopressor per home regimen  Hypothyroidism -Continue Synthroid per home regimen  GERD -Continue Protonix  Stable for discharge to SNF today.  Wound care: Incision 03/20/12 Abdomen Other (Comment) (Active)  Date First Assessed/Time First Assessed: 03/20/12 1023   Location: Abdomen  Location Orientation: Other (Comment)    Assessments 03/20/2012 11:39 AM 03/26/2012  8:00 AM  Site / Wound Assessment Clean;Dry Clean;Dry  Drainage Amount None None     No Linked orders to display     Incision - 3 Ports Abdomen 1: Left;Upper 2: Left;Lower 3: Right;Upper (Active)  Placement Date/Time: 03/20/12 1008   Location of Ports: Abdomen  Port: 1:  Location Orientation: Left;Upper  Port: 2:  Location Orientation: Left;Lower  Port: 3:  Location Orientation: Right;Upper    Assessments 03/20/2012 11:50 AM 03/26/2012  8:00 AM  Port 1 Site Assessment Clean;Dry Clean;Dry  Port 1 Drainage Amount None None  Port 2 Site Assessment Clean;Dry Clean;Dry  Port 2 Drainage Amount None None  Port 3 Site Assessment Clean;Dry Clean;Dry  Port 3 Drainage Amount None None     No Linked orders to display     Incision - 1 Port Abdomen 1: Right;Lower (Active)  Placement Date/Time: 03/20/12 1055   Location of Ports: Abdomen  Port: 1:  Location  Orientation: Right;Lower    Assessments 03/20/2012 11:50 AM 03/26/2012  8:00 AM  Port 1 Site Assessment Clean;Dry Clean;Dry  Port 1 Drainage Amount None None     No Linked orders to display     Incision - 5 Ports Abdomen Right;Upper Right;Lower Left;Upper Left;Lower Medial (Active)  Placement Date: 03/20/12   Location of Ports: Abdomen  Location Orientation: Right;Upper  Location Orientation: Right;Lower  Location Orientation: Left;Upper  Location Orientation: Left;Lower  Location Orientation: Medial    Assessments 03/21/2012 10:41 PM 03/22/2012 11:44 PM  Port 1 Site Assessment Clean;Dry --  Port 1 Drainage Amount None --  Port 1 Dressing Status Clean;Dry;Intact Clean;Dry  Port 2 Drainage Amount None --  Port 2 Dressing Status Clean;Dry;Intact Clean;Dry  Port 3 Drainage Amount None --  Port 3 Dressing Status Clean;Dry;Intact Clean;Dry  Port 4 Drainage Amount None --  Port 4 Dressing Status Clean;Dry;Intact --  Port 5 Drainage Amount None --  Port 5 Dressing Status Clean;Dry;Intact --     No Linked orders to display    Discharge Exam:   Vitals:   11/28/19 1352 11/28/19 2114 11/28/19 2118 11/29/19 0537  BP: (!) 140/57  (!) 146/68 132/61  Pulse: 70  73 66  Resp: 19   16  Temp: 98.5 F (36.9 C)  98.1 F (36.7 C) 97.9 F (36.6 C)  TempSrc:  Oral  Oral Oral  SpO2: 97% 92% 94% 97%  Weight:      Height:        Body mass index is 28.52 kg/m.  General exam: Appears calm and comfortable.  Skin: No rashes, lesions or ulcers. HEENT: Atraumatic, normocephalic, supple neck, no obvious bleeding Lungs: Clear to auscultation bilaterally CVS: Regular rate and rhythm, no murmur GI/Abd soft, nontender, nondistended, bowel sound present CNS: Alert, awake, oriented x3, continues to have weakness in lower extremities Psychiatry: Mood appropriate Extremities: Mild bilateral lower extremity swelling.  Follow ups:   Discharge Instructions    Ambulatory referral to Neurology   Complete by: As  directed    Diet - low sodium heart healthy   Complete by: As directed    Diet Carb Modified   Complete by: As directed    Increase activity slowly   Complete by: As directed       Contact information for follow-up providers    D'Silva, Belva Crome, MD Follow up.   Specialty: Internal Medicine Contact information: [place] [address] [phone]        Phillips Odor, MD Follow up.   Specialty: Neurology Contact information: [address] [place] [address] [phone]            Contact information for after-discharge care    Destination    [facility] .   Service: Skilled Nursing Contact information: [address] [place] [phone]                  Recommendations for Outpatient Follow-Up:   1. Follow-up with PCP as an outpatient 2. Follow-up with neurology as an outpatient  Discharge Instructions:  Follow with Primary MD D'Silva, Belva Crome, MD in 7 days   Get CBC/BMP checked in next visit within 1 week by PCP or SNF MD ( we routinely change or add medications that can affect your baseline labs and fluid status, therefore we recommend that you get the mentioned basic workup next visit with your PCP, your PCP may decide not to get them or add new tests based on their clinical decision)  On your next visit with your PCP, please Get Medicines reviewed and adjusted.  Please request your PCP  to go over all Hospital Tests and Procedure/Radiological results at the follow up, please get all Hospital records sent to your Prim MD by signing hospital release before you go home.  Activity: As tolerated with Full fall precautions use walker/cane & assistance as needed  For Heart failure patients - Check your Weight same time everyday, if you gain over 2 pounds, or you develop in leg swelling, experience more shortness of breath or chest pain, call your Primary MD immediately. Follow Cardiac Low  Salt Diet and 1.5 lit/day fluid restriction.  If you have smoked or chewed Tobacco in the last 2 yrs please stop smoking, stop any regular Alcohol  and or any Recreational drug use.  If you experience worsening of your admission symptoms, develop shortness of breath, life threatening emergency, suicidal or homicidal thoughts you must seek medical attention immediately by calling 911 or calling your MD immediately  if symptoms less severe.  You Must read complete instructions/literature along with all the possible adverse reactions/side effects for all the Medicines you take and that have been prescribed to you. Take any new Medicines after you have completely understood and accpet all the possible adverse reactions/side effects.   Do not drive, operate  heavy machinery, perform activities at heights, swimming or participation in water activities or provide baby sitting services if your were admitted for syncope or siezures until you have seen by Primary MD or a Neurologist and advised to do so again.  Do not drive when taking Pain medications.  Do not take more than prescribed Pain, Sleep and Anxiety Medications  Wear Seat belts while driving.   Please note You were cared for by a hospitalist during your hospital stay. If you have any questions about your discharge medications or the care you received while you were in the hospital after you are discharged, you can call the unit and asked to speak with the hospitalist on call if the hospitalist that took care of you is not available. Once you are discharged, your primary care physician will handle any further medical issues. Please note that NO REFILLS for any discharge medications will be authorized once you are discharged, as it is imperative that you return to your primary care physician (or establish a relationship with a primary care physician if you do not have one) for your aftercare needs so that they can reassess your need for medications and  monitor your lab values.    Allergies as of 11/29/2019      Reactions   Iohexol Shortness Of Breath   Pt stopped breathing at [place]. Pt refuses IV dye   Codeine Itching   Morphine And Related Itching   Nitrofuran Derivatives Other (See Comments)   Unknown   Oxycodone-acetaminophen Itching      Medication List    STOP taking these medications   Calcium 600+D High Potency 600-400 MG-UNIT tablet Generic drug: Calcium Carbonate-Vitamin D Replaced by: calcium-vitamin D 500-200 MG-UNIT tablet   traMADol 50 MG tablet Commonly known as: ULTRAM     TAKE these medications   acetaminophen 325 MG tablet Commonly known as: TYLENOL Take 650 mg by mouth in the morning and at bedtime.   albuterol 108 (90 Base) MCG/ACT inhaler Commonly known as: VENTOLIN HFA Inhale 3 puffs into the lungs every 6 (six) hours as needed for wheezing or shortness of breath.   aspirin 81 MG chewable tablet Chew 1 tablet (81 mg total) by mouth daily. Start taking on: November 30, 2019   atorvastatin 20 MG tablet Commonly known as: LIPITOR Take 20 mg by mouth at bedtime.   calcium-vitamin D 500-200 MG-UNIT tablet Commonly known as: OSCAL WITH D Take 1 tablet by mouth daily with breakfast. Start taking on: November 30, 2019 Replaces: Calcium 600+D High Potency 600-400 MG-UNIT tablet   carboxymethylcellulose 1 % ophthalmic solution Apply 1-4 drops to eye every 2 (two) hours while awake. 3-4 drops in right eye, 1-2 drops in left eye every 2 hours until bedtime.   Docusate Sodium 100 MG capsule Take 100 mg by mouth 2 (two) times daily.   EPINEPHrine 0.3 mg/0.3 mL Soaj injection Commonly known as: EPI-PEN Inject 0.3 mg into the muscle as needed for anaphylaxis.   fluticasone 50 MCG/ACT nasal spray Commonly known as: FLONASE Place 1 spray into both nostrils 2 (two) times daily.   gabapentin 100 MG capsule Commonly known as: NEURONTIN Take 200 mg by mouth at bedtime.    HYDROcodone-acetaminophen 5-325 MG tablet Commonly known as: NORCO/VICODIN Take 1 tablet by mouth every 8 (eight) hours for 5 days.   hydrOXYzine 10 MG tablet Commonly known as: ATARAX/VISTARIL Take 1 tablet (10 mg total) by mouth 2 (two) times daily as needed for itching.  insulin aspart 100 UNIT/ML injection Commonly known as: novoLOG Inject 0-5 Units into the skin at bedtime.   insulin aspart 100 UNIT/ML injection Commonly known as: novoLOG Inject 0-9 Units into the skin 3 (three) times daily with meals.   lactulose 10 GM/15ML solution Commonly known as: CHRONULAC Take 10 g by mouth in the morning.   levothyroxine 88 MCG tablet Commonly known as: SYNTHROID Take 88 mcg by mouth daily before breakfast. 30 minutes prior to meal or patient will not eat meal   loratadine 10 MG tablet Commonly known as: CLARITIN Take 10 mg by mouth daily.   metoprolol tartrate 25 MG tablet Commonly known as: LOPRESSOR Take 12.5 mg by mouth 2 (two) times daily.   nortriptyline 25 MG capsule Commonly known as: PAMELOR Take 25 mg by mouth at bedtime.   omeprazole 20 MG capsule Commonly known as: PRILOSEC Take 20 mg by mouth 2 (two) times daily.   predniSONE 20 MG tablet Commonly known as: DELTASONE Take 2 tablets (40 mg total) by mouth 2 (two) times daily with a meal.       Time coordinating discharge: 35 minutes  The results of significant diagnostics from this hospitalization (including imaging, microbiology, ancillary and laboratory) are listed below for reference.    Procedures and Diagnostic Studies:   CT Head Wo Contrast  Result Date: 11/22/2019 CLINICAL DATA:  Generalized weakness. EXAM: CT HEAD WITHOUT CONTRAST TECHNIQUE: Contiguous axial images were obtained from the base of the skull through the vertex without intravenous contrast. COMPARISON:  None. FINDINGS: Brain: There is mild cerebral atrophy with widening of the extra-axial spaces and ventricular dilatation. There  are areas of decreased attenuation within the white matter tracts of the supratentorial brain, consistent with microvascular disease changes. Vascular: No hyperdense vessel or unexpected calcification. Skull: Negative for acute fracture or focal lesion. Sinuses/Orbits: Chronic deformities are seen along the medial walls of the bilateral orbits. The paranasal sinuses are otherwise normal in appearance. Other: None. IMPRESSION: 1. Generalized cerebral atrophy. 2. No acute intracranial abnormality. Electronically Signed   By: Virgina Norfolk M.D.   On: 11/22/2019 23:52   CT Cervical Spine Wo Contrast  Result Date: 11/23/2019 CLINICAL DATA:  Initial evaluation for weakness, fall, mid back pain. EXAM: CT CERVICAL, THORACIC, AND LUMBAR SPINE WITHOUT CONTRAST TECHNIQUE: Multidetector CT imaging of the cervical, thoracic and lumbar spine was performed without intravenous contrast. Multiplanar CT image reconstructions were also generated. COMPARISON:  Prior CT from 03/21/2010. FINDINGS: CT CERVICAL SPINE FINDINGS Alignment: Straightening of the normal cervical lordosis. Trace anterolisthesis of C7 on T1, likely chronic and facet mediated. No malalignment. Skull base and vertebrae: Skull base intact. Normal C1-2 articulations are preserved in the dens is intact. Vertebral body heights maintained. No acute fracture. Soft tissues and spinal canal: Soft tissues of the neck demonstrate no acute finding. No abnormal prevertebral edema. Spinal canal within normal limits. Vascular calcifications about the carotid bifurcations. Disc levels: Prominent degenerative changes noted about the C1-2 articulation. Mild cervical spondylosis noted at C4-5 through C6-7 without significant stenosis. Moderate multilevel left-sided facet hypertrophy. Upper chest: Visualized upper chest demonstrates no acute finding. Partially visualized lung apices are clear. Other: None. CT THORACIC SPINE FINDINGS Alignment: Mild scoliosis. Alignment  otherwise normal with preservation of the normal thoracic kyphosis. No listhesis or malalignment. Vertebrae: Compression deformity involving what is favored to be the T5 vertebral body with up to 50% height loss without significant bony retropulsion, chronic in appearance. Vertebral body height otherwise maintained without acute fracture. No  discrete or worrisome osseous lesions. Visualized ribs are grossly intact. Paraspinal and other soft tissues: Paraspinous soft tissues within normal limits. Partially visualized lungs are grossly clear. Aortic atherosclerosis. Disc levels: No significant disc pathology seen within the thoracic spine. No appreciable stenosis. CT LUMBAR SPINE FINDINGS Segmentation: Standard. Alignment: Trace dextroscoliosis. Alignment otherwise normal with preservation of the normal lumbar lordosis. No listhesis or malalignment. Vertebrae: Vertebral body height maintained without acute or chronic fracture. Visualized sacrum and pelvis intact. SI joints approximated symmetric. No worrisome osseous lesions. Paraspinal and other soft tissues: Paraspinous soft tissues demonstrate no acute finding. Aortic atherosclerosis. Diverticulosis noted about the partially visualized sigmoid colon. Visualized visceral structures otherwise unremarkable. Disc levels: L1-2:  Unremarkable. L2-3:  Unremarkable. L3-4: No significant disc bulge. Mild facet hypertrophy. No stenosis. L4-5: Minimal disc bulge. Bilateral facet hypertrophy. No significant stenosis. L5-S1: Degenerative intervertebral disc space narrowing with diffuse disc bulge and reactive endplate change. Moderate facet hypertrophy. No significant spinal stenosis. Mild bilateral L5 foraminal narrowing. IMPRESSION: 1. No acute traumatic injury within the cervical, thoracic, or lumbar spine. 2. 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 3. 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 4.  Aortic Atherosclerosis (ICD10-I70.0). Electronically Signed   By: Jeannine Boga M.D.   On: 11/23/2019 05:49   CT Thoracic Spine Wo Contrast  Result Date: 11/23/2019 CLINICAL DATA:  Initial evaluation for weakness, fall, mid back pain. EXAM: CT CERVICAL, THORACIC, AND LUMBAR SPINE WITHOUT CONTRAST TECHNIQUE: Multidetector CT imaging of the cervical, thoracic and lumbar spine was performed without intravenous contrast. Multiplanar CT image reconstructions were also generated. COMPARISON:  Prior CT from 03/21/2010. FINDINGS: CT CERVICAL SPINE FINDINGS Alignment: Straightening of the normal cervical lordosis. Trace anterolisthesis of C7 on T1, likely chronic and facet mediated. No malalignment. Skull base and vertebrae: Skull base intact. Normal C1-2 articulations are preserved in the dens is intact. Vertebral body heights maintained. No acute fracture. Soft tissues and spinal canal: Soft tissues of the neck demonstrate no acute finding. No abnormal prevertebral edema. Spinal canal within normal limits. Vascular calcifications about the carotid bifurcations. Disc levels: Prominent degenerative changes noted about the C1-2 articulation. Mild cervical spondylosis noted at C4-5 through C6-7 without significant stenosis. Moderate multilevel left-sided facet hypertrophy. Upper chest: Visualized upper chest demonstrates no acute finding. Partially visualized lung apices are clear. Other: None. CT THORACIC SPINE FINDINGS Alignment: Mild scoliosis. Alignment otherwise normal with preservation of the normal thoracic kyphosis. No listhesis or malalignment. Vertebrae: Compression deformity involving what is favored to be the T5 vertebral body with up to 50% height loss without significant bony retropulsion, chronic in appearance. Vertebral body height otherwise maintained without acute fracture. No discrete or worrisome osseous lesions. Visualized ribs are grossly intact. Paraspinal and other  soft tissues: Paraspinous soft tissues within normal limits. Partially visualized lungs are grossly clear. Aortic atherosclerosis. Disc levels: No significant disc pathology seen within the thoracic spine. No appreciable stenosis. CT LUMBAR SPINE FINDINGS Segmentation: Standard. Alignment: Trace dextroscoliosis. Alignment otherwise normal with preservation of the normal lumbar lordosis. No listhesis or malalignment. Vertebrae: Vertebral body height maintained without acute or chronic fracture. Visualized sacrum and pelvis intact. SI joints approximated symmetric. No worrisome osseous lesions. Paraspinal and other soft tissues: Paraspinous soft tissues demonstrate no acute finding. Aortic atherosclerosis. Diverticulosis noted about the partially visualized sigmoid colon. Visualized visceral structures otherwise unremarkable. Disc levels: L1-2:  Unremarkable. L2-3:  Unremarkable. L3-4: No significant disc bulge. Mild facet hypertrophy.  No stenosis. L4-5: Minimal disc bulge. Bilateral facet hypertrophy. No significant stenosis. L5-S1: Degenerative intervertebral disc space narrowing with diffuse disc bulge and reactive endplate change. Moderate facet hypertrophy. No significant spinal stenosis. Mild bilateral L5 foraminal narrowing. IMPRESSION: 1. No acute traumatic injury within the cervical, thoracic, or lumbar spine. 2. 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 3. 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 4.  Aortic Atherosclerosis (ICD10-I70.0). Electronically Signed   By: Jeannine Boga M.D.   On: 11/23/2019 05:49   CT Lumbar Spine Wo Contrast  Result Date: 11/23/2019 CLINICAL DATA:  Initial evaluation for weakness, fall, mid back pain. EXAM: CT CERVICAL, THORACIC, AND LUMBAR SPINE WITHOUT CONTRAST TECHNIQUE: Multidetector CT imaging of the cervical, thoracic and lumbar spine was performed without intravenous  contrast. Multiplanar CT image reconstructions were also generated. COMPARISON:  Prior CT from 03/21/2010. FINDINGS: CT CERVICAL SPINE FINDINGS Alignment: Straightening of the normal cervical lordosis. Trace anterolisthesis of C7 on T1, likely chronic and facet mediated. No malalignment. Skull base and vertebrae: Skull base intact. Normal C1-2 articulations are preserved in the dens is intact. Vertebral body heights maintained. No acute fracture. Soft tissues and spinal canal: Soft tissues of the neck demonstrate no acute finding. No abnormal prevertebral edema. Spinal canal within normal limits. Vascular calcifications about the carotid bifurcations. Disc levels: Prominent degenerative changes noted about the C1-2 articulation. Mild cervical spondylosis noted at C4-5 through C6-7 without significant stenosis. Moderate multilevel left-sided facet hypertrophy. Upper chest: Visualized upper chest demonstrates no acute finding. Partially visualized lung apices are clear. Other: None. CT THORACIC SPINE FINDINGS Alignment: Mild scoliosis. Alignment otherwise normal with preservation of the normal thoracic kyphosis. No listhesis or malalignment. Vertebrae: Compression deformity involving what is favored to be the T5 vertebral body with up to 50% height loss without significant bony retropulsion, chronic in appearance. Vertebral body height otherwise maintained without acute fracture. No discrete or worrisome osseous lesions. Visualized ribs are grossly intact. Paraspinal and other soft tissues: Paraspinous soft tissues within normal limits. Partially visualized lungs are grossly clear. Aortic atherosclerosis. Disc levels: No significant disc pathology seen within the thoracic spine. No appreciable stenosis. CT LUMBAR SPINE FINDINGS Segmentation: Standard. Alignment: Trace dextroscoliosis. Alignment otherwise normal with preservation of the normal lumbar lordosis. No listhesis or malalignment. Vertebrae: Vertebral body  height maintained without acute or chronic fracture. Visualized sacrum and pelvis intact. SI joints approximated symmetric. No worrisome osseous lesions. Paraspinal and other soft tissues: Paraspinous soft tissues demonstrate no acute finding. Aortic atherosclerosis. Diverticulosis noted about the partially visualized sigmoid colon. Visualized visceral structures otherwise unremarkable. Disc levels: L1-2:  Unremarkable. L2-3:  Unremarkable. L3-4: No significant disc bulge. Mild facet hypertrophy. No stenosis. L4-5: Minimal disc bulge. Bilateral facet hypertrophy. No significant stenosis. L5-S1: Degenerative intervertebral disc space narrowing with diffuse disc bulge and reactive endplate change. Moderate facet hypertrophy. No significant spinal stenosis. Mild bilateral L5 foraminal narrowing. IMPRESSION: 1. No acute traumatic injury within the cervical, thoracic, or lumbar spine. 2. 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 3. 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 4.  Aortic Atherosclerosis (ICD10-I70.0). Electronically Signed   By: Jeannine Boga M.D.   On: 11/23/2019 05:49   US Carotid Bilateral (at [facility] and [facility] only)  Result Date: 11/23/2019 CLINICAL DATA:  Generalized weakness, shakiness and blurred vision. EXAM: BILATERAL CAROTID DUPLEX ULTRASOUND TECHNIQUE: Pearline Cables scale imaging, color Doppler and  duplex ultrasound were performed of bilateral carotid and vertebral arteries in the neck. COMPARISON:  None. FINDINGS: Criteria: Quantification of carotid stenosis is based on velocity parameters that correlate the residual internal carotid diameter with NASCET-based stenosis levels, using the diameter of the distal internal carotid lumen as the denominator for stenosis measurement. The following velocity measurements were obtained: RIGHT ICA:  122/31 cm/sec CCA:  75/10 cm/sec SYSTOLIC ICA/CCA RATIO:  1.3 ECA:  91  cm/sec LEFT ICA:  119/34 cm/sec CCA:  25/8 cm/sec SYSTOLIC ICA/CCA RATIO:  1.9 ECA:  92 cm/sec RIGHT CAROTID ARTERY: There is a mild amount of predominately calcified plaque at right carotid bulb and proximal right ICA. Estimated right ICA stenosis is less than 50%. RIGHT VERTEBRAL ARTERY: Antegrade flow with normal waveform and velocity. LEFT CAROTID ARTERY: Mild amount of partially calcified plaque is present at the level of the carotid bulb. No significant left ICA plaque or evidence of left ICA stenosis. LEFT VERTEBRAL ARTERY: Antegrade flow with normal waveform and velocity. IMPRESSION: Mild amount of plaque at the level of both carotid bulbs and the proximal right ICA. Estimated right ICA stenosis is less than 50%. No evidence of left ICA stenosis in the neck. Electronically Signed   By: Aletta Edouard M.D.   On: 11/23/2019 11:15   US Venous Img Lower Bilateral (DVT)  Result Date: 11/23/2019 CLINICAL DATA:  Lower extremity edema bilaterally EXAM: BILATERAL LOWER EXTREMITY VENOUS DUPLEX ULTRASOUND TECHNIQUE: Gray-scale sonography with graded compression, as well as color Doppler and duplex ultrasound were performed to evaluate the lower extremity deep venous systems from the level of the common femoral vein and including the common femoral, femoral, profunda femoral, popliteal and calf veins including the posterior tibial, peroneal and gastrocnemius veins when visible. The superficial great saphenous vein was also interrogated. Spectral Doppler was utilized to evaluate flow at rest and with distal augmentation maneuvers in the common femoral, femoral and popliteal veins. COMPARISON:  None. FINDINGS: RIGHT LOWER EXTREMITY Common Femoral Vein: No evidence of thrombus. Normal compressibility, respiratory phasicity and response to augmentation. Saphenofemoral Junction: No evidence of thrombus. Normal compressibility and flow on color Doppler imaging. Profunda Femoral Vein: No evidence of thrombus. Normal  compressibility and flow on color Doppler imaging. Femoral Vein: No evidence of thrombus. Normal compressibility, respiratory phasicity and response to augmentation. Popliteal Vein: No evidence of thrombus. Normal compressibility, respiratory phasicity and response to augmentation. Calf Veins: No evidence of thrombus. Normal compressibility and flow on color Doppler imaging. Superficial Great Saphenous Vein: No evidence of thrombus. Normal compressibility. Venous Reflux:  None. Other Findings:  None. LEFT LOWER EXTREMITY Common Femoral Vein: No evidence of thrombus. Normal compressibility, respiratory phasicity and response to augmentation. Saphenofemoral Junction: No evidence of thrombus. Normal compressibility and flow on color Doppler imaging. Profunda Femoral Vein: No evidence of thrombus. Normal compressibility and flow on color Doppler imaging. Femoral Vein: No evidence of thrombus. Normal compressibility, respiratory phasicity and response to augmentation. Popliteal Vein: No evidence of thrombus. Normal compressibility, respiratory phasicity and response to augmentation. Calf Veins: No evidence of thrombus. Normal compressibility and flow on color Doppler imaging. Superficial Great Saphenous Vein: No evidence of thrombus. Normal compressibility. Venous Reflux:  None. Other Findings:  None. IMPRESSION: No evidence of deep venous thrombosis in either lower extremity. Electronically Signed   By: Lowella Grip III M.D.   On: 11/23/2019 10:55   DG Chest Port 1 View  Result Date: 11/23/2019 CLINICAL DATA:  Generalized weakness. EXAM: PORTABLE CHEST 1 VIEW COMPARISON:  04/01/2014  FINDINGS: Heart size and pulmonary vascularity are normal. Emphysematous changes in the lungs. Peribronchial thickening and streaky perihilar opacities suggesting chronic bronchitis. No focal consolidation. No pleural effusions. No pneumothorax. Mediastinal contours appear intact. Degenerative changes in the spine and shoulders. Old  bilateral rib fractures. 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 No evidence of active pulmonary disease. Electronically Signed   By: Lucienne Capers M.D.   On: 11/23/2019 00:17   ECHOCARDIOGRAM COMPLETE  Result Date: 11/23/2019    ECHOCARDIOGRAM REPORT   Patient Name:   Christina Fry [facility] Date of Exam: 11/23/2019 Medical Rec #:  [id number]    Height:       63.0 in Accession #:    [id number]   Weight:       161.0 lb Date of Birth:  11/12/1940     BSA:          1.763 m Patient Age:    29 years     BP:           158/70 mmHg Patient Gender: F            HR:           73 bpm. Exam Location:  [facility] Procedure: 2D Echo Indications:    TIA G45.9  History:        Patient has no prior history of Echocardiogram examinations.                 Risk Factors:Dyslipidemia and Diabetes.  Sonographer:    Mikki Santee RDCS (AE) Referring Phys: [id number] [place]  1. Left ventricular ejection fraction, by estimation, is 60 to 65%. The left ventricle has normal function. The left ventricle has no regional wall motion abnormalities. There is severe left ventricular hypertrophy of the basal-septal segment. Left ventricular diastolic parameters are consistent with Grade I diastolic dysfunction (impaired relaxation). Elevated left ventricular end-diastolic pressure.  2. Right ventricular systolic function is mildly reduced. The right ventricular size is normal. Tricuspid regurgitation signal is inadequate for assessing PA pressure.  3. The mitral valve is normal in structure. Trivial mitral valve regurgitation. No evidence of mitral stenosis.  4. The aortic valve is tricuspid. Aortic valve regurgitation is trivial. Mild to moderate aortic valve sclerosis/calcification is present, without any evidence of aortic stenosis.  5. The inferior vena cava is normal in size with greater than 50% respiratory variability, suggesting right atrial pressure of 3 mmHg. FINDINGS  Left Ventricle: Left  ventricular ejection fraction, by estimation, is 60 to 65%. The left ventricle has normal function. The left ventricle has no regional wall motion abnormalities. The left ventricular internal cavity size was normal in size. There is  severe left ventricular hypertrophy of the basal-septal segment. Left ventricular diastolic parameters are consistent with Grade I diastolic dysfunction (impaired relaxation). Elevated left ventricular end-diastolic pressure. Right Ventricle: The right ventricular size is normal. No increase in right ventricular wall thickness. Right ventricular systolic function is mildly reduced. Tricuspid regurgitation signal is inadequate for assessing PA pressure. Left Atrium: Left atrial size was normal in size. Right Atrium: Right atrial size was normal in size. Pericardium: There is no evidence of pericardial effusion. Mitral Valve: The mitral valve is normal in structure. Trivial mitral valve regurgitation. No evidence of mitral valve stenosis. Tricuspid Valve: The tricuspid valve is normal in structure. Tricuspid valve regurgitation is trivial. No evidence of tricuspid stenosis. Aortic Valve: The aortic valve is tricuspid. Aortic valve regurgitation is trivial. Mild to moderate aortic valve  sclerosis/calcification is present, without any evidence of aortic stenosis. Pulmonic Valve: The pulmonic valve was normal in structure. Pulmonic valve regurgitation is not visualized. No evidence of pulmonic stenosis. Aorta: The aortic root is normal in size and structure. Venous: The inferior vena cava is normal in size with greater than 50% respiratory variability, suggesting right atrial pressure of 3 mmHg. IAS/Shunts: No atrial level shunt detected by color flow Doppler.  LEFT VENTRICLE PLAX 2D LVIDd:         3.66 cm  Diastology LVIDs:         2.48 cm  LV e' medial:    5.45 cm/s LV PW:         1.10 cm  LV E/e' medial:  16.7 LV IVS:        1.66 cm  LV e' lateral:   5.00 cm/s LVOT diam:     2.10 cm  LV  E/e' lateral: 18.2 LV SV:         87 LV SV Index:   49 LVOT Area:     3.46 cm  RIGHT VENTRICLE RV S prime:     8.49 cm/s TAPSE (M-mode): 1.5 cm LEFT ATRIUM             Index       RIGHT ATRIUM           Index LA diam:        3.70 cm 2.10 cm/m  RA Area:     10.20 cm LA Vol (A2C):   58.9 ml 33.40 ml/m RA Volume:   21.90 ml  12.42 ml/m LA Vol (A4C):   38.2 ml 21.66 ml/m LA Biplane Vol: 48.8 ml 27.67 ml/m  AORTIC VALVE LVOT Vmax:   96.70 cm/s LVOT Vmean:  67.900 cm/s LVOT VTI:    0.252 m  AORTA Ao Root diam: 2.60 cm MITRAL VALVE MV Area (PHT): 3.08 cm     SHUNTS MV Decel Time: 246 msec     Systemic VTI:  0.25 m MV E velocity: 91.05 cm/s   Systemic Diam: 2.10 cm MV A velocity: 101.15 cm/s MV E/A ratio:  0.90 Fransico Him MD Electronically signed by Fransico Him MD Signature Date/Time: 11/23/2019/4:17:40 PM    Final      Labs:   Basic Metabolic Panel: Recent Labs  Lab 11/22/19 2020 11/22/19 2020 11/23/19 0514 11/23/19 0514 11/24/19 0813 11/29/19 0802  NA 137  --  141  --  134* 138  K 3.8   < > 3.5   < > 3.9 3.7  CL 101  --  105  --  102 102  CO2 26  --  26  --  26 28  GLUCOSE 109*  --  100*  --  92 139*  BUN 14  --  12  --  11 21  CREATININE 1.09*  --  0.97  --  0.97 1.01*  CALCIUM 9.6  --  9.1  --  8.9 8.5*  MG  --   --  1.8  --   --   --   PHOS  --   --  3.1  --   --   --    < > = values in this interval not displayed.   GFR Estimated Creatinine Clearance: 41.8 mL/min (A) (by C-G formula based on SCr of 1.01 mg/dL (H)). Liver Function Tests: Recent Labs  Lab 11/22/19 2349 11/23/19 0514  AST 16 14*  ALT 10 8  ALKPHOS 78 75  BILITOT 0.6  0.5  PROT 6.9 6.4*  ALBUMIN 3.8 3.5   No results for input(s): LIPASE, AMYLASE in the last 168 hours. Recent Labs  Lab 11/22/19 2350  AMMONIA 12   Coagulation profile Recent Labs  Lab 11/23/19 0514  INR 1.0    CBC: Recent Labs  Lab 11/22/19 2020 11/23/19 0514 11/24/19 0813 11/29/19 0802  WBC 10.4 9.1 7.8 5.8  NEUTROABS   --   --  5.0 4.5  HGB 13.0 11.9* 10.6* 11.4*  HCT 41.1 38.3 34.4* 36.1  MCV 97.4 96.7 96.6 95.5  PLT 348 315 301 309   Cardiac Enzymes: Recent Labs  Lab 11/22/19 2349  CKTOTAL 40   BNP: Invalid input(s): POCBNP CBG: Recent Labs  Lab 11/27/19 2214 11/28/19 1622 11/28/19 2116 11/29/19 0727 11/29/19 1131  GLUCAP 131* 224* 216* 132* 131*   D-Dimer No results for input(s): DDIMER in the last 72 hours. Hgb A1c No results for input(s): HGBA1C in the last 72 hours. Lipid Profile No results for input(s): CHOL, HDL, LDLCALC, TRIG, CHOLHDL, LDLDIRECT in the last 72 hours. Thyroid function studies No results for input(s): TSH, T4TOTAL, T3FREE, THYROIDAB in the last 72 hours.  Invalid input(s): FREET3 Anemia work up No results for input(s): VITAMINB12, FOLATE, FERRITIN, TIBC, IRON, RETICCTPCT in the last 72 hours. Microbiology Recent Results (from the past 240 hour(s))  Urine culture     Status: Abnormal   Collection Time: 11/22/19  2:26 PM   Specimen: Urine, Random  Result Value Ref Range Status   Specimen Description   Final    URINE, RANDOM Performed at [facility], [address] [place], [place] [address]    Special Requests   Final    NONE Performed at [facility], [address] [place], [place] [address]    Culture (A)  Final    <10,000 COLONIES/mL INSIGNIFICANT GROWTH Performed at [facility] [address], [place],  [address]    Report Status 11/24/2019 FINAL  Final  SARS Coronavirus 2 by RT PCR (hospital order, performed in [facility] hospital lab) Nasopharyngeal Nasopharyngeal Swab     Status: None   Collection Time: 11/22/19 11:52 PM   Specimen: Nasopharyngeal Swab  Result Value Ref Range Status   SARS Coronavirus 2 NEGATIVE NEGATIVE Final    Comment: (NOTE) SARS-CoV-2 target nucleic acids are NOT DETECTED.  The SARS-CoV-2 RNA is generally detectable in upper and lower respiratory specimens during the acute phase of infection.  The lowest concentration of SARS-CoV-2 viral copies this assay can detect is 250 copies / mL. A negative result does not preclude SARS-CoV-2 infection and should not be used as the sole basis for treatment or other patient management decisions.  A negative result may occur with improper specimen collection / handling, submission of specimen other than nasopharyngeal swab, presence of viral mutation(s) within the areas targeted by this assay, and inadequate number of viral copies (<250 copies / mL). A negative result must be combined with clinical observations, patient history, and epidemiological information.  Fact Sheet for Patients:   StrictlyIdeas.no  Fact Sheet for Healthcare Providers: BankingDealers.co.za  This test is not yet approved or  cleared by the [place] FDA and has been authorized for detection and/or diagnosis of SARS-CoV-2 by FDA under an Emergency Use Authorization (EUA).  This EUA will remain in effect (meaning this test can be used) for the duration of the COVID-19 declaration under Section 564(b)(1) of the Act, 21 U.S.C. section 360bbb-3(b)(1), unless the authorization is  terminated or revoked sooner.  Performed at [facility], [address], [place], [place] [address]      Signed: Terrilee Croak  [facility] 11/29/2019, 11:43 AM

## 2019-11-30 LAB — CREATININE, SERUM
Creatinine, Ser: 1.03 mg/dL — ABNORMAL HIGH (ref 0.44–1.00)
GFR calc Af Amer: 59 mL/min — ABNORMAL LOW (ref >60)
GFR calc non Af Amer: 51 mL/min — ABNORMAL LOW (ref >60)

## 2019-11-30 LAB — GLUCOSE, CAPILLARY
Glucose-Capillary: 83 mg/dL (ref 70–99)
Glucose-Capillary: 111 mg/dL — ABNORMAL HIGH (ref 70–99)
Glucose-Capillary: 190 mg/dL — ABNORMAL HIGH (ref 70–99)
Glucose-Capillary: 209 mg/dL — ABNORMAL HIGH (ref 70–99)

## 2019-11-30 MED ORDER — PANTOPRAZOLE SODIUM 40 MG PO TBEC
40.0000 mg | DELAYED_RELEASE_TABLET | Freq: Every day | ORAL | Status: DC
  Administered 2019-12-01 – 2019-12-10 (×10): 40 mg via ORAL
  Filled 2019-11-30 (×10): qty 1

## 2019-11-30 NOTE — Progress Notes (Signed)
PROGRESS NOTE  Christina Fry JQB:[id number] DOB: [birth year] DOA: 11/22/2019 PCP: Ferdie Ping, MD  Brief History   Christina Fry is a 79 y.o. female with PMH of polymyalgia rheumatica, fibromyalgia, GERD, hypertension, hypothyroidism. Patient presented to the ED on 11/22/2019 with progressively worsening generalized weakness, shakiness and multiple tender points in the body for last few days culminating to a fall on 9/10. At baseline, patient ambulates with a walker but she has been avoiding ambulating last few days in fear of falling.  She also complains of worsening pain and swelling of both lower extremities and also complains of body pain and back pain even on coughing.  Since the night before, patient also has blurring of vision on the left eye. She has a history of polymyalgia rheumatica with an acute flare of 10 years ago, ended up in an LTAC for intensive rehab.  She improved with prednisone and had to take it for 3 to 4 years and gradually tapered off it. [facility]  In the ED, patient was hemodynamically stable. Labs unremarkable.   CT scan of head, cervical spine, thoracic spine, lumbar spine did not show any acute abnormality.  Chronic T5 compression fracture was noted.   Chest x-ray showed emphysematous and chronic bronchitis changes in the lungs with no evidence of active pulmonary disease.  Patient was admitted under hospitalist service for further evaluation management  Consultants  . Neurology  Procedures  . None  Antibiotics   Anti-infectives (From admission, onward)   None    .  Subjective  The patient is resting comfortably. She is complaining of pain in her legs bilaterally.  Objective   Vitals:  Vitals:   11/30/19 0558 11/30/19 1517  BP: (!) 147/66 (!) 139/57  Pulse: 62 69  Resp: 16 19  Temp: 97.7 F (36.5 C) 98.5 F (36.9 C)  SpO2: 95% 97%    Exam:  Constitutional:  The patient is awake, alert, and oriented x 3.Mild distress from  leg pain. Respiratory:  . No increased work of breathing. . No wheezes, rales, or rhonchi . No tactile fremitus Cardiovascular:  . Regular rate and rhythm . No murmurs, ectopy, or gallups. . No lateral PMI. No thrills. Abdomen:  . Abdomen is soft, non-tender, non-distended . No hernias, masses, or organomegaly . Normoactive bowel sounds.  Musculoskeletal:  . No cyanosis, clubbing, or edema Skin:  . No rashes, lesions, ulcers . palpation of skin: no induration or nodules Neurologic:  . CN 2-12 intact . Sensation all 4 extremities intact Psychiatric:  . Mental status o Mood, affect appropriate o Orientation to person, place, time  . judgment and insight appear intact  I have personally reviewed the following:   Today's Data  . Vitals, Creatinine  Scheduled Meds: .  stroke: mapping our early stages of recovery book   Does not apply Once  . aspirin  81 mg Oral Daily  . atorvastatin  20 mg Oral QHS  . calcium-vitamin D  1 tablet Oral Q breakfast  . cholecalciferol  400 Units Oral Daily  . docusate sodium  100 mg Oral BID  . enoxaparin (LOVENOX) injection  40 mg Subcutaneous Q24H  . fluticasone  1 spray Each Nare BID  . gabapentin  300 mg Oral TID  . HYDROcodone-acetaminophen  1 tablet Oral Q8H  . insulin aspart  0-5 Units Subcutaneous QHS  . insulin aspart  0-9 Units Subcutaneous TID WC  . lactulose  10 g Oral q AM  .  levothyroxine  88 mcg Oral QAC breakfast  . loratadine  10 mg Oral Daily  . metoprolol tartrate  25 mg Oral BID  . nortriptyline  25 mg Oral QHS  . [START ON 12/01/2019] pantoprazole  40 mg Oral Daily   Continuous Infusions: . methylPREDNISolone (SOLU-MEDROL) injection 500 mg (11/30/19 1024)    Principal Problem:   Weakness of both lower extremities Active Problems:   Generalized weakness   PMR (polymyalgia rheumatica) (HCC)   GERD (gastroesophageal reflux disease)   Fibromyalgia   Essential hypertension   Hypothyroidism   Ambulatory  dysfunction   Accident due to mechanical fall without injury   TIA (transient ischemic attack)   LOS: 7 days   A & P  Acute flareup of polymyalgia rheumatica: Presented with few days of progressively worsening bilateral lower extremities pain, generalized weakness, shakiness blurring of vision and a fall.  Able to walk sideways but not forwards. She has a history of polymyalgia rheumatica with chronic bilateral lower extremity pain.  She last had a flare up of 10 years ago, ended up in an LTAC for intensive rehab.  She improved with prednisone and had to take it for 3 to 4 years and gradually tapered off it. Neurology consultation obtained.  Patient needs currently on high-dose of Solu-Medrol 500 mg IV daily. Will wean as possible. For pain control, she is scheduled Norco 5 mg every 8 hours. We will change it to as needed once pain controlled. Continue increased dose of gabapentin 300 mg 3 times daily. Continue to monitor mental status.  Stroke ruled out: Normal MRI brain. Continue primary prophylaxis with patient is on aspirin 81 mg daily and statin.  Potential need of long-term steroids: According to patient's daughter, patient was on prednisone for about 3 years after her last flareup.  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 There is chance patient will be reinitiated on long course of steroids at this time as well. Currently I have her on Protonix and sliding scale insulin with Accu-Cheks.  Bilateral lower extremity swelling and pain: Suspect secondary to PMR.  Ultrasound duplex of lower extremities ruled out DVT.  Fibromyalgia: Continue gabapentin and nortriptyline.  Essential hypertension: Continue Lopressor per home regimen  Hypothyroidism: Continue Synthroid per home regimen. Will recheck TSH T3, and T4.  GERD: Continue Protonix  I have seen and examined this patient myself. I have spent 32  minutes in her evaluation and care.  Code Status:  Code Status: Full Code  DVT prophylaxis: enoxaparin (LOVENOX) injection 40 mg Start: 11/23/19 1000 SCDs Start: 11/23/19 0323 Family Communication:None available Status is: Inpatient  Remains inpatient appropriate because currently remains on high-dose IV steroids continues to have significant amount of pain and weakness.  Dispo: The patient is from: Home  Anticipated d/c is to: SNF  Anticipated d/c date is: 3 days  Patient currently is not medically stable to d/c.    Aritza Brunet, DO [facility] Direct contact: see [email or web address]  7PM-7AM contact night coverage as above 11/30/2019, 5:53 PM  LOS: 7 days

## 2019-11-30 NOTE — Plan of Care (Signed)
  Problem: Education: Goal: Knowledge of secondary prevention will improve Outcome: Progressing   Problem: Education: Goal: Knowledge of General Education information will improve Description: Including pain rating scale, medication(s)/side effects and non-pharmacologic comfort measures Outcome: Progressing   Problem: Health Behavior/Discharge Planning: Goal: Ability to manage health-related needs will improve Outcome: Progressing   Problem: Clinical Measurements: Goal: Ability to maintain clinical measurements within normal limits will improve Outcome: Progressing Goal: Will remain free from infection Outcome: Progressing Goal: Diagnostic test results will improve Outcome: Progressing Goal: Respiratory complications will improve Outcome: Progressing Goal: Cardiovascular complication will be avoided Outcome: Progressing   Problem: Activity: Goal: Risk for activity intolerance will decrease Outcome: Progressing   Problem: Nutrition: Goal: Adequate nutrition will be maintained Outcome: Progressing   Problem: Coping: Goal: Level of anxiety will decrease Outcome: Progressing   Problem: Elimination: Goal: Will not experience complications related to bowel motility Outcome: Progressing Goal: Will not experience complications related to urinary retention Outcome: Progressing   Problem: Pain Managment: Goal: General experience of comfort will improve Outcome: Progressing   Problem: Safety: Goal: Ability to remain free from injury will improve Outcome: Progressing   Problem: Skin Integrity: Goal: Risk for impaired skin integrity will decrease Outcome: Progressing

## 2019-12-01 LAB — GLUCOSE, CAPILLARY
Glucose-Capillary: 81 mg/dL (ref 70–99)
Glucose-Capillary: 137 mg/dL — ABNORMAL HIGH (ref 70–99)
Glucose-Capillary: 133 mg/dL — ABNORMAL HIGH (ref 70–99)
Glucose-Capillary: 132 mg/dL — ABNORMAL HIGH (ref 70–99)

## 2019-12-01 MED ORDER — PREDNISONE 20 MG PO TABS
40.0000 mg | ORAL_TABLET | Freq: Two times a day (BID) | ORAL | Status: DC
  Administered 2019-12-01 – 2019-12-10 (×18): 40 mg via ORAL
  Filled 2019-12-01 (×19): qty 2

## 2019-12-01 NOTE — Progress Notes (Addendum)
PROGRESS NOTE  Christina Fry [name] DOB: 1940/07/07 DOA: 11/22/2019 PCP: Christina Ping, MD  Brief History   Christina Fry is a 79 y.o. female with PMH of polymyalgia rheumatica, fibromyalgia, GERD, hypertension, hypothyroidism. Patient presented to the ED on 11/22/2019 with progressively worsening generalized weakness, shakiness and multiple tender points in the body for last few days culminating to a fall on 9/10. At baseline, patient ambulates with a walker but she has been avoiding ambulating last few days in fear of falling.  She also complains of worsening pain and swelling of both lower extremities and also complains of body pain and back pain even on coughing.  Since the night before, patient also has blurring of vision on the left eye. She has a history of polymyalgia rheumatica with an acute flare of 10 years ago, ended up in an LTAC for intensive rehab.  She improved with prednisone and had to take it for 3 to 4 years and gradually tapered off it. [facility]  In the ED, patient was hemodynamically stable. Labs unremarkable.   CT scan of head, cervical spine, thoracic spine, lumbar spine did not show any acute abnormality.  Chronic T5 compression fracture was noted.   Chest x-ray showed emphysematous and chronic bronchitis changes in the lungs with no evidence of active pulmonary disease.  Patient was admitted under hospitalist service for further evaluation management  Consultants  . Neurology  Procedures  . None  Antibiotics   Anti-infectives (From admission, onward)   None     Subjective  The patient is resting comfortably. No new complaints.  Objective   Vitals:  Vitals:   11/30/19 2116 12/01/19 0533  BP: (!) 145/52 (!) 146/49  Pulse: 72 (!) 58  Resp: 20 20  Temp: 98.4 F (36.9 C) 98.1 F (36.7 C)  SpO2: 96% 97%    Exam:  Constitutional:  The patient is awake, alert, and oriented x 3. No acute distress. Respiratory:  . No increased  work of breathing. . No wheezes, rales, or rhonchi . No tactile fremitus Cardiovascular:  . Regular rate and rhythm . No murmurs, ectopy, or gallups. . No lateral PMI. No thrills. Abdomen:  . Abdomen is soft, non-tender, non-distended . No hernias, masses, or organomegaly . Normoactive bowel sounds.  Musculoskeletal:  . No cyanosis, clubbing, or edema Skin:  . No rashes, lesions, ulcers . palpation of skin: no induration or nodules Neurologic:  . CN 2-12 intact . Sensation all 4 extremities intact Psychiatric:  . Mental status o Mood, affect appropriate o Orientation to person, place, time  . judgment and insight appear intact  I have personally reviewed the following:   Today's Data  . Vitals, Creatinine  Scheduled Meds: .  stroke: mapping our early stages of recovery book   Does not apply Once  . aspirin  81 mg Oral Daily  . atorvastatin  20 mg Oral QHS  . calcium-vitamin D  1 tablet Oral Q breakfast  . cholecalciferol  400 Units Oral Daily  . docusate sodium  100 mg Oral BID  . enoxaparin (LOVENOX) injection  40 mg Subcutaneous Q24H  . fluticasone  1 spray Each Nare BID  . gabapentin  300 mg Oral TID  . HYDROcodone-acetaminophen  1 tablet Oral Q8H  . insulin aspart  0-5 Units Subcutaneous QHS  . insulin aspart  0-9 Units Subcutaneous TID WC  . lactulose  10 g Oral q AM  . levothyroxine  88 mcg Oral QAC  breakfast  . loratadine  10 mg Oral Daily  . metoprolol tartrate  25 mg Oral BID  . nortriptyline  25 mg Oral QHS  . pantoprazole  40 mg Oral Daily   Continuous Infusions: . methylPREDNISolone (SOLU-MEDROL) injection 500 mg (11/30/19 1024)    Principal Problem:   Weakness of both lower extremities Active Problems:   Generalized weakness   PMR (polymyalgia rheumatica) (HCC)   GERD (gastroesophageal reflux disease)   Fibromyalgia   Essential hypertension   Hypothyroidism   Ambulatory dysfunction   Accident due to mechanical fall without injury   TIA  (transient ischemic attack)   LOS: 8 days   A & P  Acute flareup of polymyalgia rheumatica: Presented with few days of progressively worsening bilateral lower extremities pain, generalized weakness, shakiness blurring of vision and a fall.  Able to walk sideways but not forwards. She has a history of polymyalgia rheumatica with chronic bilateral lower extremity pain.  She last had a flare up of 10 years ago, ended up in an LTAC for intensive rehab.  She improved with prednisone and had to take it for 3 to 4 years and gradually tapered off it. Neurology consultation obtained.  Patient needs currently on high-dose of Solu-Medrol 500 mg IV daily. Have reduced steroids to prednisone 40 mg bid as was recommended for discharge dose. For pain control, she is scheduled Norco 5 mg every 8 hours. We will change it to as needed once pain controlled. Continue increased dose of gabapentin 300 mg 3 times daily. Continue to monitor mental status.  Stroke ruled out: Normal MRI brain. Continue primary prophylaxis with patient is on aspirin 81 mg daily and statin.  Potential need of long-term steroids: According to patient's daughter, patient was on prednisone for about 3 years after her last flareup.  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 There is chance patient will be reinitiated on long course of steroids at this time as well. She has been reduced to prednisone 40 mg bid. Currently I have her on Protonix and sliding scale insulin with Accu-Cheks.  Bilateral lower extremity swelling and pain: Suspect secondary to PMR.  Ultrasound duplex of lower extremities ruled out DVT.  Fibromyalgia: Continue gabapentin and nortriptyline.  Essential hypertension: Continue Lopressor per home regimen  Hypothyroidism: Continue Synthroid per home regimen. Will recheck TSH T3, and T4.  GERD: Continue Protonix  I have seen and examined  this patient myself. I have spent 38 minutes in her evaluation and care.  Code Status:  Code Status: Full Code  DVT prophylaxis: enoxaparin (LOVENOX) injection 40 mg Start: 11/23/19 1000 SCDs Start: 11/23/19 0323 Family Communication:None available Status is: Inpatient  Remains inpatient appropriate because currently remains on high-dose IV steroids continues to have significant amount of pain and weakness.  Dispo: The patient is from: Home  Anticipated d/c is to: SNF  Anticipated d/c date is: 2 days  Patient currently is not medically stable to d/c.  Alphonzo Devera, DO [facility] Direct contact: see [email or web address]  7PM-7AM contact night coverage as above 12/01/2019, 1:58 PM  LOS: 7 days

## 2019-12-02 LAB — GLUCOSE, CAPILLARY
Glucose-Capillary: 108 mg/dL — ABNORMAL HIGH (ref 70–99)
Glucose-Capillary: 143 mg/dL — ABNORMAL HIGH (ref 70–99)
Glucose-Capillary: 184 mg/dL — ABNORMAL HIGH (ref 70–99)
Glucose-Capillary: 192 mg/dL — ABNORMAL HIGH (ref 70–99)

## 2019-12-02 LAB — CBC WITH DIFFERENTIAL/PLATELET
Abs Immature Granulocytes: 0.11 10*3/uL — ABNORMAL HIGH (ref 0.00–0.07)
Basophils Absolute: 0 10*3/uL (ref 0.0–0.1)
Basophils Relative: 0 %
Eosinophils Absolute: 0 10*3/uL (ref 0.0–0.5)
Eosinophils Relative: 0 %
HCT: 35 % — ABNORMAL LOW (ref 36.0–46.0)
Hemoglobin: 10.8 g/dL — ABNORMAL LOW (ref 12.0–15.0)
Immature Granulocytes: 1 %
Lymphocytes Relative: 13 %
Lymphs Abs: 1.3 10*3/uL (ref 0.7–4.0)
MCH: 29.8 pg (ref 26.0–34.0)
MCHC: 30.9 g/dL (ref 30.0–36.0)
MCV: 96.7 fL (ref 80.0–100.0)
Monocytes Absolute: 0.5 10*3/uL (ref 0.1–1.0)
Monocytes Relative: 5 %
Neutro Abs: 8.5 10*3/uL — ABNORMAL HIGH (ref 1.7–7.7)
Neutrophils Relative %: 81 %
Platelets: 288 10*3/uL (ref 150–400)
RBC: 3.62 MIL/uL — ABNORMAL LOW (ref 3.87–5.11)
RDW: 14.3 % (ref 11.5–15.5)
WBC: 10.4 10*3/uL (ref 4.0–10.5)
nRBC: 0 % (ref 0.0–0.2)

## 2019-12-02 LAB — COMPREHENSIVE METABOLIC PANEL
ALT: 14 U/L (ref 0–44)
AST: 9 U/L — ABNORMAL LOW (ref 15–41)
Albumin: 2.7 g/dL — ABNORMAL LOW (ref 3.5–5.0)
Alkaline Phosphatase: 51 U/L (ref 38–126)
Anion gap: 7 (ref 5–15)
BUN: 22 mg/dL (ref 8–23)
CO2: 29 mmol/L (ref 22–32)
Calcium: 8.3 mg/dL — ABNORMAL LOW (ref 8.9–10.3)
Chloride: 104 mmol/L (ref 98–111)
Creatinine, Ser: 1.19 mg/dL — ABNORMAL HIGH (ref 0.44–1.00)
GFR calc Af Amer: 50 mL/min — ABNORMAL LOW (ref >60)
GFR calc non Af Amer: 43 mL/min — ABNORMAL LOW (ref >60)
Glucose, Bld: 117 mg/dL — ABNORMAL HIGH (ref 70–99)
Potassium: 3.9 mmol/L (ref 3.5–5.1)
Sodium: 140 mmol/L (ref 135–145)
Total Bilirubin: 0.7 mg/dL (ref 0.3–1.2)
Total Protein: 5.1 g/dL — ABNORMAL LOW (ref 6.5–8.1)

## 2019-12-02 MED ORDER — HYDROMORPHONE HCL 2 MG PO TABS
1.0000 mg | ORAL_TABLET | ORAL | Status: AC | PRN
  Administered 2019-12-02: 1 mg via ORAL
  Filled 2019-12-02: qty 1

## 2019-12-02 MED ORDER — HYDROMORPHONE HCL 1 MG/ML PO LIQD: 0.5000 mg | ORAL | Status: DC | PRN

## 2019-12-02 MED ORDER — FENTANYL CITRATE (PF) 100 MCG/2ML IJ SOLN: 12.5000 ug | Freq: Once | INTRAMUSCULAR | Status: DC

## 2019-12-02 NOTE — TOC Progression Note (Addendum)
Transition of Care [facility]) - Progression Note   Patient Details  Name: Christina Fry MRN: [id number] Date of Birth: 1940-07-06  Transition of Care [facility]) CM/SW Wheeler, LCSW Phone Number: 12/02/2019, 12:08 PM  Clinical Narrative: CSW spoke with Christina Fry [phone]) with the [facility] to follow up with the patient being approved for SNF. Per Ms. Christina Fry, [facility] and requested that [facility] fax patient out to SNFs in-network with the [facility]. CSW faxed patient in HUB to [facility] and [place] in [place] called [facility] [phone]) and left voicemail with Ms. Christina Fry, the admissions coordinator. CSW called [facility] and spoke with Christina Fry [phone]) in admissions. CSW faxed clinicals to [facility] [phone]) and [facility] [phone]). CSW left voicemail for Christina Fry with admissions for [facility] [phone]) regarding referral.  Addendum: 1:07pm-CSW received phone call from patient's daughter, Christina Fry. CSW provided update on SNF referrals.  3:30pm-CSW received call from daughter. Daughter reported she spoke with Ms. Christina Fry and the patient can be faxed out to [facility] facilities with the [facility]. CSW faxed referral and PT notes to [facility] in [facility].  Expected Discharge Plan: Skilled Nursing Facility Barriers to Discharge: Insurance Authorization  Expected Discharge Plan and Services Expected Discharge Plan: [place] Choice: [place] arrangements for the past 2 months: Single Family Home Expected Discharge Date: 11/29/19               DME Arranged: N/A DME Agency: NA HH Arranged: NA [place] Agency: NA  Readmission Risk Interventions No flowsheet data found.

## 2019-12-02 NOTE — Progress Notes (Signed)
Pt high fall risk, refusing bed alarm and assistance when ambulating. Educated on importance of fall prevention and pt verbalizes understanding but still refuses methods of prevention. Upon assessment, no IV access established. MD notified. Pt states multiple IV access attempts have been made and is refusing new attempts. Will continue to monitor patient closely and re-attempt to educate pt on importance of measures in state per policies.

## 2019-12-02 NOTE — Progress Notes (Signed)
PROGRESS NOTE  Christina Fry POE:[id number] DOB: February 10, 1941 DOA: 11/22/2019 PCP: Christina Ping, MD  Brief History   Christina Fry is a 79 y.o. female with PMH of polymyalgia rheumatica, fibromyalgia, GERD, hypertension, hypothyroidism. Patient presented to the ED on 11/22/2019 with progressively worsening generalized weakness, shakiness and multiple tender points in the body for last few days culminating to a fall on 9/10. At baseline, patient ambulates with a walker but she has been avoiding ambulating last few days in fear of falling.  She also complains of worsening pain and swelling of both lower extremities and also complains of body pain and back pain even on coughing.  Since the night before, patient also has blurring of vision on the left eye. She has a history of polymyalgia rheumatica with an acute flare of 10 years ago, ended up in an LTAC for intensive rehab.  She improved with prednisone and had to take it for 3 to 4 years and gradually tapered off it. [facility]  In the ED, patient was hemodynamically stable. Labs unremarkable.   CT scan of head, cervical spine, thoracic spine, lumbar spine did not show any acute abnormality.  Chronic T5 compression fracture was noted.   Chest x-ray showed emphysematous and chronic bronchitis changes in the lungs with no evidence of active pulmonary disease.  Patient was admitted under hospitalist service for further evaluation management  Consultants  . Neurology  Procedures  . None  Antibiotics   Anti-infectives (From admission, onward)   None     Subjective  The patient is resting comfortably. No new complaints.  Objective   Vitals:  Vitals:   12/01/19 2218 12/02/19 0603  BP: (!) 158/67 (!) 142/60  Pulse: 80 64  Resp:  18  Temp: 98 F (36.7 C) 98.1 F (36.7 C)  SpO2: 100% 95%    Exam:  Constitutional:  The patient is awake, alert, and oriented x 3. No acute distress. Respiratory:  . No increased work of  breathing. . No wheezes, rales, or rhonchi . No tactile fremitus Cardiovascular:  . Regular rate and rhythm . No murmurs, ectopy, or gallups. . No lateral PMI. No thrills. Abdomen:  . Abdomen is soft, non-tender, non-distended . No hernias, masses, or organomegaly . Normoactive bowel sounds.  Musculoskeletal:  . No cyanosis, clubbing, or edema Skin:  . No rashes, lesions, ulcers . palpation of skin: no induration or nodules Neurologic:  . CN 2-12 intact . Sensation all 4 extremities intact Psychiatric:  . Mental status o Mood, affect appropriate o Orientation to person, place, time  . judgment and insight appear intact  I have personally reviewed the following:   Today's Data  . Vitals, Creatinine  Scheduled Meds: .  stroke: mapping our early stages of recovery book   Does not apply Once  . aspirin  81 mg Oral Daily  . atorvastatin  20 mg Oral QHS  . calcium-vitamin D  1 tablet Oral Q breakfast  . cholecalciferol  400 Units Oral Daily  . docusate sodium  100 mg Oral BID  . enoxaparin (LOVENOX) injection  40 mg Subcutaneous Q24H  . fluticasone  1 spray Each Nare BID  . gabapentin  300 mg Oral TID  . HYDROcodone-acetaminophen  1 tablet Oral Q8H  . insulin aspart  0-5 Units Subcutaneous QHS  . insulin aspart  0-9 Units Subcutaneous TID WC  . lactulose  10 g Oral q AM  . levothyroxine  88 mcg Oral QAC breakfast  .  loratadine  10 mg Oral Daily  . metoprolol tartrate  25 mg Oral BID  . nortriptyline  25 mg Oral QHS  . pantoprazole  40 mg Oral Daily  . predniSONE  40 mg Oral BID WC   Continuous Infusions:   Principal Problem:   Weakness of both lower extremities Active Problems:   Generalized weakness   PMR (polymyalgia rheumatica) (HCC)   GERD (gastroesophageal reflux disease)   Fibromyalgia   Essential hypertension   Hypothyroidism   Ambulatory dysfunction   Accident due to mechanical fall without injury   TIA (transient ischemic attack)   LOS: 9 days    A & P  Acute flareup of polymyalgia rheumatica: Presented with few days of progressively worsening bilateral lower extremities pain, generalized weakness, shakiness blurring of vision and a fall.  Able to walk sideways but not forwards. She has a history of polymyalgia rheumatica with chronic bilateral lower extremity pain.  She last had a flare up of 10 years ago, ended up in an LTAC for intensive rehab.  She improved with prednisone and had to take it for 3 to 4 years and gradually tapered off it. Neurology consultation obtained.  Patient needs currently on high-dose of Solu-Medrol 500 mg IV daily. Have reduced steroids to prednisone 40 mg bid as was recommended for discharge dose. For pain control, she is scheduled Norco 5 mg every 8 hours. We will change it to as needed once pain controlled. Continue increased dose of gabapentin 300 mg 3 times daily. Continue to monitor mental status.  Stroke ruled out: Normal MRI brain. Continue primary prophylaxis with patient is on aspirin 81 mg daily and statin.  Potential need of long-term steroids: According to patient's daughter, patient was on prednisone for about 3 years after her last flareup.  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 There is chance patient will be reinitiated on long course of steroids at this time as well. She has been reduced to prednisone 40 mg bid. Currently I have her on Protonix and sliding scale insulin with Accu-Cheks.  Bilateral lower extremity swelling and pain: Suspect secondary to PMR.  Ultrasound duplex of lower extremities ruled out DVT.  DM II: FSBS has been 108-143 in the last 24 hours. Continue to follow FSBS and SSI.  Fibromyalgia: Continue gabapentin and nortriptyline.  Essential hypertension: Continue Lopressor per home regimen  Hypothyroidism: Continue Synthroid per home regimen. Will recheck TSH T3, and T4.  GERD:  Continue Protonix  I have seen and examined this patient myself. I have spent 30 minutes in her evaluation and care.  Code Status:  Code Status: Full Code  DVT prophylaxis: enoxaparin (LOVENOX) injection 40 mg Start: 11/23/19 1000 SCDs Start: 11/23/19 0323 Family Communication:None available Status is: Inpatient  Remains inpatient appropriate because currently remains on high-dose IV steroids continues to have significant amount of pain and weakness.  Dispo: The patient is from: Home  Anticipated d/c is to: SNF  Anticipated d/c date is: 2 days  Patient currently is not medically stable to d/c.  Michaelangelo Mittelman, DO [facility] Direct contact: see [email or web address]  7PM-7AM contact night coverage as above 12/02/2019, 3:03 PM  LOS: 7 days

## 2019-12-02 NOTE — Progress Notes (Signed)
Physical Therapy Treatment Patient Details Name: Christina Fry MRN: [id number] DOB: Apr 27, 1940 Today's Date: 12/02/2019    History of Present Illness 79 y.o. female with medical history significant for PMR, fibromyalgia, GERD, hypertension, hypothyroidism who presents to the emergency department due to generalized weakness and fall sustained at home.  She complained of 2-day onset of sudden increase in shaking episodes, this was associated with generalized tender points in her back and lower extremities.  Patient ambulates with a walker at baseline, and she complained of difficulty in being able to ambulate within last 2 days, an attempt to ambulate resulted in her fall due to difficulty in being able to lift her feet from the ground due to weakness.  She went to an urgent care, but since the result for blood work would not be available for 2-4 days, [facility], but decided to go to ED for further evaluation due to the fall.Patient endorsed recent severe coughing fit with resultant increased back pain, however, this would not explain the shakes in her arms and legs.  She states that she had similar presentation several years ago and ended up being in [place] for intensive rehab, prior to being able to regain ability to ambulate.  Patient did complain of blurry vision last night when she was going to text her daughter and she ended up dictating her text.    PT Comments    Pt declines to get out of bed due to pain in low back and was up earlier today. Pt agreeable to exercises in bed. Pt able to perform toe flexion/extension for all 10 toes with good motor control, in decreased ROM due to performing in pain free range. Pt tolerates passive ankle pumps to neutral bilaterally. Pt attempted quad sets but abd/add compensations noted bilaterally. Pt with L quad "cramp" during quad set attempt requiring rest break. Pt able to actively perform hip abd/add without increasing pain.  Session limited due to pt wanting to remaining bed, but reports she is active around the room throughout the day and calls nursing when needing assist. Patient will benefit from continued physical therapy in hospital and recommendations below to increase strength, balance, endurance for safe ADLs and gait.    Follow Up Recommendations  SNF     Equipment Recommendations  None recommended by PT    Recommendations for Other Services       Precautions / Restrictions Precautions Precautions: Fall Precaution Comments: high pain Restrictions Weight Bearing Restrictions: No    Mobility  Bed Mobility  General bed mobility comments: pt declines  Transfers  General transfer comment: pt declines  Ambulation/Gait      Stairs             Wheelchair Mobility    Modified Rankin (Stroke Patients Only)       Balance         Cognition Arousal/Alertness: Awake/alert Behavior During Therapy: WFL for tasks assessed/performed Overall Cognitive Status: Within Functional Limits for tasks assessed       Exercises General Exercises - Lower Extremity Ankle Circles/Pumps: PROM;Both;10 reps;Supine Quad Sets:  (attempted, unable bilaterally) Hip ABduction/ADduction: AROM;Both;5 reps Other Exercises Other Exercises: toe flexion/extension, 20 reps, ~50% AROM, pain-free    General Comments        Pertinent Vitals/Pain Pain Assessment: 0-10 Faces Pain Scale: Hurts little more Pain Location: low back Pain Descriptors / Indicators: Aching;Sore;Shooting (lightening) Pain Intervention(s): Limited activity within patient's tolerance;Monitored during session    Home  Living             Prior Function            PT Goals (current goals can now be found in the care plan section) Acute Rehab PT Goals Patient Stated Goal: return home after rehab PT Goal Formulation: With patient Time For Goal Achievement: 12/07/19 Potential to Achieve Goals: Good Progress towards PT goals:  Progressing toward goals    Frequency    Min 3X/week      PT Plan Current plan remains appropriate    Co-evaluation              AM-PAC PT "6 Clicks" Mobility   Outcome Measure  Help needed turning from your back to your side while in a flat bed without using bedrails?: A Little Help needed moving from lying on your back to sitting on the side of a flat bed without using bedrails?: A Little Help needed moving to and from a bed to a chair (including a wheelchair)?: A Lot Help needed standing up from a chair using your arms (e.g., wheelchair or bedside chair)?: A Lot Help needed to walk in hospital room?: A Lot Help needed climbing 3-5 steps with a railing? : Total 6 Click Score: 13    End of Session   Activity Tolerance: Patient limited by fatigue;Patient limited by pain Patient left: in bed;with call bell/phone within reach   PT Visit Diagnosis: Unsteadiness on feet (R26.81);Other abnormalities of gait and mobility (R26.89);Muscle weakness (generalized) (M62.81)     Time: 7322-0254 PT Time Calculation (min) (ACUTE ONLY): 12 min  Charges:  $Therapeutic Exercise: 8-22 mins                     Tori Brookie Wayment PT, DPT 12/02/19, 2:24 PM [phone]

## 2019-12-03 DIAGNOSIS — M353 Polymyalgia rheumatica: Secondary | ICD-10-CM | POA: Diagnosis not present

## 2019-12-03 LAB — SARS CORONAVIRUS 2 BY RT PCR (HOSPITAL ORDER, PERFORMED IN ~~LOC~~ HOSPITAL LAB): SARS Coronavirus 2: NEGATIVE

## 2019-12-03 LAB — GLUCOSE, CAPILLARY
Glucose-Capillary: 114 mg/dL — ABNORMAL HIGH (ref 70–99)
Glucose-Capillary: 103 mg/dL — ABNORMAL HIGH (ref 70–99)
Glucose-Capillary: 154 mg/dL — ABNORMAL HIGH (ref 70–99)
Glucose-Capillary: 219 mg/dL — ABNORMAL HIGH (ref 70–99)

## 2019-12-03 MED ORDER — FENTANYL 12 MCG/HR TD PT72
1.0000 | MEDICATED_PATCH | TRANSDERMAL | 0 refills | Status: DC
Start: 2019-12-03 — End: 2019-12-10

## 2019-12-03 MED ORDER — INFLUENZA VAC A&B SA ADJ QUAD 0.5 ML IM PRSY
0.5000 mL | PREFILLED_SYRINGE | INTRAMUSCULAR | Status: AC
  Administered 2019-12-03: 0.5 mL via INTRAMUSCULAR
  Filled 2019-12-03: qty 0.5

## 2019-12-03 MED ORDER — FENTANYL 12 MCG/HR TD PT72
1.0000 | MEDICATED_PATCH | TRANSDERMAL | Status: DC
  Administered 2019-12-03 – 2019-12-09 (×2): 1 via TRANSDERMAL
  Filled 2019-12-03 (×3): qty 1

## 2019-12-03 NOTE — Progress Notes (Signed)
PROGRESS NOTE  Christina Fry YOV:[id number] DOB: 1940-11-17 DOA: 11/22/2019 PCP: Ferdie Ping, MD  Brief History   Christina Fry is a 79 y.o. female with PMH of polymyalgia rheumatica, fibromyalgia, GERD, hypertension, hypothyroidism. Patient presented to the ED on 11/22/2019 with progressively worsening generalized weakness, shakiness and multiple tender points in the body for last few days culminating to a fall on 9/10. At baseline, patient ambulates with a walker but she has been avoiding ambulating last few days in fear of falling.  She also complains of worsening pain and swelling of both lower extremities and also complains of body pain and back pain even on coughing.  Since the night before, patient also has blurring of vision on the left eye.She has a history of polymyalgia rheumatica with an acute flare of 10 years ago, ended up in an LTAC for intensive rehab.  She improved with prednisone and had to take it for 3 to 4 years and gradually tapered off it. [facility]  In the ED, patient was hemodynamically stable. Labs unremarkable. CT scan of head, cervical spine, thoracic spine, lumbar spine did not show any acute abnormality.  Chronic T5 compression fracture was noted. Chest x-ray showed emphysematous and chronic bronchitis changes in the lungs with no evidence of active pulmonary disease. Patient was admitted under hospitalist service for further evaluation management. She has received high dose steroids and pain control. She has improved somewhat although mobility and pain continue to be issues.  She is awaiting SNF placement.  Consultants  . Neurology  Procedures  . None  Antibiotics   Anti-infectives (From admission, onward)   None     Subjective  The patient is resting comfortably. No new complaints.  Objective   Vitals:  Vitals:   12/03/19 0523 12/03/19 0919  BP: (!) 147/63 (!) 132/54  Pulse: (!) 55 71  Resp: 14 16  Temp: 98.1 F (36.7 C) 98.9 F  (37.2 C)  SpO2: 97% 98%    Exam:  Constitutional:  The patient is awake, alert, and oriented x 3. No acute distress. Respiratory:  . No increased work of breathing. . No wheezes, rales, or rhonchi . No tactile fremitus Cardiovascular:  . Regular rate and rhythm . No murmurs, ectopy, or gallups. . No lateral PMI. No thrills. Abdomen:  . Abdomen is soft, non-tender, non-distended . No hernias, masses, or organomegaly . Normoactive bowel sounds.  Musculoskeletal:  . No cyanosis, clubbing, or edema Skin:  . No rashes, lesions, ulcers . palpation of skin: no induration or nodules Neurologic:  . CN 2-12 intact . Sensation all 4 extremities intact Psychiatric:  . Mental status o Mood, affect appropriate o Orientation to person, place, time  . judgment and insight appear intact  I have personally reviewed the following:   Today's Data  . Vitals, Creatinine  Scheduled Meds: .  stroke: mapping our early stages of recovery book   Does not apply Once  . aspirin  81 mg Oral Daily  . atorvastatin  20 mg Oral QHS  . calcium-vitamin D  1 tablet Oral Q breakfast  . cholecalciferol  400 Units Oral Daily  . docusate sodium  100 mg Oral BID  . enoxaparin (LOVENOX) injection  40 mg Subcutaneous Q24H  . fluticasone  1 spray Each Nare BID  . gabapentin  300 mg Oral TID  . HYDROcodone-acetaminophen  1 tablet Oral Q8H  . insulin aspart  0-5 Units Subcutaneous QHS  . insulin aspart  0-9  Units Subcutaneous TID WC  . lactulose  10 g Oral q AM  . levothyroxine  88 mcg Oral QAC breakfast  . loratadine  10 mg Oral Daily  . metoprolol tartrate  25 mg Oral BID  . nortriptyline  25 mg Oral QHS  . pantoprazole  40 mg Oral Daily  . predniSONE  40 mg Oral BID WC   Continuous Infusions:   Principal Problem:   Weakness of both lower extremities Active Problems:   Generalized weakness   PMR (polymyalgia rheumatica) (HCC)   GERD (gastroesophageal reflux disease)   Fibromyalgia    Essential hypertension   Hypothyroidism   Ambulatory dysfunction   Accident due to mechanical fall without injury   TIA (transient ischemic attack)   LOS: 10 days   A & P  Acute flareup of polymyalgia rheumatica: Presented with few days of progressively worsening bilateral lower extremities pain, generalized weakness, shakiness blurring of vision and a fall.  Able to walk sideways but not forwards. She has a history of polymyalgia rheumatica with chronic bilateral lower extremity pain.  She last had a flare up of 10 years ago, ended up in an LTAC for intensive rehab.  She improved with prednisone and had to take it for 3 to 4 years and gradually tapered off it. Neurology consultation obtained.  Patient needs currently on high-dose of Solu-Medrol 500 mg IV daily. Have reduced steroids to prednisone 40 mg bid as was recommended for discharge dose. For pain control, she is scheduled Norco 5 mg every 8 hours. We will change it to as needed once pain controlled. Continue increased dose of gabapentin 300 mg 3 times daily. Continue to monitor mental status.  Stroke ruled out: Normal MRI brain. Continue primary prophylaxis with patient is on aspirin 81 mg daily and statin.  Potential need of long-term steroids: According to patient's daughter, patient was on prednisone for about 3 years after her last flareup.  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 There is chance patient will be reinitiated on long course of steroids at this time as well. She has been reduced to prednisone 40 mg bid. Currently I have her on Protonix and sliding scale insulin with Accu-Cheks.  Bilateral lower extremity swelling and pain: Suspect secondary to PMR.  Ultrasound duplex of lower extremities ruled out DVT.  DM II: FSBS has been 108-143 in the last 24 hours. Continue to follow FSBS and SSI.  Fibromyalgia: Continue gabapentin and  nortriptyline.  Essential hypertension: Continue Lopressor per home regimen  Hypothyroidism: Continue Synthroid per home regimen. Will recheck TSH T3, and T4.  GERD: Continue Protonix  I have seen and examined this patient myself. I have spent 30 minutes in her evaluation and care.  Code Status:  Code Status: Full Code  DVT prophylaxis: enoxaparin (LOVENOX) injection 40 mg Start: 11/23/19 1000 SCDs Start: 11/23/19 0323 Family Communication:None available Status is: Inpatient  Remains inpatient appropriate because currently remains on high-dose IV steroids continues to have significant amount of pain and weakness.  Dispo: The patient is from: Home  Anticipated d/c is to: SNF  Anticipated d/c date is: 2 days  Patient currently is not medically stable to d/c.  Kayden Hutmacher, DO [facility] Direct contact: see [email or web address]  7PM-7AM contact night coverage as above 12/03/2019, 4:22 PM  LOS: 7 days

## 2019-12-03 NOTE — Progress Notes (Signed)
Report called to [facility] to transport approximately 1630. Patient made aware, will continue to monitor.

## 2019-12-03 NOTE — Discharge Summary (Addendum)
Physician Discharge Summary  Glenn Christo Sawaya DHR:[id number] DOB: 1940-04-15 DOA: 11/22/2019  PCP: Christina Ping, MD  Admit date: 11/22/2019 Discharge date: 12/03/2019  Admitted From: Home Discharge disposition: SNF   Code Status: Full Code  Diet Recommendation: Cardiac/diabetic diet  Discharge Diagnosis:   Principal Problem:   Weakness of both lower extremities Active Problems:   Generalized weakness   PMR (polymyalgia rheumatica) (HCC)   GERD (gastroesophageal reflux disease)   Fibromyalgia   Essential hypertension   Hypothyroidism   Ambulatory dysfunction   Accident due to mechanical fall without injury   TIA (transient ischemic attack)   History of Present Illness / Brief narrative:  Christina Fry is a 79 y.o. female with PMH of polymyalgia rheumatica, fibromyalgia, GERD, hypertension, hypothyroidism. Patient presented to the ED on 11/22/2019 with progressively worsening generalized weakness, shakiness and multiple tender points in the body for last few days culminating to a fall on 9/10. At baseline, patient ambulates with a walker but she has been avoiding ambulating last few days in fear of falling.  She also complains of worsening pain and swelling of both lower extremities and also complains of body pain and back pain even on coughing.  Since the night before, patient also has blurring of vision on the left eye. She has a history of polymyalgia rheumatica with an acute flare of 10 years ago, ended up in an LTAC for intensive rehab.  She improved with prednisone and had to take it for 3 to 4 years and gradually tapered off it. [facility]  In the ED, patient was hemodynamically stable. Labs unremarkable.   CT scan of head, cervical spine, thoracic spine, lumbar spine did not show any acute abnormality.  Chronic T5 compression fracture was noted.   Chest x-ray showed emphysematous and chronic bronchitis changes in the lungs with no evidence of active pulmonary  disease.  Patient was admitted under hospitalist service for further evaluation management  Subjective:  Seen and examined this morning. Lying down in bed. Continues to have chronic bilateral lower extremity pain, somewhat better. Weakness gradually improving. Completed course of high-dose IV Solu-Medrol on 12/01/2019. Converted to Prednisone 40 mg bid.  Hospital Course:  Acute flareup of polymyalgia rheumatica -Presented with few days of progressively worsening bilateral lower extremities pain, generalized weakness, shakiness blurring of vision and a fall.  Able to walk sideways but not forwards. -She has a history of polymyalgia rheumatica with chronic bilateral lower extremity pain.  She last had a flare up of 10 years ago, ended up in an LTAC for intensive rehab.  She improved with prednisone and had to take it for 3 to 4 years and gradually tapered off it. -Neurology consultation was obtained..  Patient was treated with high-dose of Solu-Medrol 500 mg IV daily, last dose today. -Discussed with neurologist Dr. Merlene Laughter this morning.  Recommended to discharge the patient on prednisone 40 mg twice daily.  She most likely will need long-term steroids.  She would need to follow-up with neurologist as an outpatient for further determination. -For pain control, she will be discharged on Neurontin 600 mg 3 times daily, Norco 5 mg every 8 hours PRN.   -Continue to monitor mental status at SNF.  Stroke ruled out -Normal MRI brain. Continue primary prophylaxis with patient is on aspirin 81 mg daily and statin.  Long-term use of steroids -According to patient's daughter, patient was on prednisone for about 3 years after her last flareup.  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  -With the current flareup, patient has been reinitiated on prednisone 40 mg twice daily.  She will most likely need for long-term. -I will  discharge her on insulin regimen, scheduled PPI twice daily as well as calcium and vitamin D supplementation.   Hyperglycemia -A1c 5.6. -Blood sugar level is trending up because of steroids. -I will discharge her on sliding scale insulin..  She needs to have blood sugar monitored at SNF.  May need long-acting insulin started as well next several days.  Bilateral lower extremity swelling and pain -Suspect secondary to PMR.  Ultrasound duplex of lower extremities ruled out DVT.   Fibromyalgia -Continue gabapentin, Norco and nortriptyline. I have added a fentanyl patch for more consistent coverage. May need to be adjusted at facility.  Essential hypertension -Continue Lopressor per home regimen  Hypothyroidism -Continue Synthroid per home regimen  GERD -Continue Protonix  Stable for discharge to SNF today.  Wound care: Incision 03/20/12 Abdomen Other (Comment) (Active)  Date First Assessed/Time First Assessed: 03/20/12 1023   Location: Abdomen  Location Orientation: Other (Comment)    Assessments 03/20/2012 11:39 AM 03/26/2012  8:00 AM  Site / Wound Assessment Clean;Dry Clean;Dry  Drainage Amount None None     No Linked orders to display     Incision - 3 Ports Abdomen 1: Left;Upper 2: Left;Lower 3: Right;Upper (Active)  Placement Date/Time: 03/20/12 1008   Location of Ports: Abdomen  Port: 1:  Location Orientation: Left;Upper  Port: 2:  Location Orientation: Left;Lower  Port: 3:  Location Orientation: Right;Upper    Assessments 03/20/2012 11:50 AM 03/26/2012  8:00 AM  Port 1 Site Assessment Clean;Dry Clean;Dry  Port 1 Drainage Amount None None  Port 2 Site Assessment Clean;Dry Clean;Dry  Port 2 Drainage Amount None None  Port 3 Site Assessment Clean;Dry Clean;Dry  Port 3 Drainage Amount None None     No Linked orders to display     Incision - 1 Port Abdomen 1: Right;Lower (Active)  Placement Date/Time: 03/20/12 1055   Location of Ports: Abdomen  Port: 1:  Location  Orientation: Right;Lower    Assessments 03/20/2012 11:50 AM 03/26/2012  8:00 AM  Port 1 Site Assessment Clean;Dry Clean;Dry  Port 1 Drainage Amount None None     No Linked orders to display     Incision - 5 Ports Abdomen Right;Upper Right;Lower Left;Upper Left;Lower Medial (Active)  Placement Date: 03/20/12   Location of Ports: Abdomen  Location Orientation: Right;Upper  Location Orientation: Right;Lower  Location Orientation: Left;Upper  Location Orientation: Left;Lower  Location Orientation: Medial    Assessments 03/21/2012 10:41 PM 03/22/2012 11:44 PM  Port 1 Site Assessment Clean;Dry --  Port 1 Drainage Amount None --  Port 1 Dressing Status Clean;Dry;Intact Clean;Dry  Port 2 Drainage Amount None --  Port 2 Dressing Status Clean;Dry;Intact Clean;Dry  Port 3 Drainage Amount None --  Port 3 Dressing Status Clean;Dry;Intact Clean;Dry  Port 4 Drainage Amount None --  Port 4 Dressing Status Clean;Dry;Intact --  Port 5 Drainage Amount None --  Port 5 Dressing Status Clean;Dry;Intact --     No Linked orders to display    Discharge Exam:   Vitals:   12/02/19 1449 12/02/19 2210 12/03/19 0523 12/03/19 0919  BP: (!) 153/55 (!) 144/78 (!) 147/63 (!) 132/54  Pulse: 75 68 (!) 55 71  Resp: 16 14 14 16   Temp: 98.9 F (37.2 C) 98.6 F (37 C) 98.1  F (36.7 C) 98.9 F (37.2 C)  TempSrc: Oral Oral Oral Oral  SpO2: 97% 97% 97% 98%  Weight:   73.4 kg   Height:        Body mass index is 28.66 kg/m.  General exam: Appears calm and comfortable.  Skin: No rashes, lesions or ulcers. HEENT: Atraumatic, normocephalic, supple neck, no obvious bleeding Lungs: Clear to auscultation bilaterally CVS: Regular rate and rhythm, no murmur GI/Abd soft, nontender, nondistended, bowel sound present CNS: Alert, awake, oriented x3, continues to have weakness in lower extremities Psychiatry: Mood appropriate Extremities: Mild bilateral lower extremity swelling.  Follow ups:   Discharge Instructions     Activity as tolerated - No restrictions   Complete by: As directed    Ambulatory referral to Neurology   Complete by: As directed    Call MD for:  persistant nausea and vomiting   Complete by: As directed    Call MD for:  severe uncontrolled pain   Complete by: As directed    Diet - low sodium heart healthy   Complete by: As directed    Diet Carb Modified   Complete by: As directed    Discharge instructions   Complete by: As directed    Discharge to SNF for rehab Check glucoses once daily. Report to facility physician if greater than 300 or less than 70. Follow up with PCP in 7-10 days after discharge.   Increase activity slowly   Complete by: As directed    Increase activity slowly   Complete by: As directed       Contact information for follow-up providers    D'Silva, Belva Crome, MD Follow up.   Specialty: Internal Medicine Contact information: [place] [address] [phone]        Phillips Odor, MD Follow up.   Specialty: Neurology Contact information: [address] [place] [address] [phone]            Contact information for after-discharge care    Destination    [facility] .   Service: Skilled Nursing Contact information: [address] [place] [phone]                  Recommendations for Outpatient Follow-Up:   1. Follow-up with PCP as an outpatient 2. Follow-up with neurology as an outpatient  Discharge Instructions:  Follow with Primary MD D'Silva, Belva Crome, MD in 7 days   Get CBC/BMP checked in next visit within 1 week by PCP or SNF MD ( we routinely change or add medications that can affect your baseline labs and fluid status, therefore we recommend that you get the mentioned basic workup next visit with your PCP, your PCP may decide not to get them or add new tests based on their clinical decision)  On your next visit with your PCP,  please Get Medicines reviewed and adjusted.  Please request your PCP  to go over all Hospital Tests and Procedure/Radiological results at the follow up, please get all Hospital records sent to your Prim MD by signing hospital release before you go home.  Activity: As tolerated with Full fall precautions use walker/cane & assistance as needed  For Heart failure patients - Check your Weight same time everyday, if you gain over 2 pounds, or you develop in leg swelling, experience more shortness of breath or chest pain, call your Primary MD immediately. Follow Cardiac Low Salt Diet and 1.5 lit/day fluid  restriction.  If you have smoked or chewed Tobacco in the last 2 yrs please stop smoking, stop any regular Alcohol  and or any Recreational drug use.  If you experience worsening of your admission symptoms, develop shortness of breath, life threatening emergency, suicidal or homicidal thoughts you must seek medical attention immediately by calling 911 or calling your MD immediately  if symptoms less severe.  You Must read complete instructions/literature along with all the possible adverse reactions/side effects for all the Medicines you take and that have been prescribed to you. Take any new Medicines after you have completely understood and accpet all the possible adverse reactions/side effects.   Do not drive, operate heavy machinery, perform activities at heights, swimming or participation in water activities or provide baby sitting services if your were admitted for syncope or siezures until you have seen by Primary MD or a Neurologist and advised to do so again.  Do not drive when taking Pain medications.  Do not take more than prescribed Pain, Sleep and Anxiety Medications  Wear Seat belts while driving.   Please note You were cared for by a hospitalist during your hospital stay. If you have any questions about your discharge medications or the care you received while you were in the hospital  after you are discharged, you can call the unit and asked to speak with the hospitalist on call if the hospitalist that took care of you is not available. Once you are discharged, your primary care physician will handle any further medical issues. Please note that NO REFILLS for any discharge medications will be authorized once you are discharged, as it is imperative that you return to your primary care physician (or establish a relationship with a primary care physician if you do not have one) for your aftercare needs so that they can reassess your need for medications and monitor your lab values.    Allergies as of 12/04/2019      Reactions   Iohexol Shortness Of Breath   Pt stopped breathing at [place]. Pt refuses IV dye   Codeine Itching   Morphine And Related Itching   Nitrofuran Derivatives Other (See Comments)   Unknown   Oxycodone-acetaminophen Itching      Medication List    STOP taking these medications   Calcium 600+D High Potency 600-400 MG-UNIT tablet Generic drug: Calcium Carbonate-Vitamin D Replaced by: calcium-vitamin D 500-200 MG-UNIT tablet   traMADol 50 MG tablet Commonly known as: ULTRAM     TAKE these medications   acetaminophen 325 MG tablet Commonly known as: TYLENOL Take 650 mg by mouth in the morning and at bedtime.   albuterol 108 (90 Base) MCG/ACT inhaler Commonly known as: VENTOLIN HFA Inhale 3 puffs into the lungs every 6 (six) hours as needed for wheezing or shortness of breath.   aspirin 81 MG chewable tablet Chew 1 tablet (81 mg total) by mouth daily.   atorvastatin 20 MG tablet Commonly known as: LIPITOR Take 20 mg by mouth at bedtime.   calcium-vitamin D 500-200 MG-UNIT tablet Commonly known as: OSCAL WITH D Take 1 tablet by mouth daily with breakfast. Replaces: Calcium 600+D High Potency 600-400 MG-UNIT tablet   carboxymethylcellulose 1 % ophthalmic solution Apply 1-4 drops to eye every 2 (two) hours while awake. 3-4 drops in right  eye, 1-2 drops in left eye every 2 hours until bedtime.   Docusate Sodium 100 MG capsule Take 100 mg by mouth 2 (two) times daily.   EPINEPHrine 0.3 mg/0.3  mL Soaj injection Commonly known as: EPI-PEN Inject 0.3 mg into the muscle as needed for anaphylaxis.   feeding supplement (GLUCERNA SHAKE) Liqd Take 237 mLs by mouth 3 (three) times daily between meals.   fentaNYL 12 MCG/HR Commonly known as: [place] 1 patch onto the skin every 3 (three) days.   fluticasone 50 MCG/ACT nasal spray Commonly known as: FLONASE Place 1 spray into both nostrils 2 (two) times daily.   gabapentin 100 MG capsule Commonly known as: NEURONTIN Take 200 mg by mouth at bedtime.   HYDROcodone-acetaminophen 5-325 MG tablet Commonly known as: NORCO/VICODIN Take 1 tablet by mouth every 8 (eight) hours for 5 days.   hydrOXYzine 10 MG tablet Commonly known as: ATARAX/VISTARIL Take 1 tablet (10 mg total) by mouth 2 (two) times daily as needed for itching.   insulin aspart 100 UNIT/ML injection Commonly known as: novoLOG Inject 0-5 Units into the skin at bedtime.   insulin aspart 100 UNIT/ML injection Commonly known as: novoLOG Inject 0-9 Units into the skin 3 (three) times daily with meals.   lactulose 10 GM/15ML solution Commonly known as: CHRONULAC Take 10 g by mouth in the morning.   levothyroxine 88 MCG tablet Commonly known as: SYNTHROID Take 88 mcg by mouth daily before breakfast. 30 minutes prior to meal or patient will not eat meal   loratadine 10 MG tablet Commonly known as: CLARITIN Take 10 mg by mouth daily.   metoprolol tartrate 25 MG tablet Commonly known as: LOPRESSOR Take 12.5 mg by mouth 2 (two) times daily.   nortriptyline 25 MG capsule Commonly known as: PAMELOR Take 25 mg by mouth at bedtime.   omeprazole 20 MG capsule Commonly known as: PRILOSEC Take 20 mg by mouth 2 (two) times daily.   predniSONE 20 MG tablet Commonly known as: DELTASONE Take 2 tablets  (40 mg total) by mouth 2 (two) times daily with a meal.      Time coordinating discharge: 38 minutes  The results of significant diagnostics from this hospitalization (including imaging, microbiology, ancillary and laboratory) are listed below for reference.    Procedures and Diagnostic Studies:   CT Head Wo Contrast  Result Date: 11/22/2019 CLINICAL DATA:  Generalized weakness. EXAM: CT HEAD WITHOUT CONTRAST TECHNIQUE: Contiguous axial images were obtained from the base of the skull through the vertex without intravenous contrast. COMPARISON:  None. FINDINGS: Brain: There is mild cerebral atrophy with widening of the extra-axial spaces and ventricular dilatation. There are areas of decreased attenuation within the white matter tracts of the supratentorial brain, consistent with microvascular disease changes. Vascular: No hyperdense vessel or unexpected calcification. Skull: Negative for acute fracture or focal lesion. Sinuses/Orbits: Chronic deformities are seen along the medial walls of the bilateral orbits. The paranasal sinuses are otherwise normal in appearance. Other: None. IMPRESSION: 1. Generalized cerebral atrophy. 2. No acute intracranial abnormality. Electronically Signed   By: Virgina Norfolk M.D.   On: 11/22/2019 23:52   CT Cervical Spine Wo Contrast  Result Date: 11/23/2019 CLINICAL DATA:  Initial evaluation for weakness, fall, mid back pain. EXAM: CT CERVICAL, THORACIC, AND LUMBAR SPINE WITHOUT CONTRAST TECHNIQUE: Multidetector CT imaging of the cervical, thoracic and lumbar spine was performed without intravenous contrast. Multiplanar CT image reconstructions were also generated. COMPARISON:  Prior CT from 03/21/2010. FINDINGS: CT CERVICAL SPINE FINDINGS Alignment: Straightening of the normal cervical lordosis. Trace anterolisthesis of C7 on T1, likely chronic and facet mediated. No malalignment. Skull base and vertebrae: Skull base intact. Normal C1-2 articulations are preserved  in  the dens is intact. Vertebral body heights maintained. No acute fracture. Soft tissues and spinal canal: Soft tissues of the neck demonstrate no acute finding. No abnormal prevertebral edema. Spinal canal within normal limits. Vascular calcifications about the carotid bifurcations. Disc levels: Prominent degenerative changes noted about the C1-2 articulation. Mild cervical spondylosis noted at C4-5 through C6-7 without significant stenosis. Moderate multilevel left-sided facet hypertrophy. Upper chest: Visualized upper chest demonstrates no acute finding. Partially visualized lung apices are clear. Other: None. CT THORACIC SPINE FINDINGS Alignment: Mild scoliosis. Alignment otherwise normal with preservation of the normal thoracic kyphosis. No listhesis or malalignment. Vertebrae: Compression deformity involving what is favored to be the T5 vertebral body with up to 50% height loss without significant bony retropulsion, chronic in appearance. Vertebral body height otherwise maintained without acute fracture. No discrete or worrisome osseous lesions. Visualized ribs are grossly intact. Paraspinal and other soft tissues: Paraspinous soft tissues within normal limits. Partially visualized lungs are grossly clear. Aortic atherosclerosis. Disc levels: No significant disc pathology seen within the thoracic spine. No appreciable stenosis. CT LUMBAR SPINE FINDINGS Segmentation: Standard. Alignment: Trace dextroscoliosis. Alignment otherwise normal with preservation of the normal lumbar lordosis. No listhesis or malalignment. Vertebrae: Vertebral body height maintained without acute or chronic fracture. Visualized sacrum and pelvis intact. SI joints approximated symmetric. No worrisome osseous lesions. Paraspinal and other soft tissues: Paraspinous soft tissues demonstrate no acute finding. Aortic atherosclerosis. Diverticulosis noted about the partially visualized sigmoid colon. Visualized visceral structures otherwise  unremarkable. Disc levels: L1-2:  Unremarkable. L2-3:  Unremarkable. L3-4: No significant disc bulge. Mild facet hypertrophy. No stenosis. L4-5: Minimal disc bulge. Bilateral facet hypertrophy. No significant stenosis. L5-S1: Degenerative intervertebral disc space narrowing with diffuse disc bulge and reactive endplate change. Moderate facet hypertrophy. No significant spinal stenosis. Mild bilateral L5 foraminal narrowing. IMPRESSION: 1. No acute traumatic injury within the cervical, thoracic, or lumbar spine. 2. 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 3. 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 4.  Aortic Atherosclerosis (ICD10-I70.0). Electronically Signed   By: Jeannine Boga M.D.   On: 11/23/2019 05:49   CT Thoracic Spine Wo Contrast  Result Date: 11/23/2019 CLINICAL DATA:  Initial evaluation for weakness, fall, mid back pain. EXAM: CT CERVICAL, THORACIC, AND LUMBAR SPINE WITHOUT CONTRAST TECHNIQUE: Multidetector CT imaging of the cervical, thoracic and lumbar spine was performed without intravenous contrast. Multiplanar CT image reconstructions were also generated. COMPARISON:  Prior CT from 03/21/2010. FINDINGS: CT CERVICAL SPINE FINDINGS Alignment: Straightening of the normal cervical lordosis. Trace anterolisthesis of C7 on T1, likely chronic and facet mediated. No malalignment. Skull base and vertebrae: Skull base intact. Normal C1-2 articulations are preserved in the dens is intact. Vertebral body heights maintained. No acute fracture. Soft tissues and spinal canal: Soft tissues of the neck demonstrate no acute finding. No abnormal prevertebral edema. Spinal canal within normal limits. Vascular calcifications about the carotid bifurcations. Disc levels: Prominent degenerative changes noted about the C1-2 articulation. Mild cervical spondylosis noted at C4-5 through C6-7 without significant stenosis.  Moderate multilevel left-sided facet hypertrophy. Upper chest: Visualized upper chest demonstrates no acute finding. Partially visualized lung apices are clear. Other: None. CT THORACIC SPINE FINDINGS Alignment: Mild scoliosis. Alignment otherwise normal with preservation of the normal thoracic kyphosis. No listhesis or malalignment. Vertebrae: Compression deformity involving what is favored to be the T5 vertebral body with up to 50% height loss without significant bony retropulsion, chronic in appearance.  Vertebral body height otherwise maintained without acute fracture. No discrete or worrisome osseous lesions. Visualized ribs are grossly intact. Paraspinal and other soft tissues: Paraspinous soft tissues within normal limits. Partially visualized lungs are grossly clear. Aortic atherosclerosis. Disc levels: No significant disc pathology seen within the thoracic spine. No appreciable stenosis. CT LUMBAR SPINE FINDINGS Segmentation: Standard. Alignment: Trace dextroscoliosis. Alignment otherwise normal with preservation of the normal lumbar lordosis. No listhesis or malalignment. Vertebrae: Vertebral body height maintained without acute or chronic fracture. Visualized sacrum and pelvis intact. SI joints approximated symmetric. No worrisome osseous lesions. Paraspinal and other soft tissues: Paraspinous soft tissues demonstrate no acute finding. Aortic atherosclerosis. Diverticulosis noted about the partially visualized sigmoid colon. Visualized visceral structures otherwise unremarkable. Disc levels: L1-2:  Unremarkable. L2-3:  Unremarkable. L3-4: No significant disc bulge. Mild facet hypertrophy. No stenosis. L4-5: Minimal disc bulge. Bilateral facet hypertrophy. No significant stenosis. L5-S1: Degenerative intervertebral disc space narrowing with diffuse disc bulge and reactive endplate change. Moderate facet hypertrophy. No significant spinal stenosis. Mild bilateral L5 foraminal narrowing. IMPRESSION: 1. No  acute traumatic injury within the cervical, thoracic, or lumbar spine. 2. 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 3. 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 4.  Aortic Atherosclerosis (ICD10-I70.0). Electronically Signed   By: Jeannine Boga M.D.   On: 11/23/2019 05:49   CT Lumbar Spine Wo Contrast  Result Date: 11/23/2019 CLINICAL DATA:  Initial evaluation for weakness, fall, mid back pain. EXAM: CT CERVICAL, THORACIC, AND LUMBAR SPINE WITHOUT CONTRAST TECHNIQUE: Multidetector CT imaging of the cervical, thoracic and lumbar spine was performed without intravenous contrast. Multiplanar CT image reconstructions were also generated. COMPARISON:  Prior CT from 03/21/2010. FINDINGS: CT CERVICAL SPINE FINDINGS Alignment: Straightening of the normal cervical lordosis. Trace anterolisthesis of C7 on T1, likely chronic and facet mediated. No malalignment. Skull base and vertebrae: Skull base intact. Normal C1-2 articulations are preserved in the dens is intact. Vertebral body heights maintained. No acute fracture. Soft tissues and spinal canal: Soft tissues of the neck demonstrate no acute finding. No abnormal prevertebral edema. Spinal canal within normal limits. Vascular calcifications about the carotid bifurcations. Disc levels: Prominent degenerative changes noted about the C1-2 articulation. Mild cervical spondylosis noted at C4-5 through C6-7 without significant stenosis. Moderate multilevel left-sided facet hypertrophy. Upper chest: Visualized upper chest demonstrates no acute finding. Partially visualized lung apices are clear. Other: None. CT THORACIC SPINE FINDINGS Alignment: Mild scoliosis. Alignment otherwise normal with preservation of the normal thoracic kyphosis. No listhesis or malalignment. Vertebrae: Compression deformity involving what is favored to be the T5 vertebral body with up to 50% height loss  without significant bony retropulsion, chronic in appearance. Vertebral body height otherwise maintained without acute fracture. No discrete or worrisome osseous lesions. Visualized ribs are grossly intact. Paraspinal and other soft tissues: Paraspinous soft tissues within normal limits. Partially visualized lungs are grossly clear. Aortic atherosclerosis. Disc levels: No significant disc pathology seen within the thoracic spine. No appreciable stenosis. CT LUMBAR SPINE FINDINGS Segmentation: Standard. Alignment: Trace dextroscoliosis. Alignment otherwise normal with preservation of the normal lumbar lordosis. No listhesis or malalignment. Vertebrae: Vertebral body height maintained without acute or chronic fracture. Visualized sacrum and pelvis intact. SI joints approximated symmetric. No worrisome osseous lesions. Paraspinal and other soft tissues: Paraspinous soft tissues demonstrate no acute finding. Aortic atherosclerosis. Diverticulosis noted about the partially visualized sigmoid colon. Visualized visceral structures otherwise unremarkable. Disc levels: L1-2:  Unremarkable. L2-3:  Unremarkable. L3-4: No significant disc bulge. Mild facet hypertrophy. No stenosis. L4-5: Minimal disc bulge. Bilateral facet hypertrophy. No significant stenosis. L5-S1: Degenerative intervertebral disc space narrowing with diffuse disc bulge and reactive endplate change. Moderate facet hypertrophy. No significant spinal stenosis. Mild bilateral L5 foraminal narrowing. IMPRESSION: 1. No acute traumatic injury within the cervical, thoracic, or lumbar spine. 2. 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 3. 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 4.  Aortic Atherosclerosis (ICD10-I70.0). Electronically Signed   By: Jeannine Boga M.D.   On: 11/23/2019 05:49   US Carotid Bilateral (at [facility] and [facility] only)  Result Date:  11/23/2019 CLINICAL DATA:  Generalized weakness, shakiness and blurred vision. EXAM: BILATERAL CAROTID DUPLEX ULTRASOUND TECHNIQUE: Pearline Cables scale imaging, color Doppler and duplex ultrasound were performed of bilateral carotid and vertebral arteries in the neck. COMPARISON:  None. FINDINGS: Criteria: Quantification of carotid stenosis is based on velocity parameters that correlate the residual internal carotid diameter with NASCET-based stenosis levels, using the diameter of the distal internal carotid lumen as the denominator for stenosis measurement. The following velocity measurements were obtained: RIGHT ICA:  122/31 cm/sec CCA:  74/08 cm/sec SYSTOLIC ICA/CCA RATIO:  1.3 ECA:  91 cm/sec LEFT ICA:  119/34 cm/sec CCA:  14/4 cm/sec SYSTOLIC ICA/CCA RATIO:  1.9 ECA:  92 cm/sec RIGHT CAROTID ARTERY: There is a mild amount of predominately calcified plaque at right carotid bulb and proximal right ICA. Estimated right ICA stenosis is less than 50%. RIGHT VERTEBRAL ARTERY: Antegrade flow with normal waveform and velocity. LEFT CAROTID ARTERY: Mild amount of partially calcified plaque is present at the level of the carotid bulb. No significant left ICA plaque or evidence of left ICA stenosis. LEFT VERTEBRAL ARTERY: Antegrade flow with normal waveform and velocity. IMPRESSION: Mild amount of plaque at the level of both carotid bulbs and the proximal right ICA. Estimated right ICA stenosis is less than 50%. No evidence of left ICA stenosis in the neck. Electronically Signed   By: Aletta Edouard M.D.   On: 11/23/2019 11:15   US Venous Img Lower Bilateral (DVT)  Result Date: 11/23/2019 CLINICAL DATA:  Lower extremity edema bilaterally EXAM: BILATERAL LOWER EXTREMITY VENOUS DUPLEX ULTRASOUND TECHNIQUE: Gray-scale sonography with graded compression, as well as color Doppler and duplex ultrasound were performed to evaluate the lower extremity deep venous systems from the level of the common femoral vein and including the  common femoral, femoral, profunda femoral, popliteal and calf veins including the posterior tibial, peroneal and gastrocnemius veins when visible. The superficial great saphenous vein was also interrogated. Spectral Doppler was utilized to evaluate flow at rest and with distal augmentation maneuvers in the common femoral, femoral and popliteal veins. COMPARISON:  None. FINDINGS: RIGHT LOWER EXTREMITY Common Femoral Vein: No evidence of thrombus. Normal compressibility, respiratory phasicity and response to augmentation. Saphenofemoral Junction: No evidence of thrombus. Normal compressibility and flow on color Doppler imaging. Profunda Femoral Vein: No evidence of thrombus. Normal compressibility and flow on color Doppler imaging. Femoral Vein: No evidence of thrombus. Normal compressibility, respiratory phasicity and response to augmentation. Popliteal Vein: No evidence of thrombus. Normal compressibility, respiratory phasicity and response to augmentation. Calf Veins: No evidence of thrombus. Normal compressibility and flow on color Doppler imaging. Superficial Great Saphenous Vein: No evidence of thrombus. Normal compressibility. Venous Reflux:  None. Other Findings:  None. LEFT LOWER EXTREMITY Common Femoral Vein: No evidence of thrombus. Normal compressibility, respiratory phasicity and response  to augmentation. Saphenofemoral Junction: No evidence of thrombus. Normal compressibility and flow on color Doppler imaging. Profunda Femoral Vein: No evidence of thrombus. Normal compressibility and flow on color Doppler imaging. Femoral Vein: No evidence of thrombus. Normal compressibility, respiratory phasicity and response to augmentation. Popliteal Vein: No evidence of thrombus. Normal compressibility, respiratory phasicity and response to augmentation. Calf Veins: No evidence of thrombus. Normal compressibility and flow on color Doppler imaging. Superficial Great Saphenous Vein: No evidence of thrombus. Normal  compressibility. Venous Reflux:  None. Other Findings:  None. IMPRESSION: No evidence of deep venous thrombosis in either lower extremity. Electronically Signed   By: Lowella Grip III M.D.   On: 11/23/2019 10:55   DG Chest Port 1 View  Result Date: 11/23/2019 CLINICAL DATA:  Generalized weakness. EXAM: PORTABLE CHEST 1 VIEW COMPARISON:  04/01/2014 FINDINGS: Heart size and pulmonary vascularity are normal. Emphysematous changes in the lungs. Peribronchial thickening and streaky perihilar opacities suggesting chronic bronchitis. No focal consolidation. No pleural effusions. No pneumothorax. Mediastinal contours appear intact. Degenerative changes in the spine and shoulders. Old bilateral rib fractures. 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 No evidence of active pulmonary disease. Electronically Signed   By: Lucienne Capers M.D.   On: 11/23/2019 00:17   ECHOCARDIOGRAM COMPLETE  Result Date: 11/23/2019    ECHOCARDIOGRAM REPORT   Patient Name:   Christina Fry [facility] Date of Exam: 11/23/2019 Medical Rec #:  [id number]    Height:       63.0 in Accession #:    [id number]   Weight:       161.0 lb Date of Birth:  Apr 03, 1940     BSA:          1.763 m Patient Age:    8 years     BP:           158/70 mmHg Patient Gender: F            HR:           73 bpm. Exam Location:  [facility] Procedure: 2D Echo Indications:    TIA G45.9  History:        Patient has no prior history of Echocardiogram examinations.                 Risk Factors:Dyslipidemia and Diabetes.  Sonographer:    Mikki Santee RDCS (AE) Referring Phys: [id number] [place]  1. Left ventricular ejection fraction, by estimation, is 60 to 65%. The left ventricle has normal function. The left ventricle has no regional wall motion abnormalities. There is severe left ventricular hypertrophy of the basal-septal segment. Left ventricular diastolic parameters are consistent with Grade I diastolic dysfunction (impaired  relaxation). Elevated left ventricular end-diastolic pressure.  2. Right ventricular systolic function is mildly reduced. The right ventricular size is normal. Tricuspid regurgitation signal is inadequate for assessing PA pressure.  3. The mitral valve is normal in structure. Trivial mitral valve regurgitation. No evidence of mitral stenosis.  4. The aortic valve is tricuspid. Aortic valve regurgitation is trivial. Mild to moderate aortic valve sclerosis/calcification is present, without any evidence of aortic stenosis.  5. The inferior vena cava is normal in size with greater than 50% respiratory variability, suggesting right atrial pressure of 3 mmHg. FINDINGS  Left Ventricle: Left ventricular ejection fraction, by estimation, is 60 to 65%. The left ventricle has normal function. The left ventricle has no regional wall motion abnormalities. The left ventricular internal cavity size was normal in size.  There is  severe left ventricular hypertrophy of the basal-septal segment. Left ventricular diastolic parameters are consistent with Grade I diastolic dysfunction (impaired relaxation). Elevated left ventricular end-diastolic pressure. Right Ventricle: The right ventricular size is normal. No increase in right ventricular wall thickness. Right ventricular systolic function is mildly reduced. Tricuspid regurgitation signal is inadequate for assessing PA pressure. Left Atrium: Left atrial size was normal in size. Right Atrium: Right atrial size was normal in size. Pericardium: There is no evidence of pericardial effusion. Mitral Valve: The mitral valve is normal in structure. Trivial mitral valve regurgitation. No evidence of mitral valve stenosis. Tricuspid Valve: The tricuspid valve is normal in structure. Tricuspid valve regurgitation is trivial. No evidence of tricuspid stenosis. Aortic Valve: The aortic valve is tricuspid. Aortic valve regurgitation is trivial. Mild to moderate aortic valve sclerosis/calcification  is present, without any evidence of aortic stenosis. Pulmonic Valve: The pulmonic valve was normal in structure. Pulmonic valve regurgitation is not visualized. No evidence of pulmonic stenosis. Aorta: The aortic root is normal in size and structure. Venous: The inferior vena cava is normal in size with greater than 50% respiratory variability, suggesting right atrial pressure of 3 mmHg. IAS/Shunts: No atrial level shunt detected by color flow Doppler.  LEFT VENTRICLE PLAX 2D LVIDd:         3.66 cm  Diastology LVIDs:         2.48 cm  LV e' medial:    5.45 cm/s LV PW:         1.10 cm  LV E/e' medial:  16.7 LV IVS:        1.66 cm  LV e' lateral:   5.00 cm/s LVOT diam:     2.10 cm  LV E/e' lateral: 18.2 LV SV:         87 LV SV Index:   49 LVOT Area:     3.46 cm  RIGHT VENTRICLE RV S prime:     8.49 cm/s TAPSE (M-mode): 1.5 cm LEFT ATRIUM             Index       RIGHT ATRIUM           Index LA diam:        3.70 cm 2.10 cm/m  RA Area:     10.20 cm LA Vol (A2C):   58.9 ml 33.40 ml/m RA Volume:   21.90 ml  12.42 ml/m LA Vol (A4C):   38.2 ml 21.66 ml/m LA Biplane Vol: 48.8 ml 27.67 ml/m  AORTIC VALVE LVOT Vmax:   96.70 cm/s LVOT Vmean:  67.900 cm/s LVOT VTI:    0.252 m  AORTA Ao Root diam: 2.60 cm MITRAL VALVE MV Area (PHT): 3.08 cm     SHUNTS MV Decel Time: 246 msec     Systemic VTI:  0.25 m MV E velocity: 91.05 cm/s   Systemic Diam: 2.10 cm MV A velocity: 101.15 cm/s MV E/A ratio:  0.90 Fransico Him MD Electronically signed by Fransico Him MD Signature Date/Time: 11/23/2019/4:17:40 PM    Final      Labs:   Basic Metabolic Panel: Recent Labs  Lab 11/29/19 0802 11/30/19 0559 12/02/19 0703  NA 138  --  140  K 3.7  --  3.9  CL 102  --  104  CO2 28  --  29  GLUCOSE 139*  --  117*  BUN 21  --  22  CREATININE 1.01* 1.03* 1.19*  CALCIUM 8.5*  --  8.3*  GFR Estimated Creatinine Clearance: 35.6 mL/min (A) (by C-G formula based on SCr of 1.19 mg/dL (H)). Liver Function Tests: Recent Labs  Lab  12/02/19 0703  AST 9*  ALT 14  ALKPHOS 51  BILITOT 0.7  PROT 5.1*  ALBUMIN 2.7*   No results for input(s): LIPASE, AMYLASE in the last 168 hours. No results for input(s): AMMONIA in the last 168 hours. Coagulation profile No results for input(s): INR, PROTIME in the last 168 hours.  CBC: Recent Labs  Lab 11/29/19 0802 12/02/19 0703  WBC 5.8 10.4  NEUTROABS 4.5 8.5*  HGB 11.4* 10.8*  HCT 36.1 35.0*  MCV 95.5 96.7  PLT 309 288   Cardiac Enzymes: No results for input(s): CKTOTAL, CKMB, CKMBINDEX, TROPONINI in the last 168 hours. BNP: Invalid input(s): POCBNP CBG: Recent Labs  Lab 12/02/19 1103 12/02/19 1604 12/02/19 2237 12/03/19 0915 12/03/19 1203  GLUCAP 143* 184* 192* 114* 103*   D-Dimer No results for input(s): DDIMER in the last 72 hours. Hgb A1c No results for input(s): HGBA1C in the last 72 hours. Lipid Profile No results for input(s): CHOL, HDL, LDLCALC, TRIG, CHOLHDL, LDLDIRECT in the last 72 hours. Thyroid function studies No results for input(s): TSH, T4TOTAL, T3FREE, THYROIDAB in the last 72 hours.  Invalid input(s): FREET3 Anemia work up No results for input(s): VITAMINB12, FOLATE, FERRITIN, TIBC, IRON, RETICCTPCT in the last 72 hours. Microbiology Recent Results (from the past 240 hour(s))  SARS Coronavirus 2 by RT PCR (hospital order, performed in [facility] hospital lab) Nasopharyngeal Nasopharyngeal Swab     Status: None   Collection Time: 11/29/19 11:54 AM   Specimen: Nasopharyngeal Swab  Result Value Ref Range Status   SARS Coronavirus 2 NEGATIVE NEGATIVE Final    Comment: (NOTE) SARS-CoV-2 target nucleic acids are NOT DETECTED.  The SARS-CoV-2 RNA is generally detectable in upper and lower respiratory specimens during the acute phase of infection. The lowest concentration of SARS-CoV-2 viral copies this assay can detect is 250 copies / mL. A negative result does not preclude SARS-CoV-2 infection and should not be used as the sole  basis for treatment or other patient management decisions.  A negative result may occur with improper specimen collection / handling, submission of specimen other than nasopharyngeal swab, presence of viral mutation(s) within the areas targeted by this assay, and inadequate number of viral copies (<250 copies / mL). A negative result must be combined with clinical observations, patient history, and epidemiological information.  Fact Sheet for Patients:   StrictlyIdeas.no  Fact Sheet for Healthcare Providers: BankingDealers.co.za  This test is not yet approved or  cleared by the [place] FDA and has been authorized for detection and/or diagnosis of SARS-CoV-2 by FDA under an Emergency Use Authorization (EUA).  This EUA will remain in effect (meaning this test can be used) for the duration of the COVID-19 declaration under Section 564(b)(1) of the Act, 21 U.S.C. section 360bbb-3(b)(1), unless the authorization is terminated or revoked sooner.  Performed at [facility], [address] [place], [place] [address]    I have spent 38 minutes in the evaluation and discharge of this patient.  Signed: Ilyse Tremain  [facility] 12/03/2019, 2:06 PM

## 2019-12-03 NOTE — TOC Transition Note (Addendum)
Transition of Care [facility]) - CM/SW Discharge Note   Patient Details  Name: Christina Fry MRN: [id number] Date of Birth: Aug 23, 1940  [facility] CM/SW Contact:  Shade Flood, LCSW Phone Number: 12/03/2019, 3:24 PM   Clinical Narrative:     Pt stable for dc and VA contracted bed has been arranged at [facility] in [place]. Spoke with pt's daughter and she accepts the bed offer. Neoma Laming at [facility] states they can take pt today. DC clinical sent electronically. RN called report. EMS arranged for 1630 due to pending rapid covid test.  Sent secure email to [facility] rep, Tye [place], to update on chosen facility.  No other TOC needs identified for dc.  1600: Received return message from Briar [place] at the [place] stating that they cannot authorize pt to go to [facility] in [place] as the [place] contracted beds there are at capacity. Pt's daughter aware as she was at the SNF to do paperwork when the SNF was updated by the [place]. TOC working on follow up with the other facilities to determine if they will accept pt.   Cancelled EMS. Updated MD and RN. Will follow.  Final next level of care: Skilled Nursing Facility Barriers to Discharge: Barriers Resolved   Patient Goals and CMS Choice Patient states their goals for this hospitalization and ongoing recovery are:: Go to SNF CMS Medicare.gov Compare Post Acute Care list provided to:: Patient Represenative (must comment) [name] (daughter)) Choice offered to / list presented to : Adult Children  Discharge Placement   Existing PASRR number confirmed : 11/25/19          Patient chooses bed at: [facility] Patient to be transferred to facility by: EMS Name of family member notified: [name] Patient and family notified of of transfer: 12/03/19  Discharge Plan and Services     Post Acute Care Choice: [place]          DME Arranged: N/A DME Agency: NA       HH Arranged: NA HH Agency:  NA        Social Determinants of Health (SDOH) Interventions     Readmission Risk Interventions No flowsheet data found.

## 2019-12-03 NOTE — TOC Progression Note (Signed)
Transition of Care [facility]) - Progression Note    Patient Details  Name: Christina Fry MRN: [id number] Date of Birth: 1940-11-25  Transition of Care [facility]) CM/SW Contact  Shade Flood, LCSW Phone Number: 12/03/2019, 4:14 PM  Clinical Narrative:     TOC working on finding an alternative placement for pt SNF Rehab. Spoke with [place] who will follow up on [facility] referral. Faxed referral to Tammy at [facility] at [place] as well after speaking with Tammy and confirming that they do have VA bed availability. Will await update on determination.  Expected Discharge Plan: [place] Barriers to Discharge: Barriers Resolved  Expected Discharge Plan and Services Expected Discharge Plan: [place] Choice: [place] Living arrangements for the past 2 months: Single Family Home Expected Discharge Date: 12/03/19               DME Arranged: N/A DME Agency: NA       HH Arranged: NA HH Agency: NA         Social Determinants of Health (SDOH) Interventions    Readmission Risk Interventions No flowsheet data found.

## 2019-12-04 LAB — GLUCOSE, CAPILLARY
Glucose-Capillary: 91 mg/dL (ref 70–99)
Glucose-Capillary: 115 mg/dL — ABNORMAL HIGH (ref 70–99)
Glucose-Capillary: 160 mg/dL — ABNORMAL HIGH (ref 70–99)
Glucose-Capillary: 226 mg/dL — ABNORMAL HIGH (ref 70–99)

## 2019-12-04 MED ORDER — GLUCERNA SHAKE PO LIQD
237.0000 mL | Freq: Three times a day (TID) | ORAL | Status: DC
  Administered 2019-12-04 – 2019-12-10 (×12): 237 mL via ORAL

## 2019-12-04 MED ORDER — GLUCERNA SHAKE PO LIQD: 237.0000 mL | Freq: Three times a day (TID) | ORAL | 0 refills | Status: DC

## 2019-12-04 NOTE — Plan of Care (Signed)
  Problem: Education: Goal: Knowledge of General Education information will improve Description: Including pain rating scale, medication(s)/side effects and non-pharmacologic comfort measures 12/04/2019 1648 by Cameron Ali, RN Outcome: Progressing 12/04/2019 1648 by Cameron Ali, RN Outcome: Progressing   Problem: Health Behavior/Discharge Planning: Goal: Ability to manage health-related needs will improve 12/04/2019 1648 by Cameron Ali, RN Outcome: Progressing 12/04/2019 1648 by Cameron Ali, RN Outcome: Progressing   Problem: Clinical Measurements: Goal: Ability to maintain clinical measurements within normal limits will improve 12/04/2019 1648 by Cameron Ali, RN Outcome: Progressing 12/04/2019 1648 by Cameron Ali, RN Outcome: Progressing Goal: Will remain free from infection 12/04/2019 1648 by Cameron Ali, RN Outcome: Progressing 12/04/2019 1648 by Cameron Ali, RN Outcome: Progressing Goal: Diagnostic test results will improve 12/04/2019 1648 by Cameron Ali, RN Outcome: Progressing 12/04/2019 1648 by Cameron Ali, RN Outcome: Progressing Goal: Respiratory complications will improve 12/04/2019 1648 by Cameron Ali, RN Outcome: Progressing 12/04/2019 1648 by Cameron Ali, RN Outcome: Progressing Goal: Cardiovascular complication will be avoided 12/04/2019 1648 by Cameron Ali, RN Outcome: Progressing 12/04/2019 1648 by Cameron Ali, RN Outcome: Progressing   Problem: Activity: Goal: Risk for activity intolerance will decrease 12/04/2019 1648 by Cameron Ali, RN Outcome: Progressing 12/04/2019 1648 by Cameron Ali, RN Outcome: Progressing   Problem: Nutrition: Goal: Adequate nutrition will be maintained 12/04/2019 1648 by Cameron Ali, RN Outcome: Progressing 12/04/2019 1648 by Cameron Ali, RN Outcome: Progressing   Problem: Coping: Goal: Level of anxiety will decrease 12/04/2019  1648 by Cameron Ali, RN Outcome: Progressing 12/04/2019 1648 by Cameron Ali, RN Outcome: Progressing   Problem: Elimination: Goal: Will not experience complications related to bowel motility 12/04/2019 1648 by Cameron Ali, RN Outcome: Progressing 12/04/2019 1648 by Cameron Ali, RN Outcome: Progressing Goal: Will not experience complications related to urinary retention 12/04/2019 1648 by Cameron Ali, RN Outcome: Progressing 12/04/2019 1648 by Cameron Ali, RN Outcome: Progressing   Problem: Pain Managment: Goal: General experience of comfort will improve 12/04/2019 1648 by Cameron Ali, RN Outcome: Progressing 12/04/2019 1648 by Cameron Ali, RN Outcome: Progressing   Problem: Safety: Goal: Ability to remain free from injury will improve 12/04/2019 1648 by Cameron Ali, RN Outcome: Progressing 12/04/2019 1648 by Cameron Ali, RN Outcome: Progressing   Problem: Skin Integrity: Goal: Risk for impaired skin integrity will decrease 12/04/2019 1648 by Cameron Ali, RN Outcome: Progressing 12/04/2019 1648 by Cameron Ali, RN Outcome: Progressing

## 2019-12-04 NOTE — Care Management Important Message (Signed)
Important Message  Patient Details  Name: Christina Fry MRN: [id number] Date of Birth: Feb 09, 1941   Medicare Important Message Given:  Yes     Tommy Medal 12/04/2019, 12:55 PM

## 2019-12-04 NOTE — TOC Progression Note (Signed)
Transition of Care [facility]) - Progression Note    Patient Details  Name: SHELONDA SAXE MRN: [id number] Date of Birth: June 13, 1940  Transition of Care [facility]) CM/SW Contact  Shade Flood, LCSW Phone Number: 12/04/2019, 11:39 AM  Clinical Narrative:     TOC following. Have contacted all SNF facilities on the list provided to [facility] from the [place] has been declined by [facility] and [facility]. Decision is still pending at [facility] at [place], [facility], [facility] [place], [facility], [place], and [place]. Updated TOC supervisor and pt's daughter. Will follow.    Expected Discharge Plan: [place] Barriers to Discharge: Barriers Resolved  Expected Discharge Plan and Services Expected Discharge Plan: [place] Choice: [place] Living arrangements for the past 2 months: Single Family Home Expected Discharge Date: 12/03/19               DME Arranged: N/A DME Agency: NA       HH Arranged: NA HH Agency: NA         Social Determinants of Health (SDOH) Interventions    Readmission Risk Interventions No flowsheet data found.

## 2019-12-04 NOTE — Progress Notes (Signed)
PROGRESS NOTE  Christina Fry PYP:[id number] DOB: [birth year] DOA: 11/22/2019 PCP: Ferdie Ping, MD  Brief History   Christina Fry is a 79 y.o. female with PMH of polymyalgia rheumatica, fibromyalgia, GERD, hypertension, hypothyroidism. Patient presented to the ED on 11/22/2019 with progressively worsening generalized weakness, shakiness and multiple tender points in the body for last few days culminating to a fall on 9/10. At baseline, patient ambulates with a walker but she has been avoiding ambulating last few days in fear of falling.  She also complains of worsening pain and swelling of both lower extremities and also complains of body pain and back pain even on coughing.  Since the night before, patient also has blurring of vision on the left eye.She has a history of polymyalgia rheumatica with an acute flare of 10 years ago, ended up in an LTAC for intensive rehab.  She improved with prednisone and had to take it for 3 to 4 years and gradually tapered off it. [facility]  In the ED, patient was hemodynamically stable. Labs unremarkable. CT scan of head, cervical spine, thoracic spine, lumbar spine did not show any acute abnormality.  Chronic T5 compression fracture was noted. Chest x-ray showed emphysematous and chronic bronchitis changes in the lungs with no evidence of active pulmonary disease. Patient was admitted under hospitalist service for further evaluation management. She has received high dose steroids and pain control. She has improved somewhat although mobility and pain continue to be issues.  She is awaiting SNF placement.  Consultants  . Neurology  Procedures  . None  Antibiotics   Anti-infectives (From admission, onward)   None     Subjective  The patient is resting comfortably. She states that fentanyl patch has helped her pain.  Objective   Vitals:  Vitals:   12/04/19 0600 12/04/19 1326  BP: 131/76 (!) 152/67  Pulse: 65 75  Resp: 16 18  Temp:  98.4 F (36.9 C) 99 F (37.2 C)  SpO2: 98% 100%    Exam:  Constitutional:  The patient is awake, alert, and oriented x 3. No acute distress. Respiratory:  . No increased work of breathing. . No wheezes, rales, or rhonchi . No tactile fremitus Cardiovascular:  . Regular rate and rhythm . No murmurs, ectopy, or gallups. . No lateral PMI. No thrills. Abdomen:  . Abdomen is soft, non-tender, non-distended . No hernias, masses, or organomegaly . Normoactive bowel sounds.  Musculoskeletal:  . No cyanosis, clubbing, or edema Skin:  . No rashes, lesions, ulcers . palpation of skin: no induration or nodules Neurologic:  . CN 2-12 intact . Sensation all 4 extremities intact Psychiatric:  . Mental status o Mood, affect appropriate o Orientation to person, place, time  . judgment and insight appear intact  I have personally reviewed the following:   Today's Data  . Vitals, Creatinine  Scheduled Meds: .  stroke: mapping our early stages of recovery book   Does not apply Once  . aspirin  81 mg Oral Daily  . atorvastatin  20 mg Oral QHS  . calcium-vitamin D  1 tablet Oral Q breakfast  . cholecalciferol  400 Units Oral Daily  . docusate sodium  100 mg Oral BID  . enoxaparin (LOVENOX) injection  40 mg Subcutaneous Q24H  . fentaNYL  1 patch Transdermal Q72H  . fluticasone  1 spray Each Nare BID  . gabapentin  300 mg Oral TID  . HYDROcodone-acetaminophen  1 tablet Oral Q8H  . insulin  aspart  0-5 Units Subcutaneous QHS  . insulin aspart  0-9 Units Subcutaneous TID WC  . lactulose  10 g Oral q AM  . levothyroxine  88 mcg Oral QAC breakfast  . loratadine  10 mg Oral Daily  . metoprolol tartrate  25 mg Oral BID  . nortriptyline  25 mg Oral QHS  . pantoprazole  40 mg Oral Daily  . predniSONE  40 mg Oral BID WC   Continuous Infusions:   Principal Problem:   Weakness of both lower extremities Active Problems:   Generalized weakness   PMR (polymyalgia rheumatica) (HCC)    GERD (gastroesophageal reflux disease)   Fibromyalgia   Essential hypertension   Hypothyroidism   Ambulatory dysfunction   Accident due to mechanical fall without injury   TIA (transient ischemic attack)   LOS: 11 days   A & P  Acute flareup of polymyalgia rheumatica: Presented with few days of progressively worsening bilateral lower extremities pain, generalized weakness, shakiness blurring of vision and a fall.  Able to walk sideways but not forwards. She has a history of polymyalgia rheumatica with chronic bilateral lower extremity pain.  She last had a flare up of 10 years ago, ended up in an LTAC for intensive rehab.  She improved with prednisone and had to take it for 3 to 4 years and gradually tapered off it. Neurology consultation obtained.  Patient needs currently on high-dose of Solu-Medrol 500 mg IV daily. Have reduced steroids to prednisone 40 mg bid as was recommended for discharge dose. For pain control, she is scheduled Norco 5 mg every 8 hours. We will change it to as needed once pain controlled. Continue increased dose of gabapentin 300 mg 3 times daily. Continue to monitor mental status. I have added 12 mcg fentanyl patch for more consistent pain control. The patient is improved.  Stroke ruled out: Normal MRI brain. Continue primary prophylaxis with patient is on aspirin 81 mg daily and statin.  Potential need of long-term steroids: According to patient's daughter, patient was on prednisone for about 3 years after her last flareup.  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 There is chance patient will be reinitiated on long course of steroids at this time as well. She has been reduced to prednisone 40 mg bid. Currently I have her on Protonix and sliding scale insulin with Accu-Cheks.  Bilateral lower extremity swelling and pain: Suspect secondary to PMR.  Ultrasound duplex of lower extremities ruled  out DVT.  DM II: FSBS has been 108-143 in the last 24 hours. Continue to follow FSBS and SSI.  Fibromyalgia: Continue gabapentin and nortriptyline.  Essential hypertension: Continue Lopressor per home regimen  Hypothyroidism: Continue Synthroid per home regimen. Will recheck TSH T3, and T4.  GERD: Continue Protonix  I have seen and examined this patient myself. I have spent 35 minutes in her evaluation and care.  Code Status:  Code Status: Full Code  DVT prophylaxis: enoxaparin (LOVENOX) injection 40 mg Start: 11/23/19 1000 SCDs Start: 11/23/19 0323 Family Communication:None available Status is: Inpatient  Remains inpatient appropriate because currently remains on high-dose IV steroids continues to have significant amount of pain and weakness.  Dispo: The patient is from: Home  Anticipated d/c is to: SNF  Anticipated d/c date is: 2 days  Patient currently is not medically stable to d/c.  Christina Salemi, DO [facility] Direct contact: see [email or web address]  7PM-7AM contact night coverage  as above 12/04/2019, 2:06 PM  LOS: 7 days

## 2019-12-04 NOTE — Progress Notes (Addendum)
Initial Nutrition Assessment  RD working remotely  INTERVENTION:  Glucerna Shake po TID, each supplement provides 220 kcal and 10 grams of protein   Recommend liberalize diet to regular  NUTRITION DIAGNOSIS:   Inadequate oral intake related to acute illness, chronic illness (acute flare of chronic polymyalgia rheumatica) as evidenced by energy intake < 75% for > 7 days.   GOAL:   Patient will meet greater than or equal to 90% of their needs  MONITOR:   PO intake, Supplement acceptance, Weight trends, Labs  REASON FOR ASSESSMENT:   LOS    ASSESSMENT:  Patient is an 79 yo female with hx of Grave's dz, GERD, Fibromyalgia, DM,  HLD, Polymyalgia rheumatica.   Per chart review: Meal intake 25-75% most day. Feeds herself. At risk for malnutrition given her variable intake and  inflammatory disease.  Weight stable between 73-75 kg the past 11 months.  Medications reviewed and include: Oscal, D3, Colace, Fentanyl patch, Hydrocodone, insulin, lactulose, protonix.  Labs: BMP Latest Ref Rng & Units 12/02/2019 11/30/2019 11/29/2019  Glucose 70 - 99 mg/dL 117(H) - 139(H)  BUN 8 - 23 mg/dL 22 - 21  Creatinine 0.44 - 1.00 mg/dL 1.19(H) 1.03(H) 1.01(H)  Sodium 135 - 145 mmol/L 140 - 138  Potassium 3.5 - 5.1 mmol/L 3.9 - 3.7  Chloride 98 - 111 mmol/L 104 - 102  CO2 22 - 32 mmol/L 29 - 28  Calcium 8.9 - 10.3 mg/dL 8.3(L) - 8.5(L)    NUTRITION - FOCUSED PHYSICAL EXAM:  Unable to complete Nutrition-Focused physical exam at this time.  RD working remotely.  Diet Order:   Diet Order            Diet - low sodium heart healthy           Diet Carb Modified           Diet Heart Room service appropriate? Yes; Fluid consistency: Thin  Diet effective now                 EDUCATION NEEDS:   No education needs have been identified at this time  Skin:  Skin Assessment: Reviewed RN Assessment  Last BM:  9/20  Height:   Ht Readings from Last 1 Encounters:  11/22/19 5\' 3"  (1.6 m)     Weight:   Wt Readings from Last 1 Encounters:  12/03/19 73.4 kg    Ideal Body Weight:   52 kg  BMI:  Body mass index is 28.66 kg/m.  Estimated Nutritional Needs:   Kcal:  1550-1700  Protein:  85-90  Fluid:  1.6-1.7 liters daily  Colman Cater MS,RD,CSG,LDN Pager: Shea Evans

## 2019-12-05 LAB — GLUCOSE, CAPILLARY
Glucose-Capillary: 104 mg/dL — ABNORMAL HIGH (ref 70–99)
Glucose-Capillary: 134 mg/dL — ABNORMAL HIGH (ref 70–99)
Glucose-Capillary: 147 mg/dL — ABNORMAL HIGH (ref 70–99)
Glucose-Capillary: 217 mg/dL — ABNORMAL HIGH (ref 70–99)

## 2019-12-05 NOTE — TOC Progression Note (Signed)
Transition of Care [facility]) - Progression Note    Patient Details  Name: MEHAK ROSKELLEY MRN: [id number] Date of Birth: 07-May-1940  Transition of Care [facility] CM/SW Contact  Shade Flood, LCSW Phone Number: 12/05/2019, 2:02 PM  Clinical Narrative:     TOC following. Continuing to wait on decision from [place] contracted SNFs. Asked PT for an updated progress note as several facilities asking for updated notes.   Spoke with pt's daughter to update. Will follow.  Expected Discharge Plan: Skilled Nursing Facility Barriers to Discharge: SNF Pending bed offer  Expected Discharge Plan and Services Expected Discharge Plan: [place] Choice: [place] arrangements for the past 2 months: Single Family Home Expected Discharge Date: 12/03/19               DME Arranged: N/A DME Agency: NA       HH Arranged: NA HH Agency: NA         Social Determinants of Health (SDOH) Interventions    Readmission Risk Interventions No flowsheet data found.

## 2019-12-05 NOTE — Progress Notes (Signed)
Physical Therapy Treatment Patient Details Name: Christina Fry MRN: [id number] DOB: 1940-03-24 Today's Date: 12/05/2019    History of Present Illness 79 y.o. female with medical history significant for PMR, fibromyalgia, GERD, hypertension, hypothyroidism who presents to the emergency department due to generalized weakness and fall sustained at home.  She complained of 2-day onset of sudden increase in shaking episodes, this was associated with generalized tender points in her back and lower extremities.  Patient ambulates with a walker at baseline, and she complained of difficulty in being able to ambulate within last 2 days, an attempt to ambulate resulted in her fall due to difficulty in being able to lift her feet from the ground due to weakness.  She went to an urgent care, but since the result for blood work would not be available for 2-4 days, [facility], but decided to go to ED for further evaluation due to the fall.Patient endorsed recent severe coughing fit with resultant increased back pain, however, this would not explain the shakes in her arms and legs.  She states that she had similar presentation several years ago and ended up being in [place] for intensive rehab, prior to being able to regain ability to ambulate.  Patient did complain of blurry vision last night when she was going to text her daughter and she ended up dictating her text.    PT Comments    Patient demonstrates improvement for sitting up at bedside, once seated patient unable to extend knees or flex hips against gravity due to weakness requiring active assistance to complete LAQ's and hip raises, unable to take forward steps, but can side step at bedside while leaning on armrest of chair.  Patient limited secondary to fatigue and requested to go back to bed.  Patient will benefit from continued physical therapy in hospital and recommended venue below to increase strength, balance, endurance for  safe ADLs and gait.   Follow Up Recommendations  SNF     Equipment Recommendations  None recommended by PT    Recommendations for Other Services       Precautions / Restrictions Precautions Precautions: Fall    Mobility  Bed Mobility Overal bed mobility: Needs Assistance Bed Mobility: Supine to Sit;Sit to Supine     Supine to sit: Min guard Sit to supine: Min assist   General bed mobility comments: requires assistance to move legs during sit to supine  Transfers Overall transfer level: Needs assistance Equipment used: 1 person hand held assist (leaning on armrest of chair) Transfers: Sit to/from Stand;Stand Pivot Transfers Sit to Stand: Min guard;Supervision Stand pivot transfers: Min guard       General transfer comment: declined to use RW due to not able to step forward due to weakness, had to lean on armrests for chair to stand  Ambulation/Gait Ambulation/Gait assistance: Min assist Gait Distance (Feet): 4 Feet Assistive device: 1 person hand held assist Gait Pattern/deviations: Decreased step length - right;Decreased step length - left;Decreased stride length Gait velocity: decreased   General Gait Details: limited to 4-5 short unsteady side steps at bedside due to bilateral quad weaknees with inability to extend knees against gravity   Stairs             Wheelchair Mobility    Modified Rankin (Stroke Patients Only)       Balance Overall balance assessment: Needs assistance Sitting-balance support: Feet supported;No upper extremity supported Sitting balance-Leahy Scale: Good Sitting balance - Comments: seated  EOB   Standing balance support: During functional activity;Bilateral upper extremity supported;No upper extremity supported Standing balance-Leahy Scale: Poor Standing balance comment: fair leaning on armrest of chair                            Cognition Arousal/Alertness: Awake/alert Behavior During Therapy: WFL for  tasks assessed/performed Overall Cognitive Status: Within Functional Limits for tasks assessed                                        Exercises General Exercises - Lower Extremity Ankle Circles/Pumps: Seated;AROM;Strengthening;Both;10 reps Long Arc Quad: Seated;AAROM;Both;10 reps;Strengthening Hip Flexion/Marching: Seated;AAROM;Strengthening;Both;10 reps    General Comments        Pertinent Vitals/Pain Pain Assessment: Faces Faces Pain Scale: Hurts a little bit Pain Location: bilateral hands Pain Descriptors / Indicators: Aching;Sore Pain Intervention(s): Limited activity within patient's tolerance;Monitored during session    Home Living                      Prior Function            PT Goals (current goals can now be found in the care plan section) Acute Rehab PT Goals Patient Stated Goal: return home after rehab PT Goal Formulation: With patient Time For Goal Achievement: 12/07/19 Potential to Achieve Goals: Good Progress towards PT goals: Progressing toward goals    Frequency    Min 3X/week      PT Plan Current plan remains appropriate    Co-evaluation              AM-PAC PT "6 Clicks" Mobility   Outcome Measure  Help needed turning from your back to your side while in a flat bed without using bedrails?: A Little Help needed moving from lying on your back to sitting on the side of a flat bed without using bedrails?: A Little Help needed moving to and from a bed to a chair (including a wheelchair)?: A Lot Help needed standing up from a chair using your arms (e.g., wheelchair or bedside chair)?: A Lot Help needed to walk in hospital room?: A Lot Help needed climbing 3-5 steps with a railing? : Total 6 Click Score: 13    End of Session   Activity Tolerance: Patient tolerated treatment well;Patient limited by fatigue Patient left: in bed;with call bell/phone within reach Nurse Communication: Mobility status PT Visit  Diagnosis: Unsteadiness on feet (R26.81);Other abnormalities of gait and mobility (R26.89);Muscle weakness (generalized) (M62.81)     Time: 7893-8101 PT Time Calculation (min) (ACUTE ONLY): 25 min  Charges:  $Therapeutic Exercise: 8-22 mins $Therapeutic Activity: 8-22 mins                     3:27 PM, 12/05/19 Lonell Grandchild, MPT Physical Therapist with [facility] [phone] office [phone] mobile phone

## 2019-12-05 NOTE — Progress Notes (Signed)
PROGRESS NOTE    Christina Fry  ZHG:[id number] DOB: 1940/12/16 DOA: 11/22/2019 PCP: Ferdie Ping, MD   Brief Narrative:  Patient is 79 year old female with history of polymyalgia rheumatica, fibromyalgia, GERD, hypertension, hypothyroidism who presents to the emergency department on 11/22/2019 with progressive worsening generalized weakness, shakiness, multiple tender joints, falls at home.  She ambulates with the help of walker, scooter at home.  She complains of pain everywhere.  On presentation she was hemodynamically stable.  Extensive imagings done after admission did not show any acute abnormalities.  She has chronic T5 compression fracture.  Patient was managed for polymyalgia rheumatica and was started on steroids.  Currently waiting for risk nursing facility placement.  She is hemodynamically stable for discharge to skilled nursing facility as soon as bed is available.  Assessment & Plan:   Principal Problem:   Weakness of both lower extremities Active Problems:   Generalized weakness   PMR (polymyalgia rheumatica) (HCC)   GERD (gastroesophageal reflux disease)   Fibromyalgia   Essential hypertension   Hypothyroidism   Ambulatory dysfunction   Accident due to mechanical fall without injury   TIA (transient ischemic attack)   Acute flareup of PMR: Significantly debilitated and deconditioned due to this problem.  Has a long history and was admitted in the rehab in the past for extensive physical therapy.  Presented with bilateral lower extremities pain, generalized weakness, blurry vision, fall.  She had improved with prednisone in the past but had stopped taking it recently.  Neurology was consulted also during this admission. Patient was treated with high-dose steroids IV but has been changed to 40 mg prednisone twice a day for now.  Also on pain medications, gabapentin. PT/OT recommended skilled facility on discharge.  Weakness of bilateral lower extremities/blurry vision:  Initially stroke was suspected but MRI did not show any acute intracranial abnormalities.  Currently on aspirin and statin.  Ultrasound of the bilateral lower extremity did not show any DVT.  Diabetes type 2: Watch for hyperglycemia in the setting of prednisone use.  Continue sliding-scale insulin.  Fibromyalgia: Continue supportive care.  On gabapentin, nortriptyline  Hypertension: Currently blood pressure stable.  Continue current regimen  Hypothyroidism: Continue Synthyroid  GERD: Continue Protonix  Debility/deconditioning: PT/OT recommended skilled nursing facility.  Nutrition Problem: Inadequate oral intake Etiology: acute illness, chronic illness (acute flare of chronic polymyalgia rheumatica)      DVT prophylaxis:Lovenox Code Status: Full Family Communication: None at the bedside Status is: Inpatient  Remains inpatient appropriate because:Unsafe d/c plan   Dispo: The patient is from: Home              Anticipated d/c is to: SNF              Anticipated d/c date is: 2 days              Patient currently is medically stable to d/c.     Consultants: None  Procedures:None  Antimicrobials:  Anti-infectives (From admission, onward)   None      Subjective: Patient seen and examined at the bedside this morning.  Hemodynamically stable.  Comfortable.  Complains of pain on her extremities and difficulty walking.  Anticipating discharge to skilled nursing facility.  Objective: Vitals:   12/04/19 0600 12/04/19 1326 12/04/19 2209 12/05/19 0625  BP: 131/76 (!) 152/67 (!) 174/63 (!) 152/68  Pulse: 65 75 76 66  Resp: 16 18 16 18   Temp: 98.4 F (36.9 C) 99 F (37.2 C) (!) 97.3 F (36.3 C)  98.2 F (36.8 C)  TempSrc:    Oral  SpO2: 98% 100% 97% 97%  Weight:      Height:        Intake/Output Summary (Last 24 hours) at 12/05/2019 1210 Last data filed at 12/04/2019 1700 Gross per 24 hour  Intake 240 ml  Output --  Net 240 ml   Filed Weights   11/22/19 1842  12/03/19 0523  Weight: 73 kg 73.4 kg    Examination:  General exam: Pleasant elderly female, deconditioned, debilitated HEENT:PERRL,Oral mucosa moist, Ear/Nose normal on gross exam Respiratory system: Bilateral equal air entry, normal vesicular breath sounds, no wheezes or crackles  Cardiovascular system: S1 & S2 heard, RRR. No JVD, murmurs, rubs, gallops or clicks. No pedal edema. Gastrointestinal system: Abdomen is nondistended, soft and nontender. No organomegaly or masses felt. Normal bowel sounds heard. Central nervous system: Alert and oriented. No focal neurological deficits. Extremities: No edema, no clubbing ,no cyanosis. Skin: No rashes, lesions or ulcers,no icterus ,no pallor    Data Reviewed: I have personally reviewed following labs and imaging studies  CBC: Recent Labs  Lab 11/29/19 0802 12/02/19 0703  WBC 5.8 10.4  NEUTROABS 4.5 8.5*  HGB 11.4* 10.8*  HCT 36.1 35.0*  MCV 95.5 96.7  PLT 309 093   Basic Metabolic Panel: Recent Labs  Lab 11/29/19 0802 11/30/19 0559 12/02/19 0703  NA 138  --  140  K 3.7  --  3.9  CL 102  --  104  CO2 28  --  29  GLUCOSE 139*  --  117*  BUN 21  --  22  CREATININE 1.01* 1.03* 1.19*  CALCIUM 8.5*  --  8.3*   GFR: Estimated Creatinine Clearance: 35.6 mL/min (A) (by C-G formula based on SCr of 1.19 mg/dL (H)). Liver Function Tests: Recent Labs  Lab 12/02/19 0703  AST 9*  ALT 14  ALKPHOS 51  BILITOT 0.7  PROT 5.1*  ALBUMIN 2.7*   No results for input(s): LIPASE, AMYLASE in the last 168 hours. No results for input(s): AMMONIA in the last 168 hours. Coagulation Profile: No results for input(s): INR, PROTIME in the last 168 hours. Cardiac Enzymes: No results for input(s): CKTOTAL, CKMB, CKMBINDEX, TROPONINI in the last 168 hours. BNP (last 3 results) No results for input(s): PROBNP in the last 8760 hours. HbA1C: No results for input(s): HGBA1C in the last 72 hours. CBG: Recent Labs  Lab 12/04/19 1105  12/04/19 1610 12/04/19 2210 12/05/19 0727 12/05/19 1106  GLUCAP 115* 160* 226* 104* 134*   Lipid Profile: No results for input(s): CHOL, HDL, LDLCALC, TRIG, CHOLHDL, LDLDIRECT in the last 72 hours. Thyroid Function Tests: No results for input(s): TSH, T4TOTAL, FREET4, T3FREE, THYROIDAB in the last 72 hours. Anemia Panel: No results for input(s): VITAMINB12, FOLATE, FERRITIN, TIBC, IRON, RETICCTPCT in the last 72 hours. Sepsis Labs: No results for input(s): PROCALCITON, LATICACIDVEN in the last 168 hours.  Recent Results (from the past 240 hour(s))  SARS Coronavirus 2 by RT PCR (hospital order, performed in [facility] hospital lab) Nasopharyngeal Nasopharyngeal Swab     Status: None   Collection Time: 11/29/19 11:54 AM   Specimen: Nasopharyngeal Swab  Result Value Ref Range Status   SARS Coronavirus 2 NEGATIVE NEGATIVE Final    Comment: (NOTE) SARS-CoV-2 target nucleic acids are NOT DETECTED.  The SARS-CoV-2 RNA is generally detectable in upper and lower respiratory specimens during the acute phase of infection. The lowest concentration of SARS-CoV-2 viral copies this assay can detect  is 250 copies / mL. A negative result does not preclude SARS-CoV-2 infection and should not be used as the sole basis for treatment or other patient management decisions.  A negative result may occur with improper specimen collection / handling, submission of specimen other than nasopharyngeal swab, presence of viral mutation(s) within the areas targeted by this assay, and inadequate number of viral copies (<250 copies / mL). A negative result must be combined with clinical observations, patient history, and epidemiological information.  Fact Sheet for Patients:   StrictlyIdeas.no  Fact Sheet for Healthcare Providers: BankingDealers.co.za  This test is not yet approved or  cleared by the [place] FDA and has been authorized for detection  and/or diagnosis of SARS-CoV-2 by FDA under an Emergency Use Authorization (EUA).  This EUA will remain in effect (meaning this test can be used) for the duration of the COVID-19 declaration under Section 564(b)(1) of the Act, 21 U.S.C. section 360bbb-3(b)(1), unless the authorization is terminated or revoked sooner.  Performed at [facility], [address] [place], [place] [address]   SARS Coronavirus 2 by RT PCR (hospital order, performed in [facility] hospital lab) Nasopharyngeal Nasopharyngeal Swab     Status: None   Collection Time: 12/03/19  1:33 PM   Specimen: Nasopharyngeal Swab  Result Value Ref Range Status   SARS Coronavirus 2 NEGATIVE NEGATIVE Final    Comment: (NOTE) SARS-CoV-2 target nucleic acids are NOT DETECTED.  The SARS-CoV-2 RNA is generally detectable in upper and lower respiratory specimens during the acute phase of infection. The lowest concentration of SARS-CoV-2 viral copies this assay can detect is 250 copies / mL. A negative result does not preclude SARS-CoV-2 infection and should not be used as the sole basis for treatment or other patient management decisions.  A negative result may occur with improper specimen collection / handling, submission of specimen other than nasopharyngeal swab, presence of viral mutation(s) within the areas targeted by this assay, and inadequate number of viral copies (<250 copies / mL). A negative result must be combined with clinical observations, patient history, and epidemiological information.  Fact Sheet for Patients:   StrictlyIdeas.no  Fact Sheet for Healthcare Providers: BankingDealers.co.za  This test is not yet approved or  cleared by the [place] FDA and has been authorized for detection and/or diagnosis of SARS-CoV-2 by FDA under an Emergency Use Authorization (EUA).  This EUA will remain in effect (meaning this test can be used) for the duration of  the COVID-19 declaration under Section 564(b)(1) of the Act, 21 U.S.C. section 360bbb-3(b)(1), unless the authorization is terminated or revoked sooner.  Performed at [facility], [address] [place], [place] [address]          Radiology Studies: No results found.      Scheduled Meds: .  stroke: mapping our early stages of recovery book   Does not apply Once  . aspirin  81 mg Oral Daily  . atorvastatin  20 mg Oral QHS  . calcium-vitamin D  1 tablet Oral Q breakfast  . cholecalciferol  400 Units Oral Daily  . docusate sodium  100 mg Oral BID  . enoxaparin (LOVENOX) injection  40 mg Subcutaneous Q24H  . feeding supplement (GLUCERNA SHAKE)  237 mL Oral TID BM  . fentaNYL  1 patch Transdermal Q72H  . fluticasone  1 spray Each Nare BID  . gabapentin  300 mg Oral TID  . HYDROcodone-acetaminophen  1 tablet Oral Q8H  . insulin aspart  0-5 Units  Subcutaneous QHS  . insulin aspart  0-9 Units Subcutaneous TID WC  . lactulose  10 g Oral q AM  . levothyroxine  88 mcg Oral QAC breakfast  . loratadine  10 mg Oral Daily  . metoprolol tartrate  25 mg Oral BID  . nortriptyline  25 mg Oral QHS  . pantoprazole  40 mg Oral Daily  . predniSONE  40 mg Oral BID WC   Continuous Infusions:   LOS: 12 days    Time spent: 25 mins,More than 50% of that time was spent in counseling and/or coordination of care.      Shelly Coss, MD [facility] P9/23/2021, 12:10 PM

## 2019-12-06 LAB — GLUCOSE, CAPILLARY
Glucose-Capillary: 88 mg/dL (ref 70–99)
Glucose-Capillary: 141 mg/dL — ABNORMAL HIGH (ref 70–99)
Glucose-Capillary: 205 mg/dL — ABNORMAL HIGH (ref 70–99)
Glucose-Capillary: 176 mg/dL — ABNORMAL HIGH (ref 70–99)

## 2019-12-06 LAB — CBC WITH DIFFERENTIAL/PLATELET
Abs Immature Granulocytes: 0.56 10*3/uL — ABNORMAL HIGH (ref 0.00–0.07)
Basophils Absolute: 0.1 10*3/uL (ref 0.0–0.1)
Basophils Relative: 0 %
Eosinophils Absolute: 0 10*3/uL (ref 0.0–0.5)
Eosinophils Relative: 0 %
HCT: 42.2 % (ref 36.0–46.0)
Hemoglobin: 13.2 g/dL (ref 12.0–15.0)
Immature Granulocytes: 4 %
Lymphocytes Relative: 14 %
Lymphs Abs: 2.2 10*3/uL (ref 0.7–4.0)
MCH: 30 pg (ref 26.0–34.0)
MCHC: 31.3 g/dL (ref 30.0–36.0)
MCV: 95.9 fL (ref 80.0–100.0)
Monocytes Absolute: 1.1 10*3/uL — ABNORMAL HIGH (ref 0.1–1.0)
Monocytes Relative: 7 %
Neutro Abs: 12.1 10*3/uL — ABNORMAL HIGH (ref 1.7–7.7)
Neutrophils Relative %: 75 %
Platelets: 337 10*3/uL (ref 150–400)
RBC: 4.4 MIL/uL (ref 3.87–5.11)
RDW: 14.2 % (ref 11.5–15.5)
WBC: 16 10*3/uL — ABNORMAL HIGH (ref 4.0–10.5)
nRBC: 0 % (ref 0.0–0.2)

## 2019-12-06 LAB — BASIC METABOLIC PANEL
Anion gap: 9 (ref 5–15)
BUN: 27 mg/dL — ABNORMAL HIGH (ref 8–23)
CO2: 29 mmol/L (ref 22–32)
Calcium: 9.3 mg/dL (ref 8.9–10.3)
Chloride: 99 mmol/L (ref 98–111)
Creatinine, Ser: 1.16 mg/dL — ABNORMAL HIGH (ref 0.44–1.00)
GFR calc Af Amer: 51 mL/min — ABNORMAL LOW (ref >60)
GFR calc non Af Amer: 44 mL/min — ABNORMAL LOW (ref >60)
Glucose, Bld: 115 mg/dL — ABNORMAL HIGH (ref 70–99)
Potassium: 3.9 mmol/L (ref 3.5–5.1)
Sodium: 137 mmol/L (ref 135–145)

## 2019-12-06 NOTE — Progress Notes (Addendum)
PROGRESS NOTE    Christina Fry  GYI:[id number] DOB: 03-31-1940 DOA: 11/22/2019 PCP: Ferdie Ping, MD   Brief Narrative:  Patient is 79 year old female with history of polymyalgia rheumatica, fibromyalgia, GERD, hypertension, hypothyroidism who presents to the emergency department on 11/22/2019 with progressive worsening generalized weakness, shakiness, multiple tender joints, falls at home.  She ambulates with the help of walker, scooter at home.  She complains of pain everywhere.  On presentation she was hemodynamically stable.  Extensive imagings done after admission did not show any acute abnormalities.  She has chronic T5 compression fracture.  Patient was managed for polymyalgia rheumatica and was started on steroids.  Currently waiting for risk nursing facility placement.  She is hemodynamically stable for discharge to skilled nursing facility as soon as bed is available.  Assessment & Plan:   Principal Problem:   Weakness of both lower extremities Active Problems:   Generalized weakness   PMR (polymyalgia rheumatica) (HCC)   GERD (gastroesophageal reflux disease)   Fibromyalgia   Essential hypertension   Hypothyroidism   Ambulatory dysfunction   Accident due to mechanical fall without injury   TIA (transient ischemic attack)   Acute flareup of PMR: Significantly debilitated and deconditioned due to this problem.  Has a long history and was admitted in the rehab in the past for extensive physical therapy.  Presented with bilateral lower extremities pain, generalized weakness, blurry vision, fall.  She had improved with prednisone in the past but had stopped taking it recently.  Neurology was consulted also during this admission. Patient was treated with high-dose steroids IV but has been changed to 40 mg prednisone twice a day for now.  Also on pain medications, gabapentin. PT/OT recommended skilled facility on discharge.  Weakness of bilateral lower extremities/blurry vision:  Initially stroke was suspected but MRI did not show any acute intracranial abnormalities.  Currently on aspirin and statin.  Ultrasound of the bilateral lower extremity did not show any DVT.  Diabetes type 2: Watch for hyperglycemia in the setting of prednisone use.  Continue sliding-scale insulin.  Fibromyalgia: Continue supportive care.  On gabapentin, nortriptyline  Hypertension: Currently blood pressure stable.  Continue current regimen  Hypothyroidism: Continue Synthyroid  GERD: Continue Protonix  Debility/deconditioning: PT/OT recommended skilled nursing facility.  She has a bed availability in a skilled nursing facility on 12/10/2019.  Please order Covid screening test on 12/09/2019  Nutrition Problem: Inadequate oral intake Etiology: acute illness, chronic illness (acute flare of chronic polymyalgia rheumatica)      DVT prophylaxis:Lovenox Code Status: Full Family Communication: None at the bedside Status is: Inpatient  Remains inpatient appropriate because:Unsafe d/c plan   Dispo: The patient is from: Home              Anticipated d/c is to: SNF              Anticipated d/c date is: 2 days              Patient currently is medically stable to d/c.     Consultants: None  Procedures:None  Antimicrobials:  Anti-infectives (From admission, onward)   None      Subjective: Patient seen and examined the bedside this morning. hemodynamically stable.  Comfortable.  No active issues.  Waiting for nursing facility bed.  Objective: Vitals:   12/04/19 2209 12/05/19 0625 12/05/19 2200 12/06/19 0532  BP: (!) 174/63 (!) 152/68 (!) 161/70 (!) 164/67  Pulse: 76 66 73 64  Resp: 16 18 18 16   Temp: Marland Kitchen)  97.3 F (36.3 C) 98.2 F (36.8 C) 98.8 F (37.1 C) 98.4 F (36.9 C)  TempSrc:  Oral Oral Oral  SpO2: 97% 97% 100% 97%  Weight:      Height:       No intake or output data in the 24 hours ending 12/06/19 0754 Filed Weights   11/22/19 1842 12/03/19 0523  Weight: 73  kg 73.4 kg    Examination:  General exam: Appears calm and comfortable , deconditioned, debilitated, pleasant elderly female HEENT:PERRL,Oral mucosa moist, Ear/Nose normal on gross exam Respiratory system: Bilateral equal air entry, normal vesicular breath sounds, no wheezes or crackles  Cardiovascular system: S1 & S2 heard, RRR. No JVD, murmurs, rubs, gallops or clicks. Gastrointestinal system: Abdomen is nondistended, soft and nontender. No organomegaly or masses felt. Normal bowel sounds heard. Central nervous system: Alert and oriented. No focal neurological deficits. Extremities: No edema, no clubbing ,no cyanosis, distal peripheral pulses palpable. Skin: No rashes, lesions or ulcers,no icterus ,no pallor     Data Reviewed: I have personally reviewed following labs and imaging studies  CBC: Recent Labs  Lab 11/29/19 0802 12/02/19 0703 12/06/19 0648  WBC 5.8 10.4 16.0*  NEUTROABS 4.5 8.5* 12.1*  HGB 11.4* 10.8* 13.2  HCT 36.1 35.0* 42.2  MCV 95.5 96.7 95.9  PLT 309 288 814   Basic Metabolic Panel: Recent Labs  Lab 11/29/19 0802 11/30/19 0559 12/02/19 0703  NA 138  --  140  K 3.7  --  3.9  CL 102  --  104  CO2 28  --  29  GLUCOSE 139*  --  117*  BUN 21  --  22  CREATININE 1.01* 1.03* 1.19*  CALCIUM 8.5*  --  8.3*   GFR: Estimated Creatinine Clearance: 35.6 mL/min (A) (by C-G formula based on SCr of 1.19 mg/dL (H)). Liver Function Tests: Recent Labs  Lab 12/02/19 0703  AST 9*  ALT 14  ALKPHOS 51  BILITOT 0.7  PROT 5.1*  ALBUMIN 2.7*   No results for input(s): LIPASE, AMYLASE in the last 168 hours. No results for input(s): AMMONIA in the last 168 hours. Coagulation Profile: No results for input(s): INR, PROTIME in the last 168 hours. Cardiac Enzymes: No results for input(s): CKTOTAL, CKMB, CKMBINDEX, TROPONINI in the last 168 hours. BNP (last 3 results) No results for input(s): PROBNP in the last 8760 hours. HbA1C: No results for input(s): HGBA1C  in the last 72 hours. CBG: Recent Labs  Lab 12/04/19 2210 12/05/19 0727 12/05/19 1106 12/05/19 1622 12/05/19 2209  GLUCAP 226* 104* 134* 147* 217*   Lipid Profile: No results for input(s): CHOL, HDL, LDLCALC, TRIG, CHOLHDL, LDLDIRECT in the last 72 hours. Thyroid Function Tests: No results for input(s): TSH, T4TOTAL, FREET4, T3FREE, THYROIDAB in the last 72 hours. Anemia Panel: No results for input(s): VITAMINB12, FOLATE, FERRITIN, TIBC, IRON, RETICCTPCT in the last 72 hours. Sepsis Labs: No results for input(s): PROCALCITON, LATICACIDVEN in the last 168 hours.  Recent Results (from the past 240 hour(s))  SARS Coronavirus 2 by RT PCR (hospital order, performed in [facility] hospital lab) Nasopharyngeal Nasopharyngeal Swab     Status: None   Collection Time: 11/29/19 11:54 AM   Specimen: Nasopharyngeal Swab  Result Value Ref Range Status   SARS Coronavirus 2 NEGATIVE NEGATIVE Final    Comment: (NOTE) SARS-CoV-2 target nucleic acids are NOT DETECTED.  The SARS-CoV-2 RNA is generally detectable in upper and lower respiratory specimens during the acute phase of infection. The lowest concentration of  SARS-CoV-2 viral copies this assay can detect is 250 copies / mL. A negative result does not preclude SARS-CoV-2 infection and should not be used as the sole basis for treatment or other patient management decisions.  A negative result may occur with improper specimen collection / handling, submission of specimen other than nasopharyngeal swab, presence of viral mutation(s) within the areas targeted by this assay, and inadequate number of viral copies (<250 copies / mL). A negative result must be combined with clinical observations, patient history, and epidemiological information.  Fact Sheet for Patients:   StrictlyIdeas.no  Fact Sheet for Healthcare Providers: BankingDealers.co.za  This test is not yet approved or  cleared by the  [place] FDA and has been authorized for detection and/or diagnosis of SARS-CoV-2 by FDA under an Emergency Use Authorization (EUA).  This EUA will remain in effect (meaning this test can be used) for the duration of the COVID-19 declaration under Section 564(b)(1) of the Act, 21 U.S.C. section 360bbb-3(b)(1), unless the authorization is terminated or revoked sooner.  Performed at [facility], [address], [place], [place] [address]   SARS Coronavirus 2 by RT PCR (hospital order, performed in [facility] hospital lab) Nasopharyngeal Nasopharyngeal Swab     Status: None   Collection Time: 12/03/19  1:33 PM   Specimen: Nasopharyngeal Swab  Result Value Ref Range Status   SARS Coronavirus 2 NEGATIVE NEGATIVE Final    Comment: (NOTE) SARS-CoV-2 target nucleic acids are NOT DETECTED.  The SARS-CoV-2 RNA is generally detectable in upper and lower respiratory specimens during the acute phase of infection. The lowest concentration of SARS-CoV-2 viral copies this assay can detect is 250 copies / mL. A negative result does not preclude SARS-CoV-2 infection and should not be used as the sole basis for treatment or other patient management decisions.  A negative result may occur with improper specimen collection / handling, submission of specimen other than nasopharyngeal swab, presence of viral mutation(s) within the areas targeted by this assay, and inadequate number of viral copies (<250 copies / mL). A negative result must be combined with clinical observations, patient history, and epidemiological information.  Fact Sheet for Patients:   StrictlyIdeas.no  Fact Sheet for Healthcare Providers: BankingDealers.co.za  This test is not yet approved or  cleared by the [place] FDA and has been authorized for detection and/or diagnosis of SARS-CoV-2 by FDA under an Emergency Use Authorization (EUA).  This EUA will remain in effect  (meaning this test can be used) for the duration of the COVID-19 declaration under Section 564(b)(1) of the Act, 21 U.S.C. section 360bbb-3(b)(1), unless the authorization is terminated or revoked sooner.  Performed at [facility], [address], [place], [place] [address]          Radiology Studies: No results found.      Scheduled Meds: .  stroke: mapping our early stages of recovery book   Does not apply Once  . aspirin  81 mg Oral Daily  . atorvastatin  20 mg Oral QHS  . calcium-vitamin D  1 tablet Oral Q breakfast  . cholecalciferol  400 Units Oral Daily  . docusate sodium  100 mg Oral BID  . enoxaparin (LOVENOX) injection  40 mg Subcutaneous Q24H  . feeding supplement (GLUCERNA SHAKE)  237 mL Oral TID BM  . fentaNYL  1 patch Transdermal Q72H  . fluticasone  1 spray Each Nare BID  . gabapentin  300 mg Oral TID  . HYDROcodone-acetaminophen  1 tablet Oral Q8H  .  insulin aspart  0-5 Units Subcutaneous QHS  . insulin aspart  0-9 Units Subcutaneous TID WC  . lactulose  10 g Oral q AM  . levothyroxine  88 mcg Oral QAC breakfast  . loratadine  10 mg Oral Daily  . metoprolol tartrate  25 mg Oral BID  . nortriptyline  25 mg Oral QHS  . pantoprazole  40 mg Oral Daily  . predniSONE  40 mg Oral BID WC   Continuous Infusions:   LOS: 13 days    Time spent: 15 mins,More than 50% of that time was spent in counseling and/or coordination of care.      Shelly Coss, MD [facility] P9/24/2021, 7:54 AM

## 2019-12-06 NOTE — TOC Progression Note (Signed)
Transition of Care [facility]) - Progression Note    Patient Details  Name: Christina Fry MRN: [id number] Date of Birth: Jun 23, 1940  Transition of Care [facility]) CM/SW Contact  Shade Flood, LCSW Phone Number: 12/06/2019, 2:10 PM  Clinical Narrative:     TOC following. Made telephone follow up contact attempts with all of the [facility] contracted SNF facilities that have been sent referrals on pt. One bed offer was made from [facility]. Their number is [phone]. This LCSW spoke with [employer] manager rep) who was filling in for Admissions Coordinator, [place]. Per Davy Pique, she could see patient's packet of referral paperwork and there was a note on it stating that the administrator approved her. Per Davy Pique, they cannot admit pt until at least Monday when Tammy can follow up regarding the confirmation of VA authorization.   Spoke with pt's daughter by phone to update. Daughter is agreeable to accept this bed offer for pt. Communicated above to [employer] at the [place] and she stated that she would go ahead and send the [place] authorization information to the [place] today. Spoke with Davy Pique at the SNF again and she stated that they will still not be able to accept pt until Monday at the earliest but that she will leave notes for Tammy to inform of all of above.  Theodoro Clock at the [place] stated that the [place] will not cover the cost of EMS transport to the SNF. Discussed with pt's daughter who spoke with pt and pt states that in the past, she has been billed by the EMS transport and then she can submit to "[employer]" and they will pay the bill so they would like EMS transport.  Spoke with Davy Pique again at the SNF and based on paperwork and planning that will still need to occur on Monday, she recommends planning for transfer on Tuesday 9/28. Arranged EMS transport with [facility] for [place] on 9/28. Will ask MD to plan to order COVID test for pt on 9/27. Daughter updated.  TOC will follow.    Expected Discharge Plan:  Skilled Nursing Facility Barriers to Discharge: SNF Pending bed offer  Expected Discharge Plan and Services Expected Discharge Plan: [place] Choice: [place] arrangements for the past 2 months: Single Family Home Expected Discharge Date: 12/03/19               DME Arranged: N/A DME Agency: NA       HH Arranged: NA HH Agency: NA         Social Determinants of Health (SDOH) Interventions    Readmission Risk Interventions Readmission Risk Prevention Plan 12/06/2019  Transportation Screening Complete  Social Work Consult for [place] Planning/Counseling Complete  Medication Review Press photographer) Complete  Some recent data might be hidden

## 2019-12-07 LAB — GLUCOSE, CAPILLARY
Glucose-Capillary: 111 mg/dL — ABNORMAL HIGH (ref 70–99)
Glucose-Capillary: 195 mg/dL — ABNORMAL HIGH (ref 70–99)
Glucose-Capillary: 127 mg/dL — ABNORMAL HIGH (ref 70–99)
Glucose-Capillary: 313 mg/dL — ABNORMAL HIGH (ref 70–99)
Glucose-Capillary: 250 mg/dL — ABNORMAL HIGH (ref 70–99)

## 2019-12-07 LAB — CREATININE, SERUM
Creatinine, Ser: 1.07 mg/dL — ABNORMAL HIGH (ref 0.44–1.00)
GFR calc Af Amer: 56 mL/min — ABNORMAL LOW (ref >60)
GFR calc non Af Amer: 49 mL/min — ABNORMAL LOW (ref >60)

## 2019-12-07 NOTE — Progress Notes (Signed)
PROGRESS NOTE    Christina Fry  XFG:[id number] DOB: 09-02-1940 DOA: 11/22/2019 PCP: Ferdie Ping, MD   Brief Narrative:  Patient is 79 year old female with history of polymyalgia rheumatica, fibromyalgia, GERD, hypertension, hypothyroidism who presents to the emergency department on 11/22/2019 with progressive worsening generalized weakness, shakiness, multiple tender joints, falls at home.  She ambulates with the help of walker, scooter at home.  She complains of pain everywhere.  On presentation she was hemodynamically stable.  Extensive imagings done after admission did not show any acute abnormalities.  She has chronic T5 compression fracture.  Patient was managed for polymyalgia rheumatica and was started on steroids.  Currently waiting for risk nursing facility placement.  She is hemodynamically stable for discharge to skilled nursing facility as soon as bed is available.  Assessment & Plan:   Principal Problem:   Weakness of both lower extremities Active Problems:   Generalized weakness   PMR (polymyalgia rheumatica) (HCC)   GERD (gastroesophageal reflux disease)   Fibromyalgia   Essential hypertension   Hypothyroidism   Ambulatory dysfunction   Accident due to mechanical fall without injury   TIA (transient ischemic attack)   Acute flareup of PMR: Significantly debilitated and deconditioned due to this problem.  Has a long history and was admitted in the rehab in the past for extensive physical therapy.  Presented with bilateral lower extremities pain, generalized weakness, blurry vision, fall.  She had improved with prednisone in the past but had stopped taking it recently.  Neurology was consulted also during this admission. Patient was treated with high-dose steroids IV but has been changed to 40 mg prednisone twice a day for now.  Also on pain medications, gabapentin. PT/OT recommended skilled facility on discharge.  Weakness of bilateral lower extremities/blurry vision:  Initially stroke was suspected but MRI did not show any acute intracranial abnormalities.  Currently on aspirin and statin.  Ultrasound of the bilateral lower extremity did not show any DVT.  Diabetes type 2: Watch for hyperglycemia in the setting of prednisone use.  Continue sliding-scale insulin.  Fibromyalgia: Continue supportive care.  On gabapentin, nortriptyline  Hypertension: Currently blood pressure stable.  Continue current regimen  Hypothyroidism: Continue Synthyroid  GERD: Continue Protonix  Debility/deconditioning: PT/OT recommended skilled nursing facility.  She has a bed availability in a skilled nursing facility on 12/10/2019.  Please order Covid screening test on 12/09/2019  Nutrition Problem: Inadequate oral intake Etiology: acute illness, chronic illness (acute flare of chronic polymyalgia rheumatica)      DVT prophylaxis:Lovenox Code Status: Full Family Communication: None at the bedside Status is: Inpatient  Remains inpatient appropriate because:Unsafe d/c plan   Dispo: The patient is from: Home              Anticipated d/c is to: SNF              Anticipated d/c date is: 2 days              Patient currently is medically stable to d/c.     Consultants: None  Procedures:None  Antimicrobials:  Anti-infectives (From admission, onward)   None      Subjective: Patient seen and examined the bedside this morning.  Currently stable.  Comfortable.  No complaints  Objective: Vitals:   12/05/19 2200 12/06/19 0532 12/06/19 1434 12/06/19 2030  BP: (!) 161/70 (!) 164/67 (!) 158/57 (!) 154/66  Pulse: 73 64 73 75  Resp: 18 16 16 16   Temp: 98.8 F (37.1 C) 98.4 F (  36.9 C) 98.7 F (37.1 C) 98.8 F (37.1 C)  TempSrc: Oral Oral Oral   SpO2: 100% 97% 99% 100%  Weight:      Height:        Intake/Output Summary (Last 24 hours) at 12/07/2019 1216 Last data filed at 12/07/2019 0900 Gross per 24 hour  Intake 240 ml  Output --  Net 240 ml   Filed  Weights   11/22/19 1842 12/03/19 0523  Weight: 73 kg 73.4 kg    Examination:  General exam: Appears calm and comfortable ,Not in distress, deconditioned, debilitated elderly pleasant female  HEENT:PERRL,Oral mucosa moist, Ear/Nose normal on gross exam Respiratory system: Bilateral equal air entry, normal vesicular breath sounds, no wheezes or crackles  Cardiovascular system: S1 & S2 heard, RRR. No JVD, murmurs, rubs, gallops or clicks. Gastrointestinal system: Abdomen is nondistended, soft and nontender. No organomegaly or masses felt. Normal bowel sounds heard. Central nervous system: Alert and oriented. No focal neurological deficits. Extremities: No edema, no clubbing ,no cyanosis Skin: No rashes, lesions or ulcers,no icterus ,no pallor     Data Reviewed: I have personally reviewed following labs and imaging studies  CBC: Recent Labs  Lab 12/02/19 0703 12/06/19 0648  WBC 10.4 16.0*  NEUTROABS 8.5* 12.1*  HGB 10.8* 13.2  HCT 35.0* 42.2  MCV 96.7 95.9  PLT 288 956   Basic Metabolic Panel: Recent Labs  Lab 12/02/19 0703 12/06/19 0648 12/07/19 0647  NA 140 137  --   K 3.9 3.9  --   CL 104 99  --   CO2 29 29  --   GLUCOSE 117* 115*  --   BUN 22 27*  --   CREATININE 1.19* 1.16* 1.07*  CALCIUM 8.3* 9.3  --    GFR: Estimated Creatinine Clearance: 39.6 mL/min (A) (by C-G formula based on SCr of 1.07 mg/dL (H)). Liver Function Tests: Recent Labs  Lab 12/02/19 0703  AST 9*  ALT 14  ALKPHOS 51  BILITOT 0.7  PROT 5.1*  ALBUMIN 2.7*   No results for input(s): LIPASE, AMYLASE in the last 168 hours. No results for input(s): AMMONIA in the last 168 hours. Coagulation Profile: No results for input(s): INR, PROTIME in the last 168 hours. Cardiac Enzymes: No results for input(s): CKTOTAL, CKMB, CKMBINDEX, TROPONINI in the last 168 hours. BNP (last 3 results) No results for input(s): PROBNP in the last 8760 hours. HbA1C: No results for input(s): HGBA1C in the last  72 hours. CBG: Recent Labs  Lab 12/06/19 1129 12/06/19 1624 12/06/19 2024 12/07/19 0725 12/07/19 1100  GLUCAP 141* 205* 176* 111* 195*   Lipid Profile: No results for input(s): CHOL, HDL, LDLCALC, TRIG, CHOLHDL, LDLDIRECT in the last 72 hours. Thyroid Function Tests: No results for input(s): TSH, T4TOTAL, FREET4, T3FREE, THYROIDAB in the last 72 hours. Anemia Panel: No results for input(s): VITAMINB12, FOLATE, FERRITIN, TIBC, IRON, RETICCTPCT in the last 72 hours. Sepsis Labs: No results for input(s): PROCALCITON, LATICACIDVEN in the last 168 hours.  Recent Results (from the past 240 hour(s))  SARS Coronavirus 2 by RT PCR (hospital order, performed in [facility] hospital lab) Nasopharyngeal Nasopharyngeal Swab     Status: None   Collection Time: 11/29/19 11:54 AM   Specimen: Nasopharyngeal Swab  Result Value Ref Range Status   SARS Coronavirus 2 NEGATIVE NEGATIVE Final    Comment: (NOTE) SARS-CoV-2 target nucleic acids are NOT DETECTED.  The SARS-CoV-2 RNA is generally detectable in upper and lower respiratory specimens during the acute phase of  infection. The lowest concentration of SARS-CoV-2 viral copies this assay can detect is 250 copies / mL. A negative result does not preclude SARS-CoV-2 infection and should not be used as the sole basis for treatment or other patient management decisions.  A negative result may occur with improper specimen collection / handling, submission of specimen other than nasopharyngeal swab, presence of viral mutation(s) within the areas targeted by this assay, and inadequate number of viral copies (<250 copies / mL). A negative result must be combined with clinical observations, patient history, and epidemiological information.  Fact Sheet for Patients:   StrictlyIdeas.no  Fact Sheet for Healthcare Providers: BankingDealers.co.za  This test is not yet approved or  cleared by the [place] FDA and has been authorized for detection and/or diagnosis of SARS-CoV-2 by FDA under an Emergency Use Authorization (EUA).  This EUA will remain in effect (meaning this test can be used) for the duration of the COVID-19 declaration under Section 564(b)(1) of the Act, 21 U.S.C. section 360bbb-3(b)(1), unless the authorization is terminated or revoked sooner.  Performed at [facility], [address] [place], [place] [address]   SARS Coronavirus 2 by RT PCR (hospital order, performed in [facility] hospital lab) Nasopharyngeal Nasopharyngeal Swab     Status: None   Collection Time: 12/03/19  1:33 PM   Specimen: Nasopharyngeal Swab  Result Value Ref Range Status   SARS Coronavirus 2 NEGATIVE NEGATIVE Final    Comment: (NOTE) SARS-CoV-2 target nucleic acids are NOT DETECTED.  The SARS-CoV-2 RNA is generally detectable in upper and lower respiratory specimens during the acute phase of infection. The lowest concentration of SARS-CoV-2 viral copies this assay can detect is 250 copies / mL. A negative result does not preclude SARS-CoV-2 infection and should not be used as the sole basis for treatment or other patient management decisions.  A negative result may occur with improper specimen collection / handling, submission of specimen other than nasopharyngeal swab, presence of viral mutation(s) within the areas targeted by this assay, and inadequate number of viral copies (<250 copies / mL). A negative result must be combined with clinical observations, patient history, and epidemiological information.  Fact Sheet for Patients:   StrictlyIdeas.no  Fact Sheet for Healthcare Providers: BankingDealers.co.za  This test is not yet approved or  cleared by the [place] FDA and has been authorized for detection and/or diagnosis of SARS-CoV-2 by FDA under an Emergency Use Authorization (EUA).  This EUA will remain in effect (meaning  this test can be used) for the duration of the COVID-19 declaration under Section 564(b)(1) of the Act, 21 U.S.C. section 360bbb-3(b)(1), unless the authorization is terminated or revoked sooner.  Performed at [facility], [address], [place], [place] [address]          Radiology Studies: No results found.      Scheduled Meds: .  stroke: mapping our early stages of recovery book   Does not apply Once  . aspirin  81 mg Oral Daily  . atorvastatin  20 mg Oral QHS  . calcium-vitamin D  1 tablet Oral Q breakfast  . cholecalciferol  400 Units Oral Daily  . docusate sodium  100 mg Oral BID  . enoxaparin (LOVENOX) injection  40 mg Subcutaneous Q24H  . feeding supplement (GLUCERNA SHAKE)  237 mL Oral TID BM  . fentaNYL  1 patch Transdermal Q72H  . fluticasone  1 spray Each Nare BID  . gabapentin  300 mg Oral TID  . HYDROcodone-acetaminophen  1 tablet Oral Q8H  . insulin aspart  0-5 Units Subcutaneous QHS  . insulin aspart  0-9 Units Subcutaneous TID WC  . lactulose  10 g Oral q AM  . levothyroxine  88 mcg Oral QAC breakfast  . loratadine  10 mg Oral Daily  . metoprolol tartrate  25 mg Oral BID  . nortriptyline  25 mg Oral QHS  . pantoprazole  40 mg Oral Daily  . predniSONE  40 mg Oral BID WC   Continuous Infusions:   LOS: 14 days    Time spent: 15 mins,More than 50% of that time was spent in counseling and/or coordination of care.      Shelly Coss, MD [facility] P9/25/2021, 12:16 PM

## 2019-12-08 LAB — GLUCOSE, CAPILLARY
Glucose-Capillary: 117 mg/dL — ABNORMAL HIGH (ref 70–99)
Glucose-Capillary: 156 mg/dL — ABNORMAL HIGH (ref 70–99)
Glucose-Capillary: 179 mg/dL — ABNORMAL HIGH (ref 70–99)
Glucose-Capillary: 214 mg/dL — ABNORMAL HIGH (ref 70–99)

## 2019-12-08 NOTE — Progress Notes (Signed)
PROGRESS NOTE    Christina Fry  YOV:[id number] DOB: 1940-03-20 DOA: 11/22/2019 PCP: Ferdie Ping, MD   Brief Narrative:  Patient is 79 year old female with history of polymyalgia rheumatica, fibromyalgia, GERD, hypertension, hypothyroidism who presents to the emergency department on 11/22/2019 with progressive worsening generalized weakness, shakiness, multiple tender joints, falls at home.  She ambulates with the help of walker, scooter at home.  She complains of pain everywhere.  On presentation she was hemodynamically stable.  Extensive imagings done after admission did not show any acute abnormalities.  She has chronic T5 compression fracture.  Patient was managed for polymyalgia rheumatica and was started on steroids.  Currently waiting for risk nursing facility placement.  She is hemodynamically stable for discharge to skilled nursing facility as soon as bed is available.  Assessment & Plan:   Principal Problem:   Weakness of both lower extremities Active Problems:   Generalized weakness   PMR (polymyalgia rheumatica) (HCC)   GERD (gastroesophageal reflux disease)   Fibromyalgia   Essential hypertension   Hypothyroidism   Ambulatory dysfunction   Accident due to mechanical fall without injury   TIA (transient ischemic attack)   Acute flareup of PMR: Significantly debilitated and deconditioned due to this problem.  Has a long history and was admitted in the rehab in the past for extensive physical therapy.  Presented with bilateral lower extremities pain, generalized weakness, blurry vision, fall.  She had improved with prednisone in the past but had stopped taking it recently.  Neurology was consulted also during this admission. Patient was treated with high-dose steroids IV but has been changed to 40 mg prednisone twice a day for now.  Also on pain medications, gabapentin. PT/OT recommended skilled facility on discharge.  Weakness of bilateral lower extremities/blurry vision:  Initially stroke was suspected but MRI did not show any acute intracranial abnormalities.  Currently on aspirin and statin.  Ultrasound of the bilateral lower extremity did not show any DVT.  Diabetes type 2: Watch for hyperglycemia in the setting of prednisone use.  Continue sliding-scale insulin.  Fibromyalgia: Continue supportive care.  On gabapentin, nortriptyline  Hypertension: Currently blood pressure stable.  Continue current regimen  Hypothyroidism: Continue Synthyroid  GERD: Continue Protonix  Debility/deconditioning: PT/OT recommended skilled nursing facility.  She has a bed availability in a skilled nursing facility on 12/10/2019.  Please order Covid screening test on 12/09/2019  Nutrition Problem: Inadequate oral intake Etiology: acute illness, chronic illness (acute flare of chronic polymyalgia rheumatica)      DVT prophylaxis:Lovenox Code Status: Full Family Communication: None at the bedside Status is: Inpatient  Remains inpatient appropriate because:Unsafe d/c plan   Dispo: The patient is from: Home              Anticipated d/c is to: SNF              Anticipated d/c date is: 2 days              Patient currently is medically stable to d/c.     Consultants: None  Procedures:None  Antimicrobials:  Anti-infectives (From admission, onward)   None      Subjective:  Patient seen and examined the bedside this morning.  Comfortable.  No new complaints.  Objective: Vitals:   12/07/19 1415 12/07/19 2010 12/07/19 2325 12/08/19 0514  BP: (!) 129/53 (!) 153/62 (!) 178/69 (!) 171/64  Pulse: 72 78 75 78  Resp: 18 17 18 18   Temp: 99 F (37.2 C) 98.6 F (37  C) 98.5 F (36.9 C)   TempSrc:  Oral    SpO2: 99% 98% 99% 98%  Weight:      Height:        Intake/Output Summary (Last 24 hours) at 12/08/2019 0758 Last data filed at 12/07/2019 1700 Gross per 24 hour  Intake 720 ml  Output --  Net 720 ml   Filed Weights   11/22/19 1842 12/03/19 0523  Weight:  73 kg 73.4 kg    Examination:  General exam: Deconditioned, debilitated, pleasant elderly female  HEENT:PERRL,Oral mucosa moist, Ear/Nose normal on gross exam Respiratory system: Bilateral equal air entry, normal vesicular breath sounds, no wheezes or crackles  Cardiovascular system: S1 & S2 heard, RRR. No JVD, murmurs, rubs, gallops or clicks. Gastrointestinal system: Abdomen is nondistended, soft and nontender. No organomegaly or masses felt. Normal bowel sounds heard. Central nervous system: Alert and oriented. No focal neurological deficits. Extremities: No edema, no clubbing ,no cyanosis Skin: No rashes, lesions or ulcers,no icterus ,no pallor    Data Reviewed: I have personally reviewed following labs and imaging studies  CBC: Recent Labs  Lab 12/02/19 0703 12/06/19 0648  WBC 10.4 16.0*  NEUTROABS 8.5* 12.1*  HGB 10.8* 13.2  HCT 35.0* 42.2  MCV 96.7 95.9  PLT 288 401   Basic Metabolic Panel: Recent Labs  Lab 12/02/19 0703 12/06/19 0648 12/07/19 0647  NA 140 137  --   K 3.9 3.9  --   CL 104 99  --   CO2 29 29  --   GLUCOSE 117* 115*  --   BUN 22 27*  --   CREATININE 1.19* 1.16* 1.07*  CALCIUM 8.3* 9.3  --    GFR: Estimated Creatinine Clearance: 39.6 mL/min (A) (by C-G formula based on SCr of 1.07 mg/dL (H)). Liver Function Tests: Recent Labs  Lab 12/02/19 0703  AST 9*  ALT 14  ALKPHOS 51  BILITOT 0.7  PROT 5.1*  ALBUMIN 2.7*   No results for input(s): LIPASE, AMYLASE in the last 168 hours. No results for input(s): AMMONIA in the last 168 hours. Coagulation Profile: No results for input(s): INR, PROTIME in the last 168 hours. Cardiac Enzymes: No results for input(s): CKTOTAL, CKMB, CKMBINDEX, TROPONINI in the last 168 hours. BNP (last 3 results) No results for input(s): PROBNP in the last 8760 hours. HbA1C: No results for input(s): HGBA1C in the last 72 hours. CBG: Recent Labs  Lab 12/07/19 1100 12/07/19 1603 12/07/19 2008 12/07/19 2321  12/08/19 0736  GLUCAP 195* 127* 313* 250* 117*   Lipid Profile: No results for input(s): CHOL, HDL, LDLCALC, TRIG, CHOLHDL, LDLDIRECT in the last 72 hours. Thyroid Function Tests: No results for input(s): TSH, T4TOTAL, FREET4, T3FREE, THYROIDAB in the last 72 hours. Anemia Panel: No results for input(s): VITAMINB12, FOLATE, FERRITIN, TIBC, IRON, RETICCTPCT in the last 72 hours. Sepsis Labs: No results for input(s): PROCALCITON, LATICACIDVEN in the last 168 hours.  Recent Results (from the past 240 hour(s))  SARS Coronavirus 2 by RT PCR (hospital order, performed in [facility] hospital lab) Nasopharyngeal Nasopharyngeal Swab     Status: None   Collection Time: 11/29/19 11:54 AM   Specimen: Nasopharyngeal Swab  Result Value Ref Range Status   SARS Coronavirus 2 NEGATIVE NEGATIVE Final    Comment: (NOTE) SARS-CoV-2 target nucleic acids are NOT DETECTED.  The SARS-CoV-2 RNA is generally detectable in upper and lower respiratory specimens during the acute phase of infection. The lowest concentration of SARS-CoV-2 viral copies this assay can detect  is 250 copies / mL. A negative result does not preclude SARS-CoV-2 infection and should not be used as the sole basis for treatment or other patient management decisions.  A negative result may occur with improper specimen collection / handling, submission of specimen other than nasopharyngeal swab, presence of viral mutation(s) within the areas targeted by this assay, and inadequate number of viral copies (<250 copies / mL). A negative result must be combined with clinical observations, patient history, and epidemiological information.  Fact Sheet for Patients:   StrictlyIdeas.no  Fact Sheet for Healthcare Providers: BankingDealers.co.za  This test is not yet approved or  cleared by the [place] FDA and has been authorized for detection and/or diagnosis of SARS-CoV-2 by FDA under an  Emergency Use Authorization (EUA).  This EUA will remain in effect (meaning this test can be used) for the duration of the COVID-19 declaration under Section 564(b)(1) of the Act, 21 U.S.C. section 360bbb-3(b)(1), unless the authorization is terminated or revoked sooner.  Performed at [facility], [address] [place], [place] [address]   SARS Coronavirus 2 by RT PCR (hospital order, performed in [facility] hospital lab) Nasopharyngeal Nasopharyngeal Swab     Status: None   Collection Time: 12/03/19  1:33 PM   Specimen: Nasopharyngeal Swab  Result Value Ref Range Status   SARS Coronavirus 2 NEGATIVE NEGATIVE Final    Comment: (NOTE) SARS-CoV-2 target nucleic acids are NOT DETECTED.  The SARS-CoV-2 RNA is generally detectable in upper and lower respiratory specimens during the acute phase of infection. The lowest concentration of SARS-CoV-2 viral copies this assay can detect is 250 copies / mL. A negative result does not preclude SARS-CoV-2 infection and should not be used as the sole basis for treatment or other patient management decisions.  A negative result may occur with improper specimen collection / handling, submission of specimen other than nasopharyngeal swab, presence of viral mutation(s) within the areas targeted by this assay, and inadequate number of viral copies (<250 copies / mL). A negative result must be combined with clinical observations, patient history, and epidemiological information.  Fact Sheet for Patients:   StrictlyIdeas.no  Fact Sheet for Healthcare Providers: BankingDealers.co.za  This test is not yet approved or  cleared by the [place] FDA and has been authorized for detection and/or diagnosis of SARS-CoV-2 by FDA under an Emergency Use Authorization (EUA).  This EUA will remain in effect (meaning this test can be used) for the duration of the COVID-19 declaration under Section 564(b)(1) of the  Act, 21 U.S.C. section 360bbb-3(b)(1), unless the authorization is terminated or revoked sooner.  Performed at [facility], [address], [place], [place] [address]          Radiology Studies: No results found.      Scheduled Meds: .  stroke: mapping our early stages of recovery book   Does not apply Once  . aspirin  81 mg Oral Daily  . atorvastatin  20 mg Oral QHS  . calcium-vitamin D  1 tablet Oral Q breakfast  . cholecalciferol  400 Units Oral Daily  . docusate sodium  100 mg Oral BID  . enoxaparin (LOVENOX) injection  40 mg Subcutaneous Q24H  . feeding supplement (GLUCERNA SHAKE)  237 mL Oral TID BM  . fentaNYL  1 patch Transdermal Q72H  . fluticasone  1 spray Each Nare BID  . gabapentin  300 mg Oral TID  . HYDROcodone-acetaminophen  1 tablet Oral Q8H  . insulin aspart  0-5 Units  Subcutaneous QHS  . insulin aspart  0-9 Units Subcutaneous TID WC  . lactulose  10 g Oral q AM  . levothyroxine  88 mcg Oral QAC breakfast  . loratadine  10 mg Oral Daily  . metoprolol tartrate  25 mg Oral BID  . nortriptyline  25 mg Oral QHS  . pantoprazole  40 mg Oral Daily  . predniSONE  40 mg Oral BID WC   Continuous Infusions:   LOS: 15 days    Time spent: 15 mins,More than 50% of that time was spent in counseling and/or coordination of care.      Shelly Coss, MD [facility] P9/26/2021, 7:58 AM

## 2019-12-09 LAB — RESPIRATORY PANEL BY RT PCR (FLU A&B, COVID)
Influenza A by PCR: NEGATIVE
Influenza B by PCR: NEGATIVE
SARS Coronavirus 2 by RT PCR: NEGATIVE

## 2019-12-09 LAB — BASIC METABOLIC PANEL
Anion gap: 8 (ref 5–15)
BUN: 32 mg/dL — ABNORMAL HIGH (ref 8–23)
CO2: 30 mmol/L (ref 22–32)
Calcium: 8.9 mg/dL (ref 8.9–10.3)
Chloride: 98 mmol/L (ref 98–111)
Creatinine, Ser: 1.18 mg/dL — ABNORMAL HIGH (ref 0.44–1.00)
GFR calc Af Amer: 50 mL/min — ABNORMAL LOW (ref >60)
GFR calc non Af Amer: 43 mL/min — ABNORMAL LOW (ref >60)
Glucose, Bld: 220 mg/dL — ABNORMAL HIGH (ref 70–99)
Potassium: 4.1 mmol/L (ref 3.5–5.1)
Sodium: 136 mmol/L (ref 135–145)

## 2019-12-09 LAB — GLUCOSE, CAPILLARY
Glucose-Capillary: 104 mg/dL — ABNORMAL HIGH (ref 70–99)
Glucose-Capillary: 171 mg/dL — ABNORMAL HIGH (ref 70–99)
Glucose-Capillary: 147 mg/dL — ABNORMAL HIGH (ref 70–99)
Glucose-Capillary: 280 mg/dL — ABNORMAL HIGH (ref 70–99)

## 2019-12-09 LAB — CBC WITH DIFFERENTIAL/PLATELET
Abs Immature Granulocytes: 0.36 10*3/uL — ABNORMAL HIGH (ref 0.00–0.07)
Basophils Absolute: 0 10*3/uL (ref 0.0–0.1)
Basophils Relative: 0 %
Eosinophils Absolute: 0 10*3/uL (ref 0.0–0.5)
Eosinophils Relative: 0 %
HCT: 37.8 % (ref 36.0–46.0)
Hemoglobin: 12 g/dL (ref 12.0–15.0)
Immature Granulocytes: 2 %
Lymphocytes Relative: 4 %
Lymphs Abs: 0.8 10*3/uL (ref 0.7–4.0)
MCH: 30.7 pg (ref 26.0–34.0)
MCHC: 31.7 g/dL (ref 30.0–36.0)
MCV: 96.7 fL (ref 80.0–100.0)
Monocytes Absolute: 0.9 10*3/uL (ref 0.1–1.0)
Monocytes Relative: 5 %
Neutro Abs: 17.5 10*3/uL — ABNORMAL HIGH (ref 1.7–7.7)
Neutrophils Relative %: 89 %
Platelets: 242 10*3/uL (ref 150–400)
RBC: 3.91 MIL/uL (ref 3.87–5.11)
RDW: 14.7 % (ref 11.5–15.5)
WBC: 19.5 10*3/uL — ABNORMAL HIGH (ref 4.0–10.5)
nRBC: 0 % (ref 0.0–0.2)

## 2019-12-09 MED ORDER — AMLODIPINE BESYLATE 5 MG PO TABS
5.0000 mg | ORAL_TABLET | Freq: Every day | ORAL | Status: DC
  Administered 2019-12-09 – 2019-12-10 (×2): 5 mg via ORAL
  Filled 2019-12-09 (×2): qty 1

## 2019-12-09 NOTE — TOC Progression Note (Signed)
Transition of Care [facility]) - Progression Note    Patient Details  Name: ALECE KOPPEL MRN: [id number] Date of Birth: 22-Nov-1940  Transition of Care [facility] CM/SW [place], [place] Phone Number: 12/09/2019, 3:42 PM  Clinical Narrative:    CSW Tammy with [facility] this AM to follow up on pt discharging tomorrow 9/28. Tammy stated that she did not have needed forms and would have to contact the [place] to see if they could complete and send documents. Tammy stated that if the documents were not in her hand then the pt would be unable to come at planned D/C date. CSW received call from Tammy at 3:50 that documents had been faxed. Tammy needs proof of pts vaccination records. CSW informed Tammy that pts [name] will be bringing vaccination record along with POA paperwork. TOC faxed current medication list to Tammy at [phone]. CSW informed Tammy that EMS is scheduled for 10:00am. Contact for Lynelle Smoke is [phone]. TOC to follow.    Expected Discharge Plan: Skilled Nursing Facility Barriers to Discharge: SNF Pending bed offer  Expected Discharge Plan and Services Expected Discharge Plan: [place] Choice: [place] arrangements for the past 2 months: Single Family Home Expected Discharge Date: 12/03/19               DME Arranged: N/A DME Agency: NA       HH Arranged: NA HH Agency: NA         Social Determinants of Health (SDOH) Interventions    Readmission Risk Interventions Readmission Risk Prevention Plan 12/06/2019  Transportation Screening Complete  Social Work Consult for [place] Planning/Counseling Complete  Medication Review Press photographer) Complete  Some recent data might be hidden

## 2019-12-09 NOTE — Progress Notes (Signed)
PROGRESS NOTE    Christina Fry  BOF:[id number] DOB: November 03, 1940 DOA: 11/22/2019 PCP: Ferdie Ping, MD   Brief Narrative:  Patient is 79 year old female with history of polymyalgia rheumatica, fibromyalgia, GERD, hypertension, hypothyroidism who presents to the emergency department on 11/22/2019 with progressive worsening generalized weakness, shakiness, multiple tender joints, falls at home.  She ambulates with the help of walker, scooter at home.  She complains of pain everywhere.  On presentation she was hemodynamically stable.  Extensive imagings done after admission did not show any acute abnormalities.  She has chronic T5 compression fracture.  Patient was managed for polymyalgia rheumatica and was started on steroids with improvement.   She will be discharged to skilled nursing facility at [facility] tomorrow.  Assessment & Plan:   Principal Problem:   Weakness of both lower extremities Active Problems:   Generalized weakness   PMR (polymyalgia rheumatica) (HCC)   GERD (gastroesophageal reflux disease)   Fibromyalgia   Essential hypertension   Hypothyroidism   Ambulatory dysfunction   Accident due to mechanical fall without injury   TIA (transient ischemic attack)   Acute flareup of PMR: Significantly debilitated and deconditioned due to this problem.  Has a long history and was admitted in the rehab in the past for extensive physical therapy.  Presented with bilateral lower extremities pain, generalized weakness, blurry vision, fall.  She had improved with prednisone in the past but had stopped taking it recently.  Neurology was consulted also during this admission. Patient was treated with high-dose steroids IV but has been changed to 40 mg prednisone twice a day for now.  Also on pain medications, gabapentin. PT/OT recommended skilled facility on discharge.  Weakness of bilateral lower extremities/blurry vision: Initially stroke was suspected but MRI did not show any acute  intracranial abnormalities.  Currently on aspirin and statin.  Ultrasound of the bilateral lower extremity did not show any DVT.  Hyperglycemia : Watch for hyperglycemia in the setting of prednisone use.  Continue sliding-scale insulin.  Her recent hemoglobin A1c is 5.6.  Fibromyalgia: Continue supportive care.  On gabapentin, nortriptyline  Hypertension: Continue current medications  Hypothyroidism: Continue Synthyroid  GERD: Continue Protonix  Debility/deconditioning: PT/OT recommended skilled nursing facility.  She has a bed availability in a skilled nursing facility on 12/10/2019.    Nutrition Problem: Inadequate oral intake Etiology: acute illness, chronic illness (acute flare of chronic polymyalgia rheumatica)      DVT prophylaxis:Lovenox Code Status: Full Family Communication: None at the bedside Status is: Inpatient  Remains inpatient appropriate because:Unsafe d/c plan   Dispo: The patient is from: Home              Anticipated d/c is to: SNF              Anticipated d/c date is: 1 day              Patient currently is medically stable to d/c.     Consultants: None  Procedures:None  Antimicrobials:  Anti-infectives (From admission, onward)   None      Subjective:  Patient seen and examined at the bedside this morning.  Hemodynamically stable.  Denies any new complaints.  She is asking for some pain medication IV shot before she is released tomorrow so that she can have a comfortable trip  to skilled nursing facility at [facility] .  Objective: Vitals:   12/08/19 0514 12/08/19 0839 12/08/19 2029 12/09/19 0547  BP: (!) 171/64 (!) 154/71 (!) 175/75 (!) 173/67  Pulse: 78 70 75 66  Resp: 18  16 18   Temp:   99 F (37.2 C) 98 F (36.7 C)  TempSrc:   Oral Oral  SpO2: 98%  98% 97%  Weight:      Height:        Intake/Output Summary (Last 24 hours) at 12/09/2019 0810 Last data filed at 12/08/2019 1700 Gross per 24 hour  Intake 720 ml  Output --  Net  720 ml   Filed Weights   11/22/19 1842 12/03/19 0523  Weight: 73 kg 73.4 kg    Examination:   General exam: deconditioned, Debilitated, pleasant elderly female HEENT:PERRL,Oral mucosa moist, Ear/Nose normal on gross exam Respiratory system: Bilateral equal air entry, normal vesicular breath sounds, no wheezes or crackles  Cardiovascular system: S1 & S2 heard, RRR. No JVD, murmurs, rubs, gallops or clicks. Gastrointestinal system: Abdomen is nondistended, soft and nontender. No organomegaly or masses felt. Normal bowel sounds heard. Central nervous system: Alert and oriented. No focal neurological deficits. Extremities: No edema, no clubbing ,no cyanosis, distal peripheral pulses palpable. Skin: No rashes, lesions or ulcers,no icterus ,no pallor    Data Reviewed: I have personally reviewed following labs and imaging studies  CBC: Recent Labs  Lab 12/06/19 0648  WBC 16.0*  NEUTROABS 12.1*  HGB 13.2  HCT 42.2  MCV 95.9  PLT 937   Basic Metabolic Panel: Recent Labs  Lab 12/06/19 0648 12/07/19 0647  NA 137  --   K 3.9  --   CL 99  --   CO2 29  --   GLUCOSE 115*  --   BUN 27*  --   CREATININE 1.16* 1.07*  CALCIUM 9.3  --    GFR: Estimated Creatinine Clearance: 39.6 mL/min (A) (by C-G formula based on SCr of 1.07 mg/dL (H)). Liver Function Tests: No results for input(s): AST, ALT, ALKPHOS, BILITOT, PROT, ALBUMIN in the last 168 hours. No results for input(s): LIPASE, AMYLASE in the last 168 hours. No results for input(s): AMMONIA in the last 168 hours. Coagulation Profile: No results for input(s): INR, PROTIME in the last 168 hours. Cardiac Enzymes: No results for input(s): CKTOTAL, CKMB, CKMBINDEX, TROPONINI in the last 168 hours. BNP (last 3 results) No results for input(s): PROBNP in the last 8760 hours. HbA1C: No results for input(s): HGBA1C in the last 72 hours. CBG: Recent Labs  Lab 12/07/19 2321 12/08/19 0736 12/08/19 1135 12/08/19 1622  12/08/19 2055  GLUCAP 250* 117* 156* 179* 214*   Lipid Profile: No results for input(s): CHOL, HDL, LDLCALC, TRIG, CHOLHDL, LDLDIRECT in the last 72 hours. Thyroid Function Tests: No results for input(s): TSH, T4TOTAL, FREET4, T3FREE, THYROIDAB in the last 72 hours. Anemia Panel: No results for input(s): VITAMINB12, FOLATE, FERRITIN, TIBC, IRON, RETICCTPCT in the last 72 hours. Sepsis Labs: No results for input(s): PROCALCITON, LATICACIDVEN in the last 168 hours.  Recent Results (from the past 240 hour(s))  SARS Coronavirus 2 by RT PCR (hospital order, performed in [facility] hospital lab) Nasopharyngeal Nasopharyngeal Swab     Status: None   Collection Time: 11/29/19 11:54 AM   Specimen: Nasopharyngeal Swab  Result Value Ref Range Status   SARS Coronavirus 2 NEGATIVE NEGATIVE Final    Comment: (NOTE) SARS-CoV-2 target nucleic acids are NOT DETECTED.  The SARS-CoV-2 RNA is generally detectable in upper and lower respiratory specimens during the acute phase of infection. The lowest concentration of SARS-CoV-2 viral copies this assay can detect is 250 copies / mL. A negative result  does not preclude SARS-CoV-2 infection and should not be used as the sole basis for treatment or other patient management decisions.  A negative result may occur with improper specimen collection / handling, submission of specimen other than nasopharyngeal swab, presence of viral mutation(s) within the areas targeted by this assay, and inadequate number of viral copies (<250 copies / mL). A negative result must be combined with clinical observations, patient history, and epidemiological information.  Fact Sheet for Patients:   StrictlyIdeas.no  Fact Sheet for Healthcare Providers: BankingDealers.co.za  This test is not yet approved or  cleared by the [place] FDA and has been authorized for detection and/or diagnosis of SARS-CoV-2 by FDA under an  Emergency Use Authorization (EUA).  This EUA will remain in effect (meaning this test can be used) for the duration of the COVID-19 declaration under Section 564(b)(1) of the Act, 21 U.S.C. section 360bbb-3(b)(1), unless the authorization is terminated or revoked sooner.  Performed at [facility], [address] [place], [place] [address]   SARS Coronavirus 2 by RT PCR (hospital order, performed in [facility] hospital lab) Nasopharyngeal Nasopharyngeal Swab     Status: None   Collection Time: 12/03/19  1:33 PM   Specimen: Nasopharyngeal Swab  Result Value Ref Range Status   SARS Coronavirus 2 NEGATIVE NEGATIVE Final    Comment: (NOTE) SARS-CoV-2 target nucleic acids are NOT DETECTED.  The SARS-CoV-2 RNA is generally detectable in upper and lower respiratory specimens during the acute phase of infection. The lowest concentration of SARS-CoV-2 viral copies this assay can detect is 250 copies / mL. A negative result does not preclude SARS-CoV-2 infection and should not be used as the sole basis for treatment or other patient management decisions.  A negative result may occur with improper specimen collection / handling, submission of specimen other than nasopharyngeal swab, presence of viral mutation(s) within the areas targeted by this assay, and inadequate number of viral copies (<250 copies / mL). A negative result must be combined with clinical observations, patient history, and epidemiological information.  Fact Sheet for Patients:   StrictlyIdeas.no  Fact Sheet for Healthcare Providers: BankingDealers.co.za  This test is not yet approved or  cleared by the [place] FDA and has been authorized for detection and/or diagnosis of SARS-CoV-2 by FDA under an Emergency Use Authorization (EUA).  This EUA will remain in effect (meaning this test can be used) for the duration of the COVID-19 declaration under Section 564(b)(1) of the  Act, 21 U.S.C. section 360bbb-3(b)(1), unless the authorization is terminated or revoked sooner.  Performed at [facility], [address] [place], [place] [address]          Radiology Studies: No results found.      Scheduled Meds: . amLODipine  5 mg Oral Daily  . aspirin  81 mg Oral Daily  . atorvastatin  20 mg Oral QHS  . calcium-vitamin D  1 tablet Oral Q breakfast  . cholecalciferol  400 Units Oral Daily  . docusate sodium  100 mg Oral BID  . enoxaparin (LOVENOX) injection  40 mg Subcutaneous Q24H  . feeding supplement (GLUCERNA SHAKE)  237 mL Oral TID BM  . fentaNYL  1 patch Transdermal Q72H  . fluticasone  1 spray Each Nare BID  . gabapentin  300 mg Oral TID  . HYDROcodone-acetaminophen  1 tablet Oral Q8H  . insulin aspart  0-5 Units Subcutaneous QHS  . insulin aspart  0-9 Units Subcutaneous TID WC  . lactulose  10  g Oral q AM  . levothyroxine  88 mcg Oral QAC breakfast  . loratadine  10 mg Oral Daily  . metoprolol tartrate  25 mg Oral BID  . nortriptyline  25 mg Oral QHS  . pantoprazole  40 mg Oral Daily  . predniSONE  40 mg Oral BID WC   Continuous Infusions:   LOS: 16 days    Time spent: 15 mins,More than 50% of that time was spent in counseling and/or coordination of care.      Shelly Coss, MD [facility] P9/27/2021, 8:10 AM

## 2019-12-10 LAB — GLUCOSE, CAPILLARY: Glucose-Capillary: 131 mg/dL — ABNORMAL HIGH (ref 70–99)

## 2019-12-10 MED ORDER — AMLODIPINE BESYLATE 5 MG PO TABS: 5.0000 mg | ORAL_TABLET | Freq: Every day | ORAL | Status: DC

## 2019-12-10 MED ORDER — ACYCLOVIR 200 MG PO CAPS: 200.0000 mg | ORAL_CAPSULE | Freq: Two times a day (BID) | ORAL | Status: AC

## 2019-12-10 MED ORDER — HYDROCODONE-ACETAMINOPHEN 5-325 MG PO TABS
1.0000 | ORAL_TABLET | Freq: Three times a day (TID) | ORAL | 0 refills | Status: AC | PRN
Start: 2019-12-10 — End: 2019-12-15

## 2019-12-10 MED ORDER — PREDNISONE 20 MG PO TABS: 40.0000 mg | ORAL_TABLET | Freq: Two times a day (BID) | ORAL | 0 refills | Status: DC

## 2019-12-10 MED ORDER — MORPHINE SULFATE (PF) 2 MG/ML IV SOLN
2.0000 mg | Freq: Once | INTRAVENOUS | Status: DC
Start: 2019-12-10 — End: 2019-12-10

## 2019-12-10 MED ORDER — GABAPENTIN 300 MG PO CAPS: 300.0000 mg | ORAL_CAPSULE | Freq: Three times a day (TID) | ORAL | Status: DC

## 2019-12-10 MED ORDER — FENTANYL 12 MCG/HR TD PT72
1.0000 | MEDICATED_PATCH | TRANSDERMAL | 0 refills | Status: DC
Start: 2019-12-10 — End: 2020-08-28

## 2019-12-10 MED ORDER — ACYCLOVIR 200 MG PO CAPS
200.0000 mg | ORAL_CAPSULE | Freq: Two times a day (BID) | ORAL | Status: DC
  Administered 2019-12-10: 200 mg via ORAL
  Filled 2019-12-10: qty 1

## 2019-12-10 MED ORDER — METOPROLOL TARTRATE 25 MG PO TABS: 25.0000 mg | ORAL_TABLET | Freq: Two times a day (BID) | ORAL | Status: DC

## 2019-12-10 NOTE — Discharge Summary (Addendum)
Physician Discharge Summary  Christina Fry MWN:[id number] DOB: 1941/02/14 DOA: 11/22/2019  PCP: Ferdie Ping, MD  Admit date: 11/22/2019 Discharge date: 12/10/2019  Admitted From: Home Disposition: SNF  Discharge Condition:Stable CODE STATUS:FULL Diet recommendation: Heart Healthy  Brief/Interim Summary:  Patient is 79 year old female with history of polymyalgia rheumatica, fibromyalgia, GERD, hypertension, hypothyroidism who presents to the emergency department on 11/22/2019 with progressive worsening generalized weakness, shakiness, multiple tender joints, falls at home.  She ambulates with the help of walker, scooter at home.  She complains of pain everywhere.  On presentation she was hemodynamically stable.  Extensive imagings done after admission did not show any acute abnormalities.  She has chronic T5 compression fracture.  Patient was managed for polymyalgia rheumatica and was started on steroids with improvement. She will be discharged to skilled nursing facility today.  Following problems were addressed during hospitalization:  Acute flareup of PMR: Significantly debilitated and deconditioned due to this problem.  Has a long history and was admitted in the rehab in the past for extensive physical therapy.  Presented with bilateral lower extremities pain, generalized weakness, blurry vision, fall.  She had improved with prednisone in the past but had stopped taking it recently.  Neurology was consulted also during this admission. Patient was treated with high-dose steroids IV but has been changed to 40 mg prednisone twice a day for now.Continue prednisone taper and continue 20 mg daily for long term until she sees rheumatologist.  She is also on pain medications, gabapentin. PT/OT recommended skilled facility on discharge.  Weakness of bilateral lower extremities/blurry vision: Initially stroke was suspected but MRI did not show any acute intracranial abnormalities.  Currently on  aspirin and statin.  Ultrasound of the bilateral lower extremity did not show any DVT.  Hyperglycemia : Watch for hyperglycemia in the setting of prednisone use.  Continue sliding-scale insulin.  Her recent hemoglobin A1c is 5.6.  Fibromyalgia: Continue supportive care.  On gabapentin, nortriptyline  Hypertension: Continue current medications  Hypothyroidism: Continue Synthyroid  GERD: Continue PPI  History of recurrent herpes infection: Takes acyclovir for prophylactic therapy.  Debility/deconditioning: PT/OT recommended skilled nursing facility.  She has a bed availability in a skilled nursing facility on 12/10/2019.     Discharge Diagnoses:  Principal Problem:   Weakness of both lower extremities Active Problems:   Generalized weakness   PMR (polymyalgia rheumatica) (HCC)   GERD (gastroesophageal reflux disease)   Fibromyalgia   Essential hypertension   Hypothyroidism   Ambulatory dysfunction   Accident due to mechanical fall without injury   TIA (transient ischemic attack)    Discharge Instructions  Discharge Instructions    Activity as tolerated - No restrictions   Complete by: As directed    Ambulatory referral to Neurology   Complete by: As directed    Call MD for:  persistant nausea and vomiting   Complete by: As directed    Call MD for:  severe uncontrolled pain   Complete by: As directed    Diet - low sodium heart healthy   Complete by: As directed    Diet Carb Modified   Complete by: As directed    Discharge instructions   Complete by: As directed    1)Monitor blood sugars 2)Follow up with PCP in 7-10 days after discharge. 3)Follow up with neurology in 4 weeks.  Name and number the provider has been attached. 4)Take prescribed medications as instructed 5)Follow up with a rheumatologist as an outpatient   Increase activity slowly  Complete by: As directed    Increase activity slowly   Complete by: As directed      Allergies as of 12/10/2019       Reactions   Iohexol Shortness Of Breath   Pt stopped breathing at [place]. Pt refuses IV dye   Codeine Itching   Morphine And Related Itching   Nitrofuran Derivatives Other (See Comments)   Unknown   Oxycodone-acetaminophen Itching      Medication List    STOP taking these medications   Calcium 600+D High Potency 600-400 MG-UNIT tablet Generic drug: Calcium Carbonate-Vitamin D Replaced by: calcium-vitamin D 500-200 MG-UNIT tablet   traMADol 50 MG tablet Commonly known as: ULTRAM     TAKE these medications   acetaminophen 325 MG tablet Commonly known as: TYLENOL Take 650 mg by mouth in the morning and at bedtime.   acyclovir 200 MG capsule Commonly known as: Zovirax Take 1 capsule (200 mg total) by mouth 2 (two) times daily.   albuterol 108 (90 Base) MCG/ACT inhaler Commonly known as: VENTOLIN HFA Inhale 3 puffs into the lungs every 6 (six) hours as needed for wheezing or shortness of breath.   amLODipine 5 MG tablet Commonly known as: NORVASC Take 1 tablet (5 mg total) by mouth daily.   aspirin 81 MG chewable tablet Chew 1 tablet (81 mg total) by mouth daily.   atorvastatin 20 MG tablet Commonly known as: LIPITOR Take 20 mg by mouth at bedtime.   calcium-vitamin D 500-200 MG-UNIT tablet Commonly known as: OSCAL WITH D Take 1 tablet by mouth daily with breakfast. Replaces: Calcium 600+D High Potency 600-400 MG-UNIT tablet   carboxymethylcellulose 1 % ophthalmic solution Apply 1-4 drops to eye every 2 (two) hours while awake. 3-4 drops in right eye, 1-2 drops in left eye every 2 hours until bedtime.   Docusate Sodium 100 MG capsule Take 100 mg by mouth 2 (two) times daily.   EPINEPHrine 0.3 mg/0.3 mL Soaj injection Commonly known as: EPI-PEN Inject 0.3 mg into the muscle as needed for anaphylaxis.   feeding supplement (GLUCERNA SHAKE) Liqd Take 237 mLs by mouth 3 (three) times daily between meals.   fentaNYL 12 MCG/HR Commonly known as:  [place] 1 patch onto the skin every 3 (three) days.   fluticasone 50 MCG/ACT nasal spray Commonly known as: FLONASE Place 1 spray into both nostrils 2 (two) times daily.   gabapentin 300 MG capsule Commonly known as: NEURONTIN Take 1 capsule (300 mg total) by mouth 3 (three) times daily. What changed:   medication strength  how much to take  when to take this   HYDROcodone-acetaminophen 5-325 MG tablet Commonly known as: NORCO/VICODIN Take 1 tablet by mouth every 8 (eight) hours as needed for up to 5 days for moderate pain.   hydrOXYzine 10 MG tablet Commonly known as: ATARAX/VISTARIL Take 1 tablet (10 mg total) by mouth 2 (two) times daily as needed for itching.   insulin aspart 100 UNIT/ML injection Commonly known as: novoLOG Inject 0-5 Units into the skin at bedtime.   insulin aspart 100 UNIT/ML injection Commonly known as: novoLOG Inject 0-9 Units into the skin 3 (three) times daily with meals.   lactulose 10 GM/15ML solution Commonly known as: CHRONULAC Take 10 g by mouth in the morning.   levothyroxine 88 MCG tablet Commonly known as: SYNTHROID Take 88 mcg by mouth daily before breakfast. 30 minutes prior to meal or patient will not eat meal   loratadine 10 MG tablet Commonly  known as: CLARITIN Take 10 mg by mouth daily.   metoprolol tartrate 25 MG tablet Commonly known as: LOPRESSOR Take 1 tablet (25 mg total) by mouth 2 (two) times daily. What changed: how much to take   nortriptyline 25 MG capsule Commonly known as: PAMELOR Take 25 mg by mouth at bedtime.   omeprazole 20 MG capsule Commonly known as: PRILOSEC Take 20 mg by mouth 2 (two) times daily.   predniSONE 20 MG tablet Commonly known as: DELTASONE Take 2 tablets (40 mg total) by mouth 2 (two) times daily with a meal. Instruction: Take 2 pills 2 times daily for 7 days then 2 pills daily for 7 days then continue talking 1 pill( 20 mg ) daily       Follow-up Information    D'Silva,  Belva Crome, MD Follow up.   Specialty: Internal Medicine Contact information: [place] [address] [phone]        Phillips Odor, MD Follow up.   Specialty: Neurology Contact information: [address] [place] [address] [phone]              Allergies  Allergen Reactions   Iohexol Shortness Of Breath    Pt stopped breathing at [place]. Pt refuses IV dye   Codeine Itching   Morphine And Related Itching   Nitrofuran Derivatives Other (See Comments)    Unknown   Oxycodone-Acetaminophen Itching    Consultations:  Neurology   Procedures/Studies: CT Head Wo Contrast  Result Date: 11/22/2019 CLINICAL DATA:  Generalized weakness. EXAM: CT HEAD WITHOUT CONTRAST TECHNIQUE: Contiguous axial images were obtained from the base of the skull through the vertex without intravenous contrast. COMPARISON:  None. FINDINGS: Brain: There is mild cerebral atrophy with widening of the extra-axial spaces and ventricular dilatation. There are areas of decreased attenuation within the white matter tracts of the supratentorial brain, consistent with microvascular disease changes. Vascular: No hyperdense vessel or unexpected calcification. Skull: Negative for acute fracture or focal lesion. Sinuses/Orbits: Chronic deformities are seen along the medial walls of the bilateral orbits. The paranasal sinuses are otherwise normal in appearance. Other: None. IMPRESSION: 1. Generalized cerebral atrophy. 2. No acute intracranial abnormality. Electronically Signed   By: Virgina Norfolk M.D.   On: 11/22/2019 23:52   CT Cervical Spine Wo Contrast  Result Date: 11/23/2019 CLINICAL DATA:  Initial evaluation for weakness, fall, mid back pain. EXAM: CT CERVICAL, THORACIC, AND LUMBAR SPINE WITHOUT CONTRAST TECHNIQUE: Multidetector CT imaging of the cervical, thoracic and lumbar spine was performed without intravenous contrast. Multiplanar CT image reconstructions were  also generated. COMPARISON:  Prior CT from 03/21/2010. FINDINGS: CT CERVICAL SPINE FINDINGS Alignment: Straightening of the normal cervical lordosis. Trace anterolisthesis of C7 on T1, likely chronic and facet mediated. No malalignment. Skull base and vertebrae: Skull base intact. Normal C1-2 articulations are preserved in the dens is intact. Vertebral body heights maintained. No acute fracture. Soft tissues and spinal canal: Soft tissues of the neck demonstrate no acute finding. No abnormal prevertebral edema. Spinal canal within normal limits. Vascular calcifications about the carotid bifurcations. Disc levels: Prominent degenerative changes noted about the C1-2 articulation. Mild cervical spondylosis noted at C4-5 through C6-7 without significant stenosis. Moderate multilevel left-sided facet hypertrophy. Upper chest: Visualized upper chest demonstrates no acute finding. Partially visualized lung apices are clear. Other: None. CT THORACIC SPINE FINDINGS Alignment: Mild scoliosis. Alignment otherwise normal with preservation of the normal thoracic kyphosis. No listhesis or malalignment. Vertebrae: Compression deformity involving what is  favored to be the T5 vertebral body with up to 50% height loss without significant bony retropulsion, chronic in appearance. Vertebral body height otherwise maintained without acute fracture. No discrete or worrisome osseous lesions. Visualized ribs are grossly intact. Paraspinal and other soft tissues: Paraspinous soft tissues within normal limits. Partially visualized lungs are grossly clear. Aortic atherosclerosis. Disc levels: No significant disc pathology seen within the thoracic spine. No appreciable stenosis. CT LUMBAR SPINE FINDINGS Segmentation: Standard. Alignment: Trace dextroscoliosis. Alignment otherwise normal with preservation of the normal lumbar lordosis. No listhesis or malalignment. Vertebrae: Vertebral body height maintained without acute or chronic fracture.  Visualized sacrum and pelvis intact. SI joints approximated symmetric. No worrisome osseous lesions. Paraspinal and other soft tissues: Paraspinous soft tissues demonstrate no acute finding. Aortic atherosclerosis. Diverticulosis noted about the partially visualized sigmoid colon. Visualized visceral structures otherwise unremarkable. Disc levels: L1-2:  Unremarkable. L2-3:  Unremarkable. L3-4: No significant disc bulge. Mild facet hypertrophy. No stenosis. L4-5: Minimal disc bulge. Bilateral facet hypertrophy. No significant stenosis. L5-S1: Degenerative intervertebral disc space narrowing with diffuse disc bulge and reactive endplate change. Moderate facet hypertrophy. No significant spinal stenosis. Mild bilateral L5 foraminal narrowing. IMPRESSION: 1. No acute traumatic injury within the cervical, thoracic, or lumbar spine. 2. 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 3. 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 4.  Aortic Atherosclerosis (ICD10-I70.0). Electronically Signed   By: Jeannine Boga M.D.   On: 11/23/2019 05:49   CT Thoracic Spine Wo Contrast  Result Date: 11/23/2019 CLINICAL DATA:  Initial evaluation for weakness, fall, mid back pain. EXAM: CT CERVICAL, THORACIC, AND LUMBAR SPINE WITHOUT CONTRAST TECHNIQUE: Multidetector CT imaging of the cervical, thoracic and lumbar spine was performed without intravenous contrast. Multiplanar CT image reconstructions were also generated. COMPARISON:  Prior CT from 03/21/2010. FINDINGS: CT CERVICAL SPINE FINDINGS Alignment: Straightening of the normal cervical lordosis. Trace anterolisthesis of C7 on T1, likely chronic and facet mediated. No malalignment. Skull base and vertebrae: Skull base intact. Normal C1-2 articulations are preserved in the dens is intact. Vertebral body heights maintained. No acute fracture. Soft tissues and spinal canal: Soft tissues of  the neck demonstrate no acute finding. No abnormal prevertebral edema. Spinal canal within normal limits. Vascular calcifications about the carotid bifurcations. Disc levels: Prominent degenerative changes noted about the C1-2 articulation. Mild cervical spondylosis noted at C4-5 through C6-7 without significant stenosis. Moderate multilevel left-sided facet hypertrophy. Upper chest: Visualized upper chest demonstrates no acute finding. Partially visualized lung apices are clear. Other: None. CT THORACIC SPINE FINDINGS Alignment: Mild scoliosis. Alignment otherwise normal with preservation of the normal thoracic kyphosis. No listhesis or malalignment. Vertebrae: Compression deformity involving what is favored to be the T5 vertebral body with up to 50% height loss without significant bony retropulsion, chronic in appearance. Vertebral body height otherwise maintained without acute fracture. No discrete or worrisome osseous lesions. Visualized ribs are grossly intact. Paraspinal and other soft tissues: Paraspinous soft tissues within normal limits. Partially visualized lungs are grossly clear. Aortic atherosclerosis. Disc levels: No significant disc pathology seen within the thoracic spine. No appreciable stenosis. CT LUMBAR SPINE FINDINGS Segmentation: Standard. Alignment: Trace dextroscoliosis. Alignment otherwise normal with preservation of the normal lumbar lordosis. No listhesis or malalignment. Vertebrae: Vertebral body height maintained without acute or chronic fracture. Visualized sacrum and pelvis intact. SI joints approximated symmetric. No worrisome osseous lesions. Paraspinal and other soft tissues: Paraspinous soft tissues demonstrate no acute finding. Aortic atherosclerosis.  Diverticulosis noted about the partially visualized sigmoid colon. Visualized visceral structures otherwise unremarkable. Disc levels: L1-2:  Unremarkable. L2-3:  Unremarkable. L3-4: No significant disc bulge. Mild facet hypertrophy.  No stenosis. L4-5: Minimal disc bulge. Bilateral facet hypertrophy. No significant stenosis. L5-S1: Degenerative intervertebral disc space narrowing with diffuse disc bulge and reactive endplate change. Moderate facet hypertrophy. No significant spinal stenosis. Mild bilateral L5 foraminal narrowing. IMPRESSION: 1. No acute traumatic injury within the cervical, thoracic, or lumbar spine. 2. 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 3. 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 4.  Aortic Atherosclerosis (ICD10-I70.0). Electronically Signed   By: Jeannine Boga M.D.   On: 11/23/2019 05:49   CT Lumbar Spine Wo Contrast  Result Date: 11/23/2019 CLINICAL DATA:  Initial evaluation for weakness, fall, mid back pain. EXAM: CT CERVICAL, THORACIC, AND LUMBAR SPINE WITHOUT CONTRAST TECHNIQUE: Multidetector CT imaging of the cervical, thoracic and lumbar spine was performed without intravenous contrast. Multiplanar CT image reconstructions were also generated. COMPARISON:  Prior CT from 03/21/2010. FINDINGS: CT CERVICAL SPINE FINDINGS Alignment: Straightening of the normal cervical lordosis. Trace anterolisthesis of C7 on T1, likely chronic and facet mediated. No malalignment. Skull base and vertebrae: Skull base intact. Normal C1-2 articulations are preserved in the dens is intact. Vertebral body heights maintained. No acute fracture. Soft tissues and spinal canal: Soft tissues of the neck demonstrate no acute finding. No abnormal prevertebral edema. Spinal canal within normal limits. Vascular calcifications about the carotid bifurcations. Disc levels: Prominent degenerative changes noted about the C1-2 articulation. Mild cervical spondylosis noted at C4-5 through C6-7 without significant stenosis. Moderate multilevel left-sided facet hypertrophy. Upper chest: Visualized upper chest demonstrates no acute finding. Partially  visualized lung apices are clear. Other: None. CT THORACIC SPINE FINDINGS Alignment: Mild scoliosis. Alignment otherwise normal with preservation of the normal thoracic kyphosis. No listhesis or malalignment. Vertebrae: Compression deformity involving what is favored to be the T5 vertebral body with up to 50% height loss without significant bony retropulsion, chronic in appearance. Vertebral body height otherwise maintained without acute fracture. No discrete or worrisome osseous lesions. Visualized ribs are grossly intact. Paraspinal and other soft tissues: Paraspinous soft tissues within normal limits. Partially visualized lungs are grossly clear. Aortic atherosclerosis. Disc levels: No significant disc pathology seen within the thoracic spine. No appreciable stenosis. CT LUMBAR SPINE FINDINGS Segmentation: Standard. Alignment: Trace dextroscoliosis. Alignment otherwise normal with preservation of the normal lumbar lordosis. No listhesis or malalignment. Vertebrae: Vertebral body height maintained without acute or chronic fracture. Visualized sacrum and pelvis intact. SI joints approximated symmetric. No worrisome osseous lesions. Paraspinal and other soft tissues: Paraspinous soft tissues demonstrate no acute finding. Aortic atherosclerosis. Diverticulosis noted about the partially visualized sigmoid colon. Visualized visceral structures otherwise unremarkable. Disc levels: L1-2:  Unremarkable. L2-3:  Unremarkable. L3-4: No significant disc bulge. Mild facet hypertrophy. No stenosis. L4-5: Minimal disc bulge. Bilateral facet hypertrophy. No significant stenosis. L5-S1: Degenerative intervertebral disc space narrowing with diffuse disc bulge and reactive endplate change. Moderate facet hypertrophy. No significant spinal stenosis. Mild bilateral L5 foraminal narrowing. IMPRESSION: 1. No acute traumatic injury within the cervical, thoracic, or lumbar spine. 2. Chronic compression deformity of T5 with up to 50%  height loss without bony retropulsion. 3. 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 4.  Aortic Atherosclerosis (ICD10-I70.0). Electronically Signed   By: Jeannine Boga M.D.   On: 11/23/2019 05:49   MR BRAIN WO  CONTRAST  Result Date: 11/25/2019 CLINICAL DATA:  Transit ischemic attack. EXAM: MRI HEAD WITHOUT CONTRAST TECHNIQUE: Multiplanar, multiecho pulse sequences of the brain and surrounding structures were obtained without intravenous contrast. COMPARISON:  11/22/2019 head CT and prior. FINDINGS: Brain: Moderate cerebral atrophy with ex vacuo dilatation. No diffusion-weighted signal abnormality. No intracranial hemorrhage. Scattered and confluent T2 hyperintensities involving the periventricular/subcortical white matter and brainstem are nonspecific however commonly associated with chronic microvascular ischemic changes. No midline shift, ventriculomegaly or extra-axial fluid collection. No mass lesion. Vascular: Normal flow voids. Skull and upper cervical spine: Normal marrow signal. Sinuses/Orbits: Sequela of bilateral lens replacement. Mild ethmoid sinus mucosal thickening. No mastoid effusion. Other: None. IMPRESSION: No acute intracranial process. Moderate cerebral atrophy. Advanced chronic microvascular ischemic changes. Electronically Signed   By: Primitivo Gauze M.D.   On: 11/25/2019 11:16   US Carotid Bilateral (at [facility] and [facility] only)  Result Date: 11/23/2019 CLINICAL DATA:  Generalized weakness, shakiness and blurred vision. EXAM: BILATERAL CAROTID DUPLEX ULTRASOUND TECHNIQUE: Pearline Cables scale imaging, color Doppler and duplex ultrasound were performed of bilateral carotid and vertebral arteries in the neck. COMPARISON:  None. FINDINGS: Criteria: Quantification of carotid stenosis is based on velocity parameters that correlate the residual internal carotid diameter with NASCET-based stenosis levels, using the diameter of the distal internal carotid  lumen as the denominator for stenosis measurement. The following velocity measurements were obtained: RIGHT ICA:  122/31 cm/sec CCA:  63/78 cm/sec SYSTOLIC ICA/CCA RATIO:  1.3 ECA:  91 cm/sec LEFT ICA:  119/34 cm/sec CCA:  58/8 cm/sec SYSTOLIC ICA/CCA RATIO:  1.9 ECA:  92 cm/sec RIGHT CAROTID ARTERY: There is a mild amount of predominately calcified plaque at right carotid bulb and proximal right ICA. Estimated right ICA stenosis is less than 50%. RIGHT VERTEBRAL ARTERY: Antegrade flow with normal waveform and velocity. LEFT CAROTID ARTERY: Mild amount of partially calcified plaque is present at the level of the carotid bulb. No significant left ICA plaque or evidence of left ICA stenosis. LEFT VERTEBRAL ARTERY: Antegrade flow with normal waveform and velocity. IMPRESSION: Mild amount of plaque at the level of both carotid bulbs and the proximal right ICA. Estimated right ICA stenosis is less than 50%. No evidence of left ICA stenosis in the neck. Electronically Signed   By: Aletta Edouard M.D.   On: 11/23/2019 11:15   US Venous Img Lower Bilateral (DVT)  Result Date: 11/23/2019 CLINICAL DATA:  Lower extremity edema bilaterally EXAM: BILATERAL LOWER EXTREMITY VENOUS DUPLEX ULTRASOUND TECHNIQUE: Gray-scale sonography with graded compression, as well as color Doppler and duplex ultrasound were performed to evaluate the lower extremity deep venous systems from the level of the common femoral vein and including the common femoral, femoral, profunda femoral, popliteal and calf veins including the posterior tibial, peroneal and gastrocnemius veins when visible. The superficial great saphenous vein was also interrogated. Spectral Doppler was utilized to evaluate flow at rest and with distal augmentation maneuvers in the common femoral, femoral and popliteal veins. COMPARISON:  None. FINDINGS: RIGHT LOWER EXTREMITY Common Femoral Vein: No evidence of thrombus. Normal compressibility, respiratory phasicity and response  to augmentation. Saphenofemoral Junction: No evidence of thrombus. Normal compressibility and flow on color Doppler imaging. Profunda Femoral Vein: No evidence of thrombus. Normal compressibility and flow on color Doppler imaging. Femoral Vein: No evidence of thrombus. Normal compressibility, respiratory phasicity and response to augmentation. Popliteal Vein: No evidence of thrombus. Normal compressibility, respiratory phasicity and response to augmentation. Calf Veins: No evidence of thrombus. Normal compressibility and flow on  color Doppler imaging. Superficial Great Saphenous Vein: No evidence of thrombus. Normal compressibility. Venous Reflux:  None. Other Findings:  None. LEFT LOWER EXTREMITY Common Femoral Vein: No evidence of thrombus. Normal compressibility, respiratory phasicity and response to augmentation. Saphenofemoral Junction: No evidence of thrombus. Normal compressibility and flow on color Doppler imaging. Profunda Femoral Vein: No evidence of thrombus. Normal compressibility and flow on color Doppler imaging. Femoral Vein: No evidence of thrombus. Normal compressibility, respiratory phasicity and response to augmentation. Popliteal Vein: No evidence of thrombus. Normal compressibility, respiratory phasicity and response to augmentation. Calf Veins: No evidence of thrombus. Normal compressibility and flow on color Doppler imaging. Superficial Great Saphenous Vein: No evidence of thrombus. Normal compressibility. Venous Reflux:  None. Other Findings:  None. IMPRESSION: No evidence of deep venous thrombosis in either lower extremity. Electronically Signed   By: Lowella Grip III M.D.   On: 11/23/2019 10:55   DG Chest Port 1 View  Result Date: 11/23/2019 CLINICAL DATA:  Generalized weakness. EXAM: PORTABLE CHEST 1 VIEW COMPARISON:  04/01/2014 FINDINGS: Heart size and pulmonary vascularity are normal. Emphysematous changes in the lungs. Peribronchial thickening and streaky perihilar opacities  suggesting chronic bronchitis. No focal consolidation. No pleural effusions. No pneumothorax. Mediastinal contours appear intact. Degenerative changes in the spine and shoulders. Old bilateral rib fractures. 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 No evidence of active pulmonary disease. Electronically Signed   By: Lucienne Capers M.D.   On: 11/23/2019 00:17   ECHOCARDIOGRAM COMPLETE  Result Date: 11/23/2019    ECHOCARDIOGRAM REPORT   Patient Name:   [facility] Date of Exam: 11/23/2019 Medical Rec #:  [id number]    Height:       63.0 in Accession #:    [id number]   Weight:       161.0 lb Date of Birth:  March 24, 1940     BSA:          1.763 m Patient Age:    64 years     BP:           158/70 mmHg Patient Gender: F            HR:           73 bpm. Exam Location:  [facility] Procedure: 2D Echo Indications:    TIA G45.9  History:        Patient has no prior history of Echocardiogram examinations.                 Risk Factors:Dyslipidemia and Diabetes.  Sonographer:    Mikki Santee RDCS (AE) Referring Phys: [id number] [place]  1. Left ventricular ejection fraction, by estimation, is 60 to 65%. The left ventricle has normal function. The left ventricle has no regional wall motion abnormalities. There is severe left ventricular hypertrophy of the basal-septal segment. Left ventricular diastolic parameters are consistent with Grade I diastolic dysfunction (impaired relaxation). Elevated left ventricular end-diastolic pressure.  2. Right ventricular systolic function is mildly reduced. The right ventricular size is normal. Tricuspid regurgitation signal is inadequate for assessing PA pressure.  3. The mitral valve is normal in structure. Trivial mitral valve regurgitation. No evidence of mitral stenosis.  4. The aortic valve is tricuspid. Aortic valve regurgitation is trivial. Mild to moderate aortic valve sclerosis/calcification is present, without any evidence of aortic  stenosis.  5. The inferior vena cava is normal in size with greater than 50% respiratory variability, suggesting right atrial pressure of 3 mmHg. FINDINGS  Left Ventricle: Left ventricular ejection fraction, by estimation, is 60 to 65%. The left ventricle has normal function. The left ventricle has no regional wall motion abnormalities. The left ventricular internal cavity size was normal in size. There is  severe left ventricular hypertrophy of the basal-septal segment. Left ventricular diastolic parameters are consistent with Grade I diastolic dysfunction (impaired relaxation). Elevated left ventricular end-diastolic pressure. Right Ventricle: The right ventricular size is normal. No increase in right ventricular wall thickness. Right ventricular systolic function is mildly reduced. Tricuspid regurgitation signal is inadequate for assessing PA pressure. Left Atrium: Left atrial size was normal in size. Right Atrium: Right atrial size was normal in size. Pericardium: There is no evidence of pericardial effusion. Mitral Valve: The mitral valve is normal in structure. Trivial mitral valve regurgitation. No evidence of mitral valve stenosis. Tricuspid Valve: The tricuspid valve is normal in structure. Tricuspid valve regurgitation is trivial. No evidence of tricuspid stenosis. Aortic Valve: The aortic valve is tricuspid. Aortic valve regurgitation is trivial. Mild to moderate aortic valve sclerosis/calcification is present, without any evidence of aortic stenosis. Pulmonic Valve: The pulmonic valve was normal in structure. Pulmonic valve regurgitation is not visualized. No evidence of pulmonic stenosis. Aorta: The aortic root is normal in size and structure. Venous: The inferior vena cava is normal in size with greater than 50% respiratory variability, suggesting right atrial pressure of 3 mmHg. IAS/Shunts: No atrial level shunt detected by color flow Doppler.  LEFT VENTRICLE PLAX 2D LVIDd:         3.66 cm  Diastology  LVIDs:         2.48 cm  LV e' medial:    5.45 cm/s LV PW:         1.10 cm  LV E/e' medial:  16.7 LV IVS:        1.66 cm  LV e' lateral:   5.00 cm/s LVOT diam:     2.10 cm  LV E/e' lateral: 18.2 LV SV:         87 LV SV Index:   49 LVOT Area:     3.46 cm  RIGHT VENTRICLE RV S prime:     8.49 cm/s TAPSE (M-mode): 1.5 cm LEFT ATRIUM             Index       RIGHT ATRIUM           Index LA diam:        3.70 cm 2.10 cm/m  RA Area:     10.20 cm LA Vol (A2C):   58.9 ml 33.40 ml/m RA Volume:   21.90 ml  12.42 ml/m LA Vol (A4C):   38.2 ml 21.66 ml/m LA Biplane Vol: 48.8 ml 27.67 ml/m  AORTIC VALVE LVOT Vmax:   96.70 cm/s LVOT Vmean:  67.900 cm/s LVOT VTI:    0.252 m  AORTA Ao Root diam: 2.60 cm MITRAL VALVE MV Area (PHT): 3.08 cm     SHUNTS MV Decel Time: 246 msec     Systemic VTI:  0.25 m MV E velocity: 91.05 cm/s   Systemic Diam: 2.10 cm MV A velocity: 101.15 cm/s MV E/A ratio:  0.90 Fransico Him MD Electronically signed by Fransico Him MD Signature Date/Time: 11/23/2019/4:17:40 PM    Final       Subjective: Patient seen and examined at the bedside this morning.  Medically stable for discharge today to skilled nursing  facility.  Discharge Exam: Vitals:   12/10/19 0559 12/10/19 0654  BP: Marland Kitchen)  163/79 (!) 146/68  Pulse: 71 71  Resp: 16 20  Temp: 98.3 F (36.8 C) 98.6 F (37 C)  SpO2: 100% 100%   Vitals:   12/09/19 1420 12/09/19 1958 12/10/19 0559 12/10/19 0654  BP: (!) 159/82 (!) 156/72 (!) 163/79 (!) 146/68  Pulse: 80 75 71 71  Resp: 18 20 16 20   Temp: 99.2 F (37.3 C) 98.6 F (37 C) 98.3 F (36.8 C) 98.6 F (37 C)  TempSrc: Oral Oral Oral   SpO2: 100% 99% 100% 100%  Weight:      Height:        General: Pt is alert, awake, not in acute distress Cardiovascular: RRR, S1/S2 +, no rubs, no gallops Respiratory: CTA bilaterally, no wheezing, no rhonchi Abdominal: Soft, NT, ND, bowel sounds + Extremities: no edema, no cyanosis    The results of significant diagnostics from this  hospitalization (including imaging, microbiology, ancillary and laboratory) are listed below for reference.     Microbiology: Recent Results (from the past 240 hour(s))  SARS Coronavirus 2 by RT PCR (hospital order, performed in [facility] hospital lab) Nasopharyngeal Nasopharyngeal Swab     Status: None   Collection Time: 12/03/19  1:33 PM   Specimen: Nasopharyngeal Swab  Result Value Ref Range Status   SARS Coronavirus 2 NEGATIVE NEGATIVE Final    Comment: (NOTE) SARS-CoV-2 target nucleic acids are NOT DETECTED.  The SARS-CoV-2 RNA is generally detectable in upper and lower respiratory specimens during the acute phase of infection. The lowest concentration of SARS-CoV-2 viral copies this assay can detect is 250 copies / mL. A negative result does not preclude SARS-CoV-2 infection and should not be used as the sole basis for treatment or other patient management decisions.  A negative result may occur with improper specimen collection / handling, submission of specimen other than nasopharyngeal swab, presence of viral mutation(s) within the areas targeted by this assay, and inadequate number of viral copies (<250 copies / mL). A negative result must be combined with clinical observations, patient history, and epidemiological information.  Fact Sheet for Patients:   StrictlyIdeas.no  Fact Sheet for Healthcare Providers: BankingDealers.co.za  This test is not yet approved or  cleared by the [place] FDA and has been authorized for detection and/or diagnosis of SARS-CoV-2 by FDA under an Emergency Use Authorization (EUA).  This EUA will remain in effect (meaning this test can be used) for the duration of the COVID-19 declaration under Section 564(b)(1) of the Act, 21 U.S.C. section 360bbb-3(b)(1), unless the authorization is terminated or revoked sooner.  Performed at [facility], [address] [place], [place] [address]    Respiratory Panel by RT PCR (Flu A&B, Covid) - Nasopharyngeal Swab     Status: None   Collection Time: 12/09/19  8:12 AM   Specimen: Nasopharyngeal Swab  Result Value Ref Range Status   SARS Coronavirus 2 by RT PCR NEGATIVE NEGATIVE Final    Comment: (NOTE) SARS-CoV-2 target nucleic acids are NOT DETECTED.  The SARS-CoV-2 RNA is generally detectable in upper respiratoy specimens during the acute phase of infection. The lowest concentration of SARS-CoV-2 viral copies this assay can detect is 131 copies/mL. A negative result does not preclude SARS-Cov-2 infection and should not be used as the sole basis for treatment or other patient management decisions. A negative result may occur with  improper specimen collection/handling, submission of specimen other than nasopharyngeal swab, presence of viral mutation(s) within the areas targeted by this assay, and inadequate number of viral  copies (<131 copies/mL). A negative result must be combined with clinical observations, patient history, and epidemiological information. The expected result is Negative.  Fact Sheet for Patients:  PinkCheek.be  Fact Sheet for Healthcare Providers:  GravelBags.it  This test is no t yet approved or cleared by the [place] FDA and  has been authorized for detection and/or diagnosis of SARS-CoV-2 by FDA under an Emergency Use Authorization (EUA). This EUA will remain  in effect (meaning this test can be used) for the duration of the COVID-19 declaration under Section 564(b)(1) of the Act, 21 U.S.C. section 360bbb-3(b)(1), unless the authorization is terminated or revoked sooner.     Influenza A by PCR NEGATIVE NEGATIVE Final   Influenza B by PCR NEGATIVE NEGATIVE Final    Comment: (NOTE) The Xpert Xpress SARS-CoV-2/FLU/RSV assay is intended as an aid in  the diagnosis of influenza from Nasopharyngeal swab specimens and  should not be used as  a sole basis for treatment. Nasal washings and  aspirates are unacceptable for Xpert Xpress SARS-CoV-2/FLU/RSV  testing.  Fact Sheet for Patients: PinkCheek.be  Fact Sheet for Healthcare Providers: GravelBags.it  This test is not yet approved or cleared by the [place] FDA and  has been authorized for detection and/or diagnosis of SARS-CoV-2 by  FDA under an Emergency Use Authorization (EUA). This EUA will remain  in effect (meaning this test can be used) for the duration of the  Covid-19 declaration under Section 564(b)(1) of the Act, 21  U.S.C. section 360bbb-3(b)(1), unless the authorization is  terminated or revoked. Performed at [facility], [address], [place], [place] [address]      Labs: BNP (last 3 results) No results for input(s): BNP in the last 8760 hours. Basic Metabolic Panel: Recent Labs  Lab 12/06/19 0648 12/07/19 0647 12/09/19 1344  NA 137  --  136  K 3.9  --  4.1  CL 99  --  98  CO2 29  --  30  GLUCOSE 115*  --  220*  BUN 27*  --  32*  CREATININE 1.16* 1.07* 1.18*  CALCIUM 9.3  --  8.9   Liver Function Tests: No results for input(s): AST, ALT, ALKPHOS, BILITOT, PROT, ALBUMIN in the last 168 hours. No results for input(s): LIPASE, AMYLASE in the last 168 hours. No results for input(s): AMMONIA in the last 168 hours. CBC: Recent Labs  Lab 12/06/19 0648 12/09/19 1344  WBC 16.0* 19.5*  NEUTROABS 12.1* 17.5*  HGB 13.2 12.0  HCT 42.2 37.8  MCV 95.9 96.7  PLT 337 242   Cardiac Enzymes: No results for input(s): CKTOTAL, CKMB, CKMBINDEX, TROPONINI in the last 168 hours. BNP: Invalid input(s): POCBNP CBG: Recent Labs  Lab 12/09/19 0819 12/09/19 1229 12/09/19 1657 12/09/19 1956 12/10/19 0704  GLUCAP 104* 171* 147* 280* 131*   D-Dimer No results for input(s): DDIMER in the last 72 hours. Hgb A1c No results for input(s): HGBA1C in the last 72 hours. Lipid Profile No results  for input(s): CHOL, HDL, LDLCALC, TRIG, CHOLHDL, LDLDIRECT in the last 72 hours. Thyroid function studies No results for input(s): TSH, T4TOTAL, T3FREE, THYROIDAB in the last 72 hours.  Invalid input(s): FREET3 Anemia work up No results for input(s): VITAMINB12, FOLATE, FERRITIN, TIBC, IRON, RETICCTPCT in the last 72 hours. Urinalysis    Component Value Date/Time   COLORURINE YELLOW 12/29/2010 1210   APPEARANCEUR CLEAR 12/29/2010 1210   LABSPEC 1.030 12/29/2010 1210   PHURINE 6.0 12/29/2010 1210   GLUCOSEU NEGATIVE 12/29/2010 1210  HGBUR NEGATIVE 12/29/2010 1210   BILIRUBINUR small (A) 11/22/2019 1426   KETONESUR trace (5) (A) 11/22/2019 1426   KETONESUR 15 (A) 12/29/2010 1210   PROTEINUR =30 (A) 11/22/2019 1426   PROTEINUR 30 (A) 12/29/2010 1210   UROBILINOGEN 0.2 11/22/2019 1426   UROBILINOGEN 0.2 12/29/2010 1210   NITRITE Negative 11/22/2019 1426   NITRITE NEGATIVE 12/29/2010 1210   LEUKOCYTESUR Negative 11/22/2019 1426   Sepsis Labs Invalid input(s): PROCALCITONIN,  WBC,  LACTICIDVEN Microbiology Recent Results (from the past 240 hour(s))  SARS Coronavirus 2 by RT PCR (hospital order, performed in [place] hospital lab) Nasopharyngeal Nasopharyngeal Swab     Status: None   Collection Time: 12/03/19  1:33 PM   Specimen: Nasopharyngeal Swab  Result Value Ref Range Status   SARS Coronavirus 2 NEGATIVE NEGATIVE Final    Comment: (NOTE) SARS-CoV-2 target nucleic acids are NOT DETECTED.  The SARS-CoV-2 RNA is generally detectable in upper and lower respiratory specimens during the acute phase of infection. The lowest concentration of SARS-CoV-2 viral copies this assay can detect is 250 copies / mL. A negative result does not preclude SARS-CoV-2 infection and should not be used as the sole basis for treatment or other patient management decisions.  A negative result may occur with improper specimen collection / handling, submission of specimen other than nasopharyngeal  swab, presence of viral mutation(s) within the areas targeted by this assay, and inadequate number of viral copies (<250 copies / mL). A negative result must be combined with clinical observations, patient history, and epidemiological information.  Fact Sheet for Patients:   StrictlyIdeas.no  Fact Sheet for Healthcare Providers: BankingDealers.co.za  This test is not yet approved or  cleared by the [place] FDA and has been authorized for detection and/or diagnosis of SARS-CoV-2 by FDA under an Emergency Use Authorization (EUA).  This EUA will remain in effect (meaning this test can be used) for the duration of the COVID-19 declaration under Section 564(b)(1) of the Act, 21 U.S.C. section 360bbb-3(b)(1), unless the authorization is terminated or revoked sooner.  Performed at [facility], [address], [place], [place] [address]   Respiratory Panel by RT PCR (Flu A&B, Covid) - Nasopharyngeal Swab     Status: None   Collection Time: 12/09/19  8:12 AM   Specimen: Nasopharyngeal Swab  Result Value Ref Range Status   SARS Coronavirus 2 by RT PCR NEGATIVE NEGATIVE Final    Comment: (NOTE) SARS-CoV-2 target nucleic acids are NOT DETECTED.  The SARS-CoV-2 RNA is generally detectable in upper respiratoy specimens during the acute phase of infection. The lowest concentration of SARS-CoV-2 viral copies this assay can detect is 131 copies/mL. A negative result does not preclude SARS-Cov-2 infection and should not be used as the sole basis for treatment or other patient management decisions. A negative result may occur with  improper specimen collection/handling, submission of specimen other than nasopharyngeal swab, presence of viral mutation(s) within the areas targeted by this assay, and inadequate number of viral copies (<131 copies/mL). A negative result must be combined with clinical observations, patient history, and  epidemiological information. The expected result is Negative.  Fact Sheet for Patients:  PinkCheek.be  Fact Sheet for Healthcare Providers:  GravelBags.it  This test is no t yet approved or cleared by the [place] FDA and  has been authorized for detection and/or diagnosis of SARS-CoV-2 by FDA under an Emergency Use Authorization (EUA). This EUA will remain  in effect (meaning this test can be used) for the  duration of the COVID-19 declaration under Section 564(b)(1) of the Act, 21 U.S.C. section 360bbb-3(b)(1), unless the authorization is terminated or revoked sooner.     Influenza A by PCR NEGATIVE NEGATIVE Final   Influenza B by PCR NEGATIVE NEGATIVE Final    Comment: (NOTE) The Xpert Xpress SARS-CoV-2/FLU/RSV assay is intended as an aid in  the diagnosis of influenza from Nasopharyngeal swab specimens and  should not be used as a sole basis for treatment. Nasal washings and  aspirates are unacceptable for Xpert Xpress SARS-CoV-2/FLU/RSV  testing.  Fact Sheet for Patients: PinkCheek.be  Fact Sheet for Healthcare Providers: GravelBags.it  This test is not yet approved or cleared by the [place] FDA and  has been authorized for detection and/or diagnosis of SARS-CoV-2 by  FDA under an Emergency Use Authorization (EUA). This EUA will remain  in effect (meaning this test can be used) for the duration of the  Covid-19 declaration under Section 564(b)(1) of the Act, 21  U.S.C. section 360bbb-3(b)(1), unless the authorization is  terminated or revoked. Performed at [facility], [address], [place],  [address]     Please note: You were cared for by a hospitalist during your hospital stay. Once you are discharged, your primary care physician will handle any further medical issues. Please note that NO REFILLS for any discharge medications will be  authorized once you are discharged, as it is imperative that you return to your primary care physician (or establish a relationship with a primary care physician if you do not have one) for your post hospital discharge needs so that they can reassess your need for medications and monitor your lab values.    Time coordinating discharge: 40 minutes  SIGNED:   Shelly Coss, MD  [facility] 12/10/2019, 7:46 AM Pager [phone]  If 7PM-7AM, please contact night-coverage [email or web address] Password [email or web address]

## 2019-12-10 NOTE — Progress Notes (Signed)
Report called to Manuela Schwartz, nurse at [employer] [facility] at [facility] for narcotics in transport envelope which EMS took.  Daughter was here and took all a patient's belongings.

## 2019-12-10 NOTE — TOC Transition Note (Signed)
Transition of Care [facility]) - CM/SW Discharge Note   Patient Details  Name: Christina Fry MRN: [id number] Date of Birth: October 11, 1940  [facility] CM/SW Contact:  Iona Beard, [place] Phone Number: 12/10/2019, 9:16 AM   Clinical Narrative:    CSW spoke with Tammy at [facility] to confirm that pt was cleared to arrive today. Tammy informed CSW that she needed medication list and MAR faxed. TOC attempted to fax documents 4 times with no success. CSW scanned documents and sent via email to Tammy at [email or web address]. CSW informed pts daughter that pt would be discharging today and picked up by EMS at [facility] reminded pts daughter to have [place] vaccination card for facility and to arrive around when pt arrives. CSW updated RN, informed to call report to [phone] and ask for the rehab hall and to speak with nurse Gatha Mayer, also informed RN it may take a few tries to get through to facility. CSW updated MD. MD informed CSW of updated discharge summary and med list. CSW scanned updated d/c summary and med list and sent via email to [place]. TOC signing off.    Final next level of care: Skilled Nursing Facility Barriers to Discharge: Barriers Resolved   Patient Goals and CMS Choice Patient states their goals for this hospitalization and ongoing recovery are:: Go to SNF CMS Medicare.gov Compare Post Acute Care list provided to:: Patient Represenative (must comment) (Pts daughter [name]) Choice offered to / list presented to : Adult Children  Discharge Placement   Existing PASRR number confirmed : 11/25/19          Patient chooses bed at:  ([facility]) Patient to be transferred to facility by: EMS Name of family member notified: [name] Patient and family notified of of transfer: 12/10/19  Discharge Plan and Services     Post Acute Care Choice: [place]          DME Arranged: N/A DME Agency: NA        HH Arranged: NA HH Agency: NA        Social Determinants of Health ([place]) Interventions     Readmission Risk Interventions Readmission Risk Prevention Plan 12/06/2019  Transportation Screening Complete  Social Work Consult for [place] Planning/Counseling Complete  Medication Review Press photographer) Complete  Some recent data might be hidden

## 2020-02-14 ENCOUNTER — Telehealth: Payer: Self-pay

## 2020-02-14 NOTE — Telephone Encounter (Signed)
Patient wanted to come in Friday December 10th to start getting her allergy injections again. Her last injection was September 3rd at .5 Red vial. Where should she start?

## 2020-02-17 NOTE — Telephone Encounter (Signed)
Will need to mix down to [facility] Wednesday 02/19/2020.

## 2020-02-17 NOTE — Telephone Encounter (Signed)
Let's restart at Green Vial 0.25 mL and advance on Schedule B.   Salvatore Marvel, MD All[facility] [place] [facility] [place]

## 2020-02-19 NOTE — Telephone Encounter (Signed)
Lonn [place] can you please mix vials down. Thank you.

## 2020-02-19 NOTE — Telephone Encounter (Signed)
Vials mixed down.

## 2020-02-28 ENCOUNTER — Ambulatory Visit (INDEPENDENT_AMBULATORY_CARE_PROVIDER_SITE_OTHER): Payer: No Typology Code available for payment source

## 2020-02-28 DIAGNOSIS — J309 Allergic rhinitis, unspecified: Secondary | ICD-10-CM | POA: Diagnosis not present

## 2020-03-27 ENCOUNTER — Telehealth: Payer: Self-pay | Admitting: Allergy & Immunology

## 2020-03-27 NOTE — Telephone Encounter (Signed)
Sent new request for VA authorization to [facility], [phone]. Called patient to inform her that she is due for a yearly visit with Dr. Ernst Bowler, patient is in rehab and cannot make an appointment right now. Patient requested to go ahead and submit a new referral to have on file.

## 2020-04-06 NOTE — Telephone Encounter (Signed)
Patient made an appointment on 05-04-2020 for a televisit with Dr. Ernst Bowler. Printed reminder and resent VA referral to [facility].

## 2020-04-27 NOTE — Telephone Encounter (Signed)
[facility] to check on referral authorization, states request was received but it was still in review and they will reach out to a nurse.

## 2020-05-04 ENCOUNTER — Ambulatory Visit: Payer: Medicare Other | Admitting: Allergy & Immunology

## 2020-05-12 DEATH — deceased

## 2020-05-22 ENCOUNTER — Ambulatory Visit: Payer: Medicare Other | Admitting: Allergy & Immunology

## 2020-06-10 NOTE — Telephone Encounter (Signed)
[facility] to check on referral authorization, states request was received on 04-06-2020 but it was still in review and they will reach out to a nurse and someone would call back.

## 2020-06-17 ENCOUNTER — Other Ambulatory Visit: Payer: Self-pay

## 2020-06-17 ENCOUNTER — Ambulatory Visit (INDEPENDENT_AMBULATORY_CARE_PROVIDER_SITE_OTHER): Payer: No Typology Code available for payment source | Admitting: Allergy & Immunology

## 2020-06-17 ENCOUNTER — Encounter: Payer: Self-pay | Admitting: Allergy & Immunology

## 2020-06-17 VITALS — BP 140/86 | HR 80 | Temp 98.0°F | Resp 16 | Wt 168.0 lb

## 2020-06-17 DIAGNOSIS — J302 Other seasonal allergic rhinitis: Secondary | ICD-10-CM

## 2020-06-17 DIAGNOSIS — J309 Allergic rhinitis, unspecified: Secondary | ICD-10-CM | POA: Diagnosis not present

## 2020-06-17 DIAGNOSIS — J3089 Other allergic rhinitis: Secondary | ICD-10-CM | POA: Diagnosis not present

## 2020-06-17 DIAGNOSIS — J4520 Mild intermittent asthma, uncomplicated: Secondary | ICD-10-CM | POA: Diagnosis not present

## 2020-06-17 MED ORDER — IPRATROPIUM BROMIDE 0.06 % NA SOLN: 2.0000 | Freq: Three times a day (TID) | NASAL | 5 refills | Status: DC

## 2020-06-17 NOTE — Progress Notes (Addendum)
FOLLOW UP  Date of Service/Encounter:  06/17/20   Assessment:   Mild intermittent asthma without complication  Seasonal and perennial allergic rhinitis(trees, grass, weeds, ragweed, molds, dust mite, cat dog)  GERD  Complicated past medical history, including hypothyroidism, TIAs, and fibromyalgia  Plan/Recommendations:   1. Seasonal and perennial allergic rhinitis - We are restarting the allergy shots today.  - Continue with Nasacort one spray per nostril daily (aim for the ear on each side). - Continue with Claritin 10mg  as needed.  2. Gustatory rhinitis - Start Atrovent one spray per nostril 15 minutes or so before meals.  3. Return in about 6 months (around 12/17/2020).  Subjective:   Christina Fry is a 80 y.o. female presenting today for follow up of  Chief Complaint  Patient presents with  . Asthma    Uses rescue inhaler on the daily   . Allergic Rhinitis     Sneezing, sinus pressure, headaches, dry cough   . Other    Is on prednisone started on late september and is still on it 10mg  per day     Christina Fry has a history of the following: Patient Active Problem List   Diagnosis Date Noted  . Weakness of both lower extremities 11/29/2019  . Generalized weakness 11/23/2019  . PMR (polymyalgia rheumatica) (HCC) 11/23/2019  . GERD (gastroesophageal reflux disease) 11/23/2019  . Fibromyalgia 11/23/2019  . Essential hypertension 11/23/2019  . Hypothyroidism 11/23/2019  . Ambulatory dysfunction 11/23/2019  . Accident due to mechanical fall without injury 11/23/2019  . TIA (transient ischemic attack) 11/23/2019  . Seasonal and perennial allergic rhinitis 04/04/2018  . Mild intermittent asthma without complication 03/55/9741    History obtained from: chart review and patient.  Christina Fry is a 80 y.o. female presenting for a follow up visit.  She was last seen in January 2021.  At that time, we continue with her allergy shots as well as Nasacort and Claritin.   She was endorsing some headaches and we offered a referral to ENT which she declined.  In the interim, she started allergy shots, but then had some injuries and spent a lot of time in rehab. She is now discharged from the rehab until and wants to get allergy shots back on board. She was diagnosed with polymyalgia rheumatica during this time, but then apparently undiagnosed with it. The story is hard to follow, but she was on an extended course of steroids which messed with her blood glucose.   Asthma Symptom History: Christina Fry's asthma has been well controlled. She has not required rescue medication, experienced nocturnal awakenings due to lower respiratory symptoms, nor have activities of daily living been limited. She has required no Emergency Department or Urgent Care visits for her asthma. She has required zero courses of systemic steroids for asthma exacerbations since the last visit. ACT score today is 25, indicating excellent asthma symptom control.   Allergic Rhinitis Symptom History: She did notfele that the shots were all that helpful, but she never made it to the [employer] [employer]. She is very motivated to get back on the allergy shots and get some relief of her symptoms.   She does report that she has a lot of clear rhinorrhea which occurs every time that eats. She carries a tissue around with her at all times. She has not tried using her nose spray to help with this and she has not tried using nasal Atrovent. She is very open to new ideas.   Christina Fry is on  allergen immunotherapy. She receives two injections. Immunotherapy script #1 contains trees, weeds, grasses, cat and dog. She currently receives 0.31mL of the GOLD vial (1/10,000). Immunotherapy script #2 contains molds, dust mites and cockroach. She currently receives 0.79mL of the GOLD vial (1/10,000). She started shots February of 2020 and not yet reached maintenance.   Otherwise, there have been no changes to her past medical history, surgical history,  family history, or social history.    Review of Systems  Constitutional: Negative.  Negative for fever, malaise/fatigue and weight loss.  HENT: Positive for congestion. Negative for ear discharge and ear pain.        Positive for postnasal drip and rhinorrhea.   Eyes: Negative for pain, discharge and redness.  Respiratory: Negative for cough, sputum production, shortness of breath and wheezing.   Cardiovascular: Negative.  Negative for chest pain and palpitations.  Gastrointestinal: Negative for abdominal pain, heartburn, nausea and vomiting.  Skin: Negative.  Negative for itching and rash.  Neurological: Positive for focal weakness and weakness. Negative for dizziness and headaches.  Endo/Heme/Allergies: Negative for environmental allergies. Does not bruise/bleed easily.       Objective:   Blood pressure 140/86, pulse 80, temperature 98 F (36.7 C), resp. rate 16, weight 168 lb (76.2 kg), SpO2 96 %. Body mass index is 29.76 kg/m.   Physical Exam:  Physical Exam Constitutional:      Appearance: She is well-developed.     Comments: Alopecia present.   HENT:     Head: Normocephalic and atraumatic.     Right Ear: Tympanic membrane, ear canal and external ear normal.     Left Ear: Tympanic membrane, ear canal and external ear normal.     Nose: No nasal deformity, septal deviation, mucosal edema or rhinorrhea.     Right Turbinates: Enlarged and swollen.     Left Turbinates: Enlarged and swollen.     Right Sinus: No maxillary sinus tenderness or frontal sinus tenderness.     Left Sinus: No maxillary sinus tenderness or frontal sinus tenderness.     Comments: Clear rhinorrhea bilaterally.     Mouth/Throat:     Mouth: Mucous membranes are not pale and not dry.     Pharynx: Uvula midline.  Eyes:     General:        Right eye: No discharge.        Left eye: No discharge.     Conjunctiva/sclera: Conjunctivae normal.     Right eye: Right conjunctiva is not injected. No  chemosis.    Left eye: Left conjunctiva is not injected. No chemosis.    Pupils: Pupils are equal, round, and reactive to light.  Cardiovascular:     Rate and Rhythm: Normal rate and regular rhythm.     Heart sounds: Normal heart sounds.  Pulmonary:     Effort: Pulmonary effort is normal. No tachypnea, accessory muscle usage or respiratory distress.     Breath sounds: Normal breath sounds. No wheezing, rhonchi or rales.     Comments: Moving air well in all lung fields.  Chest:     Chest wall: No tenderness.  Lymphadenopathy:     Cervical: No cervical adenopathy.  Skin:    General: Skin is warm.     Capillary Refill: Capillary refill takes less than 2 seconds.     Coloration: Skin is not pale.     Findings: No abrasion, erythema, petechiae or rash. Rash is not papular, urticarial or vesicular.  Neurological:  Mental Status: She is alert.  Psychiatric:        Behavior: Behavior is cooperative.      Diagnostic studies: none     Salvatore Marvel, MD  [facility]

## 2020-06-17 NOTE — Telephone Encounter (Signed)
RN Tae emailed me stating, "I see that the SAR was uploaded 02/28/20 but it was not noted on the actual upload that a SAR was being requested - which may be why it was missed. I have alerted Dr. Bonney Leitz to expedite ordering a NEW referral. If she orders it today, I can process it as I am covering URGENT referrals for Allergy clinic today. If the referral is ordered AFTER her appointment date, we can always backdate her new referral. I would not reschedule veteran at this point as it is delaying her care."

## 2020-06-17 NOTE — Telephone Encounter (Signed)
[place] to check on referral status, spoke with the nurse Wyatt Portela and was told she would check with her lead and covering nurse. Informed patient has an appointment today at 3:30pm and she had to cancel two other appointments due to not having authorization. Wyatt Portela said she will try to get an extended authorization and email it. She said to call back if I do not hear anything by this afternoon.

## 2020-06-17 NOTE — Patient Instructions (Addendum)
1. Seasonal and perennial allergic rhinitis - We are restarting the allergy shots today.  - Continue with Nasacort one spray per nostril daily (aim for the ear on each side). - Continue with Claritin 10mg  as needed.  2. Gustatory rhinitis - Start Atrovent one spray per nostril 15 minutes or so before meals. - Otherwise install a bucket under each nostril and drain as needed.  3. Return in about 6 months (around 12/17/2020).    Please inform us of any Emergency Department visits, hospitalizations, or changes in symptoms. Call us before going to the ED for breathing or allergy symptoms since we might be able to fit you in for a sick visit. Feel free to contact us anytime with any questions, problems, or concerns.  It was a pleasure to see you and your family again today!  Websites that have reliable patient information: 1. American Academy of Asthma, Allergy, and Immunology: www.aaaai.org 2. Food Allergy Research and Education (FARE): foodallergy.org 3. Mothers of Asthmatics: http://www.asthmacommunitynetwork.org 4. American College of Allergy, Asthma, and Immunology: www.acaai.org   COVID-19 Vaccine Information can be found at: ShippingScam.co.uk For questions related to vaccine distribution or appointments, please email vaccine@[email or web address] [email or web address] or call [phone].   We realize that you might be concerned about having an allergic reaction to the COVID19 vaccines. To help with that concern, WE ARE OFFERING THE COVID19 VACCINES IN OUR OFFICE! Ask the front desk for dates!     "Like" [place] on Facebook and Instagram for our latest updates!      A healthy democracy works best when [employer] participate! Make sure you are registered to vote! If you have moved or changed any of your contact information, you will need to get this updated before voting!  In some cases, you MAY be able to register to vote online:  [email or web address]

## 2020-06-18 ENCOUNTER — Encounter: Payer: Self-pay | Admitting: Allergy & Immunology

## 2020-06-18 MED ORDER — TRIAMCINOLONE ACETONIDE 55 MCG/ACT NA AERO: 2.0000 | INHALATION_SPRAY | Freq: Every day | NASAL | 2 refills | Status: DC

## 2020-06-18 MED ORDER — LORATADINE 10 MG PO TABS: 10.0000 mg | ORAL_TABLET | Freq: Every day | ORAL | 2 refills | Status: DC | PRN

## 2020-06-18 MED ORDER — IPRATROPIUM BROMIDE 0.06 % NA SOLN
2.0000 | Freq: Three times a day (TID) | NASAL | 5 refills | Status: DC
Start: 2020-06-18 — End: 2021-05-12

## 2020-06-24 ENCOUNTER — Ambulatory Visit (INDEPENDENT_AMBULATORY_CARE_PROVIDER_SITE_OTHER): Payer: No Typology Code available for payment source

## 2020-06-24 DIAGNOSIS — J309 Allergic rhinitis, unspecified: Secondary | ICD-10-CM

## 2020-06-29 ENCOUNTER — Telehealth: Payer: Self-pay

## 2020-06-29 MED ORDER — BUDESONIDE 32 MCG/ACT NA SUSP: NASAL | 5 refills | Status: AC

## 2020-06-29 NOTE — Addendum Note (Signed)
Addended by: Larence Penning on: 06/29/2020 01:25 PM   Modules accepted: Orders

## 2020-06-29 NOTE — Telephone Encounter (Signed)
The [facility] called they would like you to change the prescription from the Nasacort to the Rhinocort. They do not have the Nasacort available. Please advise.

## 2020-06-29 NOTE — Telephone Encounter (Signed)
Sounds good - I am fine with changing to Rhinocort.   Salvatore Marvel

## 2020-07-01 ENCOUNTER — Ambulatory Visit (INDEPENDENT_AMBULATORY_CARE_PROVIDER_SITE_OTHER): Payer: No Typology Code available for payment source | Admitting: *Deleted

## 2020-07-01 DIAGNOSIS — J309 Allergic rhinitis, unspecified: Secondary | ICD-10-CM | POA: Diagnosis not present

## 2020-07-08 ENCOUNTER — Ambulatory Visit (INDEPENDENT_AMBULATORY_CARE_PROVIDER_SITE_OTHER): Payer: No Typology Code available for payment source

## 2020-07-08 DIAGNOSIS — J309 Allergic rhinitis, unspecified: Secondary | ICD-10-CM

## 2020-07-22 ENCOUNTER — Ambulatory Visit (INDEPENDENT_AMBULATORY_CARE_PROVIDER_SITE_OTHER): Payer: No Typology Code available for payment source

## 2020-07-22 DIAGNOSIS — J309 Allergic rhinitis, unspecified: Secondary | ICD-10-CM

## 2020-07-31 ENCOUNTER — Ambulatory Visit (INDEPENDENT_AMBULATORY_CARE_PROVIDER_SITE_OTHER): Payer: No Typology Code available for payment source

## 2020-07-31 DIAGNOSIS — J309 Allergic rhinitis, unspecified: Secondary | ICD-10-CM

## 2020-08-26 ENCOUNTER — Ambulatory Visit: Payer: Self-pay

## 2020-08-26 ENCOUNTER — Emergency Department (HOSPITAL_COMMUNITY): Payer: No Typology Code available for payment source

## 2020-08-26 ENCOUNTER — Inpatient Hospital Stay (HOSPITAL_COMMUNITY)
Admission: EM | Admit: 2020-08-26 | Discharge: 2020-08-28 | DRG: 312 | Disposition: A | Discharge status: Home / Self Care | Payer: No Typology Code available for payment source | Attending: Internal Medicine | Admitting: Internal Medicine

## 2020-08-26 ENCOUNTER — Other Ambulatory Visit: Payer: Self-pay

## 2020-08-26 ENCOUNTER — Encounter (HOSPITAL_COMMUNITY): Payer: Self-pay

## 2020-08-26 DIAGNOSIS — I2782 Chronic pulmonary embolism: Secondary | ICD-10-CM | POA: Diagnosis present

## 2020-08-26 DIAGNOSIS — E785 Hyperlipidemia, unspecified: Secondary | ICD-10-CM | POA: Diagnosis present

## 2020-08-26 DIAGNOSIS — Z90710 Acquired absence of both cervix and uterus: Secondary | ICD-10-CM

## 2020-08-26 DIAGNOSIS — E119 Type 2 diabetes mellitus without complications: Secondary | ICD-10-CM | POA: Diagnosis present

## 2020-08-26 DIAGNOSIS — Z7982 Long term (current) use of aspirin: Secondary | ICD-10-CM

## 2020-08-26 DIAGNOSIS — Z9049 Acquired absence of other specified parts of digestive tract: Secondary | ICD-10-CM

## 2020-08-26 DIAGNOSIS — B379 Candidiasis, unspecified: Secondary | ICD-10-CM | POA: Diagnosis present

## 2020-08-26 DIAGNOSIS — R55 Syncope and collapse: Secondary | ICD-10-CM | POA: Diagnosis not present

## 2020-08-26 DIAGNOSIS — R1310 Dysphagia, unspecified: Secondary | ICD-10-CM | POA: Diagnosis present

## 2020-08-26 DIAGNOSIS — F419 Anxiety disorder, unspecified: Secondary | ICD-10-CM | POA: Diagnosis present

## 2020-08-26 DIAGNOSIS — Z7901 Long term (current) use of anticoagulants: Secondary | ICD-10-CM

## 2020-08-26 DIAGNOSIS — I2699 Other pulmonary embolism without acute cor pulmonale: Secondary | ICD-10-CM | POA: Diagnosis present

## 2020-08-26 DIAGNOSIS — J4520 Mild intermittent asthma, uncomplicated: Secondary | ICD-10-CM | POA: Diagnosis present

## 2020-08-26 DIAGNOSIS — K219 Gastro-esophageal reflux disease without esophagitis: Secondary | ICD-10-CM | POA: Diagnosis present

## 2020-08-26 DIAGNOSIS — Z885 Allergy status to narcotic agent status: Secondary | ICD-10-CM

## 2020-08-26 DIAGNOSIS — D509 Iron deficiency anemia, unspecified: Secondary | ICD-10-CM | POA: Diagnosis present

## 2020-08-26 DIAGNOSIS — Z66 Do not resuscitate: Secondary | ICD-10-CM | POA: Diagnosis present

## 2020-08-26 DIAGNOSIS — I951 Orthostatic hypotension: Secondary | ICD-10-CM | POA: Diagnosis not present

## 2020-08-26 DIAGNOSIS — M797 Fibromyalgia: Secondary | ICD-10-CM | POA: Diagnosis present

## 2020-08-26 DIAGNOSIS — Z79899 Other long term (current) drug therapy: Secondary | ICD-10-CM

## 2020-08-26 DIAGNOSIS — Z20822 Contact with and (suspected) exposure to covid-19: Secondary | ICD-10-CM | POA: Diagnosis present

## 2020-08-26 DIAGNOSIS — I10 Essential (primary) hypertension: Secondary | ICD-10-CM | POA: Diagnosis present

## 2020-08-26 DIAGNOSIS — E0500 Thyrotoxicosis with diffuse goiter without thyrotoxic crisis or storm: Secondary | ICD-10-CM | POA: Diagnosis present

## 2020-08-26 DIAGNOSIS — E039 Hypothyroidism, unspecified: Secondary | ICD-10-CM | POA: Diagnosis present

## 2020-08-26 DIAGNOSIS — Z8673 Personal history of transient ischemic attack (TIA), and cerebral infarction without residual deficits: Secondary | ICD-10-CM

## 2020-08-26 DIAGNOSIS — N179 Acute kidney failure, unspecified: Secondary | ICD-10-CM | POA: Diagnosis present

## 2020-08-26 DIAGNOSIS — Z79890 Hormone replacement therapy (postmenopausal): Secondary | ICD-10-CM

## 2020-08-26 DIAGNOSIS — Z794 Long term (current) use of insulin: Secondary | ICD-10-CM

## 2020-08-26 DIAGNOSIS — M353 Polymyalgia rheumatica: Secondary | ICD-10-CM | POA: Diagnosis present

## 2020-08-26 DIAGNOSIS — Z888 Allergy status to other drugs, medicaments and biological substances status: Secondary | ICD-10-CM

## 2020-08-26 DIAGNOSIS — J189 Pneumonia, unspecified organism: Secondary | ICD-10-CM | POA: Diagnosis present

## 2020-08-26 LAB — CBC WITH DIFFERENTIAL/PLATELET
Abs Immature Granulocytes: 0.19 10*3/uL — ABNORMAL HIGH (ref 0.00–0.07)
Basophils Absolute: 0.1 10*3/uL (ref 0.0–0.1)
Basophils Relative: 1 %
Eosinophils Absolute: 0.2 10*3/uL (ref 0.0–0.5)
Eosinophils Relative: 1 %
HCT: 32.3 % — ABNORMAL LOW (ref 36.0–46.0)
Hemoglobin: 9.6 g/dL — ABNORMAL LOW (ref 12.0–15.0)
Immature Granulocytes: 1 %
Lymphocytes Relative: 22 %
Lymphs Abs: 3.5 10*3/uL (ref 0.7–4.0)
MCH: 30 pg (ref 26.0–34.0)
MCHC: 29.7 g/dL — ABNORMAL LOW (ref 30.0–36.0)
MCV: 100.9 fL — ABNORMAL HIGH (ref 80.0–100.0)
Monocytes Absolute: 1 10*3/uL (ref 0.1–1.0)
Monocytes Relative: 6 %
Neutro Abs: 10.9 10*3/uL — ABNORMAL HIGH (ref 1.7–7.7)
Neutrophils Relative %: 69 %
Platelets: 346 10*3/uL (ref 150–400)
RBC: 3.2 MIL/uL — ABNORMAL LOW (ref 3.87–5.11)
RDW: 17.5 % — ABNORMAL HIGH (ref 11.5–15.5)
WBC: 15.9 10*3/uL — ABNORMAL HIGH (ref 4.0–10.5)
nRBC: 0 % (ref 0.0–0.2)

## 2020-08-26 LAB — COMPREHENSIVE METABOLIC PANEL
ALT: 12 U/L (ref 0–44)
AST: 17 U/L (ref 15–41)
Albumin: 3.6 g/dL (ref 3.5–5.0)
Alkaline Phosphatase: 65 U/L (ref 38–126)
Anion gap: 7 (ref 5–15)
BUN: 24 mg/dL — ABNORMAL HIGH (ref 8–23)
CO2: 29 mmol/L (ref 22–32)
Calcium: 9 mg/dL (ref 8.9–10.3)
Chloride: 101 mmol/L (ref 98–111)
Creatinine, Ser: 1.93 mg/dL — ABNORMAL HIGH (ref 0.44–1.00)
GFR, Estimated: 26 mL/min — ABNORMAL LOW (ref >60)
Glucose, Bld: 122 mg/dL — ABNORMAL HIGH (ref 70–99)
Potassium: 4.2 mmol/L (ref 3.5–5.1)
Sodium: 137 mmol/L (ref 135–145)
Total Bilirubin: 0.3 mg/dL (ref 0.3–1.2)
Total Protein: 6.2 g/dL — ABNORMAL LOW (ref 6.5–8.1)

## 2020-08-26 LAB — CBG MONITORING, ED: Glucose-Capillary: 125 mg/dL — ABNORMAL HIGH (ref 70–99)

## 2020-08-26 LAB — RESP PANEL BY RT-PCR (FLU A&B, COVID) ARPGX2
Influenza A by PCR: NEGATIVE
Influenza B by PCR: NEGATIVE
SARS Coronavirus 2 by RT PCR: NEGATIVE

## 2020-08-26 LAB — TROPONIN I (HIGH SENSITIVITY)
Troponin I (High Sensitivity): 6 ng/L (ref <18)
Troponin I (High Sensitivity): 5 ng/L (ref <18)

## 2020-08-26 LAB — LIPASE, BLOOD: Lipase: 25 U/L (ref 11–51)

## 2020-08-26 IMAGING — DX DG CHEST 1V PORT
1 series · 1 of 1 positions shown · non-contrast
Comparison: Chest radiograph dated [DATE].

CLINICAL DATA: 80-year-old female with near syncope.

EXAM:
PORTABLE CHEST 1 VIEW

[chest ap]
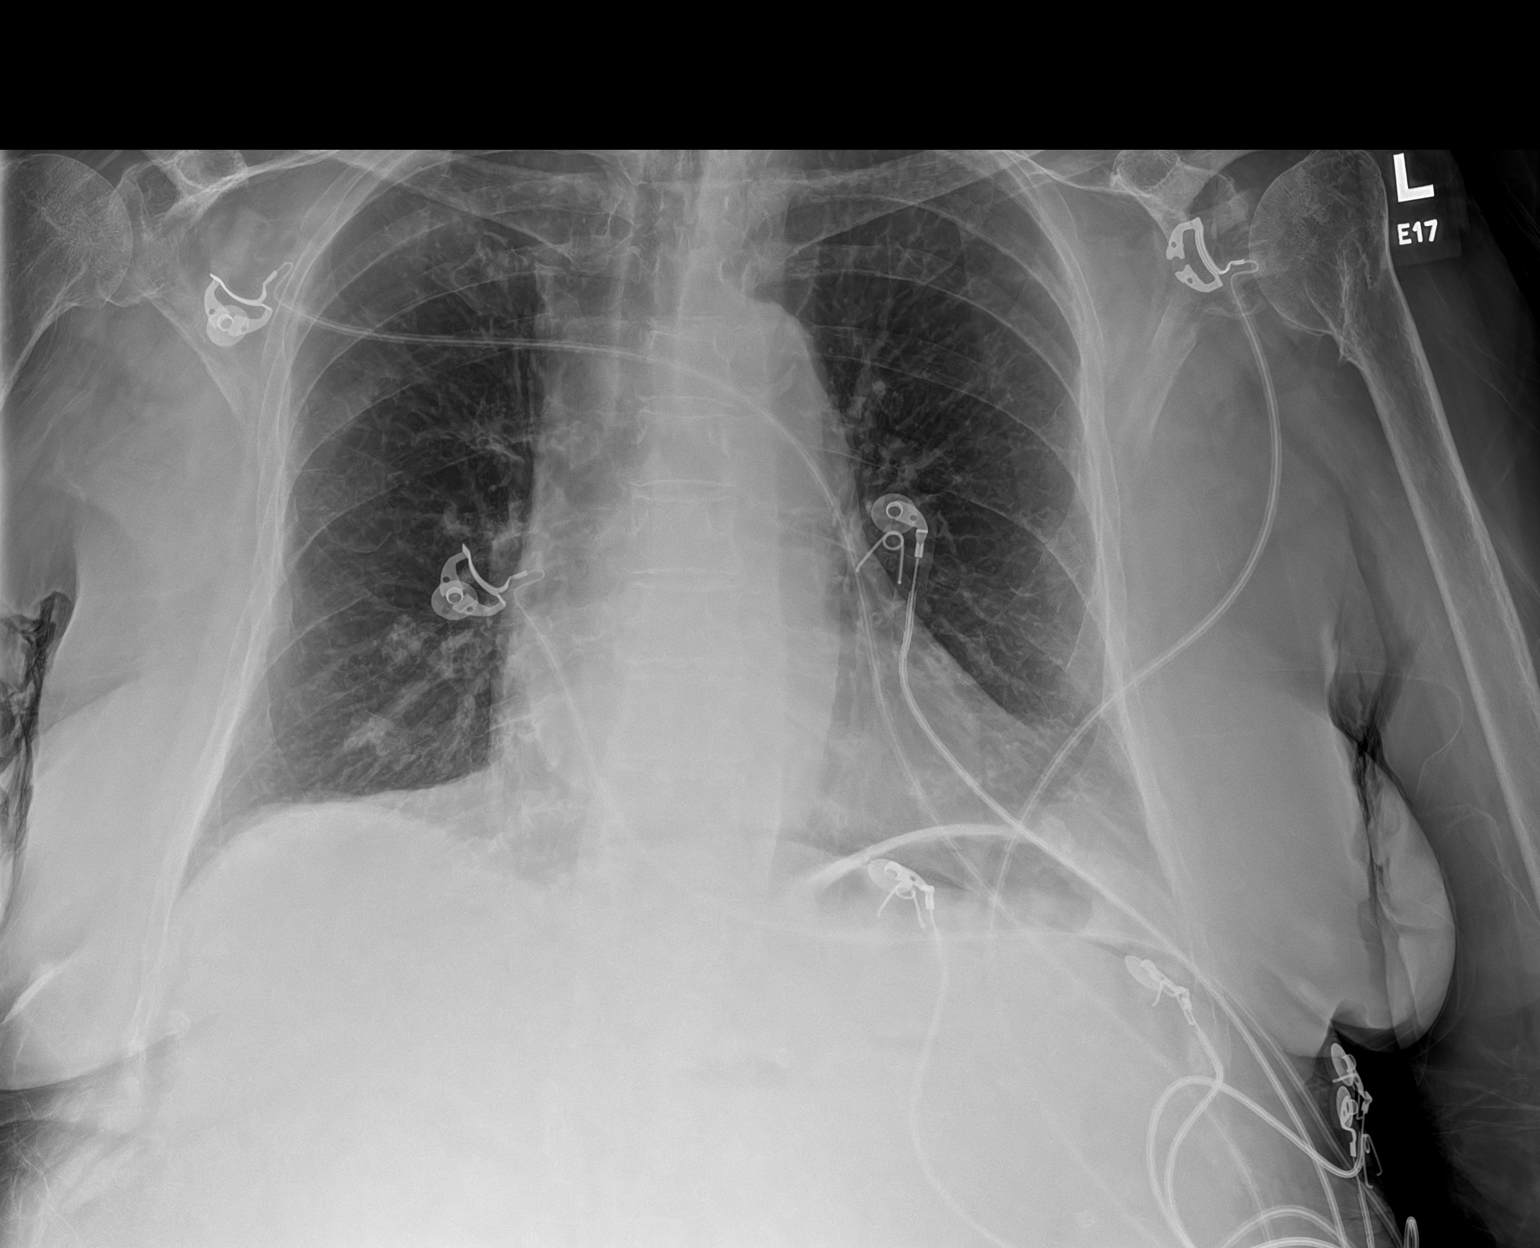

[1 of 1 positions shown; findings below may reference images not displayed]

FINDINGS: Left lung base atelectasis. Infiltrate is less likely but not
excluded. Clinical correlation is recommended. No pleural effusion
pneumothorax. Stable cardiac silhouette. Atherosclerotic
calcification of the aorta. Osteopenia with degenerative changes of
the spine and shoulders. Old healed left humeral neck fracture. No
acute osseous pathology.
IMPRESSION: Left lung base atelectasis versus infiltrate.

## 2020-08-26 MED ORDER — PANTOPRAZOLE SODIUM 40 MG PO TBEC
40.0000 mg | DELAYED_RELEASE_TABLET | Freq: Every day | ORAL | Status: DC
  Administered 2020-08-26 – 2020-08-28 (×3): 40 mg via ORAL
  Filled 2020-08-26 (×2): qty 1

## 2020-08-26 MED ORDER — SODIUM CHLORIDE 0.9 % IV SOLN
500.0000 mg | INTRAVENOUS | Status: DC
  Administered 2020-08-26 – 2020-08-27 (×2): 500 mg via INTRAVENOUS
  Filled 2020-08-26 (×2): qty 500

## 2020-08-26 MED ORDER — ACETAMINOPHEN 650 MG RE SUPP: 650.0000 mg | Freq: Four times a day (QID) | RECTAL | Status: DC | PRN

## 2020-08-26 MED ORDER — MELATONIN 3 MG PO TABS
6.0000 mg | ORAL_TABLET | Freq: Every day | ORAL | Status: DC
  Administered 2020-08-26 – 2020-08-27 (×2): 6 mg via ORAL
  Filled 2020-08-26: qty 2

## 2020-08-26 MED ORDER — LACTATED RINGERS IV SOLN: INTRAVENOUS | Status: DC

## 2020-08-26 MED ORDER — ACYCLOVIR 200 MG PO CAPS
200.0000 mg | ORAL_CAPSULE | Freq: Two times a day (BID) | ORAL | Status: DC
  Administered 2020-08-26 – 2020-08-28 (×4): 200 mg via ORAL
  Filled 2020-08-26 (×4): qty 1

## 2020-08-26 MED ORDER — GUAIFENESIN-DM 100-10 MG/5ML PO SYRP
5.0000 mL | ORAL_SOLUTION | ORAL | Status: DC | PRN
  Administered 2020-08-27: 5 mL via ORAL
  Filled 2020-08-26: qty 5

## 2020-08-26 MED ORDER — SODIUM CHLORIDE 0.9 % IV SOLN
2.0000 g | INTRAVENOUS | Status: DC
  Administered 2020-08-26 – 2020-08-27 (×2): 2 g via INTRAVENOUS
  Filled 2020-08-26 (×2): qty 20

## 2020-08-26 MED ORDER — GABAPENTIN 100 MG PO CAPS
200.0000 mg | ORAL_CAPSULE | Freq: Two times a day (BID) | ORAL | Status: DC
  Administered 2020-08-26 – 2020-08-28 (×4): 200 mg via ORAL
  Filled 2020-08-26 (×4): qty 2

## 2020-08-26 MED ORDER — SODIUM CHLORIDE 0.9 % IV BOLUS
500.0000 mL | Freq: Once | INTRAVENOUS | Status: AC
  Administered 2020-08-26: 500 mL via INTRAVENOUS

## 2020-08-26 MED ORDER — NORTRIPTYLINE HCL 25 MG PO CAPS
25.0000 mg | ORAL_CAPSULE | Freq: Every day | ORAL | Status: DC
  Administered 2020-08-26 – 2020-08-27 (×2): 25 mg via ORAL
  Filled 2020-08-26 (×2): qty 1

## 2020-08-26 MED ORDER — INSULIN ASPART 100 UNIT/ML IJ SOLN: 0.0000 [IU] | Freq: Every day | INTRAMUSCULAR | Status: DC

## 2020-08-26 MED ORDER — SODIUM CHLORIDE 0.9 % IV SOLN: INTRAVENOUS | Status: DC

## 2020-08-26 MED ORDER — FLUTICASONE PROPIONATE 50 MCG/ACT NA SUSP
1.0000 | Freq: Every day | NASAL | Status: DC
  Administered 2020-08-27 – 2020-08-28 (×2): 1 via NASAL
  Filled 2020-08-26: qty 16

## 2020-08-26 MED ORDER — LEVOTHYROXINE SODIUM 75 MCG PO TABS
75.0000 ug | ORAL_TABLET | Freq: Every day | ORAL | Status: DC
  Administered 2020-08-27 – 2020-08-28 (×2): 75 ug via ORAL
  Filled 2020-08-26 (×2): qty 1

## 2020-08-26 MED ORDER — POLYETHYLENE GLYCOL 3350 17 G PO PACK: 17.0000 g | PACK | Freq: Every day | ORAL | Status: DC | PRN

## 2020-08-26 MED ORDER — INSULIN ASPART 100 UNIT/ML IJ SOLN
0.0000 [IU] | Freq: Three times a day (TID) | INTRAMUSCULAR | Status: DC
  Administered 2020-08-27: 1 [IU] via SUBCUTANEOUS
  Administered 2020-08-27: 2 [IU] via SUBCUTANEOUS
  Administered 2020-08-28: 1 [IU] via SUBCUTANEOUS

## 2020-08-26 MED ORDER — ALBUTEROL SULFATE (2.5 MG/3ML) 0.083% IN NEBU: 2.5000 mg | INHALATION_SOLUTION | RESPIRATORY_TRACT | Status: DC | PRN

## 2020-08-26 MED ORDER — ONDANSETRON HCL 4 MG PO TABS: 4.0000 mg | ORAL_TABLET | Freq: Four times a day (QID) | ORAL | Status: DC | PRN

## 2020-08-26 MED ORDER — ACETAMINOPHEN 325 MG PO TABS: 650.0000 mg | ORAL_TABLET | Freq: Four times a day (QID) | ORAL | Status: DC | PRN

## 2020-08-26 MED ORDER — LACTATED RINGERS IV SOLN: INTRAVENOUS | Status: AC

## 2020-08-26 MED ORDER — PREDNISONE 10 MG PO TABS
5.0000 mg | ORAL_TABLET | Freq: Every day | ORAL | Status: DC
  Administered 2020-08-27 – 2020-08-28 (×2): 5 mg via ORAL
  Filled 2020-08-26 (×3): qty 1

## 2020-08-26 MED ORDER — APIXABAN 5 MG PO TABS: 5.0000 mg | ORAL_TABLET | Freq: Two times a day (BID) | ORAL | Status: DC

## 2020-08-26 MED ORDER — ONDANSETRON HCL 4 MG/2ML IJ SOLN: 4.0000 mg | Freq: Four times a day (QID) | INTRAMUSCULAR | Status: DC | PRN

## 2020-08-26 MED ORDER — APIXABAN 2.5 MG PO TABS
2.5000 mg | ORAL_TABLET | Freq: Two times a day (BID) | ORAL | Status: DC
  Administered 2020-08-26 – 2020-08-28 (×4): 2.5 mg via ORAL
  Filled 2020-08-26 (×4): qty 1

## 2020-08-26 NOTE — H&P (Signed)
History and Physical    Christina Fry JAS:[id number] DOB: 1940-09-16 DOA: 08/26/2020  PCP: Ferdie Ping, MD   Patient coming from: Home  I have personally briefly reviewed patient's old medical when she in [place]  Chief Complaint: Near passing out episodes  HPI: Christina Fry is a 80 y.o. female with medical history significant for PMR, HTN, hypothyroidism, diabetes mellitus, Graves' disease, asthma. Patient was brought to the ED from PCPs office with report of near syncopal episode.  Patient reports that this has been ongoing for about 2 months, she has had several episodes of nearly passing out, all of these alcohol when she is standing this has never happened when she is lying down.  She has never actually passed out.  She denies chest pain.  She is yet to have this evaluated by her primary care provider.  She reports chronic poor oral intake, with nausea, no vomiting, no change in bowel habits.  She has had some difficulty swallowing with feeling of food/pills getting stuck in her chest, for which she was evaluated and told she had a yeast infection twice, for which she is completing a second course of antifungal medications.  She reports feeling it is hard to catch her breath when she is having the swallowing problems, but she is unable to further clarify her symptoms of difficulty breathing.  She reports productive cough that started yesterday.  No fevers no chills.   At her PCPs office, Bactrim, started feeling lightheaded, and reported that her stomach hurt.  Blood pressure was checked-84/54 with heart rate of 75.  On EMS arrival, patient blood pressure- 80/50 with heart rate 92. 361mls given prior to arrival.  ED Course: Temperature 97.7.  Heart rate 70s to 80s.  Respiratory rate 10-18.  Blood pressure systolic 1 1 1-1 44.  Orthostatic vitals checked in the ED blood pressure systolic dropped to 41/93 on standing, patient was symptomatic with dizziness. Creatinine elevated  1.39.  WBC 15.9.  Chest x-ray shows left lower base atelectasis versus infiltrate.  EKG sinus rhythm no significant change from prior. LR at 100 cc/h started, hospitalist to admit.  Review of Systems: As per HPI all other systems reviewed and negative.  Past Medical History:  Diagnosis Date   Anxiety    Cancer ([place])    Diabetes mellitus without complication ([place])    Fibromyalgia    GERD (gastroesophageal reflux disease)    Grave's disease    Herpes    Hyperlipidemia    Mild intermittent asthma without complication 7/90/2409   Palpitation    Polymyalgia rheumatica ([place])     Past Surgical History:  Procedure Laterality Date   ABDOMINAL HYSTERECTOMY     APPENDECTOMY     CARPAL TUNNEL RELEASE     HERNIA REPAIR     INCISIONAL HERNIA REPAIR  03/20/2012   Procedure: LAPAROSCOPIC INCISIONAL HERNIA;  Surgeon: Jamesetta So, MD;  Location: [facility];  Service: General;  Laterality: N/A;   INSERTION OF MESH  03/20/2012   Procedure: INSERTION OF MESH;  Surgeon: Jamesetta So, MD;  Location: [facility];  Service: General;  Laterality: N/A;   TOTAL ABDOMINAL HYSTERECTOMY W/ BILATERAL SALPINGOOPHORECTOMY       reports that she has never smoked. She has never used smokeless tobacco. She reports that she does not drink alcohol and does not use drugs.  Allergies  Allergen Reactions   Iohexol Shortness Of Breath    Pt stopped breathing at [facility]. Pt refuses  IV dye   Codeine Itching   Morphine And Related Itching   Nitrofuran Derivatives Other (See Comments)    Unknown   Oxycodone-Acetaminophen Itching    Family History  Problem Relation Age of Onset   Allergic rhinitis Neg Hx    Asthma Neg Hx    Eczema Neg Hx    Urticaria Neg Hx    Angioedema Neg Hx    Atopy Neg Hx    Immunodeficiency Neg Hx    Prior to Admission medications   Medication Sig Start Date End Date Taking? Authorizing Provider  acetaminophen (TYLENOL) 325 MG tablet Take 650 mg by mouth in the morning and at bedtime.     Yes [provider]  acyclovir (ZOVIRAX) 200 MG capsule Take 1 capsule (200 mg total) by mouth 2 (two) times daily. 12/10/19  Yes Adhikari, Tamsen Meek, MD  albuterol (VENTOLIN HFA) 108 (90 Base) MCG/ACT inhaler Inhale 3 puffs into the lungs every 6 (six) hours as needed for wheezing or shortness of breath.   Yes [provider]  apixaban (ELIQUIS) 5 MG TABS tablet Take 5 mg by mouth 2 (two) times daily.   Yes [provider]  atorvastatin (LIPITOR) 20 MG tablet Take 20 mg by mouth at bedtime.   Yes [provider]  budesonide (RHINOCORT AQUA) 32 MCG/ACT nasal spray one spray per nostril daily (aim for the ear on each side 06/29/20  Yes Valentina Shaggy, MD  calcium-vitamin D (OSCAL WITH D) 500-200 MG-UNIT tablet Take 1 tablet by mouth daily with breakfast. 11/30/19  Yes Dahal, Marlowe Aschoff, MD  carboxymethylcellulose 1 % ophthalmic solution Apply 1-4 drops to eye every 2 (two) hours while awake. 3-4 drops in right eye, 1-2 drops in left eye every 2 hours until bedtime.   Yes [provider]  cholecalciferol (VITAMIN D3) 25 MCG (1000 UNIT) tablet Take 1,000 Units by mouth daily.   Yes [provider]  Docusate Sodium 100 MG capsule Take 100 mg by mouth 2 (two) times daily.   Yes [provider]  EPINEPHrine 0.3 mg/0.3 mL IJ SOAJ injection Inject 0.3 mg into the muscle as needed for anaphylaxis.   Yes [provider]  fluticasone (FLONASE) 50 MCG/ACT nasal spray Place 1 spray into both nostrils 2 (two) times daily. 04/05/18  Yes Valentina Shaggy, MD  gabapentin (NEURONTIN) 300 MG capsule Take 1 capsule (300 mg total) by mouth 3 (three) times daily. Patient taking differently: Take 100 mg by mouth 2 (two) times daily. 12/10/19  Yes Shelly Coss, MD  hydrOXYzine (ATARAX/VISTARIL) 10 MG tablet Take 1 tablet (10 mg total) by mouth 2 (two) times daily as needed for itching. 11/29/19  Yes Dahal, Marlowe Aschoff, MD  ipratropium (ATROVENT) 0.06 % nasal  spray Place 2 sprays into both nostrils 3 (three) times daily. 06/18/20  Yes Valentina Shaggy, MD  levothyroxine (SYNTHROID) 75 MCG tablet Take 75 mcg by mouth daily before breakfast. 30 minutes prior to meal or patient will not eat meal   Yes [provider]  loratadine (CLARITIN) 10 MG tablet Take 1 tablet (10 mg total) by mouth daily as needed for allergies. 06/18/20 08/26/20 Yes Valentina Shaggy, MD  metoprolol tartrate (LOPRESSOR) 25 MG tablet Take 1 tablet (25 mg total) by mouth 2 (two) times daily. 12/10/19  Yes Shelly Coss, MD  nortriptyline (PAMELOR) 25 MG capsule Take 25 mg by mouth at bedtime.   Yes [provider]  omeprazole (PRILOSEC) 20 MG capsule Take 20 mg by mouth  2 (two) times daily.   Yes [provider]  polyethylene glycol (MIRALAX / GLYCOLAX) 17 g packet Take 17 g by mouth daily.   Yes [provider]  traMADol (ULTRAM) 50 MG tablet Take 50 mg by mouth every 6 (six) hours as needed for moderate pain.   Yes [provider]  amLODipine (NORVASC) 5 MG tablet Take 1 tablet (5 mg total) by mouth daily. Patient not taking: Reported on 08/26/2020 12/10/19   Shelly Coss, MD  aspirin 81 MG chewable tablet Chew 1 tablet (81 mg total) by mouth daily. Patient not taking: Reported on 08/26/2020 11/30/19   Terrilee Croak, MD  feeding supplement, GLUCERNA SHAKE, (GLUCERNA SHAKE) LIQD Take 237 mLs by mouth 3 (three) times daily between meals. Patient not taking: Reported on 08/26/2020 12/04/19   Swayze, Ava, DO  fentaNYL (DURAGESIC) 12 MCG/HR Place 1 patch onto the skin every 3 (three) days. Patient not taking: Reported on 08/26/2020 12/10/19   Shelly Coss, MD  insulin aspart (NOVOLOG) 100 UNIT/ML injection Inject 0-5 Units into the skin at bedtime. 11/29/19   Dahal, Marlowe Aschoff, MD  insulin aspart (NOVOLOG) 100 UNIT/ML injection Inject 0-9 Units into the skin 3 (three) times daily with meals. 11/29/19   Terrilee Croak, MD  predniSONE (DELTASONE)  20 MG tablet Take 2 tablets (40 mg total) by mouth 2 (two) times daily with a meal. Instruction: Take 2 pills 2 times daily for 7 days then 2 pills daily for 7 days then continue talking 1 pill( 20 mg ) daily 12/10/19 01/09/20  Shelly Coss, MD    Physical Exam: Vitals:   08/26/20 1604 08/26/20 1620 08/26/20 1700 08/26/20 1720  BP: (!) 126/58 (!) 127/52 (!) 121/51 (!) 122/54  Pulse: 79 76 76 80  Resp: 14 17 10 17   Temp:      TempSrc:      SpO2: 93% 95% 93% 96%  Weight:      Height:        Constitutional: NAD, calm, comfortable Vitals:   08/26/20 1604 08/26/20 1620 08/26/20 1700 08/26/20 1720  BP: (!) 126/58 (!) 127/52 (!) 121/51 (!) 122/54  Pulse: 79 76 76 80  Resp: 14 17 10 17   Temp:      TempSrc:      SpO2: 93% 95% 93% 96%  Weight:      Height:       Eyes: PERRL, lids and conjunctivae normal ENMT: Mucous membranes are moist..  Neck: normal, supple, no masses, no thyromegaly Respiratory: clear to auscultation bilaterally, no wheezing, no crackles. Normal respiratory effort. No accessory muscle use.  Cardiovascular: Regular rate and rhythm, no murmurs / rubs / gallops. No extremity edema. 2+ pedal pulses.  Abdomen: Abdomen full without tenderness, no masses palpated. No hepatosplenomegaly. Bowel sounds positive.  Musculoskeletal: no clubbing / cyanosis. No joint deformity upper and lower extremities. Good ROM, no contractures. Normal muscle tone.  Skin: no rashes, lesions, ulcers. No induration Neurologic: No facial asymmetry, speech clear and fluent without evidence of aphasia.  5 of 5 strength in all extremities.   Psychiatric: Normal judgment and insight. Alert and oriented x 3. Normal mood.   Labs on Admission: I have personally reviewed following labs and imaging studies  CBC: Recent Labs  Lab 08/26/20 1623  WBC 15.9*  NEUTROABS 10.9*  HGB 9.6*  HCT 32.3*  MCV 100.9*  PLT 096   Basic Metabolic Panel: Recent Labs  Lab 08/26/20 1623  NA 137  K 4.2  CL  101  CO2 29  GLUCOSE 122*  BUN 24*  CREATININE 1.93*  CALCIUM 9.0   GFR: Estimated Creatinine Clearance: 20.9 mL/min (A) (by C-G formula based on SCr of 1.93 mg/dL (H)). Liver Function Tests: Recent Labs  Lab 08/26/20 1623  AST 17  ALT 12  ALKPHOS 65  BILITOT 0.3  PROT 6.2*  ALBUMIN 3.6   Recent Labs  Lab 08/26/20 1623  LIPASE 25   CBG: Recent Labs  Lab 08/26/20 1515  GLUCAP 125*   Radiological Exams on Admission: DG Chest Portable 1 View  Result Date: 08/26/2020 CLINICAL DATA:  80 year old female with near syncope. EXAM: PORTABLE CHEST 1 VIEW COMPARISON:  Chest radiograph dated 11/23/2019. FINDINGS: Left lung base atelectasis. Infiltrate is less likely but not excluded. Clinical correlation is recommended. No pleural effusion pneumothorax. Stable cardiac silhouette. Atherosclerotic calcification of the aorta. Osteopenia with degenerative changes of the spine and shoulders. Old healed left humeral neck fracture. No acute osseous pathology. IMPRESSION: Left lung base atelectasis versus infiltrate. Electronically Signed   By: Anner Crete M.D.   On: 08/26/2020 15:47    EKG: Independently reviewed.,  Rate 73, QTc 469.  No significant change from prior.  Assessment/Plan Principal Problem:   Near syncope Active Problems:   PMR (polymyalgia rheumatica) (HCC)   Essential hypertension   Hypothyroidism   Pulmonary embolism (HCC)   Near syncope- multiple episodes, x 2 months occurs while standing.  Hypotensive today- prior to arrival blood pressure in the 80s, in the ED orthostatic vitals positive with systolic dropping to 66A. Troponin 6.  EKG unremarkable. -Last Echo 11/2019, EF 60 to 65%, with grade 1 diastolic dysfunction, no significant valvular abnormality. -Trend troponin -Hydrate 500 bolus, Continue RL 100cc/hr x 15hrs - Hold anti-Hypertensive metoprolol and Norvasc ( not taking), may need to DC or adjust dose on discharge.  Possible pneumonia-chest x-ray shows  left lower base atelectasis versus infiltrate.  She reports productive cough, and questionable difficulty breathing.  She has a leukocytosis of 15.9.  Afebrile.  Rules out for sepsis. -Start empiric coverage with ceftriaxone and azithromycin -BMP, CBC in the morning  AKI-creatinine 1.9, baseline 1.1.  Likely prerenal from hypotension. -Hydrate  Hypertension-hypotensive, orthostatic hypotension -Hold antihypertensives metoprolol and Norvasc  Hypothyroidism -Resume Synthroid  Polymyalgia rheumatica-reports this diagnosis is being questioned, so she is now on a steroid taper -Resume home prednisone 5 mg daily -Doubt need for stress dose steroids at this time  Diabetes Mellitus-random glucose 122. - HgbA1c - SSI-s  Pulmonary embolism -Resume Eliquis    DVT prophylaxis: Eliquis Code Status: DNR- confirmed with patient at bedside Family Communication: [name] at bedside Disposition Plan:  ~ 1- 2 days Consults called: None Admission status: Obs tele   Bethena Roys MD [facility]  08/26/2020, 8:03 PM

## 2020-08-26 NOTE — Progress Notes (Signed)
Patient came in to get her injections however when patient went to the bathroom she started feeling lightheaded and stating that her stomach hurt. Ashleigh obtained vitals BP-84/54, HR-75, RR-18, SpO2- 99%. Patient stated that she feels like she is going to pass out. Took patient to an exam room for further evaluation and while in there patient stated she needed to use the bathroom. [name] took her to the bathroom and then came out and ask that I call 911 because patient was not doing well and seemed to be in and out of it. Dr. Ernst Bowler went in to evaluate patient and EMS showed up and took over. Called [name] at 4:50 to check on patient, [name] stated that she has not been able to go into the hospital yet so as of right now she isn't sure. Will call again tomorrow 08/27/2020 to check on patient.

## 2020-08-26 NOTE — ED Notes (Signed)
Pt felt extremely dizzy during orthostatics upon standing.

## 2020-08-26 NOTE — ED Provider Notes (Signed)
Emergency Department Provider Note   I have reviewed the triage vital signs and the nursing notes.   HISTORY  Chief Complaint Loss of Consciousness   HPI Christina Fry is a 80 y.o. female with past medical history reviewed below presents to the emergency department with intermittent near syncope episodes.  Patient states she has been dealing with this for some time but they seem more frequent over the past 30 days.  She has been in and out of rehab this past year with presumptive diagnosis of PMR.  She states that most of her care is through the [place].  She has not seen anyone other than her PCP regarding these near syncope episodes and does not follow with a cardiologist.  She states that she feels that she suddenly is completely without any energy.  She feels lightheaded like she may pass out but does not fully lose consciousness.  She denies any pain in her chest, shortness of breath, heart palpitations.  She is compliant with her home medications.  No changes to medications or doses recently.  No fevers or chills.  She had an episode today which seem more severe and so called EMS.  She is cared for by her daughter who lives across the street.  Past Medical History:  Diagnosis Date   Anxiety    Cancer ([place])    Diabetes mellitus without complication ([place])    Fibromyalgia    GERD (gastroesophageal reflux disease)    Grave's disease    Herpes    Hyperlipidemia    Mild intermittent asthma without complication 03/14/7508   Palpitation    Polymyalgia rheumatica (HCC)     Patient Active Problem List   Diagnosis Date Noted   Near syncope 08/26/2020   Weakness of both lower extremities 11/29/2019   Generalized weakness 11/23/2019   PMR (polymyalgia rheumatica) ([place]) 11/23/2019   GERD (gastroesophageal reflux disease) 11/23/2019   Fibromyalgia 11/23/2019   Essential hypertension 11/23/2019   Hypothyroidism 11/23/2019   Ambulatory dysfunction 11/23/2019   Accident due to mechanical  fall without injury 11/23/2019   TIA (transient ischemic attack) 11/23/2019   Seasonal and perennial allergic rhinitis 04/04/2018   Mild intermittent asthma without complication 25/85/2778    Past Surgical History:  Procedure Laterality Date   ABDOMINAL HYSTERECTOMY     APPENDECTOMY     CARPAL TUNNEL RELEASE     HERNIA REPAIR     INCISIONAL HERNIA REPAIR  03/20/2012   Procedure: LAPAROSCOPIC INCISIONAL HERNIA;  Surgeon: Jamesetta So, MD;  Location: [facility];  Service: General;  Laterality: N/A;   INSERTION OF MESH  03/20/2012   Procedure: INSERTION OF MESH;  Surgeon: Jamesetta So, MD;  Location: [facility];  Service: General;  Laterality: N/A;   TOTAL ABDOMINAL HYSTERECTOMY W/ BILATERAL SALPINGOOPHORECTOMY      Allergies Iohexol, Codeine, Morphine and related, Nitrofuran derivatives, and Oxycodone-acetaminophen  Family History  Problem Relation Age of Onset   Allergic rhinitis Neg Hx    Asthma Neg Hx    Eczema Neg Hx    Urticaria Neg Hx    Angioedema Neg Hx    Atopy Neg Hx    Immunodeficiency Neg Hx     Social History Social History   Tobacco Use   Smoking status: Never   Smokeless tobacco: Never  Vaping Use   Vaping Use: Never used  Substance Use Topics   Alcohol use: No   Drug use: No    Review of Systems  Constitutional: No fever/chills  Eyes: No visual changes. ENT: No sore throat. Cardiovascular: Denies chest pain. Positive near syncope episodes.  Respiratory: Denies shortness of breath. Gastrointestinal: No abdominal pain.  No nausea, no vomiting.  No diarrhea.  No constipation. Genitourinary: Negative for dysuria. Musculoskeletal: Negative for back pain. Skin: Negative for rash. Neurological: Negative for headaches, focal weakness or numbness.  10-point ROS otherwise negative.  ____________________________________________   PHYSICAL EXAM:  VITAL SIGNS: ED Triage Vitals  Enc Vitals Group     BP 08/26/20 1514 (!) 111/58     Pulse Rate 08/26/20 1514  72     Resp 08/26/20 1514 18     Temp 08/26/20 1514 97.7 F (36.5 C)     Temp Source 08/26/20 1514 Oral     SpO2 08/26/20 1514 95 %     Weight 08/26/20 1510 152 lb (68.9 kg)     Height 08/26/20 1510 5\' 3"  (1.6 m)    Constitutional: Alert and oriented. Well appearing and in no acute distress. Eyes: Conjunctivae are normal. PERRL.  Head: Atraumatic. Nose: No congestion/rhinnorhea. Mouth/Throat: Mucous membranes are moist. Neck: No stridor.   Cardiovascular: Normal rate, regular rhythm. Good peripheral circulation. Grossly normal heart sounds.   Respiratory: Normal respiratory effort.  No retractions. Lungs CTAB. Gastrointestinal: Soft and nontender. No distention.  Musculoskeletal: No lower extremity tenderness nor edema. No gross deformities of extremities. Neurologic:  Normal speech and language. No gross focal neurologic deficits are appreciated.  Skin:  Skin is warm, dry and intact. No rash noted.  ____________________________________________   LABS (all labs ordered are listed, but only abnormal results are displayed)  Labs Reviewed  COMPREHENSIVE METABOLIC PANEL - Abnormal; Notable for the following components:      Result Value   Glucose, Bld 122 (*)    BUN 24 (*)    Creatinine, Ser 1.93 (*)    Total Protein 6.2 (*)    GFR, Estimated 26 (*)    All other components within normal limits  CBC WITH DIFFERENTIAL/PLATELET - Abnormal; Notable for the following components:   WBC 15.9 (*)    RBC 3.20 (*)    Hemoglobin 9.6 (*)    HCT 32.3 (*)    MCV 100.9 (*)    MCHC 29.7 (*)    RDW 17.5 (*)    Neutro Abs 10.9 (*)    Abs Immature Granulocytes 0.19 (*)    All other components within normal limits  CBG MONITORING, ED - Abnormal; Notable for the following components:   Glucose-Capillary 125 (*)    All other components within normal limits  URINE CULTURE  RESP PANEL BY RT-PCR (FLU A&B, COVID) ARPGX2  LIPASE, BLOOD  URINALYSIS, ROUTINE W REFLEX MICROSCOPIC  TROPONIN I  (HIGH SENSITIVITY)  TROPONIN I (HIGH SENSITIVITY)   ____________________________________________  EKG   EKG Interpretation  Date/Time:  Wednesday August 26 2020 15:22:02 EDT Ventricular Rate:  73 PR Interval:  145 QRS Duration: 111 QT Interval:  425 QTC Calculation: 469 R Axis:   38 Text Interpretation: Sinus rhythm RSR' in V1 or V2, right VCD or RVH Confirmed by Nanda Quinton [phone]) on 08/26/2020 3:48:33 PM         ____________________________________________  RADIOLOGY  DG Chest Portable 1 View  Result Date: 08/26/2020 CLINICAL DATA:  80 year old female with near syncope. EXAM: PORTABLE CHEST 1 VIEW COMPARISON:  Chest radiograph dated 11/23/2019. FINDINGS: Left lung base atelectasis. Infiltrate is less likely but not excluded. Clinical correlation is recommended. No pleural effusion pneumothorax. Stable cardiac silhouette. Atherosclerotic calcification of  the aorta. Osteopenia with degenerative changes of the spine and shoulders. Old healed left humeral neck fracture. No acute osseous pathology. IMPRESSION: Left lung base atelectasis versus infiltrate. Electronically Signed   By: Anner Crete M.D.   On: 08/26/2020 15:47    ____________________________________________   PROCEDURES  Procedure(s) performed:   Procedures  None  ____________________________________________   INITIAL IMPRESSION / ASSESSMENT AND PLAN / ED COURSE  Pertinent labs & imaging results that were available during my care of the patient were reviewed by me and considered in my medical decision making (see chart for details).   Patient presents to the emergency department with intermittent near syncope episodes over the past month with a more severe episode today.  No full syncope.  No chest pain or heart palpitations.  EKG appears similar to prior tracings.  Past medical work-up is somewhat limited due to the patient being primarily followed at the [facility].  Her last echo was from 4503  showing diastolic CHF.  No medication changes.  She is not febrile to suspect infectious etiology.  Normal vital signs on arrival.  05:38 PM  Patient is very orthostatic with her vitals with blood pressure dropping to the 70s with standing.  She is symptomatic during this episode.  Chest x-ray questions atelectasis versus infiltrate.  Patient is not having significant pneumonia symptoms at this time.  We will hold on antibiotics and follow clinical symptoms.  She is having her most recent medication list faxed over from the [place]. unclear if this is dehydration versus medication issue versus cardiogenic etiology.  Plan for admit, IV fluids, and consideration of ECHO per [facility] team.   Updated med list obtained from [place] and placed at the bedside for hospitalist/pharmacy review.   Discussed patient's case with [facility], Dr. Denton Brick to request admission. Patient and family (if present) updated with plan. Care transferred to [facility] service.  I reviewed all nursing notes, vitals, pertinent old records, EKGs, labs, imaging (as available).  ____________________________________________  FINAL CLINICAL IMPRESSION(S) / ED DIAGNOSES  Final diagnoses:  Near syncope  Orthostatic hypotension    MEDICATIONS GIVEN DURING THIS VISIT:  Medications  lactated ringers infusion ( Intravenous New Bag/Given 08/26/20 1745)    Note:  This document was prepared using Dragon voice recognition software and may include unintentional dictation errors.  Nanda Quinton, MD, [facility]    Kaiyon Hynes, Wonda [facility], MD 08/26/20 1757

## 2020-08-26 NOTE — ED Triage Notes (Signed)
Pt brought to ED via [facility] for unresponsive from PCP office. EMS called for unresponsive episode. BP 08/67 the 92 systolic. Pt states she feels nauseated and dizzy. Pt responsive in triage. Pt given 300 ml NS.

## 2020-08-27 ENCOUNTER — Observation Stay (HOSPITAL_COMMUNITY): Payer: No Typology Code available for payment source

## 2020-08-27 ENCOUNTER — Telehealth: Payer: Self-pay

## 2020-08-27 ENCOUNTER — Encounter (HOSPITAL_COMMUNITY): Payer: Self-pay | Admitting: Internal Medicine

## 2020-08-27 DIAGNOSIS — M353 Polymyalgia rheumatica: Secondary | ICD-10-CM | POA: Diagnosis present

## 2020-08-27 DIAGNOSIS — Z8673 Personal history of transient ischemic attack (TIA), and cerebral infarction without residual deficits: Secondary | ICD-10-CM | POA: Diagnosis not present

## 2020-08-27 DIAGNOSIS — R1310 Dysphagia, unspecified: Secondary | ICD-10-CM | POA: Diagnosis present

## 2020-08-27 DIAGNOSIS — Z9049 Acquired absence of other specified parts of digestive tract: Secondary | ICD-10-CM | POA: Diagnosis not present

## 2020-08-27 DIAGNOSIS — Z66 Do not resuscitate: Secondary | ICD-10-CM | POA: Diagnosis present

## 2020-08-27 DIAGNOSIS — D509 Iron deficiency anemia, unspecified: Secondary | ICD-10-CM | POA: Diagnosis present

## 2020-08-27 DIAGNOSIS — J4520 Mild intermittent asthma, uncomplicated: Secondary | ICD-10-CM | POA: Diagnosis present

## 2020-08-27 DIAGNOSIS — Z90710 Acquired absence of both cervix and uterus: Secondary | ICD-10-CM | POA: Diagnosis not present

## 2020-08-27 DIAGNOSIS — Z885 Allergy status to narcotic agent status: Secondary | ICD-10-CM | POA: Diagnosis not present

## 2020-08-27 DIAGNOSIS — I10 Essential (primary) hypertension: Secondary | ICD-10-CM | POA: Diagnosis present

## 2020-08-27 DIAGNOSIS — E039 Hypothyroidism, unspecified: Secondary | ICD-10-CM | POA: Diagnosis present

## 2020-08-27 DIAGNOSIS — J189 Pneumonia, unspecified organism: Secondary | ICD-10-CM | POA: Diagnosis present

## 2020-08-27 DIAGNOSIS — F419 Anxiety disorder, unspecified: Secondary | ICD-10-CM | POA: Diagnosis present

## 2020-08-27 DIAGNOSIS — E119 Type 2 diabetes mellitus without complications: Secondary | ICD-10-CM | POA: Diagnosis present

## 2020-08-27 DIAGNOSIS — B379 Candidiasis, unspecified: Secondary | ICD-10-CM | POA: Diagnosis present

## 2020-08-27 DIAGNOSIS — E0500 Thyrotoxicosis with diffuse goiter without thyrotoxic crisis or storm: Secondary | ICD-10-CM | POA: Diagnosis present

## 2020-08-27 DIAGNOSIS — Z20822 Contact with and (suspected) exposure to covid-19: Secondary | ICD-10-CM | POA: Diagnosis present

## 2020-08-27 DIAGNOSIS — I2782 Chronic pulmonary embolism: Secondary | ICD-10-CM | POA: Diagnosis present

## 2020-08-27 DIAGNOSIS — M797 Fibromyalgia: Secondary | ICD-10-CM | POA: Diagnosis present

## 2020-08-27 DIAGNOSIS — R55 Syncope and collapse: Secondary | ICD-10-CM | POA: Diagnosis not present

## 2020-08-27 DIAGNOSIS — E785 Hyperlipidemia, unspecified: Secondary | ICD-10-CM | POA: Diagnosis present

## 2020-08-27 DIAGNOSIS — N179 Acute kidney failure, unspecified: Secondary | ICD-10-CM | POA: Diagnosis present

## 2020-08-27 DIAGNOSIS — I951 Orthostatic hypotension: Secondary | ICD-10-CM | POA: Diagnosis present

## 2020-08-27 DIAGNOSIS — K219 Gastro-esophageal reflux disease without esophagitis: Secondary | ICD-10-CM | POA: Diagnosis present

## 2020-08-27 DIAGNOSIS — Z888 Allergy status to other drugs, medicaments and biological substances status: Secondary | ICD-10-CM | POA: Diagnosis not present

## 2020-08-27 LAB — BASIC METABOLIC PANEL
Anion gap: 5 (ref 5–15)
BUN: 22 mg/dL (ref 8–23)
CO2: 27 mmol/L (ref 22–32)
Calcium: 8 mg/dL — ABNORMAL LOW (ref 8.9–10.3)
Chloride: 102 mmol/L (ref 98–111)
Creatinine, Ser: 1.64 mg/dL — ABNORMAL HIGH (ref 0.44–1.00)
GFR, Estimated: 31 mL/min — ABNORMAL LOW (ref >60)
Glucose, Bld: 193 mg/dL — ABNORMAL HIGH (ref 70–99)
Potassium: 4.3 mmol/L (ref 3.5–5.1)
Sodium: 134 mmol/L — ABNORMAL LOW (ref 135–145)

## 2020-08-27 LAB — RETICULOCYTES
Immature Retic Fract: 24 % — ABNORMAL HIGH (ref 2.3–15.9)
RBC.: 2.92 MIL/uL — ABNORMAL LOW (ref 3.87–5.11)
Retic Count, Absolute: 43.5 10*3/uL (ref 19.0–186.0)
Retic Ct Pct: 1.5 % (ref 0.4–3.1)

## 2020-08-27 LAB — GLUCOSE, CAPILLARY
Glucose-Capillary: 79 mg/dL (ref 70–99)
Glucose-Capillary: 166 mg/dL — ABNORMAL HIGH (ref 70–99)
Glucose-Capillary: 76 mg/dL (ref 70–99)

## 2020-08-27 LAB — CBC
HCT: 30.2 % — ABNORMAL LOW (ref 36.0–46.0)
Hemoglobin: 8.9 g/dL — ABNORMAL LOW (ref 12.0–15.0)
MCH: 30.3 pg (ref 26.0–34.0)
MCHC: 29.5 g/dL — ABNORMAL LOW (ref 30.0–36.0)
MCV: 102.7 fL — ABNORMAL HIGH (ref 80.0–100.0)
Platelets: 309 10*3/uL (ref 150–400)
RBC: 2.94 MIL/uL — ABNORMAL LOW (ref 3.87–5.11)
RDW: 17.9 % — ABNORMAL HIGH (ref 11.5–15.5)
WBC: 22.5 10*3/uL — ABNORMAL HIGH (ref 4.0–10.5)
nRBC: 0 % (ref 0.0–0.2)

## 2020-08-27 LAB — IRON AND TIBC
Iron: 25 ug/dL — ABNORMAL LOW (ref 28–170)
Saturation Ratios: 7 % — ABNORMAL LOW (ref 10.4–31.8)
TIBC: 364 ug/dL (ref 250–450)
UIBC: 339 ug/dL

## 2020-08-27 LAB — HEMOGLOBIN AND HEMATOCRIT, BLOOD
HCT: 32 % — ABNORMAL LOW (ref 36.0–46.0)
Hemoglobin: 9.4 g/dL — ABNORMAL LOW (ref 12.0–15.0)

## 2020-08-27 LAB — FERRITIN: Ferritin: 9 ng/mL — ABNORMAL LOW (ref 11–307)

## 2020-08-27 LAB — HEMOGLOBIN A1C
Hgb A1c MFr Bld: 6.3 % — ABNORMAL HIGH (ref 4.8–5.6)
Mean Plasma Glucose: 134 mg/dL

## 2020-08-27 LAB — VITAMIN B12: Vitamin B-12: 168 pg/mL — ABNORMAL LOW (ref 180–914)

## 2020-08-27 LAB — FOLATE: Folate: 8.8 ng/mL (ref >5.9)

## 2020-08-27 LAB — PROCALCITONIN: Procalcitonin: 0.45 ng/mL

## 2020-08-27 IMAGING — US US CAROTID DUPLEX BILAT
1 series · 13 of 24 positions shown · non-contrast
Comparison: [DATE]

CLINICAL DATA: 80-year-old with near syncope.

EXAM:
BILATERAL CAROTID DUPLEX ULTRASOUND
TECHNIQUE: Gray scale imaging, color Doppler and duplex ultrasound were
performed of bilateral carotid and vertebral arteries in the neck.

[Series 1: us carotid duplex bilat · 0.06mm/px · 13 of 68 slices shown]
[im 1/68]
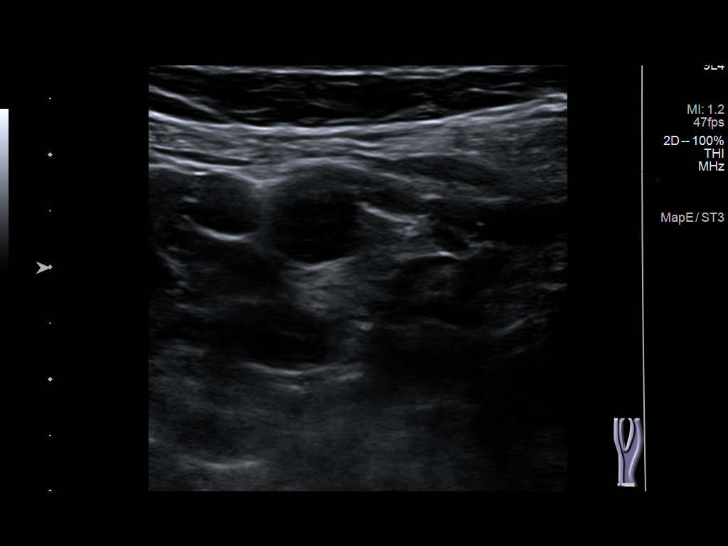
[im 6/68]
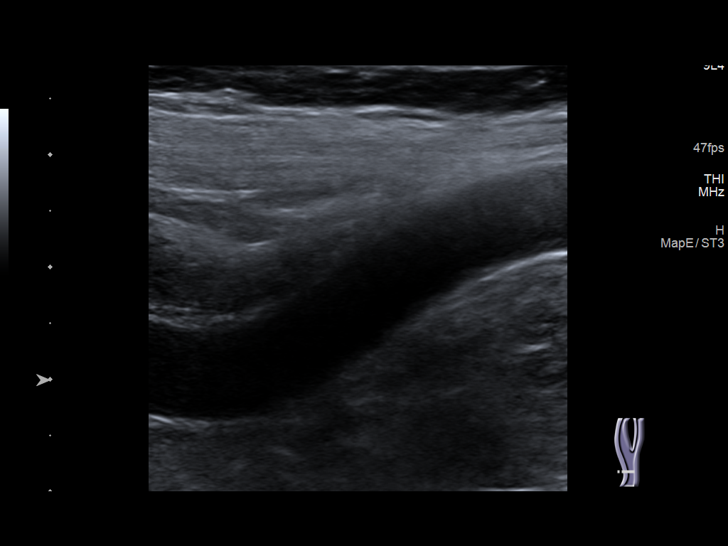
[im 12/68]
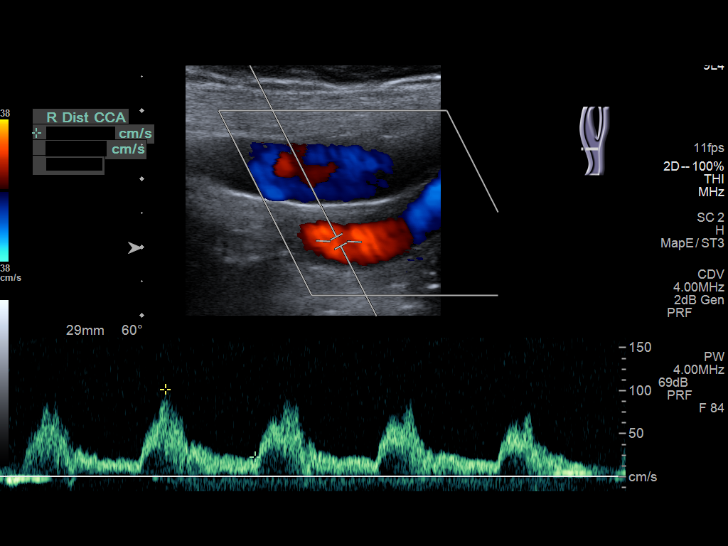
[im 18/68]
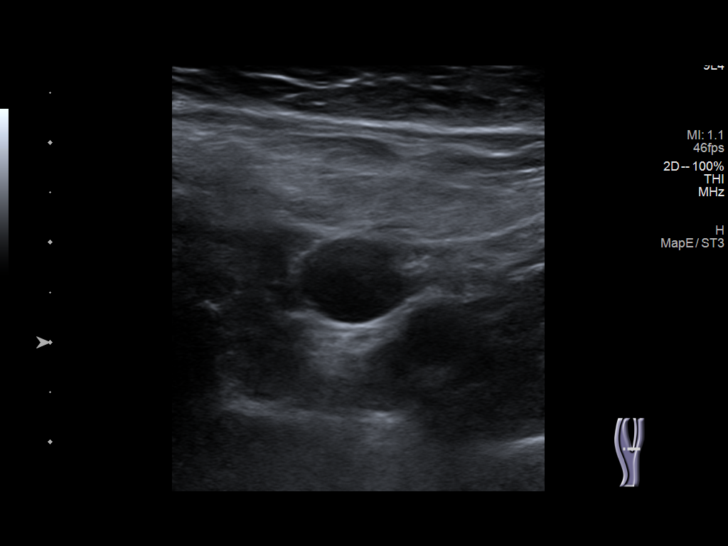
[im 24/68]
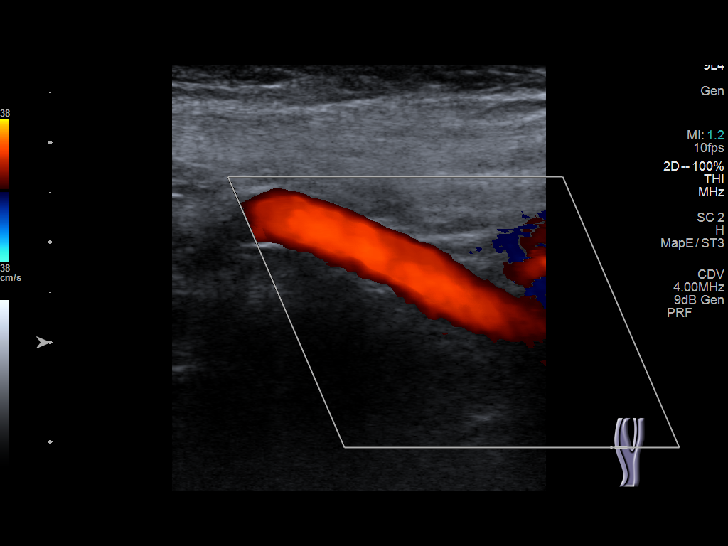
[im 30/68]
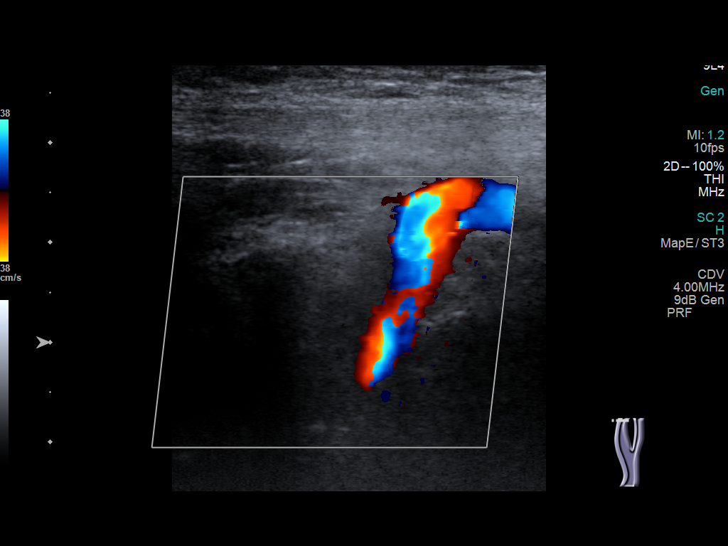
[im 35/68]
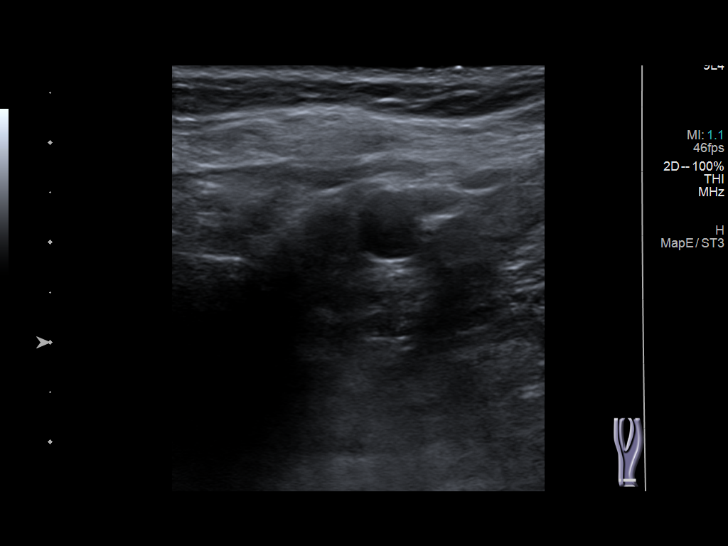
[im 38/68]
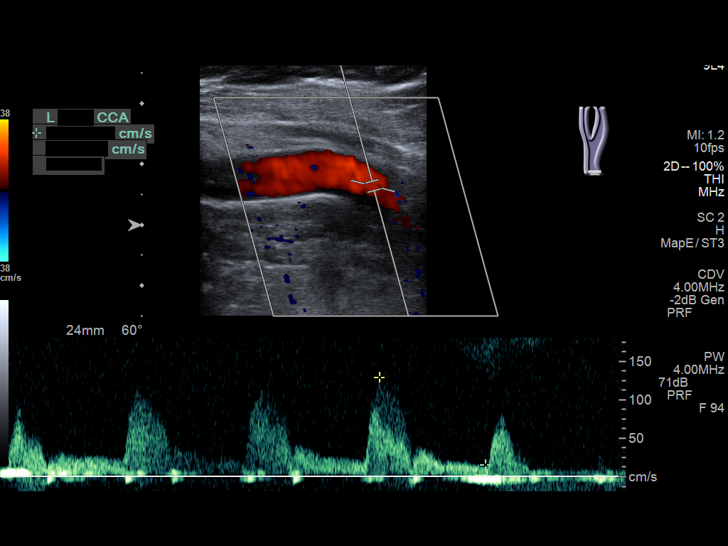
[im 44/68]
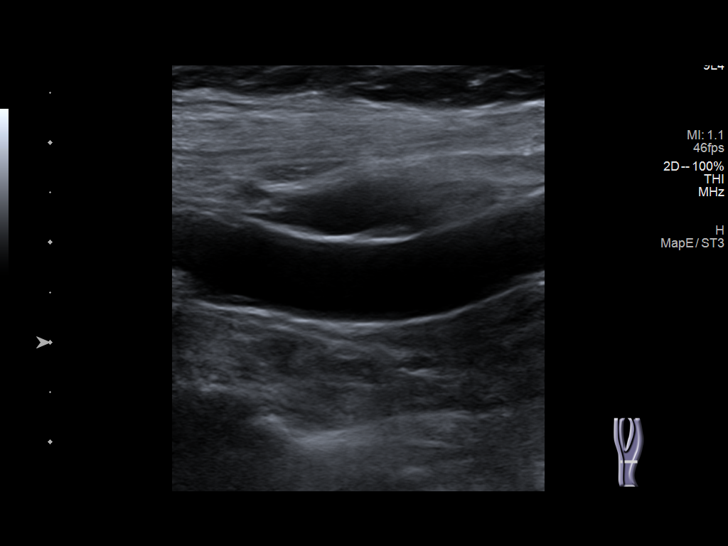
[im 50/68]
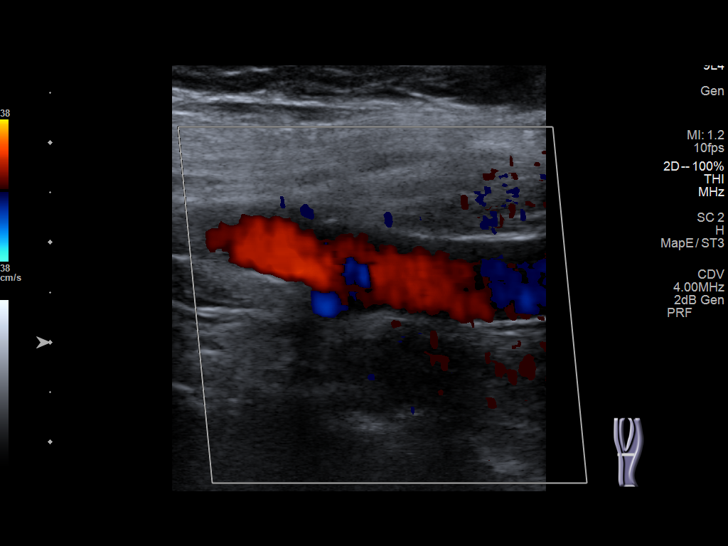
[im 56/68]
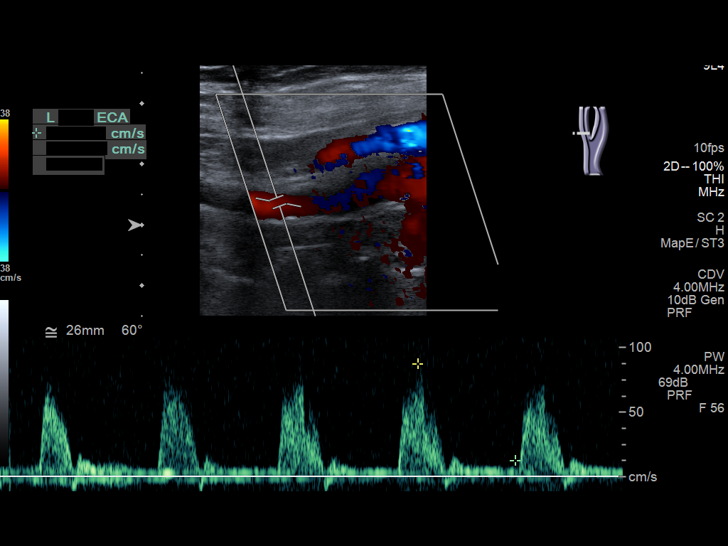
[im 62/68]
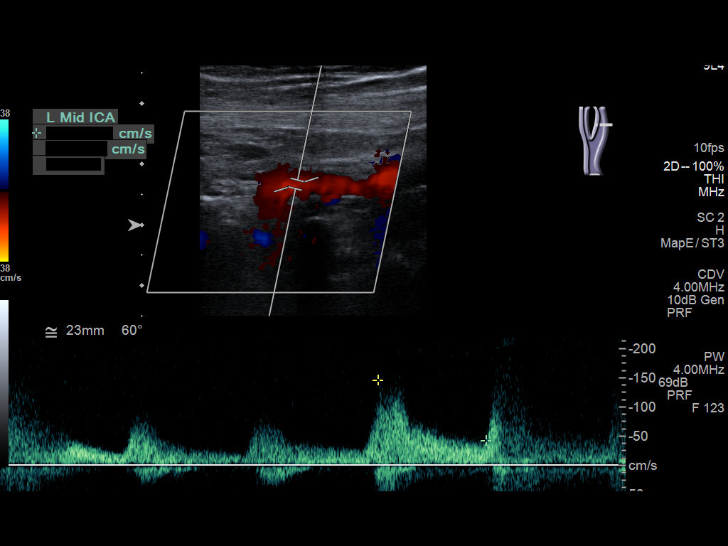
[im 68/68]
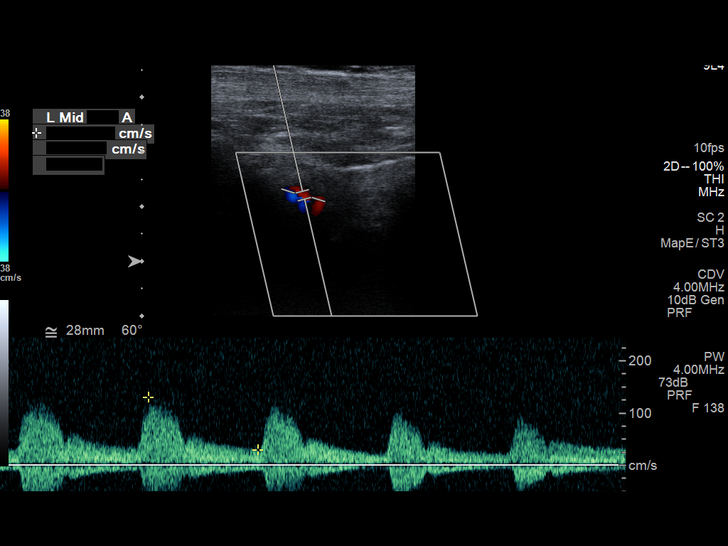

[13 of 24 positions shown; findings below may reference images not displayed]

FINDINGS: Criteria: Quantification of carotid stenosis is based on velocity
parameters that correlate the residual internal carotid diameter
with NASCET-based stenosis levels, using the diameter of the distal
internal carotid lumen as the denominator for stenosis measurement.

The following velocity measurements were obtained:

RIGHT

ICA: 165/44 cm/sec

CCA: 103/21 cm/sec

SYSTOLIC ICA/CCA RATIO:

ECA: 106 cm/sec

LEFT

ICA: 191/60 cm/sec

CCA: 123/19 cm/sec

SYSTOLIC ICA/CCA RATIO:

ECA: 88 cm/sec

RIGHT CAROTID ARTERY: Small amount of plaque at the right carotid
bulb is similar to the previous examination. External carotid artery
is patent with normal waveform. Peak systolic velocity in the
proximal internal carotid artery is elevated measuring 165 cm/sec.
No significant stenosis in the proximal internal carotid artery
based on the grayscale imaging. Mid and distal internal carotid
artery are patent but tortuous.

RIGHT VERTEBRAL ARTERY: Antegrade flow and normal waveform in the
right vertebral artery.

LEFT CAROTID ARTERY: Small amount of plaque at the left carotid
bulb. External carotid artery is patent with normal waveform. Peak
systolic velocity in the distal left internal carotid artery is
elevated measuring 191 cm/sec but this could be associated with
tortuosity rather than true stenosis.

LEFT VERTEBRAL ARTERY: Antegrade flow and normal waveform in the
left vertebral artery.
IMPRESSION: 1. Mild atherosclerotic disease involving bilateral carotid
arteries. Peak systolic velocities are elevated in both internal
carotid arteries but the carotid artery ratios are less than 2
bilaterally. Difficult to accurately measure the degree of stenosis.
Suspect the degree of stenosis is less than 50% based on the carotid
artery ratios and grayscale imaging but cannot exclude greater than
50% stenosis based on the velocities. Recommend follow-up
surveillance.
2. Patent vertebral arteries with antegrade flow.

## 2020-08-27 MED ORDER — LORATADINE 10 MG PO TABS: 10.0000 mg | ORAL_TABLET | Freq: Every day | ORAL | Status: DC | PRN

## 2020-08-27 MED ORDER — POLYVINYL ALCOHOL 1.4 % OP SOLN
1.0000 [drp] | OPHTHALMIC | Status: DC | PRN
Start: 2020-08-27 — End: 2020-08-28

## 2020-08-27 MED ORDER — LABETALOL HCL 5 MG/ML IV SOLN: 10.0000 mg | INTRAVENOUS | Status: DC | PRN

## 2020-08-27 MED ORDER — HYDROXYZINE HCL 10 MG PO TABS
10.0000 mg | ORAL_TABLET | Freq: Two times a day (BID) | ORAL | Status: DC | PRN
  Administered 2020-08-27: 10 mg via ORAL
  Filled 2020-08-27: qty 1

## 2020-08-27 NOTE — Telephone Encounter (Signed)
Called patient's [name] to see how patient was doing today since she had to be taken by EMS from the office yesterday. Left a message for her to call me back in the [place] office today.

## 2020-08-27 NOTE — TOC Progression Note (Signed)
Transition of Care [facility]) - Progression Note    Patient Details  Name: Christina Fry MRN: [id number] Date of Birth: 1941/03/12  Transition of Care [facility]) CM/SW Contact  Boneta Lucks, RN Phone Number: 08/27/2020, 11:01 AM  Clinical Narrative:   Patient admitted in OBS for near syncope, PT eval pending, DC plan 2 days.  [place] notification completed, [phone]

## 2020-08-27 NOTE — Progress Notes (Signed)
PROGRESS NOTE    Christina Fry  BHA:[id number] DOB: [birth year] DOA: 08/26/2020 PCP: Ferdie Ping, MD   Brief Narrative:   Christina Fry is a 80 y.o. female with medical history significant for PMR, HTN, hypothyroidism, diabetes mellitus, Graves' disease, asthma. Patient was brought to the ED from PCPs office with report of near syncopal episode.  Patient was admitted with symptoms of near syncope for the last 2 months as well as some possible pneumonia as she was having coughing at home.  Assessment & Plan:   Principal Problem:   Near syncope Active Problems:   PMR (polymyalgia rheumatica) (HCC)   Essential hypertension   Hypothyroidism   Pulmonary embolism (HCC)   Recurrent near syncope-multifactorial -Related to poor oral intake with some dehydration as well as worsening anemia -Recheck orthostatics in a.m. and obtain PT evaluation -Holding home amlodipine and metoprolol -Carotid ultrasound and 2D echocardiogram  Worsening iron deficiency anemia -Patient has known this previously, but does not appear to be on supplementation -Avoid dosing of Feraheme given pneumonia -Plan to start supplementation at home -Trend H/H and transfuse for hemoglobin less than 7 or less than 8 with persistent symptoms  Left lower lobe community-acquired pneumonia -Continue Rocephin and azithromycin  AKI -Baseline creatinine 1.1 -Appears to be improving with IV fluid hydration which will be continued -Monitor strict I's and O's  Hypertension -Hold metoprolol and Norvasc -Labetalol as needed for significant elevations  Hypothyroidism -Synthroid  Polymyalgia rheumatica -Continue steroid with prednisone 5 mg daily  Type 2 diabetes -SSI and hemoglobin A1c pending  Pulmonary embolism -Eliquis   DVT prophylaxis: Eliquis Code Status: DNR Family Communication: None at bedside, patient will call Disposition Plan:  Status is: Observation  The patient will require care spanning > 2  midnights and should be moved to inpatient because: Hemodynamically unstable  Dispo: The patient is from: Home              Anticipated d/c is to: Home              Patient currently is not medically stable to d/c.   Difficult to place patient No    Consultants:  None  Procedures:  See below  Antimicrobials:  Anti-infectives (From admission, onward)    Start     Dose/Rate Route Frequency Ordered Stop   08/26/20 2200  acyclovir (ZOVIRAX) 200 MG capsule 200 mg        200 mg Oral 2 times daily 08/26/20 2017     08/26/20 2115  cefTRIAXone (ROCEPHIN) 2 g in sodium chloride 0.9 % 100 mL IVPB        2 g 200 mL/hr over 30 Minutes Intravenous Every 24 hours 08/26/20 2017 08/31/20 2114   08/26/20 2115  azithromycin (ZITHROMAX) 500 mg in sodium chloride 0.9 % 250 mL IVPB        500 mg 250 mL/hr over 60 Minutes Intravenous Every 24 hours 08/26/20 2017 08/31/20 2114       Subjective: Patient seen and evaluated today with no new acute complaints or concerns. No acute concerns or events noted overnight.  Objective: Vitals:   08/26/20 1800 08/26/20 1900 08/26/20 2012 08/27/20 0435  BP: (!) 144/61 (!) 141/65 (!) 157/69 106/60  Pulse: 78 78 81 78  Resp: 14 18 19 19   Temp:   98.1 F (36.7 C) 97.6 F (36.4 C)  TempSrc:      SpO2: 98% 97% 94% 94%  Weight:      Height:  Intake/Output Summary (Last 24 hours) at 08/27/2020 1122 Last data filed at 08/27/2020 0900 Gross per 24 hour  Intake 480 ml  Output --  Net 480 ml   Filed Weights   08/26/20 1510  Weight: 68.9 kg    Examination:  General exam: Appears calm and comfortable  Respiratory system: Clear to auscultation. Respiratory effort normal. Cardiovascular system: S1 & S2 heard, RRR.  Gastrointestinal system: Abdomen is soft Central nervous system: Alert and awake Extremities: No edema Skin: No significant lesions noted Psychiatry: Flat affect.    Data Reviewed: I have personally reviewed following labs and  imaging studies  CBC: Recent Labs  Lab 08/26/20 1623 08/27/20 0638  WBC 15.9* 22.5*  NEUTROABS 10.9*  --   HGB 9.6* 8.9*  HCT 32.3* 30.2*  MCV 100.9* 102.7*  PLT 346 381   Basic Metabolic Panel: Recent Labs  Lab 08/26/20 1623 08/27/20 0638  NA 137 134*  K 4.2 4.3  CL 101 102  CO2 29 27  GLUCOSE 122* 193*  BUN 24* 22  CREATININE 1.93* 1.64*  CALCIUM 9.0 8.0*   GFR: Estimated Creatinine Clearance: 24.6 mL/min (A) (by C-G formula based on SCr of 1.64 mg/dL (H)). Liver Function Tests: Recent Labs  Lab 08/26/20 1623  AST 17  ALT 12  ALKPHOS 65  BILITOT 0.3  PROT 6.2*  ALBUMIN 3.6   Recent Labs  Lab 08/26/20 1623  LIPASE 25   No results for input(s): AMMONIA in the last 168 hours. Coagulation Profile: No results for input(s): INR, PROTIME in the last 168 hours. Cardiac Enzymes: No results for input(s): CKTOTAL, CKMB, CKMBINDEX, TROPONINI in the last 168 hours. BNP (last 3 results) No results for input(s): PROBNP in the last 8760 hours. HbA1C: No results for input(s): HGBA1C in the last 72 hours. CBG: Recent Labs  Lab 08/26/20 1515 08/27/20 1104  GLUCAP 125* 79   Lipid Profile: No results for input(s): CHOL, HDL, LDLCALC, TRIG, CHOLHDL, LDLDIRECT in the last 72 hours. Thyroid Function Tests: No results for input(s): TSH, T4TOTAL, FREET4, T3FREE, THYROIDAB in the last 72 hours. Anemia Panel: Recent Labs    08/26/20 1624 08/27/20 0638  VITAMINB12 168*  --   FOLATE 8.8  --   FERRITIN 9*  --   TIBC 364  --   IRON 25*  --   RETICCTPCT  --  1.5   Sepsis Labs: Recent Labs  Lab 08/27/20 0175  PROCALCITON 0.45    Recent Results (from the past 240 hour(s))  Resp Panel by RT-PCR (Flu A&B, Covid) Nasopharyngeal Swab     Status: None   Collection Time: 08/26/20  3:30 PM   Specimen: Nasopharyngeal Swab; Nasopharyngeal(NP) swabs in vial transport medium  Result Value Ref Range Status   SARS Coronavirus 2 by RT PCR NEGATIVE NEGATIVE Final     Comment: (NOTE) SARS-CoV-2 target nucleic acids are NOT DETECTED.  The SARS-CoV-2 RNA is generally detectable in upper respiratory specimens during the acute phase of infection. The lowest concentration of SARS-CoV-2 viral copies this assay can detect is 138 copies/mL. A negative result does not preclude SARS-Cov-2 infection and should not be used as the sole basis for treatment or other patient management decisions. A negative result may occur with  improper specimen collection/handling, submission of specimen other than nasopharyngeal swab, presence of viral mutation(s) within the areas targeted by this assay, and inadequate number of viral copies(<138 copies/mL). A negative result must be combined with clinical observations, patient history, and epidemiological information. The expected result  is Negative.  Fact Sheet for Patients:  EntrepreneurPulse.com.au  Fact Sheet for Healthcare Providers:  IncredibleEmployment.be  This test is no t yet approved or cleared by the [place] FDA and  has been authorized for detection and/or diagnosis of SARS-CoV-2 by FDA under an Emergency Use Authorization (EUA). This EUA will remain  in effect (meaning this test can be used) for the duration of the COVID-19 declaration under Section 564(b)(1) of the Act, 21 U.S.C.section 360bbb-3(b)(1), unless the authorization is terminated  or revoked sooner.       Influenza A by PCR NEGATIVE NEGATIVE Final   Influenza B by PCR NEGATIVE NEGATIVE Final    Comment: (NOTE) The Xpert Xpress SARS-CoV-2/FLU/RSV plus assay is intended as an aid in the diagnosis of influenza from Nasopharyngeal swab specimens and should not be used as a sole basis for treatment. Nasal washings and aspirates are unacceptable for Xpert Xpress SARS-CoV-2/FLU/RSV testing.  Fact Sheet for Patients: EntrepreneurPulse.com.au  Fact Sheet for Healthcare  Providers: IncredibleEmployment.be  This test is not yet approved or cleared by the [place] FDA and has been authorized for detection and/or diagnosis of SARS-CoV-2 by FDA under an Emergency Use Authorization (EUA). This EUA will remain in effect (meaning this test can be used) for the duration of the COVID-19 declaration under Section 564(b)(1) of the Act, 21 U.S.C. section 360bbb-3(b)(1), unless the authorization is terminated or revoked.  Performed at [facility], [address], [place], [place] [address]          Radiology Studies: DG Chest Portable 1 View  Result Date: 08/26/2020 CLINICAL DATA:  80 year old female with near syncope. EXAM: PORTABLE CHEST 1 VIEW COMPARISON:  Chest radiograph dated 11/23/2019. FINDINGS: Left lung base atelectasis. Infiltrate is less likely but not excluded. Clinical correlation is recommended. No pleural effusion pneumothorax. Stable cardiac silhouette. Atherosclerotic calcification of the aorta. Osteopenia with degenerative changes of the spine and shoulders. Old healed left humeral neck fracture. No acute osseous pathology. IMPRESSION: Left lung base atelectasis versus infiltrate. Electronically Signed   By: Anner Crete M.D.   On: 08/26/2020 15:47        Scheduled Meds:  acyclovir  200 mg Oral BID   apixaban  2.5 mg Oral BID   fluticasone  1 spray Each Nare Daily   gabapentin  200 mg Oral BID   insulin aspart  0-5 Units Subcutaneous QHS   insulin aspart  0-9 Units Subcutaneous TID WC   levothyroxine  75 mcg Oral QAC breakfast   melatonin  6 mg Oral QHS   nortriptyline  25 mg Oral QHS   pantoprazole  40 mg Oral Daily   predniSONE  5 mg Oral Q breakfast   Continuous Infusions:  azithromycin 500 mg (08/26/20 2157)   cefTRIAXone (ROCEPHIN)  IV 2 g (08/26/20 2317)   lactated ringers Stopped (08/27/20 1103)     LOS: 0 days    Time spent: 35 minutes    Virgie Kunda Darleen Crocker, DO [facility]  If  7PM-7AM, please contact night-coverage [email or web address] 08/27/2020, 11:22 AM

## 2020-08-28 ENCOUNTER — Other Ambulatory Visit (HOSPITAL_COMMUNITY): Payer: Self-pay | Admitting: *Deleted

## 2020-08-28 ENCOUNTER — Inpatient Hospital Stay (HOSPITAL_COMMUNITY): Payer: No Typology Code available for payment source

## 2020-08-28 DIAGNOSIS — R55 Syncope and collapse: Secondary | ICD-10-CM

## 2020-08-28 LAB — HEMOGLOBIN A1C
Hgb A1c MFr Bld: 6.2 % — ABNORMAL HIGH (ref 4.8–5.6)
Mean Plasma Glucose: 131 mg/dL

## 2020-08-28 LAB — BASIC METABOLIC PANEL
Anion gap: 6 (ref 5–15)
BUN: 17 mg/dL (ref 8–23)
CO2: 29 mmol/L (ref 22–32)
Calcium: 8.6 mg/dL — ABNORMAL LOW (ref 8.9–10.3)
Chloride: 107 mmol/L (ref 98–111)
Creatinine, Ser: 1.28 mg/dL — ABNORMAL HIGH (ref 0.44–1.00)
GFR, Estimated: 42 mL/min — ABNORMAL LOW (ref >60)
Glucose, Bld: 91 mg/dL (ref 70–99)
Potassium: 4.3 mmol/L (ref 3.5–5.1)
Sodium: 142 mmol/L (ref 135–145)

## 2020-08-28 LAB — CBC
HCT: 29 % — ABNORMAL LOW (ref 36.0–46.0)
Hemoglobin: 8.6 g/dL — ABNORMAL LOW (ref 12.0–15.0)
MCH: 30 pg (ref 26.0–34.0)
MCHC: 29.7 g/dL — ABNORMAL LOW (ref 30.0–36.0)
MCV: 101 fL — ABNORMAL HIGH (ref 80.0–100.0)
Platelets: 294 10*3/uL (ref 150–400)
RBC: 2.87 MIL/uL — ABNORMAL LOW (ref 3.87–5.11)
RDW: 17.6 % — ABNORMAL HIGH (ref 11.5–15.5)
WBC: 10.5 10*3/uL (ref 4.0–10.5)
nRBC: 0 % (ref 0.0–0.2)

## 2020-08-28 LAB — GLUCOSE, CAPILLARY
Glucose-Capillary: 146 mg/dL — ABNORMAL HIGH (ref 70–99)
Glucose-Capillary: 146 mg/dL — ABNORMAL HIGH (ref 70–99)
Glucose-Capillary: 81 mg/dL (ref 70–99)
Glucose-Capillary: 130 mg/dL — ABNORMAL HIGH (ref 70–99)

## 2020-08-28 LAB — MAGNESIUM: Magnesium: 1.8 mg/dL (ref 1.7–2.4)

## 2020-08-28 LAB — ECHOCARDIOGRAM COMPLETE
AR max vel: 3.25 cm2
AV Area VTI: 3.3 cm2
AV Area mean vel: 3.08 cm2
AV Mean grad: 4.6 mmHg
AV Peak grad: 8.7 mmHg
Ao pk vel: 1.47 m/s
Area-P 1/2: 3.3 cm2
Height: 63 in
P 1/2 time: 344 msec
S' Lateral: 2.4 cm
Weight: 2432 oz

## 2020-08-28 LAB — PROCALCITONIN: Procalcitonin: 0.26 ng/mL

## 2020-08-28 MED ORDER — FERROUS SULFATE 325 (65 FE) MG PO TABS: 325.0000 mg | ORAL_TABLET | Freq: Every day | ORAL | 3 refills | Status: DC

## 2020-08-28 MED ORDER — AMOXICILLIN-POT CLAVULANATE 875-125 MG PO TABS: 1.0000 | ORAL_TABLET | Freq: Two times a day (BID) | ORAL | 0 refills | Status: DC

## 2020-08-28 MED ORDER — APIXABAN 5 MG PO TABS: 5.0000 mg | ORAL_TABLET | Freq: Two times a day (BID) | ORAL | Status: DC

## 2020-08-28 MED ORDER — AMOXICILLIN-POT CLAVULANATE 875-125 MG PO TABS: 1.0000 | ORAL_TABLET | Freq: Two times a day (BID) | ORAL | 0 refills | Status: AC

## 2020-08-28 MED ORDER — DOCUSATE SODIUM 100 MG PO CAPS: 100.0000 mg | ORAL_CAPSULE | Freq: Every day | ORAL | 2 refills | Status: DC

## 2020-08-28 MED ORDER — GLUCERNA SHAKE PO LIQD: 237.0000 mL | Freq: Three times a day (TID) | ORAL | 0 refills | Status: DC

## 2020-08-28 MED ORDER — AZITHROMYCIN 250 MG PO TABS: 500.0000 mg | ORAL_TABLET | Freq: Every day | ORAL | Status: DC

## 2020-08-28 NOTE — Evaluation (Signed)
Physical Therapy Evaluation Patient Details Name: Christina Fry MRN: [id number] DOB: 1940-07-01 Today's Date: 08/28/2020   History of Present Illness  Christina Fry is a 80 y.o. female with medical history significant for PMR, HTN, hypothyroidism, diabetes mellitus, Graves' disease, asthma.  Patient was brought to the ED from PCPs office with report of near syncopal episode.  Patient reports that this has been ongoing for about 2 months, she has had several episodes of nearly passing out, all of these alcohol when she is standing this has never happened when she is lying down.  She has never actually passed out.  She denies chest pain.  She is yet to have this evaluated by her primary care provider.  She reports chronic poor oral intake, with nausea, no vomiting, no change in bowel habits.   Clinical Impression  Patient functioning near baseline for functioning mobility and gait. Patient able to complete mobility without assist and denies any symptoms with tranfers. Patient demonstrates generalized weakness but is able to ambulate in room with RW without loss of balance. Patient discharged to care of nursing for ambulation daily as tolerated for length of stay.     Follow Up Recommendations No PT follow up    Equipment Recommendations  None recommended by PT    Recommendations for Other Services       Precautions / Restrictions Precautions Precautions: Fall Restrictions Weight Bearing Restrictions: No      Mobility  Bed Mobility Overal bed mobility: Modified Independent                  Transfers Overall transfer level: Modified independent Equipment used: Rolling walker (2 wheeled)             General transfer comment: no c/o symptoms  Ambulation/Gait Ambulation/Gait assistance: Modified independent (Device/Increase time) Gait Distance (Feet): 15 Feet Assistive device: Rolling walker (2 wheeled) Gait Pattern/deviations: Decreased stride length;Step-through  pattern        Stairs            Wheelchair Mobility    Modified Rankin (Stroke Patients Only)       Balance Overall balance assessment: Needs assistance Sitting-balance support: No upper extremity supported Sitting balance-Leahy Scale: Good Sitting balance - Comments: seated EOB   Standing balance support: Bilateral upper extremity supported Standing balance-Leahy Scale: Good Standing balance comment: good/fair with RW                             Pertinent Vitals/Pain Pain Assessment: No/denies pain    Home Living Family/patient expects to be discharged to:: Private residence Living Arrangements: Alone Available Help at Discharge: Family;Available PRN/intermittently Type of Home: House       Home Layout: One level Home Equipment: [place] - 4 wheels;Cane - single point;Shower seat;Bedside commode;Walker - 2 wheels;Electric scooter;Grab bars - toilet;Grab bars - tub/shower Additional Comments: uses adjustable bed    Prior Function Level of Independence: Needs assistance   Gait / Transfers Assistance Needed: household ambulator using Rollator, WC  ADL's / Homemaking Assistance Needed: family assist with community ADLs        Hand Dominance        Extremity/Trunk Assessment   Upper Extremity Assessment Upper Extremity Assessment: Overall WFL for tasks assessed    Lower Extremity Assessment Lower Extremity Assessment: Generalized weakness       Communication   Communication: No difficulties  Cognition Arousal/Alertness: Awake/alert Behavior During Therapy: WFL for tasks assessed/performed  Overall Cognitive Status: Within Functional Limits for tasks assessed                                        General Comments      Exercises     Assessment/Plan    PT Assessment Patent does not need any further PT services  PT Problem List         PT Treatment Interventions      PT Goals (Current goals can be found in  the Care Plan section)  Acute Rehab PT Goals Patient Stated Goal: Return home PT Goal Formulation: With patient Time For Goal Achievement: 08/28/20 Potential to Achieve Goals: Good    Frequency     Barriers to discharge        Co-evaluation               AM-PAC PT "6 Clicks" Mobility  Outcome Measure Help needed turning from your back to your side while in a flat bed without using bedrails?: None Help needed moving from lying on your back to sitting on the side of a flat bed without using bedrails?: None Help needed moving to and from a bed to a chair (including a wheelchair)?: None Help needed standing up from a chair using your arms (e.g., wheelchair or bedside chair)?: None Help needed to walk in hospital room?: None Help needed climbing 3-5 steps with a railing? : A Lot 6 Click Score: 22    End of Session   Activity Tolerance: Patient tolerated treatment well;Patient limited by fatigue Patient left: in bed;with call bell/phone within reach;Other (comment) (with MD) Nurse Communication: Mobility status PT Visit Diagnosis: Unsteadiness on feet (R26.81);Other abnormalities of gait and mobility (R26.89);Muscle weakness (generalized) (M62.81)    Time: 0865-7846 PT Time Calculation (min) (ACUTE ONLY): 12 min   Charges:   PT Evaluation $PT Eval Low Complexity: 1 Low          10:44 AM, 08/28/20 Mearl Latin PT, DPT Physical Therapist at [facility]

## 2020-08-28 NOTE — Discharge Summary (Signed)
Physician Discharge Summary  Christina Fry RJJ:[id number] DOB: April 22, 1940 DOA: 08/26/2020  PCP: Ferdie Ping, MD  Admit date: 08/26/2020  Discharge date: 08/28/2020  Admitted From:Home  Disposition:  Home  Recommendations for Outpatient Follow-up:  Follow up with PCP in 1-2 weeks Continue on Augmentin as prescribed for 5 more days to complete course of treatment for pneumonia Continue on iron supplementation as prescribed Continue other home medications as prior  Home Health: None  Equipment/Devices: None  Discharge Condition:Stable  CODE STATUS: DNR  Diet recommendation: Heart Healthy/carb modified  Brief/Interim Summary: Christina Fry is a 80 y.o. female with medical history significant for PMR, HTN, hypothyroidism, diabetes mellitus, Graves' disease, asthma. Patient was brought to the ED from PCPs office with report of near syncopal episode.  Patient was admitted with symptoms of near syncope for the last 2 months as well as some possible pneumonia as she was having coughing at home.  She is noted to be orthostatic and had received IV fluids during the course of the stay.  She was also noted to have some profound anemia related to iron deficiency, but with no overt bleeding.  She was treated with IV antibiotics and IV fluid with significant improvement in her symptoms on the day of discharge.  She will be transition to oral antibiotics at home and will be provided iron supplementation.  She has ambulated with physical therapy with no other acute concerns noted.  She is stable for discharge and will follow up with her PCP at the [facility].  Discharge Diagnoses:  Principal Problem:   Near syncope Active Problems:   PMR (polymyalgia rheumatica) (HCC)   Essential hypertension   Hypothyroidism   Pulmonary embolism (HCC)  Principal discharge diagnosis: Recurrent near syncope secondary to orthostasis.  Discharge Instructions  Discharge Instructions     Diet - low sodium heart  healthy   Complete by: As directed    Increase activity slowly   Complete by: As directed       Allergies as of 08/28/2020       Reactions   Iohexol Shortness Of Breath   Pt stopped breathing at [place]. Pt refuses IV dye   Codeine Itching   Morphine And Related Itching   Nitrofuran Derivatives Other (See Comments)   Unknown   Oxycodone-acetaminophen Itching        Medication List     STOP taking these medications    amLODipine 5 MG tablet Commonly known as: NORVASC   aspirin 81 MG chewable tablet   fentaNYL 12 MCG/HR Commonly known as: DURAGESIC   insulin aspart 100 UNIT/ML injection Commonly known as: novoLOG       TAKE these medications    acetaminophen 325 MG tablet Commonly known as: TYLENOL Take 650 mg by mouth in the morning and at bedtime.   acyclovir 200 MG capsule Commonly known as: Zovirax Take 1 capsule (200 mg total) by mouth 2 (two) times daily.   albuterol 108 (90 Base) MCG/ACT inhaler Commonly known as: VENTOLIN HFA Inhale 3 puffs into the lungs every 6 (six) hours as needed for wheezing or shortness of breath.   amoxicillin-clavulanate 875-125 MG tablet Commonly known as: Augmentin Take 1 tablet by mouth 2 (two) times daily for 5 days.   apixaban 5 MG Tabs tablet Commonly known as: ELIQUIS Take 5 mg by mouth 2 (two) times daily.   atorvastatin 20 MG tablet Commonly known as: LIPITOR Take 20 mg by mouth at bedtime.   budesonide 32  MCG/ACT nasal spray Commonly known as: RHINOCORT AQUA one spray per nostril daily (aim for the ear on each side   calcium-vitamin D 500-200 MG-UNIT tablet Commonly known as: OSCAL WITH D Take 1 tablet by mouth daily with breakfast.   carboxymethylcellulose 1 % ophthalmic solution Apply 1-4 drops to eye every 2 (two) hours while awake. 3-4 drops in right eye, 1-2 drops in left eye every 2 hours until bedtime.   cholecalciferol 25 MCG (1000 UNIT) tablet Commonly known as: VITAMIN D3 Take 1,000  Units by mouth daily.   Docusate Sodium 100 MG capsule Take 100 mg by mouth 2 (two) times daily. What changed: Another medication with the same name was added. Make sure you understand how and when to take each.   docusate sodium 100 MG capsule Commonly known as: Colace Take 1 capsule (100 mg total) by mouth daily. What changed: You were already taking a medication with the same name, and this prescription was added. Make sure you understand how and when to take each.   EPINEPHrine 0.3 mg/0.3 mL Soaj injection Commonly known as: EPI-PEN Inject 0.3 mg into the muscle as needed for anaphylaxis.   feeding supplement (GLUCERNA SHAKE) Liqd Take 237 mLs by mouth 3 (three) times daily between meals.   ferrous sulfate 325 (65 FE) MG tablet Take 1 tablet (325 mg total) by mouth daily.   fluticasone 50 MCG/ACT nasal spray Commonly known as: FLONASE Place 1 spray into both nostrils 2 (two) times daily.   gabapentin 300 MG capsule Commonly known as: NEURONTIN Take 1 capsule (300 mg total) by mouth 3 (three) times daily. What changed:  how much to take when to take this   hydrOXYzine 10 MG tablet Commonly known as: ATARAX/VISTARIL Take 1 tablet (10 mg total) by mouth 2 (two) times daily as needed for itching.   ipratropium 0.06 % nasal spray Commonly known as: ATROVENT Place 2 sprays into both nostrils 3 (three) times daily.   levothyroxine 75 MCG tablet Commonly known as: SYNTHROID Take 75 mcg by mouth daily before breakfast. 30 minutes prior to meal or patient will not eat meal   loratadine 10 MG tablet Commonly known as: CLARITIN Take 1 tablet (10 mg total) by mouth daily as needed for allergies.   metoprolol tartrate 25 MG tablet Commonly known as: LOPRESSOR Take 1 tablet (25 mg total) by mouth 2 (two) times daily.   nortriptyline 25 MG capsule Commonly known as: PAMELOR Take 25 mg by mouth at bedtime.   omeprazole 20 MG capsule Commonly known as: PRILOSEC Take 20 mg  by mouth 2 (two) times daily.   polyethylene glycol 17 g packet Commonly known as: MIRALAX / GLYCOLAX Take 17 g by mouth daily.   predniSONE 5 MG tablet Commonly known as: DELTASONE Take 5 mg by mouth daily with breakfast. What changed: Another medication with the same name was removed. Continue taking this medication, and follow the directions you see here.   traMADol 50 MG tablet Commonly known as: ULTRAM Take 50 mg by mouth every 6 (six) hours as needed for moderate pain.        Follow-up Information     D'Silva, Belva Crome, MD. Schedule an appointment as soon as possible for a visit in 1 week(s).   Specialty: Internal Medicine Contact information: [address] [place] [address] [place] [address] [phone]                Allergies  Allergen Reactions   Iohexol Shortness Of Breath  Pt stopped breathing at [place]. Pt refuses IV dye   Codeine Itching   Morphine And Related Itching   Nitrofuran Derivatives Other (See Comments)    Unknown   Oxycodone-Acetaminophen Itching    Consultations: None   Procedures/Studies: US Carotid Bilateral  Result Date: 08/27/2020 CLINICAL DATA:  80 year old with near syncope. EXAM: BILATERAL CAROTID DUPLEX ULTRASOUND TECHNIQUE: Pearline Cables scale imaging, color Doppler and duplex ultrasound were performed of bilateral carotid and vertebral arteries in the neck. COMPARISON:  11/23/2019 FINDINGS: Criteria: Quantification of carotid stenosis is based on velocity parameters that correlate the residual internal carotid diameter with NASCET-based stenosis levels, using the diameter of the distal internal carotid lumen as the denominator for stenosis measurement. The following velocity measurements were obtained: RIGHT ICA: 165/44 cm/sec CCA: 563/14 cm/sec SYSTOLIC ICA/CCA RATIO:  1.6 ECA: 106 cm/sec LEFT ICA: 191/60 cm/sec CCA: 970/26 cm/sec SYSTOLIC ICA/CCA RATIO:  1.6 ECA: 88 cm/sec RIGHT CAROTID ARTERY: Small amount of plaque at the right  carotid bulb is similar to the previous examination. External carotid artery is patent with normal waveform. Peak systolic velocity in the proximal internal carotid artery is elevated measuring 165 cm/sec. No significant stenosis in the proximal internal carotid artery based on the grayscale imaging. Mid and distal internal carotid artery are patent but tortuous. RIGHT VERTEBRAL ARTERY: Antegrade flow and normal waveform in the right vertebral artery. LEFT CAROTID ARTERY: Small amount of plaque at the left carotid bulb. External carotid artery is patent with normal waveform. Peak systolic velocity in the distal left internal carotid artery is elevated measuring 191 cm/sec but this could be associated with tortuosity rather than true stenosis. LEFT VERTEBRAL ARTERY: Antegrade flow and normal waveform in the left vertebral artery. IMPRESSION: 1. 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 Peak systolic velocities are elevated in both internal carotid arteries but the carotid artery ratios are less than 2 bilaterally. Difficult to accurately measure the degree of stenosis. Suspect the degree of stenosis is less than 50% based on the carotid artery ratios and grayscale imaging but cannot exclude greater than 50% stenosis based on the velocities. Recommend follow-up surveillance. 2. Patent vertebral arteries with antegrade flow. Electronically Signed   By: Markus Daft M.D.   On: 08/27/2020 13:33   DG Chest Portable 1 View  Result Date: 08/26/2020 CLINICAL DATA:  80 year old female with near syncope. EXAM: PORTABLE CHEST 1 VIEW COMPARISON:  Chest radiograph dated 11/23/2019. FINDINGS: Left lung base atelectasis. Infiltrate is less likely but not excluded. Clinical correlation is recommended. No pleural effusion pneumothorax. Stable cardiac silhouette. Atherosclerotic calcification of the aorta. Osteopenia with degenerative changes of the spine and shoulders. Old healed left humeral neck fracture.  No acute osseous pathology. IMPRESSION: Left lung base atelectasis versus infiltrate. Electronically Signed   By: Anner Crete M.D.   On: 08/26/2020 15:47   ECHOCARDIOGRAM COMPLETE  Result Date: 08/28/2020    ECHOCARDIOGRAM REPORT   Patient Name:   Christina Fry [facility] Date of Exam: 08/28/2020 Medical Rec #:  [id number]    Height:       63.0 in Accession #:    [id number]   Weight:       152.0 lb Date of Birth:  1941-01-18     BSA:          1.721 m Patient Age:    52 years     BP:           113/45 mmHg Patient Gender: F  HR:           88 bpm. Exam Location:  [facility] Procedure: 2D Echo, Cardiac Doppler and Color Doppler Indications:    R55 Syncope  History:        Patient has prior history of Echocardiogram examinations, most                 recent 11/23/2019. TIA; Risk Factors:Hypertension. GERD.  Sonographer:    Alvino Chapel RCS Referring Phys: [id number] [place]  1. Left ventricular ejection fraction, by estimation, is 60 to 65%. The left ventricle has normal function. The left ventricle has no regional wall motion abnormalities. There is moderate left ventricular hypertrophy. Left ventricular diastolic parameters are consistent with Grade I diastolic dysfunction (impaired relaxation).  2. Right ventricular systolic function is normal. The right ventricular size is normal.  3. Left atrial size was moderately dilated.  4. The mitral valve is normal in structure. No evidence of mitral valve regurgitation. No evidence of mitral stenosis.  5. The aortic valve is tricuspid. Aortic valve regurgitation is mild. No aortic stenosis is present.  6. The inferior vena cava is normal in size with greater than 50% respiratory variability, suggesting right atrial pressure of 3 mmHg. FINDINGS  Left Ventricle: Left ventricular ejection fraction, by estimation, is 60 to 65%. The left ventricle has normal function. The left ventricle has no regional wall motion abnormalities. The left ventricular internal  cavity size was normal in size. There is  moderate left ventricular hypertrophy. Left ventricular diastolic parameters are consistent with Grade I diastolic dysfunction (impaired relaxation). Normal left ventricular filling pressure. Right Ventricle: The right ventricular size is normal. No increase in right ventricular wall thickness. Right ventricular systolic function is normal. Left Atrium: Left atrial size was moderately dilated. Right Atrium: Right atrial size was normal in size. Pericardium: There is no evidence of pericardial effusion. Mitral Valve: The mitral valve is normal in structure. No evidence of mitral valve regurgitation. No evidence of mitral valve stenosis. Tricuspid Valve: The tricuspid valve is normal in structure. Tricuspid valve regurgitation is not demonstrated. No evidence of tricuspid stenosis. Aortic Valve: The aortic valve is tricuspid. Aortic valve regurgitation is mild. Aortic regurgitation PHT measures 344 msec. No aortic stenosis is present. Aortic valve mean gradient measures 4.6 mmHg. Aortic valve peak gradient measures 8.7 mmHg. Aortic  valve area, by VTI measures 3.30 cm. Pulmonic Valve: The pulmonic valve was not well visualized. Pulmonic valve regurgitation is not visualized. No evidence of pulmonic stenosis. Aorta: The aortic root is normal in size and structure. Venous: The inferior vena cava is normal in size with greater than 50% respiratory variability, suggesting right atrial pressure of 3 mmHg. IAS/Shunts: No atrial level shunt detected by color flow Doppler.  LEFT VENTRICLE PLAX 2D LVIDd:         3.70 cm  Diastology LVIDs:         2.40 cm  LV e' medial:    7.07 cm/s LV PW:         1.20 cm  LV E/e' medial:  13.0 LV IVS:        1.30 cm  LV e' lateral:   8.38 cm/s LVOT diam:     2.10 cm  LV E/e' lateral: 11.0 LV SV:         99 LV SV Index:   58 LVOT Area:     3.46 cm  RIGHT VENTRICLE RV S prime:     13.50 cm/s TAPSE (  M-mode): 2.2 cm LEFT ATRIUM             Index        RIGHT ATRIUM           Index LA diam:        3.30 cm 1.92 cm/m  RA Area:     10.90 cm LA Vol (A2C):   40.1 ml 23.30 ml/m RA Volume:   20.70 ml  12.03 ml/m LA Vol (A4C):   61.4 ml 35.68 ml/m LA Biplane Vol: 50.8 ml 29.52 ml/m  AORTIC VALVE AV Area (Vmax):    3.25 cm AV Area (Vmean):   3.08 cm AV Area (VTI):     3.30 cm AV Vmax:           147.18 cm/s AV Vmean:          100.242 cm/s AV VTI:            0.300 m AV Peak Grad:      8.7 mmHg AV Mean Grad:      4.6 mmHg LVOT Vmax:         138.00 cm/s LVOT Vmean:        89.000 cm/s LVOT VTI:          0.286 m LVOT/AV VTI ratio: 0.95 AI PHT:            344 msec  AORTA Ao Root diam: 3.20 cm MITRAL VALVE MV Area (PHT): 3.30 cm     SHUNTS MV Decel Time: 230 msec     Systemic VTI:  0.29 m MV E velocity: 92.20 cm/s   Systemic Diam: 2.10 cm MV A velocity: 121.00 cm/s MV E/A ratio:  0.76 Carlyle Dolly MD Electronically signed by Carlyle Dolly MD Signature Date/Time: 08/28/2020/11:18:23 AM    Final      Discharge Exam: Vitals:   08/27/20 2106 08/28/20 0414  BP: 124/89 (!) 113/45  Pulse: 91 94  Resp: 20 19  Temp: 98.1 F (36.7 C) 98.1 F (36.7 C)  SpO2: 98% 92%   Vitals:   08/27/20 0435 08/27/20 1355 08/27/20 2106 08/28/20 0414  BP: 106/60  124/89 (!) 113/45  Pulse: 78  91 94  Resp: 19 18 20 19   Temp: 97.6 F (36.4 C) 98.9 F (37.2 C) 98.1 F (36.7 C) 98.1 F (36.7 C)  TempSrc:  Oral Oral   SpO2: 94% 96% 98% 92%  Weight:      Height:        General: Pt is alert, awake, not in acute distress Cardiovascular: RRR, S1/S2 +, no rubs, no gallops Respiratory: CTA bilaterally, no wheezing, no rhonchi Abdominal: Soft, NT, ND, bowel sounds + Extremities: no edema, no cyanosis    The results of significant diagnostics from this hospitalization (including imaging, microbiology, ancillary and laboratory) are listed below for reference.     Microbiology: Recent Results (from the past 240 hour(s))  Resp Panel by RT-PCR (Flu A&B, Covid)  Nasopharyngeal Swab     Status: None   Collection Time: 08/26/20  3:30 PM   Specimen: Nasopharyngeal Swab; Nasopharyngeal(NP) swabs in vial transport medium  Result Value Ref Range Status   SARS Coronavirus 2 by RT PCR NEGATIVE NEGATIVE Final    Comment: (NOTE) SARS-CoV-2 target nucleic acids are NOT DETECTED.  The SARS-CoV-2 RNA is generally detectable in upper respiratory specimens during the acute phase of infection. The lowest concentration of SARS-CoV-2 viral copies this assay can detect is 138 copies/mL. A negative result does not preclude SARS-Cov-2  infection and should not be used as the sole basis for treatment or other patient management decisions. A negative result may occur with  improper specimen collection/handling, submission of specimen other than nasopharyngeal swab, presence of viral mutation(s) within the areas targeted by this assay, and inadequate number of viral copies(<138 copies/mL). A negative result must be combined with clinical observations, patient history, and epidemiological information. The expected result is Negative.  Fact Sheet for Patients:  EntrepreneurPulse.com.au  Fact Sheet for Healthcare Providers:  IncredibleEmployment.be  This test is no t yet approved or cleared by the [place] FDA and  has been authorized for detection and/or diagnosis of SARS-CoV-2 by FDA under an Emergency Use Authorization (EUA). This EUA will remain  in effect (meaning this test can be used) for the duration of the COVID-19 declaration under Section 564(b)(1) of the Act, 21 U.S.C.section 360bbb-3(b)(1), unless the authorization is terminated  or revoked sooner.       Influenza A by PCR NEGATIVE NEGATIVE Final   Influenza B by PCR NEGATIVE NEGATIVE Final    Comment: (NOTE) The Xpert Xpress SARS-CoV-2/FLU/RSV plus assay is intended as an aid in the diagnosis of influenza from Nasopharyngeal swab specimens and should not be  used as a sole basis for treatment. Nasal washings and aspirates are unacceptable for Xpert Xpress SARS-CoV-2/FLU/RSV testing.  Fact Sheet for Patients: EntrepreneurPulse.com.au  Fact Sheet for Healthcare Providers: IncredibleEmployment.be  This test is not yet approved or cleared by the [place] FDA and has been authorized for detection and/or diagnosis of SARS-CoV-2 by FDA under an Emergency Use Authorization (EUA). This EUA will remain in effect (meaning this test can be used) for the duration of the COVID-19 declaration under Section 564(b)(1) of the Act, 21 U.S.C. section 360bbb-3(b)(1), unless the authorization is terminated or revoked.  Performed at [facility], [address], [place], [place] [address]      Labs: BNP (last 3 results) No results for input(s): BNP in the last 8760 hours. Basic Metabolic Panel: Recent Labs  Lab 08/26/20 1623 08/27/20 0638 08/28/20 0645  NA 137 134* 142  K 4.2 4.3 4.3  CL 101 102 107  CO2 29 27 29   GLUCOSE 122* 193* 91  BUN 24* 22 17  CREATININE 1.93* 1.64* 1.28*  CALCIUM 9.0 8.0* 8.6*  MG  --   --  1.8   Liver Function Tests: Recent Labs  Lab 08/26/20 1623  AST 17  ALT 12  ALKPHOS 65  BILITOT 0.3  PROT 6.2*  ALBUMIN 3.6   Recent Labs  Lab 08/26/20 1623  LIPASE 25   No results for input(s): AMMONIA in the last 168 hours. CBC: Recent Labs  Lab 08/26/20 1623 08/27/20 0638 08/27/20 1243 08/28/20 0645  WBC 15.9* 22.5*  --  10.5  NEUTROABS 10.9*  --   --   --   HGB 9.6* 8.9* 9.4* 8.6*  HCT 32.3* 30.2* 32.0* 29.0*  MCV 100.9* 102.7*  --  101.0*  PLT 346 309  --  294   Cardiac Enzymes: No results for input(s): CKTOTAL, CKMB, CKMBINDEX, TROPONINI in the last 168 hours. BNP: Invalid input(s): POCBNP CBG: Recent Labs  Lab 08/27/20 1104 08/27/20 1618 08/27/20 2109 08/28/20 0717 08/28/20 1105  GLUCAP 79 166* 76 81 130*   D-Dimer No results for input(s): DDIMER in  the last 72 hours. Hgb A1c Recent Labs    08/26/20 1624 08/26/20 2128  HGBA1C 6.3* 6.2*   Lipid Profile No results for input(s): CHOL, HDL, LDLCALC, TRIG,  CHOLHDL, LDLDIRECT in the last 72 hours. Thyroid function studies No results for input(s): TSH, T4TOTAL, T3FREE, THYROIDAB in the last 72 hours.  Invalid input(s): FREET3 Anemia work up Recent Labs    08/26/20 1624 08/27/20 0638  VITAMINB12 168*  --   FOLATE 8.8  --   FERRITIN 9*  --   TIBC 364  --   IRON 25*  --   RETICCTPCT  --  1.5   Urinalysis    Component Value Date/Time   COLORURINE YELLOW 12/29/2010 1210   APPEARANCEUR CLEAR 12/29/2010 1210   LABSPEC 1.030 12/29/2010 1210   PHURINE 6.0 12/29/2010 1210   GLUCOSEU NEGATIVE 12/29/2010 1210   HGBUR NEGATIVE 12/29/2010 1210   BILIRUBINUR small (A) 11/22/2019 1426   KETONESUR trace (5) (A) 11/22/2019 1426   KETONESUR 15 (A) 12/29/2010 1210   PROTEINUR =30 (A) 11/22/2019 1426   PROTEINUR 30 (A) 12/29/2010 1210   UROBILINOGEN 0.2 11/22/2019 1426   UROBILINOGEN 0.2 12/29/2010 1210   NITRITE Negative 11/22/2019 1426   NITRITE NEGATIVE 12/29/2010 1210   LEUKOCYTESUR Negative 11/22/2019 1426   Sepsis Labs Invalid input(s): PROCALCITONIN,  WBC,  LACTICIDVEN Microbiology Recent Results (from the past 240 hour(s))  Resp Panel by RT-PCR (Flu A&B, Covid) Nasopharyngeal Swab     Status: None   Collection Time: 08/26/20  3:30 PM   Specimen: Nasopharyngeal Swab; Nasopharyngeal(NP) swabs in vial transport medium  Result Value Ref Range Status   SARS Coronavirus 2 by RT PCR NEGATIVE NEGATIVE Final    Comment: (NOTE) SARS-CoV-2 target nucleic acids are NOT DETECTED.  The SARS-CoV-2 RNA is generally detectable in upper respiratory specimens during the acute phase of infection. The lowest concentration of SARS-CoV-2 viral copies this assay can detect is 138 copies/mL. A negative result does not preclude SARS-Cov-2 infection and should not be used as the sole basis for  treatment or other patient management decisions. A negative result may occur with  improper specimen collection/handling, submission of specimen other than nasopharyngeal swab, presence of viral mutation(s) within the areas targeted by this assay, and inadequate number of viral copies(<138 copies/mL). A negative result must be combined with clinical observations, patient history, and epidemiological information. The expected result is Negative.  Fact Sheet for Patients:  EntrepreneurPulse.com.au  Fact Sheet for Healthcare Providers:  IncredibleEmployment.be  This test is no t yet approved or cleared by the [place] FDA and  has been authorized for detection and/or diagnosis of SARS-CoV-2 by FDA under an Emergency Use Authorization (EUA). This EUA will remain  in effect (meaning this test can be used) for the duration of the COVID-19 declaration under Section 564(b)(1) of the Act, 21 U.S.C.section 360bbb-3(b)(1), unless the authorization is terminated  or revoked sooner.       Influenza A by PCR NEGATIVE NEGATIVE Final   Influenza B by PCR NEGATIVE NEGATIVE Final    Comment: (NOTE) The Xpert Xpress SARS-CoV-2/FLU/RSV plus assay is intended as an aid in the diagnosis of influenza from Nasopharyngeal swab specimens and should not be used as a sole basis for treatment. Nasal washings and aspirates are unacceptable for Xpert Xpress SARS-CoV-2/FLU/RSV testing.  Fact Sheet for Patients: EntrepreneurPulse.com.au  Fact Sheet for Healthcare Providers: IncredibleEmployment.be  This test is not yet approved or cleared by the [place] FDA and has been authorized for detection and/or diagnosis of SARS-CoV-2 by FDA under an Emergency Use Authorization (EUA). This EUA will remain in effect (meaning this test can be used) for the duration of the COVID-19 declaration under  Section 564(b)(1) of the Act, 21  U.S.C. section 360bbb-3(b)(1), unless the authorization is terminated or revoked.  Performed at [facility], [address], [place],  [address]      Time coordinating discharge: 35 minutes  SIGNED:   Rodena Goldmann, DO [facility] 08/28/2020, 12:42 PM  If 7PM-7AM, please contact night-coverage [email or web address]

## 2020-08-28 NOTE — Progress Notes (Signed)
*  PRELIMINARY RESULTS* Echocardiogram 2D Echocardiogram has been performed.  Christina Fry 08/28/2020, 9:32 AM

## 2020-08-28 NOTE — TOC Initial Note (Signed)
Transition of Care [facility] - Initial/Assessment Note    Patient Details  Name: Christina Fry MRN: [id number] Date of Birth: [birth year]  Transition of Care [facility] CM/SW Contact:    Boneta Lucks, RN Phone Number: 08/28/2020, 1:52 PM  Clinical Narrative:    Patient admitted with near syncope with a high risk for readmission. Patient lives at home with her daughter. One level living with all equipment needed. PT says no needs. Patient discharging home with daughter today.             Expected Discharge Plan: Home/Self Care Barriers to Discharge: Barriers Resolved   Patient Goals and CMS Choice Patient states their goals for this hospitalization and ongoing recovery are:: to go home. CMS Medicare.gov Compare Post Acute Care list provided to:: Patient Represenative (must comment) Choice offered to / list presented to : Adult Children  Expected Discharge Plan and Services Expected Discharge Plan: Home/Self Care       Living arrangements for the past 2 months: Single Family Home Expected Discharge Date: 08/28/20                                    Prior Living Arrangements/Services Living arrangements for the past 2 months: Single Family Home Lives with:: Adult Children Patient language and need for interpreter reviewed:: Yes        Need for Family Participation in Patient Care: Yes (Comment) Care giver support system in place?: Yes (comment)   Criminal Activity/Legal Involvement Pertinent to Current Situation/Hospitalization: No - Comment as needed  Activities of Daily Living Home Assistive Devices/Equipment: Eyeglasses, Wheelchair, Environmental consultant (specify type), Cane (specify quad or straight), Bedside commode/3-in-1, Shower chair with back ADL Screening (condition at time of admission) Patient's cognitive ability adequate to safely complete daily activities?: Yes Is the patient deaf or have difficulty hearing?: No Does the patient have difficulty seeing, even when wearing  glasses/contacts?: No Does the patient have difficulty concentrating, remembering, or making decisions?: No Patient able to express need for assistance with ADLs?: Yes Does the patient have difficulty dressing or bathing?: No Independently performs ADLs?: Yes (appropriate for developmental age) Does the patient have difficulty walking or climbing stairs?: No Weakness of Legs: None Weakness of Arms/Hands: None  Permission Sought/Granted                  Emotional Assessment         Alcohol / Substance Use: Not Applicable Psych Involvement: No (comment)  Admission diagnosis:  Orthostatic hypotension [I95.1] Near syncope [R55] Patient Active Problem List   Diagnosis Date Noted   Near syncope 08/26/2020   Pulmonary embolism ([place]) 08/26/2020   Weakness of both lower extremities 11/29/2019   Generalized weakness 11/23/2019   PMR (polymyalgia rheumatica) ([place]) 11/23/2019   GERD (gastroesophageal reflux disease) 11/23/2019   Fibromyalgia 11/23/2019   Essential hypertension 11/23/2019   Hypothyroidism 11/23/2019   Ambulatory dysfunction 11/23/2019   Accident due to mechanical fall without injury 11/23/2019   TIA (transient ischemic attack) 11/23/2019   Seasonal and perennial allergic rhinitis 04/04/2018   Mild intermittent asthma without complication 36/64/4034   PCP:  Ferdie Ping, MD Pharmacy:   [place], [place] - [address] [place] [address] Phone: [phone] Fax: [phone]     Social Determinants of Health (SDOH) Interventions    Readmission Risk Interventions Readmission Risk Prevention Plan 08/28/2020 12/06/2019  Transportation Screening Complete  Complete  HRI or Home Care Consult Complete -  Social Work Consult for Recovery Care Planning/Counseling Complete Complete  Palliative Care Screening Not Applicable -  Medication Review Press photographer) Complete Complete  Some recent data might be hidden

## 2020-08-31 NOTE — Telephone Encounter (Signed)
Called patient to see how she is doing since her last visit. She stated she is home from the hospital and is doing better. She plans to be in the office Wednesday for her next shot.

## 2020-09-01 NOTE — Telephone Encounter (Signed)
Great!  I forgot to chart this, but I called her daughter on the evening of her admission and she told me that she was doing better.  Salvatore Marvel, MD [facility] [place]

## 2020-11-23 DIAGNOSIS — J3081 Allergic rhinitis due to animal (cat) (dog) hair and dander: Secondary | ICD-10-CM

## 2020-11-23 NOTE — Progress Notes (Signed)
VIALS MADE. EXP 11-23-21

## 2020-11-24 DIAGNOSIS — J3089 Other allergic rhinitis: Secondary | ICD-10-CM | POA: Diagnosis not present

## 2020-12-09 ENCOUNTER — Ambulatory Visit: Payer: Medicare Other

## 2020-12-10 ENCOUNTER — Other Ambulatory Visit: Payer: Self-pay

## 2020-12-10 ENCOUNTER — Encounter: Payer: Self-pay | Admitting: Allergy & Immunology

## 2020-12-10 ENCOUNTER — Ambulatory Visit: Payer: Medicare Other

## 2020-12-10 ENCOUNTER — Ambulatory Visit (INDEPENDENT_AMBULATORY_CARE_PROVIDER_SITE_OTHER): Payer: No Typology Code available for payment source | Admitting: Allergy & Immunology

## 2020-12-10 VITALS — BP 136/76 | HR 74

## 2020-12-10 DIAGNOSIS — J309 Allergic rhinitis, unspecified: Secondary | ICD-10-CM

## 2020-12-10 DIAGNOSIS — J302 Other seasonal allergic rhinitis: Secondary | ICD-10-CM | POA: Diagnosis not present

## 2020-12-10 DIAGNOSIS — J4520 Mild intermittent asthma, uncomplicated: Secondary | ICD-10-CM

## 2020-12-10 NOTE — Patient Instructions (Addendum)
1. Seasonal and perennial allergic rhinitis - We are restarting the allergy shots today.  - Continue with Rhincort one spray per nostril daily (aim for the ear on each side). - Continue with Claritin 10mg  as needed.  2. Gustatory rhinitis - Continue with Atrovent one spray per nostril 15 minutes or so before meals.  3. Return in about 1 year (around 12/10/2021).    Please inform us of any Emergency Department visits, hospitalizations, or changes in symptoms. Call us before going to the ED for breathing or allergy symptoms since we might be able to fit you in for a sick visit. Feel free to contact us anytime with any questions, problems, or concerns.  It was a pleasure to see you and your family again today! You look SO HAPPY! Congrats on the move!   Websites that have reliable patient information: 1. American Academy of Asthma, Allergy, and Immunology: www.aaaai.org 2. Food Allergy Research and Education (FARE): foodallergy.org 3. Mothers of Asthmatics: http://www.asthmacommunitynetwork.org 4. American College of Allergy, Asthma, and Immunology: www.acaai.org   COVID-19 Vaccine Information can be found at: ShippingScam.co.uk For questions related to vaccine distribution or appointments, please email [email or web address] or call [phone].   We realize that you might be concerned about having an allergic reaction to the COVID19 vaccines. To help with that concern, WE ARE OFFERING THE COVID19 VACCINES IN OUR OFFICE! Ask the front desk for dates!     "Like" [place] on Facebook and Instagram for our latest updates!      A healthy democracy works best when [employer] participate! Make sure you are registered to vote! If you have moved or changed any of your contact information, you will need to get this updated before voting!  In some cases, you MAY be able to register to vote online:  [email or web address]

## 2020-12-10 NOTE — Progress Notes (Signed)
Immunotherapy   Patient Details  Name: Christina Fry MRN: [id number] Date of Birth: 28-Jun-1940  12/10/2020  Christina Fry Perin restarted injections for G-W-T-C-D and Molds-CR-DM. Patient received 0.05 of both her blue vials with an expiration of 11/23/2021. Patient waited in clinic for 30 minutes with no problems. Following schedule: B  Frequency:1 time per week Epi-Pen:Epi-Pen Available  Consent signed and patient instructions given.   Herbie Drape 12/10/2020, 3:48 PM

## 2020-12-11 NOTE — Progress Notes (Signed)
FOLLOW UP  Date of Service/Encounter:  12/13/20   Assessment:   Seasonal and perennial allergic rhinitis  Mild intermittent asthma, uncomplicated  Plan/Recommendations:   1. Seasonal and perennial allergic rhinitis - We are restarting the allergy shots today.  - Continue with Rhincort one spray per nostril daily (aim for the ear on each side). - Continue with Claritin 10mg  as needed.  2. Gustatory rhinitis - Continue with Atrovent one spray per nostril 15 minutes or so before meals.  3. Return in about 1 year (around 12/10/2021).   Subjective:   Christina Fry is a 80 y.o. female presenting today for follow up of  Chief Complaint  Patient presents with   Follow-up    Christina Fry has a history of the following: Patient Active Problem List   Diagnosis Date Noted   Mild intermittent asthma, uncomplicated 63/84/6659   Near syncope 08/26/2020   Pulmonary embolism ([place]) 08/26/2020   Weakness of both lower extremities 11/29/2019   Generalized weakness 11/23/2019   PMR (polymyalgia rheumatica) ([place]) 11/23/2019   GERD (gastroesophageal reflux disease) 11/23/2019   Fibromyalgia 11/23/2019   Essential hypertension 11/23/2019   Hypothyroidism 11/23/2019   Ambulatory dysfunction 11/23/2019   Accident due to mechanical fall without injury 11/23/2019   TIA (transient ischemic attack) 11/23/2019   Seasonal and perennial allergic rhinitis 04/04/2018   Mild intermittent asthma without complication 93/57/0177    History obtained from: chart review and patient and her daughter .  Christina Fry is a 80 y.o. female presenting for a follow up visit.  She was last seen in April 2022.  At that time, we decided to restart allergy shots.  Asthma was under good control with albuterol use very rarely.  She was endorsing some gustatory rhinitis, so we started Atrovent 1 spray per nostril before meals.  In the interim, she unfortunately had an episode in June when she came in and was rather  unresponsive.  She was rushed to the hospital and it turns out she was just dehydrated.  She was discharged on antibiotics with a physical therapy follow-up.  She has done very well since that time.  She is interested in starting allergy shots again.  She was living on her daughter's property in their guest house.  She made the decision to move to an assisted living facility.  She is living in [place] now in a senior apartment.  She has a studio apartment and receives 1-2 meals from the facility.  She can cook in her apartment if she wants to.  She seems to be thoroughly enjoying it.  She seems more energetic today than I seen her in quite some time.  Her daughter is with her today, who reported earlier today that she was actually feeling more relieved about everything.  Otherwise, there have been no changes to her past medical history, surgical history, family history, or social history.    Review of Systems  Constitutional: Negative.  Negative for fever, malaise/fatigue and weight loss.  HENT: Negative.  Negative for congestion, ear discharge and ear pain.   Eyes:  Negative for pain, discharge and redness.  Respiratory:  Negative for cough, sputum production, shortness of breath and wheezing.   Cardiovascular: Negative.  Negative for chest pain and palpitations.  Gastrointestinal:  Negative for abdominal pain and heartburn.  Skin: Negative.  Negative for itching and rash.  Neurological:  Negative for dizziness and headaches.  Endo/Heme/Allergies:  Negative for environmental allergies. Does not bruise/bleed easily.  Objective:   Blood pressure 136/76, pulse 74, SpO2 97 %. There is no height or weight on file to calculate BMI.   Physical Exam:  Physical Exam Vitals reviewed.  Constitutional:      Appearance: She is well-developed.  HENT:     Head: Normocephalic and atraumatic.     Right Ear: Tympanic membrane, ear canal and external ear normal.     Left Ear: Tympanic  membrane, ear canal and external ear normal.     Nose: No nasal deformity, septal deviation, mucosal edema or rhinorrhea.     Right Turbinates: Enlarged and swollen.     Left Turbinates: Enlarged and swollen.     Right Sinus: No maxillary sinus tenderness or frontal sinus tenderness.     Left Sinus: No maxillary sinus tenderness or frontal sinus tenderness.     Mouth/Throat:     Mouth: Mucous membranes are not pale and not dry.     Pharynx: Uvula midline.  Eyes:     General: Lids are normal. Allergic shiner present.        Right eye: No discharge.        Left eye: No discharge.     Conjunctiva/sclera: Conjunctivae normal.     Right eye: Right conjunctiva is not injected. No chemosis.    Left eye: Left conjunctiva is not injected. No chemosis.    Pupils: Pupils are equal, round, and reactive to light.  Cardiovascular:     Rate and Rhythm: Normal rate and regular rhythm.     Heart sounds: Normal heart sounds.  Pulmonary:     Effort: Pulmonary effort is normal. No tachypnea, accessory muscle usage or respiratory distress.     Breath sounds: Normal breath sounds. No wheezing, rhonchi or rales.  Chest:     Chest wall: No tenderness.  Lymphadenopathy:     Cervical: No cervical adenopathy.  Skin:    Coloration: Skin is not pale.     Findings: No abrasion, erythema, petechiae or rash. Rash is not papular, urticarial or vesicular.  Neurological:     Mental Status: She is alert.  Psychiatric:        Behavior: Behavior is cooperative.     Diagnostic studies: none       Salvatore Marvel, MD  [facility] [place]

## 2020-12-13 ENCOUNTER — Encounter: Payer: Self-pay | Admitting: Allergy & Immunology

## 2020-12-13 DIAGNOSIS — J4520 Mild intermittent asthma, uncomplicated: Secondary | ICD-10-CM | POA: Insufficient documentation

## 2020-12-25 ENCOUNTER — Ambulatory Visit (INDEPENDENT_AMBULATORY_CARE_PROVIDER_SITE_OTHER): Payer: No Typology Code available for payment source | Admitting: *Deleted

## 2020-12-25 DIAGNOSIS — J309 Allergic rhinitis, unspecified: Secondary | ICD-10-CM

## 2021-01-01 ENCOUNTER — Ambulatory Visit (INDEPENDENT_AMBULATORY_CARE_PROVIDER_SITE_OTHER): Payer: No Typology Code available for payment source

## 2021-01-01 DIAGNOSIS — J309 Allergic rhinitis, unspecified: Secondary | ICD-10-CM | POA: Diagnosis not present

## 2021-01-14 ENCOUNTER — Ambulatory Visit (INDEPENDENT_AMBULATORY_CARE_PROVIDER_SITE_OTHER): Payer: No Typology Code available for payment source | Admitting: *Deleted

## 2021-01-14 DIAGNOSIS — J309 Allergic rhinitis, unspecified: Secondary | ICD-10-CM | POA: Diagnosis not present

## 2021-01-20 ENCOUNTER — Ambulatory Visit (INDEPENDENT_AMBULATORY_CARE_PROVIDER_SITE_OTHER): Payer: No Typology Code available for payment source

## 2021-01-20 DIAGNOSIS — J309 Allergic rhinitis, unspecified: Secondary | ICD-10-CM | POA: Diagnosis not present

## 2021-01-27 ENCOUNTER — Ambulatory Visit (INDEPENDENT_AMBULATORY_CARE_PROVIDER_SITE_OTHER): Payer: No Typology Code available for payment source

## 2021-01-27 DIAGNOSIS — J309 Allergic rhinitis, unspecified: Secondary | ICD-10-CM

## 2021-02-03 ENCOUNTER — Ambulatory Visit (INDEPENDENT_AMBULATORY_CARE_PROVIDER_SITE_OTHER): Payer: No Typology Code available for payment source

## 2021-02-03 DIAGNOSIS — J309 Allergic rhinitis, unspecified: Secondary | ICD-10-CM | POA: Diagnosis not present

## 2021-02-23 IMAGING — CR DG HIP (WITH OR WITHOUT PELVIS) 2-3V*L*
3 series · 3 of 3 positions shown · non-contrast
Comparison: None Available.

CLINICAL DATA: Fall, pain and deformity, initial encounter.

EXAM:
DG HIP (WITH OR WITHOUT PELVIS) 2-3V LEFT

[pelvis ap]
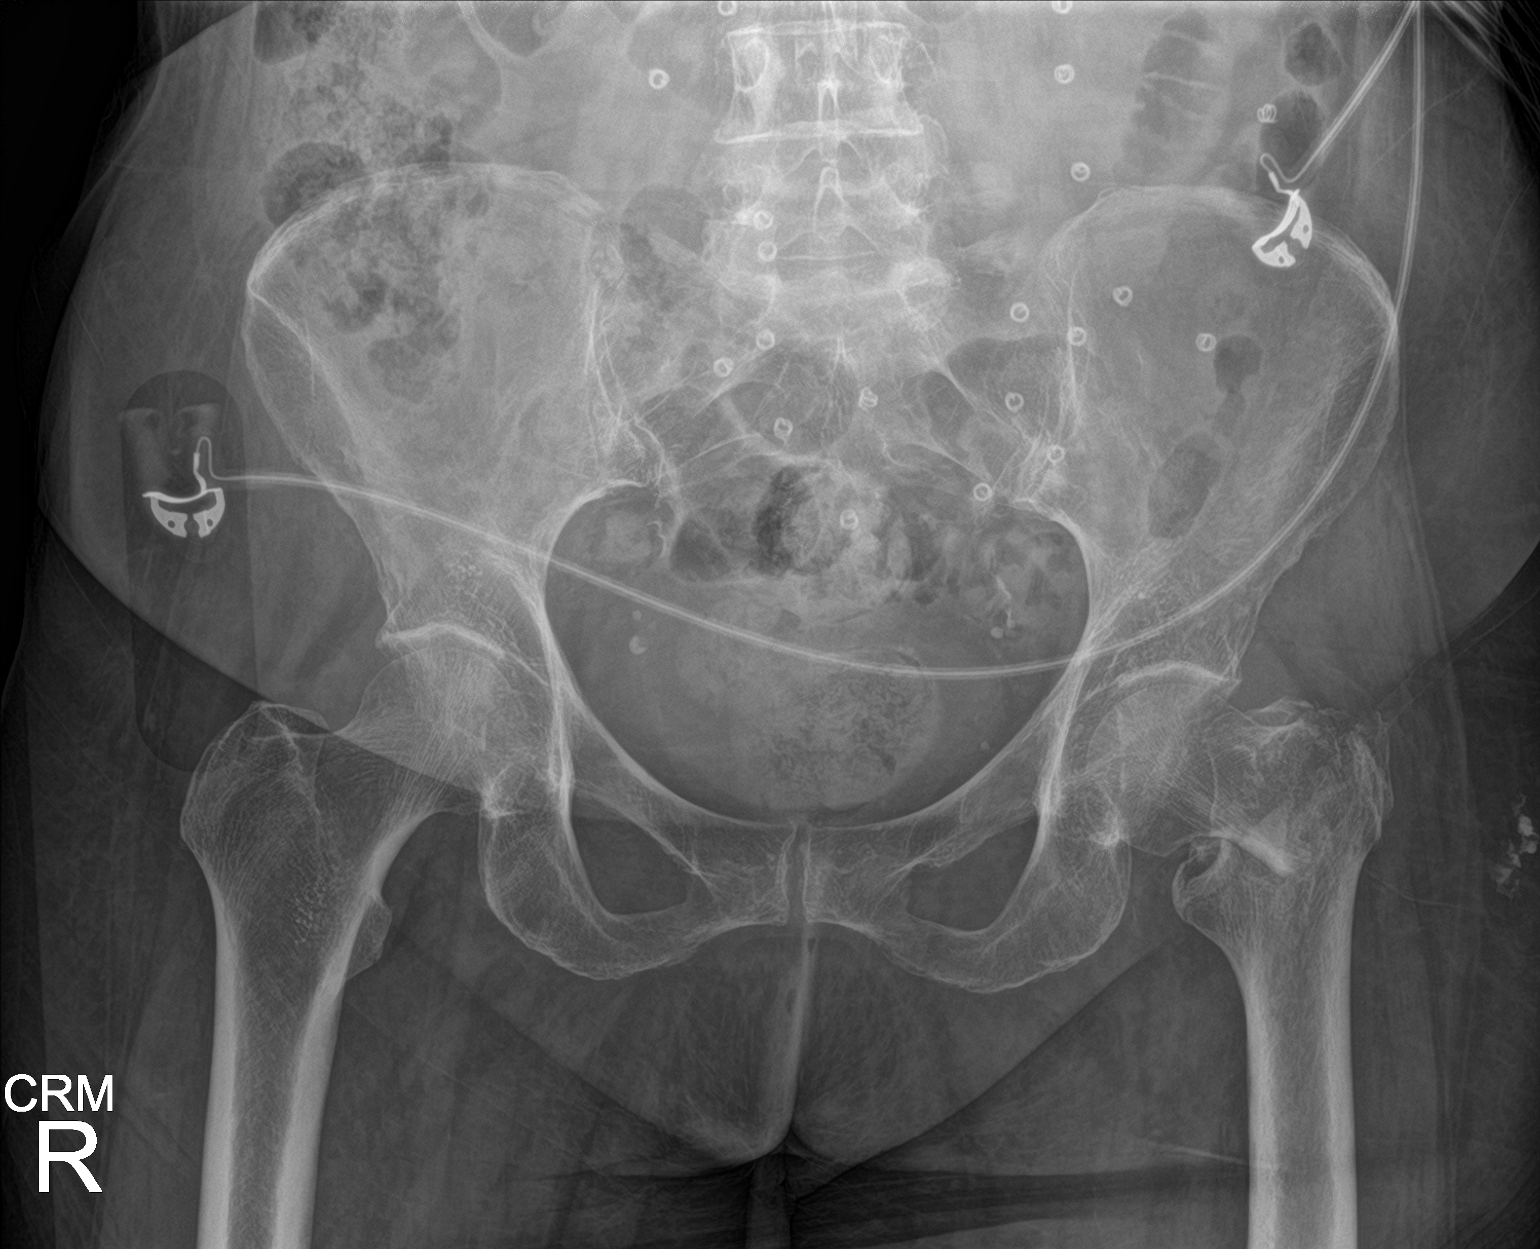

[hip ap]
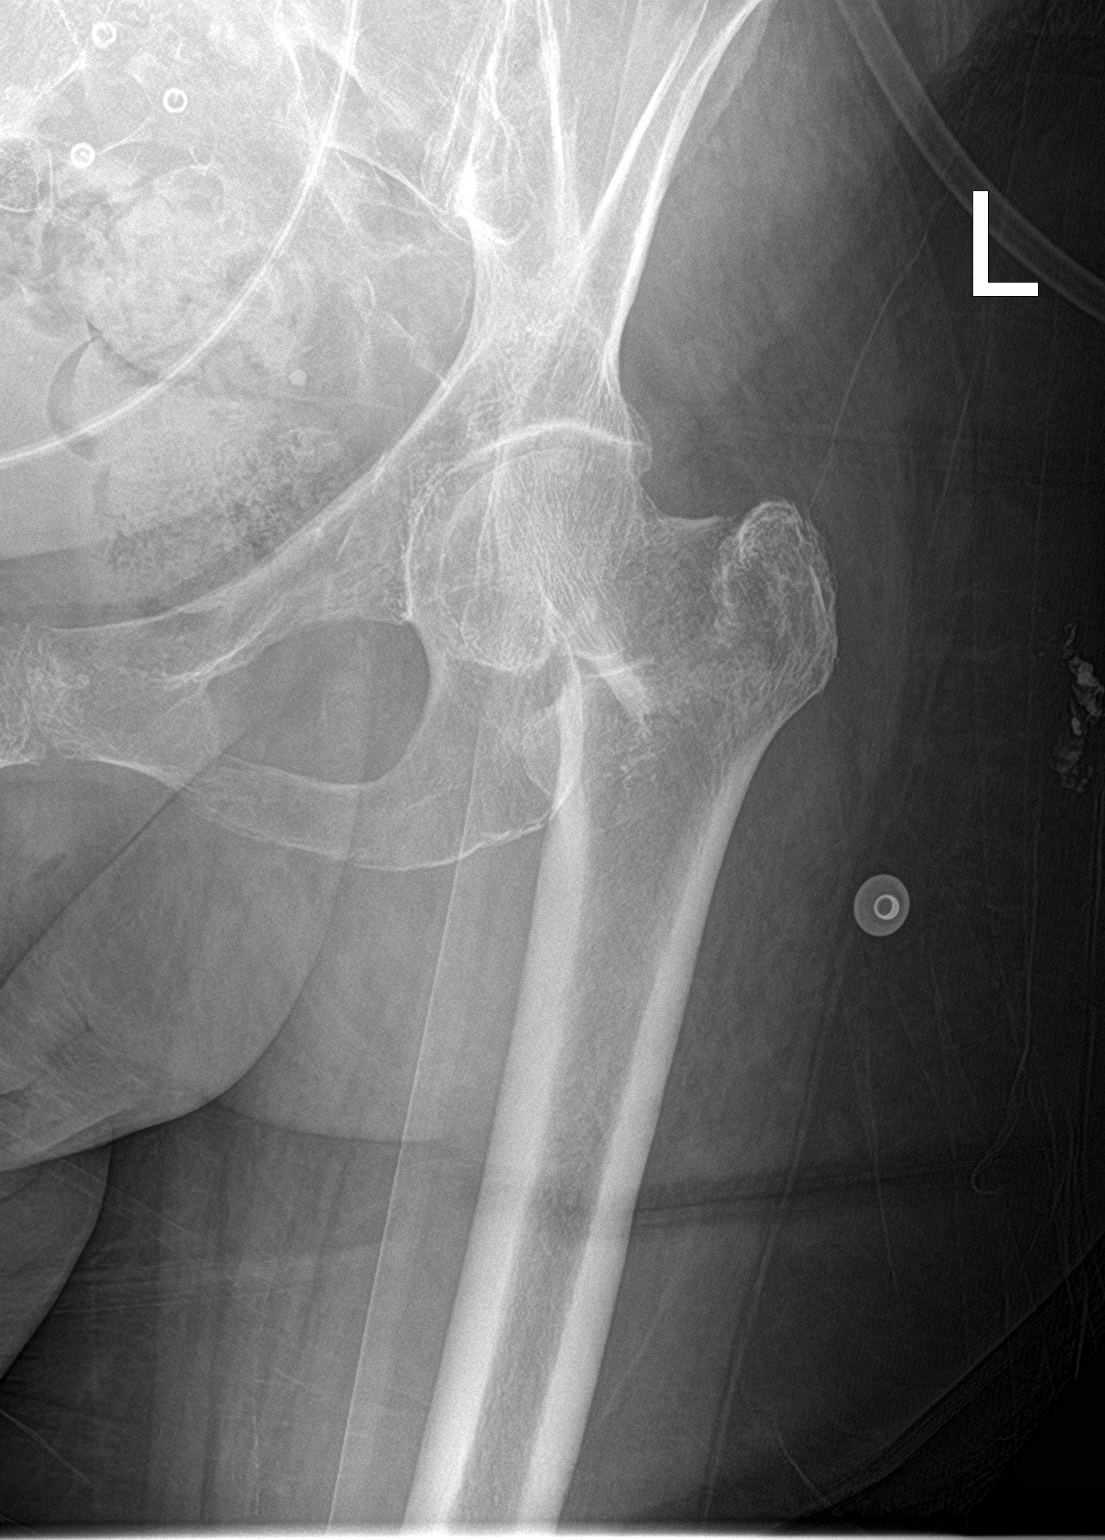

[hip lat]
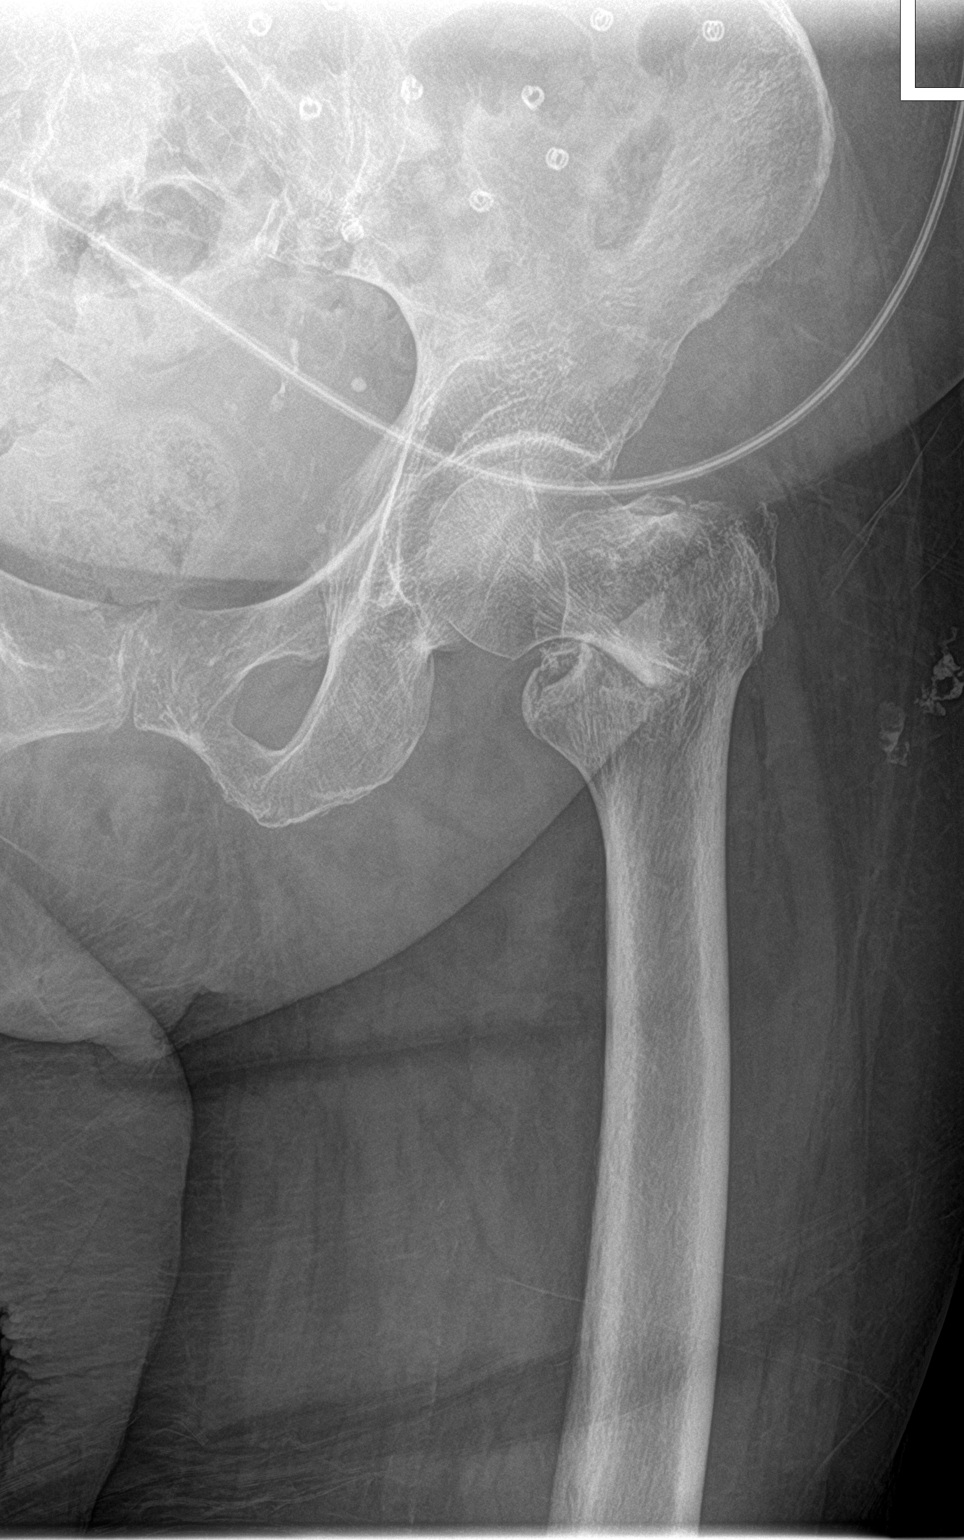

[3 of 3 positions shown; findings below may reference images not displayed]

FINDINGS: Mildly comminuted inter trochanteric fracture the left femur with
varus angulation. No dislocation. Mild sclerosis along the roof of
the right acetabulum.
IMPRESSION: Left intertrochanteric femur fracture with varus angulation.

## 2021-03-29 ENCOUNTER — Ambulatory Visit (INDEPENDENT_AMBULATORY_CARE_PROVIDER_SITE_OTHER): Payer: No Typology Code available for payment source

## 2021-03-29 DIAGNOSIS — J309 Allergic rhinitis, unspecified: Secondary | ICD-10-CM

## 2021-04-12 ENCOUNTER — Ambulatory Visit (INDEPENDENT_AMBULATORY_CARE_PROVIDER_SITE_OTHER): Payer: No Typology Code available for payment source

## 2021-04-12 DIAGNOSIS — J309 Allergic rhinitis, unspecified: Secondary | ICD-10-CM | POA: Diagnosis not present

## 2021-04-12 MED ORDER — EPINEPHRINE 0.3 MG/0.3ML IJ SOAJ: 0.3000 mg | INTRAMUSCULAR | 1 refills | Status: AC | PRN

## 2021-04-12 NOTE — Progress Notes (Signed)
Patient came in today and stated that she needed a refill on her Epipen. Refill has been sent to the [facility] per her request.

## 2021-04-28 ENCOUNTER — Emergency Department (HOSPITAL_COMMUNITY): Payer: No Typology Code available for payment source

## 2021-04-28 ENCOUNTER — Inpatient Hospital Stay (HOSPITAL_COMMUNITY)
Admission: EM | Admit: 2021-04-28 | Discharge: 2021-05-12 | DRG: 516 | Disposition: A | Discharge status: Skilled Nursing Facility | Payer: No Typology Code available for payment source | Attending: Student | Admitting: Student

## 2021-04-28 ENCOUNTER — Encounter (HOSPITAL_COMMUNITY): Payer: Self-pay

## 2021-04-28 DIAGNOSIS — E0500 Thyrotoxicosis with diffuse goiter without thyrotoxic crisis or storm: Secondary | ICD-10-CM | POA: Diagnosis present

## 2021-04-28 DIAGNOSIS — F419 Anxiety disorder, unspecified: Secondary | ICD-10-CM | POA: Diagnosis present

## 2021-04-28 DIAGNOSIS — Z91011 Allergy to milk products: Secondary | ICD-10-CM

## 2021-04-28 DIAGNOSIS — M4854XA Collapsed vertebra, not elsewhere classified, thoracic region, initial encounter for fracture: Principal | ICD-10-CM | POA: Diagnosis present

## 2021-04-28 DIAGNOSIS — Z79899 Other long term (current) drug therapy: Secondary | ICD-10-CM

## 2021-04-28 DIAGNOSIS — E876 Hypokalemia: Secondary | ICD-10-CM

## 2021-04-28 DIAGNOSIS — Z7901 Long term (current) use of anticoagulants: Secondary | ICD-10-CM

## 2021-04-28 DIAGNOSIS — D509 Iron deficiency anemia, unspecified: Secondary | ICD-10-CM | POA: Diagnosis present

## 2021-04-28 DIAGNOSIS — Z885 Allergy status to narcotic agent status: Secondary | ICD-10-CM

## 2021-04-28 DIAGNOSIS — E785 Hyperlipidemia, unspecified: Secondary | ICD-10-CM | POA: Diagnosis present

## 2021-04-28 DIAGNOSIS — Z66 Do not resuscitate: Secondary | ICD-10-CM | POA: Diagnosis present

## 2021-04-28 DIAGNOSIS — Z86711 Personal history of pulmonary embolism: Secondary | ICD-10-CM

## 2021-04-28 DIAGNOSIS — M797 Fibromyalgia: Secondary | ICD-10-CM | POA: Diagnosis present

## 2021-04-28 DIAGNOSIS — D539 Nutritional anemia, unspecified: Secondary | ICD-10-CM | POA: Diagnosis present

## 2021-04-28 DIAGNOSIS — E538 Deficiency of other specified B group vitamins: Secondary | ICD-10-CM | POA: Diagnosis present

## 2021-04-28 DIAGNOSIS — S22080A Wedge compression fracture of T11-T12 vertebra, initial encounter for closed fracture: Secondary | ICD-10-CM

## 2021-04-28 DIAGNOSIS — Z20822 Contact with and (suspected) exposure to covid-19: Secondary | ICD-10-CM | POA: Diagnosis present

## 2021-04-28 DIAGNOSIS — Z6831 Body mass index (BMI) 31.0-31.9, adult: Secondary | ICD-10-CM

## 2021-04-28 DIAGNOSIS — H1031 Unspecified acute conjunctivitis, right eye: Secondary | ICD-10-CM | POA: Diagnosis present

## 2021-04-28 DIAGNOSIS — M549 Dorsalgia, unspecified: Secondary | ICD-10-CM | POA: Diagnosis present

## 2021-04-28 DIAGNOSIS — Z9049 Acquired absence of other specified parts of digestive tract: Secondary | ICD-10-CM

## 2021-04-28 DIAGNOSIS — K219 Gastro-esophageal reflux disease without esophagitis: Secondary | ICD-10-CM | POA: Diagnosis present

## 2021-04-28 DIAGNOSIS — M47816 Spondylosis without myelopathy or radiculopathy, lumbar region: Secondary | ICD-10-CM | POA: Diagnosis present

## 2021-04-28 DIAGNOSIS — Z7951 Long term (current) use of inhaled steroids: Secondary | ICD-10-CM

## 2021-04-28 DIAGNOSIS — Z91048 Other nonmedicinal substance allergy status: Secondary | ICD-10-CM

## 2021-04-28 DIAGNOSIS — E440 Moderate protein-calorie malnutrition: Secondary | ICD-10-CM | POA: Diagnosis present

## 2021-04-28 DIAGNOSIS — K5900 Constipation, unspecified: Secondary | ICD-10-CM | POA: Diagnosis present

## 2021-04-28 DIAGNOSIS — E039 Hypothyroidism, unspecified: Secondary | ICD-10-CM | POA: Diagnosis present

## 2021-04-28 DIAGNOSIS — E11649 Type 2 diabetes mellitus with hypoglycemia without coma: Secondary | ICD-10-CM | POA: Diagnosis present

## 2021-04-28 DIAGNOSIS — Z993 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: Secondary | ICD-10-CM

## 2021-04-28 DIAGNOSIS — M545 Low back pain, unspecified: Secondary | ICD-10-CM

## 2021-04-28 DIAGNOSIS — I10 Essential (primary) hypertension: Secondary | ICD-10-CM | POA: Diagnosis present

## 2021-04-28 DIAGNOSIS — Z7952 Long term (current) use of systemic steroids: Secondary | ICD-10-CM

## 2021-04-28 DIAGNOSIS — E871 Hypo-osmolality and hyponatremia: Secondary | ICD-10-CM | POA: Diagnosis not present

## 2021-04-28 DIAGNOSIS — Z79890 Hormone replacement therapy (postmenopausal): Secondary | ICD-10-CM

## 2021-04-28 DIAGNOSIS — Z91041 Radiographic dye allergy status: Secondary | ICD-10-CM

## 2021-04-28 DIAGNOSIS — D649 Anemia, unspecified: Secondary | ICD-10-CM

## 2021-04-28 DIAGNOSIS — Z90710 Acquired absence of both cervix and uterus: Secondary | ICD-10-CM

## 2021-04-28 DIAGNOSIS — Z888 Allergy status to other drugs, medicaments and biological substances status: Secondary | ICD-10-CM

## 2021-04-28 LAB — CBC WITH DIFFERENTIAL/PLATELET
Abs Immature Granulocytes: 0.02 10*3/uL (ref 0.00–0.07)
Basophils Absolute: 0 10*3/uL (ref 0.0–0.1)
Basophils Relative: 1 %
Eosinophils Absolute: 0.2 10*3/uL (ref 0.0–0.5)
Eosinophils Relative: 2 %
HCT: 30.2 % — ABNORMAL LOW (ref 36.0–46.0)
Hemoglobin: 9.2 g/dL — ABNORMAL LOW (ref 12.0–15.0)
Immature Granulocytes: 0 %
Lymphocytes Relative: 22 %
Lymphs Abs: 1.9 10*3/uL (ref 0.7–4.0)
MCH: 30.2 pg (ref 26.0–34.0)
MCHC: 30.5 g/dL (ref 30.0–36.0)
MCV: 99 fL (ref 80.0–100.0)
Monocytes Absolute: 0.7 10*3/uL (ref 0.1–1.0)
Monocytes Relative: 9 %
Neutro Abs: 5.8 10*3/uL (ref 1.7–7.7)
Neutrophils Relative %: 66 %
Platelets: 189 10*3/uL (ref 150–400)
RBC: 3.05 MIL/uL — ABNORMAL LOW (ref 3.87–5.11)
RDW: 15.4 % (ref 11.5–15.5)
WBC: 8.6 10*3/uL (ref 4.0–10.5)
nRBC: 0 % (ref 0.0–0.2)

## 2021-04-28 LAB — URINALYSIS, ROUTINE W REFLEX MICROSCOPIC
Bacteria, UA: NONE SEEN
Bilirubin Urine: NEGATIVE
Glucose, UA: NEGATIVE mg/dL
Hgb urine dipstick: NEGATIVE
Ketones, ur: NEGATIVE mg/dL
Leukocytes,Ua: NEGATIVE
Nitrite: POSITIVE — AB
Protein, ur: NEGATIVE mg/dL
Specific Gravity, Urine: 1.012 (ref 1.005–1.030)
pH: 5 (ref 5.0–8.0)

## 2021-04-28 LAB — COMPREHENSIVE METABOLIC PANEL
ALT: 8 U/L (ref 0–44)
AST: 11 U/L — ABNORMAL LOW (ref 15–41)
Albumin: 2.3 g/dL — ABNORMAL LOW (ref 3.5–5.0)
Alkaline Phosphatase: 52 U/L (ref 38–126)
Anion gap: 3 — ABNORMAL LOW (ref 5–15)
BUN: 15 mg/dL (ref 8–23)
CO2: 21 mmol/L — ABNORMAL LOW (ref 22–32)
Calcium: 5.9 mg/dL — CL (ref 8.9–10.3)
Chloride: 115 mmol/L — ABNORMAL HIGH (ref 98–111)
Creatinine, Ser: 0.92 mg/dL (ref 0.44–1.00)
GFR, Estimated: >60 mL/min (ref >60)
Glucose, Bld: 67 mg/dL — ABNORMAL LOW (ref 70–99)
Potassium: 3.1 mmol/L — ABNORMAL LOW (ref 3.5–5.1)
Sodium: 139 mmol/L (ref 135–145)
Total Bilirubin: 0.2 mg/dL — ABNORMAL LOW (ref 0.3–1.2)
Total Protein: 4.2 g/dL — ABNORMAL LOW (ref 6.5–8.1)

## 2021-04-28 LAB — RENAL FUNCTION PANEL
Albumin: 3.6 g/dL (ref 3.5–5.0)
Anion gap: 5 (ref 5–15)
BUN: 17 mg/dL (ref 8–23)
CO2: 27 mmol/L (ref 22–32)
Calcium: 8.9 mg/dL (ref 8.9–10.3)
Chloride: 106 mmol/L (ref 98–111)
Creatinine, Ser: 1.03 mg/dL — ABNORMAL HIGH (ref 0.44–1.00)
GFR, Estimated: 54 mL/min — ABNORMAL LOW (ref >60)
Glucose, Bld: 143 mg/dL — ABNORMAL HIGH (ref 70–99)
Phosphorus: 3.7 mg/dL (ref 2.5–4.6)
Potassium: 4.9 mmol/L (ref 3.5–5.1)
Sodium: 138 mmol/L (ref 135–145)

## 2021-04-28 LAB — LIPASE, BLOOD: Lipase: 23 U/L (ref 11–51)

## 2021-04-28 LAB — RESP PANEL BY RT-PCR (FLU A&B, COVID) ARPGX2
Influenza A by PCR: NEGATIVE
Influenza B by PCR: NEGATIVE
SARS Coronavirus 2 by RT PCR: NEGATIVE

## 2021-04-28 LAB — MAGNESIUM: Magnesium: 1.5 mg/dL — ABNORMAL LOW (ref 1.7–2.4)

## 2021-04-28 IMAGING — CT CT L SPINE W/O CM
3 of 4 series · 16 of 33 positions shown, 19 images · non-contrast
Comparison: Lumbar spine CT [DATE]

CLINICAL DATA: Back pain beginning last night after standing up
from a couch.



[Series 503: <mpr thick range> · coronal · 0.51mm/px · 3 of 64 slices shown]
[im 13/64  bone]
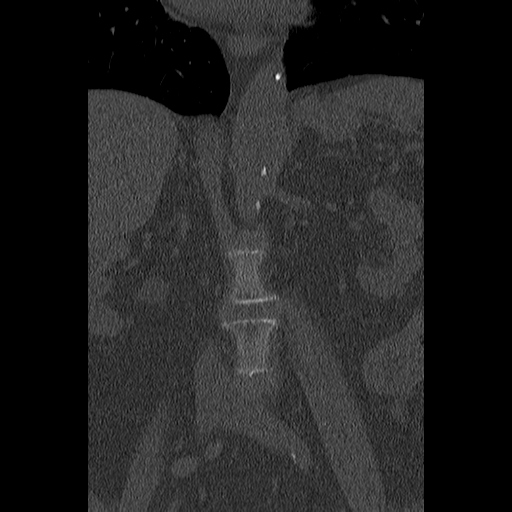
[im 26/64  bone]
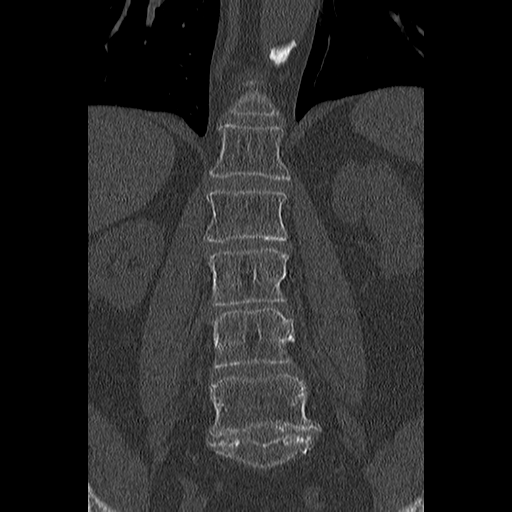
[im 38/64  bone]
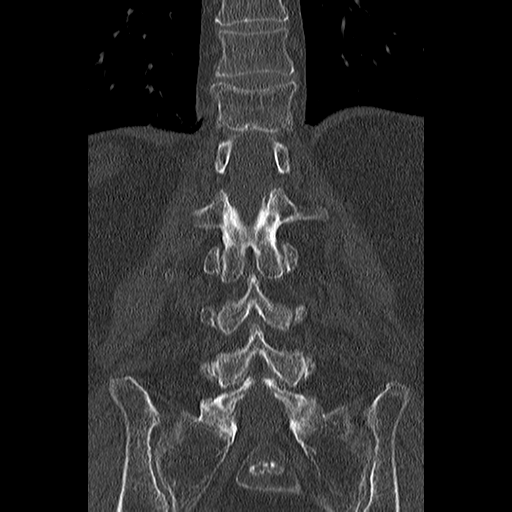

[Series 505: <mpr thick range(2)> · sagittal · 0.51mm/px · 5 of 50 slices shown, 6 images]
[im 17/50  bone]
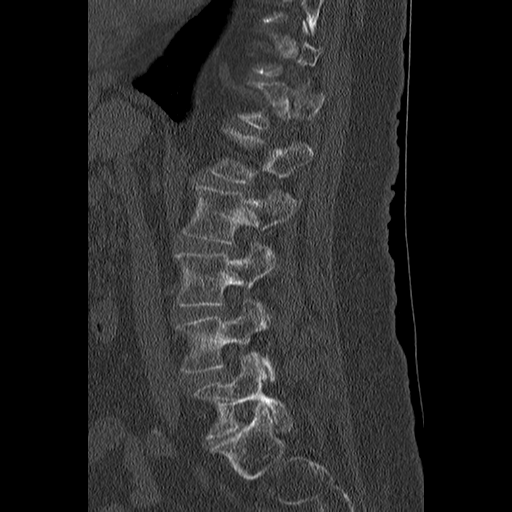
[im 21/50  bone]
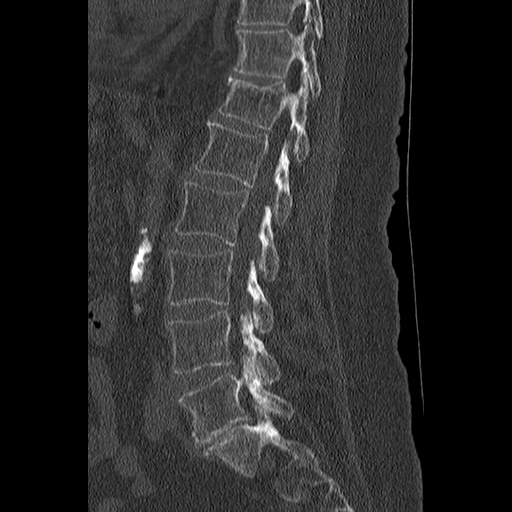
[im 25/50  soft-tissue]
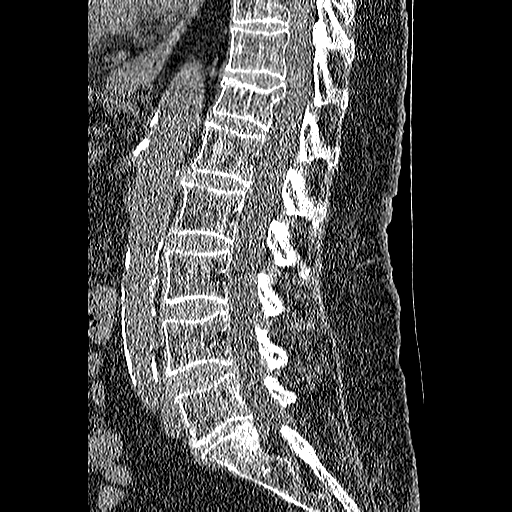
[im 25/50  bone]
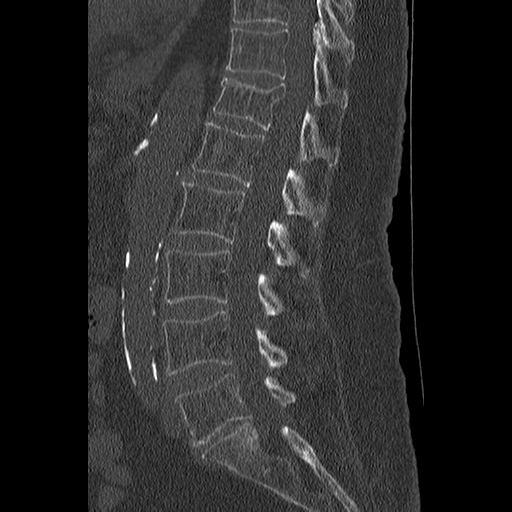
[im 29/50  bone]
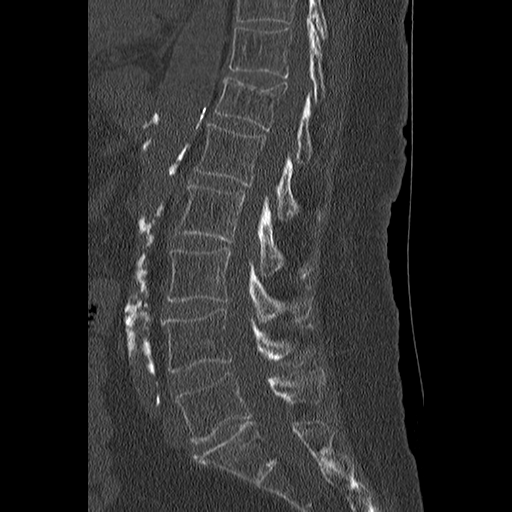
[im 33/50  bone]
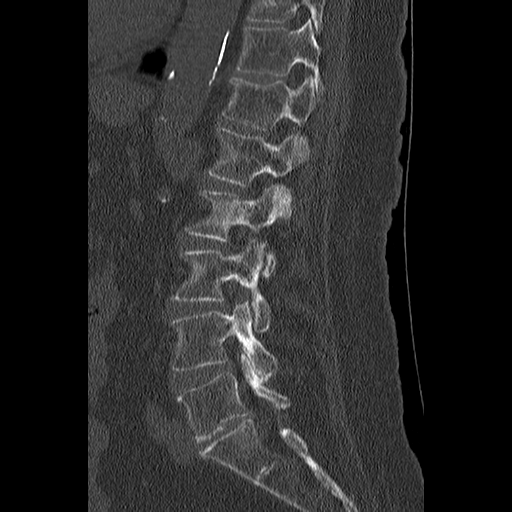

[Series 506: <mpr thick range(3)> · axial · 0.51mm/px · z∈[-263,-83]mm · 8 of 116 slices shown, 10 images]
[im 13/116  soft-tissue]
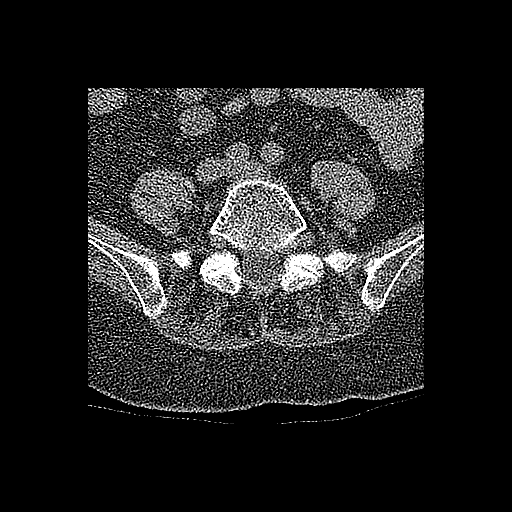
[im 13/116  bone]
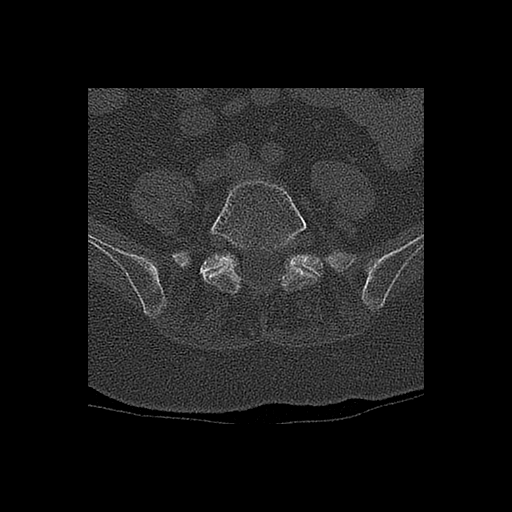
[im 26/116  bone]
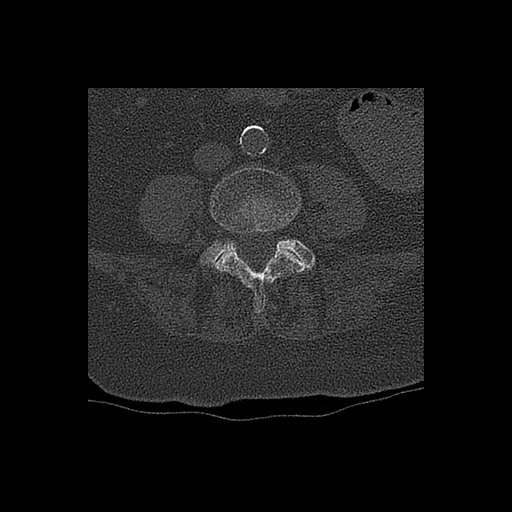
[im 39/116  bone]
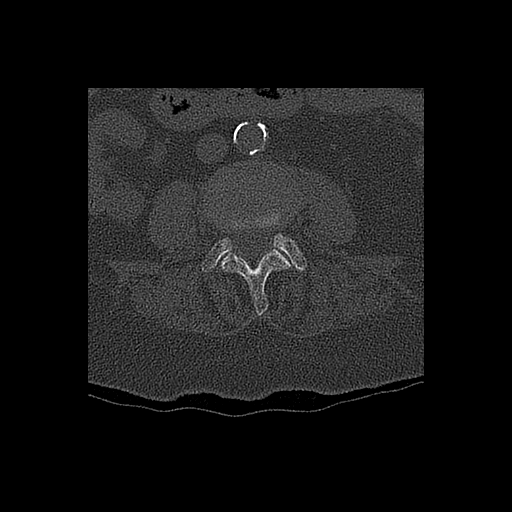
[im 52/116  bone]
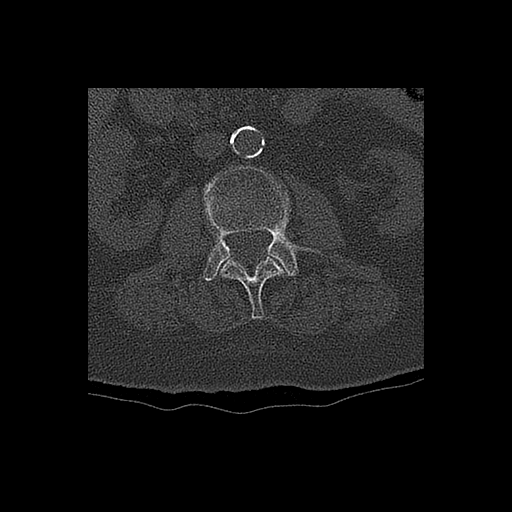
[im 64/116  soft-tissue]
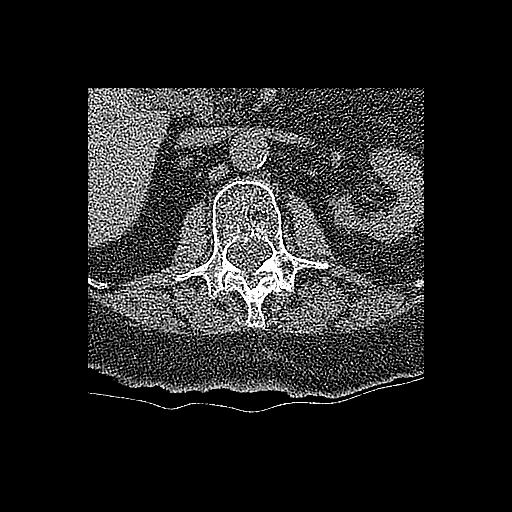
[im 64/116  bone]
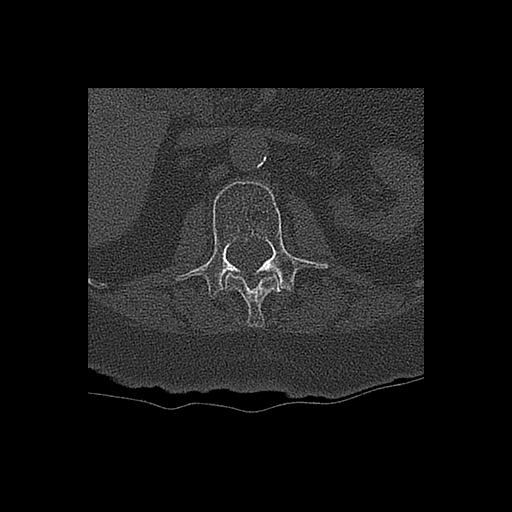
[im 77/116  bone]
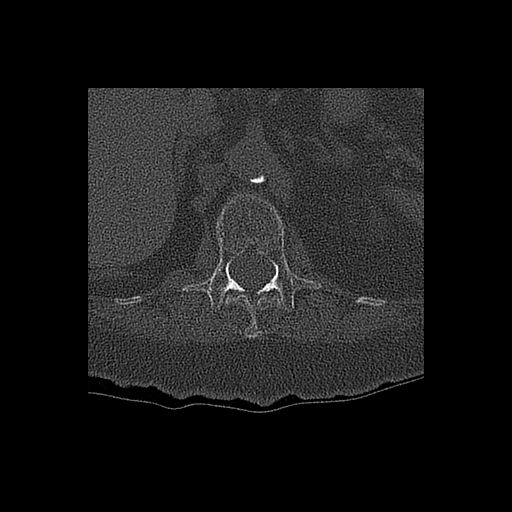
[im 90/116  bone]
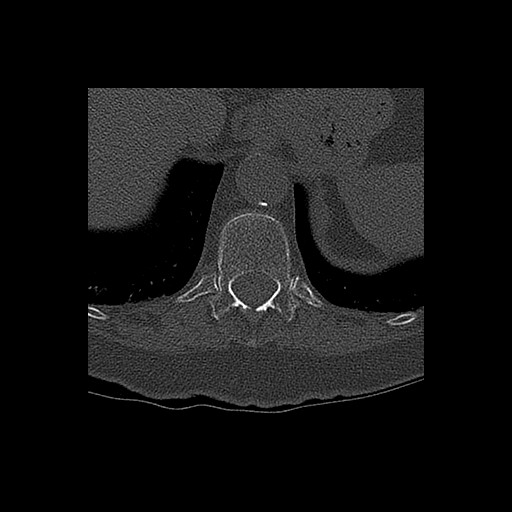
[im 103/116  bone]
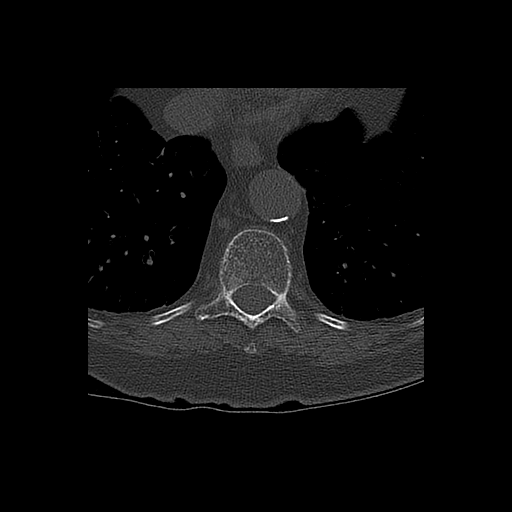

[16 of 33 positions shown; findings below may reference images not displayed]

FINDINGS: Segmentation: 5 lumbar type vertebrae.

Alignment: Slight right convex curvature of the upper lumbar spine.
No significant listhesis.

Vertebrae: T12 superior endplate compression fracture with 15%
vertebral body height loss, new from [J4] and age-indeterminate but
potentially recent. No lumbar spine fracture. No suspicious osseous
lesion. Diffuse osteopenia.

Paraspinal and other soft tissues: No acute abnormality in the
paraspinal soft tissues. Intra-abdominal and pelvic contents
reported separately.

Disc levels: Moderate disc space narrowing at L5-S1 with preserved
disc space heights elsewhere. Mild-to-moderate lower lumbar facet
arthrosis. No evidence of high-grade spinal canal or neural
foraminal stenosis.
IMPRESSION: 1. Mild T12 compression fracture, new from [J4] and of indeterminate
acuity but potentially recent. MRI could be performed for further
evaluation if clinically indicated.
2. No lumbar spine fracture.

## 2021-04-28 IMAGING — CT CT ABD-PELV W/O CM
2 of 4 series · 16 of 46 positions shown, 18 images · non-contrast
Comparison: [DATE].

CLINICAL DATA: Acute back pain.



[Series 3: axial st · axial · 0.70mm/px · z∈[-433,-73]mm · 13 of 80 slices shown, 15 images]
[im 4/80  soft-tissue]
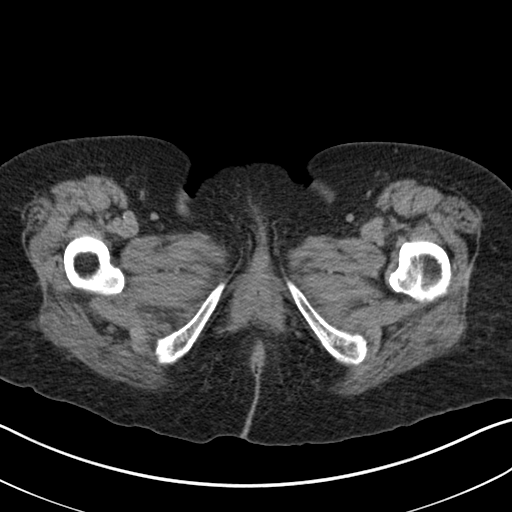
[im 4/80  bone]
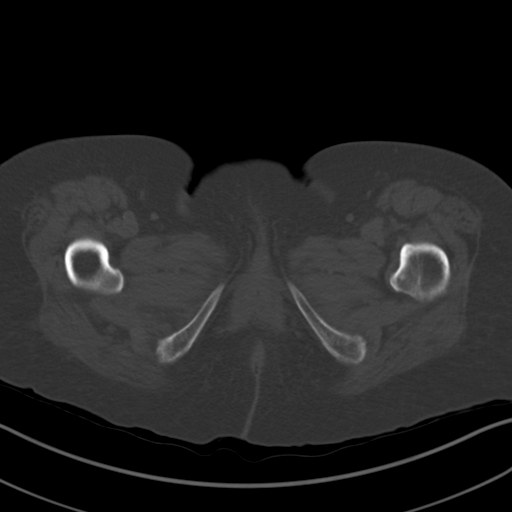
[im 12/80  soft-tissue]
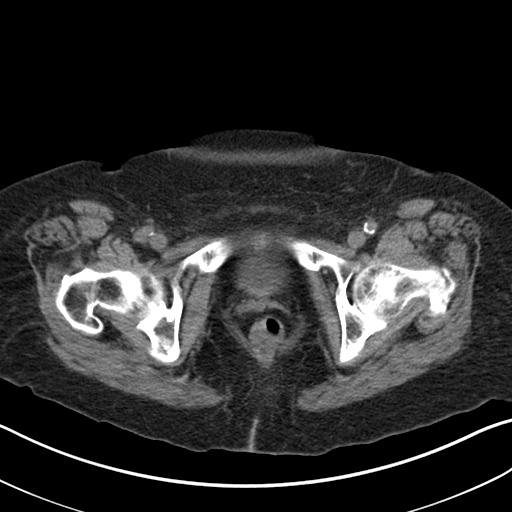
[im 16/80  soft-tissue]
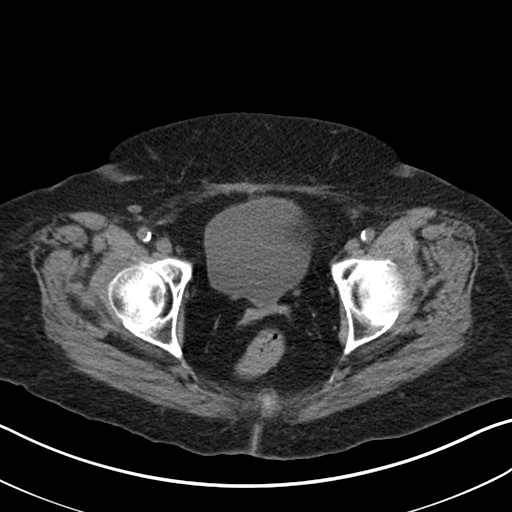
[im 24/80  soft-tissue]
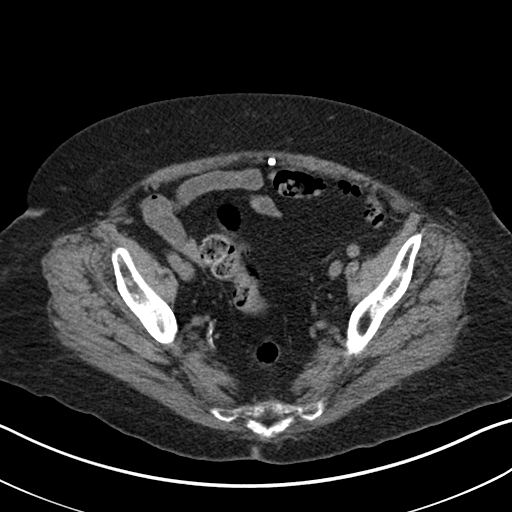
[im 28/80  soft-tissue]
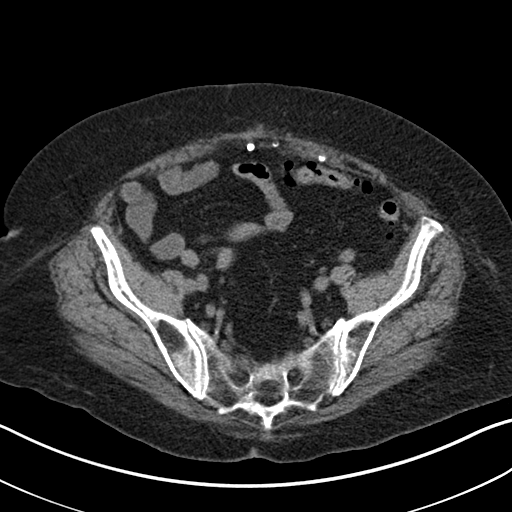
[im 36/80  soft-tissue]
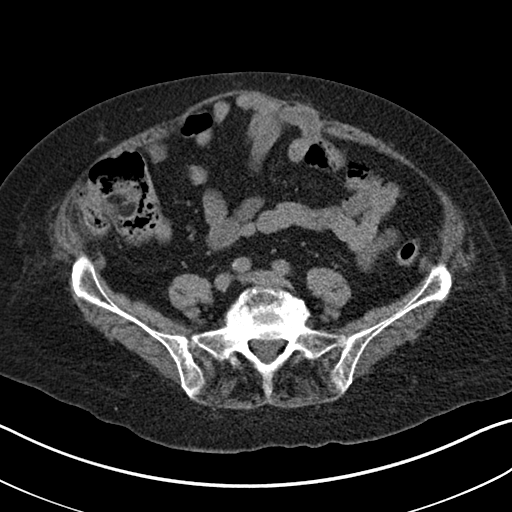
[im 40/80  soft-tissue]
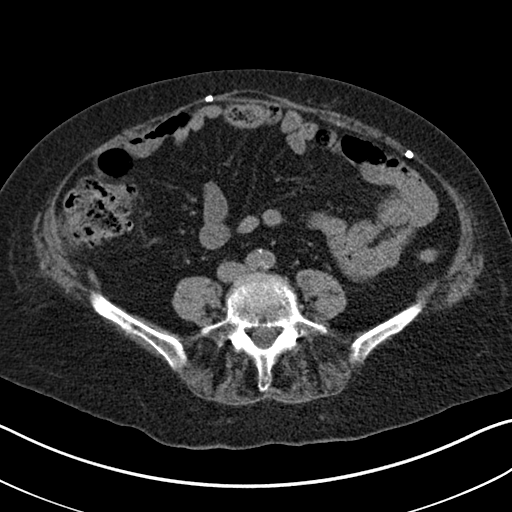
[im 44/80  soft-tissue]
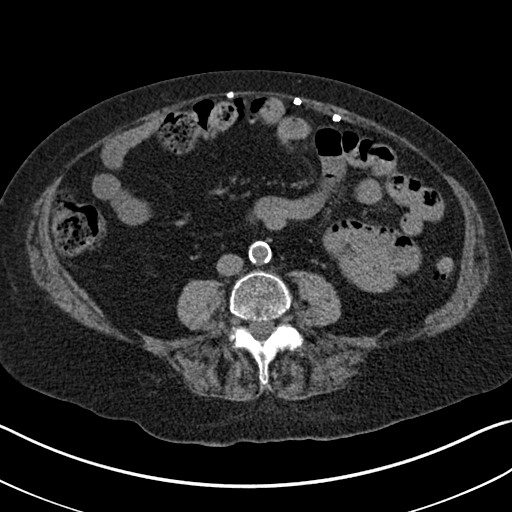
[im 52/80  soft-tissue]
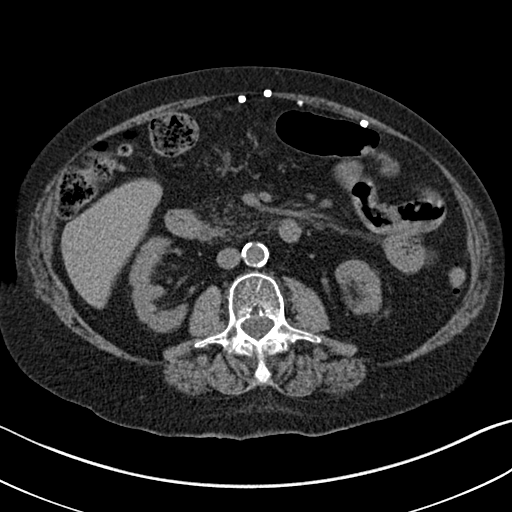
[im 52/80  bone]
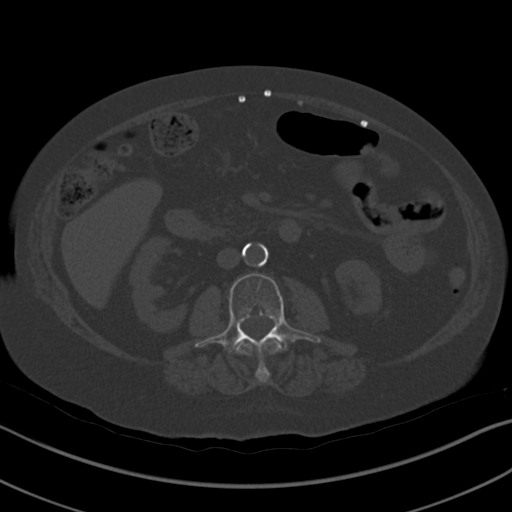
[im 56/80  soft-tissue]
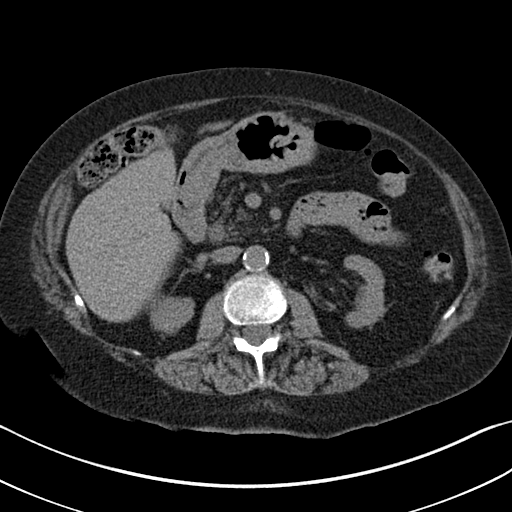
[im 64/80  soft-tissue]
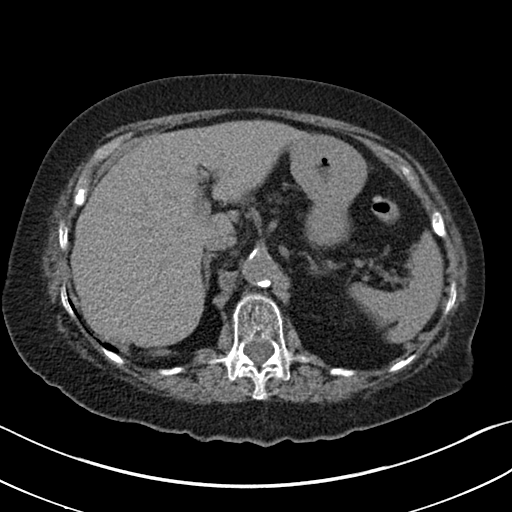
[im 68/80  soft-tissue]
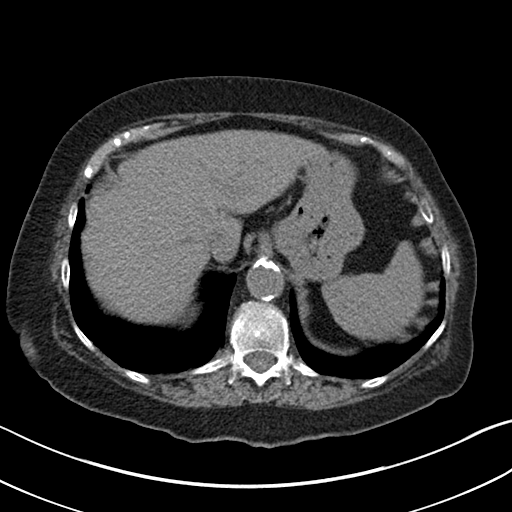
[im 76/80  soft-tissue]
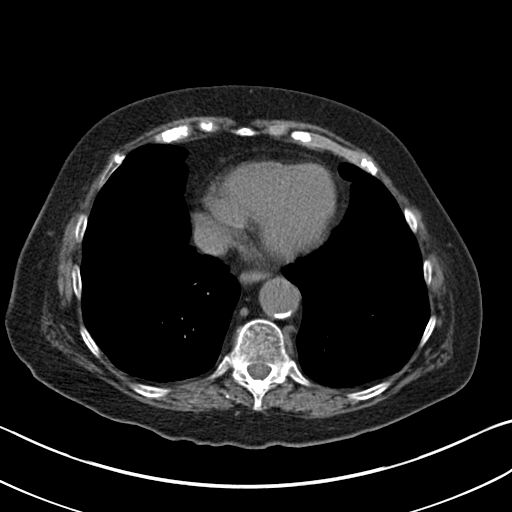

[Series 6: coronal st · coronal · 0.76mm/px · 3 of 140 slices shown]
[im 47/140  soft-tissue]
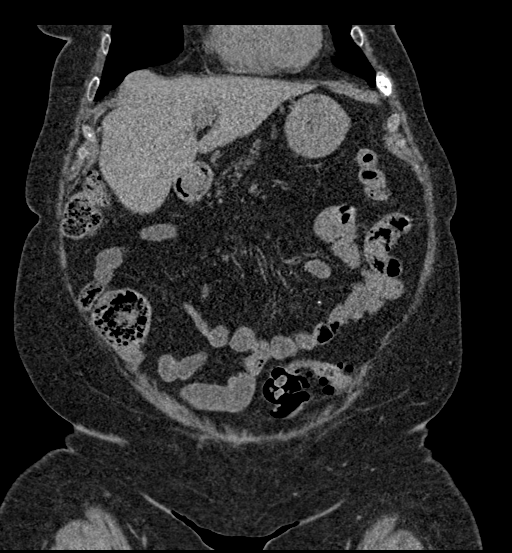
[im 62/140  soft-tissue]
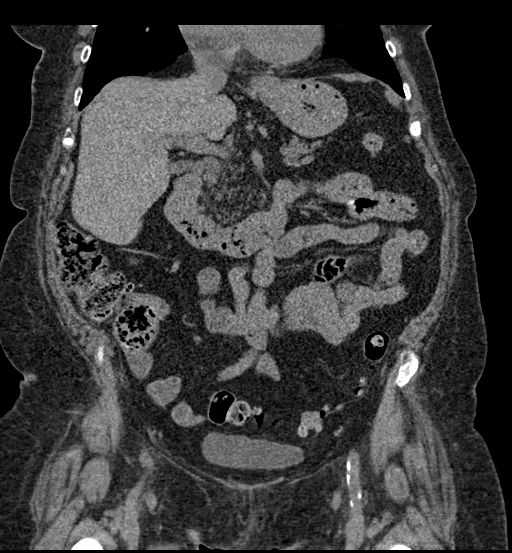
[im 78/140  soft-tissue]
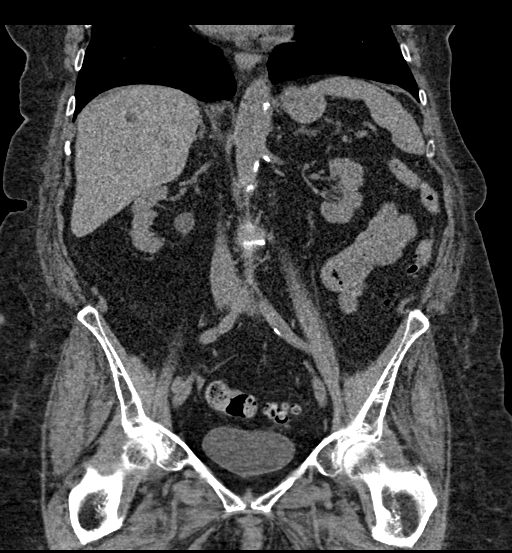

[16 of 46 positions shown; findings below may reference images not displayed]

FINDINGS: Lower chest: No acute abnormality.

Hepatobiliary: Status post cholecystectomy. No biliary dilatation is
noted. Stable right hepatic cyst.

Pancreas: Unremarkable. No pancreatic ductal dilatation or
surrounding inflammatory changes.

Spleen: Normal in size without focal abnormality.

Adrenals/Urinary Tract: Adrenal glands appear normal. Bilateral
renal cortical atrophy is noted. No hydronephrosis or renal
obstruction is noted. Urinary bladder is unremarkable. No renal or
ureteral calculi are noted.

Stomach/Bowel: The stomach appears normal. There is no evidence of
bowel obstruction or inflammation. Status post appendectomy. Sigmoid
diverticulosis is noted without inflammation.

Vascular/Lymphatic: Aortic atherosclerosis. No enlarged abdominal or
pelvic lymph nodes.

Reproductive: Status post hysterectomy. No adnexal masses.

Other: No abdominal wall hernia or abnormality. No abdominopelvic
ascites.

Musculoskeletal: There is interval development of superior endplate
depression of T12 vertebral body concerning for fracture of
indeterminate age.
IMPRESSION: Sigmoid diverticulosis without inflammation.

Interval development of mild superior endplate depression of T12
vertebral body concerning for fracture of indeterminate age. MRI may
be performed for further evaluation.

No other significant abnormality seen in the abdomen pelvis.

Aortic Atherosclerosis ([KJ]-[KJ]).

## 2021-04-28 MED ORDER — ONDANSETRON HCL 4 MG/2ML IJ SOLN
4.0000 mg | Freq: Once | INTRAMUSCULAR | Status: AC
  Administered 2021-04-28: 4 mg via INTRAVENOUS
  Filled 2021-04-28: qty 2

## 2021-04-28 MED ORDER — METOPROLOL TARTRATE 25 MG PO TABS
25.0000 mg | ORAL_TABLET | Freq: Once | ORAL | Status: AC
  Administered 2021-04-28: 25 mg via ORAL
  Filled 2021-04-28: qty 1

## 2021-04-28 MED ORDER — MAGNESIUM SULFATE 2 GM/50ML IV SOLN
2.0000 g | Freq: Once | INTRAVENOUS | Status: AC
  Administered 2021-04-28: 2 g via INTRAVENOUS
  Filled 2021-04-28: qty 50

## 2021-04-28 MED ORDER — PANTOPRAZOLE SODIUM 40 MG PO TBEC
40.0000 mg | DELAYED_RELEASE_TABLET | Freq: Once | ORAL | Status: AC
  Administered 2021-04-28: 40 mg via ORAL
  Filled 2021-04-28: qty 1

## 2021-04-28 MED ORDER — SODIUM CHLORIDE 0.9 % IV BOLUS
500.0000 mL | Freq: Once | INTRAVENOUS | Status: AC
  Administered 2021-04-28: 500 mL via INTRAVENOUS

## 2021-04-28 MED ORDER — FENTANYL CITRATE PF 50 MCG/ML IJ SOSY
25.0000 ug | PREFILLED_SYRINGE | INTRAMUSCULAR | Status: DC | PRN
  Administered 2021-04-28 – 2021-04-29 (×2): 25 ug via INTRAVENOUS
  Filled 2021-04-28 (×2): qty 1

## 2021-04-28 MED ORDER — APIXABAN 2.5 MG PO TABS
2.5000 mg | ORAL_TABLET | Freq: Once | ORAL | Status: AC
  Administered 2021-04-28: 2.5 mg via ORAL
  Filled 2021-04-28: qty 1

## 2021-04-28 MED ORDER — OXYCODONE HCL 5 MG PO TABS
5.0000 mg | ORAL_TABLET | Freq: Four times a day (QID) | ORAL | Status: DC | PRN
  Administered 2021-04-28 – 2021-04-30 (×6): 5 mg via ORAL
  Filled 2021-04-28 (×6): qty 1

## 2021-04-28 MED ORDER — FENTANYL CITRATE PF 50 MCG/ML IJ SOSY
50.0000 ug | PREFILLED_SYRINGE | INTRAMUSCULAR | Status: DC | PRN
  Administered 2021-04-28 (×2): 50 ug via INTRAVENOUS
  Filled 2021-04-28 (×2): qty 1

## 2021-04-28 MED ORDER — POTASSIUM CHLORIDE CRYS ER 20 MEQ PO TBCR
40.0000 meq | EXTENDED_RELEASE_TABLET | Freq: Once | ORAL | Status: AC
  Administered 2021-04-28: 40 meq via ORAL
  Filled 2021-04-28: qty 2

## 2021-04-28 MED ORDER — CALCIUM GLUCONATE-NACL 1-0.675 GM/50ML-% IV SOLN
1.0000 g | Freq: Once | INTRAVENOUS | Status: AC
  Administered 2021-04-28: 1000 mg via INTRAVENOUS
  Filled 2021-04-28: qty 50

## 2021-04-28 NOTE — Progress Notes (Signed)
° ° °  OVERNIGHT PROGRESS REPORT  Notified by Sherian Rein D for reorder of medications listed as intended in Attending's H&P.  Addressed:     "Hx of PTE     - continue eliquis"     "HTN     - continue home regimen as tolerated"    "GERD     - continue home regimen as tolerated"  Coordinated PM doses with Pharm.    Gershon Cull MSNA MSN ACNPC-AG Acute Care Nurse Practitioner [place]

## 2021-04-28 NOTE — H&P (Signed)
History and Physical    Patient: Christina Fry App TKZ:[id number] DOB: [birth year] DOA: 04/28/2021 DOS: the patient was seen and examined on 04/28/2021 PCP: Ferdie Ping, MD  Patient coming from: Home  Chief Complaint:  Chief Complaint  Patient presents with   Back Pain    HPI: Christina Fry is a 81 y.o. 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 Presenting with back pain. She was in her normal state of health until yesterday. She was lying on a couch and decided to get up. Once she stood up, she felt sharp pain across her back from the lower thoracic area down to her hip. It was constant. She tried tramadol, but it didn't help. She had a topical hemp cream that provided some relief. She didn't have any other symptoms. Her pain has remained constant ever since. The pain woke her out of sleep this morning. Since it did not resolve, she decided to come to the ED for help. She denies any other aggravating or alleviating factors.    Review of Systems: As mentioned in the history of present illness. All other systems reviewed and are negative. Past Medical History:  Diagnosis Date   Anxiety    Cancer ([place])    Diabetes mellitus without complication ([place])    Fibromyalgia    GERD (gastroesophageal reflux disease)    Grave's disease    Herpes    Hyperlipidemia    Mild intermittent asthma without complication 5/73/2202   Palpitation    Polymyalgia rheumatica ([place])    Past Surgical History:  Procedure Laterality Date   ABDOMINAL HYSTERECTOMY     APPENDECTOMY     CARPAL TUNNEL RELEASE     HERNIA REPAIR     INCISIONAL HERNIA REPAIR  03/20/2012   Procedure: LAPAROSCOPIC INCISIONAL HERNIA;  Surgeon: Jamesetta So, MD;  Location: [facility];  Service: General;  Laterality: N/A;   INSERTION OF MESH  03/20/2012   Procedure: INSERTION OF MESH;  Surgeon: Jamesetta So, MD;  Location: [facility];  Service: General;  Laterality: N/A;   TOTAL ABDOMINAL HYSTERECTOMY W/  BILATERAL SALPINGOOPHORECTOMY     Social History:  reports that she has never smoked. She has never used smokeless tobacco. She reports that she does not drink alcohol and does not use drugs.  Allergies  Allergen Reactions   Iohexol Shortness Of Breath    Pt stopped breathing at [place]. Pt refuses IV dye   Codeine Itching   Morphine And Related Itching   Nitrofuran Derivatives Other (See Comments)    Unknown   Oxycodone-Acetaminophen Itching    Family History  Problem Relation Age of Onset   Allergic rhinitis Neg Hx    Asthma Neg Hx    Eczema Neg Hx    Urticaria Neg Hx    Angioedema Neg Hx    Atopy Neg Hx    Immunodeficiency Neg Hx     Prior to Admission medications   Medication Sig Start Date End Date Taking? Authorizing Provider  acetaminophen (TYLENOL) 325 MG tablet Take 650 mg by mouth in the morning and at bedtime.     [provider]  acyclovir (ZOVIRAX) 200 MG capsule Take 1 capsule (200 mg total) by mouth 2 (two) times daily. 12/10/19   Shelly Coss, MD  albuterol (VENTOLIN HFA) 108 (90 Base) MCG/ACT inhaler Inhale 3 puffs into the lungs every 6 (six) hours as needed for wheezing or shortness of breath.    [provider]  apixaban (ELIQUIS) 5 MG TABS tablet Take 5 mg by mouth 2 (two) times daily.    [provider]  atorvastatin (LIPITOR) 20 MG tablet Take 20 mg by mouth at bedtime.    [provider]  budesonide (RHINOCORT AQUA) 32 MCG/ACT nasal spray one spray per nostril daily (aim for the ear on each side 06/29/20   Valentina Shaggy, MD  calcium-vitamin D (OSCAL WITH D) 500-200 MG-UNIT tablet Take 1 tablet by mouth daily with breakfast. 11/30/19   Dahal, Marlowe Aschoff, MD  carboxymethylcellulose 1 % ophthalmic solution Apply 1-4 drops to eye every 2 (two) hours while awake. 3-4 drops in right eye, 1-2 drops in left eye every 2 hours until bedtime.    [provider]  cholecalciferol (VITAMIN D3) 25 MCG (1000 UNIT) tablet  Take 1,000 Units by mouth daily.    [provider]  docusate sodium (COLACE) 100 MG capsule Take 1 capsule (100 mg total) by mouth daily. 08/28/20 08/28/21  Manuella Ghazi, Pratik D, DO  Docusate Sodium 100 MG capsule Take 100 mg by mouth 2 (two) times daily.    [provider]  EPINEPHrine 0.3 mg/0.3 mL IJ SOAJ injection Inject 0.3 mg into the muscle as needed for anaphylaxis. 04/12/21   Valentina Shaggy, MD  feeding supplement, GLUCERNA SHAKE, (GLUCERNA SHAKE) LIQD Take 237 mLs by mouth 3 (three) times daily between meals. 08/28/20   Manuella Ghazi, Pratik D, DO  ferrous sulfate 325 (65 FE) MG tablet Take 1 tablet (325 mg total) by mouth daily. 08/28/20 08/28/21  Manuella Ghazi, Pratik D, DO  fluticasone (FLONASE) 50 MCG/ACT nasal spray Place 1 spray into both nostrils 2 (two) times daily. 04/05/18   Valentina Shaggy, MD  gabapentin (NEURONTIN) 300 MG capsule Take 1 capsule (300 mg total) by mouth 3 (three) times daily. 12/10/19   Shelly Coss, MD  hydrOXYzine (ATARAX/VISTARIL) 10 MG tablet Take 1 tablet (10 mg total) by mouth 2 (two) times daily as needed for itching. 11/29/19   Terrilee Croak, MD  ipratropium (ATROVENT) 0.06 % nasal spray Place 2 sprays into both nostrils 3 (three) times daily. 06/18/20   Valentina Shaggy, MD  levothyroxine (SYNTHROID) 75 MCG tablet Take 75 mcg by mouth daily before breakfast. 30 minutes prior to meal or patient will not eat meal    [provider]  loratadine (CLARITIN) 10 MG tablet Take 1 tablet (10 mg total) by mouth daily as needed for allergies. 06/18/20 08/26/20  Valentina Shaggy, MD  metoprolol tartrate (LOPRESSOR) 25 MG tablet Take 1 tablet (25 mg total) by mouth 2 (two) times daily. 12/10/19   Shelly Coss, MD  nortriptyline (PAMELOR) 25 MG capsule Take 25 mg by mouth at bedtime.    [provider]  omeprazole (PRILOSEC) 20 MG capsule Take 20 mg by mouth 2 (two) times daily.    [provider]  polyethylene glycol (MIRALAX /  GLYCOLAX) 17 g packet Take 17 g by mouth daily.    [provider]  predniSONE (DELTASONE) 5 MG tablet Take 5 mg by mouth daily with breakfast.    [provider]  traMADol (ULTRAM) 50 MG tablet Take 50 mg by mouth every 6 (six) hours as needed for moderate pain.    [provider]    Physical Exam: Vitals:   04/28/21 1126 04/28/21 1245 04/28/21 1300 04/28/21 1415  BP: (!) 146/64 126/62 (!) 149/60 134/84  Pulse: 76 79 80 80  Resp: 18   18  Temp: 98.1 F (36.7  C)     TempSrc: Oral     SpO2: 100% 100% 100% 100%   General: 81 y.o. female resting in bed in NAD Eyes: PERRL, normal sclera ENMT: Nares patent w/o discharge, orophaynx clear, dentition normal, ears w/o discharge/lesions/ulcers Neck: Supple, trachea midline Cardiovascular: RRR, +S1, S2, no m/g/r, equal pulses throughout Respiratory: CTABL, no w/r/r, normal WOB GI: BS+, NDNT, no masses noted, no organomegaly noted MSK: No e/c/c; back pain w/ movement and TTP Neuro: A&O x 3, no focal deficits Psyc: Appropriate interaction and affect, calm/cooperative   Data Reviewed:  K+  3.1 Mg2+ 1.5 Ca2+ 7.3 (corrected) Glucose 67 Hgb 9.2  CT ab/pelvis: Interval development of mild superior endplate depression of W80 vertebral body concerning for fracture of indeterminate age. MRI may be performed for further evaluation.  Assessment and Plan: No notes have been filed under this hospital service. Service: Hospitalist T12 fracture Back pain Intractable pain d/t above     - places in obs, tele     - EDP spoke with neurosurgery; rec'd TSLO brace, WABT and PT/OT. Rec'd 2 week outpt follow up in [place] [facility]     - TSLO ordered     - PRN pain control     - PT/OT consult  Hypokalemia Hypocalcemia Hypomagnesemia     - replace K+, Mg2+, Ca2+     - trend labs     - check PTH, Vit D  Moderate protein calorie malnutrition Hypoglycemia     - dietician consult; check prealbumin  Hx of PTE     - continue  eliquis  HTN     - continue home regimen as tolerated  GERD     - continue home regimen as tolerated  Normocytic anemia     - at baseline, no evidence of bleed     - check iron studies  Advance Care Planning:   Code Status: DNR  Consults: EDP spoke with NSG  Family Communication: w/ daughter at bedside  Severity of Illness: The appropriate patient status for this patient is OBSERVATION. Observation status is judged to be reasonable and necessary in order to provide the required intensity of service to ensure the patient's safety. The patient's presenting symptoms, physical exam findings, and initial radiographic and laboratory data in the context of their medical condition is felt to place them at decreased risk for further clinical deterioration. Furthermore, it is anticipated that the patient will be medically stable for discharge from the hospital within 2 midnights of admission.   Author: Jonnie Finner, DO 04/28/2021 2:35 PM  For on call review [email or web address].

## 2021-04-28 NOTE — Progress Notes (Signed)
Orthopedic Tech Progress Note Patient Details:  Cheryln Balcom Scalf 12-08-1940 [id number]  Patient ID: [name], female   DOB: 1940-10-22, 81 y.o.   MRN: [id number]  Kennis Carina 04/28/2021, 3:41 PM TLSO ordered from [facility]

## 2021-04-28 NOTE — ED Triage Notes (Addendum)
Pt arrived via EMS, from independent living, c/o back pain that started last night after standing up from couch. No recent trauma.    20 L FA 100 mcg fentanyl  Placed on 2L [place] after fentanyl administration

## 2021-04-28 NOTE — ED Notes (Signed)
Rounded on pt. Pt currently denies any complaints and did not need any further assistance at this time.  ?

## 2021-04-28 NOTE — ED Provider Notes (Signed)
Emergency Department Provider Note   I have reviewed the triage vital signs and the nursing notes.   HISTORY  Chief Complaint Back Pain   HPI Christina Fry is a 81 y.o. female with past medical history reviewed below presents to the emergency department by EMS with severe back pain.  Symptoms began last night after she got up from the couch.  She is currently at an independent living facility.  She uses her motorized wheelchair at home but is able to take several steps and transfer between her chair and bed/toilet.  She denies any falls.  She is not having fevers or chills.  She completed a course of antibiotics for urinary tract infection last week.  Daughter, at bedside, states that she had been complaining of some return of dysuria and asked for some AZO which has not yet been given. No radiation of pain into the legs.  No numbness or weakness.  No bowel or bladder incontinence. No groin numbness.    Past Medical History:  Diagnosis Date   Anxiety    Cancer ([place])    Diabetes mellitus without complication ([place])    Fibromyalgia    GERD (gastroesophageal reflux disease)    Grave's disease    Herpes    Hyperlipidemia    Mild intermittent asthma without complication 6/59/9357   Palpitation    Polymyalgia rheumatica ([facility])     Review of Systems  Constitutional: No fever/chills Eyes: No visual changes. ENT: No sore throat. Cardiovascular: Denies chest pain. Respiratory: Denies shortness of breath. Gastrointestinal: No abdominal pain.  No nausea, no vomiting.  No diarrhea.  No constipation. Genitourinary: Negative for dysuria. Musculoskeletal: Positive for back pain. Skin: Negative for rash. Neurological: Negative for headaches, focal weakness or numbness.   ____________________________________________   PHYSICAL EXAM:  VITAL SIGNS: ED Triage Vitals [04/28/21 1126]  Enc Vitals Group     BP (!) 146/64     Pulse Rate 76     Resp 18     Temp 98.1 F (36.7 C)      Temp Source Oral     SpO2 100 %   Constitutional: Alert and oriented. Laying flat. Appears uncomfortable with movement.  Eyes: Conjunctivae are normal. Head: Atraumatic. Nose: No congestion/rhinnorhea. Mouth/Throat: Mucous membranes are moist.  Neck: No stridor.  No cervical spine tenderness to palpation. Cardiovascular: Normal rate, regular rhythm. Good peripheral circulation. Grossly normal heart sounds.   Respiratory: Normal respiratory effort.  No retractions. Lungs CTAB. Gastrointestinal: Soft but diffuse tenderness on exam. No rebound or guarding. No distention.  Musculoskeletal: No lower extremity tenderness nor edema. No gross deformities of extremities. Midline and paraspinal tenderness throughout the thoracic and lumbar spine.  Neurologic:  Normal speech and language. No gross focal neurologic deficits are appreciated.  Skin:  Skin is warm, dry and intact. No rash noted.  ____________________________________________   LABS (all labs ordered are listed, but only abnormal results are displayed)  Labs Reviewed  COMPREHENSIVE METABOLIC PANEL - Abnormal; Notable for the following components:      Result Value   Potassium 3.1 (*)    Chloride 115 (*)    CO2 21 (*)    Glucose, Bld 67 (*)    Calcium 5.9 (*)    Total Protein 4.2 (*)    Albumin 2.3 (*)    AST 11 (*)    Total Bilirubin 0.2 (*)    Anion gap 3 (*)    All other components within normal limits  CBC WITH DIFFERENTIAL/PLATELET -  Abnormal; Notable for the following components:   RBC 3.05 (*)    Hemoglobin 9.2 (*)    HCT 30.2 (*)    All other components within normal limits  URINALYSIS, ROUTINE W REFLEX MICROSCOPIC - Abnormal; Notable for the following components:   Color, Urine AMBER (*)    Nitrite POSITIVE (*)    All other components within normal limits  MAGNESIUM - Abnormal; Notable for the following components:   Magnesium 1.5 (*)    All other components within normal limits  URINE CULTURE  RESP PANEL BY  RT-PCR (FLU A&B, COVID) ARPGX2  LIPASE, BLOOD  RENAL FUNCTION PANEL   ____________________________________________  RADIOLOGY  CT ABDOMEN PELVIS WO CONTRAST  Result Date: 04/28/2021 CLINICAL DATA:  Acute back pain. EXAM: CT ABDOMEN AND PELVIS WITHOUT CONTRAST TECHNIQUE: Multidetector CT imaging of the abdomen and pelvis was performed following the standard protocol without IV contrast. RADIATION DOSE REDUCTION: This exam was performed according to the departmental dose-optimization program which includes automated exposure control, adjustment of the mA and/or kV according to patient size and/or use of iterative reconstruction technique. COMPARISON:  March 16, 2012. FINDINGS: Lower chest: No acute abnormality. Hepatobiliary: Status post cholecystectomy. No biliary dilatation is noted. Stable right hepatic cyst. Pancreas: Unremarkable. No pancreatic ductal dilatation or surrounding inflammatory changes. Spleen: Normal in size without focal abnormality. Adrenals/Urinary Tract: Adrenal glands appear normal. Bilateral renal cortical atrophy is noted. No hydronephrosis or renal obstruction is noted. Urinary bladder is unremarkable. No renal or ureteral calculi are noted. Stomach/Bowel: The stomach appears normal. There is no evidence of bowel obstruction or inflammation. Status post appendectomy. Sigmoid diverticulosis is noted without inflammation. Vascular/Lymphatic: Aortic atherosclerosis. No enlarged abdominal or pelvic lymph nodes. Reproductive: Status post hysterectomy. No adnexal masses. Other: No abdominal wall hernia or abnormality. No abdominopelvic ascites. Musculoskeletal: There is interval development of superior endplate depression of U98 vertebral body concerning for fracture of indeterminate age. IMPRESSION: Sigmoid diverticulosis without inflammation. Interval development of mild superior endplate depression of J19 vertebral body concerning for fracture of indeterminate age. MRI may be  performed for further evaluation. No other significant abnormality seen in the abdomen pelvis. Aortic Atherosclerosis (ICD10-I70.0). Electronically Signed   By: Marijo Conception M.D.   On: 04/28/2021 14:00   CT L-SPINE NO CHARGE  Result Date: 04/28/2021 CLINICAL DATA:  Back pain beginning last night after standing up from a couch. EXAM: CT LUMBAR SPINE WITHOUT CONTRAST TECHNIQUE: Multidetector CT imaging of the lumbar spine was performed without intravenous contrast administration. Multiplanar CT image reconstructions were also generated. RADIATION DOSE REDUCTION: This exam was performed according to the departmental dose-optimization program which includes automated exposure control, adjustment of the mA and/or kV according to patient size and/or use of iterative reconstruction technique. COMPARISON:  Lumbar spine CT 11/23/2019 FINDINGS: Segmentation: 5 lumbar type vertebrae. Alignment: Slight right convex curvature of the upper lumbar spine. No significant listhesis. Vertebrae: T12 superior endplate compression fracture with 15% vertebral body height loss, new from 2021 and age-indeterminate but potentially recent. No lumbar spine fracture. No suspicious osseous lesion. Diffuse osteopenia. Paraspinal and other soft tissues: No acute abnormality in the paraspinal soft tissues. Intra-abdominal and pelvic contents reported separately. Disc levels: Moderate disc space narrowing at L5-S1 with preserved disc space heights elsewhere. Mild-to-moderate lower lumbar facet arthrosis. No evidence of high-grade spinal canal or neural foraminal stenosis. IMPRESSION: 1. Mild T12 compression fracture, new from 2021 and of indeterminate acuity but potentially recent. MRI could be performed for further evaluation if clinically indicated.  2. No lumbar spine fracture. Electronically Signed   By: Logan Bores M.D.   On: 04/28/2021 14:16    ____________________________________________   PROCEDURES  Procedure(s) performed:    Procedures  None  ____________________________________________   INITIAL IMPRESSION / ASSESSMENT AND PLAN / ED COURSE  Pertinent labs & imaging results that were available during my care of the patient were reviewed by me and considered in my medical decision making (see chart for details).   This patient is Presenting for Evaluation of back pain, which does require a range of treatment options, and is a complaint that involves a high risk of morbidity and mortality.  The Differential Diagnoses includes but is not exclusive to musculoskeletal back pain, renal colic, urinary tract infection, pyelonephritis, intra-abdominal causes of back pain, aortic aneurysm or dissection, cauda equina syndrome, sciatica, lumbar disc disease, thoracic disc disease, etc.   Critical Interventions- IV pain medication   Medications  magnesium sulfate IVPB 2 g 50 mL (has no administration in time range)  oxyCODONE (Oxy IR/ROXICODONE) immediate release tablet 5 mg (has no administration in time range)  fentaNYL (SUBLIMAZE) injection 25 mcg (has no administration in time range)  ondansetron (ZOFRAN) injection 4 mg (4 mg Intravenous Given 04/28/21 1216)  sodium chloride 0.9 % bolus 500 mL (0 mLs Intravenous Stopped 04/28/21 1338)  calcium gluconate 1 g/ 50 mL sodium chloride IVPB (1,000 mg Intravenous New Bag/Given 04/28/21 1401)  potassium chloride SA (KLOR-CON M) CR tablet 40 mEq (40 mEq Oral Given 04/28/21 1407)    Reassessment after intervention:  patient only minimally improved.    I did obtain Additional Historical Information from daughter at bedside and EMS.  I decided to review pertinent External Data, and in summary no recent ED visits.   Clinical Laboratory Tests Ordered, included CMP. Ca coming back at 5.9. Albumin is slightly low as well. Corrected for this Ca at 6.9. Slight hypokalemia as well.   UA is nitrate positive. Negative leukocytes, WBC, and bacteria. Hold on abx in this setting and  will send for culture.   Radiologic Tests Ordered, included CT abdomen/pelvis. I independently interpreted the images and agree with radiology interpretation.   Cardiac Monitor Tracing which shows NSR.   Social Determinants of Health Risk non-smoker.   Consult complete with Hospitalist.   Discussed patient's case with [facility], Dr. Marylyn Ishihara to request admission. Patient and family (if present) updated with plan. Care transferred to [facility] service.  03:00 PM  Spoke with Dr. [name] with NSG. Agrees with TLSO and PT/OT. WBAT. Can follow with the patient 2 weeks from now in the office for repeat x-rays.   Medical Decision Making: Summary:  Patient presents to the emergency department for evaluation of back pain.  Patient has some abdominal tenderness.  Question of some return of urinary tract infection symptoms as well.  Patient has IV contrast dye allergy.  Plan for CT abdomen pelvis along with L-spine to evaluate for possible compression/wedge fracture. Plan for pain mgmt.   Reevaluation with update and discussion with patient and family at bedside. Patient continues to have severe pain. Continues to lay flat. Moderate to severe pain with any attempt to sit upright.   Disposition: admit  ____________________________________________  FINAL CLINICAL IMPRESSION(S) / ED DIAGNOSES  Final diagnoses:  Compression fracture of T12 vertebra, initial encounter ([place])  Hypocalcemia  Hypokalemia    Note:  This document was prepared using Dragon voice recognition software and may include unintentional dictation errors.  Nanda Quinton, MD, [facility]  Margette Fast, MD 04/28/21 [id number]

## 2021-04-29 ENCOUNTER — Other Ambulatory Visit: Payer: Self-pay

## 2021-04-29 DIAGNOSIS — E871 Hypo-osmolality and hyponatremia: Secondary | ICD-10-CM | POA: Diagnosis not present

## 2021-04-29 DIAGNOSIS — S22080A Wedge compression fracture of T11-T12 vertebra, initial encounter for closed fracture: Secondary | ICD-10-CM | POA: Diagnosis not present

## 2021-04-29 DIAGNOSIS — E538 Deficiency of other specified B group vitamins: Secondary | ICD-10-CM | POA: Diagnosis present

## 2021-04-29 DIAGNOSIS — M545 Low back pain, unspecified: Secondary | ICD-10-CM | POA: Diagnosis not present

## 2021-04-29 DIAGNOSIS — E039 Hypothyroidism, unspecified: Secondary | ICD-10-CM | POA: Diagnosis present

## 2021-04-29 DIAGNOSIS — Z90710 Acquired absence of both cervix and uterus: Secondary | ICD-10-CM | POA: Diagnosis not present

## 2021-04-29 DIAGNOSIS — K5900 Constipation, unspecified: Secondary | ICD-10-CM | POA: Diagnosis present

## 2021-04-29 DIAGNOSIS — Z6831 Body mass index (BMI) 31.0-31.9, adult: Secondary | ICD-10-CM | POA: Diagnosis not present

## 2021-04-29 DIAGNOSIS — H1031 Unspecified acute conjunctivitis, right eye: Secondary | ICD-10-CM | POA: Diagnosis present

## 2021-04-29 DIAGNOSIS — E11649 Type 2 diabetes mellitus with hypoglycemia without coma: Secondary | ICD-10-CM | POA: Diagnosis present

## 2021-04-29 DIAGNOSIS — E440 Moderate protein-calorie malnutrition: Secondary | ICD-10-CM | POA: Diagnosis present

## 2021-04-29 DIAGNOSIS — F419 Anxiety disorder, unspecified: Secondary | ICD-10-CM | POA: Diagnosis present

## 2021-04-29 DIAGNOSIS — D539 Nutritional anemia, unspecified: Secondary | ICD-10-CM | POA: Diagnosis present

## 2021-04-29 DIAGNOSIS — K219 Gastro-esophageal reflux disease without esophagitis: Secondary | ICD-10-CM | POA: Diagnosis present

## 2021-04-29 DIAGNOSIS — M4854XA Collapsed vertebra, not elsewhere classified, thoracic region, initial encounter for fracture: Secondary | ICD-10-CM | POA: Diagnosis present

## 2021-04-29 DIAGNOSIS — M47816 Spondylosis without myelopathy or radiculopathy, lumbar region: Secondary | ICD-10-CM | POA: Diagnosis present

## 2021-04-29 DIAGNOSIS — I10 Essential (primary) hypertension: Secondary | ICD-10-CM | POA: Diagnosis present

## 2021-04-29 DIAGNOSIS — Z66 Do not resuscitate: Secondary | ICD-10-CM | POA: Diagnosis present

## 2021-04-29 DIAGNOSIS — D509 Iron deficiency anemia, unspecified: Secondary | ICD-10-CM | POA: Diagnosis present

## 2021-04-29 DIAGNOSIS — Z20822 Contact with and (suspected) exposure to covid-19: Secondary | ICD-10-CM | POA: Diagnosis present

## 2021-04-29 DIAGNOSIS — E0500 Thyrotoxicosis with diffuse goiter without thyrotoxic crisis or storm: Secondary | ICD-10-CM | POA: Diagnosis present

## 2021-04-29 DIAGNOSIS — E785 Hyperlipidemia, unspecified: Secondary | ICD-10-CM | POA: Diagnosis present

## 2021-04-29 DIAGNOSIS — M797 Fibromyalgia: Secondary | ICD-10-CM | POA: Diagnosis present

## 2021-04-29 DIAGNOSIS — E876 Hypokalemia: Secondary | ICD-10-CM | POA: Diagnosis present

## 2021-04-29 LAB — IRON AND TIBC
Iron: 37 ug/dL (ref 28–170)
Saturation Ratios: 10 % — ABNORMAL LOW (ref 10.4–31.8)
TIBC: 380 ug/dL (ref 250–450)
UIBC: 343 ug/dL

## 2021-04-29 LAB — COMPREHENSIVE METABOLIC PANEL
ALT: 11 U/L (ref 0–44)
AST: 19 U/L (ref 15–41)
Albumin: 3.4 g/dL — ABNORMAL LOW (ref 3.5–5.0)
Alkaline Phosphatase: 77 U/L (ref 38–126)
Anion gap: 7 (ref 5–15)
BUN: 15 mg/dL (ref 8–23)
CO2: 24 mmol/L (ref 22–32)
Calcium: 8.5 mg/dL — ABNORMAL LOW (ref 8.9–10.3)
Chloride: 106 mmol/L (ref 98–111)
Creatinine, Ser: 1.06 mg/dL — ABNORMAL HIGH (ref 0.44–1.00)
GFR, Estimated: 52 mL/min — ABNORMAL LOW (ref >60)
Glucose, Bld: 100 mg/dL — ABNORMAL HIGH (ref 70–99)
Potassium: 4.9 mmol/L (ref 3.5–5.1)
Sodium: 137 mmol/L (ref 135–145)
Total Bilirubin: 0.5 mg/dL (ref 0.3–1.2)
Total Protein: 6.3 g/dL — ABNORMAL LOW (ref 6.5–8.1)

## 2021-04-29 LAB — VITAMIN D 25 HYDROXY (VIT D DEFICIENCY, FRACTURES): Vit D, 25-Hydroxy: 60.9 ng/mL (ref 30–100)

## 2021-04-29 LAB — CBC
HCT: 35 % — ABNORMAL LOW (ref 36.0–46.0)
Hemoglobin: 10.8 g/dL — ABNORMAL LOW (ref 12.0–15.0)
MCH: 30.6 pg (ref 26.0–34.0)
MCHC: 30.9 g/dL (ref 30.0–36.0)
MCV: 99.2 fL (ref 80.0–100.0)
Platelets: 263 10*3/uL (ref 150–400)
RBC: 3.53 MIL/uL — ABNORMAL LOW (ref 3.87–5.11)
RDW: 15.2 % (ref 11.5–15.5)
WBC: 11.3 10*3/uL — ABNORMAL HIGH (ref 4.0–10.5)
nRBC: 0 % (ref 0.0–0.2)

## 2021-04-29 LAB — URINE CULTURE: Culture: <10000 — AB

## 2021-04-29 LAB — PREALBUMIN: Prealbumin: 25.4 mg/dL (ref 18–38)

## 2021-04-29 MED ORDER — LEVOTHYROXINE SODIUM 88 MCG PO TABS
88.0000 ug | ORAL_TABLET | Freq: Every day | ORAL | Status: DC
  Administered 2021-04-30 – 2021-05-06 (×7): 88 ug via ORAL
  Filled 2021-04-29 (×7): qty 1

## 2021-04-29 MED ORDER — ACETAMINOPHEN 325 MG PO TABS
650.0000 mg | ORAL_TABLET | Freq: Four times a day (QID) | ORAL | Status: DC | PRN
  Administered 2021-04-29: 650 mg via ORAL
  Filled 2021-04-29 (×2): qty 2

## 2021-04-29 MED ORDER — APIXABAN 2.5 MG PO TABS
2.5000 mg | ORAL_TABLET | Freq: Two times a day (BID) | ORAL | Status: DC
  Administered 2021-04-29 – 2021-05-04 (×11): 2.5 mg via ORAL
  Filled 2021-04-29 (×11): qty 1

## 2021-04-29 MED ORDER — LIDOCAINE 5 % EX PTCH
1.0000 | MEDICATED_PATCH | CUTANEOUS | Status: DC
  Administered 2021-04-29 – 2021-05-11 (×8): 1 via TRANSDERMAL
  Filled 2021-04-29 (×14): qty 1

## 2021-04-29 MED ORDER — ATORVASTATIN CALCIUM 20 MG PO TABS
20.0000 mg | ORAL_TABLET | Freq: Every day | ORAL | Status: DC
  Administered 2021-04-29 – 2021-05-12 (×13): 20 mg via ORAL
  Filled 2021-04-29 (×11): qty 1
  Filled 2021-04-29: qty 2
  Filled 2021-04-29: qty 1

## 2021-04-29 MED ORDER — ACETAMINOPHEN 650 MG RE SUPP: 650.0000 mg | Freq: Four times a day (QID) | RECTAL | Status: DC | PRN

## 2021-04-29 MED ORDER — OXYCODONE-ACETAMINOPHEN 5-325 MG PO TABS
1.0000 | ORAL_TABLET | Freq: Four times a day (QID) | ORAL | Status: DC | PRN
  Administered 2021-04-29 – 2021-05-03 (×4): 1 via ORAL
  Filled 2021-04-29 (×4): qty 1

## 2021-04-29 MED ORDER — OYSTER SHELL CALCIUM/D3 500-5 MG-MCG PO TABS
1.0000 | ORAL_TABLET | Freq: Every day | ORAL | Status: DC
  Administered 2021-04-30 – 2021-05-12 (×12): 1 via ORAL
  Filled 2021-04-29 (×12): qty 1

## 2021-04-29 MED ORDER — GLUCERNA SHAKE PO LIQD
237.0000 mL | Freq: Three times a day (TID) | ORAL | Status: DC
  Administered 2021-04-29 – 2021-04-30 (×2): 237 mL via ORAL
  Filled 2021-04-29 (×5): qty 237

## 2021-04-29 MED ORDER — DOCUSATE SODIUM 100 MG PO CAPS
100.0000 mg | ORAL_CAPSULE | Freq: Every day | ORAL | Status: DC
  Administered 2021-04-29 – 2021-05-02 (×2): 100 mg via ORAL
  Filled 2021-04-29 (×5): qty 1

## 2021-04-29 MED ORDER — METOPROLOL SUCCINATE ER 50 MG PO TB24
50.0000 mg | ORAL_TABLET | Freq: Every day | ORAL | Status: DC
  Administered 2021-04-29 – 2021-05-12 (×13): 50 mg via ORAL
  Filled 2021-04-29 (×13): qty 1

## 2021-04-29 MED ORDER — FERROUS SULFATE 325 (65 FE) MG PO TABS
325.0000 mg | ORAL_TABLET | Freq: Every day | ORAL | Status: DC
  Administered 2021-04-29 – 2021-05-06 (×2): 325 mg via ORAL
  Filled 2021-04-29 (×8): qty 1

## 2021-04-29 MED ORDER — ACYCLOVIR 200 MG PO CAPS
200.0000 mg | ORAL_CAPSULE | Freq: Two times a day (BID) | ORAL | Status: DC
  Administered 2021-04-29 – 2021-05-12 (×26): 200 mg via ORAL
  Filled 2021-04-29 (×28): qty 1

## 2021-04-29 MED ORDER — FLUTICASONE PROPIONATE 50 MCG/ACT NA SUSP
1.0000 | Freq: Every day | NASAL | Status: DC
  Administered 2021-04-29 – 2021-05-12 (×12): 1 via NASAL
  Filled 2021-04-29 (×2): qty 16

## 2021-04-29 MED ORDER — ENSURE ENLIVE PO LIQD
237.0000 mL | Freq: Two times a day (BID) | ORAL | Status: DC
  Administered 2021-04-29 – 2021-05-12 (×15): 237 mL via ORAL
  Filled 2021-04-29: qty 237

## 2021-04-29 MED ORDER — METHOCARBAMOL 1000 MG/10ML IJ SOLN
500.0000 mg | Freq: Four times a day (QID) | INTRAVENOUS | Status: DC | PRN
  Administered 2021-04-29 – 2021-04-30 (×3): 500 mg via INTRAVENOUS
  Filled 2021-04-29: qty 5
  Filled 2021-04-29 (×3): qty 500

## 2021-04-29 MED ORDER — PANTOPRAZOLE SODIUM 40 MG PO TBEC
40.0000 mg | DELAYED_RELEASE_TABLET | Freq: Every day | ORAL | Status: DC
  Administered 2021-04-29 – 2021-05-12 (×13): 40 mg via ORAL
  Filled 2021-04-29 (×13): qty 1

## 2021-04-29 MED ORDER — MELATONIN 5 MG PO TABS
5.0000 mg | ORAL_TABLET | Freq: Every day | ORAL | Status: DC
  Administered 2021-04-29 – 2021-05-11 (×13): 5 mg via ORAL
  Filled 2021-04-29 (×13): qty 1

## 2021-04-29 MED ORDER — ONDANSETRON HCL 4 MG/2ML IJ SOLN
4.0000 mg | Freq: Once | INTRAMUSCULAR | Status: AC
  Administered 2021-04-29: 4 mg via INTRAVENOUS
  Filled 2021-04-29: qty 2

## 2021-04-29 NOTE — Evaluation (Addendum)
Occupational Therapy Evaluation Patient Details Name: Christina Fry MRN: [id number] DOB: November 05, 1940 Today's Date: 04/29/2021   History of Present Illness Patient is an 81 year old female presenting with back pain that started 2/15 after getting up off couch. Imaging reveal age indeterminant mild T12 compression fracture. 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 Can pivot transfer herself at baseline but does report history of falls "oh I can fall." Educated patient in log roll technique to minimize back strain during bed mobility however patient is unable to complete rolling to R or L due to back pain. Initiates using bed rails on gurney and bending knees when instructed but when OT attempts to assist patient reports too much pain and rolls onto back. Patient medicated prior to OT however patient states pain pill only lasted about 30 minutes. Likely patient will need further pain intervention/medications in order to participate with therapy services. Acute OT to follow.      Recommendations for follow up therapy are one component of a multi-disciplinary discharge planning process, led by the attending physician.  Recommendations may be updated based on patient status, additional functional criteria and insurance authorization.   Follow Up Recommendations  Skilled nursing-short term rehab (<3 hours/day)    Assistance Recommended at Discharge Frequent or constant Supervision/Assistance  Patient can return home with the following Two people to help with walking and/or transfers;Two people to help with bathing/dressing/bathroom;Assistance with cooking/housework;Direct supervision/assist for medications management;Help with stairs or ramp for entrance;Assist for transportation    Functional Status Assessment  Patient has had a recent decline in their functional status and  demonstrates the ability to make significant improvements in function in a reasonable and predictable amount of time.  Equipment Recommendations  None recommended by OT       Precautions / Restrictions Precautions Precautions: Back Required Braces or Orthoses: Spinal Brace Spinal Brace: Thoracolumbosacral orthotic Restrictions Weight Bearing Restrictions: No      Mobility Bed Mobility Overal bed mobility: Needs Assistance Bed Mobility: Rolling Rolling: Max assist, Total assist         General bed mobility comments: Educate patient in log roll technique and with increased time minimally able to bend knees and limited UE strength to pull on bed rails. refused to allow OT to assist with rolling 2* increased pain. would not attempt sitting up 2* pain    Transfers                   General transfer comment: unable      Balance Overall balance assessment: History of Falls                                         ADL either performed or assessed with clinical judgement   ADL Overall ADL's : Needs assistance/impaired Eating/Feeding: Bed level;Set up   Grooming: Moderate assistance;Bed level   Upper Body Bathing: Total assistance;Bed level   Lower Body Bathing: Total assistance;Bed level   Upper Body Dressing : Total assistance;Bed level   Lower Body Dressing: Total assistance;Bed level     Toilet Transfer Details (indicate cue type and reason): unable Toileting- Clothing Manipulation and Hygiene: Total assistance;Bed level       Functional mobility during ADLs: Total assistance General ADL Comments:  Patient is significantly limited by acute back pain impacting ability to participate in ADL tasks      Pertinent Vitals/Pain Pain Assessment Pain Assessment: Faces Faces Pain Scale: Hurts whole lot Pain Location: back Pain Descriptors / Indicators: Grimacing, Moaning Pain Intervention(s): Premedicated before session, Limited activity  within patient's tolerance     Hand Dominance Right   Extremity/Trunk Assessment Upper Extremity Assessment Upper Extremity Assessment: Generalized weakness;LUE deficits/detail LUE Deficits / Details: limited shoulder AROM flexion ~60 degrees due to hx RC tear   Lower Extremity Assessment Lower Extremity Assessment: Defer to PT evaluation       Communication Communication Communication: No difficulties   Cognition Arousal/Alertness: Awake/alert Behavior During Therapy: WFL for tasks assessed/performed Overall Cognitive Status: Within Functional Limits for tasks assessed                                                  Home Living Family/patient expects to be discharged to:: Private residence Living Arrangements: Alone Available Help at Discharge: Family;Available PRN/intermittently Type of Home: Independent living facility Home Access: Elevator     Home Layout: One level     Bathroom Shower/Tub: Occupational psychologist: Handicapped height     Home Equipment: Grab bars - toilet;Grab bars - tub/shower;Shower seat;Electric scooter;Cane - single Barista (2 wheels);Rollator (4 wheels)   Additional Comments: uses adjustable bed      Prior Functioning/Environment Prior Level of Function : Independent/Modified Independent;History of Falls (last six months)             Mobility Comments: uses a power wheelchair, can pivot independently ADLs Comments: mod I for BADLs, does not drive        OT Problem List: Pain;Decreased activity tolerance;Decreased strength;Decreased range of motion;Decreased safety awareness;Decreased knowledge of precautions;Decreased knowledge of use of DME or AE;Impaired balance (sitting and/or standing)      OT Treatment/Interventions: Self-care/ADL training;Therapeutic exercise;DME and/or AE instruction;Therapeutic activities;Patient/family education;Balance training    OT Goals(Current goals can  be found in the care plan section) Acute Rehab OT Goals Patient Stated Goal: Less pain OT Goal Formulation: With patient Time For Goal Achievement: 05/13/21 Potential to Achieve Goals: Good  OT Frequency: Min 2X/week       AM-PAC OT "6 Clicks" Daily Activity     Outcome Measure Help from another person eating meals?: A Little Help from another person taking care of personal grooming?: A Lot Help from another person toileting, which includes using toliet, bedpan, or urinal?: Total Help from another person bathing (including washing, rinsing, drying)?: Total Help from another person to put on and taking off regular upper body clothing?: Total Help from another person to put on and taking off regular lower body clothing?: Total 6 Click Score: 9   End of Session Nurse Communication: Other (comment) (limited by pain)  Activity Tolerance: Patient limited by pain Patient left: in bed;with call bell/phone within reach  OT Visit Diagnosis: Other abnormalities of gait and mobility (R26.89);Pain Pain - part of body:  (back)                Time: 6761-9509 OT Time Calculation (min): 18 min Charges:  OT General Charges $OT Visit: 1 Visit OT Evaluation $OT Eval Low Complexity: 1 Low  Delbert Phenix OT OT pager: [phone]  Rosemary Holms 04/29/2021, 9:54 AM

## 2021-04-29 NOTE — Evaluation (Signed)
Physical Therapy Evaluation Patient Details Name: Christina Fry MRN: [id number] DOB: 11-27-1940 Today's Date: 04/29/2021  History of Present Illness  Patient is an 81 year old female presenting with back pain that started 2/15 after getting up off couch. Imaging reveal age indeterminant mild T12 compression fracture. 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 female admitted with above HPI and diagnosis. Patient is currently limited by functional impairments below (see PT problem list). Patient resides at [facility] and is modified independent to stand and pivot to/from wheelchair for mobility at baseline. Patient reports she does not walk anymore and is a power wheelchair user for mobility. Patient will benefit from continued skilled PT interventions to address impairments and progress independence with mobility, currently she required Mod-Max assist for bed mobility and sit<>stand. Recommending ST rehab at SNF prior to return to ILF vs return to [facility] with HHPT and 24/7 assist initially if pt/family can arrange. Acute PT will follow and progress as able.        Recommendations for follow up therapy are one component of a multi-disciplinary discharge planning process, led by the attending physician.  Recommendations may be updated based on patient status, additional functional criteria and insurance authorization.  Follow Up Recommendations Skilled nursing-short term rehab (<3 hours/day)    Assistance Recommended at Discharge Frequent or constant Supervision/Assistance  Patient can return home with the following  Two people to help with walking and/or transfers;Two people to help with bathing/dressing/bathroom;Assistance with cooking/housework;Direct supervision/assist for medications management;Direct supervision/assist for financial management;Assist for transportation;Help with stairs or ramp for entrance    Equipment Recommendations  None recommended by PT  Recommendations for Other Services       Functional Status Assessment Patient has had a recent decline in their functional status and demonstrates the ability to make significant improvements in function in a reasonable and predictable amount of time.     Precautions / Restrictions Precautions Precautions: Back Required Braces or Orthoses: Spinal Brace Spinal Brace: Thoracolumbosacral orthotic Restrictions Weight Bearing Restrictions: No      Mobility  Bed Mobility Overal bed mobility: Needs Assistance Bed Mobility: Rolling Rolling: Max assist, Total assist         General bed mobility comments: Educate patient in log roll technique and with increased time minimally able to bend knees and limited UE strength to pull on bed rails. refused to allow OT to assist with rolling 2* increased pain. would not attempt sitting up 2* pain    Transfers Overall transfer level: Needs assistance Equipment used: 1 person hand held assist Transfers: Sit to/from Stand Sit to Stand: Mod assist, From elevated surface                Ambulation/Gait                  Stairs            Wheelchair Mobility    Modified Rankin (Stroke Patients Only)       Balance Overall balance assessment: History of Falls                                           Pertinent Vitals/Pain Pain Assessment Pain Assessment: Faces Faces Pain Scale: Hurts even more Pain Location: back Pain Descriptors / Indicators: Grimacing, Moaning Pain Intervention(s): Limited activity  within patient's tolerance, Monitored during session, Premedicated before session, Repositioned    Home Living Family/patient expects to be discharged to:: Private residence ([place]) Living Arrangements: Alone Available Help at Discharge: Family;Available PRN/intermittently Type of Home: Independent living facility Home Access: [place]: One  level Home Equipment: Grab bars - toilet;Grab bars - tub/shower;Shower seat;Electric scooter;Cane - single Barista (2 wheels);Rollator (4 wheels) Additional Comments: uses adjustable bed    Prior Function Prior Level of Function : Independent/Modified Independent;History of Falls (last six months)             Mobility Comments: uses a power wheelchair, can pivot independently ADLs Comments: mod I for BADLs, does not drive     Hand Dominance   Dominant Hand: Right    Extremity/Trunk Assessment   Upper Extremity Assessment Upper Extremity Assessment: Defer to OT evaluation LUE Deficits / Details: limited shoulder AROM flexion ~60 degrees due to hx RC tear    Lower Extremity Assessment Lower Extremity Assessment: Generalized weakness    Cervical / Trunk Assessment Cervical / Trunk Assessment: Other exceptions Cervical / Trunk Exceptions: T12 endplate fracture indeterminate age, TLSO  Communication   Communication: No difficulties  Cognition Arousal/Alertness: Awake/alert Behavior During Therapy: WFL for tasks assessed/performed Overall Cognitive Status: Within Functional Limits for tasks assessed                                          General Comments      Exercises     Assessment/Plan    PT Assessment Patient needs continued PT services  PT Problem List Decreased strength;Decreased range of motion;Decreased activity tolerance;Decreased balance;Decreased mobility;Decreased knowledge of use of DME;Decreased knowledge of precautions;Pain       PT Treatment Interventions DME instruction;Gait training;Stair training;Functional mobility training;Therapeutic activities;Therapeutic exercise;Balance training;Patient/family education;Wheelchair mobility training    PT Goals (Current goals can be found in the Care Plan section)  Acute Rehab PT Goals Patient Stated Goal: get back to ILF, loves it at [facility] PT Goal Formulation: With  patient Time For Goal Achievement: 05/13/21 Potential to Achieve Goals: Fair    Frequency Min 3X/week     Co-evaluation               AM-PAC PT "6 Clicks" Mobility  Outcome Measure Help needed turning from your back to your side while in a flat bed without using bedrails?: A Lot Help needed moving from lying on your back to sitting on the side of a flat bed without using bedrails?: A Lot Help needed moving to and from a bed to a chair (including a wheelchair)?: Total Help needed standing up from a chair using your arms (e.g., wheelchair or bedside chair)?: A Lot Help needed to walk in hospital room?: Total Help needed climbing 3-5 steps with a railing? : Total 6 Click Score: 9    End of Session Equipment Utilized During Treatment: Gait belt Activity Tolerance: Patient tolerated treatment well;Patient limited by pain Patient left: in bed;with call bell/phone within reach Nurse Communication: Mobility status PT Visit Diagnosis: Other abnormalities of gait and mobility (R26.89);Muscle weakness (generalized) (M62.81);Difficulty in walking, not elsewhere classified (R26.2);Pain Pain - part of body:  (back)    Time: 9381-0175 PT Time Calculation (min) (ACUTE ONLY): 26 min   Charges:   PT Evaluation $PT Eval Low Complexity: 1 Low PT Treatments $Therapeutic  Activity: 8-22 mins        Verner Mould, DPT Acute Rehabilitation Services Office [phone] Pager [phone]   Jacques Navy 04/29/2021, 1:55 PM

## 2021-04-29 NOTE — ED Notes (Signed)
Pt O2 stats low 80s. Pt O2 increased to 4L.

## 2021-04-29 NOTE — Progress Notes (Signed)
PROGRESS NOTE  Christina Fry  DOB: [birth year]  PCP: Ferdie Ping, MD YSA:[id number]  DOA: 04/28/2021  LOS: 0 days  Hospital Day: 2  Brief narrative: Christina Fry is a 81 y.o. female with PMH significant for DM2, HLD, Graves' disease, hypothyroidism, polymyalgia rheumatica, fibromyalgia, history of PE, GERD, anxiety who lives at an independent living facility, at baseline uses a motorized wheelchair at home but is able to take several steps and transferring chair and bed/toilet.  She completed a course of antibiotics for UTI last week but continued to have dysuria. On 2/14, while getting up from her couch, patient sustained a sudden onset of sharp back pain from the lower thoracic area to the hip.  It remained constant for 24 hours and did not respond to tramadol or topical cream.  She then presented to the ED on 2/15. In the ED, patient was hemodynamically stable. Labs with potassium low at 3.1, calcium low at 5.9, albumin 2.3, hemoglobin low at 9.2, glucose low at 67 Urinalysis with clear amber color urine positive nitrite. CT abdomen pelvis showed interval development of mild superior endplate depression of N23 vertebral body concerning for fracture of indeterminate age.  ED physician discussed the case with neurosurgeon Dr. [name].  He recommended TLSO, PT OT, WBAT and a follow-up in the office after 2 weeks. In the ED, she required fentanyl IV for pain control. Admitted to hospitalist service primarily for pain management.  Subjective: Patient was seen and examined this morning.  Pleasant elderly Caucasian female.  Lying on bed.  Not in distress at rest.  She found the TLSO brace uncomfortable Chart reviewed.  Blood pressure stable Labs this morning unremarkable  Principal Problem:   Back pain Active Problems:   Essential hypertension   Hypothyroidism   Hypokalemia   Hypocalcemia   Hypomagnesemia    Assessment and Plan: Acute T12 vertebral fracture Intractable  pain -Presented with sudden onset back pain, persistent for 24 hours, not responding to outpatient pain medicines -Imaging as above showing T12 vertebral body fracture -ED physician discussed with neurosurgeon.  Recommended TLSO brace, WBAT, PT OT -PT eval ordered. -I would avoid IV fentanyl.  Start scheduled Percocet, lidocaine patch, Robaxin.   -Outpatient follow-up with neurosurgery in 2 weeks -Pending vitamin D level.  Hypokalemia/hypomagnesemia -Potassium low at 3.1, magnesium low at 1.5.  Replacement given.  Recheck. Recent Labs  Lab 04/28/21 1130 04/28/21 1400 04/28/21 1800  K 3.1*  --  4.9  MG  --  1.5*  --   PHOS  --   --  3.7   Hypocalcemia -Initial calcium level was low at 5.9, corrected calcium level low as well.  Replacement given with IV calcium gluconate.  Improved level on recheck. Recent Labs  Lab 04/28/21 1130 04/28/21 1800  CALCIUM 5.9* 8.9   Hypoglycemia  -Glucose level was low at 67 on presentation.   -No history of diabetes.  A1c 6.2 from June 2022.  Moderate protein calorie malnutrition -dietician consult; check prealbumin   Hx of PE -continue eliquis   Essential hypertension -Home meds include Toprol 50 mg daily,  -Continue same.    GERD  -PPI   Chronic macrocytic anemia Vitamin B12 deficiency Iron deficiency -Continue supplement.  Repeat anemia panel Recent Labs    08/26/20 1623 08/26/20 1624 08/27/20 0638 08/27/20 1243 08/28/20 0645 04/28/21 1130 04/29/21 0111  HGB 9.6*  --  8.9* 9.4* 8.6* 9.2*  --   MCV 100.9*  --  102.7*  --  101.0*  99.0  --   VITAMINB12  --  168*  --   --   --   --   --   FOLATE  --  8.8  --   --   --   --   --   FERRITIN  --  9*  --   --   --   --   --   TIBC  --  364  --   --   --   --  380  IRON  --  25*  --   --   --   --  37  RETICCTPCT  --   --  1.5  --   --   --   --     Mobility: Pending PT eval Goals of care   Code Status: DNR    Nutritional status:  There is no height or weight on file to  calculate BMI.      Diet:  Diet Order             Diet Heart Room service appropriate? Yes; Fluid consistency: Thin  Diet effective now                   DVT prophylaxis:  apixaban (ELIQUIS) tablet 2.5 mg Start: 04/29/21 1000 apixaban (ELIQUIS) tablet 2.5 mg   Antimicrobials: None Fluid: None Consultants: None Family Communication: None at bedside  Status is: Inpatient  Continue in-hospital care because: Needs pain medicine, pending PT eval Level of care: Telemetry   Dispo: The patient is from: Independent living facility              Anticipated d/c is to: Pending PT eval.  Patient is strongly stating that she wants to go back to independent living facility with home health PT              Patient currently is not medically stable to d/c.   Difficult to place patient No     Infusions:   methocarbamol (ROBAXIN) IV      Scheduled Meds:  acyclovir  200 mg Oral BID   apixaban  2.5 mg Oral BID   atorvastatin  20 mg Oral Daily   [START ON 04/30/2021] calcium-vitamin D  1 tablet Oral Q breakfast   docusate sodium  100 mg Oral Daily   feeding supplement  237 mL Oral BID BM   feeding supplement (GLUCERNA SHAKE)  237 mL Oral TID BM   ferrous sulfate  325 mg Oral Daily   fluticasone  1 spray Each Nare Daily   [START ON 04/30/2021] levothyroxine  88 mcg Oral QAC breakfast   lidocaine  1 patch Transdermal Q24H   melatonin  5 mg Oral QHS   Metoprolol Succinate  50 mg Oral Daily   pantoprazole  40 mg Oral Daily    PRN meds: acetaminophen **OR** acetaminophen, methocarbamol (ROBAXIN) IV, oxyCODONE, oxyCODONE-acetaminophen   Antimicrobials: Anti-infectives (From admission, onward)    Start     Dose/Rate Route Frequency Ordered Stop   04/29/21 1000  acyclovir (ZOVIRAX) 200 MG capsule 200 mg        200 mg Oral 2 times daily 04/29/21 0959         Objective: Vitals:   04/29/21 1100 04/29/21 1115  BP: (!) 104/50 (!) 134/47  Pulse: (!) 173 87  Resp: (!) 25 13   Temp:    SpO2: (!) 79% (!) 71%    Intake/Output Summary (Last 24 hours) at 04/29/2021 1147 Last data filed  at 04/28/2021 1728 Gross per 24 hour  Intake 600 ml  Output --  Net 600 ml   There were no vitals filed for this visit. Weight change:  There is no height or weight on file to calculate BMI.   Physical Exam: General exam: Pleasant, elderly Caucasian female.  Not in physical distress at rest Skin: No rashes, lesions or ulcers. HEENT: Atraumatic, normocephalic, no obvious bleeding Lungs: Clear to auscultation bilaterally CVS: Regular rate and rhythm, no murmur GI/Abd soft, nontender, nondistended, bowel sound present CNS: Alert, awake, oriented x3 Psychiatry: Mood appropriate Extremities: Puffy bilateral lower extremity, no clear pitting edema.  No calf tenderness  Data Review: I have personally reviewed the laboratory data and studies available.  F/u labs ordered Unresulted Labs (From admission, onward)     Start     Ordered   04/30/21 0500  Ferritin  Tomorrow morning,   R        04/29/21 0957   04/30/21 0500  Vitamin B12  Tomorrow morning,   R        04/29/21 0957   04/30/21 0500  TSH  Tomorrow morning,   R        04/29/21 1000   04/29/21 0500  Comprehensive metabolic panel  Tomorrow morning,   R        04/29/21 0044   04/29/21 0500  CBC  Tomorrow morning,   R        04/29/21 0044   04/29/21 0045  Parathyroid hormone, intact (no Ca)  Once,   R        04/29/21 0044   04/29/21 0045  VITAMIN D 25 Hydroxy (Vit-D Deficiency, Fractures)  Once,   R        04/29/21 0044   04/28/21 1134  Urine Culture  ONCE - STAT,   STAT       Question:  Indication  Answer:  Dysuria   04/28/21 1133            Signed, Terrilee Croak, MD [facility] 04/29/2021

## 2021-04-30 DIAGNOSIS — M545 Low back pain, unspecified: Secondary | ICD-10-CM | POA: Diagnosis not present

## 2021-04-30 LAB — FERRITIN: Ferritin: 18 ng/mL (ref 11–307)

## 2021-04-30 LAB — PARATHYROID HORMONE, INTACT (NO CA): PTH: 29 pg/mL (ref 15–65)

## 2021-04-30 LAB — MAGNESIUM: Magnesium: 1.9 mg/dL (ref 1.7–2.4)

## 2021-04-30 LAB — VITAMIN B12: Vitamin B-12: 280 pg/mL (ref 180–914)

## 2021-04-30 LAB — TSH: TSH: 15.372 u[IU]/mL — ABNORMAL HIGH (ref 0.350–4.500)

## 2021-04-30 MED ORDER — POLYETHYLENE GLYCOL 3350 17 G PO PACK
17.0000 g | PACK | Freq: Every day | ORAL | Status: DC
  Administered 2021-04-30 – 2021-05-02 (×2): 17 g via ORAL
  Filled 2021-04-30 (×7): qty 1

## 2021-04-30 MED ORDER — OXYCODONE HCL 5 MG PO TABS
10.0000 mg | ORAL_TABLET | Freq: Four times a day (QID) | ORAL | Status: DC | PRN
  Administered 2021-04-30 – 2021-05-05 (×9): 10 mg via ORAL
  Filled 2021-04-30 (×9): qty 2

## 2021-04-30 MED ORDER — ADULT MULTIVITAMIN W/MINERALS CH
1.0000 | ORAL_TABLET | Freq: Every day | ORAL | Status: DC
  Administered 2021-05-02 – 2021-05-12 (×10): 1 via ORAL
  Filled 2021-04-30 (×12): qty 1

## 2021-04-30 NOTE — TOC Initial Note (Signed)
Transition of Care [facility]) - Initial/Assessment Note    Patient Details  Name: Christina Fry MRN: [id number] Date of Birth: July 09, 1940  Transition of Care [facility]) CM/SW Contact:    Lynnell Catalan, RN Phone Number: 04/30/2021, 2:26 PM  Clinical Narrative:                  Pt is from [place] living at [facility]. She is mostly wheelchair bound at baseline except for taking a couple of steps to the bathroom. Pt refused SNF placement at this time and asked that I speak with her daughter about dc planning.  Spoke with [name] for planning. [name] states that she has spoken with the PT department at [facility]) and they have said they could provide therapy services 5 days a week. [name] states that she feels comfortable with pt going back to her ILF. Contacted Regina at [employer] and will fax therapy orders. Daughter has brought pt wheelchair to hospital and will transport her home.       Activities of Daily Living Home Assistive Devices/Equipment: Wheelchair ADL Screening (condition at time of admission) Patient's cognitive ability adequate to safely complete daily activities?: Yes Is the patient deaf or have difficulty hearing?: No Does the patient have difficulty seeing, even when wearing glasses/contacts?: No Does the patient have difficulty concentrating, remembering, or making decisions?: No Patient able to express need for assistance with ADLs?: No Does the patient have difficulty dressing or bathing?: Yes Independently performs ADLs?: Yes (appropriate for developmental age) Does the patient have difficulty walking or climbing stairs?: Yes Weakness of Legs: Both Weakness of Arms/Hands: None         Admission diagnosis:  Hypocalcemia [E83.51] Hypokalemia [E87.6] Back pain [M54.9] Compression fracture of T12 vertebra, initial encounter ([place]) [S22.080A] Patient Active Problem List   Diagnosis Date Noted   Back pain 04/28/2021   Hypokalemia 04/28/2021    Hypocalcemia 04/28/2021   Hypomagnesemia 04/28/2021   Mild intermittent asthma, uncomplicated 45/62/5638   Near syncope 08/26/2020   Pulmonary embolism ([place]) 08/26/2020   Weakness of both lower extremities 11/29/2019   Generalized weakness 11/23/2019   PMR (polymyalgia rheumatica) ([place]) 11/23/2019   GERD (gastroesophageal reflux disease) 11/23/2019   Fibromyalgia 11/23/2019   Essential hypertension 11/23/2019   Hypothyroidism 11/23/2019   Ambulatory dysfunction 11/23/2019   Accident due to mechanical fall without injury 11/23/2019   TIA (transient ischemic attack) 11/23/2019   Seasonal and perennial allergic rhinitis 04/04/2018   Mild intermittent asthma without complication 93/73/4287   PCP:  Ferdie Ping, MD Pharmacy:   [place], [place] - [address] [place] [address] Phone: [phone] Fax: [phone]     Social Determinants of Health (SDOH) Interventions    Readmission Risk Interventions Readmission Risk Prevention Plan 08/28/2020 12/06/2019  Transportation Screening Complete Complete  HRI or Home Care Consult Complete -  Social Work Consult for [place] Planning/Counseling Complete Complete  Palliative Care Screening Not Applicable -  Medication Review [employer] photographer) Complete Complete  Some recent data might be hidden

## 2021-04-30 NOTE — Progress Notes (Signed)
PROGRESS NOTE    Christina Fry  VZD:[id number] DOB: 30-Jan-1941 DOA: 04/28/2021 PCP: Ferdie Ping, MD   Brief Narrative:  Christina Fry is a 81 y.o. female with PMH significant for DM2, HLD, Graves' disease, hypothyroidism, polymyalgia rheumatica, fibromyalgia, history of PE, GERD, anxiety who lives at an independent living facility, at baseline uses a motorized wheelchair at home but is able to take several steps and transferring chair and bed/toilet.  She completed a course of antibiotics for UTI last week but continued to have dysuria. On 2/14, while getting up from her couch, patient sustained a sudden onset of sharp back pain from the lower thoracic area to the hip.  It remained constant for 24 hours and did not respond to tramadol or topical cream.  She then presented to the ED on 2/15. In the ED, patient was hemodynamically stable. Labs with potassium low at 3.1, calcium low at 5.9, albumin 2.3, hemoglobin low at 9.2, glucose low at 67 Urinalysis with clear amber color urine positive nitrite. CT abdomen pelvis showed interval development of mild superior endplate depression of I95 vertebral body concerning for fracture of indeterminate age.  ED physician discussed the case with neurosurgeon Dr. [name].  He recommended TLSO, PT OT, WBAT and a follow-up in the office after 2 weeks. In the ED, she required fentanyl IV for pain control. Admitted to hospitalist service primarily for pain management.  Assessment & Plan:   Acute T12 vertebral fracture Intractable pain -Presented with sudden onset back pain, persistent for 24 hours, not responding to outpatient pain medicines -Imaging as above showing T12 vertebral body fracture -ED physician discussed with neurosurgeon.  Recommended TLSO brace, WBAT, PT OT -cont. Percocet, lidocaine patch, Robaxin.   -Outpatient follow-up with neurosurgery in 2 weeks -Normal vitamin D level -PT/OT recommended SNF however patient refused SNF at this  time. -Patient will be discharged to independent living facility with home health PT  Hypokalemia: -Replenished  Hypomagnesemia: Replenished  Hypocalcemia -Initial calcium level was low at 5.9, corrected calcium level low as well.  Replacement given with IV calcium gluconate.  Improved level on recheck.  Hypoglycemia  -Glucose level was low at 67 on presentation.   -No history of diabetes.  A1c 6.2 from June 2022.   Moderate protein calorie malnutrition -dietician consult-continue feeding supplements prealbumin level: WNL  Hx of PE -continue eliquis   Essential hypertension -Home meds include Toprol 50 mg daily,  -Continue same.     GERD  -Stable.  Continue PPI    Chronic macrocytic anemia Vitamin B12 deficiency Iron deficiency -Continue supplement.    Hypothyroidism: TSH elevated.  Tells me that her PCP recently changed levothyroxine dose to 88 mcg.  She is compliant with that dose.  Repeat TSH on follow-up visit with PCP in 6 to 8 weeks  Hyperlipidemia: Continue statin  DVT prophylaxis: Eliquis Code Status: DNR Family Communication:  None present at bedside.  Plan of care discussed with patient in length and she verbalized understanding and agreed with it. Disposition Plan: Home with home health PT  Consultants:  Neurosurgery  Procedures:  None  Antimicrobials:  None  Status is: Inpatient     Subjective: Patient seen and examined.  Continues to have severe lower back pain.  She takes MiraLAX once daily at home and wishes to be back on it.  PT recommended SNF however she refused to go to SNF.  Would like to go back to her independent living facility with home health physical therapy.  No fever or chills.  No acute events overnight.  Objective: Vitals:   04/30/21 0314 04/30/21 0644 04/30/21 1055 04/30/21 1309  BP: (!) 122/39 (!) 136/49  (!) 145/48  Pulse: 66 73  76  Resp: 18 17  18   Temp: 98.9 F (37.2 C) 98.7 F (37.1 C)  100 F (37.8 C)  TempSrc:  Oral Oral  Oral  SpO2: 93% 90%  93%  Weight:   79.8 kg   Height:   5\' 3"  (1.6 m)     Intake/Output Summary (Last 24 hours) at 04/30/2021 1355 Last data filed at 04/30/2021 1051 Gross per 24 hour  Intake 300 ml  Output 300 ml  Net 0 ml   Filed Weights   04/30/21 1055  Weight: 79.8 kg    Examination:  General exam: Appears calm and comfortable, on room air, communicating well Respiratory system: Clear to auscultation. Respiratory effort normal. Cardiovascular system: S1 & S2 heard, RRR. No JVD, murmurs, rubs, gallops or clicks. No pedal edema. Gastrointestinal system: Abdomen is nondistended, soft and nontender. No organomegaly or masses felt. Normal bowel sounds heard. Central nervous system: Alert and oriented.  Unable to to move bilateral lower extremities due to pain in her back.   Skin: No rashes, lesions or ulcers Psychiatry: Judgement and insight appear normal. Mood & affect appropriate.    Data Reviewed: I have personally reviewed following labs and imaging studies  CBC: Recent Labs  Lab 04/28/21 1130 04/29/21 1430  WBC 8.6 11.3*  NEUTROABS 5.8  --   HGB 9.2* 10.8*  HCT 30.2* 35.0*  MCV 99.0 99.2  PLT 189 275   Basic Metabolic Panel: Recent Labs  Lab 04/28/21 1130 04/28/21 1400 04/28/21 1800 04/29/21 0111 04/30/21 0523  NA 139  --  138 137  --   K 3.1*  --  4.9 4.9  --   CL 115*  --  106 106  --   CO2 21*  --  27 24  --   GLUCOSE 67*  --  143* 100*  --   BUN 15  --  17 15  --   CREATININE 0.92  --  1.03* 1.06*  --   CALCIUM 5.9*  --  8.9 8.5*  --   MG  --  1.5*  --   --  1.9  PHOS  --   --  3.7  --   --    GFR: Estimated Creatinine Clearance: 40.2 mL/min (A) (by C-G formula based on SCr of 1.06 mg/dL (H)). Liver Function Tests: Recent Labs  Lab 04/28/21 1130 04/28/21 1800 04/29/21 0111  AST 11*  --  19  ALT 8  --  11  ALKPHOS 52  --  77  BILITOT 0.2*  --  0.5  PROT 4.2*  --  6.3*  ALBUMIN 2.3* 3.6 3.4*   Recent Labs  Lab 04/28/21 1130   LIPASE 23   No results for input(s): AMMONIA in the last 168 hours. Coagulation Profile: No results for input(s): INR, PROTIME in the last 168 hours. Cardiac Enzymes: No results for input(s): CKTOTAL, CKMB, CKMBINDEX, TROPONINI in the last 168 hours. BNP (last 3 results) No results for input(s): PROBNP in the last 8760 hours. HbA1C: No results for input(s): HGBA1C in the last 72 hours. CBG: No results for input(s): GLUCAP in the last 168 hours. Lipid Profile: No results for input(s): CHOL, HDL, LDLCALC, TRIG, CHOLHDL, LDLDIRECT in the last 72 hours. Thyroid Function Tests: Recent Labs    04/30/21 [phone]  TSH 15.372*   Anemia Panel: Recent Labs    04/29/21 0111 04/30/21 0523  VITAMINB12  --  280  FERRITIN  --  18  TIBC 380  --   IRON 37  --    Sepsis Labs: No results for input(s): PROCALCITON, LATICACIDVEN in the last 168 hours.  Recent Results (from the past 240 hour(s))  Urine Culture     Status: Abnormal   Collection Time: 04/28/21 11:34 AM   Specimen: Urine, Clean Catch  Result Value Ref Range Status   Specimen Description   Final    URINE, CLEAN CATCH Performed at [facility], [place] [address], [place], [place] [address]    Special Requests   Final    NONE Performed at [facility], [place] [address], [place], [place] [address]    Culture (A)  Final    <10,000 COLONIES/mL INSIGNIFICANT GROWTH Performed at [place] [address] [place], [place] [address]    Report Status 04/29/2021 FINAL  Final  Resp Panel by RT-PCR (Flu A&B, Covid) Nasopharyngeal Swab     Status: None   Collection Time: 04/28/21  3:37 PM   Specimen: Nasopharyngeal Swab; Nasopharyngeal(NP) swabs in vial transport medium  Result Value Ref Range Status   SARS Coronavirus 2 by RT PCR NEGATIVE NEGATIVE Final    Comment: (NOTE) SARS-CoV-2 target nucleic acids are NOT DETECTED.  The SARS-CoV-2 RNA is generally detectable in upper  respiratory specimens during the acute phase of infection. The lowest concentration of SARS-CoV-2 viral copies this assay can detect is 138 copies/mL. A negative result does not preclude SARS-Cov-2 infection and should not be used as the sole basis for treatment or other patient management decisions. A negative result may occur with  improper specimen collection/handling, submission of specimen other than nasopharyngeal swab, presence of viral mutation(s) within the areas targeted by this assay, and inadequate number of viral copies(<138 copies/mL). A negative result must be combined with clinical observations, patient history, and epidemiological information. The expected result is Negative.  Fact Sheet for Patients:  EntrepreneurPulse.com.au  Fact Sheet for Healthcare Providers:  IncredibleEmployment.be  This test is no t yet approved or cleared by the [place] FDA and  has been authorized for detection and/or diagnosis of SARS-CoV-2 by FDA under an Emergency Use Authorization (EUA). This EUA will remain  in effect (meaning this test can be used) for the duration of the COVID-19 declaration under Section 564(b)(1) of the Act, 21 U.S.C.section 360bbb-3(b)(1), unless the authorization is terminated  or revoked sooner.       Influenza A by PCR NEGATIVE NEGATIVE Final   Influenza B by PCR NEGATIVE NEGATIVE Final    Comment: (NOTE) The Xpert Xpress SARS-CoV-2/FLU/RSV plus assay is intended as an aid in the diagnosis of influenza from Nasopharyngeal swab specimens and should not be used as a sole basis for treatment. Nasal washings and aspirates are unacceptable for Xpert Xpress SARS-CoV-2/FLU/RSV testing.  Fact Sheet for Patients: EntrepreneurPulse.com.au  Fact Sheet for Healthcare Providers: IncredibleEmployment.be  This test is not yet approved or cleared by the [place] FDA and has been  authorized for detection and/or diagnosis of SARS-CoV-2 by FDA under an Emergency Use Authorization (EUA). This EUA will remain in effect (meaning this test can be used) for the duration of the COVID-19 declaration under Section 564(b)(1) of the Act, 21 U.S.C. section 360bbb-3(b)(1), unless the authorization is terminated or revoked.  Performed at [facility], [address]  [place], [place] [address]       Radiology Studies: No results found.  Scheduled Meds:  acyclovir  200 mg Oral BID   apixaban  2.5 mg Oral BID   atorvastatin  20 mg Oral Daily   calcium-vitamin D  1 tablet Oral Q breakfast   docusate sodium  100 mg Oral Daily   feeding supplement  237 mL Oral BID BM   ferrous sulfate  325 mg Oral Q supper   fluticasone  1 spray Each Nare Daily   levothyroxine  88 mcg Oral QAC breakfast   lidocaine  1 patch Transdermal Q24H   melatonin  5 mg Oral QHS   metoprolol succinate  50 mg Oral Daily   multivitamin with minerals  1 tablet Oral Daily   pantoprazole  40 mg Oral Daily   polyethylene glycol  17 g Oral Daily   Continuous Infusions:  methocarbamol (ROBAXIN) IV 500 mg (04/29/21 1914)     LOS: 1 day   Time spent: 35 minutes   Sudeep Scheibel Loann Quill, MD [facility]  If 7PM-7AM, please contact night-coverage [email or web address] 04/30/2021, 1:55 PM

## 2021-04-30 NOTE — Progress Notes (Signed)
Initial Nutrition Assessment  DOCUMENTATION CODES:   Obesity unspecified  INTERVENTION:   -Ensure Plus High Protein po BID, each supplement provides 350 kcal and 20 grams of protein.   -D/c Glucerna shakes  -Add artificial sweeteners to allergy list  -Multivitamin with minerals daily  -Reviewed menu options with patient  NUTRITION DIAGNOSIS:   Increased nutrient needs related to acute illness as evidenced by estimated needs.  GOAL:   Patient will meet greater than or equal to 90% of their needs  MONITOR:   PO intake, Supplement acceptance, Labs, Weight trends, I & O's, Skin  REASON FOR ASSESSMENT:   Consult Assessment of nutrition requirement/status  ASSESSMENT:   81 y.o. female with PMH significant for DM2, HLD, Graves' disease, hypothyroidism, polymyalgia rheumatica, fibromyalgia, history of PE, GERD, anxiety who lives at an independent living facility, at baseline uses a motorized wheelchair at home but is able to take several steps and transferring chair and bed/toilet.  Patient in room, laying in bed.  Pt reports poor PO intakes for 2 days in the ED d/t food being cold. Pt is eager to eat something off the menu on the floor. States if she is not able to have cheerios on her diet she will not be happy. Showed pt the menu and reviewed her options. Pt seemed satisfied. States she is unable to swallow one of her pills, alerted this to RN.  Pt feels hungry but then states her stomach is not feeling good at this time.  Provided pt a cold, chocolate Ensure, prefers these over Glucerna. Pt states she does not add salt to her diet at home and she reports an allergy to most artificial sweeteners (did not see this in Epic).   Pt does not walk, uses power wheelchair at home (ILF). Pt reported a UBW of 167 lbs a few months ago following some weight gain. She then weighed herself and she was down to 155 lbs.  Current weight: 175 lbs.  Medications: OSCAL-D, Colace, Ferrous  sulfate, Miralax  Labs reviewed.  NUTRITION - FOCUSED PHYSICAL EXAM:  Flowsheet Row Most Recent Value  Orbital Region No depletion  Upper Arm Region No depletion  Thoracic and Lumbar Region No depletion  Buccal Region No depletion  Temple Region Mild depletion  Clavicle Bone Region No depletion  Clavicle and Acromion Bone Region No depletion  Scapular Bone Region No depletion  Dorsal Hand Moderate depletion  Patellar Region Unable to assess  [reports pain]  Anterior Thigh Region Unable to assess  Posterior Calf Region Unable to assess  Edema (RD Assessment) None  Hair Reviewed  Eyes Reviewed  Mouth Reviewed  Skin Reviewed       Diet Order:   Diet Order             Diet Heart Room service appropriate? Yes; Fluid consistency: Thin  Diet effective now                   EDUCATION NEEDS:   Education needs have been addressed  Skin:  Skin Assessment: Reviewed RN Assessment  Last BM:  2/15  Height:   Ht Readings from Last 1 Encounters:  04/30/21 5\' 3"  (1.6 m)    Weight:   Wt Readings from Last 1 Encounters:  04/30/21 79.8 kg    BMI:  Body mass index is 31.16 kg/m.  Estimated Nutritional Needs:   Kcal:  1500-1700  Protein:  65-75g  Fluid:  1.7L/day  Clayton Bibles, MS, RD, LDN Inpatient Clinical Dietitian Contact  information available via Amion

## 2021-05-01 DIAGNOSIS — M545 Low back pain, unspecified: Secondary | ICD-10-CM | POA: Diagnosis not present

## 2021-05-01 DIAGNOSIS — I10 Essential (primary) hypertension: Secondary | ICD-10-CM

## 2021-05-01 DIAGNOSIS — E876 Hypokalemia: Secondary | ICD-10-CM

## 2021-05-01 LAB — BASIC METABOLIC PANEL
Anion gap: 7 (ref 5–15)
BUN: 15 mg/dL (ref 8–23)
CO2: 29 mmol/L (ref 22–32)
Calcium: 9.4 mg/dL (ref 8.9–10.3)
Chloride: 99 mmol/L (ref 98–111)
Creatinine, Ser: 1.01 mg/dL — ABNORMAL HIGH (ref 0.44–1.00)
GFR, Estimated: 55 mL/min — ABNORMAL LOW (ref >60)
Glucose, Bld: 91 mg/dL (ref 70–99)
Potassium: 4.6 mmol/L (ref 3.5–5.1)
Sodium: 135 mmol/L (ref 135–145)

## 2021-05-01 LAB — CBC
HCT: 34.6 % — ABNORMAL LOW (ref 36.0–46.0)
Hemoglobin: 10.8 g/dL — ABNORMAL LOW (ref 12.0–15.0)
MCH: 30.5 pg (ref 26.0–34.0)
MCHC: 31.2 g/dL (ref 30.0–36.0)
MCV: 97.7 fL (ref 80.0–100.0)
Platelets: 238 10*3/uL (ref 150–400)
RBC: 3.54 MIL/uL — ABNORMAL LOW (ref 3.87–5.11)
RDW: 15.2 % (ref 11.5–15.5)
WBC: 10 10*3/uL (ref 4.0–10.5)
nRBC: 0 % (ref 0.0–0.2)

## 2021-05-01 MED ORDER — METHOCARBAMOL 500 MG PO TABS
500.0000 mg | ORAL_TABLET | Freq: Four times a day (QID) | ORAL | Status: DC | PRN
  Administered 2021-05-01 – 2021-05-03 (×6): 500 mg via ORAL
  Filled 2021-05-01 (×6): qty 1

## 2021-05-01 NOTE — Progress Notes (Signed)
PROGRESS NOTE    Christina Fry  TIR:[id number] DOB: [birth year] DOA: 04/28/2021 PCP: Ferdie Ping, MD   Brief Narrative:  Christina Fry is a 81 y.o. female with PMH significant for DM2, HLD, Graves' disease, hypothyroidism, polymyalgia rheumatica, fibromyalgia, history of PE, GERD, anxiety who lives at an independent living facility, at baseline uses a motorized wheelchair at home but is able to take several steps and transferring chair and bed/toilet.  She completed a course of antibiotics for UTI last week but continued to have dysuria. On 2/14, while getting up from her couch, patient sustained a sudden onset of sharp back pain from the lower thoracic area to the hip.  It remained constant for 24 hours and did not respond to tramadol or topical cream.  She then presented to the ED on 2/15. In the ED, patient was hemodynamically stable. Labs with potassium low at 3.1, calcium low at 5.9, albumin 2.3, hemoglobin low at 9.2, glucose low at 67 Urinalysis with clear amber color urine positive nitrite. CT abdomen pelvis showed interval development of mild superior endplate depression of Q76 vertebral body concerning for fracture of indeterminate age.  ED physician discussed the case with neurosurgeon Dr. [name].  He recommended TLSO, PT OT, WBAT and a follow-up in the office after 2 weeks. In the ED, she required fentanyl IV for pain control. Admitted to hospitalist service primarily for pain management.  Assessment & Plan:   Acute T12 vertebral fracture Intractable pain -Presented with sudden onset back pain, persistent for 24 hours, not responding to outpatient pain medicines -Imaging as above showing T12 vertebral body fracture -ED physician discussed with neurosurgeon.  Recommended TLSO brace, WBAT, PT OT -cont. Percocet, lidocaine patch, Robaxin.   -Outpatient follow-up with neurosurgery in 2 weeks -Normal vitamin D level -PT/OT recommended SNF-initially patient refused to go to SNF  however today she agreed. -I made TOC aware regarding patient's decision.  Hypokalemia: -Resolved  Hypomagnesemia: Replenished  Hypocalcemia -Initial calcium level was low at 5.9, corrected calcium level low as well.  Replacement given with IV calcium gluconate.  Resolved  Hypoglycemia  -Glucose level was low at 67 on presentation.   -No history of diabetes.  A1c 6.2 from June 2022.   Moderate protein calorie malnutrition -dietician consult-continue feeding supplements prealbumin level: WNL  Hx of PE -continue eliquis   Essential hypertension -Blood pressure is elevated likely due to pain. -Home meds include Toprol 50 mg daily,  -Continue same.     GERD  -Stable.  Continue PPI    Chronic macrocytic anemia Vitamin B12 deficiency Iron deficiency -Continue supplement.    Hypothyroidism: TSH elevated.  Tells me that her PCP recently changed levothyroxine dose to 88 mcg.  She is compliant with that dose.  Repeat TSH on follow-up visit with PCP in 6 to 8 weeks  Hyperlipidemia: Continue statin  DVT prophylaxis: Eliquis Code Status: DNR Family Communication:  None present at bedside.  Plan of care discussed with patient in length and she verbalized understanding and agreed with it.  I called patient's daughter and discussed plan of care and she verbalized understanding.  Disposition Plan: SNF  Consultants:  Neurosurgery  Procedures:  None  Antimicrobials:  None  Status is: Inpatient     Subjective: Patient seen and examined.  Lying comfortably on the bed.  Continues to have 10 out of 10 back pain.  No fever overnight, no acute events overnight.  She is not comfortable going home with that severe pain.  Objective:  Vitals:   04/30/21 1309 04/30/21 2053 05/01/21 0458 05/01/21 1226  BP: (!) 145/48 (!) 156/64 (!) 155/62 (!) 157/69  Pulse: 76 81 77 84  Resp: 18 16 16 19   Temp: 100 F (37.8 C) 99.1 F (37.3 C) 98.7 F (37.1 C) 98.9 F (37.2 C)  TempSrc: Oral  Oral Oral Oral  SpO2: 93% 96% 93% 94%  Weight:      Height:        Intake/Output Summary (Last 24 hours) at 05/01/2021 1227 Last data filed at 05/01/2021 0851 Gross per 24 hour  Intake 100 ml  Output 150 ml  Net -50 ml    Filed Weights   04/30/21 1055  Weight: 79.8 kg    Examination:  General exam: Appears calm and comfortable, on room air, communicating well Respiratory system: Clear to auscultation. Respiratory effort normal. Cardiovascular system: S1 & S2 heard, RRR. No JVD, murmurs, rubs, gallops or clicks. No pedal edema. Gastrointestinal system: Abdomen is nondistended, soft and nontender. No organomegaly or masses felt. Normal bowel sounds heard. Central nervous system: Alert and oriented.  Unable to to move bilateral lower extremities due to pain in her back.   Skin: No rashes, lesions or ulcers Psychiatry: Judgement and insight appear normal. Mood & affect appropriate.    Data Reviewed: I have personally reviewed following labs and imaging studies  CBC: Recent Labs  Lab 04/28/21 1130 04/29/21 1430 05/01/21 0804  WBC 8.6 11.3* 10.0  NEUTROABS 5.8  --   --   HGB 9.2* 10.8* 10.8*  HCT 30.2* 35.0* 34.6*  MCV 99.0 99.2 97.7  PLT 189 263 161    Basic Metabolic Panel: Recent Labs  Lab 04/28/21 1130 04/28/21 1400 04/28/21 1800 04/29/21 0111 04/30/21 0523 05/01/21 0804  NA 139  --  138 137  --  135  K 3.1*  --  4.9 4.9  --  4.6  CL 115*  --  106 106  --  99  CO2 21*  --  27 24  --  29  GLUCOSE 67*  --  143* 100*  --  91  BUN 15  --  17 15  --  15  CREATININE 0.92  --  1.03* 1.06*  --  1.01*  CALCIUM 5.9*  --  8.9 8.5*  --  9.4  MG  --  1.5*  --   --  1.9  --   PHOS  --   --  3.7  --   --   --     GFR: Estimated Creatinine Clearance: 42.2 mL/min (A) (by C-G formula based on SCr of 1.01 mg/dL (H)). Liver Function Tests: Recent Labs  Lab 04/28/21 1130 04/28/21 1800 04/29/21 0111  AST 11*  --  19  ALT 8  --  11  ALKPHOS 52  --  77  BILITOT 0.2*   --  0.5  PROT 4.2*  --  6.3*  ALBUMIN 2.3* 3.6 3.4*    Recent Labs  Lab 04/28/21 1130  LIPASE 23    No results for input(s): AMMONIA in the last 168 hours. Coagulation Profile: No results for input(s): INR, PROTIME in the last 168 hours. Cardiac Enzymes: No results for input(s): CKTOTAL, CKMB, CKMBINDEX, TROPONINI in the last 168 hours. BNP (last 3 results) No results for input(s): PROBNP in the last 8760 hours. HbA1C: No results for input(s): HGBA1C in the last 72 hours. CBG: No results for input(s): GLUCAP in the last 168 hours. Lipid Profile: No results for input(s): CHOL,  HDL, LDLCALC, TRIG, CHOLHDL, LDLDIRECT in the last 72 hours. Thyroid Function Tests: Recent Labs    04/30/21 0523  TSH 15.372*    Anemia Panel: Recent Labs    04/29/21 0111 04/30/21 0523  VITAMINB12  --  280  FERRITIN  --  18  TIBC 380  --   IRON 37  --     Sepsis Labs: No results for input(s): PROCALCITON, LATICACIDVEN in the last 168 hours.  Recent Results (from the past 240 hour(s))  Urine Culture     Status: Abnormal   Collection Time: 04/28/21 11:34 AM   Specimen: Urine, Clean Catch  Result Value Ref Range Status   Specimen Description   Final    URINE, CLEAN CATCH Performed at [facility], [place] [address] [place], [place] [address]    Special Requests   Final    NONE Performed at [facility], [place] [address] [place], [place] [address]    Culture (A)  Final    <10,000 COLONIES/mL INSIGNIFICANT GROWTH Performed at [place] [address], [place],  [address]    Report Status 04/29/2021 FINAL  Final  Resp Panel by RT-PCR (Flu A&B, Covid) Nasopharyngeal Swab     Status: None   Collection Time: 04/28/21  3:37 PM   Specimen: Nasopharyngeal Swab; Nasopharyngeal(NP) swabs in vial transport medium  Result Value Ref Range Status   SARS Coronavirus 2 by RT PCR NEGATIVE NEGATIVE Final    Comment: (NOTE) SARS-CoV-2 target  nucleic acids are NOT DETECTED.  The SARS-CoV-2 RNA is generally detectable in upper respiratory specimens during the acute phase of infection. The lowest concentration of SARS-CoV-2 viral copies this assay can detect is 138 copies/mL. A negative result does not preclude SARS-Cov-2 infection and should not be used as the sole basis for treatment or other patient management decisions. A negative result may occur with  improper specimen collection/handling, submission of specimen other than nasopharyngeal swab, presence of viral mutation(s) within the areas targeted by this assay, and inadequate number of viral copies(<138 copies/mL). A negative result must be combined with clinical observations, patient history, and epidemiological information. The expected result is Negative.  Fact Sheet for Patients:  EntrepreneurPulse.com.au  Fact Sheet for Healthcare Providers:  IncredibleEmployment.be  This test is no t yet approved or cleared by the [place] FDA and  has been authorized for detection and/or diagnosis of SARS-CoV-2 by FDA under an Emergency Use Authorization (EUA). This EUA will remain  in effect (meaning this test can be used) for the duration of the COVID-19 declaration under Section 564(b)(1) of the Act, 21 U.S.C.section 360bbb-3(b)(1), unless the authorization is terminated  or revoked sooner.       Influenza A by PCR NEGATIVE NEGATIVE Final   Influenza B by PCR NEGATIVE NEGATIVE Final    Comment: (NOTE) The Xpert Xpress SARS-CoV-2/FLU/RSV plus assay is intended as an aid in the diagnosis of influenza from Nasopharyngeal swab specimens and should not be used as a sole basis for treatment. Nasal washings and aspirates are unacceptable for Xpert Xpress SARS-CoV-2/FLU/RSV testing.  Fact Sheet for Patients: EntrepreneurPulse.com.au  Fact Sheet for Healthcare  Providers: IncredibleEmployment.be  This test is not yet approved or cleared by the [place] FDA and has been authorized for detection and/or diagnosis of SARS-CoV-2 by FDA under an Emergency Use Authorization (EUA). This EUA will remain in effect (meaning this test can be used) for the duration of the COVID-19 declaration under Section 564(b)(1) of the  Act, 21 U.S.C. section 360bbb-3(b)(1), unless the authorization is terminated or revoked.  Performed at [facility], [place] [address] [place], [place] [address]        Radiology Studies: No results found.  Scheduled Meds:  acyclovir  200 mg Oral BID   apixaban  2.5 mg Oral BID   atorvastatin  20 mg Oral Daily   calcium-vitamin D  1 tablet Oral Q breakfast   docusate sodium  100 mg Oral Daily   feeding supplement  237 mL Oral BID BM   ferrous sulfate  325 mg Oral Q supper   fluticasone  1 spray Each Nare Daily   levothyroxine  88 mcg Oral QAC breakfast   lidocaine  1 patch Transdermal Q24H   melatonin  5 mg Oral QHS   metoprolol succinate  50 mg Oral Daily   multivitamin with minerals  1 tablet Oral Daily   pantoprazole  40 mg Oral Daily   polyethylene glycol  17 g Oral Daily   Continuous Infusions:     LOS: 2 days   Time spent: 35 minutes   Khloe Hunkele Loann Quill, MD [facility]  If 7PM-7AM, please contact night-coverage [email or web address] 05/01/2021, 12:27 PM

## 2021-05-01 NOTE — TOC Progression Note (Signed)
Transition of Care [facility]) - Progression Note   Patient Details  Name: [name] MRN: [id number] Date of Birth: 1940/05/24  Transition [facility]) CM/SW [place], LCSW Phone Number: 05/01/2021, 2:07 PM  Clinical Narrative: Discharge plan was for the patient to return to [facility] with [facility] through [place]. CSW spoke with patient's daughter, [name], regarding patient's discharge. Per daughter, patient is now agreeing to SNF. CSW explained that as patient has been in a wheelchair for the past 1.5 years, patient is likely at baseline. Daughter is still interested in rehab. CSW explained patient is a [place] patient and will have to get approval from the [facility] for SNF and the [place] is not open on the weekend and Monday is a holiday. CSW explained that given patient's baseline, rehab will not likely change patient being in a wheelchair. CSW updated hospitalist.  Readmission Risk Interventions Readmission Risk Prevention Plan 08/28/2020 12/06/2019  Transportation Screening Complete Complete  HRI or Home Care Consult Complete -  Social Work Consult for [place] Planning/Counseling Complete Complete  Palliative Care Screening Not Applicable -  Medication Review Press photographer) Complete Complete  Some recent data might be hidden

## 2021-05-02 DIAGNOSIS — I10 Essential (primary) hypertension: Secondary | ICD-10-CM | POA: Diagnosis not present

## 2021-05-02 DIAGNOSIS — E876 Hypokalemia: Secondary | ICD-10-CM | POA: Diagnosis not present

## 2021-05-02 DIAGNOSIS — M545 Low back pain, unspecified: Secondary | ICD-10-CM | POA: Diagnosis not present

## 2021-05-02 MED ORDER — LORATADINE 10 MG PO TABS
10.0000 mg | ORAL_TABLET | Freq: Every day | ORAL | Status: DC
  Administered 2021-05-03 – 2021-05-12 (×9): 10 mg via ORAL
  Filled 2021-05-02 (×9): qty 1

## 2021-05-02 NOTE — Progress Notes (Signed)
PROGRESS NOTE    Christina Fry  GQQ:[id number] DOB: 1941/03/04 DOA: 04/28/2021 PCP: Ferdie Ping, MD   Brief Narrative:  Christina Fry is a 81 y.o. female with PMH significant for DM2, HLD, Graves' disease, hypothyroidism, polymyalgia rheumatica, fibromyalgia, history of PE, GERD, anxiety who lives at an independent living facility, at baseline uses a motorized wheelchair at home but is able to take several steps and transferring chair and bed/toilet.  She completed a course of antibiotics for UTI last week but continued to have dysuria. On 2/14, while getting up from her couch, patient sustained a sudden onset of sharp back pain from the lower thoracic area to the hip.  It remained constant for 24 hours and did not respond to tramadol or topical cream.  She then presented to the ED on 2/15. In the ED, patient was hemodynamically stable. Labs with potassium low at 3.1, calcium low at 5.9, albumin 2.3, hemoglobin low at 9.2, glucose low at 67 Urinalysis with clear amber color urine positive nitrite. CT abdomen pelvis showed interval development of mild superior endplate depression of I71 vertebral body concerning for fracture of indeterminate age.  ED physician discussed the case with neurosurgeon Dr. [name].  He recommended TLSO, PT OT, WBAT and a follow-up in the office after 2 weeks. In the ED, she required fentanyl IV for pain control. Admitted to hospitalist service primarily for pain management.  Assessment & Plan:   Acute T12 vertebral fracture Intractable pain -Presented with sudden onset back pain, persistent for 24 hours, not responding to outpatient pain medicines -Imaging as above showing T12 vertebral body fracture -ED physician discussed with neurosurgeon.  Recommended TLSO brace, WBAT, PT OT -cont. Percocet, lidocaine patch, Robaxin.   -Outpatient follow-up with neurosurgery in 2 weeks -Normal vitamin D level -PT/OT recommended SNF-initially patient refused to go to SNF  however then she agreed. -TOC consulted-[facility] and will need approval from [place] for SNF.  Hypokalemia: -Resolved  Hypomagnesemia: Replenished  Hypocalcemia -Initial calcium level was low at 5.9, corrected calcium level low as well.  Replacement given with IV calcium gluconate.  Resolved  Hypoglycemia  -Glucose level was low at 67 on presentation.   -No history of diabetes.  A1c 6.2  from June 2022.   Moderate protein calorie malnutrition -dietician consult-continue feeding supplements prealbumin level: WNL  Hx of PE -continue eliquis   Essential hypertension -Blood pressure is a stable this morning -Home meds include Toprol 50 mg daily,  -Continue same.     GERD  -Stable.  Continue PPI    Chronic macrocytic anemia Vitamin B12 deficiency Iron deficiency -Continue supplement.    Hypothyroidism: TSH elevated.  Tells me that her PCP recently changed levothyroxine dose to 88 mcg.  She is compliant with that dose.  Repeat TSH on follow-up visit with PCP in 6 to 8 weeks  Hyperlipidemia: Continue statin  DVT prophylaxis: Eliquis Code Status: DNR Family Communication:  None present at bedside.  Plan of care discussed with patient in length and she verbalized understanding and agreed with it.  I called patient's daughter on 2/18-2/19 and discussed plan of care and she verbalized understanding.  Disposition Plan: Pending SNF placement  Consultants:  Neurosurgery  Procedures:  None  Antimicrobials:  None  Status is: Inpatient     Subjective: Patient seen and examined.  Continues to have severe back pain.  No acute events overnight.  Objective: Vitals:   05/01/21 2458 05/01/21 1226 05/01/21 2159 05/02/21 0998  BP: (!) 155/62 (!) 157/69 138/61 (!) 138/49  Pulse: 77 84 86 75  Resp: 16 19 17 17   Temp: 98.7 F (37.1 C) 98.9 F (37.2 C) 99 F (37.2 C) 98.8 F (37.1 C)  TempSrc: Oral Oral Oral Oral  SpO2: 93% 94% 93% 95%  Weight:      Height:         Intake/Output Summary (Last 24 hours) at 05/02/2021 1138 Last data filed at 05/02/2021 3875 Gross per 24 hour  Intake 60 ml  Output 175 ml  Net -115 ml    Filed Weights   04/30/21 1055  Weight: 79.8 kg    Examination:  General exam: Appears calm and comfortable, on room air, communicating well Respiratory system: Clear to auscultation. Respiratory effort normal. Cardiovascular system: S1 & S2 heard, RRR. No JVD, murmurs, rubs, gallops or clicks. No pedal edema. Gastrointestinal system: Abdomen is nondistended, soft and nontender. No organomegaly or masses felt. Normal bowel sounds heard. Central nervous system: Alert and oriented.  Unable to to move bilateral lower extremities due to pain in her back.   Skin: No rashes, lesions or ulcers Psychiatry: Judgement and insight appear normal. Mood & affect appropriate.    Data Reviewed: I have personally reviewed following labs and imaging studies  CBC: Recent Labs  Lab 04/28/21 1130 04/29/21 1430 05/01/21 0804  WBC 8.6 11.3* 10.0  NEUTROABS 5.8  --   --   HGB 9.2* 10.8* 10.8*  HCT 30.2* 35.0* 34.6*  MCV 99.0 99.2 97.7  PLT 189 263 643    Basic Metabolic Panel: Recent Labs  Lab 04/28/21 1130 04/28/21 1400 04/28/21 1800 04/29/21 0111 04/30/21 0523 05/01/21 0804  NA 139  --  138 137  --  135  K 3.1*  --  4.9 4.9  --  4.6  CL 115*  --  106 106  --  99  CO2 21*  --  27 24  --  29  GLUCOSE 67*  --  143* 100*  --  91  BUN 15  --  17 15  --  15  CREATININE 0.92  --  1.03* 1.06*  --  1.01*  CALCIUM 5.9*  --  8.9 8.5*  --  9.4  MG  --  1.5*  --   --  1.9  --   PHOS  --   --  3.7  --   --   --     GFR: Estimated Creatinine Clearance: 42.2 mL/min (A) (by C-G formula based on SCr of 1.01 mg/dL (H)). Liver Function Tests: Recent Labs  Lab 04/28/21 1130 04/28/21 1800 04/29/21 0111  AST 11*  --  19  ALT 8  --  11  ALKPHOS 52  --  77  BILITOT 0.2*  --  0.5  PROT 4.2*  --  6.3*  ALBUMIN 2.3* 3.6 3.4*     Recent Labs  Lab 04/28/21 1130  LIPASE 23    No results for input(s): AMMONIA in the last 168 hours. Coagulation Profile: No results for input(s): INR, PROTIME in the last 168 hours. Cardiac Enzymes: No results for input(s): CKTOTAL, CKMB, CKMBINDEX, TROPONINI in the last 168 hours. BNP (last 3 results) No results for input(s): PROBNP in the last 8760 hours. HbA1C: No results for input(s): HGBA1C in the last 72 hours. CBG: No results for input(s): GLUCAP in the last 168 hours. Lipid Profile: No results for input(s): CHOL, HDL, LDLCALC, TRIG, CHOLHDL, LDLDIRECT in the last 72 hours. Thyroid Function Tests:  Recent Labs    04/30/21 0523  TSH 15.372*    Anemia Panel: Recent Labs    04/30/21 0523  VITAMINB12 280  FERRITIN 18    Sepsis Labs: No results for input(s): PROCALCITON, LATICACIDVEN in the last 168 hours.  Recent Results (from the past 240 hour(s))  Urine Culture     Status: Abnormal   Collection Time: 04/28/21 11:34 AM   Specimen: Urine, Clean Catch  Result Value Ref Range Status   Specimen Description   Final    URINE, CLEAN CATCH Performed at [facility], [place] [address] [place], [place] [address]    Special Requests   Final    NONE Performed at [facility], [place] [address], [place], [place] [address]    Culture (A)  Final    <10,000 COLONIES/mL INSIGNIFICANT GROWTH Performed at [place] [address], [place], [place] [address]    Report Status 04/29/2021 FINAL  Final  Resp Panel by RT-PCR (Flu A&B, Covid) Nasopharyngeal Swab     Status: None   Collection Time: 04/28/21  3:37 PM   Specimen: Nasopharyngeal Swab; Nasopharyngeal(NP) swabs in vial transport medium  Result Value Ref Range Status   SARS Coronavirus 2 by RT PCR NEGATIVE NEGATIVE Final    Comment: (NOTE) SARS-CoV-2 target nucleic acids are NOT DETECTED.  The SARS-CoV-2 RNA is generally detectable in upper respiratory specimens  during the acute phase of infection. The lowest concentration of SARS-CoV-2 viral copies this assay can detect is 138 copies/mL. A negative result does not preclude SARS-Cov-2 infection and should not be used as the sole basis for treatment or other patient management decisions. A negative result may occur with  improper specimen collection/handling, submission of specimen other than nasopharyngeal swab, presence of viral mutation(s) within the areas targeted by this assay, and inadequate number of viral copies(<138 copies/mL). A negative result must be combined with clinical observations, patient history, and epidemiological information. The expected result is Negative.  Fact Sheet for Patients:  EntrepreneurPulse.com.au  Fact Sheet for Healthcare Providers:  IncredibleEmployment.be  This test is no t yet approved or cleared by the [place] FDA and  has been authorized for detection and/or diagnosis of SARS-CoV-2 by FDA under an Emergency Use Authorization (EUA). This EUA will remain  in effect (meaning this test can be used) for the duration of the COVID-19 declaration under Section 564(b)(1) of the Act, 21 U.S.C.section 360bbb-3(b)(1), unless the authorization is terminated  or revoked sooner.       Influenza A by PCR NEGATIVE NEGATIVE Final   Influenza B by PCR NEGATIVE NEGATIVE Final    Comment: (NOTE) The Xpert Xpress SARS-CoV-2/FLU/RSV plus assay is intended as an aid in the diagnosis of influenza from Nasopharyngeal swab specimens and should not be used as a sole basis for treatment. Nasal washings and aspirates are unacceptable for Xpert Xpress SARS-CoV-2/FLU/RSV testing.  Fact Sheet for Patients: EntrepreneurPulse.com.au  Fact Sheet for Healthcare Providers: IncredibleEmployment.be  This test is not yet approved or cleared by the [place] FDA and has been authorized for detection  and/or diagnosis of SARS-CoV-2 by FDA under an Emergency Use Authorization (EUA). This EUA will remain in effect (meaning this test can be used) for the duration of the COVID-19 declaration under Section 564(b)(1) of the Act, 21 U.S.C. section 360bbb-3(b)(1), unless the authorization is terminated or revoked.  Performed at [facility], [place] [address] [place],  [address]  Radiology Studies: No results found.  Scheduled Meds:  acyclovir  200 mg Oral BID   apixaban  2.5 mg Oral BID   atorvastatin  20 mg Oral Daily   calcium-vitamin D  1 tablet Oral Q breakfast   docusate sodium  100 mg Oral Daily   feeding supplement  237 mL Oral BID BM   ferrous sulfate  325 mg Oral Q supper   fluticasone  1 spray Each Nare Daily   levothyroxine  88 mcg Oral QAC breakfast   lidocaine  1 patch Transdermal Q24H   loratadine  10 mg Oral Daily   melatonin  5 mg Oral QHS   metoprolol succinate  50 mg Oral Daily   multivitamin with minerals  1 tablet Oral Daily   pantoprazole  40 mg Oral Daily   polyethylene glycol  17 g Oral Daily   Continuous Infusions:     LOS: 3 days   Time spent: 35 minutes   Lilliauna Van Loann Quill, MD [facility]  If 7PM-7AM, please contact night-coverage [email or web address] 05/02/2021, 11:38 AM

## 2021-05-03 DIAGNOSIS — I10 Essential (primary) hypertension: Secondary | ICD-10-CM | POA: Diagnosis not present

## 2021-05-03 DIAGNOSIS — S22080A Wedge compression fracture of T11-T12 vertebra, initial encounter for closed fracture: Secondary | ICD-10-CM

## 2021-05-03 DIAGNOSIS — E876 Hypokalemia: Secondary | ICD-10-CM | POA: Diagnosis not present

## 2021-05-03 DIAGNOSIS — E039 Hypothyroidism, unspecified: Secondary | ICD-10-CM | POA: Diagnosis not present

## 2021-05-03 MED ORDER — GABAPENTIN 100 MG PO CAPS
200.0000 mg | ORAL_CAPSULE | Freq: Two times a day (BID) | ORAL | Status: DC
  Administered 2021-05-03 – 2021-05-12 (×18): 200 mg via ORAL
  Filled 2021-05-03 (×18): qty 2

## 2021-05-03 MED ORDER — OXYCODONE-ACETAMINOPHEN 5-325 MG PO TABS: 1.0000 | ORAL_TABLET | Freq: Four times a day (QID) | ORAL | Status: DC | PRN

## 2021-05-03 MED ORDER — ALBUTEROL SULFATE (2.5 MG/3ML) 0.083% IN NEBU: 3.0000 mL | INHALATION_SOLUTION | Freq: Four times a day (QID) | RESPIRATORY_TRACT | Status: DC | PRN

## 2021-05-03 MED ORDER — CALCIUM CARBONATE ANTACID 500 MG PO CHEW
500.0000 mg | CHEWABLE_TABLET | Freq: Two times a day (BID) | ORAL | Status: DC
  Administered 2021-05-03 – 2021-05-06 (×6): 500 mg via ORAL
  Filled 2021-05-03 (×6): qty 3

## 2021-05-03 MED ORDER — HYDROXYZINE HCL 10 MG PO TABS
10.0000 mg | ORAL_TABLET | Freq: Two times a day (BID) | ORAL | Status: DC | PRN
  Filled 2021-05-03: qty 1

## 2021-05-03 MED ORDER — VITAMIN D 25 MCG (1000 UNIT) PO TABS
1000.0000 [IU] | ORAL_TABLET | Freq: Every day | ORAL | Status: DC
  Administered 2021-05-03 – 2021-05-12 (×9): 1000 [IU] via ORAL
  Filled 2021-05-03 (×9): qty 1

## 2021-05-03 NOTE — TOC Progression Note (Signed)
Transition of Care [facility]) - Progression Note    Patient Details  Name: Christina Fry MRN: [id number] Date of Birth: March 10, 1941  Transition of [facility]) CM/SW Contact  Riggs Dineen, Marjie Skiff, RN Phone Number: 05/03/2021, 11:22 AM  Clinical Narrative:    Spoke with daughter Christina Fry again for dc planning. Christina Fry states that she wants pt to go to SNF prior to going back to [facility] so she can work on transferring again. Christina Fry states that pt was able to transfer to wheelchair and toilet on her own prior to admission. She is unable to do this now.  When looking through the chart it was noticed that pt has Medicare A in addition to the [place]. Spoke with Christina Fry about sending pt to SNF under her Medicare benefit. Christina Fry agrees to this. FL2 faxed out to area SNFs. TOC will follow for SNF bed offers.   Readmission Risk Interventions Readmission Risk Prevention Plan 08/28/2020 12/06/2019  Transportation Screening Complete Complete  HRI or Home Care Consult Complete -  Social Work Consult for [place] Planning/Counseling Complete Complete  Palliative Care Screening Not Applicable -  Medication Review Press photographer) Complete Complete  Some recent data might be hidden

## 2021-05-03 NOTE — Progress Notes (Addendum)
PT Cancellation Note  Patient Details Name: Christina Fry MRN: [id number] DOB: 01-Dec-1940   Cancelled Treatment:    Reason Eval/Treat Not Completed: Pain limiting ability to participate, patient declined, states the spasms are too great. Offered to   attempt sitting up. Patient stated" I am going to rehab, they can start therapy then.".  [place] Pager [phone] Office [phone]    Claretha Cooper 05/03/2021, 2:58 PM

## 2021-05-03 NOTE — NC FL2 (Signed)
[place] LEVEL OF CARE SCREENING TOOL     IDENTIFICATION  Patient Name: BLAKE GOYA Birthdate: 02-05-1941 Sex: female Admission Date (Current Location): 04/28/2021  [facility] and [place] Number:  Herbalist and Address:  [facility],  [place], [place]      Provider Number: [id number]  Attending Physician Name and Address:  Geradine Girt, DO  Relative Name and Phone Number:       Current Level of Care: Hospital Recommended Level of Care: [place] Prior Approval Number:    Date Approved/Denied:   PASRR Number: [id number]  Discharge Plan: SNF    Current Diagnoses: Patient Active Problem List   Diagnosis Date Noted   Back pain 04/28/2021   Hypokalemia 04/28/2021   Hypocalcemia 04/28/2021   Hypomagnesemia 04/28/2021   Mild intermittent asthma, uncomplicated 60/73/7106   Near syncope 08/26/2020   Pulmonary embolism ([place]) 08/26/2020   Weakness of both lower extremities 11/29/2019   Generalized weakness 11/23/2019   PMR (polymyalgia rheumatica) ([place]) 11/23/2019   GERD (gastroesophageal reflux disease) 11/23/2019   Fibromyalgia 11/23/2019   Essential hypertension 11/23/2019   Hypothyroidism 11/23/2019   Ambulatory dysfunction 11/23/2019   Accident due to mechanical fall without injury 11/23/2019   TIA (transient ischemic attack) 11/23/2019   Seasonal and perennial allergic rhinitis 04/04/2018   Mild intermittent asthma without complication 26/94/8546    Orientation RESPIRATION BLADDER Height & Weight     Self, Time, Situation, Place  Normal Continent Weight: 79.8 kg Height:  5\' 3"  (160 cm)  BEHAVIORAL SYMPTOMS/MOOD NEUROLOGICAL BOWEL NUTRITION STATUS      Continent Diet (Heart Healthy)  AMBULATORY STATUS COMMUNICATION OF NEEDS Skin   Extensive Assist Verbally Normal                       Personal Care Assistance Level of Assistance  Bathing, Dressing, Feeding Bathing Assistance:  Limited assistance Feeding assistance: Independent Dressing Assistance: Limited assistance     Functional Limitations Info  Sight, Hearing, Speech Sight Info: Impaired Hearing Info: Adequate Speech Info: Adequate    SPECIAL CARE FACTORS FREQUENCY  PT (By licensed PT), OT (By licensed OT)     PT Frequency: 5 x weekly OT Frequency: 5 x weekly            Contractures Contractures Info: Not present    Additional Factors Info  Code Status, Allergies Code Status Info: DNR Allergies Info: Iohexol, Codeine, Fentanyl, Morphine And Related, Nitrofuran Derivatives, Oxycodone-acetaminophen           Current Medications (05/03/2021):  This is the current hospital active medication list Current Facility-Administered Medications  Medication Dose Route Frequency Provider Last Rate Last Admin   acetaminophen (TYLENOL) tablet 650 mg  650 mg Oral Q6H PRN Marylyn Ishihara, Tyrone A, DO   650 mg at 04/29/21 2222   Or   acetaminophen (TYLENOL) suppository 650 mg  650 mg Rectal Q6H PRN Marylyn Ishihara, Tyrone A, DO       acyclovir (ZOVIRAX) 200 MG capsule 200 mg  200 mg Oral BID Dahal, Marlowe Aschoff, MD   200 mg at 05/03/21 1013   albuterol (PROVENTIL) (2.5 MG/3ML) 0.083% nebulizer solution 3 mL  3 mL Inhalation Q6H PRN Eulogio Bear U, DO       apixaban (ELIQUIS) tablet 2.5 mg  2.5 mg Oral BID Dahal, Binaya, MD   2.5 mg at 05/03/21 1012   atorvastatin (LIPITOR) tablet 20 mg  20 mg Oral Daily Dahal, Binaya,  MD   20 mg at 05/03/21 1012   calcium carbonate (TUMS - dosed in mg elemental calcium) chewable tablet 500 mg  500 mg Oral BID Vann, Jessica U, DO       calcium-vitamin D (OSCAL WITH D) 500-5 MG-MCG per tablet 1 tablet  1 tablet Oral Q breakfast Dahal, Marlowe Aschoff, MD   1 tablet at 05/03/21 0741   cholecalciferol (VITAMIN D3) tablet 1,000 Units  1,000 Units Oral Daily Vann, Jessica U, DO       docusate sodium (COLACE) capsule 100 mg  100 mg Oral Daily Dahal, Marlowe Aschoff, MD   100 mg at 05/02/21 0858   feeding supplement (ENSURE  ENLIVE / ENSURE PLUS) liquid 237 mL  237 mL Oral BID BM Dahal, Marlowe Aschoff, MD   237 mL at 05/03/21 1021   ferrous sulfate tablet 325 mg  325 mg Oral Q supper Dahal, Marlowe Aschoff, MD   325 mg at 04/29/21 1620   fluticasone (FLONASE) 50 MCG/ACT nasal spray 1 spray  1 spray Each Nare Daily Dahal, Marlowe Aschoff, MD   1 spray at 05/03/21 1014   gabapentin (NEURONTIN) capsule 200 mg  200 mg Oral BID Vann, Jessica U, DO       hydrOXYzine (ATARAX) tablet 10 mg  10 mg Oral BID PRN Vann, Jessica U, DO       levothyroxine (SYNTHROID) tablet 88 mcg  88 mcg Oral QAC breakfast Dahal, Marlowe Aschoff, MD   88 mcg at 05/03/21 0504   lidocaine (LIDODERM) 5 % 1 patch  1 patch Transdermal Q24H Dahal, Marlowe Aschoff, MD   1 patch at 05/03/21 1016   loratadine (CLARITIN) tablet 10 mg  10 mg Oral Daily Pahwani, Rinka R, MD   10 mg at 05/03/21 1013   melatonin tablet 5 mg  5 mg Oral QHS Dahal, Marlowe Aschoff, MD   5 mg at 05/02/21 2222   methocarbamol (ROBAXIN) tablet 500 mg  500 mg Oral Q6H PRN Pahwani, Rinka R, MD   500 mg at 05/03/21 0507   metoprolol succinate (TOPROL-XL) 24 hr tablet 50 mg  50 mg Oral Daily Dahal, Marlowe Aschoff, MD   50 mg at 05/03/21 1013   multivitamin with minerals tablet 1 tablet  1 tablet Oral Daily Pahwani, Rinka R, MD   1 tablet at 05/03/21 1011   oxyCODONE (Oxy IR/ROXICODONE) immediate release tablet 10 mg  10 mg Oral Q6H PRN Pahwani, Rinka R, MD   10 mg at 05/02/21 1801   oxyCODONE-acetaminophen (PERCOCET/ROXICET) 5-325 MG per tablet 1 tablet  1 tablet Oral Q6H PRN Vann, Jessica U, DO       pantoprazole (PROTONIX) EC tablet 40 mg  40 mg Oral Daily Dahal, Binaya, MD   40 mg at 05/03/21 1014   polyethylene glycol (MIRALAX / GLYCOLAX) packet 17 g  17 g Oral Daily Pahwani, Rinka R, MD   17 g at 05/02/21 7915     Discharge Medications: Please see discharge summary for a list of discharge medications.  Relevant Imaging Results:  Relevant Lab Results:   Additional Information SSN#: [id number]  Jamise Pentland, Marjie Skiff, RN

## 2021-05-03 NOTE — Progress Notes (Signed)
PROGRESS NOTE    Christina Fry  POE:[id number] DOB: 1940/08/29 DOA: 04/28/2021 PCP: Ferdie Ping, MD   Brief Narrative:  Christina Fry is a 81 y.o. female with PMH significant for DM2, HLD, Graves' disease, hypothyroidism, polymyalgia rheumatica, fibromyalgia, history of PE, GERD, anxiety who lives at an independent living facility, at baseline uses a motorized wheelchair at home but is able to take several steps and transferring chair and bed/toilet.   On 2/14, while getting up from her couch, patient sustained a sudden onset of sharp back pain from the lower thoracic area to the hip.  It remained constant for 24 hours and did not respond to tramadol or topical cream.  She then presented to the ED on 2/15. CT abdomen pelvis showed interval development of mild superior endplate depression of R15 vertebral body concerning for fracture of indeterminate age.  ED physician discussed the case with neurosurgeon Dr. [name].  He recommended TLSO, PT OT, WBAT and a follow-up in the office after 2 weeks. Currently awaiting SNF placement.   Assessment & Plan: Acute T12 vertebral fracture Intractable pain -Presented with sudden onset back pain, persistent for 24 hours, not responding to outpatient pain medicines -Imaging as above showing T12 vertebral body fracture -ED physician discussed with neurosurgeon.  Recommended TLSO brace, WBAT, PT OT -cont. PRN oxy and Percocet, lidocaine patch, Robaxin.   -Outpatient follow-up with neurosurgery in 2 weeks -Normal vitamin D level -PT/OT recommended SNF -TOC consulted-[facility] and will need approval from [place] for SNF.  Hypokalemia: -Resolved  Hypomagnesemia:  -repleted  Hypocalcemia -Initial calcium level was low at 5.9, corrected calcium level low as well.  Replacement given with IV calcium gluconate.  Resolved  Hypoglycemia  -Glucose level was low at 67 on presentation.   -No history of diabetes.  A1c 6.2  from June 2022.    Moderate protein calorie malnutrition -dietician consult-continue feeding supplements Nutrition Status: Nutrition Problem: Increased nutrient needs Etiology: acute illness Signs/Symptoms: estimated needs Interventions: Ensure Enlive (each supplement provides 350kcal and 20 grams of protein), MVI   Hx of PE -continue eliquis   Essential hypertension -Blood pressure is a stable -Home meds include Toprol 50 mg daily,  -Continue same.     GERD  -Stable.  Continue PPI    Chronic macrocytic anemia Vitamin B12 deficiency Iron deficiency -Continue supplement.    Hypothyroidism: TSH elevated.   -PCP recently changed levothyroxine dose to 88 mcg.  She is compliant with that dose.  Repeat TSH on follow-up visit with PCP in 6 to 8 weeks  Hyperlipidemia: Continue statin  DVT prophylaxis: Eliquis Code Status: DNR  Disposition Plan: Pending SNF placement- goes through the [place]  Consultants:  Neurosurgery   Status is: Inpatient     Subjective: Still c/o back pain- somewhat improved with pain meds  Objective: Vitals:   05/02/21 1305 05/02/21 2135 05/03/21 0455 05/03/21 1011  BP: (!) 149/66 (!) 145/67 135/83 (!) 145/62  Pulse: 80 86 94 91  Resp: 15 18 17    Temp: 99.2 F (37.3 C) 99.3 F (37.4 C) 98.8 F (37.1 C)   TempSrc: Oral Oral Oral   SpO2: 91% 92% 93%   Weight:      Height:        Intake/Output Summary (Last 24 hours) at 05/03/2021 1039 Last data filed at 05/03/2021 0500 Gross per 24 hour  Intake 180 ml  Output 1150 ml  Net -970 ml   Filed Weights   04/30/21 1055  Weight: 79.8 kg    Examination:   General: Appearance:    Obese female in no acute distress, laying flat in bed     Lungs:     respirations unlabored  Heart:    Normal heart rate.    MS:   All extremities are intact.    Neurologic:   Awake, alert      Data Reviewed: I have personally reviewed following labs and imaging studies  CBC: Recent Labs  Lab 04/28/21 1130 04/29/21 1430  05/01/21 0804  WBC 8.6 11.3* 10.0  NEUTROABS 5.8  --   --   HGB 9.2* 10.8* 10.8*  HCT 30.2* 35.0* 34.6*  MCV 99.0 99.2 97.7  PLT 189 263 299   Basic Metabolic Panel: Recent Labs  Lab 04/28/21 1130 04/28/21 1400 04/28/21 1800 04/29/21 0111 04/30/21 0523 05/01/21 0804  NA 139  --  138 137  --  135  K 3.1*  --  4.9 4.9  --  4.6  CL 115*  --  106 106  --  99  CO2 21*  --  27 24  --  29  GLUCOSE 67*  --  143* 100*  --  91  BUN 15  --  17 15  --  15  CREATININE 0.92  --  1.03* 1.06*  --  1.01*  CALCIUM 5.9*  --  8.9 8.5*  --  9.4  MG  --  1.5*  --   --  1.9  --   PHOS  --   --  3.7  --   --   --    GFR: Estimated Creatinine Clearance: 42.2 mL/min (A) (by C-G formula based on SCr of 1.01 mg/dL (H)). Liver Function Tests: Recent Labs  Lab 04/28/21 1130 04/28/21 1800 04/29/21 0111  AST 11*  --  19  ALT 8  --  11  ALKPHOS 52  --  77  BILITOT 0.2*  --  0.5  PROT 4.2*  --  6.3*  ALBUMIN 2.3* 3.6 3.4*   Recent Labs  Lab 04/28/21 1130  LIPASE 23   No results for input(s): AMMONIA in the last 168 hours. Coagulation Profile: No results for input(s): INR, PROTIME in the last 168 hours. Cardiac Enzymes: No results for input(s): CKTOTAL, CKMB, CKMBINDEX, TROPONINI in the last 168 hours. BNP (last 3 results) No results for input(s): PROBNP in the last 8760 hours. HbA1C: No results for input(s): HGBA1C in the last 72 hours. CBG: No results for input(s): GLUCAP in the last 168 hours. Lipid Profile: No results for input(s): CHOL, HDL, LDLCALC, TRIG, CHOLHDL, LDLDIRECT in the last 72 hours. Thyroid Function Tests: No results for input(s): TSH, T4TOTAL, FREET4, T3FREE, THYROIDAB in the last 72 hours. Anemia Panel: No results for input(s): VITAMINB12, FOLATE, FERRITIN, TIBC, IRON, RETICCTPCT in the last 72 hours. Sepsis Labs: No results for input(s): PROCALCITON, LATICACIDVEN in the last 168 hours.  Recent Results (from the past 240 hour(s))  Urine Culture     Status:  Abnormal   Collection Time: 04/28/21 11:34 AM   Specimen: Urine, Clean Catch  Result Value Ref Range Status   Specimen Description   Final    URINE, CLEAN CATCH Performed at [facility], [place] [address] [place], [place] [address]    Special Requests   Final    NONE Performed at [facility], [place] [address], [place], [place] [address]    Culture (A)  Final    <10,000 COLONIES/mL INSIGNIFICANT GROWTH  Performed at [facility], [place] [address] [place], [place] [address]    Report Status 04/29/2021 FINAL  Final  Resp Panel by RT-PCR (Flu A&B, Covid) Nasopharyngeal Swab     Status: None   Collection Time: 04/28/21  3:37 PM   Specimen: Nasopharyngeal Swab; Nasopharyngeal(NP) swabs in vial transport medium  Result Value Ref Range Status   SARS Coronavirus 2 by RT PCR NEGATIVE NEGATIVE Final    Comment: (NOTE) SARS-CoV-2 target nucleic acids are NOT DETECTED.  The SARS-CoV-2 RNA is generally detectable in upper respiratory specimens during the acute phase of infection. The lowest concentration of SARS-CoV-2 viral copies this assay can detect is 138 copies/mL. A negative result does not preclude SARS-Cov-2 infection and should not be used as the sole basis for treatment or other patient management decisions. A negative result may occur with  improper specimen collection/handling, submission of specimen other than nasopharyngeal swab, presence of viral mutation(s) within the areas targeted by this assay, and inadequate number of viral copies(<138 copies/mL). A negative result must be combined with clinical observations, patient history, and epidemiological information. The expected result is Negative.  Fact Sheet for Patients:  EntrepreneurPulse.com.au  Fact Sheet for Healthcare Providers:  IncredibleEmployment.be  This test is no t yet approved or cleared by the [place] FDA and  has been  authorized for detection and/or diagnosis of SARS-CoV-2 by FDA under an Emergency Use Authorization (EUA). This EUA will remain  in effect (meaning this test can be used) for the duration of the COVID-19 declaration under Section 564(b)(1) of the Act, 21 U.S.C.section 360bbb-3(b)(1), unless the authorization is terminated  or revoked sooner.       Influenza A by PCR NEGATIVE NEGATIVE Final   Influenza B by PCR NEGATIVE NEGATIVE Final    Comment: (NOTE) The Xpert Xpress SARS-CoV-2/FLU/RSV plus assay is intended as an aid in the diagnosis of influenza from Nasopharyngeal swab specimens and should not be used as a sole basis for treatment. Nasal washings and aspirates are unacceptable for Xpert Xpress SARS-CoV-2/FLU/RSV testing.  Fact Sheet for Patients: EntrepreneurPulse.com.au  Fact Sheet for Healthcare Providers: IncredibleEmployment.be  This test is not yet approved or cleared by the [place] FDA and has been authorized for detection and/or diagnosis of SARS-CoV-2 by FDA under an Emergency Use Authorization (EUA). This EUA will remain in effect (meaning this test can be used) for the duration of the COVID-19 declaration under Section 564(b)(1) of the Act, 21 U.S.C. section 360bbb-3(b)(1), unless the authorization is terminated or revoked.  Performed at [facility], [place] [address] [place], [place] [address]        Radiology Studies: No results found.  Scheduled Meds:  acyclovir  200 mg Oral BID   apixaban  2.5 mg Oral BID   atorvastatin  20 mg Oral Daily   calcium-vitamin D  1 tablet Oral Q breakfast   docusate sodium  100 mg Oral Daily   feeding supplement  237 mL Oral BID BM   ferrous sulfate  325 mg Oral Q supper   fluticasone  1 spray Each Nare Daily   levothyroxine  88 mcg Oral QAC breakfast   lidocaine  1 patch Transdermal Q24H   loratadine  10 mg Oral Daily   melatonin  5 mg Oral QHS    metoprolol succinate  50 mg Oral Daily   multivitamin with minerals  1 tablet Oral Daily   pantoprazole  40 mg Oral Daily   polyethylene glycol  17  g Oral Daily   Continuous Infusions:     LOS: 4 days   Time spent: 35 minutes   Geradine Girt, DO [facility]  If 7PM-7AM, please contact night-coverage [email or web address] 05/03/2021, 10:39 AM

## 2021-05-04 ENCOUNTER — Inpatient Hospital Stay (HOSPITAL_COMMUNITY): Payer: No Typology Code available for payment source

## 2021-05-04 DIAGNOSIS — M545 Low back pain, unspecified: Secondary | ICD-10-CM | POA: Diagnosis not present

## 2021-05-04 LAB — BASIC METABOLIC PANEL
Anion gap: 9 (ref 5–15)
BUN: 21 mg/dL (ref 8–23)
CO2: 30 mmol/L (ref 22–32)
Calcium: 9.1 mg/dL (ref 8.9–10.3)
Chloride: 92 mmol/L — ABNORMAL LOW (ref 98–111)
Creatinine, Ser: 1.17 mg/dL — ABNORMAL HIGH (ref 0.44–1.00)
GFR, Estimated: 46 mL/min — ABNORMAL LOW (ref >60)
Glucose, Bld: 102 mg/dL — ABNORMAL HIGH (ref 70–99)
Potassium: 4.2 mmol/L (ref 3.5–5.1)
Sodium: 131 mmol/L — ABNORMAL LOW (ref 135–145)

## 2021-05-04 LAB — CBC
HCT: 37.8 % (ref 36.0–46.0)
Hemoglobin: 12.1 g/dL (ref 12.0–15.0)
MCH: 30 pg (ref 26.0–34.0)
MCHC: 32 g/dL (ref 30.0–36.0)
MCV: 93.8 fL (ref 80.0–100.0)
Platelets: 307 10*3/uL (ref 150–400)
RBC: 4.03 MIL/uL (ref 3.87–5.11)
RDW: 15.1 % (ref 11.5–15.5)
WBC: 9.3 10*3/uL (ref 4.0–10.5)
nRBC: 0 % (ref 0.0–0.2)

## 2021-05-04 LAB — T4, FREE: Free T4: 1.27 ng/dL — ABNORMAL HIGH (ref 0.61–1.12)

## 2021-05-04 IMAGING — MR MR LUMBAR SPINE W/O CM
5 of 6 series · 33 of 48 positions shown · non-contrast
Comparison: Correlation made with the recent CT

CLINICAL DATA: Severe back pain with T12 compression fracture

EXAM:
MRI LUMBAR SPINE WITHOUT CONTRAST
TECHNIQUE: Multiplanar, multisequence MR imaging of the lumbar spine was
performed. No intravenous contrast was administered.

[Series 5: T1 · sagittal · 4.0mm · 0.81mm/px · 6 of 17 slices shown (1 of 3)]
[im 1/17]
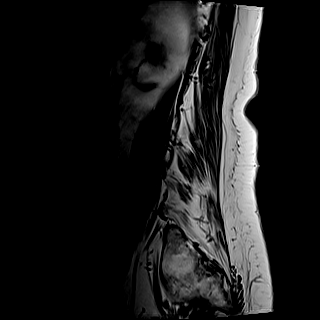
[im 4/17]
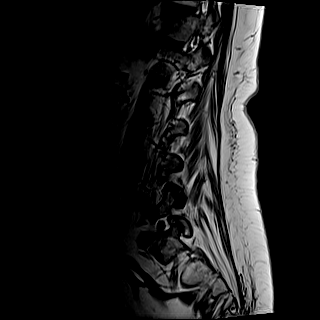
[im 7/17]
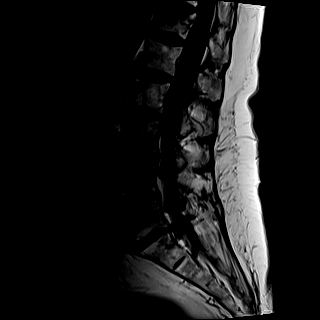
[im 10/17]
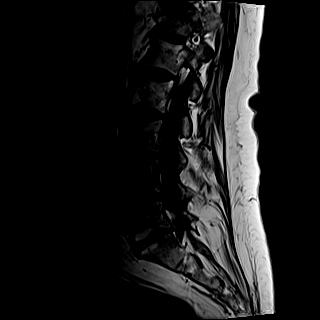
[im 13/17]
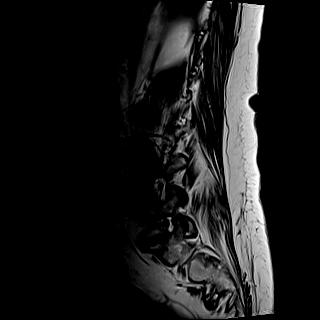
[im 17/17]
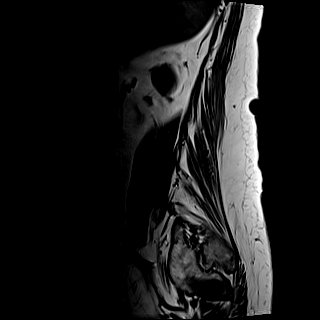

[Series 6: T2 · sagittal · 4.0mm · 0.81mm/px · 6 of 17 slices shown (1 of 2)]
[im 1/17]
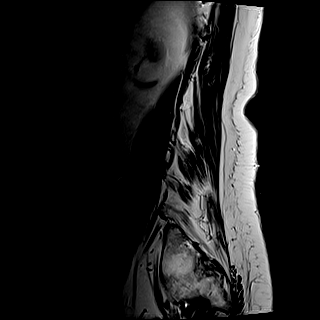
[im 4/17]
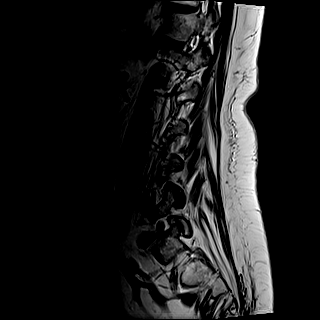
[im 7/17]
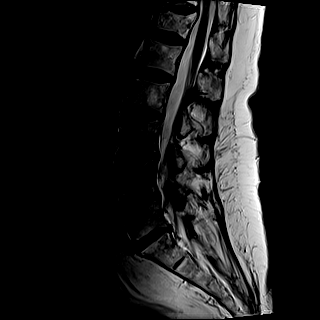
[im 10/17]
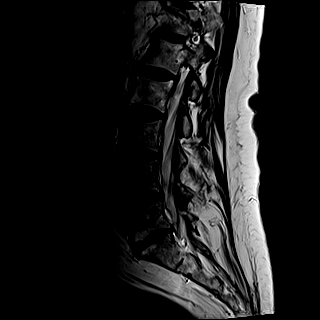
[im 13/17]
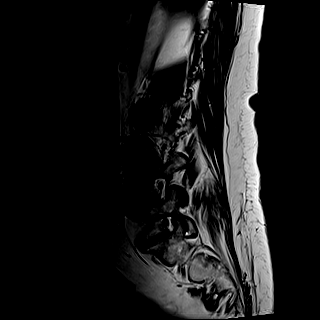
[im 17/17]
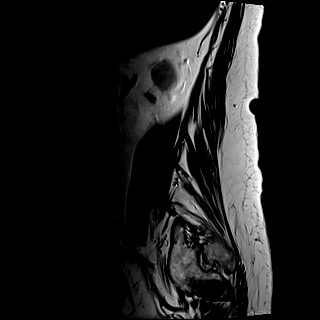

[Series 8: T2 · axial · 4.0mm · 0.62mm/px · z∈[+1,+233]mm · 9 of 43 slices shown (2 of 2)]
[im 1/43]
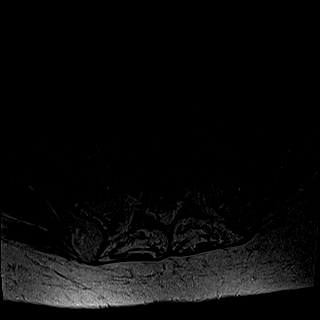
[im 8/43]
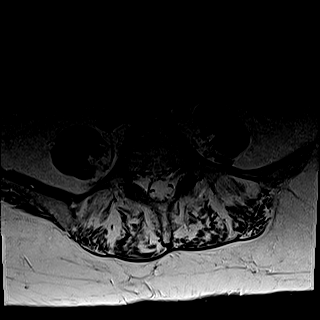
[im 15/43]
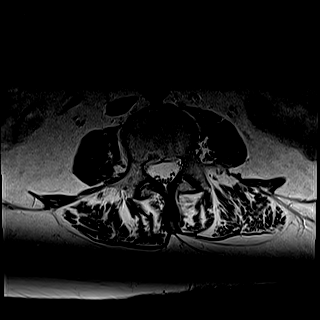
[im 18/43]
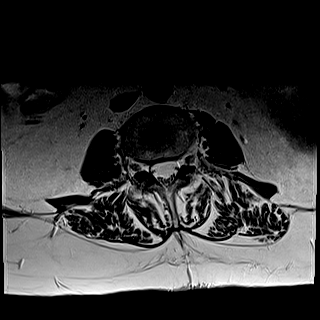
[im 22/43]
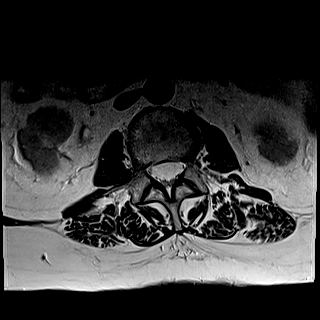
[im 25/43]
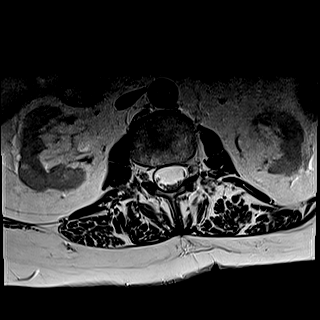
[im 29/43]
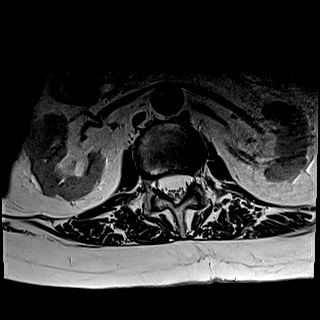
[im 36/43]
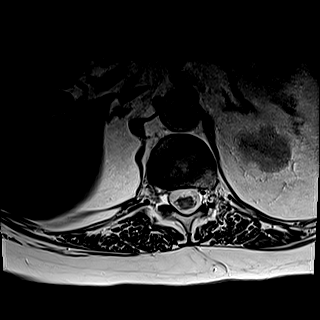
[im 43/43]
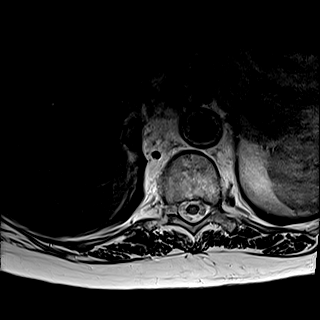

[Series 9: T1 · axial · 4.0mm · 0.39mm/px · z∈[+1,+233]mm · 9 of 43 slices shown (2 of 3)]
[im 1/43]
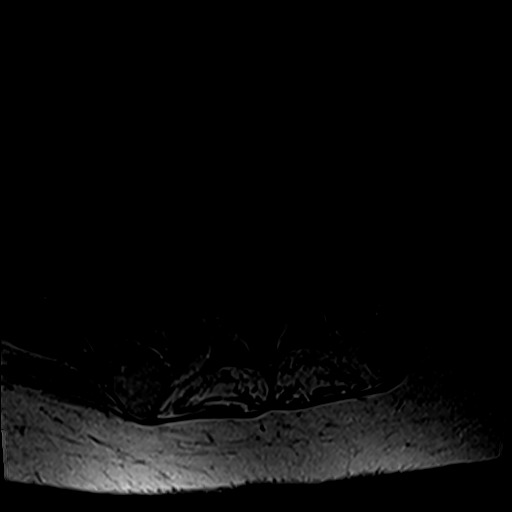
[im 8/43]
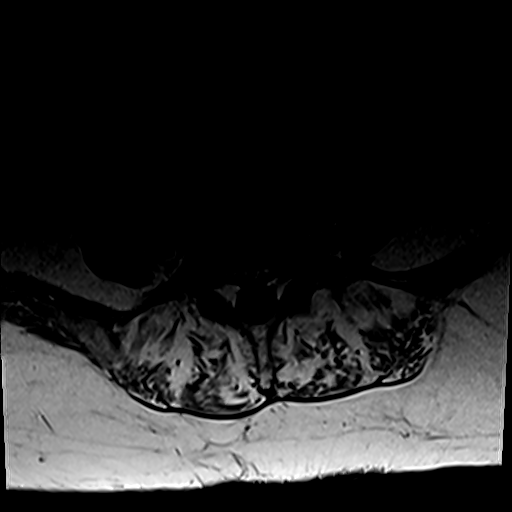
[im 15/43]
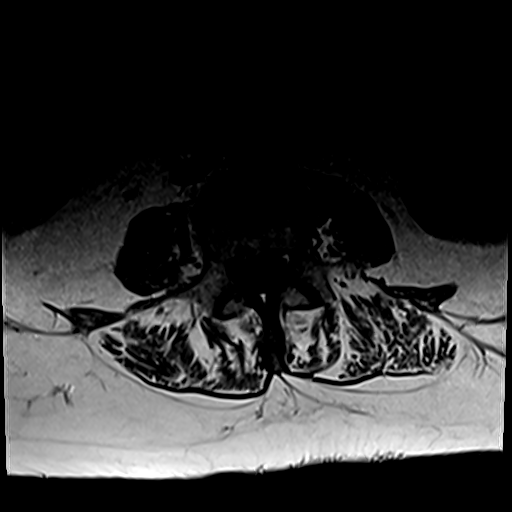
[im 18/43]
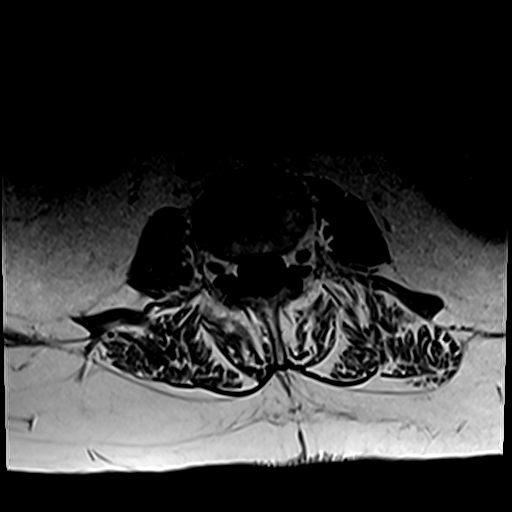
[im 22/43]
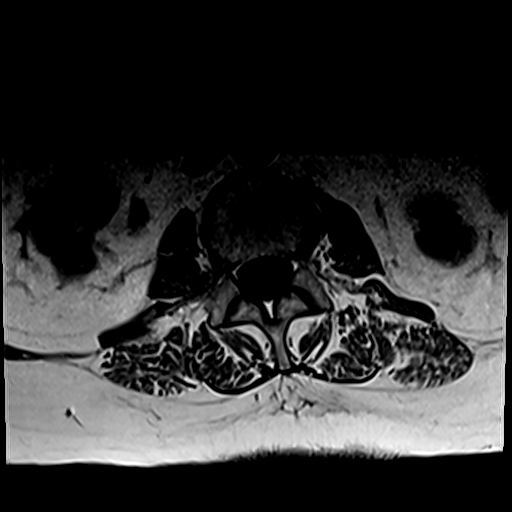
[im 25/43]
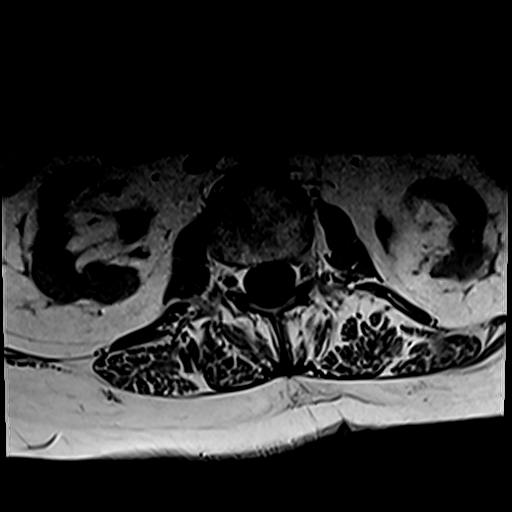
[im 29/43]
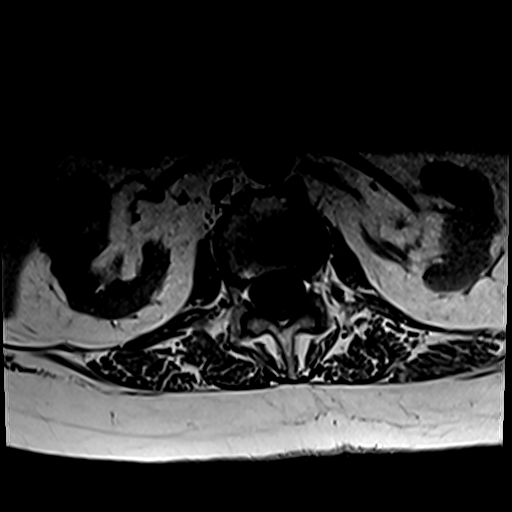
[im 36/43]
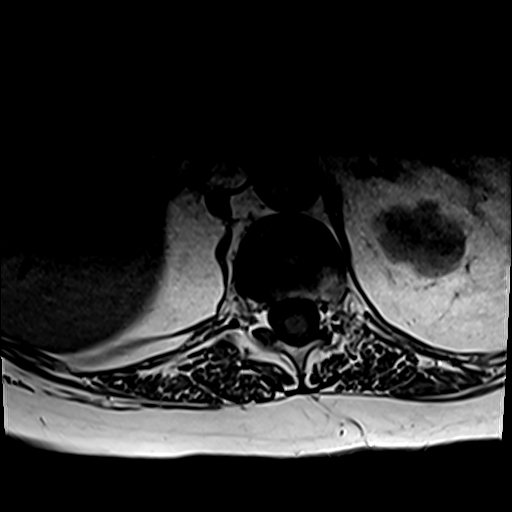
[im 43/43]
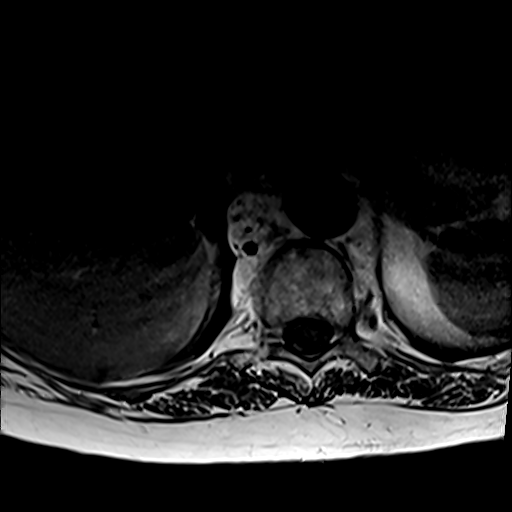

[Series 10: T1 · sagittal · 4.0mm · 0.81mm/px · 3 of 17 slices shown (3 of 3)]
[im 1/17]
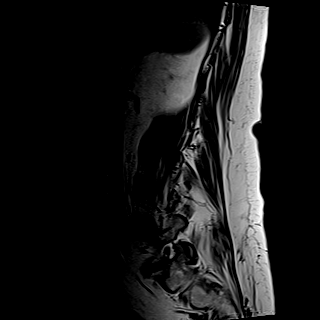
[im 5/17]
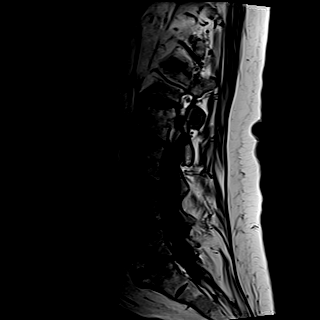
[im 9/17]
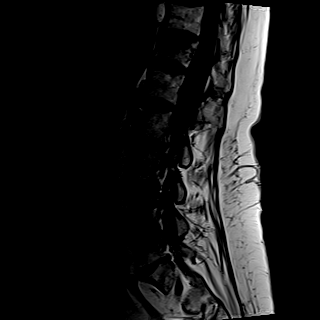

[33 of 48 positions shown; findings below may reference images not displayed]

FINDINGS: Segmentation:  Standard.

Alignment:  Minor endplate retropulsion at T12. dextrocurvature.

Vertebrae: T12 compression fracture with less than 50% loss of
height at the superior endplate. This is similar to the prior study.
Marrow edema is present. Lumbar vertebral body heights are
maintained. Mildly heterogeneous marrow signal likely reflects
decreased mineralization. No suspicious osseous lesion.

Conus medullaris and cauda equina: Conus extends to the L1-L2 level.
Conus and cauda equina appear normal.

Paraspinal and other soft tissues: Unremarkable.

Disc levels:

L1-L2:  Minimal disc bulge.  No canal or foraminal stenosis.

L2-L3: Minimal disc bulge eccentric to the right. No canal or
foraminal stenosis.

L3-L4:  Minimal disc bulge.  No canal or foraminal stenosis.

L4-L5: Minimal disc bulge. Mild facet arthropathy. No canal or
foraminal stenosis.

L5-S1: Disc space narrowing. Disc bulge with endplate osteophytic
ridging. Mild facet arthropathy. No canal or foraminal stenosis.
IMPRESSION: Acute to subacute T12 compression fracture with similar less than
50% loss of height and minor endplate retropulsion. No evidence to
suggest this is a pathologic fracture.

Mild lumbar spine degenerative changes.

## 2021-05-04 MED ORDER — GUAIFENESIN-DM 100-10 MG/5ML PO SYRP
5.0000 mL | ORAL_SOLUTION | ORAL | Status: DC | PRN
  Administered 2021-05-05 (×2): 5 mL via ORAL
  Filled 2021-05-04 (×2): qty 10

## 2021-05-04 MED ORDER — CIPROFLOXACIN HCL 0.3 % OP SOLN
2.0000 [drp] | OPHTHALMIC | Status: AC
  Administered 2021-05-06 – 2021-05-08 (×7): 2 [drp] via OPHTHALMIC
  Filled 2021-05-04: qty 2.5

## 2021-05-04 MED ORDER — SENNOSIDES-DOCUSATE SODIUM 8.6-50 MG PO TABS
1.0000 | ORAL_TABLET | Freq: Two times a day (BID) | ORAL | Status: DC
  Administered 2021-05-04 – 2021-05-11 (×8): 1 via ORAL
  Filled 2021-05-04 (×15): qty 1

## 2021-05-04 MED ORDER — METHOCARBAMOL 500 MG PO TABS: 500.0000 mg | ORAL_TABLET | Freq: Three times a day (TID) | ORAL | Status: DC

## 2021-05-04 MED ORDER — METHOCARBAMOL 500 MG PO TABS
500.0000 mg | ORAL_TABLET | Freq: Four times a day (QID) | ORAL | Status: AC
  Administered 2021-05-04 – 2021-05-06 (×12): 500 mg via ORAL
  Filled 2021-05-04 (×12): qty 1

## 2021-05-04 MED ORDER — OXYCODONE-ACETAMINOPHEN 5-325 MG PO TABS
1.0000 | ORAL_TABLET | Freq: Four times a day (QID) | ORAL | Status: AC
  Administered 2021-05-04 (×3): 1 via ORAL
  Filled 2021-05-04 (×3): qty 1

## 2021-05-04 MED ORDER — SODIUM CHLORIDE 0.9 % IV SOLN
INTRAVENOUS | Status: DC
Start: 2021-05-04 — End: 2021-05-06

## 2021-05-04 MED ORDER — BISACODYL 10 MG RE SUPP
10.0000 mg | Freq: Every day | RECTAL | Status: DC
Start: 2021-05-04 — End: 2021-05-12
  Administered 2021-05-04 – 2021-05-07 (×3): 10 mg via RECTAL
  Filled 2021-05-04 (×7): qty 1

## 2021-05-04 NOTE — Progress Notes (Addendum)
PROGRESS NOTE  Christina Fry  DOB: [birth year]  PCP: Ferdie Ping, MD DUK:[id number]  DOA: 04/28/2021  LOS: 5 days  Hospital Day: 7  Brief narrative: Christina Fry is a 81 y.o. female with PMH significant for DM2, HLD, Graves' disease, hypothyroidism, polymyalgia rheumatica, fibromyalgia, history of PE, GERD, anxiety who lives at an independent living facility, at baseline uses a motorized wheelchair at home but is able to take several steps and transferring chair and bed/toilet.  She completed a course of antibiotics for UTI last week but continued to have dysuria. On 2/14, while getting up from her couch, patient sustained a sudden onset of sharp back pain from the lower thoracic area to the hip.  It remained constant for 24 hours and did not respond to tramadol or topical cream.  She then presented to the ED on 2/15. In the ED, patient was hemodynamically stable. Labs with potassium low at 3.1, calcium low at 5.9, albumin 2.3, hemoglobin low at 9.2, glucose low at 67 Urinalysis with clear amber color urine positive nitrite. CT abdomen pelvis showed interval development of mild superior endplate depression of B76 vertebral body concerning for fracture of indeterminate age.  ED physician discussed the case with neurosurgeon Dr. [name].  He recommended TLSO, PT OT, WBAT and a follow-up in the office after 2 weeks. In the ED, she required fentanyl IV for pain control. Admitted to hospitalist service primarily for pain management.  Subjective: Patient was seen and examined this morning.   For the last 1 week since admission, patient has not been out of bed because of pain.  This morning she would not even let me touch her bed because she was afraid that will shake the bed and incite her pain at the site of fracture.  Principal Problem:   Back pain Active Problems:   Essential hypertension   Hypothyroidism   Hypokalemia   Hypocalcemia   Hypomagnesemia    Assessment and Plan: Acute T12  vertebral fracture Intractable pain -Presented with sudden onset back pain, persistent for 24 hours, not responding to outpatient pain medicines -Imaging as above showing T12 vertebral body fracture -ED physician discussed with neurosurgeon.  Recommended TLSO brace, WBAT, PT OT -PT eval ordered. -She is currently on PRN Percocet, PRN Robaxin and lidocaine patch.   -She still has intractable pain and has not moved since admission.  I would switch her to scheduled Percocet, scheduled Robaxin and as needed oxycodone. -With the degree of intractable pain, she may benefit from kyphoplasty.  IR consulted.  Recommended MRI lumbar spine without contrast.  I would keep Eliquis on hold in anticipation of kyphoplasty.  Please resume if the procedure is not recommended by IR.  Heparin subcu for DVT prophylaxis for now -Outpatient follow-up with neurosurgery in 2 weeks of fracture.  Constipation -Reports no bowel movement since admission. -Getting daily MiraLAX.  Add Senokot and Dulcolax suppository.  Acute hyponatremia -Sodium level trending down, low at 131 today.  Probably because of low solute intake. -Encourage oral intake. -Since creatinine is also slightly up today, start on hydration with NS at 50 mill per hour. Recent Labs  Lab 04/28/21 1130 04/28/21 1800 04/29/21 0111 05/01/21 0804 05/04/21 0619  NA 139 138 137 135 131*   Hypokalemia/hypomagnesemia -Levels were low on admission.  Improved with replacement.  Hypocalcemia -Calcium level was low on admission at 5.9.  Improved with replacement.    Hx of PE -Currently on eliquis   Essential hypertension -Home meds include Toprol  50 mg daily,  -Continue same.    GERD  -PPI   Chronic macrocytic anemia Vitamin B12 deficiency Iron deficiency -Continue vitamin B12 and iron supplement.  Hemoglobin stable above 10. Recent Labs    08/26/20 1623 08/27/20 0638 08/27/20 1243 08/28/20 0645 04/28/21 1130 04/29/21 1430 05/01/21 0804  05/04/21 0619  HGB 9.6* 8.9* 9.4* 8.6* 9.2* 10.8* 10.8* 12.1   Abnormal urinalysis -Urinalysis with clear amber color urine positive nitrite. -No symptoms.  Urine culture showed insignificant growth of less than 10,000 CFU per mL.  Not on antibiotics  Right eye conjunctivitis -Noted redness, watering and crusting with no vision improvement. -Ciprofloxacin eyedrops ordered for 5 days  Mobility: SNF recommended by PT Goals of care   Code Status: DNR    Nutritional status:  Body mass index is 31.16 kg/m.  Nutrition Problem: Increased nutrient needs Etiology: acute illness Signs/Symptoms: estimated needs Diet:  Diet Order             Diet Heart Room service appropriate? Yes; Fluid consistency: Thin  Diet effective now                   DVT prophylaxis: Keep Eliquis on hold pending determination of hypoplasty.  Heparin subcu for now    Antimicrobials: None.  Keep Eliquis on hold pending determination of hypoplasty.  Heparin subcu for now Fluid: None Consultants: None Family Communication: None at bedside  Status is: Inpatient  Continue in-hospital care because: Patient apparently has a bed available at SNF today.  However she still has intractable pain and has not moved out of bed since admission.  She may qualify for kyphoplasty.  We will hold off discharge today. Level of care: Telemetry   Dispo: The patient is from: Independent living facility              Anticipated d/c is to: SNF after pain control              patient currently is not medically stable to d/c. Difficult to place patient No     Infusions:   sodium chloride 50 mL/hr at 05/04/21 1100     Scheduled Meds:  acyclovir  200 mg Oral BID   atorvastatin  20 mg Oral Daily   bisacodyl  10 mg Rectal Daily   calcium carbonate  500 mg Oral BID   calcium-vitamin D  1 tablet Oral Q breakfast   cholecalciferol  1,000 Units Oral Daily   ciprofloxacin  2 drop Right Eye Q4H while awake   feeding  supplement  237 mL Oral BID BM   ferrous sulfate  325 mg Oral Q supper   fluticasone  1 spray Each Nare Daily   gabapentin  200 mg Oral BID   levothyroxine  88 mcg Oral QAC breakfast   lidocaine  1 patch Transdermal Q24H   loratadine  10 mg Oral Daily   melatonin  5 mg Oral QHS   methocarbamol  500 mg Oral QID   metoprolol succinate  50 mg Oral Daily   multivitamin with minerals  1 tablet Oral Daily   oxyCODONE-acetaminophen  1 tablet Oral Q6H   pantoprazole  40 mg Oral Daily   polyethylene glycol  17 g Oral Daily   senna-docusate  1 tablet Oral BID    PRN meds: acetaminophen **OR** acetaminophen, albuterol, hydrOXYzine, oxyCODONE   Antimicrobials: Anti-infectives (From admission, onward)    Start     Dose/Rate Route Frequency Ordered Stop   04/29/21 1300  acyclovir (  ZOVIRAX) 200 MG capsule 200 mg        200 mg Oral 2 times daily 04/29/21 0959         Objective: Vitals:   05/04/21 0455 05/04/21 0952  BP: (!) 141/63 139/73  Pulse: 85 94  Resp: 15   Temp:    SpO2: 92%     Intake/Output Summary (Last 24 hours) at 05/04/2021 1138 Last data filed at 05/04/2021 1100 Gross per 24 hour  Intake 235 ml  Output 400 ml  Net -165 ml   Filed Weights   04/30/21 1055  Weight: 79.8 kg   Weight change:  Body mass index is 31.16 kg/m.   Physical Exam: General exam: Pleasant, elderly Caucasian female.  Not moving at all to avoid pain. Skin: No rashes, lesions or ulcers. HEENT: Atraumatic, normocephalic, no obvious bleeding.  Right eye crusting and red with no vision impairment Lungs: Clear to auscultation bilaterally CVS: Regular rate and rhythm, no murmur GI/Abd soft, nontender, nondistended, bowel sound present CNS: Alert, awake, oriented x3 Psychiatry: Anxious Extremities: Puffy bilateral lower extremity, no clear pitting edema.  No calf tenderness  Data Review: I have personally reviewed the laboratory data and studies available.  F/u labs ordered Unresulted Labs  (From admission, onward)     Start     Ordered   05/05/21 7654  Basic metabolic panel  Tomorrow morning,   R       Question:  Specimen collection method  Answer:  Lab=Lab collect   05/04/21 1027   05/05/21 0500  CBC with Differential/Platelet  Tomorrow morning,   R       Question:  Specimen collection method  Answer:  Lab=Lab collect   05/04/21 1027   05/04/21 1028  T4, free  Add-on,   AD       Question:  Specimen collection method  Answer:  Lab=Lab collect   05/04/21 1027            Signed, Terrilee Croak, MD [facility] 05/04/2021

## 2021-05-04 NOTE — Progress Notes (Signed)
Vascular and Interventional Radiology  Brief Consult Note  Patient: Christina Fry DOB: Jun 26, 1940 Medical Record Number: [id number] Note Date/Time: 05/04/21 8:42 PM   Admitting Diagnosis:  Hypocalcemia [E83.51] Hypokalemia [E87.6] Back pain [M54.9] Compression fracture of T12 vertebra, initial encounter ([place]) [E95.284X]  1. Compression fracture of T12 vertebra, initial encounter ([place])   2. Hypocalcemia   3. Hypokalemia      Assessment   Plan: 81 y.o. year old female who presented with back pain, CT L spine w T12 superior endplate fracture. S/p NSGY eval, recommended TLSO. Pt with refractory pain despite conservative management. Hospitalist reached out to [place] [facility] to evaluate for potential kyphoplasty.  *MR L spine requested, obtained and reviewed. *No additional imaging required. VIR to plan for T12 Kyphoplasty.  *Full Consult Note to follow. Insurance pending.  *Procedural date to be determined. Either end of week at earliest (Friday, 05/07/21) or more likely beginning of next week (Monday, 05/10/21)   Thank you for allowing [place] to participate in the care of your Patient. Please contact the [place] Team with questions, concerns, or if new issues arise.  Michaelle Birks, MD [facility] Radiology   Pager. [place]

## 2021-05-04 NOTE — TOC Progression Note (Signed)
Transition of Care [facility]) - Progression Note    Patient Details  Name: Christina Fry MRN: [id number] Date of Birth: [birth year]  Transition of Care [facility]) CM/SW Contact  Otniel Hoe, Marjie Skiff, RN Phone Number: 05/04/2021, 10:51 AM  Clinical Narrative:    Per MD pt not medically ready to go today. Spoke with [name] to provide SNF bed offers. Alerted [name] that we would need to touch base with the SNFs that offered a bed closer to dc to see if beds are still available. TOC will continue to follow.        Readmission Risk Interventions Readmission Risk Prevention Plan 08/28/2020 12/06/2019  Transportation Screening Complete Complete  HRI or Home Care Consult Complete -  Social Work Consult for [place] Planning/Counseling Complete Complete  Palliative Care Screening Not Applicable -  Medication Review [employer] photographer) Complete Complete  Some recent data might be hidden

## 2021-05-05 DIAGNOSIS — H1031 Unspecified acute conjunctivitis, right eye: Secondary | ICD-10-CM

## 2021-05-05 DIAGNOSIS — D649 Anemia, unspecified: Secondary | ICD-10-CM

## 2021-05-05 DIAGNOSIS — E871 Hypo-osmolality and hyponatremia: Secondary | ICD-10-CM

## 2021-05-05 DIAGNOSIS — Z993 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: Secondary | ICD-10-CM

## 2021-05-05 DIAGNOSIS — E538 Deficiency of other specified B group vitamins: Secondary | ICD-10-CM

## 2021-05-05 DIAGNOSIS — D509 Iron deficiency anemia, unspecified: Secondary | ICD-10-CM

## 2021-05-05 DIAGNOSIS — Z86711 Personal history of pulmonary embolism: Secondary | ICD-10-CM

## 2021-05-05 DIAGNOSIS — M545 Low back pain, unspecified: Secondary | ICD-10-CM | POA: Diagnosis not present

## 2021-05-05 DIAGNOSIS — S22080A Wedge compression fracture of T11-T12 vertebra, initial encounter for closed fracture: Secondary | ICD-10-CM

## 2021-05-05 LAB — CBC WITH DIFFERENTIAL/PLATELET
Abs Immature Granulocytes: 0.03 10*3/uL (ref 0.00–0.07)
Basophils Absolute: 0.1 10*3/uL (ref 0.0–0.1)
Basophils Relative: 1 %
Eosinophils Absolute: 0.2 10*3/uL (ref 0.0–0.5)
Eosinophils Relative: 3 %
HCT: 35.9 % — ABNORMAL LOW (ref 36.0–46.0)
Hemoglobin: 11 g/dL — ABNORMAL LOW (ref 12.0–15.0)
Immature Granulocytes: 0 %
Lymphocytes Relative: 22 %
Lymphs Abs: 2 10*3/uL (ref 0.7–4.0)
MCH: 29.7 pg (ref 26.0–34.0)
MCHC: 30.6 g/dL (ref 30.0–36.0)
MCV: 97 fL (ref 80.0–100.0)
Monocytes Absolute: 1 10*3/uL (ref 0.1–1.0)
Monocytes Relative: 11 %
Neutro Abs: 5.8 10*3/uL (ref 1.7–7.7)
Neutrophils Relative %: 63 %
Platelets: 299 10*3/uL (ref 150–400)
RBC: 3.7 MIL/uL — ABNORMAL LOW (ref 3.87–5.11)
RDW: 14.9 % (ref 11.5–15.5)
WBC: 9.1 10*3/uL (ref 4.0–10.5)
nRBC: 0 % (ref 0.0–0.2)

## 2021-05-05 LAB — BASIC METABOLIC PANEL
Anion gap: 6 (ref 5–15)
BUN: 22 mg/dL (ref 8–23)
CO2: 28 mmol/L (ref 22–32)
Calcium: 8.8 mg/dL — ABNORMAL LOW (ref 8.9–10.3)
Chloride: 97 mmol/L — ABNORMAL LOW (ref 98–111)
Creatinine, Ser: 1.14 mg/dL — ABNORMAL HIGH (ref 0.44–1.00)
GFR, Estimated: 48 mL/min — ABNORMAL LOW (ref >60)
Glucose, Bld: 110 mg/dL — ABNORMAL HIGH (ref 70–99)
Potassium: 4 mmol/L (ref 3.5–5.1)
Sodium: 131 mmol/L — ABNORMAL LOW (ref 135–145)

## 2021-05-05 LAB — ALBUMIN: Albumin: 2.8 g/dL — ABNORMAL LOW (ref 3.5–5.0)

## 2021-05-05 MED ORDER — MORPHINE SULFATE (PF) 2 MG/ML IV SOLN
2.0000 mg | INTRAVENOUS | Status: DC | PRN
  Administered 2021-05-05 – 2021-05-11 (×16): 2 mg via INTRAVENOUS
  Filled 2021-05-05 (×16): qty 1

## 2021-05-05 MED ORDER — ACETAMINOPHEN 325 MG PO TABS: 650.0000 mg | ORAL_TABLET | Freq: Four times a day (QID) | ORAL | Status: DC | PRN

## 2021-05-05 MED ORDER — ACETAMINOPHEN 500 MG PO TABS
1000.0000 mg | ORAL_TABLET | Freq: Three times a day (TID) | ORAL | Status: AC
  Administered 2021-05-05 – 2021-05-07 (×9): 1000 mg via ORAL
  Filled 2021-05-05 (×9): qty 2

## 2021-05-05 MED ORDER — OXYCODONE HCL 5 MG PO TABS
10.0000 mg | ORAL_TABLET | ORAL | Status: DC | PRN
  Administered 2021-05-05 – 2021-05-06 (×3): 10 mg via ORAL
  Filled 2021-05-05 (×3): qty 2

## 2021-05-05 MED ORDER — VITAMIN B-12 1000 MCG PO TABS
1000.0000 ug | ORAL_TABLET | Freq: Every day | ORAL | Status: DC
Start: 2021-05-05 — End: 2021-05-12
  Administered 2021-05-05 – 2021-05-12 (×7): 1000 ug via ORAL
  Filled 2021-05-05 (×7): qty 1

## 2021-05-05 NOTE — Assessment & Plan Note (Signed)
Continue b12, iron supplementation

## 2021-05-05 NOTE — Assessment & Plan Note (Signed)
improved

## 2021-05-05 NOTE — Assessment & Plan Note (Signed)
PPI ?

## 2021-05-05 NOTE — Assessment & Plan Note (Addendum)
cipro drops

## 2021-05-05 NOTE — Hospital Course (Addendum)
Christina Fry is Christina Fry 81 y.o. female with PMH significant for DM2, HLD, Graves' disease, hypothyroidism, polymyalgia rheumatica, fibromyalgia, history of PE, GERD, anxiety who lives at an independent living facility, at baseline uses Christina Fry motorized wheelchair at home but is able to take several steps and transferring chair and bed/toilet.  On 2/14, while getting up from her couch, patient sustained Christina Fry sudden onset of sharp back pain from the lower thoracic area to the hip.  It remained constant for 24 hours and did not respond to tramadol or topical cream.  She then presented to the ED on 2/15.  She was found to have interval development of mild superior endplate depression of Christina Fry vetebral body concerning for fracture of indeterminate age.  Neurosurgery recommended TLSO, PT/OT, and WBAT.  She continued to have significant pain despite pain meds and MRI L spine obtained on 2/21 showing acute to subacute t12 compression fracture with less than 50% loss of height and minor endplate retropulsion.  Due to persistent, intractable pain, plan was made for kyphoplasty.  She's now s/p kyphoplasty with IR on 2/27.  She should be stable for discharge on 3/1 if SNF bed ready.

## 2021-05-05 NOTE — Assessment & Plan Note (Signed)
Metoprolol 

## 2021-05-05 NOTE — Progress Notes (Signed)
Physical Therapy Treatment Patient Details Name: Christina Fry MRN: [id number] DOB: October 30, 1940 Today's Date: 05/05/2021   History of Present Illness Christina Fry is a 81 y.o. female with PMH significant for DM2, HLD, Graves' disease, hypothyroidism, polymyalgia rheumatica, fibromyalgia, history of PE, GERD, anxiety who lives at an independent living facility, at baseline uses a motorized wheelchair at home but is able to take several steps and transferring chair and bed/toilet.  She completed a course of antibiotics for UTI last week but continued to have dysuria.  On 2/14, while getting up from her couch, patient sustained a sudden onset of sharp back pain from the lower thoracic area to the hip.  It remained constant for 24 hours and did not respond to tramadol or topical cream.  She then presented to the ED on 2/15.  In the ED, patient was hemodynamically stable.  Labs with potassium low at 3.1, calcium low at 5.9, albumin 2.3, hemoglobin low at 9.2, glucose low at 67  Urinalysis with clear amber color urine positive nitrite.  CT abdomen pelvis showed interval development of mild superior endplate depression of W09 vertebral body concerning for fracture of indeterminate age.   ED physician discussed the case with neurosurgeon Dr. [name].  He recommended TLSO, PT OT, WBAT and a follow-up in the office after 2 weeks.  In the ED, she required fentanyl IV for pain control.  Admitted to hospitalist service primarily for pain management.    PT Comments    General Comments: AxO x 3 pt has a very strong personality and she ststes it's from "years in the [employer]".  Was pleasant and cooperative after a brief interrogation of my skills.  "I like to know who you are and what is your name".  Pt was also for warned of my arrival and pre medicated. (IV Morphine) Assisted OOB was difficult and required increased time.  General bed mobility comments: demonstarted and educated on proper "log roll" technique.  Required  increased time and repeat instruction to decrease her anxiety and allow time to process.  Pt required Mod Assist to roll and Max Assist to sit to EOB.  Once EOB, pt reported feeling dizzy.  Ensured her this was very normal and I stood close guard in front to allow increased time to settle.  Pt tolerated sitting EOB at Close Supervision x 8 min.  Then applied TLSO.  Had to adjust upper straps such the they would fit UNDER arm pits (NOT OVER shoulders).  Pt reported 8/10 pain but was NOT screaming as prior attempts.  Overall EOB sitting time was 14 min as student nurse arrived to give meds. Pt required MAX Assist to don brace.  "There's no way I am going to be able to get this on by myself".  General transfer comment: "This is how I do it" pt prefers chair on her LEFT. Pt was able to rise from bed with increased time at [place] self with B UE's on bed and recliner.  Did NOT use a walker.  Pt performs a partial upright stance then take a few pivot steps holding onto recliner arm rest to complete her turn "the long way" first facing the recliner.  Pt was able to support her weight and functionally complete turn. General Gait Details: transfers only this session. Pt uses a Estate manager/land agent at [employer], so transfer training only this session. Positioned pt in recliner fully upright with 90 degree hip flex and 90 degree knee flex.  Pt resides at [facility] and will need ST Rehab at SNF prior to safely returning.   Recommendations for follow up therapy are one component of a multi-disciplinary discharge planning process, led by the attending physician.  Recommendations may be updated based on patient status, additional functional criteria and insurance authorization.  Follow Up Recommendations  Skilled nursing-short term rehab (<3 hours/day)     Assistance Recommended at Discharge Frequent or constant Supervision/Assistance  Patient can return home with the following Two people  to help with walking and/or transfers;Two people to help with bathing/dressing/bathroom;Assistance with cooking/housework;Direct supervision/assist for medications management;Direct supervision/assist for financial management;Assist for transportation;Help with stairs or ramp for entrance   Equipment Recommendations  None recommended by PT    Recommendations for Other Services       Precautions / Restrictions Precautions Precautions: Back Required Braces or Orthoses: Spinal Brace Spinal Brace: Thoracolumbosacral orthotic;Applied in sitting position Restrictions Weight Bearing Restrictions: No     Mobility  Bed Mobility Overal bed mobility: Needs Assistance Bed Mobility: Rolling Rolling: Max assist, Total assist         General bed mobility comments: demonstarted and educated on proper "log roll" technique.  Required increased time and repeat instruction to decrease her anxiety and allow time to process.  Pt required Mod Assist to roll and Max Assist to sit to EOB.  Once EOB, pt reported feeling dizzy.  Ensured her this was very normal and I stood close guard in front to allow increased time to settle.  Pt tolerated sitting EOB at Close Supervision x 8 min.  Then applied TLSO.  Had to adjust upper straps such the they would fit UNDER arm pits (NOT OVER shoulders).  Pt reported 8/10 pain but was NOT screaming as prior attempts.  Overall EOB sitting time was 14 min as student nurse arrived to give meds.    Transfers Overall transfer level: Needs assistance Equipment used: 1 person hand held assist Transfers: Sit to/from Stand, Bed to chair/wheelchair/BSC Sit to Stand: Min assist   Step pivot transfers: Min assist       General transfer comment: "This is how I do it" pt prefers chair on her LEFT. Pt was able to rise from bed with increased time at [place] self with B UE's on bed and recliner.  Did NOT use a walker.  Pt performs a partial upright stance then take a few  pivot steps holding onto recliner arm rest to complete her turn "the long way" first facing the recliner.  Pt was able to support her weight and functionally complete turn.    Ambulation/Gait               General Gait Details: transfers only this session   Stairs             Wheelchair Mobility    Modified Rankin (Stroke Patients Only)       Balance                                            Cognition Arousal/Alertness: Awake/alert Behavior During Therapy: WFL for tasks assessed/performed Overall Cognitive Status: Within Functional Limits for tasks assessed                                 General Comments: AxO x  3 pt has a very strong personality and she ststes it's from "years in the [employer]".  Was pleasant and cooperative after a brief interrogation of my skills.  "I like to know who you are and what is your name".  Pt was also for warned of my arrival and pre medicated.        Exercises      General Comments        Pertinent Vitals/Pain Pain Assessment Pain Assessment: 0-10 Pain Score: 8  Pain Location: back Pain Descriptors / Indicators: Sharp Pain Intervention(s): Monitored during session, Premedicated before session, Repositioned    Home Living                          Prior Function            PT Goals (current goals can now be found in the care plan section) Acute Rehab PT Goals Patient Stated Goal: get back to ILF, loves it at [place] towards PT goals: Progressing toward goals    Frequency    Min 3X/week      PT Plan Current plan remains appropriate    Co-evaluation              AM-PAC PT "6 Clicks" Mobility   Outcome Measure  Help needed turning from your back to your side while in a flat bed without using bedrails?: A Lot Help needed moving from lying on your back to sitting on the side of a flat bed without using bedrails?: A Lot Help needed moving to and  from a bed to a chair (including a wheelchair)?: A Lot Help needed standing up from a chair using your arms (e.g., wheelchair or bedside chair)?: A Lot Help needed to walk in hospital room?: Total Help needed climbing 3-5 steps with a railing? : Total 6 Click Score: 10    End of Session Equipment Utilized During Treatment: Gait belt Activity Tolerance: Patient tolerated treatment well Patient left: in chair;with call bell/phone within reach Nurse Communication: Mobility status PT Visit Diagnosis: Other abnormalities of gait and mobility (R26.89);Muscle weakness (generalized) (M62.81);Difficulty in walking, not elsewhere classified (R26.2);Pain     Time: 1300-1325 PT Time Calculation (min) (ACUTE ONLY): 25 min  Charges:  $Therapeutic Activity: 23-37 mins                     Rica Koyanagi  PTA Acute  Rehabilitation Services Pager      [phone] Office      [phone]

## 2021-05-05 NOTE — Consult Note (Signed)
Chief Complaint: Patient was seen in consultation today for T12 kyphoplasty Chief Complaint  Patient presents with   Back Pain    Referring Physician(s): Dahal,B  Supervising Physician: Michaelle Birks  Patient Status: [facility] - In-pt  History of Present Illness: Christina Fry is an 81 y.o. female with PMH sig for anxiety, DM,fibromyalgia, PE on eliquis, anemia, vit B12/iron def, HTN, GERD,Grave's disease, HLD,PMR who was admitted to [facility] on 04/28/21 with acute onset of mid to lower back pain after rising up from edge of bed. She lives at an independent living facility, at baseline uses a motorized wheelchair at home but is able to take several steps and transferring to chair and bed/toilet.  She completed a course of antibiotics for UTI recently but continues to have dysuria. The back pain remained constant for 24 hours and did not respond to tramadol or topical cream. The pain is now exacerbated with any movement and does radiate some towards left flank. MRI L spine yesterday revealed:   Acute to subacute T12 compression fracture with similar less than 50% loss of height and minor endplate retropulsion. No evidence to suggest this is a pathologic fracture.   Mild lumbar spine degenerative changes.  She remains on lidoderm patch, neurontin, robaxin, oxycodone and morphine inj prn. Her case was discussed with NS by [facility] and they suggested TLSO, PT/OT, WBAT and f/u in their office in 2 weeks. Request now received from [facility] to evaluate pt for possible T12 vertebral body augmentation. She is afebrile, WBC 9.1, hgb 11, plts nl, creat 1.14, COVID 19 neg, urine cx with < 10k colonies/insig growth.   Past Medical History:  Diagnosis Date   Anxiety    Cancer ([place])    Diabetes mellitus without complication ([place])    Fibromyalgia    GERD (gastroesophageal reflux disease)    Grave's disease    Herpes    Hyperlipidemia    Mild intermittent asthma without complication 8/93/8101   Palpitation     Polymyalgia rheumatica ([place])     Past Surgical History:  Procedure Laterality Date   ABDOMINAL HYSTERECTOMY     APPENDECTOMY     CARPAL TUNNEL RELEASE     HERNIA REPAIR     INCISIONAL HERNIA REPAIR  03/20/2012   Procedure: LAPAROSCOPIC INCISIONAL HERNIA;  Surgeon: Jamesetta So, MD;  Location: [facility];  Service: General;  Laterality: N/A;   INSERTION OF MESH  03/20/2012   Procedure: INSERTION OF MESH;  Surgeon: Jamesetta So, MD;  Location: [facility];  Service: General;  Laterality: N/A;   TOTAL ABDOMINAL HYSTERECTOMY W/ BILATERAL SALPINGOOPHORECTOMY      Allergies: Iohexol, Codeine, Fentanyl, Morphine and related, Nitrofuran derivatives, and Oxycodone-acetaminophen  Medications: Prior to Admission medications   Medication Sig Start Date End Date Taking? Authorizing Provider  acetaminophen (TYLENOL) 325 MG tablet Take 975 mg by mouth 2 (two) times daily.   Yes [provider]  acyclovir (ZOVIRAX) 200 MG capsule Take 1 capsule (200 mg total) by mouth 2 (two) times daily. 12/10/19  Yes Shelly Coss, MD  albuterol (VENTOLIN HFA) 108 (90 Base) MCG/ACT inhaler Inhale 2 puffs into the lungs every 6 (six) hours as needed for wheezing or shortness of breath.   Yes [provider]  apixaban (ELIQUIS) 2.5 MG TABS tablet Take 2.5 mg by mouth 2 (two) times daily.   Yes [provider]  atorvastatin (LIPITOR) 40 MG tablet Take 20 mg by mouth daily.   Yes [provider]  budesonide (RHINOCORT  AQUA) 32 MCG/ACT nasal spray one spray per nostril daily (aim for the ear on each side Patient taking differently: 1 spray 2 (two) times daily as needed for rhinitis. 06/29/20  Yes Valentina Shaggy, MD  calcium carbonate (TUMS - DOSED IN MG ELEMENTAL CALCIUM) 500 MG chewable tablet Chew 500 mg by mouth 2 (two) times daily.   Yes [provider]  cholecalciferol (VITAMIN D3) 25 MCG (1000 UNIT) tablet Take 1,000 Units by mouth daily.   Yes [provider]   EPINEPHrine 0.3 mg/0.3 mL IJ SOAJ injection Inject 0.3 mg into the muscle as needed for anaphylaxis. Patient taking differently: Inject 0.3 mg into the muscle once as needed for anaphylaxis. 04/12/21  Yes Valentina Shaggy, MD  gabapentin (NEURONTIN) 100 MG capsule Take 200 mg by mouth 2 (two) times daily.   Yes [provider]  hydrOXYzine (ATARAX/VISTARIL) 10 MG tablet Take 1 tablet (10 mg total) by mouth 2 (two) times daily as needed for itching. 11/29/19  Yes Dahal, Marlowe Aschoff, MD  levothyroxine (SYNTHROID) 88 MCG tablet Take 88 mcg by mouth daily before breakfast.   Yes [provider]  melatonin 5 MG TABS Take 5 mg by mouth at bedtime.   Yes [provider]  Metoprolol Succinate 50 MG CS24 Take 50 mg by mouth daily.   Yes [provider]  NON FORMULARY Inject 1 Dose into the skin once a week. Allergy shots; patient not sure about name   Yes [provider]  nortriptyline (PAMELOR) 25 MG capsule Take 50 mg by mouth at bedtime.   Yes [provider]  omeprazole (PRILOSEC) 20 MG capsule Take 20 mg by mouth 2 (two) times daily.   Yes [provider]  Pediatric Multiple Vitamins (FLINSTONES GUMMIES OMEGA-3 DHA) CHEW Chew 1 tablet by mouth daily.   Yes [provider]  Propylene Glycol (SYSTANE COMPLETE OP) Place 1 drop into both eyes See admin instructions. Five times a day   Yes [provider]  traMADol (ULTRAM) 50 MG tablet Take 50 mg by mouth 2 (two) times daily as needed for moderate pain.   Yes [provider]  calcium-vitamin D (OSCAL WITH D) 500-200 MG-UNIT tablet Take 1 tablet by mouth daily with breakfast. Patient not taking: Reported on 04/28/2021 11/30/19   Terrilee Croak, MD  docusate sodium (COLACE) 100 MG capsule Take 1 capsule (100 mg total) by mouth daily. Patient not taking: Reported on 04/28/2021 08/28/20 08/28/21  Heath Lark D, DO  feeding supplement, GLUCERNA SHAKE, (GLUCERNA SHAKE) LIQD Take  237 mLs by mouth 3 (three) times daily between meals. Patient not taking: Reported on 04/28/2021 08/28/20   Heath Lark D, DO  ferrous sulfate 325 (65 FE) MG tablet Take 1 tablet (325 mg total) by mouth daily. Patient not taking: Reported on 04/28/2021 08/28/20 08/28/21  Heath Lark D, DO  gabapentin (NEURONTIN) 300 MG capsule Take 1 capsule (300 mg total) by mouth 3 (three) times daily. Patient not taking: Reported on 04/28/2021 12/10/19   Shelly Coss, MD  ipratropium (ATROVENT) 0.06 % nasal spray Place 2 sprays into both nostrils 3 (three) times daily. Patient not taking: Reported on 04/28/2021 06/18/20   Valentina Shaggy, MD  loratadine (CLARITIN) 10 MG tablet Take 1 tablet (10 mg total) by mouth daily as needed for allergies. Patient not taking: Reported on 04/28/2021 06/18/20 08/26/20  Valentina Shaggy, MD     Family History  Problem Relation Age of Onset   Allergic rhinitis Neg Hx  Asthma Neg Hx    Eczema Neg Hx    Urticaria Neg Hx    Angioedema Neg Hx    Atopy Neg Hx    Immunodeficiency Neg Hx     Social History   Socioeconomic History   Marital status: Single    Spouse name: Not on file   Number of children: Not on file   Years of education: Not on file   Highest education level: Not on file  Occupational History   Not on file  Tobacco Use   Smoking status: Never   Smokeless tobacco: Never  Vaping Use   Vaping Use: Never used  Substance and Sexual Activity   Alcohol use: No   Drug use: No   Sexual activity: Not on file  Other Topics Concern   Not on file  Social History Narrative   Not on file   Social Determinants of Health   Financial Resource Strain: Not on file  Food Insecurity: Not on file  Transportation Needs: Not on file  Physical Activity: Not on file  Stress: Not on file  Social Connections: Not on file      Review of Systems see above; she currently denies fever,HA,CP,dyspnea, cough, abd pain,N/V or bleeding; she has been constipated;  she also has been treated for rt eye conjuctivitis  Vital Signs: BP 117/77 (BP Location: Left Arm)    Pulse 80    Temp 98.5 F (36.9 C) (Oral)    Resp 16    Ht 5\' 3"  (1.6 m)    Wt 175 lb 14.8 oz (79.8 kg)    SpO2 90%    BMI 31.16 kg/m   Physical Exam awake/alert; chest- CTA bilat ant; heart- RRR; abd- soft,few BS,NT; currently wearing TLSO brace; can move all fours  Imaging: CT ABDOMEN PELVIS WO CONTRAST  Result Date: 04/28/2021 CLINICAL DATA:  Acute back pain. EXAM: CT ABDOMEN AND PELVIS WITHOUT CONTRAST TECHNIQUE: Multidetector CT imaging of the abdomen and pelvis was performed following the standard protocol without IV contrast. RADIATION DOSE REDUCTION: This exam was performed according to the departmental dose-optimization program which includes automated exposure control, adjustment of the mA and/or kV according to patient size and/or use of iterative reconstruction technique. COMPARISON:  March 16, 2012. FINDINGS: Lower chest: No acute abnormality. Hepatobiliary: Status post cholecystectomy. No biliary dilatation is noted. Stable right hepatic cyst. Pancreas: Unremarkable. No pancreatic ductal dilatation or surrounding inflammatory changes. Spleen: Normal in size without focal abnormality. Adrenals/Urinary Tract: Adrenal glands appear normal. Bilateral renal cortical atrophy is noted. No hydronephrosis or renal obstruction is noted. Urinary bladder is unremarkable. No renal or ureteral calculi are noted. Stomach/Bowel: The stomach appears normal. There is no evidence of bowel obstruction or inflammation. Status post appendectomy. Sigmoid diverticulosis is noted without inflammation. Vascular/Lymphatic: Aortic atherosclerosis. No enlarged abdominal or pelvic lymph nodes. Reproductive: Status post hysterectomy. No adnexal masses. Other: No abdominal wall hernia or abnormality. No abdominopelvic ascites. Musculoskeletal: There is interval development of superior endplate depression of I95 vertebral  body concerning for fracture of indeterminate age. IMPRESSION: Sigmoid diverticulosis without inflammation. Interval development of mild superior endplate depression of J88 vertebral body concerning for fracture of indeterminate age. MRI may be performed for further evaluation. No other significant abnormality seen in the abdomen pelvis. Aortic Atherosclerosis (ICD10-I70.0). Electronically Signed   By: Marijo Conception M.D.   On: 04/28/2021 14:00   MR LUMBAR SPINE WO CONTRAST  Result Date: 05/04/2021 CLINICAL DATA:  Severe back pain with T12 compression fracture EXAM:  MRI LUMBAR SPINE WITHOUT CONTRAST TECHNIQUE: Multiplanar, multisequence MR imaging of the lumbar spine was performed. No intravenous contrast was administered. COMPARISON:  Correlation made with the recent CT FINDINGS: Segmentation:  Standard. Alignment:  Minor endplate retropulsion at T12. dextrocurvature. Vertebrae: T12 compression fracture with less than 50% loss of height at the superior endplate. This is similar to the prior study. Marrow edema is present. Lumbar vertebral body heights are maintained. Mildly heterogeneous marrow signal likely reflects decreased mineralization. No suspicious osseous lesion. Conus medullaris and cauda equina: Conus extends to the L1-L2 level. Conus and cauda equina appear normal. Paraspinal and other soft tissues: Unremarkable. Disc levels: L1-L2:  Minimal disc bulge.  No canal or foraminal stenosis. L2-L3: Minimal disc bulge eccentric to the right. No canal or foraminal stenosis. L3-L4:  Minimal disc bulge.  No canal or foraminal stenosis. L4-L5: Minimal disc bulge. Mild facet arthropathy. No canal or foraminal stenosis. L5-S1: Disc space narrowing. Disc bulge with endplate osteophytic ridging. Mild facet arthropathy. No canal or foraminal stenosis. IMPRESSION: Acute to subacute T12 compression fracture with similar less than 50% loss of height and minor endplate retropulsion. No evidence to suggest this is a  pathologic fracture. Mild lumbar spine degenerative changes. Electronically Signed   By: Macy Mis M.D.   On: 05/04/2021 13:41   CT L-SPINE NO CHARGE  Result Date: 04/28/2021 CLINICAL DATA:  Back pain beginning last night after standing up from a couch. EXAM: CT LUMBAR SPINE WITHOUT CONTRAST TECHNIQUE: Multidetector CT imaging of the lumbar spine was performed without intravenous contrast administration. Multiplanar CT image reconstructions were also generated. RADIATION DOSE REDUCTION: This exam was performed according to the departmental dose-optimization program which includes automated exposure control, adjustment of the mA and/or kV according to patient size and/or use of iterative reconstruction technique. COMPARISON:  Lumbar spine CT 11/23/2019 FINDINGS: Segmentation: 5 lumbar type vertebrae. Alignment: Slight right convex curvature of the upper lumbar spine. No significant listhesis. Vertebrae: T12 superior endplate compression fracture with 15% vertebral body height loss, new from 2021 and age-indeterminate but potentially recent. No lumbar spine fracture. No suspicious osseous lesion. Diffuse osteopenia. Paraspinal and other soft tissues: No acute abnormality in the paraspinal soft tissues. Intra-abdominal and pelvic contents reported separately. Disc levels: Moderate disc space narrowing at L5-S1 with preserved disc space heights elsewhere. Mild-to-moderate lower lumbar facet arthrosis. No evidence of high-grade spinal canal or neural foraminal stenosis. IMPRESSION: 1. Mild T12 compression fracture, new from 2021 and of indeterminate acuity but potentially recent. MRI could be performed for further evaluation if clinically indicated. 2. No lumbar spine fracture. Electronically Signed   By: Logan Bores M.D.   On: 04/28/2021 14:16    Labs:  CBC: Recent Labs    04/29/21 1430 05/01/21 0804 05/04/21 0619 05/05/21 0554  WBC 11.3* 10.0 9.3 9.1  HGB 10.8* 10.8* 12.1 11.0*  HCT 35.0* 34.6*  37.8 35.9*  PLT 263 238 307 299    COAGS: No results for input(s): INR, APTT in the last 8760 hours.  BMP: Recent Labs    04/29/21 0111 05/01/21 0804 05/04/21 0619 05/05/21 0554  NA 137 135 131* 131*  K 4.9 4.6 4.2 4.0  CL 106 99 92* 97*  CO2 24 29 30 28   GLUCOSE 100* 91 102* 110*  BUN 15 15 21 22   CALCIUM 8.5* 9.4 9.1 8.8*  CREATININE 1.06* 1.01* 1.17* 1.14*  GFRNONAA 52* 55* 46* 48*    LIVER FUNCTION TESTS: Recent Labs    08/26/20 1623 04/28/21 1130 04/28/21  1800 04/29/21 0111  BILITOT 0.3 0.2*  --  0.5  AST 17 11*  --  19  ALT 12 8  --  11  ALKPHOS 65 52  --  77  PROT 6.2* 4.2*  --  6.3*  ALBUMIN 3.6 2.3* 3.6 3.4*    TUMOR MARKERS: No results for input(s): AFPTM, CEA, CA199, CHROMGRNA in the last 8760 hours.  Assessment and Plan: 81 y.o. female with PMH sig for anxiety, DM,fibromyalgia, PE on eliquis, anemia, vit B12/iron def, HTN, GERD,Grave's disease, HLD,PMR who was admitted to [facility] on 04/28/21 with acute onset of mid to lower back pain after rising up from edge of bed. She lives at an independent living facility, at baseline uses a motorized wheelchair at home but is able to take several steps and transferring to chair and bed/toilet.  She completed a course of antibiotics for UTI recently but continues to have dysuria. The back pain remained constant for 24 hours and did not respond to tramadol or topical cream. The pain is now exacerbated with any movement and does radiate some towards left flank. MRI L spine yesterday revealed:   Acute to subacute T12 compression fracture with similar less than 50% loss of height and minor endplate retropulsion. No evidence to suggest this is a pathologic fracture.   Mild lumbar spine degenerative changes.  She remains on lidoderm patch, neurontin, robaxin, oxycodone and morphine inj prn. Her case was discussed with NS by [facility] and they suggested TLSO, PT/OT, WBAT and f/u in their office in 2 weeks. Request now received from  [facility] to evaluate pt for possible T12 vertebral body augmentation. She is afebrile, WBC 9.1, hgb 11, plts nl, creat 1.14, COVID 19 neg, urine cx with < 10k colonies/insig growth. Imaging studies have been reviewed by Dr. Maryelizabeth Kaufmann.  Per Dr. Maryelizabeth Kaufmann patient appears to be suitable candidate for T12 kyphoplasty.  Details/risks of procedure, including but not limited to, internal bleeding, infection, injury to adjacent structures, inability to eradicate back pain discussed with patient and daughter with their understanding and consent.  Awaiting approval from patient's insurance prior to scheduling case ,however we are tentatively putting on our schedule for 2/27.  Recommend holding Eliquis until after kyphoplasty.      Thank you for this interesting consult.  I greatly enjoyed meeting Christina Fry and look forward to participating in their care.  A copy of this report was sent to the requesting provider on this date.  Electronically Signed: D. Rowe Robert, PA-C 05/05/2021, 2:33 PM   I spent a total of  30 minutes   in face to face in clinical consultation, greater than 50% of which was counseling/coordinating care for T12 kyphoplasty

## 2021-05-05 NOTE — Assessment & Plan Note (Addendum)
Elevated TSH Will increase dose as she reports adherence (increase from 88 to 100 mcg) Follow repeat in 4-6 weeks

## 2021-05-05 NOTE — Progress Notes (Signed)
PROGRESS NOTE    Christina Fry  [id number]:[id number] DOB: 11-10-1940 DOA: 04/28/2021 PCP: Ferdie Ping, MD  Chief Complaint  Patient presents with   Back Pain    Brief Narrative:  Christina Fry is Christina Fry 81 y.o. female with PMH significant for DM2, HLD, Graves' disease, hypothyroidism, polymyalgia rheumatica, fibromyalgia, history of PE, GERD, anxiety who lives at an independent living facility, at baseline uses Galina Haddox motorized wheelchair at home but is able to take several steps and transferring chair and bed/toilet.  She completed Zola Runion course of antibiotics for UTI last week but continued to have dysuria. On 2/14, while getting up from her couch, patient sustained Leontae Bostock sudden onset of sharp back pain from the lower thoracic area to the hip.  It remained constant for 24 hours and did not respond to tramadol or topical cream.  She then presented to the ED on 2/15. In the ED, patient was hemodynamically stable. Labs with potassium low at 3.1, calcium low at 5.9, albumin 2.3, hemoglobin low at 9.2, glucose low at 67 Urinalysis with clear amber color urine positive nitrite. CT abdomen pelvis showed interval development of mild superior endplate depression of Y18 vertebral body concerning for fracture of indeterminate age.  ED physician discussed the case with neurosurgeon Dr. [name].  He recommended TLSO, PT OT, WBAT and Idali Lafever follow-up in the office after 2 weeks. In the ED, she required fentanyl IV for pain control. Admitted to hospitalist service primarily for pain management.    Assessment & Plan:   Principal Problem:   Back pain Active Problems:   T12 compression fracture (HCC)   Hyponatremia   Hypocalcemia   Essential hypertension   Hypokalemia   Hypomagnesemia   Hypothyroidism   History of pulmonary embolus (PE)   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   Iron deficiency anemia   Anemia   Vitamin B12 deficiency   GERD (gastroesophageal reflux disease)   Acute conjunctivitis of right eye   Assessment and  Plan: T12 compression fracture (HCC) Continued poorly controlled pain IR evaluating for kyphoplasty  Scheduled APAP, oxycodone q4 prn, morphine for breakthrough, lidocaine patch  Continue therapy, recommending SNF TLSO brace, EDP discussed with Dr. [name] from neurosurgery on 2/15 and recommended TLSO, WBAT, PT/OT  Hyponatremia Persistent, mild Will continue to monitor  Hypocalcemia Follow albumin  Essential hypertension- (present on admission) Metoprolol  Hypokalemia improved  Hypothyroidism- (present on admission) Elevated TSH Continue synthroid (unclear if she was adherent, will need to reassess)  History of pulmonary embolus (PE) eliquis currently on hold with need for eventual procedure Currently on heparin, will need to discuss timing of her PE?  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 Wheelchair bound, able to transfer Continue PT/OT  Anemia Continue b12, iron supplementation  Acute conjunctivitis of right eye S/p cipro  GERD (gastroesophageal reflux disease)- (present on admission) PPI    DVT prophylaxis: heparin Code Status: dnr Family Communication: daughter over phone Disposition:   Status is: Inpatient Remains inpatient appropriate because: need for IR procedure, continued IV pain meds    Consultants:  IR  Procedures:  none  Antimicrobials:  Anti-infectives (From admission, onward)    Start     Dose/Rate Route Frequency Ordered Stop   04/29/21 1300  acyclovir (ZOVIRAX) 200 MG capsule 200 mg        200 mg Oral 2 times daily 04/29/21 0959         Subjective: C/o pain  Objective: Vitals:   05/04/21 1354 05/04/21 1958 05/05/21 0448 05/05/21 1413  BP: 138/69 Marland Kitchen)  126/59 (!) 141/91 117/77  Pulse: 86 86 75 80  Resp: 19 16 18 16   Temp: 98.8 F (37.1 C) 98 F (36.7 C) 98 F (36.7 C) 98.5 F (36.9 C)  TempSrc: Oral Oral Oral Oral  SpO2: 90% 93% 92% 90%  Weight:      Height:        Intake/Output Summary (Last 24 hours) at 05/05/2021 1830 Last  data filed at 05/05/2021 1129 Gross per 24 hour  Intake 120 ml  Output 600 ml  Net -480 ml   Filed Weights   04/30/21 1055  Weight: 79.8 kg    Examination:  General exam: Appears calm and comfortable  Respiratory system: unlabored Cardiovascular system: RRR Gastrointestinal system: pressing abdomen leads to spasm and back pain Central nervous system: Alert and oriented. No focal neurological deficits. MSK: movement significantly limited due to pain, appears to be trying to hold as still as possible to avoid discomfort Extremities: no LEE    Data Reviewed: I have personally reviewed following labs and imaging studies  CBC: Recent Labs  Lab 04/29/21 1430 05/01/21 0804 05/04/21 0619 05/05/21 0554  WBC 11.3* 10.0 9.3 9.1  NEUTROABS  --   --   --  5.8  HGB 10.8* 10.8* 12.1 11.0*  HCT 35.0* 34.6* 37.8 35.9*  MCV 99.2 97.7 93.8 97.0  PLT 263 238 307 951    Basic Metabolic Panel: Recent Labs  Lab 04/29/21 0111 04/30/21 0523 05/01/21 0804 05/04/21 0619 05/05/21 0554  NA 137  --  135 131* 131*  K 4.9  --  4.6 4.2 4.0  CL 106  --  99 92* 97*  CO2 24  --  29 30 28   GLUCOSE 100*  --  91 102* 110*  BUN 15  --  15 21 22   CREATININE 1.06*  --  1.01* 1.17* 1.14*  CALCIUM 8.5*  --  9.4 9.1 8.8*  MG  --  1.9  --   --   --     GFR: Estimated Creatinine Clearance: 37.4 mL/min (Nichoel Digiulio) (by C-G formula based on SCr of 1.14 mg/dL (H)).  Liver Function Tests: Recent Labs  Lab 04/29/21 0111  AST 19  ALT 11  ALKPHOS 77  BILITOT 0.5  PROT 6.3*  ALBUMIN 3.4*    CBG: No results for input(s): GLUCAP in the last 168 hours.   Recent Results (from the past 240 hour(s))  Urine Culture     Status: Abnormal   Collection Time: 04/28/21 11:34 AM   Specimen: Urine, Clean Catch  Result Value Ref Range Status   Specimen Description   Final    URINE, CLEAN CATCH Performed at [facility], [place] [address], [place], [place] [address]    Special Requests   Final     NONE Performed at [facility], [place] [address], [place], [place] [address]    Culture (Nathanuel Cabreja)  Final    <10,000 COLONIES/mL INSIGNIFICANT GROWTH Performed at [facility] [address] [place], [place] [address]    Report Status 04/29/2021 FINAL  Final  Resp Panel by RT-PCR (Flu Xee Hollman&B, Covid) Nasopharyngeal Swab     Status: None   Collection Time: 04/28/21  3:37 PM   Specimen: Nasopharyngeal Swab; Nasopharyngeal(NP) swabs in vial transport medium  Result Value Ref Range Status   SARS Coronavirus 2 by RT PCR NEGATIVE NEGATIVE Final    Comment: (NOTE) SARS-CoV-2 target nucleic acids are NOT DETECTED.  The SARS-CoV-2 RNA is generally detectable  in upper respiratory specimens during the acute phase of infection. The lowest concentration of SARS-CoV-2 viral copies this assay can detect is 138 copies/mL. Luma Clopper negative result does not preclude SARS-Cov-2 infection and should not be used as the sole basis for treatment or other patient management decisions. Theon Sobotka negative result may occur with  improper specimen collection/handling, submission of specimen other than nasopharyngeal swab, presence of viral mutation(s) within the areas targeted by this assay, and inadequate number of viral copies(<138 copies/mL). Ryli Standlee negative result must be combined with clinical observations, patient history, and epidemiological information. The expected result is Negative.  Fact Sheet for Patients:  EntrepreneurPulse.com.au  Fact Sheet for Healthcare Providers:  IncredibleEmployment.be  This test is no t yet approved or cleared by the [place] FDA and  has been authorized for detection and/or diagnosis of SARS-CoV-2 by FDA under an Emergency Use Authorization (EUA). This EUA will remain  in effect (meaning this test can be used) for the duration of the COVID-19 declaration under Section 564(b)(1) of the Act, 21 U.S.C.section 360bbb-3(b)(1),  unless the authorization is terminated  or revoked sooner.       Influenza Lisamarie Coke by PCR NEGATIVE NEGATIVE Final   Influenza B by PCR NEGATIVE NEGATIVE Final    Comment: (NOTE) The Xpert Xpress SARS-CoV-2/FLU/RSV plus assay is intended as an aid in the diagnosis of influenza from Nasopharyngeal swab specimens and should not be used as Sammy Douthitt sole basis for treatment. Nasal washings and aspirates are unacceptable for Xpert Xpress SARS-CoV-2/FLU/RSV testing.  Fact Sheet for Patients: EntrepreneurPulse.com.au  Fact Sheet for Healthcare Providers: IncredibleEmployment.be  This test is not yet approved or cleared by the [place] FDA and has been authorized for detection and/or diagnosis of SARS-CoV-2 by FDA under an Emergency Use Authorization (EUA). This EUA will remain in effect (meaning this test can be used) for the duration of the COVID-19 declaration under Section 564(b)(1) of the Act, 21 U.S.C. section 360bbb-3(b)(1), unless the authorization is terminated or revoked.  Performed at [facility], [place] [address] [place], [place] [address]          Radiology Studies: MR LUMBAR SPINE WO CONTRAST  Result Date: 05/04/2021 CLINICAL DATA:  Severe back pain with T12 compression fracture EXAM: MRI LUMBAR SPINE WITHOUT CONTRAST TECHNIQUE: Multiplanar, multisequence MR imaging of the lumbar spine was performed. No intravenous contrast was administered. COMPARISON:  Correlation made with the recent CT FINDINGS: Segmentation:  Standard. Alignment:  Minor endplate retropulsion at T12. dextrocurvature. Vertebrae: T12 compression fracture with less than 50% loss of height at the superior endplate. This is similar to the prior study. Marrow edema is present. Lumbar vertebral body heights are maintained. Mildly heterogeneous marrow signal likely reflects decreased mineralization. No suspicious osseous lesion. Conus medullaris and cauda equina:  Conus extends to the L1-L2 level. Conus and cauda equina appear normal. Paraspinal and other soft tissues: Unremarkable. Disc levels: L1-L2:  Minimal disc bulge.  No canal or foraminal stenosis. L2-L3: Minimal disc bulge eccentric to the right. No canal or foraminal stenosis. L3-L4:  Minimal disc bulge.  No canal or foraminal stenosis. L4-L5: Minimal disc bulge. Mild facet arthropathy. No canal or foraminal stenosis. L5-S1: Disc space narrowing. Disc bulge with endplate osteophytic ridging. Mild facet arthropathy. No canal or foraminal stenosis. IMPRESSION: Acute to subacute T12 compression fracture with similar less than 50% loss of height and minor endplate retropulsion. No evidence to suggest this is Edward Guthmiller pathologic fracture. Mild lumbar spine degenerative changes. Electronically Signed   By: Malachi Carl  Patel M.D.   On: 05/04/2021 13:41        Scheduled Meds:  acetaminophen  1,000 mg Oral Q8H   acyclovir  200 mg Oral BID   atorvastatin  20 mg Oral Daily   bisacodyl  10 mg Rectal Daily   calcium carbonate  500 mg Oral BID   calcium-vitamin D  1 tablet Oral Q breakfast   cholecalciferol  1,000 Units Oral Daily   ciprofloxacin  2 drop Right Eye Q4H while awake   feeding supplement  237 mL Oral BID BM   ferrous sulfate  325 mg Oral Q supper   fluticasone  1 spray Each Nare Daily   gabapentin  200 mg Oral BID   levothyroxine  88 mcg Oral QAC breakfast   lidocaine  1 patch Transdermal Q24H   loratadine  10 mg Oral Daily   melatonin  5 mg Oral QHS   methocarbamol  500 mg Oral QID   metoprolol succinate  50 mg Oral Daily   multivitamin with minerals  1 tablet Oral Daily   pantoprazole  40 mg Oral Daily   polyethylene glycol  17 g Oral Daily   senna-docusate  1 tablet Oral BID   Continuous Infusions:  sodium chloride 50 mL/hr at 05/05/21 0937     LOS: 6 days    Time spent: over 30 min    Fayrene Helper, MD [facility]   To contact the attending provider between 7A-7P or the  covering provider during after hours 7P-7A, please log into the web site [email or web address] and access using universal [place] password for that web site. If you do not have the password, please call the hospital operator.  05/05/2021, 6:30 PM

## 2021-05-05 NOTE — Assessment & Plan Note (Signed)
Wheelchair bound, able to transfer Continue PT/OT

## 2021-05-05 NOTE — Assessment & Plan Note (Addendum)
improved Will continue to monitor

## 2021-05-05 NOTE — Assessment & Plan Note (Addendum)
Continued poorly controlled pain S/p kyphoplasty by IR on 2/27 Will plan to discharge with tylenol, oxycodone prn, and Christina Fry lidocaine patch Continue therapy, recommending SNF, apparently will be ready tomorrow TLSO brace, EDP discussed with Dr. [name] from neurosurgery on 2/15 and recommended TLSO brace, WBAT, PT/OT Need to discuss treatment for osteoporosis with PCP

## 2021-05-05 NOTE — Assessment & Plan Note (Addendum)
eliquis has been resumed post procedure

## 2021-05-05 NOTE — Assessment & Plan Note (Signed)
Follow albumin

## 2021-05-06 DIAGNOSIS — M545 Low back pain, unspecified: Secondary | ICD-10-CM | POA: Diagnosis not present

## 2021-05-06 LAB — CBC WITH DIFFERENTIAL/PLATELET
Abs Immature Granulocytes: 0.02 10*3/uL (ref 0.00–0.07)
Basophils Absolute: 0.1 10*3/uL (ref 0.0–0.1)
Basophils Relative: 1 %
Eosinophils Absolute: 0.3 10*3/uL (ref 0.0–0.5)
Eosinophils Relative: 3 %
HCT: 34 % — ABNORMAL LOW (ref 36.0–46.0)
Hemoglobin: 10.6 g/dL — ABNORMAL LOW (ref 12.0–15.0)
Immature Granulocytes: 0 %
Lymphocytes Relative: 22 %
Lymphs Abs: 2 10*3/uL (ref 0.7–4.0)
MCH: 30.5 pg (ref 26.0–34.0)
MCHC: 31.2 g/dL (ref 30.0–36.0)
MCV: 97.7 fL (ref 80.0–100.0)
Monocytes Absolute: 0.9 10*3/uL (ref 0.1–1.0)
Monocytes Relative: 9 %
Neutro Abs: 5.9 10*3/uL (ref 1.7–7.7)
Neutrophils Relative %: 65 %
Platelets: 316 10*3/uL (ref 150–400)
RBC: 3.48 MIL/uL — ABNORMAL LOW (ref 3.87–5.11)
RDW: 14.8 % (ref 11.5–15.5)
WBC: 9.2 10*3/uL (ref 4.0–10.5)
nRBC: 0 % (ref 0.0–0.2)

## 2021-05-06 LAB — COMPREHENSIVE METABOLIC PANEL
ALT: 11 U/L (ref 0–44)
AST: 15 U/L (ref 15–41)
Albumin: 2.8 g/dL — ABNORMAL LOW (ref 3.5–5.0)
Alkaline Phosphatase: 73 U/L (ref 38–126)
Anion gap: 6 (ref 5–15)
BUN: 18 mg/dL (ref 8–23)
CO2: 27 mmol/L (ref 22–32)
Calcium: 8.7 mg/dL — ABNORMAL LOW (ref 8.9–10.3)
Chloride: 99 mmol/L (ref 98–111)
Creatinine, Ser: 0.95 mg/dL (ref 0.44–1.00)
GFR, Estimated: 59 mL/min — ABNORMAL LOW (ref >60)
Glucose, Bld: 99 mg/dL (ref 70–99)
Potassium: 4 mmol/L (ref 3.5–5.1)
Sodium: 132 mmol/L — ABNORMAL LOW (ref 135–145)
Total Bilirubin: 0.3 mg/dL (ref 0.3–1.2)
Total Protein: 6 g/dL — ABNORMAL LOW (ref 6.5–8.1)

## 2021-05-06 LAB — PROTIME-INR
INR: 1 (ref 0.8–1.2)
Prothrombin Time: 13.1 seconds (ref 11.4–15.2)

## 2021-05-06 LAB — PHOSPHORUS: Phosphorus: 3.6 mg/dL (ref 2.5–4.6)

## 2021-05-06 LAB — MAGNESIUM: Magnesium: 1.8 mg/dL (ref 1.7–2.4)

## 2021-05-06 MED ORDER — CALCIUM CARBONATE ANTACID 500 MG PO CHEW
1.0000 | CHEWABLE_TABLET | Freq: Two times a day (BID) | ORAL | Status: DC
  Administered 2021-05-06 – 2021-05-12 (×11): 200 mg via ORAL
  Filled 2021-05-06 (×11): qty 1

## 2021-05-06 MED ORDER — LEVOTHYROXINE SODIUM 100 MCG PO TABS
100.0000 ug | ORAL_TABLET | Freq: Every day | ORAL | Status: DC
  Administered 2021-05-07 – 2021-05-12 (×6): 100 ug via ORAL
  Filled 2021-05-06 (×6): qty 1

## 2021-05-06 NOTE — Progress Notes (Signed)
PROGRESS NOTE    Christina Fry  UYQ:[id number] DOB: [birth year] DOA: 04/28/2021 PCP: Ferdie Ping, MD  Chief Complaint  Patient presents with   Back Pain    Brief Narrative:  Christina Fry is Christina Fry 81 y.o. female with PMH significant for DM2, HLD, Graves' disease, hypothyroidism, polymyalgia rheumatica, fibromyalgia, history of PE, GERD, anxiety who lives at an independent living facility, at baseline uses Christina Fry motorized wheelchair at home but is able to take several steps and transferring chair and bed/toilet.  She completed Christina Fry course of antibiotics for UTI last week but continued to have dysuria. On 2/14, while getting up from her couch, patient sustained Christina Fry sudden onset of sharp back pain from the lower thoracic area to the hip.  It remained constant for 24 hours and did not respond to tramadol or topical cream.  She then presented to the ED on 2/15. In the ED, patient was hemodynamically stable. Labs with potassium low at 3.1, calcium low at 5.9, albumin 2.3, hemoglobin low at 9.2, glucose low at 67 Urinalysis with clear amber color urine positive nitrite. CT abdomen pelvis showed interval development of mild superior endplate depression of G38 vertebral body concerning for fracture of indeterminate age.  ED physician discussed the case with neurosurgeon Dr. [name].  He recommended TLSO, PT OT, WBAT and Jerol Rufener follow-up in the office after 2 weeks. In the ED, she required fentanyl IV for pain control. Admitted to hospitalist service primarily for pain management.    Assessment & Plan:   Principal Problem:   Back pain Active Problems:   T12 compression fracture (HCC)   Hyponatremia   Hypocalcemia   Essential hypertension   Hypokalemia   Hypomagnesemia   Hypothyroidism   History of pulmonary embolus (PE)   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   Iron deficiency anemia   Anemia   Vitamin B12 deficiency   GERD (gastroesophageal reflux disease)   Acute conjunctivitis of right eye   Assessment and  Plan: T12 compression fracture (HCC) Continued poorly controlled pain IR evaluating for kyphoplasty, possibly monday Scheduled APAP, oxycodone q4 prn, morphine for breakthrough, lidocaine patch  Continue therapy, recommending SNF TLSO brace, EDP discussed with Dr. [name] from neurosurgery on 2/15 and recommended TLSO, WBAT, PT/OT  Hyponatremia Persistent, mild Will continue to monitor  Hypocalcemia Follow albumin  Essential hypertension- (present on admission) Metoprolol  Hypokalemia improved  Hypothyroidism- (present on admission) Elevated TSH Will increase dose as she reports adherence (increase from 88 to 100 mcg) Follow repeat in 4-6 weeks  History of pulmonary embolus (PE) eliquis currently on hold with need for eventual procedure Currently on heparin, will need to discuss timing of her PE (she was unclear, but I see hx PE noted in 08/2020 d/c summary, so likely ok to hold for [name] few days - DVT ppx ordered in interim - will discuss with daughter as well if possible)?  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 Wheelchair bound, able to transfer Continue PT/OT  Anemia Continue b12, iron supplementation  Acute conjunctivitis of right eye S/p cipro  GERD (gastroesophageal reflux disease)- (present on admission) PPI    DVT prophylaxis: heparin Code Status: dnr Family Communication: daughter over phone Disposition:   Status is: Inpatient Remains inpatient appropriate because: need for IR procedure, continued IV pain meds    Consultants:  IR  Procedures:  none  Antimicrobials:  Anti-infectives (From admission, onward)    Start     Dose/Rate Route Frequency Ordered Stop   04/29/21 1300  acyclovir (ZOVIRAX) 200 MG capsule  200 mg        200 mg Oral 2 times daily 04/29/21 0959         Subjective: Pain, but improved, tolerable with pain meds Still 10/10  Objective: Vitals:   05/05/21 1413 05/05/21 2122 05/06/21 0553 05/06/21 1252  BP: 117/77 137/67 (!) 161/55 (!)  148/65  Pulse: 80 78 69 80  Resp: 16 14 14 17   Temp: 98.5 F (36.9 C) 98.1 F (36.7 C) 98.1 F (36.7 C) 98.3 F (36.8 C)  TempSrc: Oral Oral Oral Oral  SpO2: 90% 93% 96% 95%  Weight:      Height:        Intake/Output Summary (Last 24 hours) at 05/06/2021 2010 Last data filed at 05/06/2021 1838 Gross per 24 hour  Intake 1892.03 ml  Output 1125 ml  Net 767.03 ml   Filed Weights   04/30/21 1055  Weight: 79.8 kg    Examination:  General: No acute distress. Cardiovascular: RRR Lungs: unlabored Abdomen: she didn't let me examine abdomen Neurological: Alert and oriented 3. Moves all extremities 4. Cranial nerves II through XII grossly intact. Skin: Warm and dry. No rashes or lesions. Extremities: No clubbing or cyanosis. No edema.   Data Reviewed: I have personally reviewed following labs and imaging studies  CBC: Recent Labs  Lab 05/01/21 0804 05/04/21 0619 05/05/21 0554 05/06/21 0745  WBC 10.0 9.3 9.1 9.2  NEUTROABS  --   --  5.8 5.9  HGB 10.8* 12.1 11.0* 10.6*  HCT 34.6* 37.8 35.9* 34.0*  MCV 97.7 93.8 97.0 97.7  PLT 238 307 299 481    Basic Metabolic Panel: Recent Labs  Lab 04/30/21 0523 05/01/21 0804 05/04/21 0619 05/05/21 0554 05/06/21 0745  NA  --  135 131* 131* 132*  K  --  4.6 4.2 4.0 4.0  CL  --  99 92* 97* 99  CO2  --  29 30 28 27   GLUCOSE  --  91 102* 110* 99  BUN  --  15 21 22 18   CREATININE  --  1.01* 1.17* 1.14* 0.95  CALCIUM  --  9.4 9.1 8.8* 8.7*  MG 1.9  --   --   --  1.8  PHOS  --   --   --   --  3.6    GFR: Estimated Creatinine Clearance: 44.9 mL/min (by C-G formula based on SCr of 0.95 mg/dL).  Liver Function Tests: Recent Labs  Lab 05/05/21 0554 05/06/21 0745  AST  --  15  ALT  --  11  ALKPHOS  --  73  BILITOT  --  0.3  PROT  --  6.0*  ALBUMIN 2.8* 2.8*    CBG: No results for input(s): GLUCAP in the last 168 hours.   Recent Results (from the past 240 hour(s))  Urine Culture     Status: Abnormal   Collection  Time: 04/28/21 11:34 AM   Specimen: Urine, Clean Catch  Result Value Ref Range Status   Specimen Description   Final    URINE, CLEAN CATCH Performed at [facility], [place] [address], [place], [place] [address]    Special Requests   Final    NONE Performed at [facility], [place] [address] [place], [place] [address]    Culture (Christina Fry)  Final    <10,000 COLONIES/mL INSIGNIFICANT GROWTH Performed at [place] [address] [place], [place] [address]    Report Status 04/29/2021 FINAL  Final  Resp Panel  by RT-PCR (Flu Christina Fry&B, Covid) Nasopharyngeal Swab     Status: None   Collection Time: 04/28/21  3:37 PM   Specimen: Nasopharyngeal Swab; Nasopharyngeal(NP) swabs in vial transport medium  Result Value Ref Range Status   SARS Coronavirus 2 by RT PCR NEGATIVE NEGATIVE Final    Comment: (NOTE) SARS-CoV-2 target nucleic acids are NOT DETECTED.  The SARS-CoV-2 RNA is generally detectable in upper respiratory specimens during the acute phase of infection. The lowest concentration of SARS-CoV-2 viral copies this assay can detect is 138 copies/mL. Christina Fry negative result does not preclude SARS-Cov-2 infection and should not be used as the sole basis for treatment or other patient management decisions. Christina Fry negative result may occur with  improper specimen collection/handling, submission of specimen other than nasopharyngeal swab, presence of viral mutation(s) within the areas targeted by this assay, and inadequate number of viral copies(<138 copies/mL). Christina Fry negative result must be combined with clinical observations, patient history, and epidemiological information. The expected result is Negative.  Fact Sheet for Patients:  EntrepreneurPulse.com.au  Fact Sheet for Healthcare Providers:  IncredibleEmployment.be  This test is no t yet approved or cleared by the [place] FDA and  has been authorized for detection and/or  diagnosis of SARS-CoV-2 by FDA under an Emergency Use Authorization (EUA). This EUA will remain  in effect (meaning this test can be used) for the duration of the COVID-19 declaration under Section 564(b)(1) of the Act, 21 U.S.C.section 360bbb-3(b)(1), unless the authorization is terminated  or revoked sooner.       Influenza Christina Fry by PCR NEGATIVE NEGATIVE Final   Influenza B by PCR NEGATIVE NEGATIVE Final    Comment: (NOTE) The Xpert Xpress SARS-CoV-2/FLU/RSV plus assay is intended as an aid in the diagnosis of influenza from Nasopharyngeal swab specimens and should not be used as Christina Fry sole basis for treatment. Nasal washings and aspirates are unacceptable for Xpert Xpress SARS-CoV-2/FLU/RSV testing.  Fact Sheet for Patients: EntrepreneurPulse.com.au  Fact Sheet for Healthcare Providers: IncredibleEmployment.be  This test is not yet approved or cleared by the [place] FDA and has been authorized for detection and/or diagnosis of SARS-CoV-2 by FDA under an Emergency Use Authorization (EUA). This EUA will remain in effect (meaning this test can be used) for the duration of the COVID-19 declaration under Section 564(b)(1) of the Act, 21 U.S.C. section 360bbb-3(b)(1), unless the authorization is terminated or revoked.  Performed at [facility], [place] [address], [place], [place] [address]          Radiology Studies: No results found.      Scheduled Meds:  acetaminophen  1,000 mg Oral Q8H   acyclovir  200 mg Oral BID   atorvastatin  20 mg Oral Daily   bisacodyl  10 mg Rectal Daily   calcium carbonate  1 tablet Oral BID   calcium-vitamin D  1 tablet Oral Q breakfast   cholecalciferol  1,000 Units Oral Daily   ciprofloxacin  2 drop Right Eye Q4H while awake   feeding supplement  237 mL Oral BID BM   ferrous sulfate  325 mg Oral Q supper   fluticasone  1 spray Each Nare Daily   gabapentin  200 mg Oral BID    [START ON 05/07/2021] levothyroxine  100 mcg Oral QAC breakfast   lidocaine  1 patch Transdermal Q24H   loratadine  10 mg Oral Daily   melatonin  5 mg Oral QHS   methocarbamol  500 mg Oral QID   metoprolol succinate  50  mg Oral Daily   multivitamin with minerals  1 tablet Oral Daily   pantoprazole  40 mg Oral Daily   polyethylene glycol  17 g Oral Daily   senna-docusate  1 tablet Oral BID   vitamin B-12  1,000 mcg Oral Daily   Continuous Infusions:     LOS: 7 days    Time spent: over 30 min    Fayrene Helper, MD [facility]   To contact the attending provider between 7A-7P or the covering provider during after hours 7P-7A, please log into the web site [email or web address] and access using universal [place] password for that web site. If you do not have the password, please call the hospital operator.  05/06/2021, 8:10 PM

## 2021-05-06 NOTE — Progress Notes (Signed)
Nutrition Follow-up  DOCUMENTATION CODES:   Obesity unspecified  INTERVENTION:   -Ensure Plus High Protein po BID, each supplement provides 350 kcal and 20 grams of protein.   -Multivitamin with minerals daily  NUTRITION DIAGNOSIS:   Increased nutrient needs related to acute illness as evidenced by estimated needs.  Ongoing.  GOAL:   Patient will meet greater than or equal to 90% of their needs  Progressing.  MONITOR:   PO intake, Supplement acceptance, Labs, Weight trends, I & O's, Skin  ASSESSMENT:   81 y.o. female with PMH significant for DM2, HLD, Graves' disease, hypothyroidism, polymyalgia rheumatica, fibromyalgia, history of PE, GERD, anxiety who lives at an independent living facility, at baseline uses a motorized wheelchair at home but is able to take several steps and transferring chair and bed/toilet.  Patient consuming 70-100% of meals. Accepting Ensure supplements as well.  Per IR note, pt tentatively scheduled for T12 vertebral body augmentation with kyphoplasty on 2/27.  Admission weight: 175 lbs No other weights this admission.  Medications: Dulcolax, Tums, OSCAL w/ D, Vitamin D, Ferrous sulfate, Multivitamin with minerals daily, Senokot, Miralax, Vitamin B-12  Labs reviewed: Low Na  Diet Order:   Diet Order             Diet Heart Room service appropriate? Yes; Fluid consistency: Thin  Diet effective now                   EDUCATION NEEDS:   Education needs have been addressed  Skin:  Skin Assessment: Reviewed RN Assessment  Last BM:  2/21 -type 7  Height:   Ht Readings from Last 1 Encounters:  04/30/21 5\' 3"  (1.6 m)    Weight:   Wt Readings from Last 1 Encounters:  04/30/21 79.8 kg    BMI:  Body mass index is 31.16 kg/m.  Estimated Nutritional Needs:   Kcal:  1500-1700  Protein:  65-75g  Fluid:  1.7L/day  Clayton Bibles, MS, RD, LDN Inpatient Clinical Dietitian Contact information available via Amion

## 2021-05-06 NOTE — Progress Notes (Signed)
Occupational Therapy Treatment Patient Details Name: DANAYSHA KIRN MRN: [id number] DOB: [birth year] Today's Date: 05/06/2021   History of present illness TECIA CINNAMON is a 81 y.o. female with PMH significant for DM2, HLD, Graves' disease, hypothyroidism, polymyalgia rheumatica, fibromyalgia, history of PE, GERD, anxiety who lives at an independent living facility, at baseline uses a motorized wheelchair at home but is able to take several steps and transferring chair and bed/toilet.  She completed a course of antibiotics for UTI last week but continued to have dysuria.  On 2/14, while getting up from her couch, patient sustained a sudden onset of sharp back pain from the lower thoracic area to the hip.  It remained constant for 24 hours and did not respond to tramadol or topical cream.  She then presented to the ED on 2/15.  In the ED, patient was hemodynamically stable.  Labs with potassium low at 3.1, calcium low at 5.9, albumin 2.3, hemoglobin low at 9.2, glucose low at 67  Urinalysis with clear amber color urine positive nitrite.  CT abdomen pelvis showed interval development of mild superior endplate depression of Y65 vertebral body concerning for fracture of indeterminate age.   ED physician discussed the case with neurosurgeon Dr. [name].  He recommended TLSO, PT OT, WBAT and a follow-up in the office after 2 weeks.  In the ED, she required fentanyl IV for pain control.  Admitted to hospitalist service primarily for pain management.   OT comments  Patient likes to go at own pace/needing increased time for bed mobility. Patient able to demonstrate log roll technique with supervision for rolling and min A to upright trunk from sidelying. Patient reports mild dizziness and after seated rest needing total A to don TLSO. Patient reports spasm with donning. With cues for safety + sequencing patient min HHA +1 to pivot to recliner chair. Attempted to have patient transfer to [facility] at beginning of session however  she declined wanting to use purewick instead. Continue to encourage OOB activity as able.    Recommendations for follow up therapy are one component of a multi-disciplinary discharge planning process, led by the attending physician.  Recommendations may be updated based on patient status, additional functional criteria and insurance authorization.    Follow Up Recommendations  Skilled nursing-short term rehab (<3 hours/day)    Assistance Recommended at Discharge Frequent or constant Supervision/Assistance  Patient can return home with the following  A lot of help with walking and/or transfers;A lot of help with bathing/dressing/bathroom;Assistance with cooking/housework;Assist for transportation;Help with stairs or ramp for entrance   Equipment Recommendations  None recommended by OT       Precautions / Restrictions Precautions Precautions: Back Required Braces or Orthoses: Spinal Brace Spinal Brace: Thoracolumbosacral orthotic;Applied in sitting position Restrictions Weight Bearing Restrictions: No       Mobility Bed Mobility Overal bed mobility: Needs Assistance Bed Mobility: Rolling, Sidelying to Sit Rolling: Supervision Sidelying to sit: Min assist, HOB elevated       General bed mobility comments: Patient was able to demonstrate log roll technique needing min A to upright trunk to sitting       Balance Overall balance assessment: History of Falls, Needs assistance Sitting-balance support: Feet supported Sitting balance-Leahy Scale: Fair     Standing balance support: Single extremity supported Standing balance-Leahy Scale: Poor Standing balance comment: Reliant on external assistance  ADL either performed or assessed with clinical judgement   ADL Overall ADL's : Needs assistance/impaired                 Upper Body Dressing : Total assistance;Sitting Upper Body Dressing Details (indicate cue type and reason): to don  TLSO     Toilet Transfer: Minimal assistance;Stand-pivot;Cueing for safety;Cueing for sequencing Toilet Transfer Details (indicate cue type and reason): Patient needing min A to power up to standing and HHA to pivot to recliner with min cues for safety   Toileting - Clothing Manipulation Details (indicate cue type and reason): Encourage patient to transfer to bedside commode however patient declined and used purewick to void     Functional mobility during ADLs: Minimal assistance;Cueing for safety;Cueing for sequencing General ADL Comments: Patient with improved mobility per last OT session however still limited OOB activity tolerance and ability to perform self care tasks such as dressing and bathing.      Cognition Arousal/Alertness: Awake/alert Behavior During Therapy: WFL for tasks assessed/performed Overall Cognitive Status: Within Functional Limits for tasks assessed                                                     Pertinent Vitals/ Pain       Pain Assessment Pain Assessment: Faces Faces Pain Scale: Hurts little more Pain Location: back Pain Descriptors / Indicators: Spasm Pain Intervention(s): Monitored during session, Premedicated before session         Frequency  Min 2X/week        Progress Toward Goals  OT Goals(current goals can now be found in the care plan section)  Progress towards OT goals: Progressing toward goals  Acute Rehab OT Goals Patient Stated Goal: less pain OT Goal Formulation: With patient Time For Goal Achievement: 05/13/21 Potential to Achieve Goals: Good ADL Goals Pt Will Perform Lower Body Dressing: with adaptive equipment;sit to/from stand;sitting/lateral leans;with mod assist Pt Will Transfer to Toilet: stand pivot transfer;bedside commode;with supervision Additional ADL Goal #1: Patient will require supervision for bed mobility in preparation for self care tasks. Additional ADL Goal #2: Patient will tolerate  10 minutes of out of bed activity in order to participate in ADL tasks.  Plan Discharge plan remains appropriate       AM-PAC OT "6 Clicks" Daily Activity     Outcome Measure   Help from another person eating meals?: A Little Help from another person taking care of personal grooming?: A Little Help from another person toileting, which includes using toliet, bedpan, or urinal?: A Lot Help from another person bathing (including washing, rinsing, drying)?: A Lot Help from another person to put on and taking off regular upper body clothing?: A Lot Help from another person to put on and taking off regular lower body clothing?: Total 6 Click Score: 13    End of Session Equipment Utilized During Treatment: Back brace  OT Visit Diagnosis: Other abnormalities of gait and mobility (R26.89);Pain Pain - part of body:  (back)   Activity Tolerance Patient tolerated treatment well   Patient Left in chair;with call bell/phone within reach   Nurse Communication Mobility status        Time: 0109-3235 OT Time Calculation (min): 23 min  Charges: OT General Charges $OT Visit: 1 Visit OT Treatments $Self Care/Home Management : 23-37 mins  Delbert Phenix  OT OT pager: [place] 05/06/2021, 10:11 AM

## 2021-05-07 DIAGNOSIS — M545 Low back pain, unspecified: Secondary | ICD-10-CM | POA: Diagnosis not present

## 2021-05-07 LAB — BASIC METABOLIC PANEL
Anion gap: 5 (ref 5–15)
BUN: 16 mg/dL (ref 8–23)
CO2: 27 mmol/L (ref 22–32)
Calcium: 8.7 mg/dL — ABNORMAL LOW (ref 8.9–10.3)
Chloride: 102 mmol/L (ref 98–111)
Creatinine, Ser: 0.98 mg/dL (ref 0.44–1.00)
GFR, Estimated: 57 mL/min — ABNORMAL LOW (ref >60)
Glucose, Bld: 92 mg/dL (ref 70–99)
Potassium: 4 mmol/L (ref 3.5–5.1)
Sodium: 134 mmol/L — ABNORMAL LOW (ref 135–145)

## 2021-05-07 LAB — CBC
HCT: 30.5 % — ABNORMAL LOW (ref 36.0–46.0)
Hemoglobin: 9.6 g/dL — ABNORMAL LOW (ref 12.0–15.0)
MCH: 30.4 pg (ref 26.0–34.0)
MCHC: 31.5 g/dL (ref 30.0–36.0)
MCV: 96.5 fL (ref 80.0–100.0)
Platelets: 314 10*3/uL (ref 150–400)
RBC: 3.16 MIL/uL — ABNORMAL LOW (ref 3.87–5.11)
RDW: 14.6 % (ref 11.5–15.5)
WBC: 8.1 10*3/uL (ref 4.0–10.5)
nRBC: 0 % (ref 0.0–0.2)

## 2021-05-07 MED ORDER — ENOXAPARIN SODIUM 40 MG/0.4ML IJ SOSY
40.0000 mg | PREFILLED_SYRINGE | INTRAMUSCULAR | Status: DC
  Administered 2021-05-07 – 2021-05-08 (×2): 40 mg via SUBCUTANEOUS
  Filled 2021-05-07 (×2): qty 0.4

## 2021-05-07 MED ORDER — OXYCODONE HCL 5 MG PO TABS
15.0000 mg | ORAL_TABLET | ORAL | Status: DC | PRN
Start: 2021-05-07 — End: 2021-05-09
  Administered 2021-05-07: 15 mg via ORAL
  Filled 2021-05-07: qty 3

## 2021-05-07 MED ORDER — OXYCODONE HCL 5 MG PO TABS
10.0000 mg | ORAL_TABLET | ORAL | Status: DC | PRN
  Administered 2021-05-08 (×2): 10 mg via ORAL
  Filled 2021-05-07 (×2): qty 2

## 2021-05-07 MED ORDER — CEFAZOLIN SODIUM-DEXTROSE 2-4 GM/100ML-% IV SOLN
2.0000 g | INTRAVENOUS | Status: AC
  Filled 2021-05-07: qty 100

## 2021-05-07 NOTE — Progress Notes (Signed)
PT Cancellation Note  Patient Details Name: Christina Fry MRN: [id number] DOB: Apr 24, 1940   Cancelled Treatment:  Pt scheduled for a Kyphoplasty Monday, Feb 27th.  Pt agreed to hold off Physical Therapy until after.  Will update LPT.   Rica Koyanagi  PTA Acute  Rehabilitation Services Pager      [phone] Office      [phone]

## 2021-05-07 NOTE — Progress Notes (Signed)
Patient ID: Christina Fry, female   DOB: 1940/06/18, 81 y.o.   MRN: [id number] Pt's insurance has approved KP . Plan is to proceed with case on 2/27. Pt updated. Recommend holding 2/26 and 2/27 doses of lovenox until after KP.

## 2021-05-07 NOTE — Progress Notes (Addendum)
PROGRESS NOTE    Christina Fry  IHK:[id number] DOB: Sep 15, 1940 DOA: 04/28/2021 PCP: Ferdie Ping, MD  Chief Complaint  Patient presents with   Back Pain    Brief Narrative:  Christina Fry is [name] 81 y.o. female with PMH significant for DM2, HLD, Graves' disease, hypothyroidism, polymyalgia rheumatica, fibromyalgia, history of PE, GERD, anxiety who lives at an independent living facility, at baseline uses Samira Acero motorized wheelchair at home but is able to take several steps and transferring chair and bed/toilet.  She completed [name] course of antibiotics for UTI last week but continued to have dysuria. On 2/14, while getting up from her couch, patient sustained Christina Fry sudden onset of sharp back pain from the lower thoracic area to the hip.  It remained constant for 24 hours and did not respond to tramadol or topical cream.  She then presented to the ED on 2/15. In the ED, patient was hemodynamically stable. Labs with potassium low at 3.1, calcium low at 5.9, albumin 2.3, hemoglobin low at 9.2, glucose low at 67 Urinalysis with clear amber color urine positive nitrite. CT abdomen pelvis showed interval development of mild superior endplate depression of V56 vertebral body concerning for fracture of indeterminate age.  ED physician discussed the case with neurosurgeon Dr. [name].  He recommended TLSO, PT OT, WBAT and Omarrion Carmer follow-up in the office after 2 weeks. In the ED, she required fentanyl IV for pain control. Admitted to hospitalist service primarily for pain management.    Assessment & Plan:   Principal Problem:   Back pain Active Problems:   T12 compression fracture (HCC)   Hyponatremia   Hypocalcemia   Essential hypertension   Hypokalemia   Hypomagnesemia   Hypothyroidism   History of pulmonary embolus (PE)   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   Iron deficiency anemia   Anemia   Vitamin B12 deficiency   GERD (gastroesophageal reflux disease)   Acute conjunctivitis of right eye   Assessment and  Plan: T12 compression fracture (HCC) Continued poorly controlled pain IR evaluating for kyphoplasty, possibly monday Scheduled APAP, oxycodone q4 prn, morphine for breakthrough, lidocaine patch - adjusting doses Continue therapy, recommending SNF TLSO brace, EDP discussed with Dr. [name] from neurosurgery on 2/15 and recommended TLSO, WBAT, PT/OT  Hyponatremia Persistent, mild Will continue to monitor  Hypocalcemia Follow albumin  Essential hypertension- (present on admission) Metoprolol  Hypokalemia improved  Hypothyroidism- (present on admission) Elevated TSH Will increase dose as she reports adherence (increase from 88 to 100 mcg) Follow repeat in 4-6 weeks  History of pulmonary embolus (PE) eliquis currently on hold with need for eventual procedure Currently on lovenox, will need to discuss timing of her PE (she was unclear, but I see hx PE noted in 08/2020 d/c summary, so likely ok to hold for Detravion Tester few days - DVT ppx ordered in interim - will discuss with daughter as well if possible)?  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 Wheelchair bound, able to transfer Continue PT/OT  Anemia Continue b12, iron supplementation  Acute conjunctivitis of right eye cipro drops  GERD (gastroesophageal reflux disease)- (present on admission) PPI   DVT prophylaxis: lovenox ordered 2/24, dvt ppx wasn't ordered Wed/Thurs Code Status: dnr Family Communication: none at bedside Disposition:   Status is: Inpatient Remains inpatient appropriate because: need for IR procedure, continued IV pain meds    Consultants:  IR  Procedures:  none  Antimicrobials:  Anti-infectives (From admission, onward)    Start     Dose/Rate Route Frequency Ordered Stop  05/10/21 1230  ceFAZolin (ANCEF) IVPB 2g/100 mL premix        2 g 200 mL/hr over 30 Minutes Intravenous To Radiology 05/07/21 1022 05/11/21 1230   04/29/21 1300  acyclovir (ZOVIRAX) 200 MG capsule 200 mg        200 mg Oral 2 times daily 04/29/21  0959         Subjective: Continued uncontrolled pain, thinks oxycodone can be adjusted  Objective: Vitals:   05/06/21 0553 05/06/21 1252 05/06/21 2044 05/07/21 0553  BP: (!) 161/55 (!) 148/65 119/88 (!) 134/59  Pulse: 69 80 70 71  Resp: 14 17 18 18   Temp: 98.1 F (36.7 C) 98.3 F (36.8 C) 98.4 F (36.9 C) 98.6 F (37 C)  TempSrc: Oral Oral Oral Oral  SpO2: 96% 95% 97% 95%  Weight:      Height:        Intake/Output Summary (Last 24 hours) at 05/07/2021 1215 Last data filed at 05/06/2021 1838 Gross per 24 hour  Intake 714.93 ml  Output 375 ml  Net 339.93 ml   Filed Weights   04/30/21 1055  Weight: 79.8 kg    Examination:  General: No acute distress. Staying very still in bed to avoid pain. Cardiovascular: RRR Lungs: unlabored, CTAB Abdomen: deferred Neurological: Alert and oriented 3. Moves all extremities 4. Cranial nerves II through XII grossly intact. Skin: Warm and dry. No rashes or lesions. Extremities: No clubbing or cyanosis. No edema.  Data Reviewed: I have personally reviewed following labs and imaging studies  CBC: Recent Labs  Lab 05/01/21 0804 05/04/21 0619 05/05/21 0554 05/06/21 0745 05/07/21 0624  WBC 10.0 9.3 9.1 9.2 8.1  NEUTROABS  --   --  5.8 5.9  --   HGB 10.8* 12.1 11.0* 10.6* 9.6*  HCT 34.6* 37.8 35.9* 34.0* 30.5*  MCV 97.7 93.8 97.0 97.7 96.5  PLT 238 307 299 316 300    Basic Metabolic Panel: Recent Labs  Lab 05/01/21 0804 05/04/21 0619 05/05/21 0554 05/06/21 0745 05/07/21 0624  NA 135 131* 131* 132* 134*  K 4.6 4.2 4.0 4.0 4.0  CL 99 92* 97* 99 102  CO2 29 30 28 27 27   GLUCOSE 91 102* 110* 99 92  BUN 15 21 22 18 16   CREATININE 1.01* 1.17* 1.14* 0.95 0.98  CALCIUM 9.4 9.1 8.8* 8.7* 8.7*  MG  --   --   --  1.8  --   PHOS  --   --   --  3.6  --     GFR: Estimated Creatinine Clearance: 43.5 mL/min (by C-G formula based on SCr of 0.98 mg/dL).  Liver Function Tests: Recent Labs  Lab 05/05/21 0554 05/06/21 0745   AST  --  15  ALT  --  11  ALKPHOS  --  73  BILITOT  --  0.3  PROT  --  6.0*  ALBUMIN 2.8* 2.8*    CBG: No results for input(s): GLUCAP in the last 168 hours.   Recent Results (from the past 240 hour(s))  Urine Culture     Status: Abnormal   Collection Time: 04/28/21 11:34 AM   Specimen: Urine, Clean Catch  Result Value Ref Range Status   Specimen Description   Final    URINE, CLEAN CATCH Performed at [facility], [place] [address], [place], [place] [address]    Special Requests   Final    NONE Performed at [facility], [place] [address], [place],  [address]  Culture (Viraat Vanpatten)  Final    <10,000 COLONIES/mL INSIGNIFICANT GROWTH Performed at [place] [address], [place], [place] [address]    Report Status 04/29/2021 FINAL  Final  Resp Panel by RT-PCR (Flu Mashanda Ishibashi&B, Covid) Nasopharyngeal Swab     Status: None   Collection Time: 04/28/21  3:37 PM   Specimen: Nasopharyngeal Swab; Nasopharyngeal(NP) swabs in vial transport medium  Result Value Ref Range Status   SARS Coronavirus 2 by RT PCR NEGATIVE NEGATIVE Final    Comment: (NOTE) SARS-CoV-2 target nucleic acids are NOT DETECTED.  The SARS-CoV-2 RNA is generally detectable in upper respiratory specimens during the acute phase of infection. The lowest concentration of SARS-CoV-2 viral copies this assay can detect is 138 copies/mL. Cesario Weidinger negative result does not preclude SARS-Cov-2 infection and should not be used as the sole basis for treatment or other patient management decisions. Gudrun Axe negative result may occur with  improper specimen collection/handling, submission of specimen other than nasopharyngeal swab, presence of viral mutation(s) within the areas targeted by this assay, and inadequate number of viral copies(<138 copies/mL). Trinity Hyland negative result must be combined with clinical observations, patient history, and epidemiological information. The expected result is  Negative.  Fact Sheet for Patients:  EntrepreneurPulse.com.au  Fact Sheet for Healthcare Providers:  IncredibleEmployment.be  This test is no t yet approved or cleared by the [place] FDA and  has been authorized for detection and/or diagnosis of SARS-CoV-2 by FDA under an Emergency Use Authorization (EUA). This EUA will remain  in effect (meaning this test can be used) for the duration of the COVID-19 declaration under Section 564(b)(1) of the Act, 21 U.S.C.section 360bbb-3(b)(1), unless the authorization is terminated  or revoked sooner.       Influenza Michai Dieppa by PCR NEGATIVE NEGATIVE Final   Influenza B by PCR NEGATIVE NEGATIVE Final    Comment: (NOTE) The Xpert Xpress SARS-CoV-2/FLU/RSV plus assay is intended as an aid in the diagnosis of influenza from Nasopharyngeal swab specimens and should not be used as Chanise Habeck sole basis for treatment. Nasal washings and aspirates are unacceptable for Xpert Xpress SARS-CoV-2/FLU/RSV testing.  Fact Sheet for Patients: EntrepreneurPulse.com.au  Fact Sheet for Healthcare Providers: IncredibleEmployment.be  This test is not yet approved or cleared by the [place] FDA and has been authorized for detection and/or diagnosis of SARS-CoV-2 by FDA under an Emergency Use Authorization (EUA). This EUA will remain in effect (meaning this test can be used) for the duration of the COVID-19 declaration under Section 564(b)(1) of the Act, 21 U.S.C. section 360bbb-3(b)(1), unless the authorization is terminated or revoked.  Performed at [facility], [place] [address] [place], [place] [address]          Radiology Studies: No results found.      Scheduled Meds:  acetaminophen  1,000 mg Oral Q8H   acyclovir  200 mg Oral BID   atorvastatin  20 mg Oral Daily   bisacodyl  10 mg Rectal Daily   calcium carbonate  1 tablet Oral BID   calcium-vitamin D   1 tablet Oral Q breakfast   cholecalciferol  1,000 Units Oral Daily   ciprofloxacin  2 drop Right Eye Q4H while awake   enoxaparin (LOVENOX) injection  40 mg Subcutaneous Q24H   feeding supplement  237 mL Oral BID BM   ferrous sulfate  325 mg Oral Q supper   fluticasone  1 spray Each Nare Daily   gabapentin  200 mg Oral BID   levothyroxine  100 mcg Oral QAC breakfast   lidocaine  1 patch Transdermal Q24H   loratadine  10 mg Oral Daily   melatonin  5 mg Oral QHS   metoprolol succinate  50 mg Oral Daily   multivitamin with minerals  1 tablet Oral Daily   pantoprazole  40 mg Oral Daily   polyethylene glycol  17 g Oral Daily   senna-docusate  1 tablet Oral BID   vitamin B-12  1,000 mcg Oral Daily   Continuous Infusions:  [START ON 05/10/2021]  ceFAZolin (ANCEF) IV        LOS: 8 days    Time spent: over 30 min    Fayrene Helper, MD [facility]   To contact the attending provider between 7A-7P or the covering provider during after hours 7P-7A, please log into the web site [email or web address] and access using universal [place] password for that web site. If you do not have the password, please call the hospital operator.  05/07/2021, 12:15 PM

## 2021-05-08 DIAGNOSIS — M545 Low back pain, unspecified: Secondary | ICD-10-CM | POA: Diagnosis not present

## 2021-05-08 LAB — CBC
HCT: 32.3 % — ABNORMAL LOW (ref 36.0–46.0)
Hemoglobin: 10.2 g/dL — ABNORMAL LOW (ref 12.0–15.0)
MCH: 30.2 pg (ref 26.0–34.0)
MCHC: 31.6 g/dL (ref 30.0–36.0)
MCV: 95.6 fL (ref 80.0–100.0)
Platelets: 354 10*3/uL (ref 150–400)
RBC: 3.38 MIL/uL — ABNORMAL LOW (ref 3.87–5.11)
RDW: 14.6 % (ref 11.5–15.5)
WBC: 8.3 10*3/uL (ref 4.0–10.5)
nRBC: 0 % (ref 0.0–0.2)

## 2021-05-08 LAB — BASIC METABOLIC PANEL
Anion gap: 5 (ref 5–15)
BUN: 13 mg/dL (ref 8–23)
CO2: 28 mmol/L (ref 22–32)
Calcium: 8.7 mg/dL — ABNORMAL LOW (ref 8.9–10.3)
Chloride: 102 mmol/L (ref 98–111)
Creatinine, Ser: 0.99 mg/dL (ref 0.44–1.00)
GFR, Estimated: 57 mL/min — ABNORMAL LOW (ref >60)
Glucose, Bld: 91 mg/dL (ref 70–99)
Potassium: 3.5 mmol/L (ref 3.5–5.1)
Sodium: 135 mmol/L (ref 135–145)

## 2021-05-08 NOTE — Progress Notes (Signed)
Per MAR, patient refused dulcolax suppository on day shift. Patient requested that writer administer the missed suppository since she did not receive earlier. Suppository admin with positive effect.

## 2021-05-08 NOTE — Progress Notes (Signed)
PROGRESS NOTE    Christina Fry  OAC:[id number] DOB: 1940-07-22 DOA: 04/28/2021 PCP: Ferdie Ping, MD  Chief Complaint  Patient presents with   Back Pain    Brief Narrative:  Christina Fry is Christina Fry 81 y.o. female with PMH significant for DM2, HLD, Graves' disease, hypothyroidism, polymyalgia rheumatica, fibromyalgia, history of PE, GERD, anxiety who lives at an independent living facility, at baseline uses Christina Fry motorized wheelchair at home but is able to take several steps and transferring chair and bed/toilet.  She completed Christina Fry course of antibiotics for UTI last week but continued to have dysuria. On 2/14, while getting up from her couch, patient sustained Christina Fry sudden onset of sharp back pain from the lower thoracic area to the hip.  It remained constant for 24 hours and did not respond to tramadol or topical cream.  She then presented to the ED on 2/15. In the ED, patient was hemodynamically stable. Labs with potassium low at 3.1, calcium low at 5.9, albumin 2.3, hemoglobin low at 9.2, glucose low at 67 Urinalysis with clear amber color urine positive nitrite. CT abdomen pelvis showed interval development of mild superior endplate depression of Christina Fry vertebral body concerning for fracture of indeterminate age.  ED physician discussed the case with neurosurgeon Dr. [name].  He recommended TLSO, PT OT, WBAT and Christina Fry follow-up in the office after 2 weeks. In the ED, she required fentanyl IV for pain control. Admitted to hospitalist service primarily for pain management.    Assessment & Plan:   Principal Problem:   Back pain Active Problems:   T12 compression fracture (HCC)   Hyponatremia   Hypocalcemia   Essential hypertension   Hypokalemia   Hypomagnesemia   Hypothyroidism   History of pulmonary embolus (PE)   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   Iron deficiency anemia   Anemia   Vitamin B12 deficiency   GERD (gastroesophageal reflux disease)   Acute conjunctivitis of right eye   Assessment and  Plan: T12 compression fracture (HCC) Continued poorly controlled pain IR evaluating for kyphoplasty, possibly monday Scheduled APAP, oxycodone q4 prn, morphine for breakthrough, lidocaine patch - adjusting doses Continue therapy, recommending SNF TLSO brace, EDP discussed with Dr. [name] from neurosurgery on 2/15 and recommended TLSO, WBAT, PT/OT  Hyponatremia improved Will continue to monitor  Hypocalcemia Follow albumin  Essential hypertension- (present on admission) Metoprolol  Hypokalemia improved  Hypothyroidism- (present on admission) Elevated TSH Will increase dose as she reports adherence (increase from 88 to 100 mcg) Follow repeat in 4-6 weeks  History of pulmonary embolus (PE) eliquis currently on hold with need for eventual procedure Currently on lovenox, will need to discuss timing of her PE (she was unclear, but I see hx PE noted in 08/2020 d/c summary, so likely ok to hold for Christina Fry few days - DVT ppx ordered in interim - will discuss with daughter as well if possible)?  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 Wheelchair bound, able to transfer Continue PT/OT  Anemia Continue b12, iron supplementation  Acute conjunctivitis of right eye cipro drops  GERD (gastroesophageal reflux disease)- (present on admission) PPI   DVT prophylaxis: lovenox ordered 2/24, dvt ppx wasn't ordered Wed/Thurs Code Status: dnr Family Communication: none at bedside Disposition:   Status is: Inpatient Remains inpatient appropriate because: need for IR procedure, continued IV pain meds    Consultants:  IR  Procedures:  none  Antimicrobials:  Anti-infectives (From admission, onward)    Start     Dose/Rate Route Frequency Ordered Stop   05/10/21  1230  ceFAZolin (ANCEF) IVPB 2g/100 mL premix        2 g 200 mL/hr over 30 Minutes Intravenous To Radiology 05/07/21 1022 05/11/21 1230   04/29/21 1300  acyclovir (ZOVIRAX) 200 MG capsule 200 mg        200 mg Oral 2 times daily 04/29/21 0959          Subjective: Pain better with higher oxy dose Still persistent  Objective: Vitals:   05/07/21 1956 05/08/21 0342 05/08/21 0900 05/08/21 1317  BP: 137/65 (!) 156/60 (!) 149/63 (!) 170/56  Pulse: 83 86 78 83  Resp: 18 18 17 16   Temp: 99.1 F (37.3 C) 98.6 F (37 C) 99.1 F (37.3 C) 98.6 F (37 C)  TempSrc: Oral Oral Oral Oral  SpO2: 95% 97% 92% 94%  Weight:      Height:        Intake/Output Summary (Last 24 hours) at 05/08/2021 1329 Last data filed at 05/07/2021 1900 Gross per 24 hour  Intake --  Output 1100 ml  Net -1100 ml   Filed Weights   04/30/21 1055  Weight: 79.8 kg    Examination:  General: No acute distress. Cardiovascular: RRR Lungs: unlabored Neurological: Alert and oriented 3. Moves all extremities 4. Cranial nerves II through XII grossly intact. Skin: Warm and dry. No rashes or lesions. Extremities: No clubbing or cyanosis. No edema   Data Reviewed: I have personally reviewed following labs and imaging studies  CBC: Recent Labs  Lab 05/04/21 0619 05/05/21 0554 05/06/21 0745 05/07/21 0624 05/08/21 0616  WBC 9.3 9.1 9.2 8.1 8.3  NEUTROABS  --  5.8 5.9  --   --   HGB 12.1 11.0* 10.6* 9.6* 10.2*  HCT 37.8 35.9* 34.0* 30.5* 32.3*  MCV 93.8 97.0 97.7 96.5 95.6  PLT 307 299 316 314 681    Basic Metabolic Panel: Recent Labs  Lab 05/04/21 0619 05/05/21 0554 05/06/21 0745 05/07/21 0624 05/08/21 0616  NA 131* 131* 132* 134* 135  K 4.2 4.0 4.0 4.0 3.5  CL 92* 97* 99 102 102  CO2 30 28 27 27 28   GLUCOSE 102* 110* 99 92 91  BUN 21 22 18 16 13   CREATININE 1.17* 1.14* 0.95 0.98 0.99  CALCIUM 9.1 8.8* 8.7* 8.7* 8.7*  MG  --   --  1.8  --   --   PHOS  --   --  3.6  --   --     GFR: Estimated Creatinine Clearance: 43.1 mL/min (by C-G formula based on SCr of 0.99 mg/dL).  Liver Function Tests: Recent Labs  Lab 05/05/21 0554 05/06/21 0745  AST  --  15  ALT  --  11  ALKPHOS  --  73  BILITOT  --  0.3  PROT  --  6.0*  ALBUMIN  2.8* 2.8*    CBG: No results for input(s): GLUCAP in the last 168 hours.   Recent Results (from the past 240 hour(s))  Resp Panel by RT-PCR (Flu Janziel Hockett&B, Covid) Nasopharyngeal Swab     Status: None   Collection Time: 04/28/21  3:37 PM   Specimen: Nasopharyngeal Swab; Nasopharyngeal(NP) swabs in vial transport medium  Result Value Ref Range Status   SARS Coronavirus 2 by RT PCR NEGATIVE NEGATIVE Final    Comment: (NOTE) SARS-CoV-2 target nucleic acids are NOT DETECTED.  The SARS-CoV-2 RNA is generally detectable in upper respiratory specimens during the acute phase of infection. The lowest concentration of SARS-CoV-2 viral copies this assay can  detect is 138 copies/mL. Christina Fry negative result does not preclude SARS-Cov-2 infection and should not be used as the sole basis for treatment or other patient management decisions. Christina Fry negative result may occur with  improper specimen collection/handling, submission of specimen other than nasopharyngeal swab, presence of viral mutation(s) within the areas targeted by this assay, and inadequate number of viral copies(<138 copies/mL). Christina Fry negative result must be combined with clinical observations, patient history, and epidemiological information. The expected result is Negative.  Fact Sheet for Patients:  EntrepreneurPulse.com.au  Fact Sheet for Healthcare Providers:  IncredibleEmployment.be  This test is no t yet approved or cleared by the [place] FDA and  has been authorized for detection and/or diagnosis of SARS-CoV-2 by FDA under an Emergency Use Authorization (EUA). This EUA will remain  in effect (meaning this test can be used) for the duration of the COVID-19 declaration under Section 564(b)(1) of the Act, 21 U.S.C.section 360bbb-3(b)(1), unless the authorization is terminated  or revoked sooner.       Influenza Christina Fry by PCR NEGATIVE NEGATIVE Final   Influenza B by PCR NEGATIVE NEGATIVE Final     Comment: (NOTE) The Xpert Xpress SARS-CoV-2/FLU/RSV plus assay is intended as an aid in the diagnosis of influenza from Nasopharyngeal swab specimens and should not be used as Celicia Minahan sole basis for treatment. Nasal washings and aspirates are unacceptable for Xpert Xpress SARS-CoV-2/FLU/RSV testing.  Fact Sheet for Patients: EntrepreneurPulse.com.au  Fact Sheet for Healthcare Providers: IncredibleEmployment.be  This test is not yet approved or cleared by the [place] FDA and has been authorized for detection and/or diagnosis of SARS-CoV-2 by FDA under an Emergency Use Authorization (EUA). This EUA will remain in effect (meaning this test can be used) for the duration of the COVID-19 declaration under Section 564(b)(1) of the Act, 21 U.S.C. section 360bbb-3(b)(1), unless the authorization is terminated or revoked.  Performed at [facility], [place] [address], [place], [place] [address]          Radiology Studies: No results found.      Scheduled Meds:  acyclovir  200 mg Oral BID   atorvastatin  20 mg Oral Daily   bisacodyl  10 mg Rectal Daily   calcium carbonate  1 tablet Oral BID   calcium-vitamin D  1 tablet Oral Q breakfast   cholecalciferol  1,000 Units Oral Daily   ciprofloxacin  2 drop Right Eye Q4H while awake   enoxaparin (LOVENOX) injection  40 mg Subcutaneous Q24H   feeding supplement  237 mL Oral BID BM   ferrous sulfate  325 mg Oral Q supper   fluticasone  1 spray Each Nare Daily   gabapentin  200 mg Oral BID   levothyroxine  100 mcg Oral QAC breakfast   lidocaine  1 patch Transdermal Q24H   loratadine  10 mg Oral Daily   melatonin  5 mg Oral QHS   metoprolol succinate  50 mg Oral Daily   multivitamin with minerals  1 tablet Oral Daily   pantoprazole  40 mg Oral Daily   polyethylene glycol  17 g Oral Daily   senna-docusate  1 tablet Oral BID   vitamin B-12  1,000 mcg Oral Daily   Continuous  Infusions:  [START ON 05/10/2021]  ceFAZolin (ANCEF) IV        LOS: 9 days    Time spent: over 30 min    Fayrene Helper, MD [facility]   To contact the attending provider between 7A-7P or the covering  provider during after hours 7P-7A, please log into the web site [email or web address] and access using universal  password for that web site. If you do not have the password, please call the hospital operator.  05/08/2021, 1:29 PM

## 2021-05-09 DIAGNOSIS — K5900 Constipation, unspecified: Secondary | ICD-10-CM

## 2021-05-09 DIAGNOSIS — M545 Low back pain, unspecified: Secondary | ICD-10-CM | POA: Diagnosis not present

## 2021-05-09 LAB — CBC WITH DIFFERENTIAL/PLATELET
Abs Immature Granulocytes: 0.05 10*3/uL (ref 0.00–0.07)
Basophils Absolute: 0.1 10*3/uL (ref 0.0–0.1)
Basophils Relative: 1 %
Eosinophils Absolute: 0.2 10*3/uL (ref 0.0–0.5)
Eosinophils Relative: 2 %
HCT: 35.2 % — ABNORMAL LOW (ref 36.0–46.0)
Hemoglobin: 10.8 g/dL — ABNORMAL LOW (ref 12.0–15.0)
Immature Granulocytes: 1 %
Lymphocytes Relative: 21 %
Lymphs Abs: 1.9 10*3/uL (ref 0.7–4.0)
MCH: 29.9 pg (ref 26.0–34.0)
MCHC: 30.7 g/dL (ref 30.0–36.0)
MCV: 97.5 fL (ref 80.0–100.0)
Monocytes Absolute: 0.9 10*3/uL (ref 0.1–1.0)
Monocytes Relative: 10 %
Neutro Abs: 5.9 10*3/uL (ref 1.7–7.7)
Neutrophils Relative %: 65 %
Platelets: 393 10*3/uL (ref 150–400)
RBC: 3.61 MIL/uL — ABNORMAL LOW (ref 3.87–5.11)
RDW: 14.8 % (ref 11.5–15.5)
WBC: 8.9 10*3/uL (ref 4.0–10.5)
nRBC: 0 % (ref 0.0–0.2)

## 2021-05-09 LAB — MAGNESIUM: Magnesium: 1.8 mg/dL (ref 1.7–2.4)

## 2021-05-09 LAB — COMPREHENSIVE METABOLIC PANEL
ALT: 13 U/L (ref 0–44)
AST: 17 U/L (ref 15–41)
Albumin: 3 g/dL — ABNORMAL LOW (ref 3.5–5.0)
Alkaline Phosphatase: 95 U/L (ref 38–126)
Anion gap: 9 (ref 5–15)
BUN: 12 mg/dL (ref 8–23)
CO2: 28 mmol/L (ref 22–32)
Calcium: 9 mg/dL (ref 8.9–10.3)
Chloride: 99 mmol/L (ref 98–111)
Creatinine, Ser: 1.11 mg/dL — ABNORMAL HIGH (ref 0.44–1.00)
GFR, Estimated: 49 mL/min — ABNORMAL LOW (ref >60)
Glucose, Bld: 127 mg/dL — ABNORMAL HIGH (ref 70–99)
Potassium: 3.7 mmol/L (ref 3.5–5.1)
Sodium: 136 mmol/L (ref 135–145)
Total Bilirubin: 0.3 mg/dL (ref 0.3–1.2)
Total Protein: 6.2 g/dL — ABNORMAL LOW (ref 6.5–8.1)

## 2021-05-09 LAB — PHOSPHORUS: Phosphorus: 3.3 mg/dL (ref 2.5–4.6)

## 2021-05-09 MED ORDER — OXYCODONE HCL 5 MG PO TABS
10.0000 mg | ORAL_TABLET | ORAL | Status: DC | PRN
  Administered 2021-05-09 – 2021-05-12 (×9): 10 mg via ORAL
  Filled 2021-05-09 (×9): qty 2

## 2021-05-09 MED ORDER — SORBITOL 70 % SOLN
960.0000 mL | TOPICAL_OIL | Freq: Once | ORAL | Status: DC
  Filled 2021-05-09: qty 473

## 2021-05-09 MED ORDER — POLYETHYLENE GLYCOL 3350 17 G PO PACK
17.0000 g | PACK | Freq: Two times a day (BID) | ORAL | Status: DC
  Administered 2021-05-09 (×2): 17 g via ORAL
  Filled 2021-05-09 (×5): qty 1

## 2021-05-09 NOTE — Progress Notes (Signed)
PROGRESS NOTE    Christina Fry  EYC:[id number] DOB: 03-11-1941 DOA: 04/28/2021 PCP: Ferdie Ping, MD  Chief Complaint  Patient presents with   Back Pain    Brief Narrative:  Christina Fry is Christina Fry 81 y.o. female with PMH significant for DM2, HLD, Graves' disease, hypothyroidism, polymyalgia rheumatica, fibromyalgia, history of PE, GERD, anxiety who lives at an independent living facility, at baseline uses Germaine Shenker motorized wheelchair at home but is able to take several steps and transferring chair and bed/toilet.  She completed Cecila Satcher course of antibiotics for UTI last week but continued to have dysuria. On 2/14, while getting up from her couch, patient sustained Amro Winebarger sudden onset of sharp back pain from the lower thoracic area to the hip.  It remained constant for 24 hours and did not respond to tramadol or topical cream.  She then presented to the ED on 2/15. In the ED, patient was hemodynamically stable. Labs with potassium low at 3.1, calcium low at 5.9, albumin 2.3, hemoglobin low at 9.2, glucose low at 67 Urinalysis with clear amber color urine positive nitrite. CT abdomen pelvis showed interval development of mild superior endplate depression of J49 vertebral body concerning for fracture of indeterminate age.  ED physician discussed the case with neurosurgeon Dr. [name].  He recommended TLSO, PT OT, WBAT and Norlan Rann follow-up in the office after 2 weeks. In the ED, she required fentanyl IV for pain control. Admitted to hospitalist service primarily for pain management.    Assessment & Plan:   Principal Problem:   Back pain Active Problems:   T12 compression fracture (HCC)   Hyponatremia   Hypocalcemia   Essential hypertension   Hypokalemia   Hypomagnesemia   Hypothyroidism   History of pulmonary embolus (PE)   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   Iron deficiency anemia   Anemia   Vitamin B12 deficiency   GERD (gastroesophageal reflux disease)   Acute conjunctivitis of right eye    Constipation   Assessment and Plan: T12 compression fracture (HCC) Continued poorly controlled pain IR evaluating for kyphoplasty, Monday (hold lovenox today and tomorrow) Scheduled APAP, oxycodone q4 prn, morphine for breakthrough, lidocaine patch - adjusting doses Continue therapy, recommending SNF TLSO brace, EDP discussed with Dr. [name] from neurosurgery on 2/15 and recommended TLSO, WBAT, PT/OT  Hyponatremia improved Will continue to monitor  Hypocalcemia Follow albumin  Essential hypertension- (present on admission) Metoprolol  Hypokalemia improved  Hypothyroidism- (present on admission) Elevated TSH Will increase dose as she reports adherence (increase from 88 to 100 mcg) Follow repeat in 4-6 weeks  History of pulmonary embolus (PE) eliquis currently on hold with need for eventual procedure Currently on lovenox, will need to discuss timing of her PE (she was unclear, but I see hx PE noted in 08/2020 d/c summary, so likely ok to hold for Melrose Kearse few days)  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 Wheelchair bound, able to transfer Continue PT/OT  Anemia Continue b12, iron supplementation  Constipation Requesting enema, will order smog Continue bowel regimen  Acute conjunctivitis of right eye cipro drops  GERD (gastroesophageal reflux disease)- (present on admission) PPI   DVT prophylaxis: lovenox ordered 2/24, dvt ppx wasn't ordered Wed/Thurs Code Status: dnr Family Communication: none at bedside Disposition:   Status is: Inpatient Remains inpatient appropriate because: need for IR procedure, continued IV pain meds    Consultants:  IR  Procedures:  none  Antimicrobials:  Anti-infectives (From admission, onward)    Start     Dose/Rate Route Frequency Ordered Stop  05/10/21 1230  ceFAZolin (ANCEF) IVPB 2g/100 mL premix        2 g 200 mL/hr over 30 Minutes Intravenous To Radiology 05/07/21 1022 05/11/21 1230   04/29/21 1300  acyclovir (ZOVIRAX) 200 MG capsule 200  mg        200 mg Oral 2 times daily 04/29/21 0959         Subjective: Asking for enema  Objective: Vitals:   05/08/21 0900 05/08/21 1317 05/08/21 2048 05/09/21 0603  BP: (!) 149/63 (!) 170/56 140/63 137/71  Pulse: 78 83 83 88  Resp: 17 16 17 16   Temp: 99.1 F (37.3 C) 98.6 F (37 C) 99.2 F (37.3 C) 99.3 F (37.4 C)  TempSrc: Oral Oral Oral Oral  SpO2: 92% 94% 91% 91%  Weight:      Height:        Intake/Output Summary (Last 24 hours) at 05/09/2021 1100 Last data filed at 05/08/2021 1856 Gross per 24 hour  Intake --  Output 450 ml  Net -450 ml   Filed Weights   04/30/21 1055  Weight: 79.8 kg    Examination:  General: No acute distress. Cardiovascular: RRR Lungs: unlabored Abdomen: Soft, nontender, nondistended  Neurological: Alert and oriented 3. Moves all extremities 4 . Cranial nerves II through XII grossly intact. Skin: Warm and dry. No rashes or lesions. Extremities: No clubbing or cyanosis. No edema.   Data Reviewed: I have personally reviewed following labs and imaging studies  CBC: Recent Labs  Lab 05/05/21 0554 05/06/21 0745 05/07/21 0624 05/08/21 0616 05/09/21 0642  WBC 9.1 9.2 8.1 8.3 8.9  NEUTROABS 5.8 5.9  --   --  5.9  HGB 11.0* 10.6* 9.6* 10.2* 10.8*  HCT 35.9* 34.0* 30.5* 32.3* 35.2*  MCV 97.0 97.7 96.5 95.6 97.5  PLT 299 316 314 354 892    Basic Metabolic Panel: Recent Labs  Lab 05/05/21 0554 05/06/21 0745 05/07/21 0624 05/08/21 0616 05/09/21 0642  NA 131* 132* 134* 135 136  K 4.0 4.0 4.0 3.5 3.7  CL 97* 99 102 102 99  CO2 28 27 27 28 28   GLUCOSE 110* 99 92 91 127*  BUN 22 18 16 13 12   CREATININE 1.14* 0.95 0.98 0.99 1.11*  CALCIUM 8.8* 8.7* 8.7* 8.7* 9.0  MG  --  1.8  --   --  1.8  PHOS  --  3.6  --   --  3.3    GFR: Estimated Creatinine Clearance: 38.4 mL/min (Arash Karstens) (by C-G formula based on SCr of 1.11 mg/dL (H)).  Liver Function Tests: Recent Labs  Lab 05/05/21 0554 05/06/21 0745 05/09/21 0642  AST  --  15  17  ALT  --  11 13  ALKPHOS  --  73 95  BILITOT  --  0.3 0.3  PROT  --  6.0* 6.2*  ALBUMIN 2.8* 2.8* 3.0*    CBG: No results for input(s): GLUCAP in the last 168 hours.   No results found for this or any previous visit (from the past 240 hour(s)).        Radiology Studies: No results found.      Scheduled Meds:  acyclovir  200 mg Oral BID   atorvastatin  20 mg Oral Daily   bisacodyl  10 mg Rectal Daily   calcium carbonate  1 tablet Oral BID   calcium-vitamin D  1 tablet Oral Q breakfast   cholecalciferol  1,000 Units Oral Daily   ciprofloxacin  2 drop Right Eye Q4H while  awake   enoxaparin (LOVENOX) injection  40 mg Subcutaneous Q24H   feeding supplement  237 mL Oral BID BM   ferrous sulfate  325 mg Oral Q supper   fluticasone  1 spray Each Nare Daily   gabapentin  200 mg Oral BID   levothyroxine  100 mcg Oral QAC breakfast   lidocaine  1 patch Transdermal Q24H   loratadine  10 mg Oral Daily   melatonin  5 mg Oral QHS   metoprolol succinate  50 mg Oral Daily   multivitamin with minerals  1 tablet Oral Daily   pantoprazole  40 mg Oral Daily   polyethylene glycol  17 g Oral Daily   senna-docusate  1 tablet Oral BID   sorbitol, milk of mag, mineral oil, glycerin (SMOG) enema  960 mL Rectal Once   vitamin B-12  1,000 mcg Oral Daily   Continuous Infusions:  [START ON 05/10/2021]  ceFAZolin (ANCEF) IV        LOS: 10 days    Time spent: over 30 min    Fayrene Helper, MD [facility]   To contact the attending provider between 7A-7P or the covering provider during after hours 7P-7A, please log into the web site [email or web address] and access using universal [place] password for that web site. If you do not have the password, please call the hospital operator.  05/09/2021, 11:00 AM

## 2021-05-09 NOTE — Assessment & Plan Note (Addendum)
She's allergic to milk of mag, soap sud enema ordered prn Continue bowel regimen

## 2021-05-09 NOTE — Care Plan (Signed)
Patient refused enema, states "I'm allergic to Milk of Mag"  Ethlyn Daniels, RN, BSN

## 2021-05-09 NOTE — Progress Notes (Signed)
OT Cancellation Note  Patient Details Name: Christina Fry MRN: [id number] DOB: Aug 02, 1940   Cancelled Treatment:    Reason Eval/Treat Not Completed: Other (comment). Patient wanting to hold on therapy until after kyphoplasty.  Fergus Throne L Clarion Mooneyhan 05/09/2021, 7:38 AM

## 2021-05-10 ENCOUNTER — Inpatient Hospital Stay (HOSPITAL_COMMUNITY): Payer: No Typology Code available for payment source

## 2021-05-10 DIAGNOSIS — M545 Low back pain, unspecified: Secondary | ICD-10-CM | POA: Diagnosis not present

## 2021-05-10 HISTORY — PX: IR KYPHO THORACIC WITH BONE BIOPSY: IMG5518

## 2021-05-10 LAB — BASIC METABOLIC PANEL
Anion gap: 5 (ref 5–15)
BUN: 12 mg/dL (ref 8–23)
CO2: 30 mmol/L (ref 22–32)
Calcium: 9 mg/dL (ref 8.9–10.3)
Chloride: 101 mmol/L (ref 98–111)
Creatinine, Ser: 1.05 mg/dL — ABNORMAL HIGH (ref 0.44–1.00)
GFR, Estimated: 53 mL/min — ABNORMAL LOW (ref >60)
Glucose, Bld: 96 mg/dL (ref 70–99)
Potassium: 4.1 mmol/L (ref 3.5–5.1)
Sodium: 136 mmol/L (ref 135–145)

## 2021-05-10 LAB — CBC
HCT: 34.7 % — ABNORMAL LOW (ref 36.0–46.0)
Hemoglobin: 10.6 g/dL — ABNORMAL LOW (ref 12.0–15.0)
MCH: 29.7 pg (ref 26.0–34.0)
MCHC: 30.5 g/dL (ref 30.0–36.0)
MCV: 97.2 fL (ref 80.0–100.0)
Platelets: 388 10*3/uL (ref 150–400)
RBC: 3.57 MIL/uL — ABNORMAL LOW (ref 3.87–5.11)
RDW: 14.8 % (ref 11.5–15.5)
WBC: 8.2 10*3/uL (ref 4.0–10.5)
nRBC: 0 % (ref 0.0–0.2)

## 2021-05-10 IMAGING — XA IR KYPHO VERTEBRAL THORACIC AUGMENTATION
2 series · 13 of 24 positions shown · non-contrast
Comparison: MR ERXLEBEN, [DATE].

CLINICAL DATA: Osteoporotic T12 vertebral body fracture.
Symptomatic, failed medical management.

EXAM:
VERTEBRAL AUGMENTATION WITH BALLOON KYPHOPLASTY OF T12 VERTEBRAL
BODY

[Series 1: ir kypho thoracic with bone biopsy · 2 of 3 slices shown (1 of 2)]
[im 1/3]
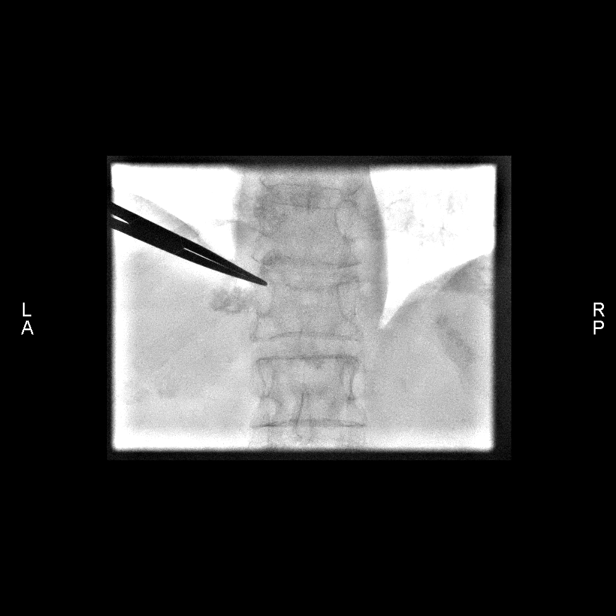
[im 3/3]
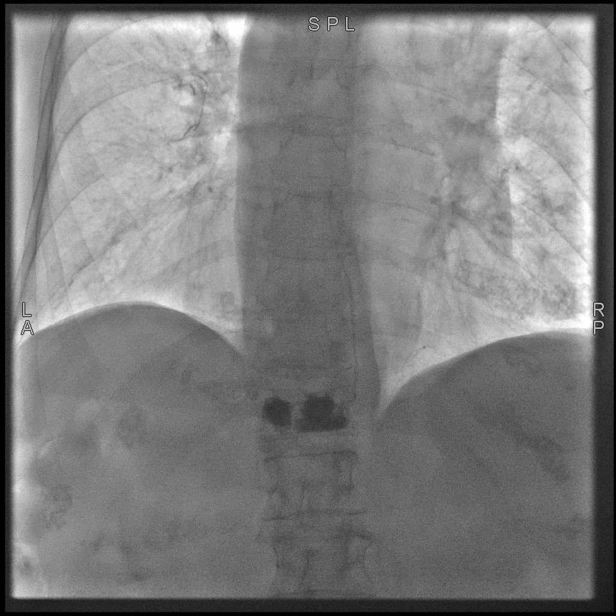

[Series 1: ir kypho thoracic with bone biopsy · 11 of 23 slices shown (2 of 2)]
[im 2/23]
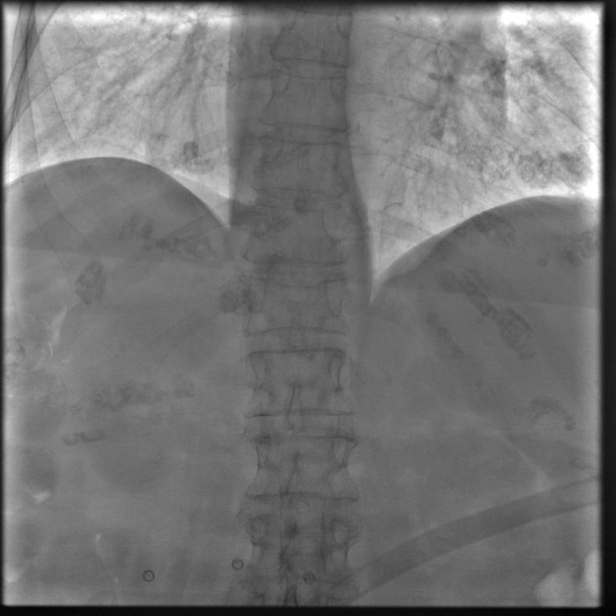
[im 4/23]
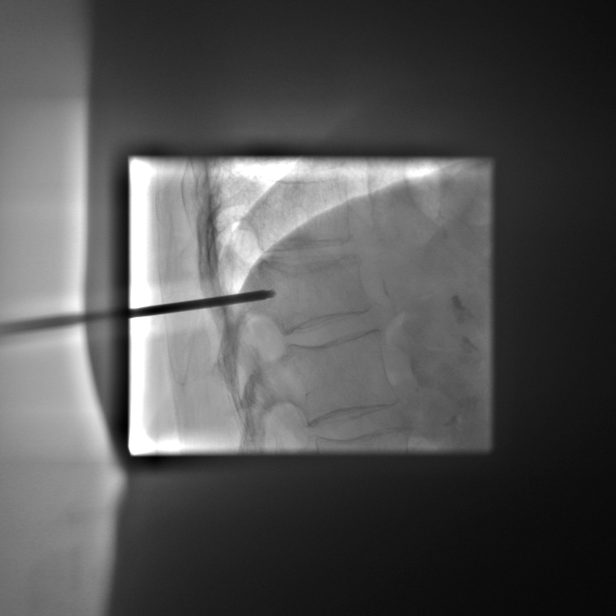
[im 6/23]
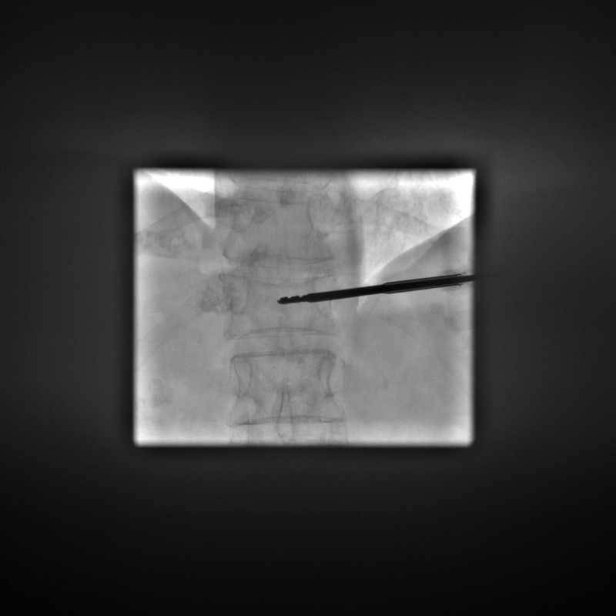
[im 8/23]
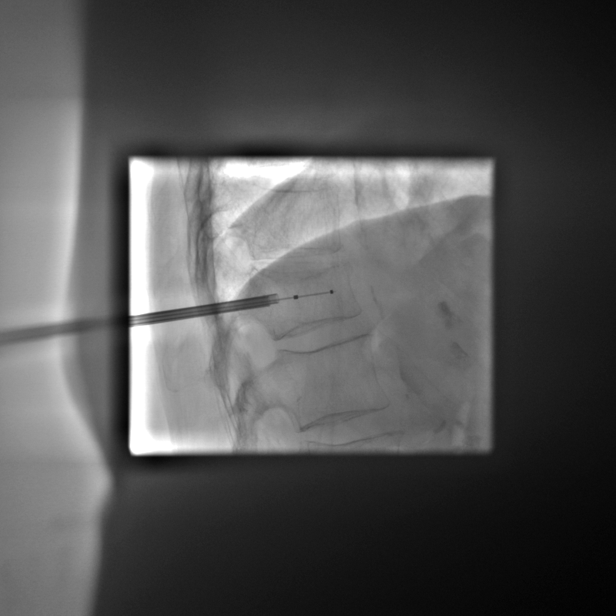
[im 10/23]
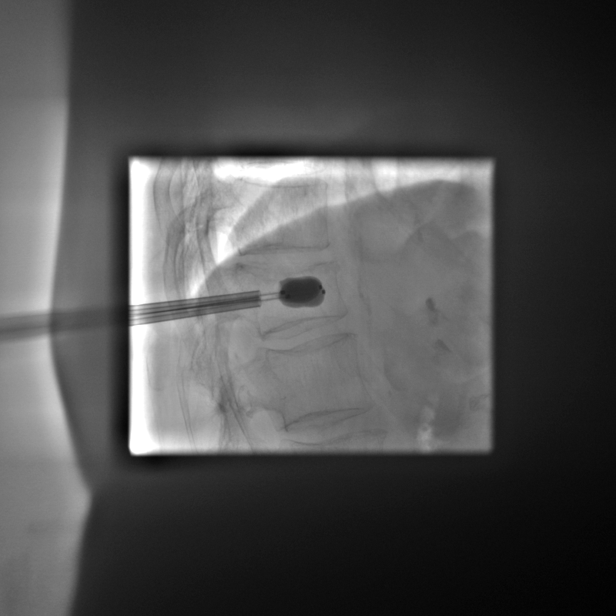
[im 12/23]
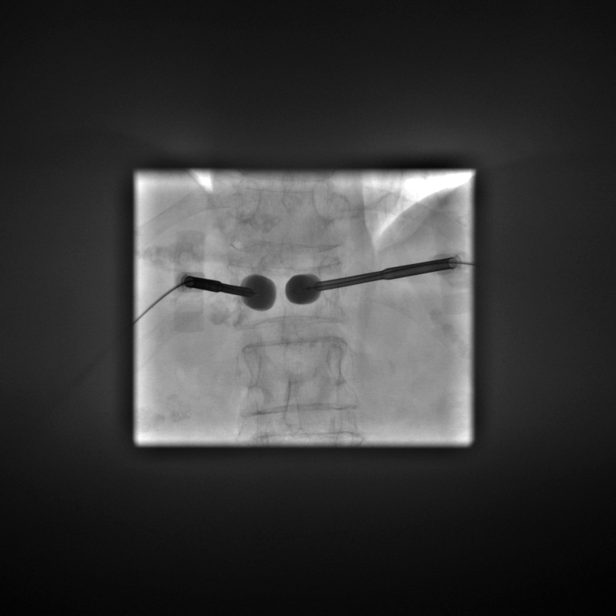
[im 14/23]
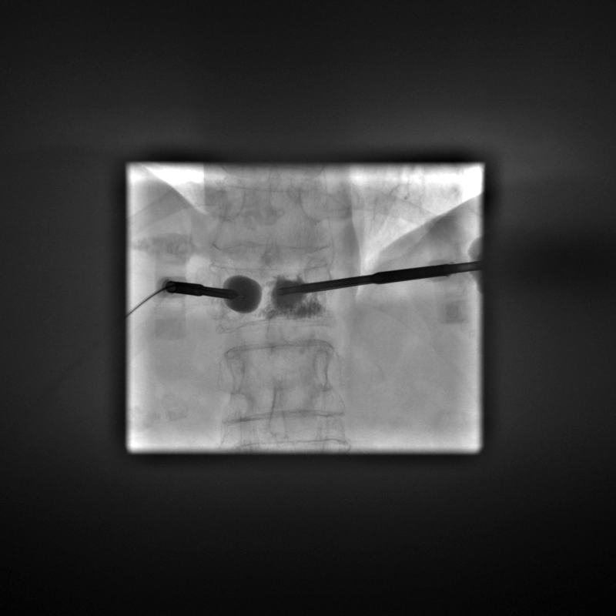
[im 16/23]
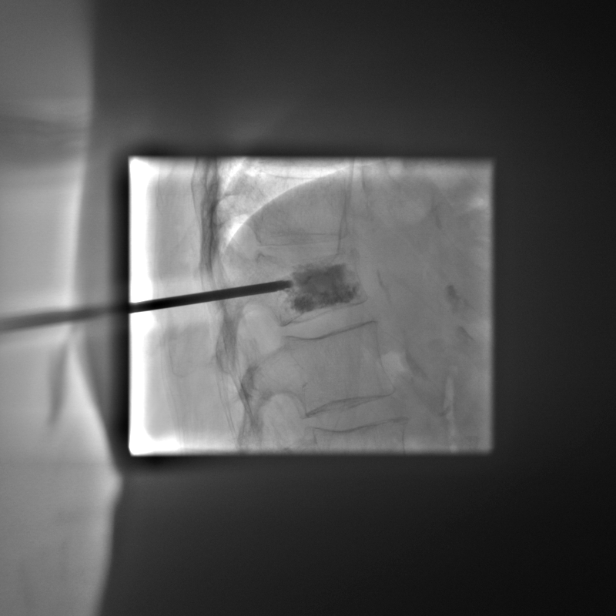
[im 18/23]
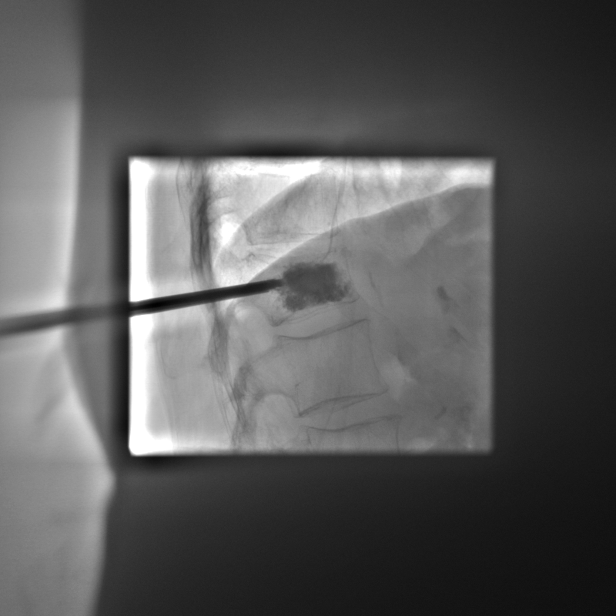
[im 20/23]
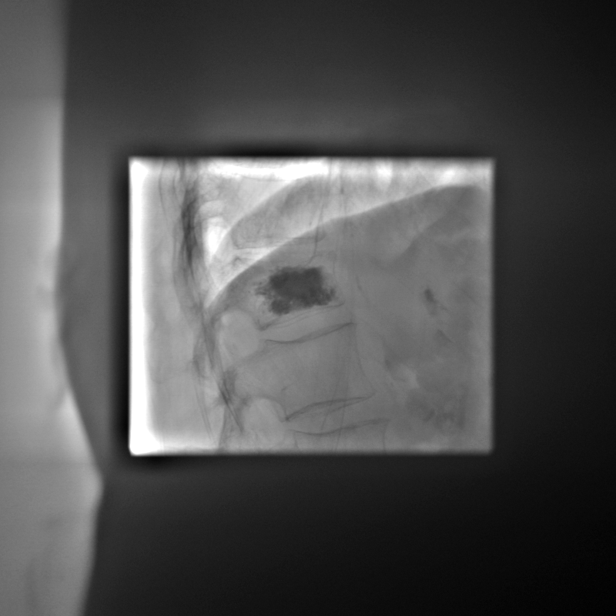
[im 23/23]
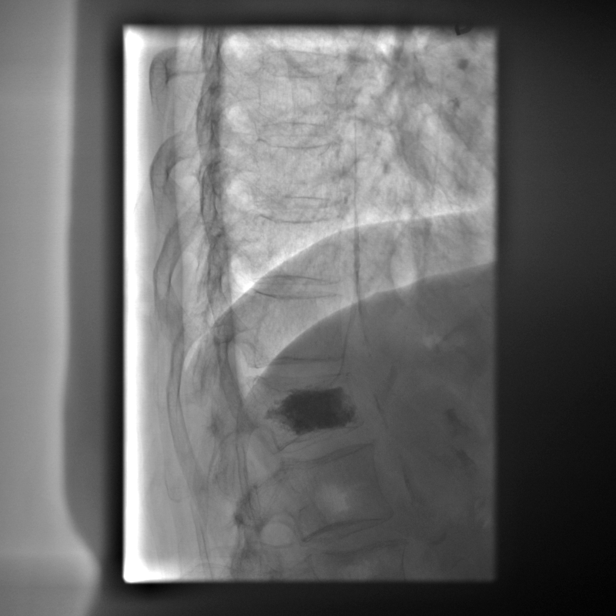

[13 of 24 positions shown; findings below may reference images not displayed]

CT L-spine, [DATE].

MEDICATIONS:
As antibiotic prophylaxis, Ancef 2 gm IV was ordered pre-procedure
and administered intravenously within 1 hour of incision.

ANESTHESIA/SEDATION:
Moderate (conscious) sedation was employed during this procedure. A
total of Versed 4 mg and Fentanyl 200 mcg was administered
intravenously.

Moderate Sedation Time: 47 minutes. The patient's level of
consciousness and vital signs were monitored continuously by
radiology nursing throughout the procedure under my direct
supervision.

FLUOROSCOPY:
9.3 min (194 mGy)

COMPLICATIONS:
None immediate.

PROCEDURE:
The procedure, risks (including but not limited to bleeding,
infection, organ damage), benefits, and alternatives were explained
to the the patient and/or patient's representative . Questions
regarding the procedure were encouraged and answered. The patient
understands and consents to the procedure.

The patient was placed prone on the fluoroscopic table. The skin
overlying the thoracolumbar region was then prepped and draped in
the usual sterile fashion. Maximal barrier sterile technique was
utilized including caps, mask, sterile gowns, sterile gloves,
sterile drape, hand hygiene and skin antiseptic. Intravenous

Fentanyl and Versed were administered as conscious sedation during
continuous cardiorespiratory monitoring by the radiology RN.

The pedicles at T12 vertebral body was then infiltrated with 1%
lidocaine followed by the advancement of a Kyphon trocar needle
transpedicular at the LEFT than RIGHT pedicle into the posterior
one-third of the vertebral body. Subsequently, the osteo drill was
advanced to the anterior third of the vertebral body. The osteo
drill was retracted. Through the working cannula, a Kyphon
inflatable bone tamp 15 x 3 was advanced and positioned with the
distal marker approximately 5 mm from the anterior aspect of the
cortex. Appropriate positioning was confirmed on the AP projection.
At this time, the balloons were expanded using contrast via a Kyphon
inflation syringe device via micro tubing.

Inflations was continued until there was near apposition with the
superior end plate. At this time, methylmethacrylate (PMMA) mixture
was reconstituted in the Kyphon bone mixing device system. This was
then loaded into the delivery mechanism, attached to Kyphon bone
filler.

The balloon was deflated and removed followed by the instillation of
methylmethacrylate mixture with excellent filling in the AP and
lateral projections. Approximately 8 mL PMMA was administered. No
extravasation was noted in the disk spaces or posteriorly into the
spinal canal. A trace volume of epidural venous contamination was
seen.

The working cannula and the bone filler was then retrieved and
removed. Hemostasis was achieved with manual compression. The
patient tolerated the procedure well without immediate
postprocedural complication.
IMPRESSION: 1. Successful T12 vertebral body augmentation using balloon
kyphoplasty, using a bipedicular approach as above.
2. A trace volume of epidural venous contamination of PMMA was seen,
tracking along the LEFT paravertebral space.

PLAN:
The patient has suffered a fracture of the T12 vertebral body. It is
recommended that patients aged 50 years or older be evaluated for
possible testing or treatment of osteoporosis. A copy of this
procedure report is sent to the patient's referring physician

Per CMS PQRS reporting requirements (PQRS Measure 24): Given the
patient's age of greater than 50 and the fracture site (hip, distal
radius, or spine), the patient should be tested for osteoporosis
using DXA, and the appropriate treatment considered based on the DXA
results.

## 2021-05-10 MED ORDER — IOHEXOL 300 MG/ML  SOLN
50.0000 mL | Freq: Once | INTRAMUSCULAR | Status: AC | PRN
  Administered 2021-05-10: 50 mL

## 2021-05-10 MED ORDER — FENTANYL CITRATE (PF) 100 MCG/2ML IJ SOLN
INTRAMUSCULAR | Status: AC | PRN
Start: 2021-05-10 — End: 2021-05-10
  Administered 2021-05-10 (×4): 50 ug via INTRAVENOUS

## 2021-05-10 MED ORDER — MIDAZOLAM HCL 2 MG/2ML IJ SOLN
INTRAMUSCULAR | Status: AC | PRN
  Administered 2021-05-10 (×4): 1 mg via INTRAVENOUS

## 2021-05-10 MED ORDER — FENTANYL CITRATE (PF) 100 MCG/2ML IJ SOLN
INTRAMUSCULAR | Status: AC
  Filled 2021-05-10: qty 2

## 2021-05-10 MED ORDER — LIDOCAINE HCL (PF) 1 % IJ SOLN
INTRAMUSCULAR | Status: AC
  Filled 2021-05-10: qty 30

## 2021-05-10 MED ORDER — CEFAZOLIN SODIUM-DEXTROSE 2-4 GM/100ML-% IV SOLN
INTRAVENOUS | Status: AC
  Administered 2021-05-10: 2 g via INTRAVENOUS
  Filled 2021-05-10: qty 100

## 2021-05-10 MED ORDER — MIDAZOLAM HCL 2 MG/2ML IJ SOLN
INTRAMUSCULAR | Status: AC
  Filled 2021-05-10: qty 4

## 2021-05-10 NOTE — Plan of Care (Signed)

## 2021-05-10 NOTE — Progress Notes (Signed)
PROGRESS NOTE    Christina Fry  IDP:[id number] DOB: 1941-01-14 DOA: 04/28/2021 PCP: Ferdie Ping, MD  Chief Complaint  Patient presents with   Back Pain    Brief Narrative:  Thana Ramp Lippe is [place] 81 y.o. female with PMH significant for DM2, HLD, Graves' disease, hypothyroidism, polymyalgia rheumatica, fibromyalgia, history of PE, GERD, anxiety who lives at an independent living facility, at baseline uses Mailyn Steichen motorized wheelchair at home but is able to take several steps and transferring chair and bed/toilet.  She completed Precious Segall course of antibiotics for UTI last week but continued to have dysuria. On 2/14, while getting up from her couch, patient sustained Fintan Grater sudden onset of sharp back pain from the lower thoracic area to the hip.  It remained constant for 24 hours and did not respond to tramadol or topical cream.  She then presented to the ED on 2/15. In the ED, patient was hemodynamically stable. Labs with potassium low at 3.1, calcium low at 5.9, albumin 2.3, hemoglobin low at 9.2, glucose low at 67 Urinalysis with clear amber color urine positive nitrite. CT abdomen pelvis showed interval development of mild superior endplate depression of W43 vertebral body concerning for fracture of indeterminate age.  ED physician discussed the case with neurosurgeon Dr. [name].  He recommended TLSO, PT OT, WBAT and Kaniesha Barile follow-up in the office after 2 weeks. In the ED, she required fentanyl IV for pain control. Admitted to hospitalist service primarily for pain management.    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 [name] from neurosurgery on 2/15 and recommended TLSO, WBAT, PT/OT  Hyponatremia improved Will continue to monitor  Hypocalcemia Follow albumin  Essential hypertension- (present on admission) Metoprolol  Hypokalemia improved  Hypothyroidism- (present on admission) Elevated TSH Will increase dose as she reports adherence (increase from 88 to 100 mcg) Follow repeat in 4-6 weeks  History of pulmonary embolus (PE) eliquis currently on hold with need for eventual procedure Currently on lovenox, will need to discuss timing of her PE (she was unclear, but I see hx PE noted in 08/2020 d/c summary, so likely ok to hold for [place] few days) Will discuss when ok to resume therapeutic anticoagulation with IR  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 Wheelchair bound, able to transfer Continue PT/OT  Anemia Continue b12, iron supplementation  Constipation She's allergic to milk of mag, soap sud enema ordered prn Continue bowel regimen  Acute conjunctivitis of right eye cipro drops  GERD (gastroesophageal reflux disease)- (present on admission) PPI   DVT prophylaxis: lovenox ordered 2/24, dvt ppx wasn't ordered Wed/Thurs Code Status: dnr Family Communication: none at bedside Disposition:   Status is: Inpatient Remains inpatient appropriate because: need for IR procedure, continued IV pain meds    Consultants:  IR  Procedures:  none  Antimicrobials:  Anti-infectives (From admission,  onward)    Start     Dose/Rate Route Frequency Ordered Stop   05/10/21 1230  ceFAZolin (ANCEF) IVPB 2g/100 mL premix        2 g 200 mL/hr over 30 Minutes Intravenous To  Radiology 05/07/21 1022 05/11/21 1230   04/29/21 1300  acyclovir (ZOVIRAX) 200 MG capsule 200 mg        200 mg Oral 2 times daily 04/29/21 0959         Subjective: No new complaints  Objective: Vitals:   05/09/21 1453 05/09/21 2011 05/10/21 0551 05/10/21 1620  BP: 132/69 (!) 142/65 (!) 139/57 (!) 184/100  Pulse: 81 78 79 93  Resp: 18 14 18 12   Temp: 99 F (37.2 C) 98.9 F (37.2 C) 98.6 F (37 C)   TempSrc: Oral Oral Oral   SpO2: 92% 90% 90% 99%  Weight:      Height:        Intake/Output Summary (Last 24 hours) at 05/10/2021 1635 Last data filed at 05/10/2021 0545 Gross per 24 hour  Intake 2442 ml  Output 200 ml  Net 2242 ml   Filed Weights   04/30/21 1055  Weight: 79.8 kg    Examination:  General: No acute distress. Cardiovascular: RRR Lungs: unlabored Abdomen: has abdominal discomfort with palpation, causes spasms and back pain Neurological: Alert and oriented 3. Moves all extremities 4 . Cranial nerves II through XII grossly intact. Skin: Warm and dry. No rashes or lesions. Extremities: No clubbing or cyanosis. No edema.  Data Reviewed: I have personally reviewed following labs and imaging studies  CBC: Recent Labs  Lab 05/05/21 0554 05/06/21 0745 05/07/21 0624 05/08/21 0616 05/09/21 0642 05/10/21 0547  WBC 9.1 9.2 8.1 8.3 8.9 8.2  NEUTROABS 5.8 5.9  --   --  5.9  --   HGB 11.0* 10.6* 9.6* 10.2* 10.8* 10.6*  HCT 35.9* 34.0* 30.5* 32.3* 35.2* 34.7*  MCV 97.0 97.7 96.5 95.6 97.5 97.2  PLT 299 316 314 354 393 712    Basic Metabolic Panel: Recent Labs  Lab 05/06/21 0745 05/07/21 0624 05/08/21 0616 05/09/21 0642 05/10/21 0547  NA 132* 134* 135 136 136  K 4.0 4.0 3.5 3.7 4.1  CL 99 102 102 99 101  CO2 27 27 28 28 30   GLUCOSE 99 92 91 127* 96  BUN 18 16 13 12 12   CREATININE 0.95 0.98 0.99 1.11* 1.05*  CALCIUM 8.7* 8.7* 8.7* 9.0 9.0  MG 1.8  --   --  1.8  --   PHOS 3.6  --   --  3.3  --     GFR: Estimated Creatinine Clearance: 40.6  mL/min (Curtistine Pettitt) (by C-G formula based on SCr of 1.05 mg/dL (H)).  Liver Function Tests: Recent Labs  Lab 05/05/21 0554 05/06/21 0745 05/09/21 0642  AST  --  15 17  ALT  --  11 13  ALKPHOS  --  73 95  BILITOT  --  0.3 0.3  PROT  --  6.0* 6.2*  ALBUMIN 2.8* 2.8* 3.0*    CBG: No results for input(s): GLUCAP in the last 168 hours.   No results found for this or any previous visit (from the past 240 hour(s)).        Radiology Studies: No results found.      Scheduled Meds:  acyclovir  200 mg Oral BID   atorvastatin  20 mg Oral Daily   bisacodyl  10 mg Rectal Daily   calcium carbonate  1 tablet  Oral BID   calcium-vitamin D  1 tablet Oral Q breakfast   cholecalciferol  1,000 Units Oral Daily   enoxaparin (LOVENOX) injection  40 mg Subcutaneous Q24H   feeding supplement  237 mL Oral BID BM   fentaNYL       ferrous sulfate  325 mg Oral Q supper   fluticasone  1 spray Each Nare Daily   gabapentin  200 mg Oral BID   levothyroxine  100 mcg Oral QAC breakfast   lidocaine  1 patch Transdermal Q24H   lidocaine (PF)       loratadine  10 mg Oral Daily   melatonin  5 mg Oral QHS   metoprolol succinate  50 mg Oral Daily   midazolam       multivitamin with minerals  1 tablet Oral Daily   pantoprazole  40 mg Oral Daily   polyethylene glycol  17 g Oral BID   senna-docusate  1 tablet Oral BID   sorbitol, milk of mag, mineral oil, glycerin (SMOG) enema  960 mL Rectal Once   vitamin B-12  1,000 mcg Oral Daily   Continuous Infusions:   ceFAZolin (ANCEF) IV 2 g (05/10/21 1627)      LOS: 11 days    Time spent: over 30 min    Fayrene Helper, MD [facility]   To contact the attending provider between 7A-7P or the covering provider during after hours 7P-7A, please log into the web site [email or web address] and access using universal [place] password for that web site. If you do not have the password, please call the hospital operator.  05/10/2021, 4:35 PM

## 2021-05-10 NOTE — Progress Notes (Signed)
Patient transported via stretcher to surgical procedure. Off unit at this time.

## 2021-05-10 NOTE — Procedures (Signed)
Vascular and Interventional Radiology Procedure Note  Patient: Chaka Boyson Boster DOB: 26-Jun-1940 Medical Record Number: [id number] Note Date/Time: 05/10/21 5:47 PM   Performing Physician: Michaelle Birks, MD Assistant(s): None  Diagnosis: Symptomatic T12 vertebral body osteoporotic fracture.  Procedure: T12 VERTEBRAL BODY KYPHOPLASTY  Anesthesia: Conscious Sedation Complications: None Estimated Blood Loss: Minimal Specimens: Sent for None  Findings:  Successful Fluoroscopy-guided T12 vertebral body, bipedicular Kyphoplasty. A total of 8 mL PMMA was used. Hemostasis of the tract was achieved using Manual Pressure.  Plan: Bed rest for 2 hours.  See detailed procedure note with images in PACS. The patient tolerated the procedure well without incident or complication and was returned to Floor Bed in stable condition.    Michaelle Birks, MD [facility]   Pager. [phone]

## 2021-05-10 NOTE — Progress Notes (Signed)
OT Cancellation Note  Patient Details Name: Christina Fry MRN: [id number] DOB: 1940-07-21   Cancelled Treatment:    Reason Eval/Treat Not Completed: Patient at procedure or test/ unavailable Patient in surgery at this time. OT to continue to follow and check back as schedule will allow.  Jackelyn Poling OTR/L, [facility] Office# [phone] Pager# [phone] 05/10/2021, 1:30 PM

## 2021-05-11 LAB — COMPREHENSIVE METABOLIC PANEL
ALT: 11 U/L (ref 0–44)
AST: 17 U/L (ref 15–41)
Albumin: 3.1 g/dL — ABNORMAL LOW (ref 3.5–5.0)
Alkaline Phosphatase: 113 U/L (ref 38–126)
Anion gap: 6 (ref 5–15)
BUN: 16 mg/dL (ref 8–23)
CO2: 29 mmol/L (ref 22–32)
Calcium: 8.7 mg/dL — ABNORMAL LOW (ref 8.9–10.3)
Chloride: 102 mmol/L (ref 98–111)
Creatinine, Ser: 1.02 mg/dL — ABNORMAL HIGH (ref 0.44–1.00)
GFR, Estimated: 55 mL/min — ABNORMAL LOW (ref >60)
Glucose, Bld: 98 mg/dL (ref 70–99)
Potassium: 4.1 mmol/L (ref 3.5–5.1)
Sodium: 137 mmol/L (ref 135–145)
Total Bilirubin: 0.4 mg/dL (ref 0.3–1.2)
Total Protein: 6.2 g/dL — ABNORMAL LOW (ref 6.5–8.1)

## 2021-05-11 LAB — CBC WITH DIFFERENTIAL/PLATELET
Abs Immature Granulocytes: 0.06 10*3/uL (ref 0.00–0.07)
Basophils Absolute: 0.1 10*3/uL (ref 0.0–0.1)
Basophils Relative: 1 %
Eosinophils Absolute: 0.2 10*3/uL (ref 0.0–0.5)
Eosinophils Relative: 2 %
HCT: 36.9 % (ref 36.0–46.0)
Hemoglobin: 11.4 g/dL — ABNORMAL LOW (ref 12.0–15.0)
Immature Granulocytes: 1 %
Lymphocytes Relative: 21 %
Lymphs Abs: 1.9 10*3/uL (ref 0.7–4.0)
MCH: 30 pg (ref 26.0–34.0)
MCHC: 30.9 g/dL (ref 30.0–36.0)
MCV: 97.1 fL (ref 80.0–100.0)
Monocytes Absolute: 0.9 10*3/uL (ref 0.1–1.0)
Monocytes Relative: 10 %
Neutro Abs: 6.2 10*3/uL (ref 1.7–7.7)
Neutrophils Relative %: 65 %
Platelets: 378 10*3/uL (ref 150–400)
RBC: 3.8 MIL/uL — ABNORMAL LOW (ref 3.87–5.11)
RDW: 14.9 % (ref 11.5–15.5)
WBC: 9.4 10*3/uL (ref 4.0–10.5)
nRBC: 0 % (ref 0.0–0.2)

## 2021-05-11 LAB — MAGNESIUM: Magnesium: 2.1 mg/dL (ref 1.7–2.4)

## 2021-05-11 LAB — PHOSPHORUS: Phosphorus: 3.5 mg/dL (ref 2.5–4.6)

## 2021-05-11 MED ORDER — MORPHINE SULFATE (PF) 2 MG/ML IV SOLN
2.0000 mg | INTRAVENOUS | Status: DC | PRN
  Administered 2021-05-12: 2 mg via INTRAVENOUS
  Filled 2021-05-11: qty 1

## 2021-05-11 MED ORDER — APIXABAN 2.5 MG PO TABS
2.5000 mg | ORAL_TABLET | Freq: Two times a day (BID) | ORAL | Status: DC
Start: 2021-05-11 — End: 2021-05-12
  Administered 2021-05-11 – 2021-05-12 (×3): 2.5 mg via ORAL
  Filled 2021-05-11 (×3): qty 1

## 2021-05-11 MED ORDER — OXYCODONE HCL 10 MG PO TABS: 10.0000 mg | ORAL_TABLET | Freq: Four times a day (QID) | ORAL | 0 refills | Status: AC | PRN

## 2021-05-11 MED ORDER — LIDOCAINE 5 % EX PTCH: 1.0000 | MEDICATED_PATCH | Freq: Every day | CUTANEOUS | 0 refills | Status: DC | PRN

## 2021-05-11 MED ORDER — LEVOTHYROXINE SODIUM 100 MCG PO TABS: 100.0000 ug | ORAL_TABLET | Freq: Every day | ORAL | 1 refills | Status: AC

## 2021-05-11 NOTE — Discharge Summary (Addendum)
Physician Discharge Summary  Christina Fry PTW:[id number] DOB: Dec 26, 1940 DOA: 04/28/2021  PCP: Ferdie Ping, MD  Admit date: 04/28/2021 Discharge date: 05/11/2021  Time spent: 40 minutes  Recommendations for Outpatient Follow-up:  Follow outpatient CBC/CMP Needs outpatient osteoporosis evaluation  Follow repeat TSH in 4-6 weeks, synthroid increased to 100 mcg daily here  Continue TLSO and therapy, follow up with IR as needed Consider neurosurgery f/u as needed, but now s/p kyphoplasty  Discharge Diagnoses:  Principal Problem:   Back pain Active Problems:   T12 compression fracture (HCC)   Hyponatremia   Hypocalcemia   Essential hypertension   Hypokalemia   Hypomagnesemia   Hypothyroidism   History of pulmonary embolus (PE)   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 female with PMH significant for DM2, HLD, Graves' disease, hypothyroidism, polymyalgia rheumatica, fibromyalgia, history of PE, GERD, anxiety who lives at an independent living facility, at baseline uses Christina Fry motorized wheelchair at home but is able to take several steps and transferring chair and bed/toilet.  On 2/14, while getting up from her couch, patient sustained Christina Fry sudden onset of sharp back pain from the lower thoracic area to the hip.  It remained constant for 24 hours and did not respond to tramadol or topical cream.  She then presented to the ED on 2/15.  She was found to have interval development of mild superior endplate depression of Christina Fry body concerning for fracture of indeterminate age.  Neurosurgery recommended TLSO, PT/OT, and WBAT.  She continued to have significant pain despite pain  meds and MRI L spine obtained on 2/21 showing acute to subacute t12 compression fracture with less than 50% loss of height and minor endplate retropulsion.  Due to persistent, intractable pain, plan was made for kyphoplasty.  She's now s/p kyphoplasty with IR on 2/27.  She should be stable for discharge on 3/1 if SNF bed ready.      Hospital Course:  Assessment and Plan: T12 compression fracture (Christina Fry) Continued poorly controlled pain S/p kyphoplasty by IR on 2/27 Will plan to discharge with tylenol, oxycodone prn, and Christina Fry lidocaine patch Continue therapy, recommending SNF, apparently will be ready tomorrow TLSO brace, EDP discussed with Dr. 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 Wheelchair bound, able to transfer Continue PT/OT  Anemia Continue b12, iron supplementation  Constipation She's allergic to milk of mag, soap sud enema ordered prn Continue bowel regimen  Acute conjunctivitis of right eye cipro drops  GERD (gastroesophageal reflux disease)- (present on admission) PPI   Procedures: IR kyphoplasty 2/27   Consultations: IR  Discharge Exam: Vitals:   05/11/21 0608 05/11/21 1312  BP: (!) 150/67 122/63  Pulse: 87 93  Resp: 14 14  Temp: 98.1 F (36.7 C) 98.7 F (37.1 C)  SpO2: 92% 93%   No new complaints Feels better Discussed with daughter - plan for SNF 3/1  General: No acute distress.  Sitting up in chair, first time i've seen her up in chair.  With  TLSO brace. Cardiovascular: RRR Lungs: unlabored Neurological: Alert and oriented 3. Moves all extremities 4. Cranial nerves II through XII grossly intact. Skin: Warm and dry. No rashes or lesions. Extremities: No clubbing or cyanosis. No edema.   Discharge Instructions   Discharge Instructions     Call MD for:  difficulty breathing, headache or visual disturbances   Complete by: As directed    Call MD for:  extreme fatigue   Complete by: As directed    Call MD for:  hives   Complete by: As directed    Call MD for:  persistant dizziness or light-headedness   Complete by: As directed    Call MD for:  persistant nausea and vomiting   Complete by: As directed    Call MD for:  redness, tenderness, or signs of infection (pain, swelling, redness, odor or green/yellow discharge around incision site)   Complete by: As directed    Call MD for:  severe uncontrolled pain   Complete by: As directed    Call MD for:  temperature >100.4   Complete by: As directed    Diet - low sodium heart healthy   Complete by: As directed    Discharge instructions   Complete by: As directed    You were seen for Christina Fry compression fracture.    You had Christina Fry kyphoplasty on 2/27 because of intractable pain.    We'll send you home with tylenol, oxycodone 10 mg every 6 hours as needed, and Christina Fry lidocaine patch for your pain.  Hopefully, you'll need less oxycodone as your pain improves post procedure.  You should be evaluated for osteoporosis and treatment outpatient.  Continue to use the TLSO brace when out of bed.  Do not bend at waist to pick up objects.  Use Christina Fry grabber if needed OR FLEX THE KNEES AND KEEP BACK STRAIGHT.  Avoid sudden twisting movements.  Continue to work with physical therapy.   We increased your synthroid.  You'll need repeat labs in 4-6 weeks (follow up TSH).   Return for new, recurrent, or worsening symptoms.  Please ask your PCP to request records from this hospitalization so they know  what was done and what the next steps will be.   Discharge wound care:   Complete by: As directed    Per interventional radiology   Increase activity slowly   Complete by: As directed       Allergies as of 05/11/2021       Reactions   Iohexol Shortness Of Breath   Pt stopped breathing at [place]. Pt refuses IV dye   Codeine Itching   Fentanyl Other (See Comments)   Agitation & confusion   Morphine And Related Itching   Nitrofuran Derivatives Other (See Comments)   Unknown   Oxycodone-acetaminophen Itching        Medication List     STOP taking these medications    traMADol 50 MG tablet Commonly known as: ULTRAM       TAKE these medications    acetaminophen 325 MG tablet Commonly known as: TYLENOL Take 975 mg by mouth 2 (two) times daily.   acyclovir 200 MG capsule Commonly known as: Zovirax Take 1 capsule (200 mg  total) by mouth 2 (two) times daily.   albuterol 108 (90 Base) MCG/ACT inhaler Commonly known as: VENTOLIN HFA Inhale 2 puffs into the lungs every 6 (six) hours as needed for wheezing or shortness of breath.   apixaban 2.5 MG Tabs tablet Commonly known as: ELIQUIS Take 2.5 mg by mouth 2 (two) times daily.   atorvastatin 40 MG tablet Commonly known as: LIPITOR Take 20 mg by mouth daily.   budesonide 32 MCG/ACT nasal spray Commonly known as: RHINOCORT AQUA one spray per nostril daily (aim for the ear on each side What changed:  how much to take when to take this reasons to take this additional instructions   calcium carbonate 500 MG chewable tablet Commonly known as: TUMS - dosed in mg elemental calcium Chew 500 mg by mouth 2 (two) times daily.   calcium-vitamin D 500-200 MG-UNIT tablet Commonly known as: OSCAL WITH D Take 1 tablet by mouth daily with breakfast.   cholecalciferol 25 MCG (1000 UNIT) tablet Commonly known as: VITAMIN D3 Take 1,000 Units by mouth daily.   docusate sodium 100 MG capsule Commonly known as:  Colace Take 1 capsule (100 mg total) by mouth daily.   EPINEPHrine 0.3 mg/0.3 mL Soaj injection Commonly known as: EPI-PEN Inject 0.3 mg into the muscle as needed for anaphylaxis. What changed: when to take this   feeding supplement (GLUCERNA SHAKE) Liqd Take 237 mLs by mouth 3 (three) times daily between meals.   ferrous sulfate 325 (65 FE) MG tablet Take 1 tablet (325 mg total) by mouth daily.   Flinstones Gummies Omega-3 DHA Chew Chew 1 tablet by mouth daily.   gabapentin 100 MG capsule Commonly known as: NEURONTIN Take 200 mg by mouth 2 (two) times daily. What changed: Another medication with the same name was removed. Continue taking this medication, and follow the directions you see here.   hydrOXYzine 10 MG tablet Commonly known as: ATARAX Take 1 tablet (10 mg total) by mouth 2 (two) times daily as needed for itching.   ipratropium 0.06 % nasal spray Commonly known as: ATROVENT Place 2 sprays into both nostrils 3 (three) times daily.   levothyroxine 100 MCG tablet Commonly known as: SYNTHROID Take 1 tablet (100 mcg total) by mouth daily before breakfast. Start taking on: May 12, 2021 What changed:  medication strength how much to take   lidocaine 5 % Commonly known as: LIDODERM Place 1 patch onto the skin daily as needed (back pain). Remove & Discard patch within 12 hours   loratadine 10 MG tablet Commonly known as: CLARITIN Take 1 tablet (10 mg total) by mouth daily as needed for allergies.   melatonin 5 MG Tabs Take 5 mg by mouth at bedtime.   Metoprolol Succinate 50 MG Cs24 Take 50 mg by mouth daily.   NON FORMULARY Inject 1 Dose into the skin once Ady Heimann week. Allergy shots; patient not sure about name   nortriptyline 25 MG capsule Commonly known as: PAMELOR Take 50 mg by mouth at bedtime.   omeprazole 20 MG capsule Commonly known as: PRILOSEC Take 20 mg by mouth 2 (two) times daily.   Oxycodone HCl 10 MG Tabs Take 1 tablet (10 mg total) by mouth  every 6 (six) hours as needed for up to 5 days for severe pain.   SYSTANE COMPLETE OP Place 1 drop into both eyes See admin instructions. Five times Deanna Boehlke day               Discharge Care Instructions  (  From admission, onward)           Start     Ordered   05/11/21 0000  Discharge wound care:       Comments: Per interventional radiology   05/11/21 1713           Allergies  Allergen Reactions   Iohexol Shortness Of Breath    Pt stopped breathing at [place]. Pt refuses IV dye   Codeine Itching   Fentanyl Other (See Comments)    Agitation & confusion   Morphine And Related Itching   Nitrofuran Derivatives Other (See Comments)    Unknown   Oxycodone-Acetaminophen Itching      The results of significant diagnostics from this hospitalization (including imaging, microbiology, ancillary and laboratory) are listed below for reference.    Significant Diagnostic Studies: CT ABDOMEN PELVIS WO CONTRAST  Result Date: 04/28/2021 CLINICAL DATA:  Acute back pain. EXAM: CT ABDOMEN AND PELVIS WITHOUT CONTRAST TECHNIQUE: Multidetector CT imaging of the abdomen and pelvis was performed following the standard protocol without IV contrast. RADIATION DOSE REDUCTION: This exam was performed according to the departmental dose-optimization program which includes automated exposure control, adjustment of the mA and/or kV according to patient size and/or use of iterative reconstruction technique. COMPARISON:  March 16, 2012. FINDINGS: Lower chest: No acute abnormality. Hepatobiliary: Status post cholecystectomy. No biliary dilatation is noted. Stable right hepatic cyst. Pancreas: Unremarkable. No pancreatic ductal dilatation or surrounding inflammatory changes. Spleen: Normal in size without focal abnormality. Adrenals/Urinary Tract: Adrenal glands appear normal. Bilateral renal cortical atrophy is noted. No hydronephrosis or renal obstruction is noted. Urinary bladder is unremarkable. No renal  or ureteral calculi are noted. Stomach/Bowel: The stomach appears normal. There is no evidence of bowel obstruction or inflammation. Status post appendectomy. Sigmoid diverticulosis is noted without inflammation. Vascular/Lymphatic: Aortic atherosclerosis. No enlarged abdominal or pelvic lymph nodes. Reproductive: Status post hysterectomy. No adnexal masses. Other: No abdominal wall hernia or abnormality. No abdominopelvic ascites. Musculoskeletal: There is interval development of superior endplate depression of N05 vertebral body concerning for fracture of indeterminate age. IMPRESSION: Sigmoid diverticulosis without inflammation. Interval development of mild superior endplate depression of L97 vertebral body concerning for fracture of indeterminate age. MRI may be performed for further evaluation. No other significant abnormality seen in the abdomen pelvis. Aortic Atherosclerosis (ICD10-I70.0). Electronically Signed   By: Marijo Conception M.D.   On: 04/28/2021 14:00   MR LUMBAR SPINE WO CONTRAST  Result Date: 05/04/2021 CLINICAL DATA:  Severe back pain with T12 compression fracture EXAM: MRI LUMBAR SPINE WITHOUT CONTRAST TECHNIQUE: Multiplanar, multisequence MR imaging of the lumbar spine was performed. No intravenous contrast was administered. COMPARISON:  Correlation made with the recent CT FINDINGS: Segmentation:  Standard. Alignment:  Minor endplate retropulsion at T12. dextrocurvature. Vertebrae: T12 compression fracture with less than 50% loss of height at the superior endplate. This is similar to the prior study. Marrow edema is present. Lumbar vertebral body heights are maintained. Mildly heterogeneous marrow signal likely reflects decreased mineralization. No suspicious osseous lesion. Conus medullaris and cauda equina: Conus extends to the L1-L2 level. Conus and cauda equina appear normal. Paraspinal and other soft tissues: Unremarkable. Disc levels: L1-L2:  Minimal disc bulge.  No canal or foraminal  stenosis. L2-L3: Minimal disc bulge eccentric to the right. No canal or foraminal stenosis. L3-L4:  Minimal disc bulge.  No canal or foraminal stenosis. L4-L5: Minimal disc bulge. Mild facet arthropathy. No canal or foraminal stenosis. L5-S1: Disc space narrowing. Disc bulge with endplate  osteophytic ridging. Mild facet arthropathy. No canal or foraminal stenosis. IMPRESSION: Acute to subacute T12 compression fracture with similar less than 50% loss of height and minor endplate retropulsion. No evidence to suggest this is Skila Rollins pathologic fracture. Mild lumbar spine degenerative changes. Electronically Signed   By: Macy Mis M.D.   On: 05/04/2021 13:41   CT L-SPINE NO CHARGE  Result Date: 04/28/2021 CLINICAL DATA:  Back pain beginning last night after standing up from Omaya Nieland couch. EXAM: CT LUMBAR SPINE WITHOUT CONTRAST TECHNIQUE: Multidetector CT imaging of the lumbar spine was performed without intravenous contrast administration. Multiplanar CT image reconstructions were also generated. RADIATION DOSE REDUCTION: This exam was performed according to the departmental dose-optimization program which includes automated exposure control, adjustment of the mA and/or kV according to patient size and/or use of iterative reconstruction technique. COMPARISON:  Lumbar spine CT 11/23/2019 FINDINGS: Segmentation: 5 lumbar type vertebrae. Alignment: Slight right convex curvature of the upper lumbar spine. No significant listhesis. Vertebrae: T12 superior endplate compression fracture with 15% vertebral body height loss, new from 2021 and age-indeterminate but potentially recent. No lumbar spine fracture. No suspicious osseous lesion. Diffuse osteopenia. Paraspinal and other soft tissues: No acute abnormality in the paraspinal soft tissues. Intra-abdominal and pelvic contents reported separately. Disc levels: Moderate disc space narrowing at L5-S1 with preserved disc space heights elsewhere. Mild-to-moderate lower lumbar facet  arthrosis. No evidence of high-grade spinal canal or neural foraminal stenosis. IMPRESSION: 1. Mild T12 compression fracture, new from 2021 and of indeterminate acuity but potentially recent. MRI could be performed for further evaluation if clinically indicated. 2. No lumbar spine fracture. Electronically Signed   By: Logan Bores M.D.   On: 04/28/2021 14:16   IR KYPHO THORACIC WITH BONE BIOPSY  Result Date: 05/11/2021 CLINICAL DATA:  Osteoporotic T12 vertebral body fracture. Symptomatic, failed medical management. EXAM: VERTEBRAL AUGMENTATION WITH BALLOON KYPHOPLASTY OF T12 VERTEBRAL BODY COMPARISON:  MR L-spine, 05/04/2021.  CT L-spine, 04/28/2021. MEDICATIONS: As antibiotic prophylaxis, Ancef 2 gm IV was ordered pre-procedure and administered intravenously within 1 hour of incision. ANESTHESIA/SEDATION: Moderate (conscious) sedation was employed during this procedure. Ivianna Notch total of Versed 4 mg and Fentanyl 200 mcg was administered intravenously. Moderate Sedation Time: 47 minutes. The patient's level of consciousness and vital signs were monitored continuously by radiology nursing throughout the procedure under my direct supervision. FLUOROSCOPY: 9.3 min (481 mGy) COMPLICATIONS: None immediate. PROCEDURE: The procedure, risks (including but not limited to bleeding, infection, organ damage), benefits, and alternatives were explained to the the patient and/or patient's representative . Questions regarding the procedure were encouraged and answered. The patient understands and consents to the procedure. The patient was placed prone on the fluoroscopic table. The skin overlying the thoracolumbar region was then prepped and draped in the usual sterile fashion. Maximal barrier sterile technique was utilized including caps, mask, sterile gowns, sterile gloves, sterile drape, hand hygiene and skin antiseptic. Intravenous Fentanyl and Versed were administered as conscious sedation during continuous cardiorespiratory  monitoring by the radiology RN. The pedicles at T12 vertebral body was then infiltrated with 1% lidocaine followed by the advancement of Chantz Montefusco Kyphon trocar needle transpedicular at the LEFT than RIGHT pedicle into the posterior one-third of the vertebral body. Subsequently, the osteo drill was advanced to the anterior third of the vertebral body. The osteo drill was retracted. Through the working cannula, Romano Stigger Kyphon inflatable bone tamp 15 x 3 was advanced and positioned with the distal marker approximately 5 mm from the anterior aspect of the cortex. Appropriate  positioning was confirmed on the AP projection. At this time, the balloons were expanded using contrast via Krissy Orebaugh Kyphon inflation syringe device via micro tubing. Inflations was continued until there was near apposition with the superior end plate. At this time, methylmethacrylate (PMMA) mixture was reconstituted in the Kyphon bone mixing device system. This was then loaded into the delivery mechanism, attached to Kyphon bone filler. The balloon was deflated and removed followed by the instillation of methylmethacrylate mixture with excellent filling in the AP and lateral projections. Approximately 8 mL PMMA was administered. No extravasation was noted in the disk spaces or posteriorly into the spinal canal. Zaveon Gillen trace volume of epidural venous contamination was seen. The working cannula and the bone filler was then retrieved and removed. Hemostasis was achieved with manual compression. The patient tolerated the procedure well without immediate postprocedural complication. IMPRESSION: 1. Successful T12 vertebral body augmentation using balloon kyphoplasty, using Tiondra Fang bipedicular approach as above. 2. Nattaly Yebra trace volume of epidural venous contamination of PMMA was seen, tracking along the LEFT paravertebral space. PLAN: The patient has suffered Melony Tenpas fracture of the T12 vertebral body. It is recommended that patients aged 61 years or older be evaluated for possible testing or  treatment of osteoporosis. Lashea Goda copy of this procedure report is sent to the patient's referring physician Per CMS PQRS reporting requirements (PQRS Measure 24): Given the patient's age of greater than 77 and the fracture site (hip, distal radius, or spine), the patient should be tested for osteoporosis using DXA, and the appropriate treatment considered based on the DXA results. Michaelle Birks, MD [facility] Radiology Electronically Signed   By: Michaelle Birks M.D.   On: 05/11/2021 16:30    Microbiology: No results found for this or any previous visit (from the past 240 hour(s)).   Labs: Basic Metabolic Panel: Recent Labs  Lab 05/06/21 0745 05/07/21 2423 05/08/21 0616 05/09/21 0642 05/10/21 0547 05/11/21 0512  NA 132* 134* 135 136 136 137  K 4.0 4.0 3.5 3.7 4.1 4.1  CL 99 102 102 99 101 102  CO2 27 27 28 28 30 29   GLUCOSE 99 92 91 127* 96 98  BUN 18 16 13 12 12 16   CREATININE 0.95 0.98 0.99 1.11* 1.05* 1.02*  CALCIUM 8.7* 8.7* 8.7* 9.0 9.0 8.7*  MG 1.8  --   --  1.8  --  2.1  PHOS 3.6  --   --  3.3  --  3.5   Liver Function Tests: Recent Labs  Lab 05/05/21 0554 05/06/21 0745 05/09/21 0642 05/11/21 0512  AST  --  15 17 17   ALT  --  11 13 11   ALKPHOS  --  73 95 113  BILITOT  --  0.3 0.3 0.4  PROT  --  6.0* 6.2* 6.2*  ALBUMIN 2.8* 2.8* 3.0* 3.1*   No results for input(s): LIPASE, AMYLASE in the last 168 hours. No results for input(s): AMMONIA in the last 168 hours. CBC: Recent Labs  Lab 05/05/21 0554 05/06/21 0745 05/07/21 0624 05/08/21 0616 05/09/21 0642 05/10/21 0547 05/11/21 0512  WBC 9.1 9.2 8.1 8.3 8.9 8.2 9.4  NEUTROABS 5.8 5.9  --   --  5.9  --  6.2  HGB 11.0* 10.6* 9.6* 10.2* 10.8* 10.6* 11.4*  HCT 35.9* 34.0* 30.5* 32.3* 35.2* 34.7* 36.9  MCV 97.0 97.7 96.5 95.6 97.5 97.2 97.1  PLT 299 316 314 354 393 388 378   Cardiac Enzymes: No results for input(s): CKTOTAL, CKMB, CKMBINDEX,  TROPONINI in the last 168  hours. BNP: BNP (last 3 results) No results for input(s): BNP in the last 8760 hours.  ProBNP (last 3 results) No results for input(s): PROBNP in the last 8760 hours.  CBG: No results for input(s): GLUCAP in the last 168 hours.     Signed:  Fayrene Helper MD.  [facility] 05/11/2021, 5:17 PM

## 2021-05-11 NOTE — Progress Notes (Signed)
Physical Therapy Treatment Patient Details Name: Christina Fry MRN: [id number] DOB: 1940-04-21 Today's Date: 05/11/2021   History of Present Illness Patient is an 81 year old female presenting with back pain that started 2/15 after getting up off couch. Imaging reveal age indeterminant mild T12 compression fracture. ED physician discussed the case with neurosurgeon Dr. [name] who recommended TLSO, PT OT, WBAT and a follow-up in the office after 2 weeks. Pt is s/p T12 kyphoplasty 2/27. PMH significant for DM2, HLD, Graves' disease, hypothyroidism, polymyalgia rheumatica, fibromyalgia, history of PE, GERD    PT Comments    Pt educated on therapeutic process and agreeable to therapy with encouragement from therapists and daughter. VC for log roll technique and sequencing with transfers. Min A to come to sitting EOB through log roll, therapist dons TLSO. Min A+2 for safety with powering to stand and pivot to recliner, HHA bilaterally, able to take a few sidesteps to recliner, reports electric w/c at baseline so ambulation not attempted. Pt dizzy and nauseas after transfer complete, VSS, possibly due to lack of eating and pain medication. Pt reports 8/10 pain in back throughout session, denies numbness/tingling anywhere on body. Pt tolerates remaining up in recliner with daughter at bedside.    Recommendations for follow up therapy are one component of a multi-disciplinary discharge planning process, led by the attending physician.  Recommendations may be updated based on patient status, additional functional criteria and insurance authorization.  Follow Up Recommendations  Skilled nursing-short term rehab (<3 hours/day)     Assistance Recommended at Discharge Frequent or constant Supervision/Assistance  Patient can return home with the following Two people to help with walking and/or transfers;Two people to help with bathing/dressing/bathroom;Assistance with cooking/housework;Assist for transportation    Equipment Recommendations  None recommended by PT    Recommendations for Other Services       Precautions / Restrictions Precautions Precautions: Back Required Braces or Orthoses: Spinal Brace Spinal Brace: Thoracolumbosacral orthotic;Applied in sitting position Restrictions Weight Bearing Restrictions: No     Mobility  Bed Mobility Overal bed mobility: Needs Assistance Bed Mobility: Rolling, Sidelying to Sit Rolling: Min guard Sidelying to sit: Min assist  General bed mobility comments: VC for log roll technique, slow to roll to side requiring VC, min A to upright trunk into sitting and clear BLE off of bed    Transfers Overall transfer level: Needs assistance Equipment used: 2 person hand held assist Transfers: Sit to/from Stand, Bed to chair/wheelchair/BSC Sit to Stand: Min assist, +2 safety/equipment  Step pivot transfers: Min assist, +2 safety/equipment  General transfer comment: min A+2 for safety to power to stand and step pivot to recliner, heavy VC for hand placement and sequencing to improve safety and independence, pt declines RW use    Ambulation/Gait  General Gait Details: pt reports using electric w/c at baseline, able to take a few short steps over to recliner with bil HHA   Stairs             Wheelchair Mobility    Modified Rankin (Stroke Patients Only)       Balance Overall balance assessment: History of Falls, Needs assistance Sitting-balance support: Feet supported, Bilateral upper extremity supported Sitting balance-Leahy Scale: Poor  Standing balance support: During functional activity, Bilateral upper extremity supported Standing balance-Leahy Scale: Poor Standing balance comment: Reliant on external assistance     Cognition Arousal/Alertness: Awake/alert Behavior During Therapy: WFL for tasks assessed/performed, Anxious Overall Cognitive Status: Within Functional Limits for tasks assessed  General Comments: Pt  initially anxious  to mobilize, but agreeable with therapy and daughter encouragement once pain medication on board        Exercises      General Comments General comments (skin integrity, edema, etc.): Pt dizzy and nauseas with transfer, VSS, pt reports not eating well and taking pain medication      Pertinent Vitals/Pain Pain Assessment Pain Assessment: 0-10 Pain Score: 8  Pain Location: back Pain Descriptors / Indicators: Aching, Discomfort, Sore Pain Intervention(s): Limited activity within patient's tolerance, Monitored during session, Premedicated before session, Repositioned    Home Living                          Prior Function            PT Goals (current goals can now be found in the care plan section) Acute Rehab PT Goals Patient Stated Goal: get back to ILF, loves it at [facility] PT Goal Formulation: With patient Time For Goal Achievement: 05/13/21 Potential to Achieve Goals: Fair Progress towards PT goals: Progressing toward goals    Frequency    Min 3X/week      PT Plan Current plan remains appropriate    Co-evaluation              AM-PAC PT "6 Clicks" Mobility   Outcome Measure  Help needed turning from your back to your side while in a flat bed without using bedrails?: A Little Help needed moving from lying on your back to sitting on the side of a flat bed without using bedrails?: A Little Help needed moving to and from a bed to a chair (including a wheelchair)?: Total Help needed standing up from a chair using your arms (e.g., wheelchair or bedside chair)?: A Lot Help needed to walk in hospital room?: Total Help needed climbing 3-5 steps with a railing? : Total 6 Click Score: 11    End of Session Equipment Utilized During Treatment: Gait belt Activity Tolerance: Patient tolerated treatment well;Other (comment) (nausea, dizziness) Patient left: in chair;with call bell/phone within reach;with family/visitor present Nurse Communication:  Mobility status;Other (comment) (nausea, dizziness, VSS) PT Visit Diagnosis: Other abnormalities of gait and mobility (R26.89);Muscle weakness (generalized) (M62.81);Difficulty in walking, not elsewhere classified (R26.2);Pain Pain - part of body:  (back)     Time: 9622-2979 PT Time Calculation (min) (ACUTE ONLY): 38 min  Charges:  $Therapeutic Activity: 8-22 mins                      Tori Adrine Hayworth PT, DPT 05/11/21, 2:14 PM

## 2021-05-11 NOTE — Progress Notes (Signed)
Occupational Therapy Treatment Patient Details Name: Christina Fry MRN: [id number] DOB: [birth year] Today's Date: 05/11/2021   History of present illness Patient is an 81 year old female presenting with back pain that started 2/15 after getting up off couch. Imaging reveal age indeterminant mild T12 compression fracture. ED physician discussed the case with neurosurgeon Dr. [name] who recommended TLSO, PT OT, WBAT and a follow-up in the office after 2 weeks. Pt is s/p T12 kyphoplasty 2/27. PMH significant for DM2, HLD, Graves' disease, hypothyroidism, polymyalgia rheumatica, fibromyalgia, history of PE, GERD   OT comments  Patient was noted to have had Kyphoplasty on 2/27. Patient is min A +2 for transfer from edge of bed to recliner in room. Patient remains TD for donning brace sitting on edge of bed with education for how to correct LOB. Patient would need 24/7 physical assist to be able to transition to next level of care at this time.patient and daughter are looking for SNF. Patient would continue to benefit from skilled OT services at this time while admitted and after d/c to address noted deficits in order to improve overall safety and independence in ADLs.     Recommendations for follow up therapy are one component of a multi-disciplinary discharge planning process, led by the attending physician.  Recommendations may be updated based on patient status, additional functional criteria and insurance authorization.    Follow Up Recommendations  Skilled nursing-short term rehab (<3 hours/day)    Assistance Recommended at Discharge Frequent or constant Supervision/Assistance  Patient can return home with the following  A lot of help with walking and/or transfers;A lot of help with bathing/dressing/bathroom;Assistance with cooking/housework;Assist for transportation;Help with stairs or ramp for entrance   Equipment Recommendations  None recommended by OT    Recommendations for Other Services       Precautions / Restrictions Precautions Precautions: Back Required Braces or Orthoses: Spinal Brace Spinal Brace: Thoracolumbosacral orthotic;Applied in sitting position Restrictions Weight Bearing Restrictions: No       Mobility Bed Mobility Overal bed mobility: Needs Assistance Bed Mobility: Rolling, Sidelying to Sit Rolling: Min guard Sidelying to sit: Min assist       General bed mobility comments: with education provided for log rolling and transition from supine to sit on edge of bed. patient needed min A to bring trunk upright and clear BLE off edge of bed.    Transfers                         Balance Overall balance assessment: History of Falls, Needs assistance Sitting-balance support: Feet supported, Bilateral upper extremity supported Sitting balance-Leahy Scale: Poor Sitting balance - Comments: occasional posterior leaning noted with patient reporting "i am going backwards" with cues and physical assist needed to right herself   Standing balance support: During functional activity, Bilateral upper extremity supported Standing balance-Leahy Scale: Poor Standing balance comment: Reliant on external assistance                           ADL either performed or assessed with clinical judgement   ADL Overall ADL's : Needs assistance/impaired     Grooming: Sitting;Wash/dry face;Minimal assistance Grooming Details (indicate cue type and reason): patient was able to blow nose sitting on edge of bed with occasional min A with education to lean shoulders forwards v.s. leaning backwards.         Upper Body Dressing : Total assistance;Sitting Upper Body Dressing  Details (indicate cue type and reason): to don TLSO on EOB     Toilet Transfer: +2 for safety/equipment;+2 for physical assistance;Minimal assistance Toilet Transfer Details (indicate cue type and reason): patient needed min A +2 to power up and transfer from edge of bed to recliner  in room. patient needed increased time and encouragement to participate in all tasks. patient reported increased nausea with transition to chair with requests for emesis bag v.s. basin.                Extremity/Trunk Assessment              Vision       Perception     Praxis      Cognition Arousal/Alertness: Awake/alert Behavior During Therapy: WFL for tasks assessed/performed, Anxious Overall Cognitive Status: Within Functional Limits for tasks assessed                                 General Comments: patient was anxious with all mobilization with requests for each task to be broken down to small parts. patients daughter was present for session as well encoraging patient to participate.        Exercises      Shoulder Instructions       General Comments Pt dizzy and nauseas with transfer, VSS, pt reports not eating well and taking pain medication    Pertinent Vitals/ Pain       Pain Assessment Pain Assessment: 0-10 Pain Score: 8  Pain Location: back Pain Descriptors / Indicators: Aching, Discomfort, Sore Pain Intervention(s): Limited activity within patient's tolerance, Monitored during session, Premedicated before session, Repositioned  Home Living                                          Prior Functioning/Environment              Frequency  Min 2X/week        Progress Toward Goals  OT Goals(current goals can now be found in the care plan section)  Progress towards OT goals: Progressing toward goals     Plan Discharge plan remains appropriate    Co-evaluation    PT/OT/SLP Co-Evaluation/Treatment: Yes Reason for Co-Treatment: For patient/therapist safety;To address functional/ADL transfers PT goals addressed during session: Mobility/safety with mobility OT goals addressed during session: ADL's and self-care      AM-PAC OT "6 Clicks" Daily Activity     Outcome Measure   Help from another person  eating meals?: A Little Help from another person taking care of personal grooming?: A Little Help from another person toileting, which includes using toliet, bedpan, or urinal?: A Lot Help from another person bathing (including washing, rinsing, drying)?: A Lot Help from another person to put on and taking off regular upper body clothing?: A Lot Help from another person to put on and taking off regular lower body clothing?: Total 6 Click Score: 13    End of Session Equipment Utilized During Treatment: Back brace  OT Visit Diagnosis: Other abnormalities of gait and mobility (R26.89);Pain   Activity Tolerance Patient tolerated treatment well   Patient Left in chair;with call bell/phone within reach;with family/visitor present   Nurse Communication Mobility status        Time: 3159-4585 OT Time Calculation (min): 38 min  Charges: OT General Charges $OT  Visit: 1 Visit OT Treatments $Self Care/Home Management : 8-22 mins $Therapeutic Activity: 8-22 mins  Jackelyn Poling OTR/L, MS Acute Rehabilitation Department Office# [phone] Pager# [phone]   Marcellina Millin 05/11/2021, 4:14 PM

## 2021-05-11 NOTE — Progress Notes (Signed)
ANTICOAGULATION CONSULT NOTE - Initial Consult  Pharmacy Consult for apixaban  Indication: hx DVT  Allergies  Allergen Reactions   Iohexol Shortness Of Breath    Pt stopped breathing at [place]. Pt refuses IV dye   Codeine Itching   Fentanyl Other (See Comments)    Agitation & confusion   Morphine And Related Itching   Nitrofuran Derivatives Other (See Comments)    Unknown   Oxycodone-Acetaminophen Itching    Patient Measurements: Height: 5\' 3"  (160 cm) Weight: 79.8 kg (175 lb 14.8 oz) IBW/kg (Calculated) : 52.4 Heparin Dosing Weight:   Vital Signs: Temp: 98.1 F (36.7 C) (02/28 0608) Temp Source: Oral (02/28 0608) BP: 150/67 (02/28 0608) Pulse Rate: 87 (02/28 0608)  Labs: Recent Labs    05/09/21 0642 05/10/21 0547 05/11/21 0512  HGB 10.8* 10.6* 11.4*  HCT 35.2* 34.7* 36.9  PLT 393 388 378  CREATININE 1.11* 1.05* 1.02*    Estimated Creatinine Clearance: 41.8 mL/min (A) (by C-G formula based on SCr of 1.02 mg/dL (H)).   Medical History: Past Medical History:  Diagnosis Date   Anxiety    Cancer ([place])    Diabetes mellitus without complication ([place])    Fibromyalgia    GERD (gastroesophageal reflux disease)    Grave's disease    Herpes    Hyperlipidemia    Mild intermittent asthma without complication 1/95/9747   Palpitation    Polymyalgia rheumatica ([facility])       Plan:  Resume PTA apixaban 2.5 mg po BID  Eudelia Bunch, Pharm.D 05/11/2021 8:15 AM

## 2021-05-11 NOTE — Progress Notes (Signed)
Patient ID: Christina Fry, female   DOB: August 21, 1940, 81 y.o.   MRN: [id number] Pt feeling better since T12 KP yesterday; still with some back pain, occ spasms but overall improved; has been OOB, working with PT; afebrile; VSS; WBC nl; hgb stable. Cont current tx with PT

## 2021-05-12 DIAGNOSIS — K219 Gastro-esophageal reflux disease without esophagitis: Secondary | ICD-10-CM

## 2021-05-12 DIAGNOSIS — D509 Iron deficiency anemia, unspecified: Secondary | ICD-10-CM

## 2021-05-12 DIAGNOSIS — K5900 Constipation, unspecified: Secondary | ICD-10-CM

## 2021-05-12 DIAGNOSIS — E871 Hypo-osmolality and hyponatremia: Secondary | ICD-10-CM

## 2021-05-12 LAB — RESP PANEL BY RT-PCR (FLU A&B, COVID) ARPGX2
Influenza A by PCR: NEGATIVE
Influenza B by PCR: NEGATIVE
SARS Coronavirus 2 by RT PCR: NEGATIVE

## 2021-05-12 NOTE — Progress Notes (Signed)
Occupational Therapy Treatment ?Patient Details ?Name: Christina Fry ?MRN: [id number] ?DOB: 1940-06-11 ?Today's Date: 05/12/2021 ? ? ?History of present illness Patient is an 81 year old female presenting with back pain that started 2/15 after getting up off couch. Imaging reveal age indeterminant mild T12 compression fracture. ED physician discussed the case with neurosurgeon Dr. [name] who recommended TLSO, PT OT, WBAT and a follow-up in the office after 2 weeks. Pt is s/p T12 kyphoplasty 2/27. PMH significant for DM2, HLD, Graves' disease, hypothyroidism, polymyalgia rheumatica, fibromyalgia, history of PE, GERD ?  ?OT comments ? Patient declined to participate in movement today. Patient was educated on participation in therapy daily to increase independence. Patient verbalized understanding reporting she wanted to save energy for rehab placement today. Patient was educate TLSO recommendations and donning/doffing as requested by patient. Patient would continue to benefit from skilled OT services at this time while admitted and after d/c to address noted deficits in order to improve overall safety and independence in ADLs.  ?  ? ?Recommendations for follow up therapy are one component of a multi-disciplinary discharge planning process, led by the attending physician.  Recommendations may be updated based on patient status, additional functional criteria and insurance authorization. ?   ?Follow Up Recommendations ? Skilled nursing-short term rehab (<3 hours/day)  ?  ?Assistance Recommended at Discharge Frequent or constant Supervision/Assistance  ?Patient can return home with the following ? A lot of help with walking and/or transfers;A lot of help with bathing/dressing/bathroom;Assistance with cooking/housework;Assist for transportation;Help with stairs or ramp for entrance ?  ?Equipment Recommendations ? None recommended by OT  ?  ?Recommendations for Other Services   ? ?  ?Precautions / Restrictions  Precautions ?Precautions: Back ?Required Braces or Orthoses: Spinal Brace ?Spinal Brace: Thoracolumbosacral orthotic;Applied in sitting position ?Restrictions ?Weight Bearing Restrictions: No  ? ? ?  ? ?Mobility Bed Mobility ?  ?  ?  ?  ?  ?  ?  ?  ?  ? ?Transfers ?  ?  ?  ?  ?  ?  ?  ?  ?  ?  ?  ?  ?Balance   ?  ?  ?  ?  ?  ?  ?  ?  ?  ?  ?  ?  ?  ?  ?  ?  ?  ?  ?   ? ?ADL either performed or assessed with clinical judgement  ? ?ADL Overall ADL's : Needs assistance/impaired ?  ?  ?  ?  ?  ?  ?  ?  ?  ?  ?  ?  ?  ?  ?  ?  ?  ?  ?  ?General ADL Comments: patient was lying supine in bed during session declining to get out of bed. patient reported she needed to save her energy for rehab. patient asked various questions about the TLSO brace and donning/doffing which were answered. patient declined to attempt to don/doff it herself at this time stating she needed to rest for rehab. patient was educated on importance of moving daily to keep up strength. ?  ? ?Extremity/Trunk Assessment   ?  ?  ?  ?  ?  ? ?Vision   ?  ?  ?Perception   ?  ?Praxis   ?  ? ?Cognition Arousal/Alertness: Awake/alert ?Behavior During Therapy: [facility] for tasks assessed/performed, Anxious ?Overall Cognitive Status: Within Functional Limits for tasks assessed ?  ?  ?  ?  ?  ?  ?  ?  ?  ?  ?  ?  ?  ?  ?  ?  ?  General Comments: patient demonstrated self limiting behaviors declining to get out of bed at this time. ?  ?  ?   ?Exercises   ? ?  ?Shoulder Instructions   ? ? ?  ?General Comments    ? ? ?Pertinent Vitals/ Pain       Pain Assessment ?Pain Assessment: 0-10 ?Pain Location: back ?Pain Descriptors / Indicators: Aching, Discomfort, Sore ?Pain Intervention(s): Monitored during session, Patient requesting pain meds-RN notified, RN gave pain meds during session ? ?Home Living   ?  ?  ?  ?  ?  ?  ?  ?  ?  ?  ?  ?  ?  ?  ?  ?  ?  ?  ? ?  ?Prior Functioning/Environment    ?  ?  ?  ?   ? ?Frequency ? Min 2X/week  ? ? ? ? ?  ?Progress Toward Goals ? ?OT  Goals(current goals can now be found in the care plan section) ? Progress towards OT goals: OT to reassess next treatment ? ?   ?Plan Discharge plan remains appropriate   ? ?Co-evaluation ? ? ?   ?  ?  ?  ?  ? ?  ?AM-PAC OT "6 Clicks" Daily Activity     ?Outcome Measure ? ? Help from another person eating meals?: A Little ?Help from another person taking care of personal grooming?: A Little ?Help from another person toileting, which includes using toliet, bedpan, or urinal?: A Lot ?Help from another person bathing (including washing, rinsing, drying)?: A Lot ?Help from another person to put on and taking off regular upper body clothing?: A Lot ?Help from another person to put on and taking off regular lower body clothing?: Total ?6 Click Score: 13 ? ?  ?End of Session Equipment Utilized During Treatment: Back brace ? ?OT Visit Diagnosis: Other abnormalities of gait and mobility (R26.89);Pain ?  ?Activity Tolerance Patient tolerated treatment well ?  ?Patient Left in bed;with call bell/phone within reach;with bed alarm set ?  ?Nurse Communication Patient requests pain meds ?  ? ?   ? ?Time: 1660-6301 ?OT Time Calculation (min): 11 min ? ?Charges: OT General Charges ?$OT Visit: 1 Visit ?OT Treatments ?$Therapeutic Activity: 8-22 mins ? ?Ellsworth Waldschmidt OTR/L, MS ?Acute Rehabilitation Department ?Office# [phone] ?Pager# [phone] ? ? ?[place] ?05/12/2021, 11:44 AM ?

## 2021-05-12 NOTE — TOC Transition Note (Addendum)
Transition of Care (TOC) - CM/SW Discharge Note ? ? ?Patient Details  ?Name: Christina Fry ?MRN: [id number] ?Date of Birth: June 22, 1940 ? ?Transition of Care (TOC) CM/SW Contact:  ?Raymona Boss, Marjie Skiff, RN ?Phone Number: ?05/12/2021, 11:36 AM ? ? ?Clinical Narrative:    ? ?SNF bed offers provided to [name] yesterday. [facility]. Pt to go under her Medicare A insurance ? ?Pt to dc to [facility] today via [facility]. She will go to room 706P and RN to call report to [phone]. [name] called to inform of dc. Yellow DNR on the chart for transport. ? ? ? ?Final next level of care: [facility] ?Barriers to Discharge: No Barriers Identified ? ?  ? ?Discharge Placement ?  ?           ?Patient chooses bed at: [facility] ?Patient to be transferred to facility by: [facility] ?Name of family member notified: [name] ?Patient and family notified of of transfer: 05/12/21 ? ? ? ?Readmission Risk Interventions ?Readmission Risk Prevention Plan 08/28/2020 12/06/2019  ?Transportation Screening Complete Complete  ?[place] or Home Care Consult Complete -  ?Social Work Consult for [place] Planning/Counseling Complete Complete  ?Palliative Care Screening Not Applicable -  ?Medication Review Press photographer) Complete Complete  ?Some recent data might be hidden  ? ? ? ? ? ?

## 2021-05-12 NOTE — Progress Notes (Signed)
Report given to receiving nurse at [facility]. 

## 2021-05-12 NOTE — Discharge Summary (Signed)
Physician Discharge Summary  [name] VHQ:[id number] DOB: 1940/10/03 DOA: 04/28/2021  PCP: Ferdie Ping, MD  Admit date: 04/28/2021 Discharge date: 05/12/2021  Time spent: 40 minutes  Recommendations for Outpatient Follow-up:  Follow outpatient CBC/CMP Needs outpatient osteoporosis evaluation  Follow repeat TSH in 4-6 weeks, synthroid increased to 100 mcg daily here  Continue TLSO and therapy, follow up with IR as needed Consider neurosurgery f/u as needed, but now s/p kyphoplasty  Discharge Diagnoses:  Principal Problem:   Back pain Active Problems:   GERD (gastroesophageal reflux disease)   Essential hypertension   Hypothyroidism   Hypokalemia   Hypocalcemia   Hypomagnesemia   T12 compression fracture (HCC)   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 female with PMH significant for DM2, HLD, Graves' disease, hypothyroidism, polymyalgia rheumatica, fibromyalgia, history of PE, GERD, anxiety who lives at an independent living facility, at baseline uses a motorized wheelchair at home but is able to take several steps and transferring chair and bed/toilet.  On 2/14, while getting up from her couch, patient sustained a sudden onset of sharp back pain from the lower thoracic area to the hip.  It remained constant for 24 hours and did not respond to tramadol or topical cream.  She then presented to the ED on 2/15.  She was found to have interval development of mild superior endplate depression of U13 vetebral body concerning for fracture of indeterminate age.  Neurosurgery recommended TLSO, PT/OT, and WBAT.  She continued to have significant pain despite pain  meds and MRI L spine obtained on 2/21 showing acute to subacute t12 compression fracture with less than 50% loss of height and minor endplate retropulsion.  Due to persistent, intractable pain, plan was made for kyphoplasty.  She's now s/p kyphoplasty with IR on 2/27.  She should be stable for discharge on 3/1 if SNF bed ready.      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 [name] from neurosurgery on 2/15 and recommended TLSO brace, WBAT, PT/OT Need to discuss treatment for osteoporosis with PCP   Hypocalcemia Follow albumin  Hypokalemia improved  Hypothyroidism- (present on admission) Elevated TSH Will increase dose as she reports adherence (increase from 88 to 100 mcg) Follow repeat in 4-6 weeks  Essential hypertension- (present on admission) Metoprolol  GERD (gastroesophageal reflux disease)- (present on admission) PPI   Procedures: IR kyphoplasty 2/27   Consultations: IR  Discharge Exam: Vitals:   05/11/21 2158 05/12/21 0609  BP: 122/63 (!) 155/63  Pulse: 74 78  Resp: 14 14  Temp: 98.5 F (36.9 C) 98.1 F (36.7 C)  SpO2: 93% 95%     General: No acute distress.  Sitting up in chair, first time i've seen her up in chair.  With TLSO brace. Cardiovascular: RRR Lungs: unlabored Neurological: Alert and  oriented 3. Moves all extremities 4. Cranial nerves II through XII grossly intact. Skin: Warm and dry. No rashes or lesions. Extremities: No clubbing or cyanosis. No edema.   Discharge Instructions   Discharge Instructions     Call MD for:  difficulty breathing, headache or visual disturbances   Complete by: As directed    Call MD for:  extreme fatigue   Complete by: As directed    Call MD for:  hives   Complete by: As directed    Call MD for:  persistant dizziness or light-headedness   Complete by: As directed    Call MD for:  persistant nausea and vomiting   Complete by: As directed    Call MD for:  redness, tenderness, or signs of infection (pain, swelling, redness, odor or green/yellow discharge around incision site)   Complete by: As directed    Call MD for:  severe uncontrolled pain   Complete by: As directed    Call MD for:  temperature >100.4   Complete by: As directed    Diet - low sodium heart healthy   Complete by: As directed    Discharge instructions   Complete by: As directed    You were seen for a compression fracture.    You had a kyphoplasty on 2/27 because of intractable pain.    We'll send you home with tylenol, oxycodone 10 mg every 6 hours as needed, and a lidocaine patch for your pain.  Hopefully, you'll need less oxycodone as your pain improves post procedure.  You should be evaluated for osteoporosis and treatment outpatient.  Continue to use the TLSO brace when out of bed.  Do not bend at waist to pick up objects.  Use a grabber if needed OR FLEX THE KNEES AND KEEP BACK STRAIGHT.  Avoid sudden twisting movements.  Continue to work with physical therapy.   We increased your synthroid.  You'll need repeat labs in 4-6 weeks (follow up TSH).   Return for new, recurrent, or worsening symptoms.  Please ask your PCP to request records from this hospitalization so they know what was done and what the next steps will be.   Discharge wound care:    Complete by: As directed    Per interventional radiology   Increase activity slowly   Complete by: As directed       Allergies as of 05/12/2021       Reactions   Iohexol Shortness Of Breath   Pt stopped breathing at [place]. Pt refuses IV dye   Codeine Itching   Fentanyl Other (See Comments)   Agitation & confusion   Morphine And Related Itching   Nitrofuran Derivatives Other (See Comments)   Unknown   Oxycodone-acetaminophen Itching        Medication List     STOP taking these medications    ipratropium 0.06 % nasal spray Commonly known as: ATROVENT   traMADol 50 MG tablet Commonly known as: ULTRAM       TAKE these medications    acetaminophen 325 MG tablet Commonly known as: TYLENOL Take 975 mg by mouth 2 (two) times daily.   acyclovir 200 MG capsule Commonly known as: Zovirax Take 1 capsule (200 mg total)  by mouth 2 (two) times daily.   albuterol 108 (90 Base) MCG/ACT inhaler Commonly known as: VENTOLIN HFA Inhale 2 puffs into the lungs every 6 (six) hours as needed for wheezing or shortness of breath.   apixaban 2.5 MG Tabs tablet Commonly known as: ELIQUIS Take 2.5 mg by mouth 2 (two) times daily.   atorvastatin 40 MG tablet Commonly known as: LIPITOR Take 20 mg by mouth daily.   budesonide 32 MCG/ACT nasal spray Commonly known as: RHINOCORT AQUA one spray per nostril daily (aim for the ear on each side What changed:  how much to take when to take this reasons to take this additional instructions   calcium carbonate 500 MG chewable tablet Commonly known as: TUMS - dosed in mg elemental calcium Chew 500 mg by mouth 2 (two) times daily.   calcium-vitamin D 500-200 MG-UNIT tablet Commonly known as: OSCAL WITH D Take 1 tablet by mouth daily with breakfast.   cholecalciferol 25 MCG (1000 UNIT) tablet Commonly known as: VITAMIN D3 Take 1,000 Units by mouth daily.   docusate sodium 100 MG capsule Commonly known as: Colace Take 1  capsule (100 mg total) by mouth daily.   EPINEPHrine 0.3 mg/0.3 mL Soaj injection Commonly known as: EPI-PEN Inject 0.3 mg into the muscle as needed for anaphylaxis. What changed: when to take this   feeding supplement (GLUCERNA SHAKE) Liqd Take 237 mLs by mouth 3 (three) times daily between meals.   ferrous sulfate 325 (65 FE) MG tablet Take 1 tablet (325 mg total) by mouth daily.   Flinstones Gummies Omega-3 DHA Chew Chew 1 tablet by mouth daily.   gabapentin 100 MG capsule Commonly known as: NEURONTIN Take 200 mg by mouth 2 (two) times daily. What changed: Another medication with the same name was removed. Continue taking this medication, and follow the directions you see here.   hydrOXYzine 10 MG tablet Commonly known as: ATARAX Take 1 tablet (10 mg total) by mouth 2 (two) times daily as needed for itching.   levothyroxine 100 MCG tablet Commonly known as: SYNTHROID Take 1 tablet (100 mcg total) by mouth daily before breakfast. What changed:  medication strength how much to take   lidocaine 5 % Commonly known as: LIDODERM Place 1 patch onto the skin daily as needed (back pain). Remove & Discard patch within 12 hours   loratadine 10 MG tablet Commonly known as: CLARITIN Take 1 tablet (10 mg total) by mouth daily as needed for allergies.   melatonin 5 MG Tabs Take 5 mg by mouth at bedtime.   Metoprolol Succinate 50 MG Cs24 Take 50 mg by mouth daily.   NON FORMULARY Inject 1 Dose into the skin once a week. Allergy shots; patient not sure about name   nortriptyline 25 MG capsule Commonly known as: PAMELOR Take 50 mg by mouth at bedtime.   omeprazole 20 MG capsule Commonly known as: PRILOSEC Take 20 mg by mouth 2 (two) times daily.   Oxycodone HCl 10 MG Tabs Take 1 tablet (10 mg total) by mouth every 6 (six) hours as needed for up to 5 days for severe pain.   SYSTANE COMPLETE OP Place 1 drop into both eyes See admin instructions. Five times a day                Discharge Care Instructions  (From admission, onward)           Start     Ordered   05/11/21 0000  Discharge wound care:  Comments: Per interventional radiology   05/11/21 1713           Allergies  Allergen Reactions   Iohexol Shortness Of Breath    Pt stopped breathing at [place]. Pt refuses IV dye   Codeine Itching   Fentanyl Other (See Comments)    Agitation & confusion   Morphine And Related Itching   Nitrofuran Derivatives Other (See Comments)    Unknown   Oxycodone-Acetaminophen Itching    Follow-up Information     D'Silva, Belva Crome, MD Follow up.   Specialty: Internal Medicine Contact information: [place] [address] [phone]                  The results of significant diagnostics from this hospitalization (including imaging, microbiology, ancillary and laboratory) are listed below for reference.    Significant Diagnostic Studies: CT ABDOMEN PELVIS WO CONTRAST  Result Date: 04/28/2021 CLINICAL DATA:  Acute back pain. EXAM: CT ABDOMEN AND PELVIS WITHOUT CONTRAST TECHNIQUE: Multidetector CT imaging of the abdomen and pelvis was performed following the standard protocol without IV contrast. RADIATION DOSE REDUCTION: This exam was performed according to the departmental dose-optimization program which includes automated exposure control, adjustment of the mA and/or kV according to patient size and/or use of iterative reconstruction technique. COMPARISON:  March 16, 2012. FINDINGS: Lower chest: No acute abnormality. Hepatobiliary: Status post cholecystectomy. No biliary dilatation is noted. Stable right hepatic cyst. Pancreas: Unremarkable. No pancreatic ductal dilatation or surrounding inflammatory changes. Spleen: Normal in size without focal abnormality. Adrenals/Urinary Tract: Adrenal glands appear normal. Bilateral renal cortical atrophy is noted. No hydronephrosis or renal obstruction is noted. Urinary  bladder is unremarkable. No renal or ureteral calculi are noted. Stomach/Bowel: The stomach appears normal. There is no evidence of bowel obstruction or inflammation. Status post appendectomy. Sigmoid diverticulosis is noted without inflammation. Vascular/Lymphatic: Aortic atherosclerosis. No enlarged abdominal or pelvic lymph nodes. Reproductive: Status post hysterectomy. No adnexal masses. Other: No abdominal wall hernia or abnormality. No abdominopelvic ascites. Musculoskeletal: There is interval development of superior endplate depression of A19 vertebral body concerning for fracture of indeterminate age. IMPRESSION: Sigmoid diverticulosis without inflammation. Interval development of mild superior endplate depression of F79 vertebral body concerning for fracture of indeterminate age. MRI may be performed for further evaluation. No other significant abnormality seen in the abdomen pelvis. Aortic Atherosclerosis (ICD10-I70.0). Electronically Signed   By: Marijo Conception M.D.   On: 04/28/2021 14:00   MR LUMBAR SPINE WO CONTRAST  Result Date: 05/04/2021 CLINICAL DATA:  Severe back pain with T12 compression fracture EXAM: MRI LUMBAR SPINE WITHOUT CONTRAST TECHNIQUE: Multiplanar, multisequence MR imaging of the lumbar spine was performed. No intravenous contrast was administered. COMPARISON:  Correlation made with the recent CT FINDINGS: Segmentation:  Standard. Alignment:  Minor endplate retropulsion at T12. dextrocurvature. Vertebrae: T12 compression fracture with less than 50% loss of height at the superior endplate. This is similar to the prior study. Marrow edema is present. Lumbar vertebral body heights are maintained. Mildly heterogeneous marrow signal likely reflects decreased mineralization. No suspicious osseous lesion. Conus medullaris and cauda equina: Conus extends to the L1-L2 level. Conus and cauda equina appear normal. Paraspinal and other soft tissues: Unremarkable. Disc levels: L1-L2:  Minimal  disc bulge.  No canal or foraminal stenosis. L2-L3: Minimal disc bulge eccentric to the right. No canal or foraminal stenosis. L3-L4:  Minimal disc bulge.  No canal or foraminal stenosis. L4-L5: Minimal disc bulge. Mild facet arthropathy. No canal or foraminal  stenosis. L5-S1: Disc space narrowing. Disc bulge with endplate osteophytic ridging. Mild facet arthropathy. No canal or foraminal stenosis. IMPRESSION: Acute to subacute T12 compression fracture with similar less than 50% loss of height and minor endplate retropulsion. No evidence to suggest this is a pathologic fracture. Mild lumbar spine degenerative changes. Electronically Signed   By: Macy Mis M.D.   On: 05/04/2021 13:41   CT L-SPINE NO CHARGE  Result Date: 04/28/2021 CLINICAL DATA:  Back pain beginning last night after standing up from a couch. EXAM: CT LUMBAR SPINE WITHOUT CONTRAST TECHNIQUE: Multidetector CT imaging of the lumbar spine was performed without intravenous contrast administration. Multiplanar CT image reconstructions were also generated. RADIATION DOSE REDUCTION: This exam was performed according to the departmental dose-optimization program which includes automated exposure control, adjustment of the mA and/or kV according to patient size and/or use of iterative reconstruction technique. COMPARISON:  Lumbar spine CT 11/23/2019 FINDINGS: Segmentation: 5 lumbar type vertebrae. Alignment: Slight right convex curvature of the upper lumbar spine. No significant listhesis. Vertebrae: T12 superior endplate compression fracture with 15% vertebral body height loss, new from 2021 and age-indeterminate but potentially recent. No lumbar spine fracture. No suspicious osseous lesion. Diffuse osteopenia. Paraspinal and other soft tissues: No acute abnormality in the paraspinal soft tissues. Intra-abdominal and pelvic contents reported separately. Disc levels: Moderate disc space narrowing at L5-S1 with preserved disc space heights elsewhere.  Mild-to-moderate lower lumbar facet arthrosis. No evidence of high-grade spinal canal or neural foraminal stenosis. IMPRESSION: 1. Mild T12 compression fracture, new from 2021 and of indeterminate acuity but potentially recent. MRI could be performed for further evaluation if clinically indicated. 2. No lumbar spine fracture. Electronically Signed   By: Logan Bores M.D.   On: 04/28/2021 14:16   IR KYPHO THORACIC WITH BONE BIOPSY  Result Date: 05/11/2021 CLINICAL DATA:  Osteoporotic T12 vertebral body fracture. Symptomatic, failed medical management. EXAM: VERTEBRAL AUGMENTATION WITH BALLOON KYPHOPLASTY OF T12 VERTEBRAL BODY COMPARISON:  MR L-spine, 05/04/2021.  CT L-spine, 04/28/2021. MEDICATIONS: As antibiotic prophylaxis, Ancef 2 gm IV was ordered pre-procedure and administered intravenously within 1 hour of incision. ANESTHESIA/SEDATION: Moderate (conscious) sedation was employed during this procedure. A total of Versed 4 mg and Fentanyl 200 mcg was administered intravenously. Moderate Sedation Time: 47 minutes. The patient's level of consciousness and vital signs were monitored continuously by radiology nursing throughout the procedure under my direct supervision. FLUOROSCOPY: 9.3 min (956 mGy) COMPLICATIONS: None immediate. PROCEDURE: The procedure, risks (including but not limited to bleeding, infection, organ damage), benefits, and alternatives were explained to the the patient and/or patient's representative . Questions regarding the procedure were encouraged and answered. The patient understands and consents to the procedure. The patient was placed prone on the fluoroscopic table. The skin overlying the thoracolumbar region was then prepped and draped in the usual sterile fashion. Maximal barrier sterile technique was utilized including caps, mask, sterile gowns, sterile gloves, sterile drape, hand hygiene and skin antiseptic. Intravenous Fentanyl and Versed were administered as conscious sedation  during continuous cardiorespiratory monitoring by the radiology RN. The pedicles at T12 vertebral body was then infiltrated with 1% lidocaine followed by the advancement of a Kyphon trocar needle transpedicular at the LEFT than RIGHT pedicle into the posterior one-third of the vertebral body. Subsequently, the osteo drill was advanced to the anterior third of the vertebral body. The osteo drill was retracted. Through the working cannula, a Kyphon inflatable bone tamp 15 x 3 was advanced and positioned with the distal marker approximately 5  mm from the anterior aspect of the cortex. Appropriate positioning was confirmed on the AP projection. At this time, the balloons were expanded using contrast via a Kyphon inflation syringe device via micro tubing. Inflations was continued until there was near apposition with the superior end plate. At this time, methylmethacrylate (PMMA) mixture was reconstituted in the Kyphon bone mixing device system. This was then loaded into the delivery mechanism, attached to Kyphon bone filler. The balloon was deflated and removed followed by the instillation of methylmethacrylate mixture with excellent filling in the AP and lateral projections. Approximately 8 mL PMMA was administered. No extravasation was noted in the disk spaces or posteriorly into the spinal canal. A trace volume of epidural venous contamination was seen. The working cannula and the bone filler was then retrieved and removed. Hemostasis was achieved with manual compression. The patient tolerated the procedure well without immediate postprocedural complication. IMPRESSION: 1. Successful T12 vertebral body augmentation using balloon kyphoplasty, using a bipedicular approach as above. 2. A trace volume of epidural venous contamination of PMMA was seen, tracking along the LEFT paravertebral space. PLAN: The patient has suffered a fracture of the T12 vertebral body. It is recommended that patients aged 56 years or older be  evaluated for possible testing or treatment of osteoporosis. A copy of this procedure report is sent to the patient's referring physician Per CMS PQRS reporting requirements (PQRS Measure 24): Given the patient's age of greater than 62 and the fracture site (hip, distal radius, or spine), the patient should be tested for osteoporosis using DXA, and the appropriate treatment considered based on the DXA results. Michaelle Birks, MD [facility] Electronically Signed   By: Michaelle Birks M.D.   On: 05/11/2021 16:30    Microbiology: No results found for this or any previous visit (from the past 240 hour(s)).   Labs: Basic Metabolic Panel: Recent Labs  Lab 05/06/21 0745 05/07/21 1610 05/08/21 0616 05/09/21 0642 05/10/21 0547 05/11/21 0512  NA 132* 134* 135 136 136 137  K 4.0 4.0 3.5 3.7 4.1 4.1  CL 99 102 102 99 101 102  CO2 27 27 28 28 30 29   GLUCOSE 99 92 91 127* 96 98  BUN 18 16 13 12 12 16   CREATININE 0.95 0.98 0.99 1.11* 1.05* 1.02*  CALCIUM 8.7* 8.7* 8.7* 9.0 9.0 8.7*  MG 1.8  --   --  1.8  --  2.1  PHOS 3.6  --   --  3.3  --  3.5   Liver Function Tests: Recent Labs  Lab 05/06/21 0745 05/09/21 0642 05/11/21 0512  AST 15 17 17   ALT 11 13 11   ALKPHOS 73 95 113  BILITOT 0.3 0.3 0.4  PROT 6.0* 6.2* 6.2*  ALBUMIN 2.8* 3.0* 3.1*   No results for input(s): LIPASE, AMYLASE in the last 168 hours. No results for input(s): AMMONIA in the last 168 hours. CBC: Recent Labs  Lab 05/06/21 0745 05/07/21 0624 05/08/21 0616 05/09/21 0642 05/10/21 0547 05/11/21 0512  WBC 9.2 8.1 8.3 8.9 8.2 9.4  NEUTROABS 5.9  --   --  5.9  --  6.2  HGB 10.6* 9.6* 10.2* 10.8* 10.6* 11.4*  HCT 34.0* 30.5* 32.3* 35.2* 34.7* 36.9  MCV 97.7 96.5 95.6 97.5 97.2 97.1  PLT 316 314 354 393 388 378   Cardiac Enzymes: No results for input(s): CKTOTAL, CKMB, CKMBINDEX, TROPONINI in the last 168 hours. BNP: BNP (last 3 results) No results for input(s):  BNP  in the last 8760 hours.  ProBNP (last 3 results) No results for input(s): PROBNP in the last 8760 hours.  CBG: No results for input(s): GLUCAP in the last 168 hours.     Signed:  Mercy Riding MD.  [facility] 05/12/2021, 11:01 AM  Patient was discharged by Dr. Florene Glen on 05/11/2021 pending SNF bed. No new changes overnight or this morning.

## 2021-05-12 NOTE — Progress Notes (Signed)
Christina Fry to be D/C'd Skilled nursing facility per MD order.  Discussed with the patient and all questions fully answered. ? ?VSS, Skin clean, dry and intact without evidence of skin break down, no evidence of skin tears noted. ?IV catheter discontinued intact. Site without signs and symptoms of complications. Dressing and pressure applied. ? ?An After Visit Summary was printed and given to [facility]. Prescription and signed DNR form sent with transport. ? ?Patient instructed to return to ED, call 911, or call MD for any changes in condition.  ? ?Patient escorted via stretcher, and D/C to [facility] via non emergency ambulance. ? ?Manuella Ghazi ?05/12/2021 3:36 PM  ?

## 2021-06-14 ENCOUNTER — Telehealth: Payer: Self-pay | Admitting: Allergy & Immunology

## 2021-06-14 NOTE — Telephone Encounter (Signed)
Faxed renewal of authorization request to [facility], [phone].  ?

## 2021-06-21 ENCOUNTER — Ambulatory Visit (INDEPENDENT_AMBULATORY_CARE_PROVIDER_SITE_OTHER): Payer: No Typology Code available for payment source

## 2021-06-21 DIAGNOSIS — J309 Allergic rhinitis, unspecified: Secondary | ICD-10-CM | POA: Diagnosis not present

## 2021-06-28 ENCOUNTER — Ambulatory Visit (INDEPENDENT_AMBULATORY_CARE_PROVIDER_SITE_OTHER): Payer: No Typology Code available for payment source

## 2021-06-28 DIAGNOSIS — J309 Allergic rhinitis, unspecified: Secondary | ICD-10-CM | POA: Diagnosis not present

## 2021-07-07 ENCOUNTER — Ambulatory Visit (INDEPENDENT_AMBULATORY_CARE_PROVIDER_SITE_OTHER): Payer: No Typology Code available for payment source | Admitting: *Deleted

## 2021-07-07 DIAGNOSIS — J309 Allergic rhinitis, unspecified: Secondary | ICD-10-CM | POA: Diagnosis not present

## 2021-07-14 ENCOUNTER — Emergency Department (HOSPITAL_COMMUNITY): Payer: No Typology Code available for payment source

## 2021-07-14 ENCOUNTER — Encounter (HOSPITAL_COMMUNITY)
Admission: EM | Disposition: A | Discharge status: Skilled Nursing Facility | Payer: Self-pay | Source: Home / Self Care | Attending: Internal Medicine

## 2021-07-14 ENCOUNTER — Other Ambulatory Visit: Payer: Self-pay

## 2021-07-14 ENCOUNTER — Inpatient Hospital Stay (HOSPITAL_COMMUNITY): Payer: No Typology Code available for payment source

## 2021-07-14 ENCOUNTER — Inpatient Hospital Stay (HOSPITAL_COMMUNITY)
Admission: EM | Admit: 2021-07-14 | Discharge: 2021-07-27 | DRG: 480 | Disposition: A | Discharge status: Skilled Nursing Facility | Payer: No Typology Code available for payment source | Attending: Internal Medicine | Admitting: Internal Medicine

## 2021-07-14 ENCOUNTER — Inpatient Hospital Stay (HOSPITAL_COMMUNITY): Payer: No Typology Code available for payment source | Admitting: Anesthesiology

## 2021-07-14 ENCOUNTER — Encounter (HOSPITAL_COMMUNITY): Payer: Self-pay

## 2021-07-14 DIAGNOSIS — E8809 Other disorders of plasma-protein metabolism, not elsewhere classified: Secondary | ICD-10-CM | POA: Diagnosis present

## 2021-07-14 DIAGNOSIS — E039 Hypothyroidism, unspecified: Secondary | ICD-10-CM | POA: Diagnosis present

## 2021-07-14 DIAGNOSIS — R262 Difficulty in walking, not elsewhere classified: Secondary | ICD-10-CM | POA: Diagnosis not present

## 2021-07-14 DIAGNOSIS — K2901 Acute gastritis with bleeding: Secondary | ICD-10-CM | POA: Diagnosis present

## 2021-07-14 DIAGNOSIS — S72002A Fracture of unspecified part of neck of left femur, initial encounter for closed fracture: Secondary | ICD-10-CM | POA: Diagnosis present

## 2021-07-14 DIAGNOSIS — E669 Obesity, unspecified: Secondary | ICD-10-CM | POA: Diagnosis present

## 2021-07-14 DIAGNOSIS — R531 Weakness: Secondary | ICD-10-CM

## 2021-07-14 DIAGNOSIS — M353 Polymyalgia rheumatica: Secondary | ICD-10-CM | POA: Diagnosis present

## 2021-07-14 DIAGNOSIS — F419 Anxiety disorder, unspecified: Secondary | ICD-10-CM | POA: Diagnosis present

## 2021-07-14 DIAGNOSIS — S72142A Displaced intertrochanteric fracture of left femur, initial encounter for closed fracture: Secondary | ICD-10-CM | POA: Diagnosis present

## 2021-07-14 DIAGNOSIS — E1122 Type 2 diabetes mellitus with diabetic chronic kidney disease: Secondary | ICD-10-CM | POA: Diagnosis present

## 2021-07-14 DIAGNOSIS — K2970 Gastritis, unspecified, without bleeding: Secondary | ICD-10-CM

## 2021-07-14 DIAGNOSIS — Z66 Do not resuscitate: Secondary | ICD-10-CM | POA: Diagnosis present

## 2021-07-14 DIAGNOSIS — J4520 Mild intermittent asthma, uncomplicated: Secondary | ICD-10-CM | POA: Diagnosis present

## 2021-07-14 DIAGNOSIS — Z885 Allergy status to narcotic agent status: Secondary | ICD-10-CM

## 2021-07-14 DIAGNOSIS — E119 Type 2 diabetes mellitus without complications: Secondary | ICD-10-CM

## 2021-07-14 DIAGNOSIS — W1830XA Fall on same level, unspecified, initial encounter: Secondary | ICD-10-CM | POA: Diagnosis present

## 2021-07-14 DIAGNOSIS — Z7901 Long term (current) use of anticoagulants: Secondary | ICD-10-CM | POA: Diagnosis not present

## 2021-07-14 DIAGNOSIS — W19XXXA Unspecified fall, initial encounter: Principal | ICD-10-CM

## 2021-07-14 DIAGNOSIS — M797 Fibromyalgia: Secondary | ICD-10-CM | POA: Diagnosis present

## 2021-07-14 DIAGNOSIS — K2971 Gastritis, unspecified, with bleeding: Secondary | ICD-10-CM | POA: Diagnosis not present

## 2021-07-14 DIAGNOSIS — K449 Diaphragmatic hernia without obstruction or gangrene: Secondary | ICD-10-CM

## 2021-07-14 DIAGNOSIS — Z20822 Contact with and (suspected) exposure to covid-19: Secondary | ICD-10-CM | POA: Diagnosis present

## 2021-07-14 DIAGNOSIS — I10 Essential (primary) hypertension: Secondary | ICD-10-CM | POA: Diagnosis not present

## 2021-07-14 DIAGNOSIS — I129 Hypertensive chronic kidney disease with stage 1 through stage 4 chronic kidney disease, or unspecified chronic kidney disease: Secondary | ICD-10-CM | POA: Diagnosis present

## 2021-07-14 DIAGNOSIS — M4854XA Collapsed vertebra, not elsewhere classified, thoracic region, initial encounter for fracture: Secondary | ICD-10-CM | POA: Diagnosis present

## 2021-07-14 DIAGNOSIS — D631 Anemia in chronic kidney disease: Secondary | ICD-10-CM | POA: Diagnosis present

## 2021-07-14 DIAGNOSIS — K2100 Gastro-esophageal reflux disease with esophagitis, without bleeding: Secondary | ICD-10-CM | POA: Diagnosis not present

## 2021-07-14 DIAGNOSIS — K921 Melena: Secondary | ICD-10-CM | POA: Diagnosis present

## 2021-07-14 DIAGNOSIS — K317 Polyp of stomach and duodenum: Secondary | ICD-10-CM | POA: Diagnosis present

## 2021-07-14 DIAGNOSIS — Z993 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: Secondary | ICD-10-CM | POA: Diagnosis not present

## 2021-07-14 DIAGNOSIS — M25512 Pain in left shoulder: Secondary | ICD-10-CM | POA: Diagnosis present

## 2021-07-14 DIAGNOSIS — S72002S Fracture of unspecified part of neck of left femur, sequela: Secondary | ICD-10-CM

## 2021-07-14 DIAGNOSIS — I2699 Other pulmonary embolism without acute cor pulmonale: Secondary | ICD-10-CM | POA: Diagnosis present

## 2021-07-14 DIAGNOSIS — E785 Hyperlipidemia, unspecified: Secondary | ICD-10-CM | POA: Diagnosis present

## 2021-07-14 DIAGNOSIS — S22080A Wedge compression fracture of T11-T12 vertebra, initial encounter for closed fracture: Secondary | ICD-10-CM | POA: Diagnosis present

## 2021-07-14 DIAGNOSIS — D62 Acute posthemorrhagic anemia: Secondary | ICD-10-CM | POA: Diagnosis not present

## 2021-07-14 DIAGNOSIS — Z86711 Personal history of pulmonary embolism: Secondary | ICD-10-CM

## 2021-07-14 DIAGNOSIS — E43 Unspecified severe protein-calorie malnutrition: Secondary | ICD-10-CM | POA: Insufficient documentation

## 2021-07-14 DIAGNOSIS — N1831 Chronic kidney disease, stage 3a: Secondary | ICD-10-CM | POA: Diagnosis present

## 2021-07-14 DIAGNOSIS — E538 Deficiency of other specified B group vitamins: Secondary | ICD-10-CM | POA: Diagnosis present

## 2021-07-14 DIAGNOSIS — Z886 Allergy status to analgesic agent status: Secondary | ICD-10-CM

## 2021-07-14 DIAGNOSIS — M25562 Pain in left knee: Secondary | ICD-10-CM | POA: Diagnosis not present

## 2021-07-14 DIAGNOSIS — Z79899 Other long term (current) drug therapy: Secondary | ICD-10-CM

## 2021-07-14 DIAGNOSIS — Z6831 Body mass index (BMI) 31.0-31.9, adult: Secondary | ICD-10-CM

## 2021-07-14 DIAGNOSIS — K219 Gastro-esophageal reflux disease without esophagitis: Secondary | ICD-10-CM | POA: Diagnosis present

## 2021-07-14 DIAGNOSIS — Z8711 Personal history of peptic ulcer disease: Secondary | ICD-10-CM

## 2021-07-14 DIAGNOSIS — Z79890 Hormone replacement therapy (postmenopausal): Secondary | ICD-10-CM

## 2021-07-14 DIAGNOSIS — R42 Dizziness and giddiness: Secondary | ICD-10-CM | POA: Diagnosis present

## 2021-07-14 DIAGNOSIS — S72002P Fracture of unspecified part of neck of left femur, subsequent encounter for closed fracture with malunion: Secondary | ICD-10-CM | POA: Diagnosis not present

## 2021-07-14 DIAGNOSIS — E871 Hypo-osmolality and hyponatremia: Secondary | ICD-10-CM | POA: Diagnosis present

## 2021-07-14 DIAGNOSIS — Z888 Allergy status to other drugs, medicaments and biological substances status: Secondary | ICD-10-CM

## 2021-07-14 DIAGNOSIS — D131 Benign neoplasm of stomach: Secondary | ICD-10-CM

## 2021-07-14 DIAGNOSIS — K2990 Gastroduodenitis, unspecified, without bleeding: Secondary | ICD-10-CM | POA: Diagnosis not present

## 2021-07-14 DIAGNOSIS — I2782 Chronic pulmonary embolism: Secondary | ICD-10-CM | POA: Diagnosis not present

## 2021-07-14 HISTORY — PX: FEMUR IM NAIL: SHX1597

## 2021-07-14 LAB — CBC
HCT: 41.6 % (ref 36.0–46.0)
Hemoglobin: 12.9 g/dL (ref 12.0–15.0)
MCH: 30.2 pg (ref 26.0–34.0)
MCHC: 31 g/dL (ref 30.0–36.0)
MCV: 97.4 fL (ref 80.0–100.0)
Platelets: 273 10*3/uL (ref 150–400)
RBC: 4.27 MIL/uL (ref 3.87–5.11)
RDW: 15.4 % (ref 11.5–15.5)
WBC: 18.2 10*3/uL — ABNORMAL HIGH (ref 4.0–10.5)
nRBC: 0 % (ref 0.0–0.2)

## 2021-07-14 LAB — RESP PANEL BY RT-PCR (FLU A&B, COVID) ARPGX2
Influenza A by PCR: NEGATIVE
Influenza B by PCR: NEGATIVE
SARS Coronavirus 2 by RT PCR: NEGATIVE

## 2021-07-14 LAB — COMPREHENSIVE METABOLIC PANEL
ALT: 18 U/L (ref 0–44)
AST: 26 U/L (ref 15–41)
Albumin: 3.4 g/dL — ABNORMAL LOW (ref 3.5–5.0)
Alkaline Phosphatase: 93 U/L (ref 38–126)
Anion gap: 9 (ref 5–15)
BUN: 11 mg/dL (ref 8–23)
CO2: 27 mmol/L (ref 22–32)
Calcium: 9.6 mg/dL (ref 8.9–10.3)
Chloride: 104 mmol/L (ref 98–111)
Creatinine, Ser: 1.05 mg/dL — ABNORMAL HIGH (ref 0.44–1.00)
GFR, Estimated: 53 mL/min — ABNORMAL LOW (ref >60)
Glucose, Bld: 132 mg/dL — ABNORMAL HIGH (ref 70–99)
Potassium: 4.1 mmol/L (ref 3.5–5.1)
Sodium: 140 mmol/L (ref 135–145)
Total Bilirubin: 0.5 mg/dL (ref 0.3–1.2)
Total Protein: 6.7 g/dL (ref 6.5–8.1)

## 2021-07-14 LAB — PROTIME-INR
INR: 1 (ref 0.8–1.2)
Prothrombin Time: 12.6 seconds (ref 11.4–15.2)

## 2021-07-14 LAB — ETHANOL: Alcohol, Ethyl (B): <10 mg/dL (ref <10)

## 2021-07-14 LAB — I-STAT CHEM 8, ED
BUN: 15 mg/dL (ref 8–23)
Calcium, Ion: 1.15 mmol/L (ref 1.15–1.40)
Chloride: 103 mmol/L (ref 98–111)
Creatinine, Ser: 1 mg/dL (ref 0.44–1.00)
Glucose, Bld: 129 mg/dL — ABNORMAL HIGH (ref 70–99)
HCT: 43 % (ref 36.0–46.0)
Hemoglobin: 14.6 g/dL (ref 12.0–15.0)
Potassium: 4.6 mmol/L (ref 3.5–5.1)
Sodium: 139 mmol/L (ref 135–145)
TCO2: 28 mmol/L (ref 22–32)

## 2021-07-14 LAB — SAMPLE TO BLOOD BANK

## 2021-07-14 LAB — GLUCOSE, CAPILLARY: Glucose-Capillary: 97 mg/dL (ref 70–99)

## 2021-07-14 LAB — LACTIC ACID, PLASMA: Lactic Acid, Venous: 2.3 mmol/L (ref 0.5–1.9)

## 2021-07-14 LAB — CK: Total CK: 84 U/L (ref 38–234)

## 2021-07-14 IMAGING — CT CT HEAD W/O CM
4 series · 15 of 47 positions shown, 17 images · non-contrast
Comparison: Head CT [DATE], cervical spine CT [DATE], and
head MRI [DATE].

CLINICAL DATA: Head trauma, minor (Age >= 65y); Neck trauma (Age >=
65y). Fall.



[Series 3: head wo · axial · 0.42mm/px · z∈[-149,-29]mm · 7 of 34 slices shown, 9 images]
[im 5/34  brain]
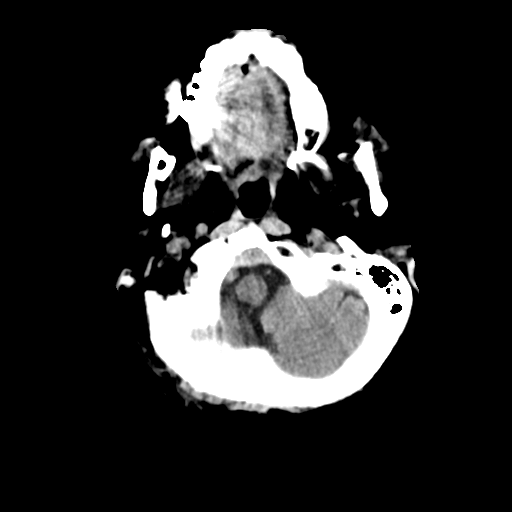
[im 5/34  bone]
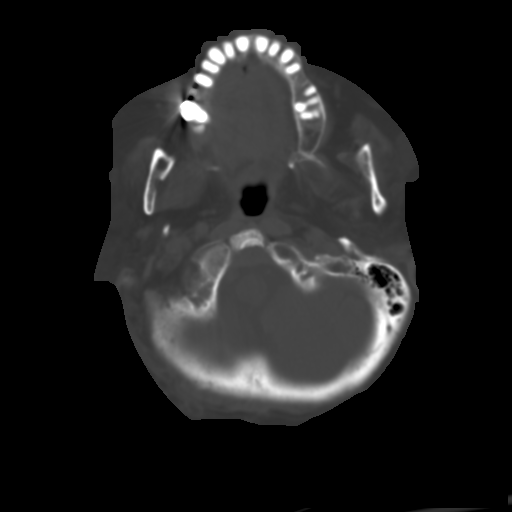
[im 9/34  brain]
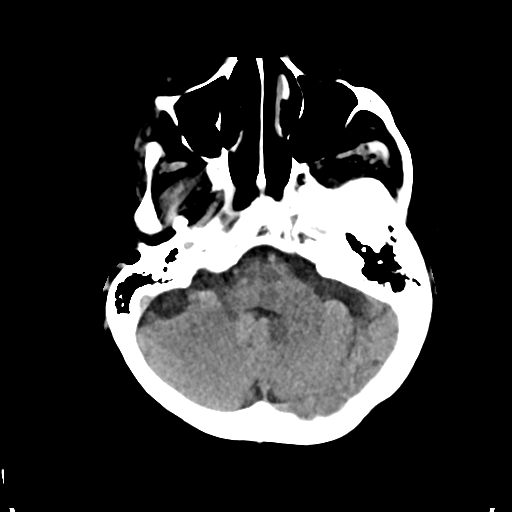
[im 13/34  brain]
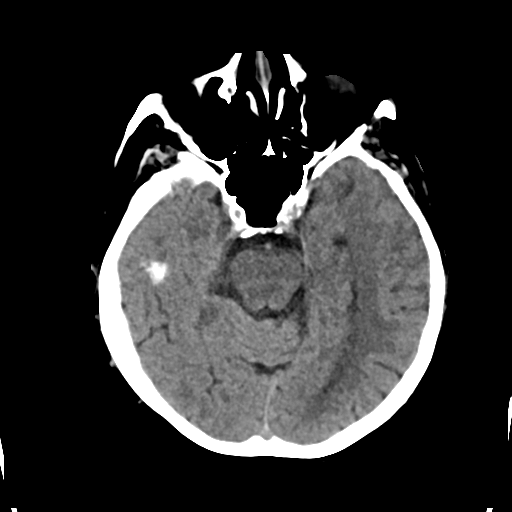
[im 17/34  brain]
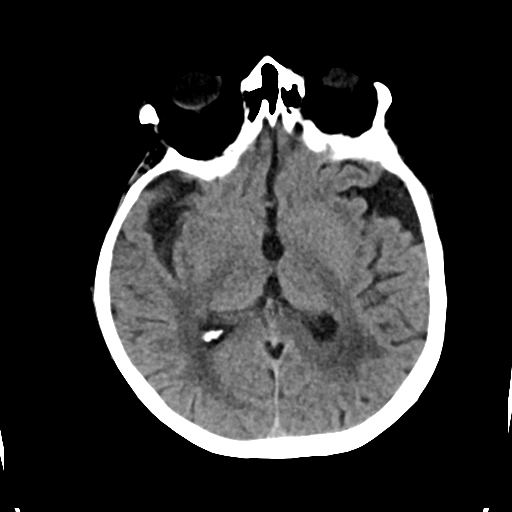
[im 21/34  brain]
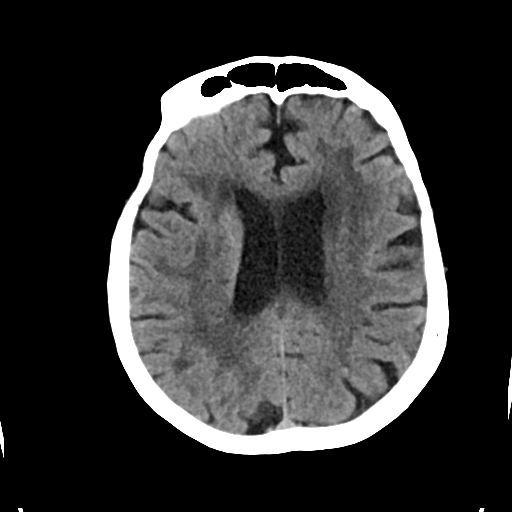
[im 21/34  bone]
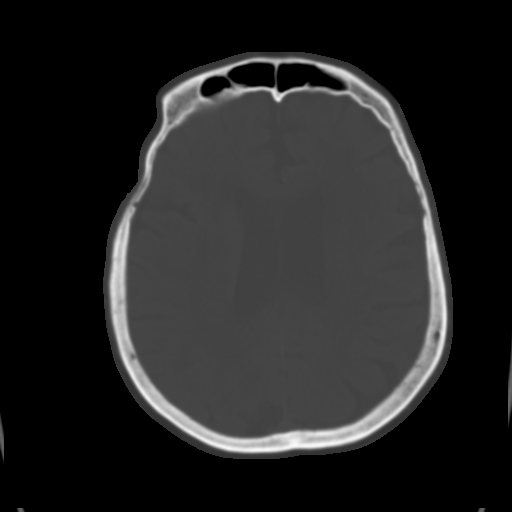
[im 25/34  brain]
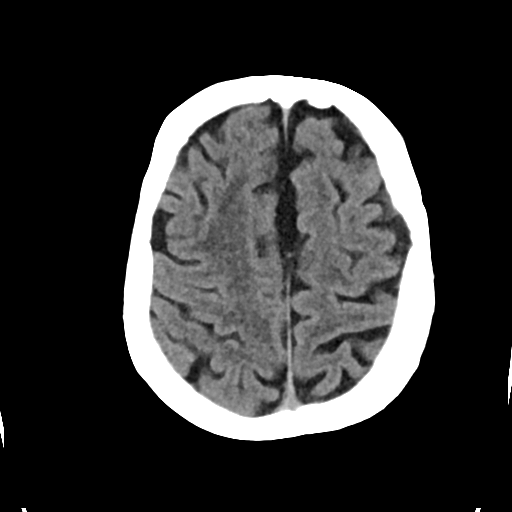
[im 29/34  brain]
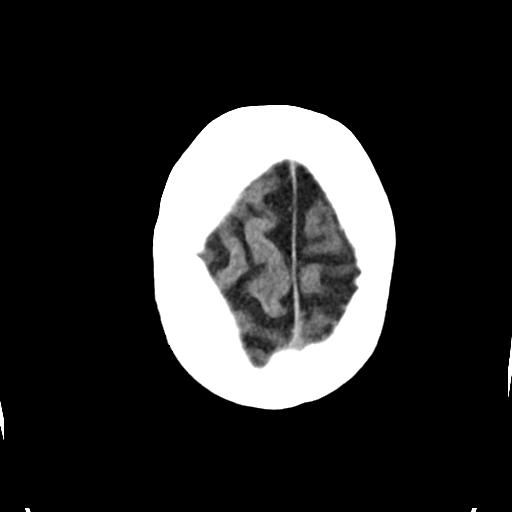

[Series 4: head bone · axial · 0.42mm/px · z∈[-153,-137]mm · 2 of 85 slices shown]
[im 9/85  bone]
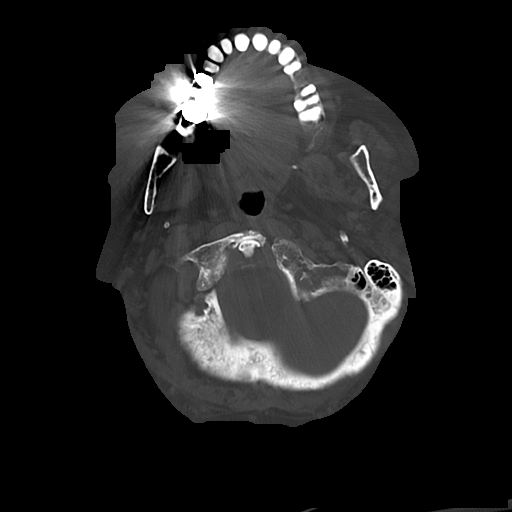
[im 17/85  bone]
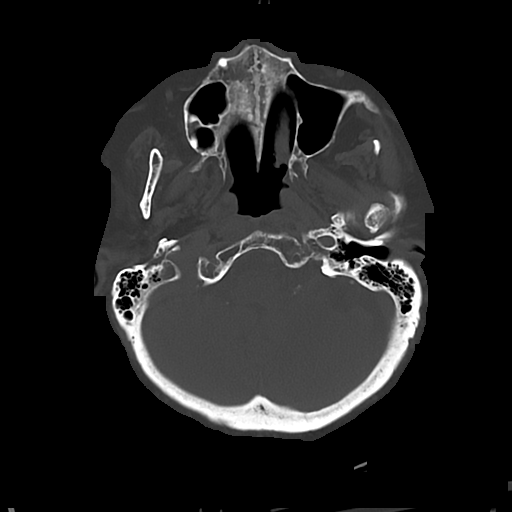

[Series 5: cor soft · coronal · 0.33mm/px · 3 of 64 slices shown]
[im 22/64  brain]
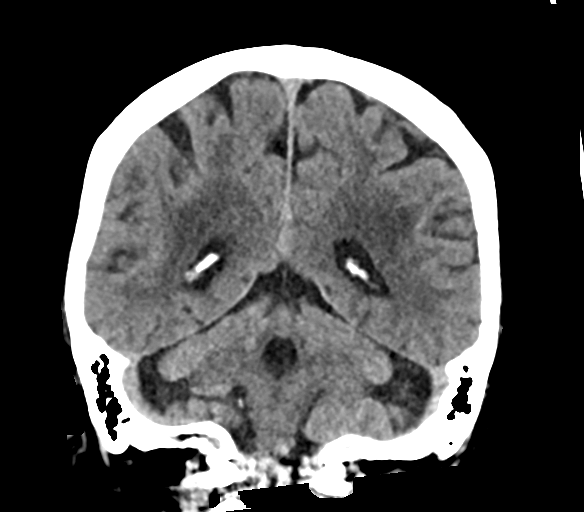
[im 29/64  brain]
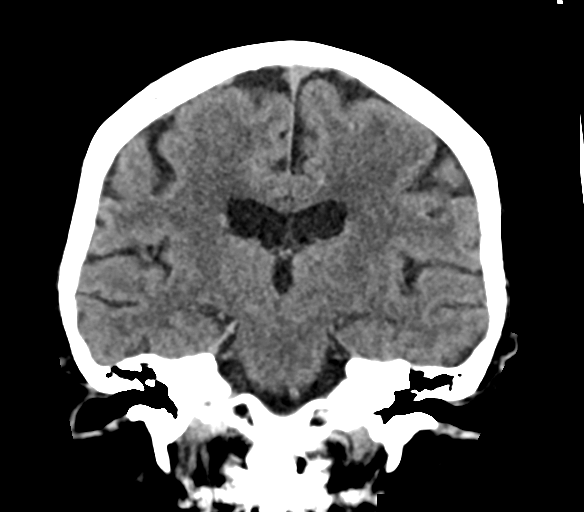
[im 36/64  brain]
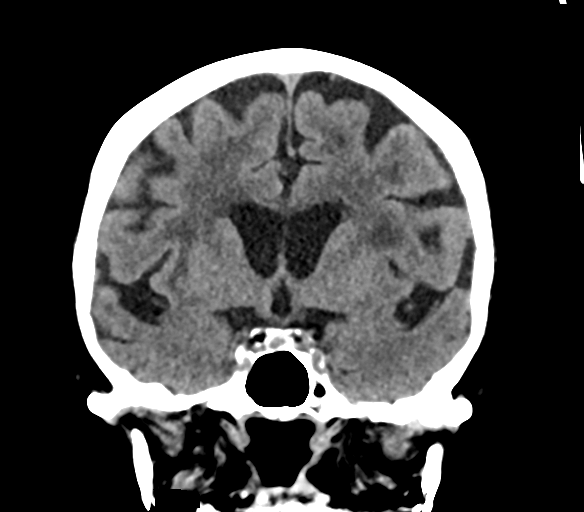

[Series 6: sag soft · sagittal · 0.35mm/px · 3 of 53 slices shown]
[im 18/53  brain]
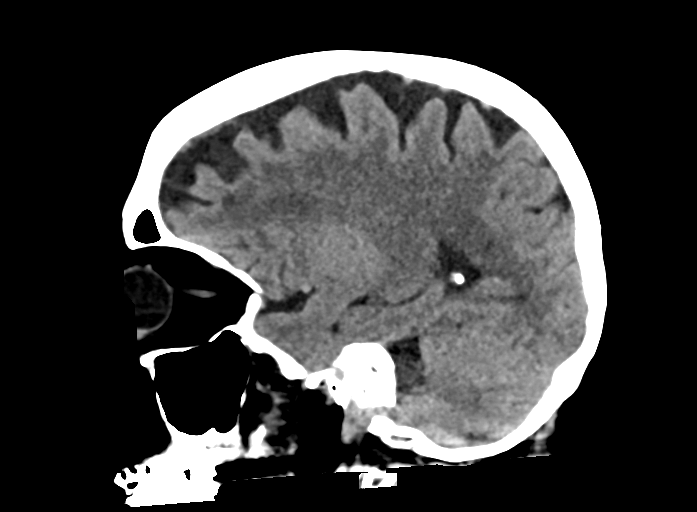
[im 27/53  brain]
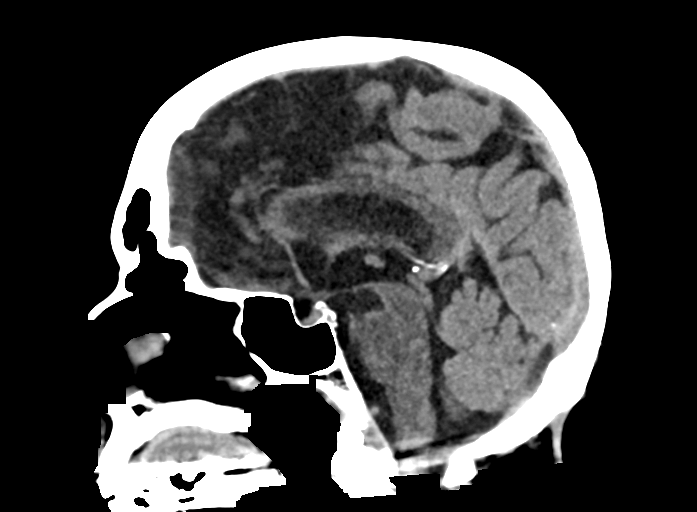
[im 35/53  brain]
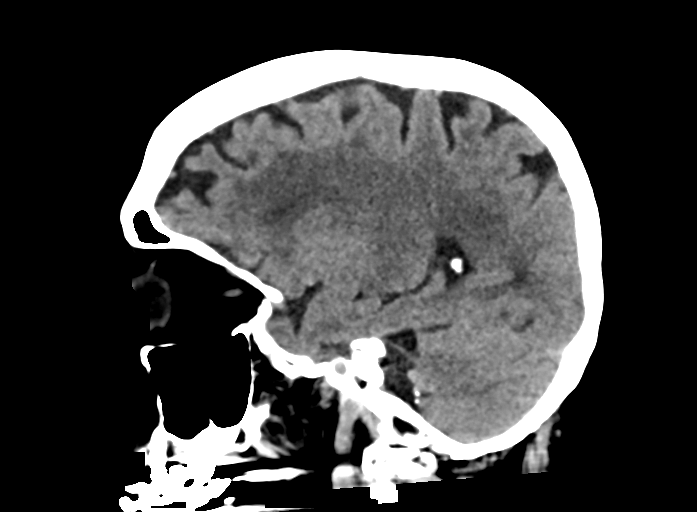

[15 of 47 positions shown; findings below may reference images not displayed]

FINDINGS: CT HEAD FINDINGS

Brain: There is no evidence of an acute infarct, intracranial
hemorrhage, mass, midline shift, or extra-axial fluid collection.
Mild cerebral atrophy is within normal limits for age. Patchy and
confluent hypodensities in the cerebral white matter bilaterally
have likely mildly progressed and are nonspecific but compatible
with extensive chronic small vessel ischemic disease.

Vascular: Calcified atherosclerosis at the skull base. No hyperdense
vessel.

Skull: No acute fracture or suspicious osseous lesion.

Sinuses/Orbits: Remote bilateral orbital fractures. Bilateral
cataract extraction. Mild mucosal thickening in the right sphenoid
sinus. Clear mastoid air cells.

Other: None.

CT CERVICAL SPINE FINDINGS

Alignment: Unchanged trace anterolisthesis of C3 on C4 and C7 on T1.

Skull base and vertebrae: No acute fracture or suspicious osseous
lesion. Unchanged median C1-2 arthropathy.

Soft tissues and spinal canal: No prevertebral fluid or swelling. No
visible canal hematoma.

Disc levels: Mild mid to lower cervical disc degeneration.
Asymmetrically advanced left-sided facet arthrosis, greatest at C2-3
and C3-4.

Upper chest: Clear lung apices.

Other: None.
IMPRESSION: 1. No evidence of acute intracranial abnormality.
2. Extensive chronic small vessel ischemic disease.
3. No acute fracture or traumatic subluxation in the cervical spine.

## 2021-07-14 IMAGING — CT CT L SPINE W/O CM
3 of 5 series · 15 of 33 positions shown, 18 images · non-contrast
Comparison: [DATE]

CLINICAL DATA: Back trauma, no prior imaging (Age >= 16y); fall
with low back pain



[Series 5: l spine soft · axial · 0.37mm/px · z∈[+1224,+1418]mm · 9 of 115 slices shown, 12 images]
[im 9/115  soft-tissue]
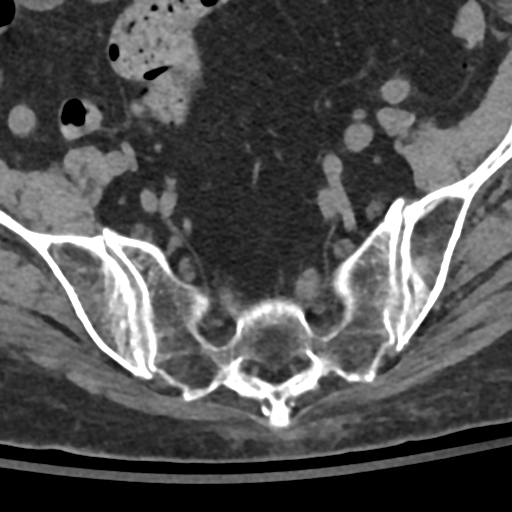
[im 9/115  bone]
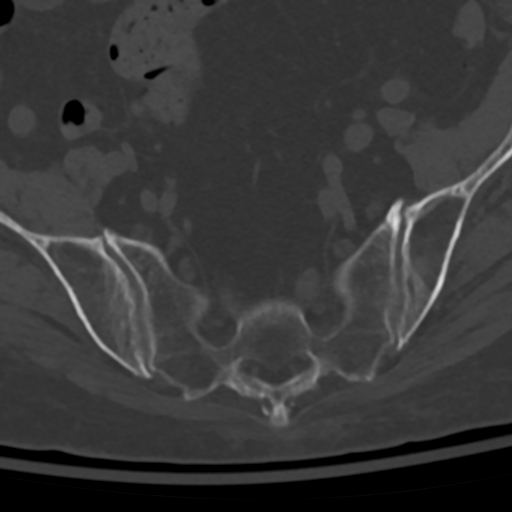
[im 27/115  bone]
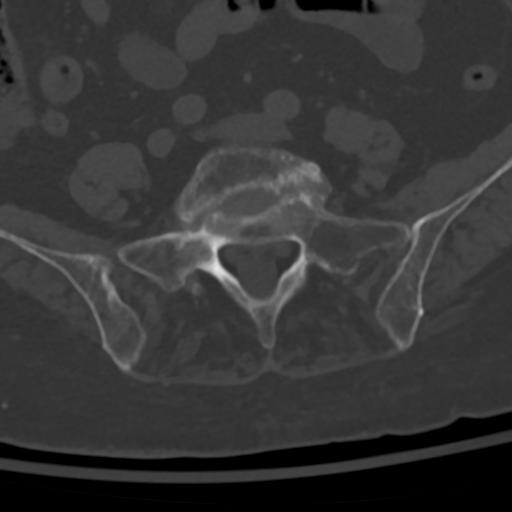
[im 36/115  bone]
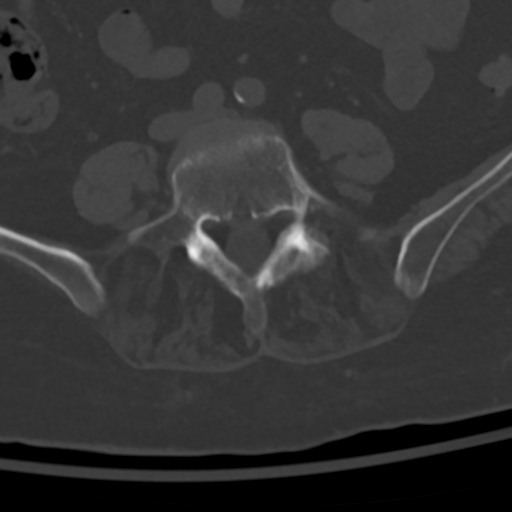
[im 44/115  bone]
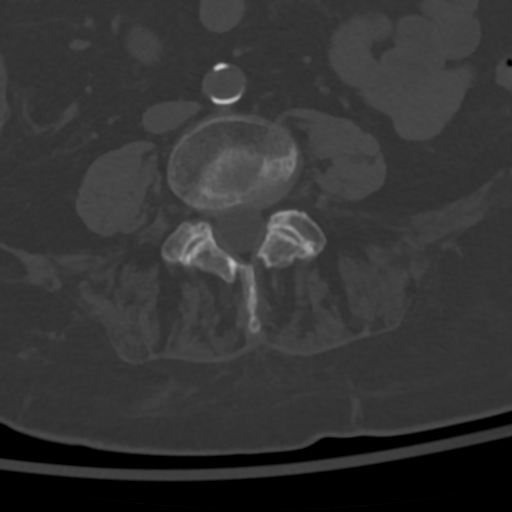
[im 62/115  soft-tissue]
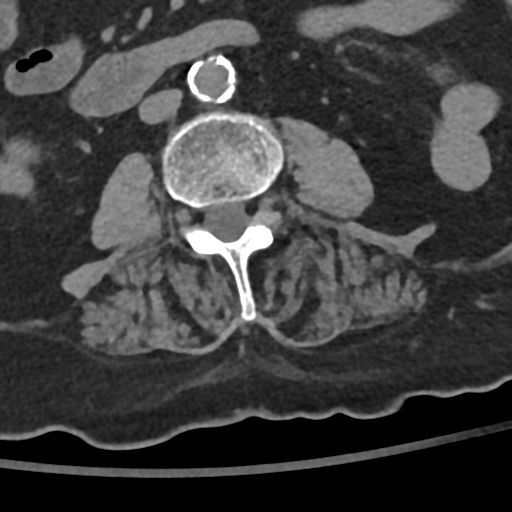
[im 62/115  bone]
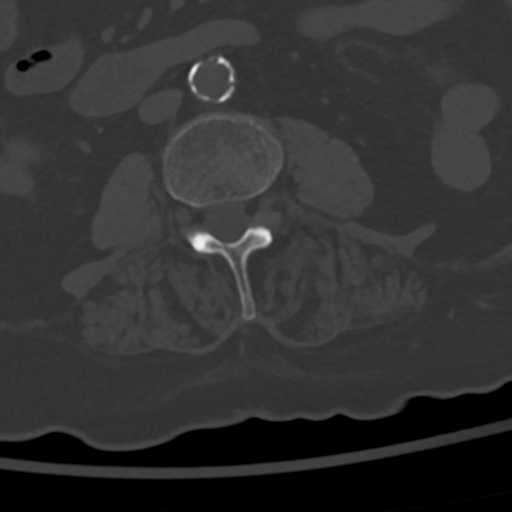
[im 71/115  bone]
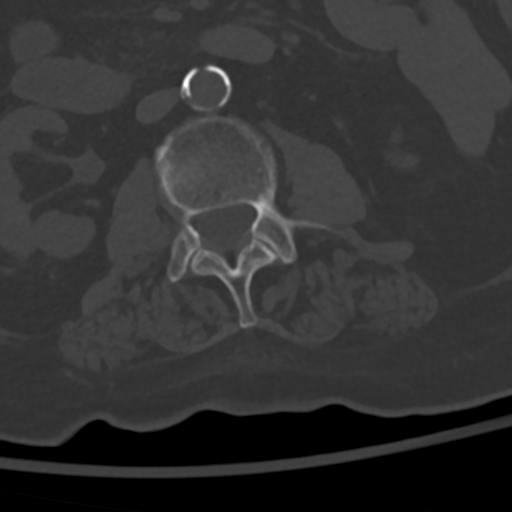
[im 79/115  bone]
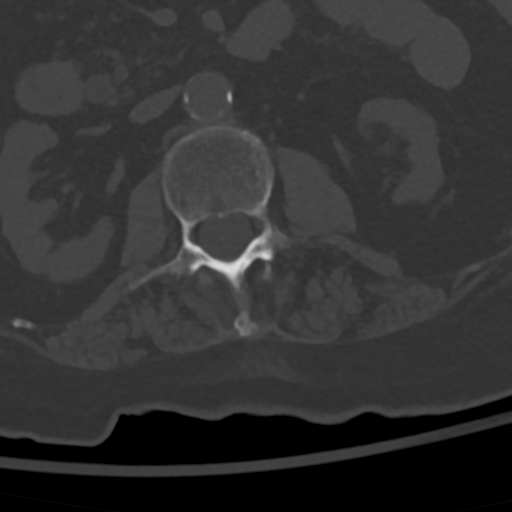
[im 97/115  bone]
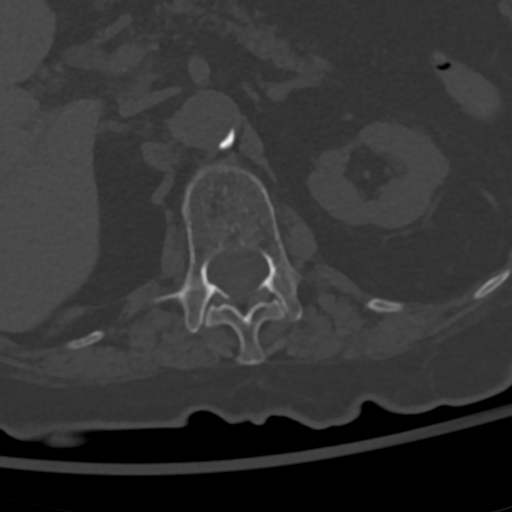
[im 106/115  soft-tissue]
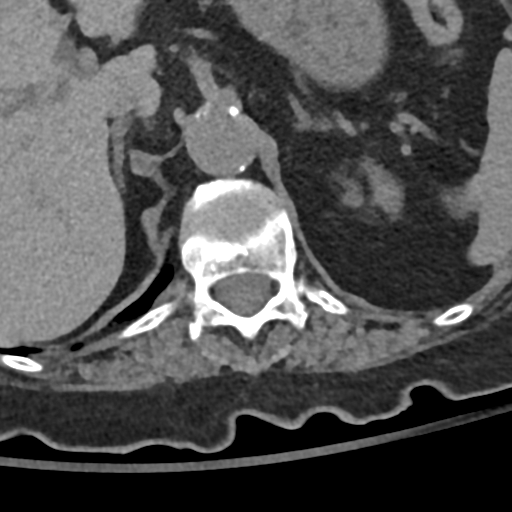
[im 106/115  bone]
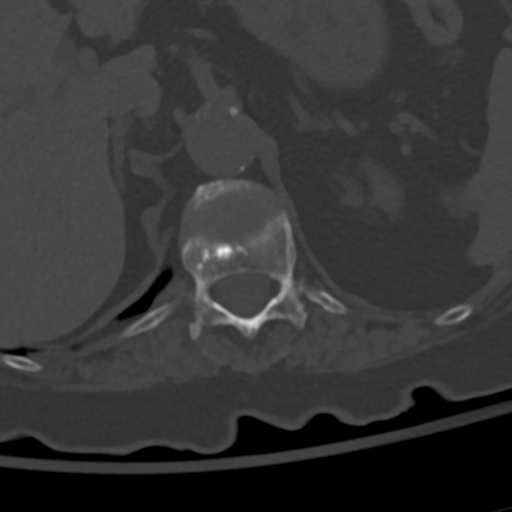

[Series 6: sag bone · sagittal · 0.40mm/px · 5 of 97 slices shown]
[im 17/97  bone]
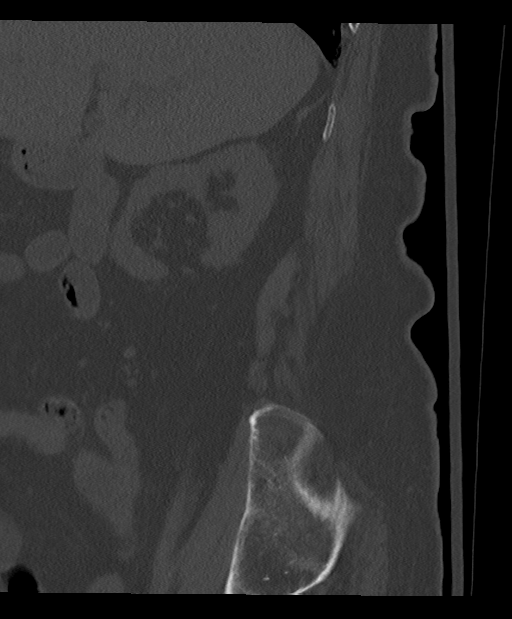
[im 33/97  bone]
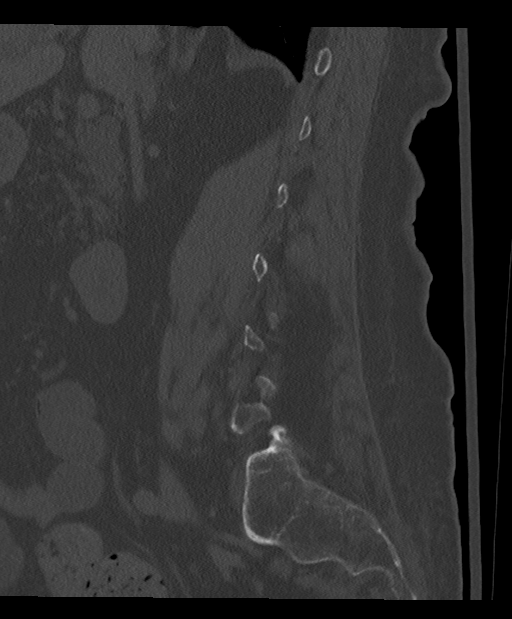
[im 49/97  bone]
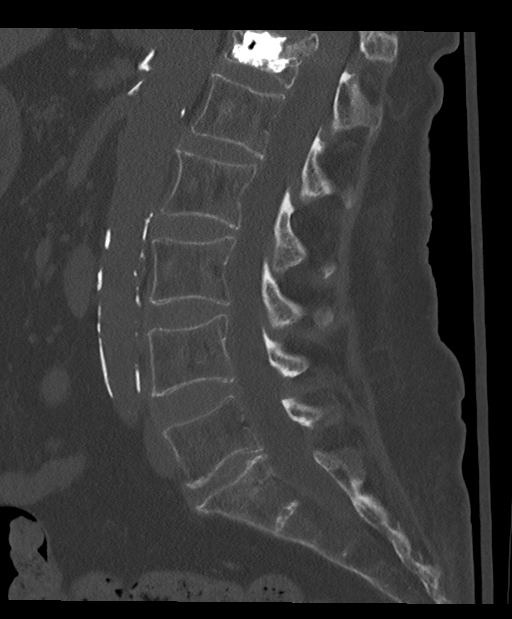
[im 65/97  bone]
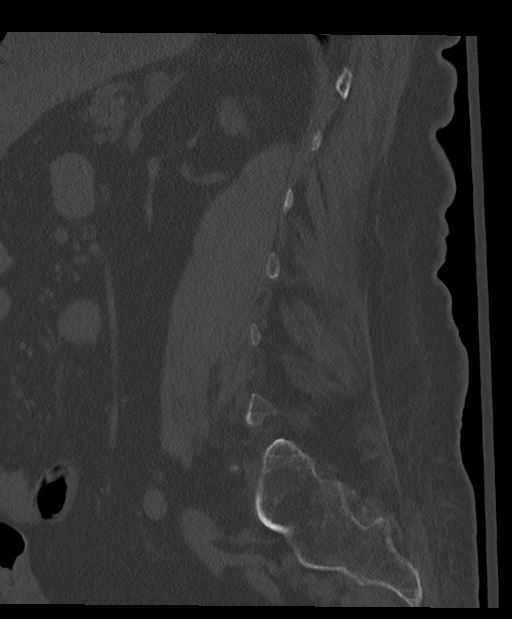
[im 81/97  bone]
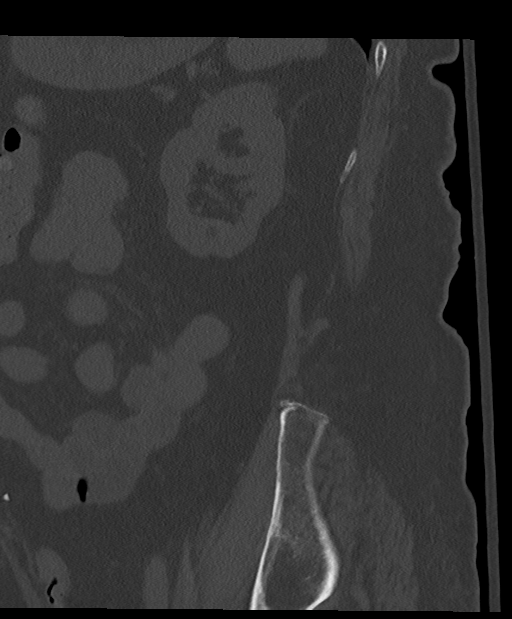

[Series 7: cor bone · coronal · 0.45mm/px · 1 of 64 slices shown]
[im 32/64  bone]
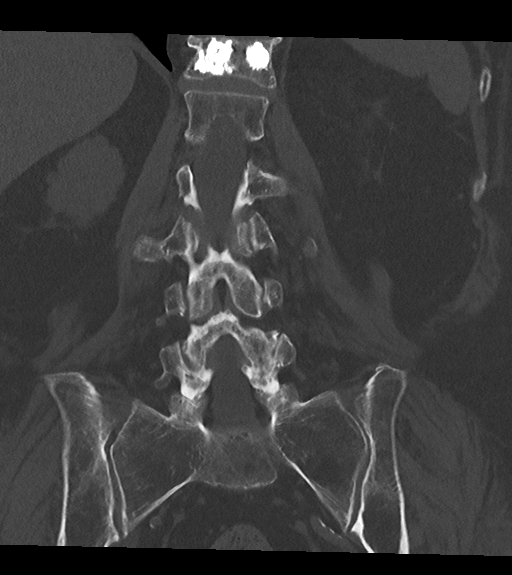

[15 of 33 positions shown; findings below may reference images not displayed]

FINDINGS: Segmentation: 5 lumbar type vertebrae.

Alignment: Stable.  Mild T12 superior endplate retropulsion.

Vertebrae: Decreased osseous mineralization. Interval kyphoplasty at
T12. Lumbar vertebral body heights are stable. No acute fracture.

Paraspinal and other soft tissues: No acute abnormality.

Disc levels: Stable degenerative changes primarily at L5-S1.
IMPRESSION: No acute fracture.

Interval T12 kyphoplasty.

## 2021-07-14 IMAGING — CR DG CHEST 1V
1 series · 1 of 1 positions shown · non-contrast
Comparison: [DATE].

CLINICAL DATA: Fall with pain and deformity, initial encounter.

EXAM:
CHEST  1 VIEW

[chest ap]
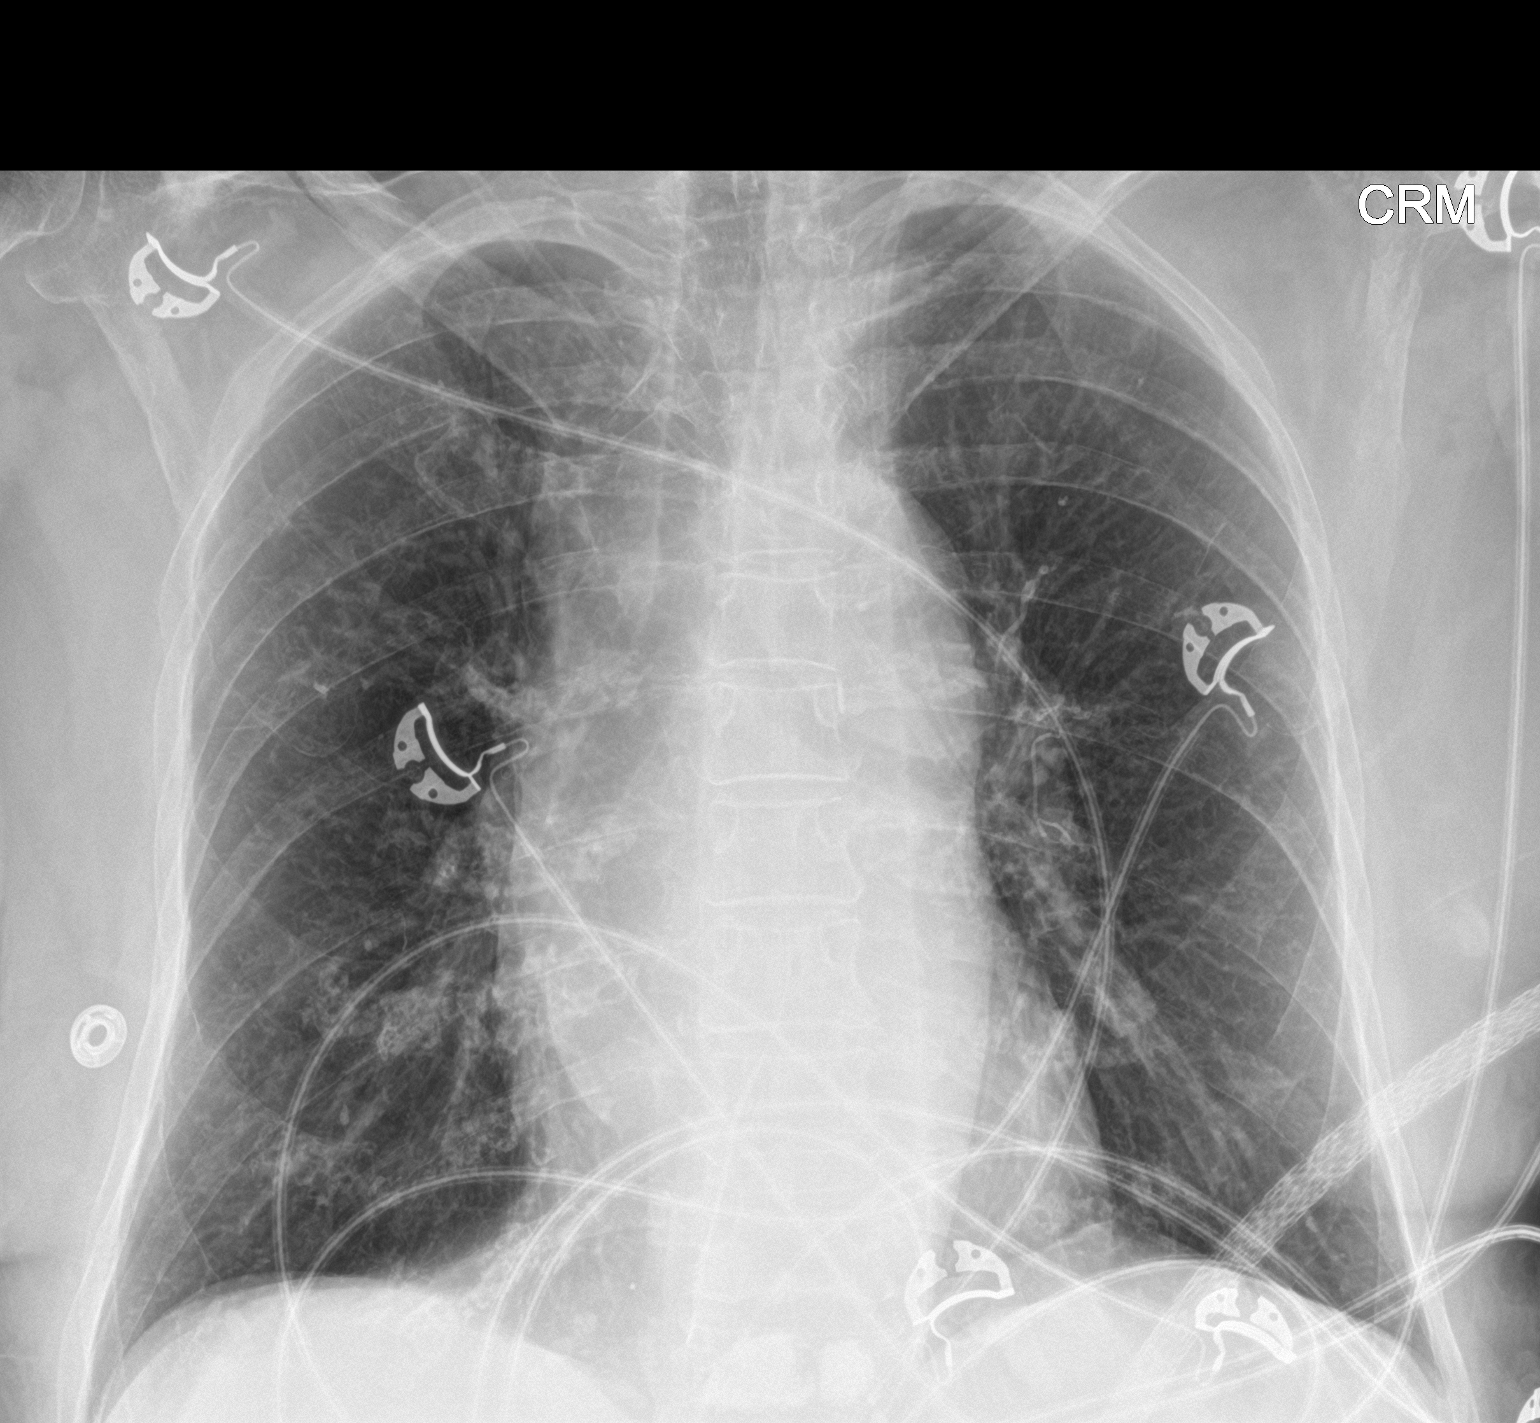

[1 of 1 positions shown; findings below may reference images not displayed]

FINDINGS: Trachea is midline. Heart size normal. Lungs are clear. No pleural
fluid. Osseous structures appear grossly intact.
IMPRESSION: No acute findings.

## 2021-07-14 IMAGING — RF DG FEMUR 2+V*L*
1 series · 6 of 6 positions shown · non-contrast
Comparison: [DATE].

CLINICAL DATA: Intramedullary nail placement.

EXAM:
LEFT FEMUR 2 VIEWS

[Series 1: run · 6 of 6 slices shown]
[im 1/6]
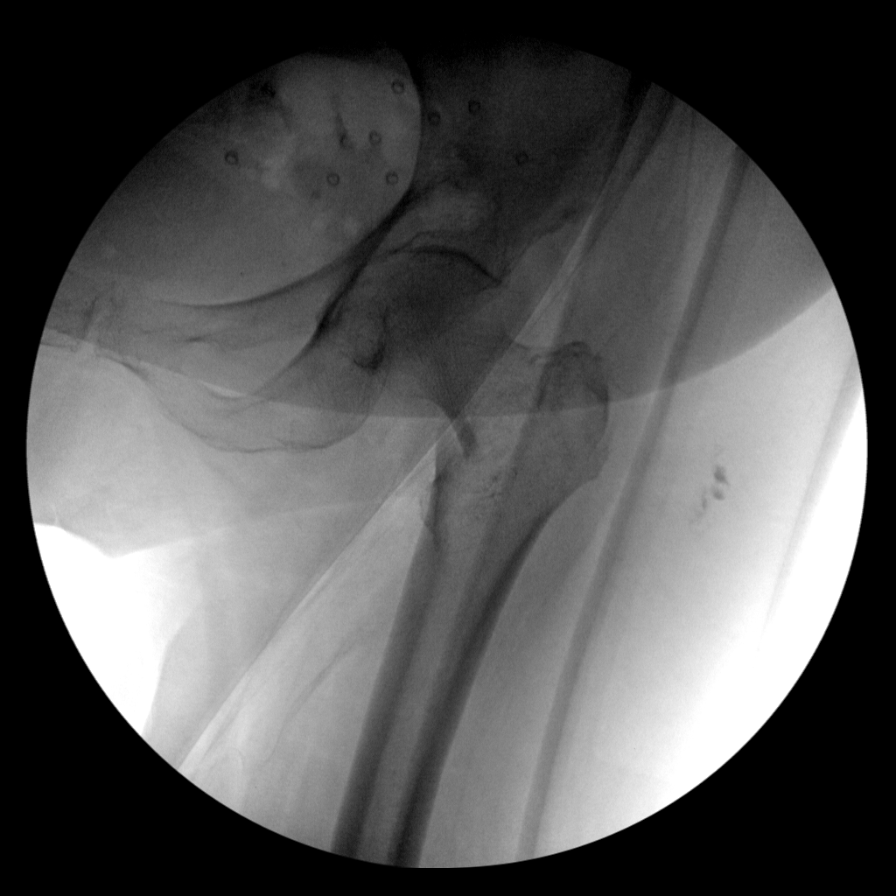
[im 2/6]
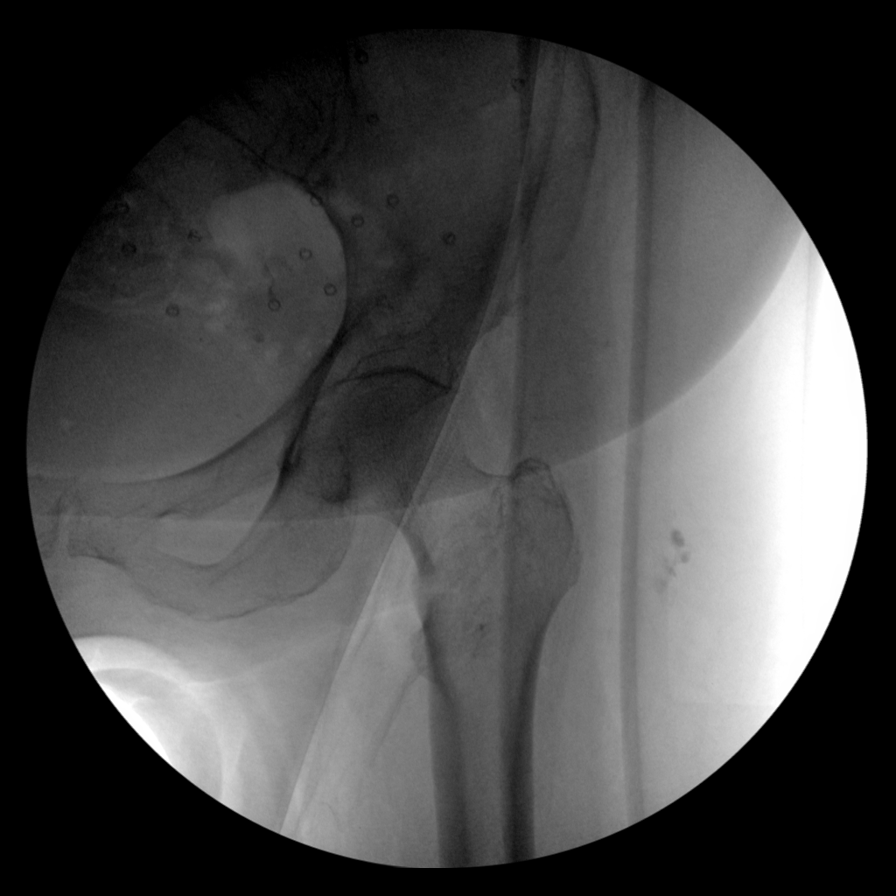
[im 3/6]
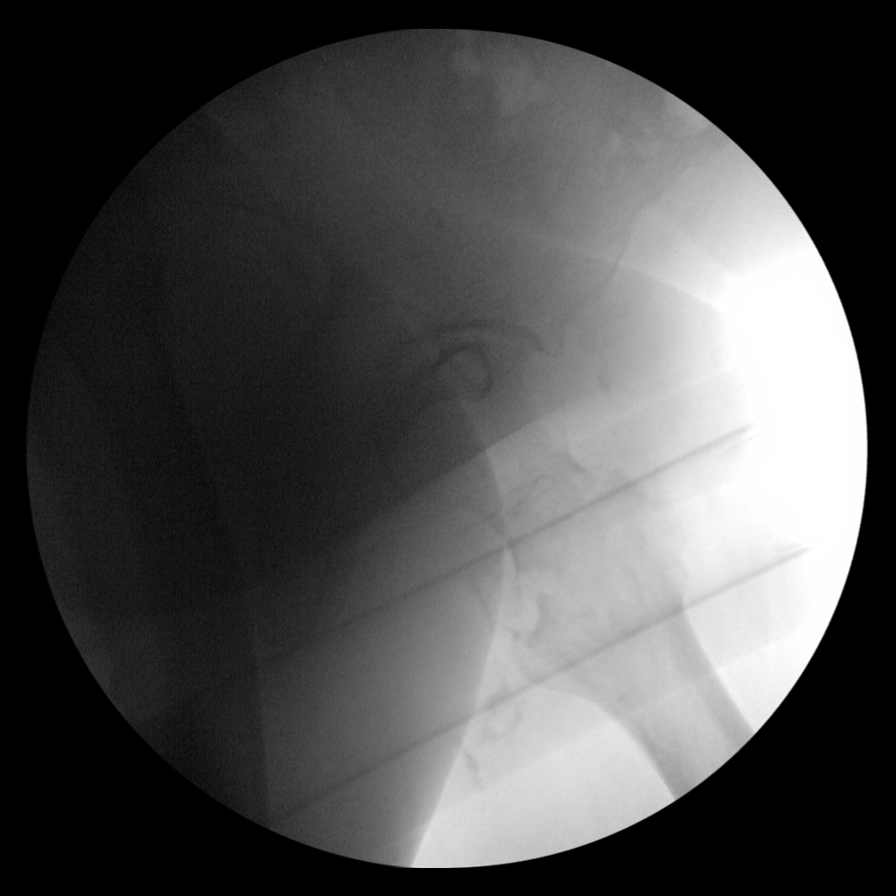
[im 4/6]
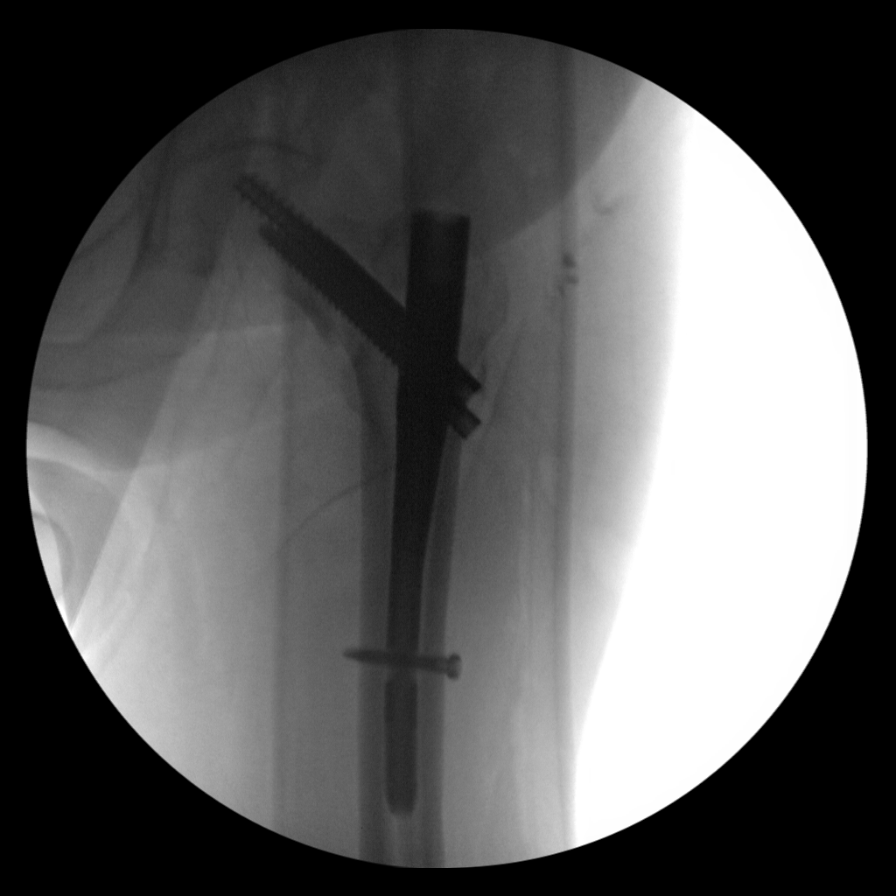
[im 5/6]
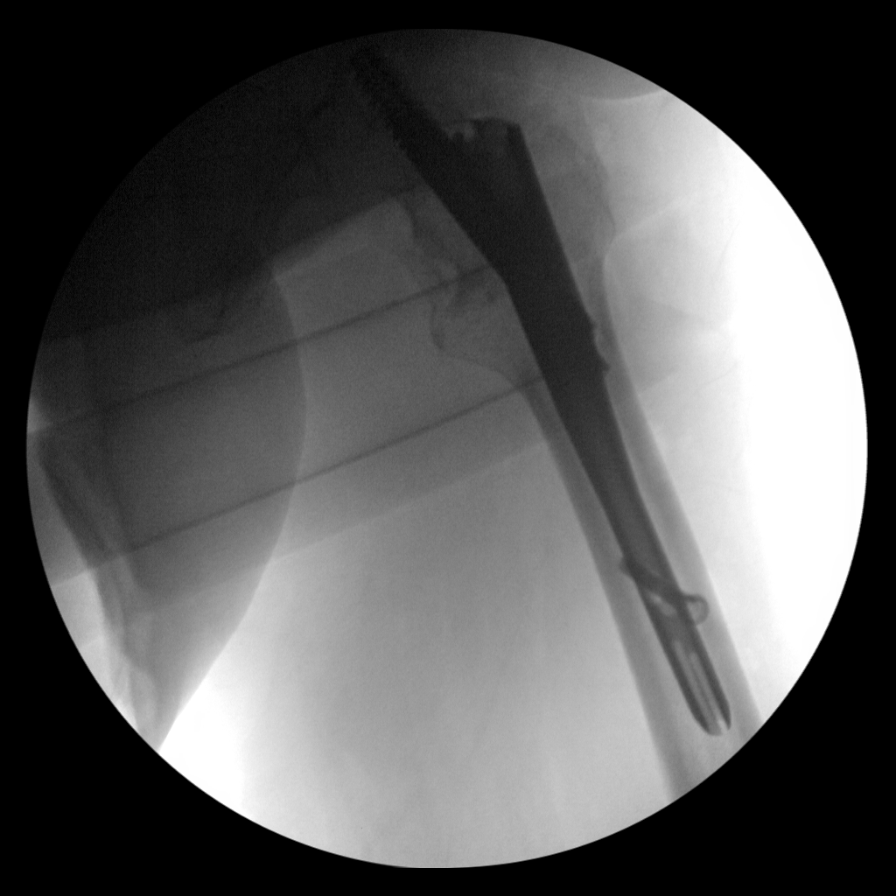
[im 6/6]
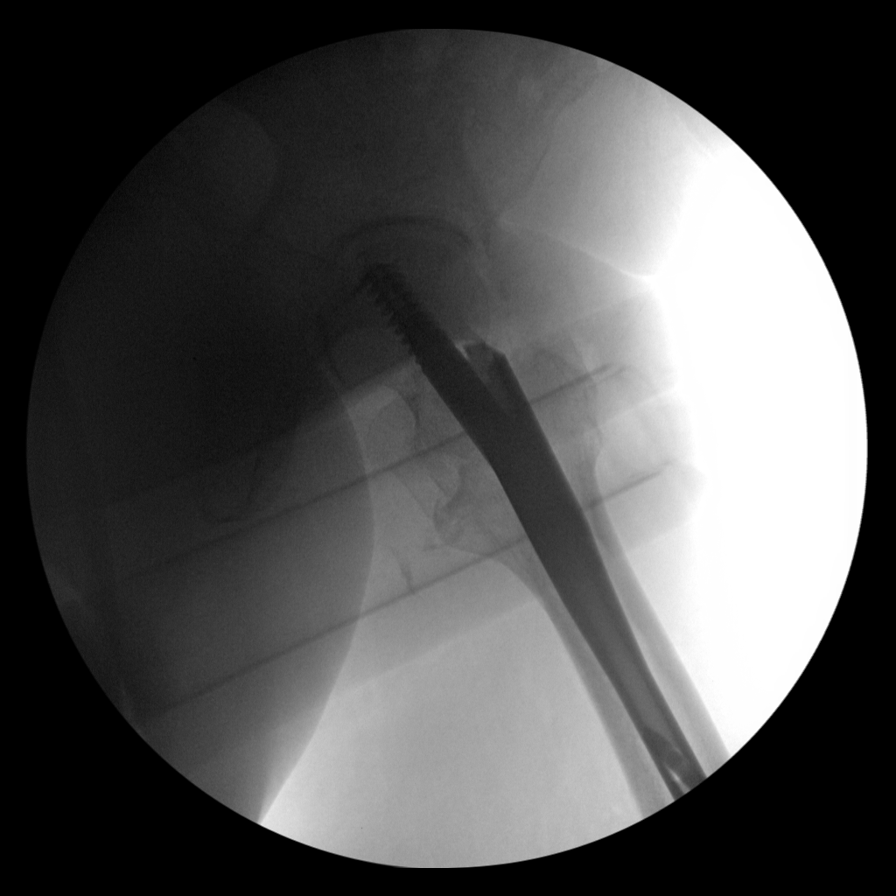

[6 of 6 positions shown; findings below may reference images not displayed]

FINDINGS: Six intraoperative fluoroscopic spot views of the left hip are
submitted. Initial images show and intertrochanteric left femur
fracture. Subsequent images show placement of an IM nail with two
proximal screws and 1 distal interlocking screw.
IMPRESSION: Intraoperative visualization for ORIF left intertrochanteric femur
fracture.

## 2021-07-14 IMAGING — CR DG KNEE 1-2V*L*
2 series · 2 of 2 positions shown · non-contrast
Comparison: None Available.

CLINICAL DATA: Fall, leg pain, deformity, initial encounter.

EXAM:
LEFT KNEE - 1-2 VIEW

[knee ap]
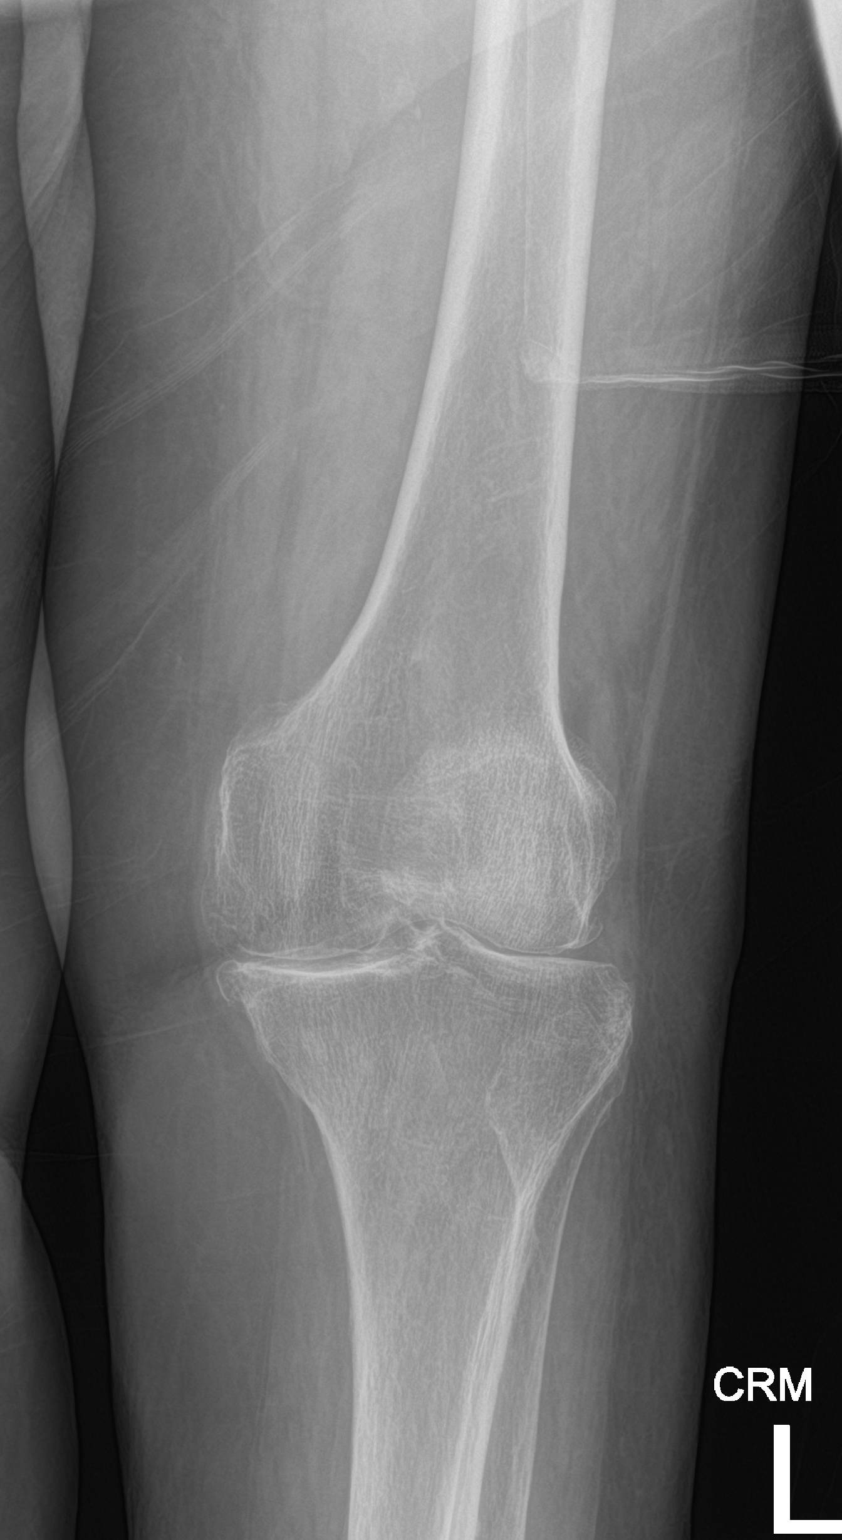

[knee lat]
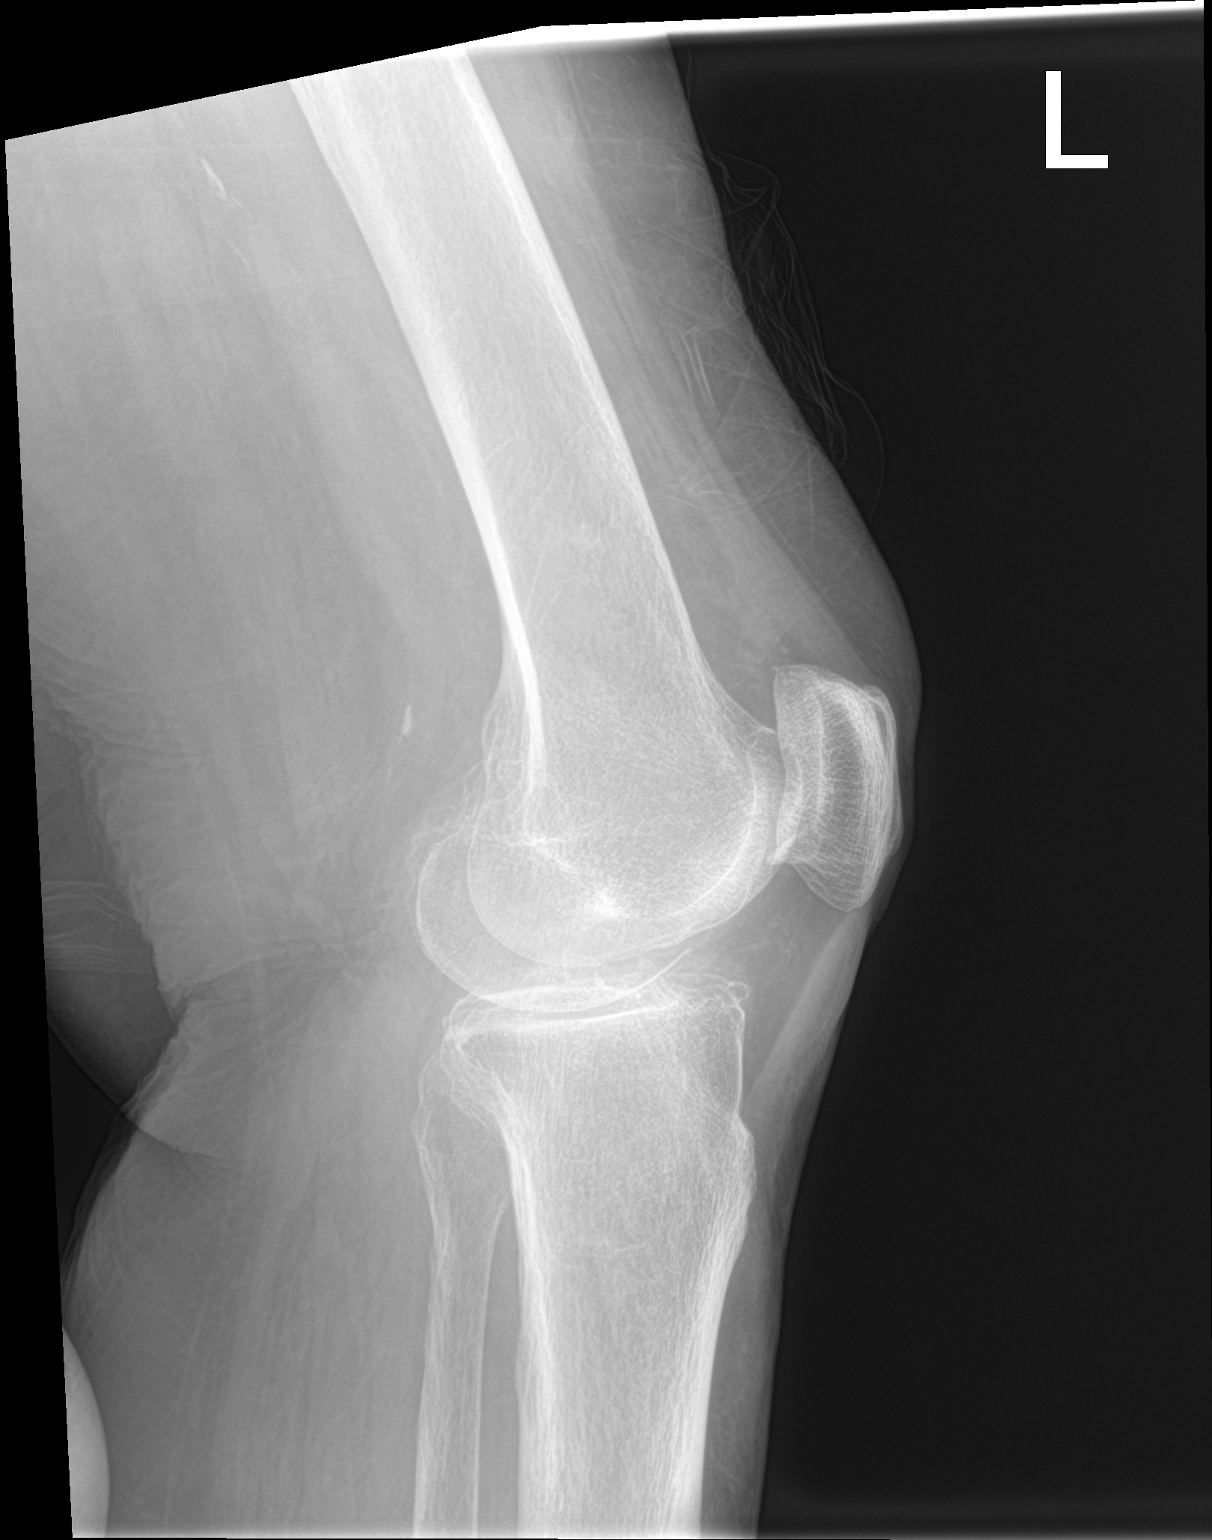

[2 of 2 positions shown; findings below may reference images not displayed]

FINDINGS: No acute osseous or joint abnormality. Joint space narrowing in the
medial and lateral compartments with tricompartment osteophytosis.
Slight lateral subluxation of the proximal tibia with respect to the
distal femur.
IMPRESSION: 1. No acute findings.
2. Tricompartment osteoarthritis.

## 2021-07-14 IMAGING — DX DG HIP (WITH OR WITHOUT PELVIS) 1V PORT*L*
2 series · 3 of 3 positions shown · non-contrast
Comparison: Study done earlier today

CLINICAL DATA: Internal fixation of fracture of proximal left femur

EXAM:
DG HIP (WITH OR WITHOUT PELVIS) 1V PORT LEFT

[pelvis]
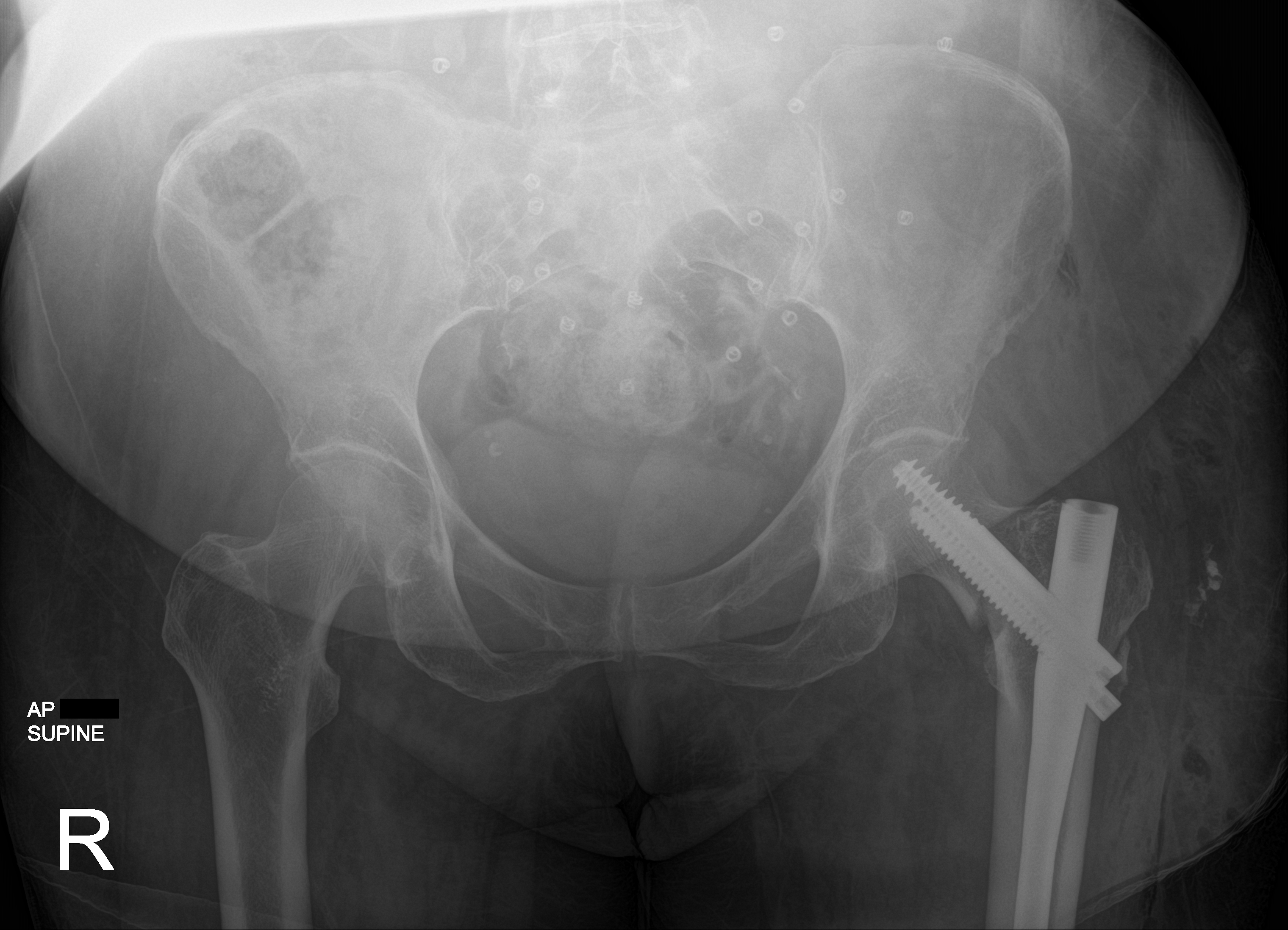

[Series 2: hip · 0.14mm/px · 2 of 2 slices shown]
[im 1/2]
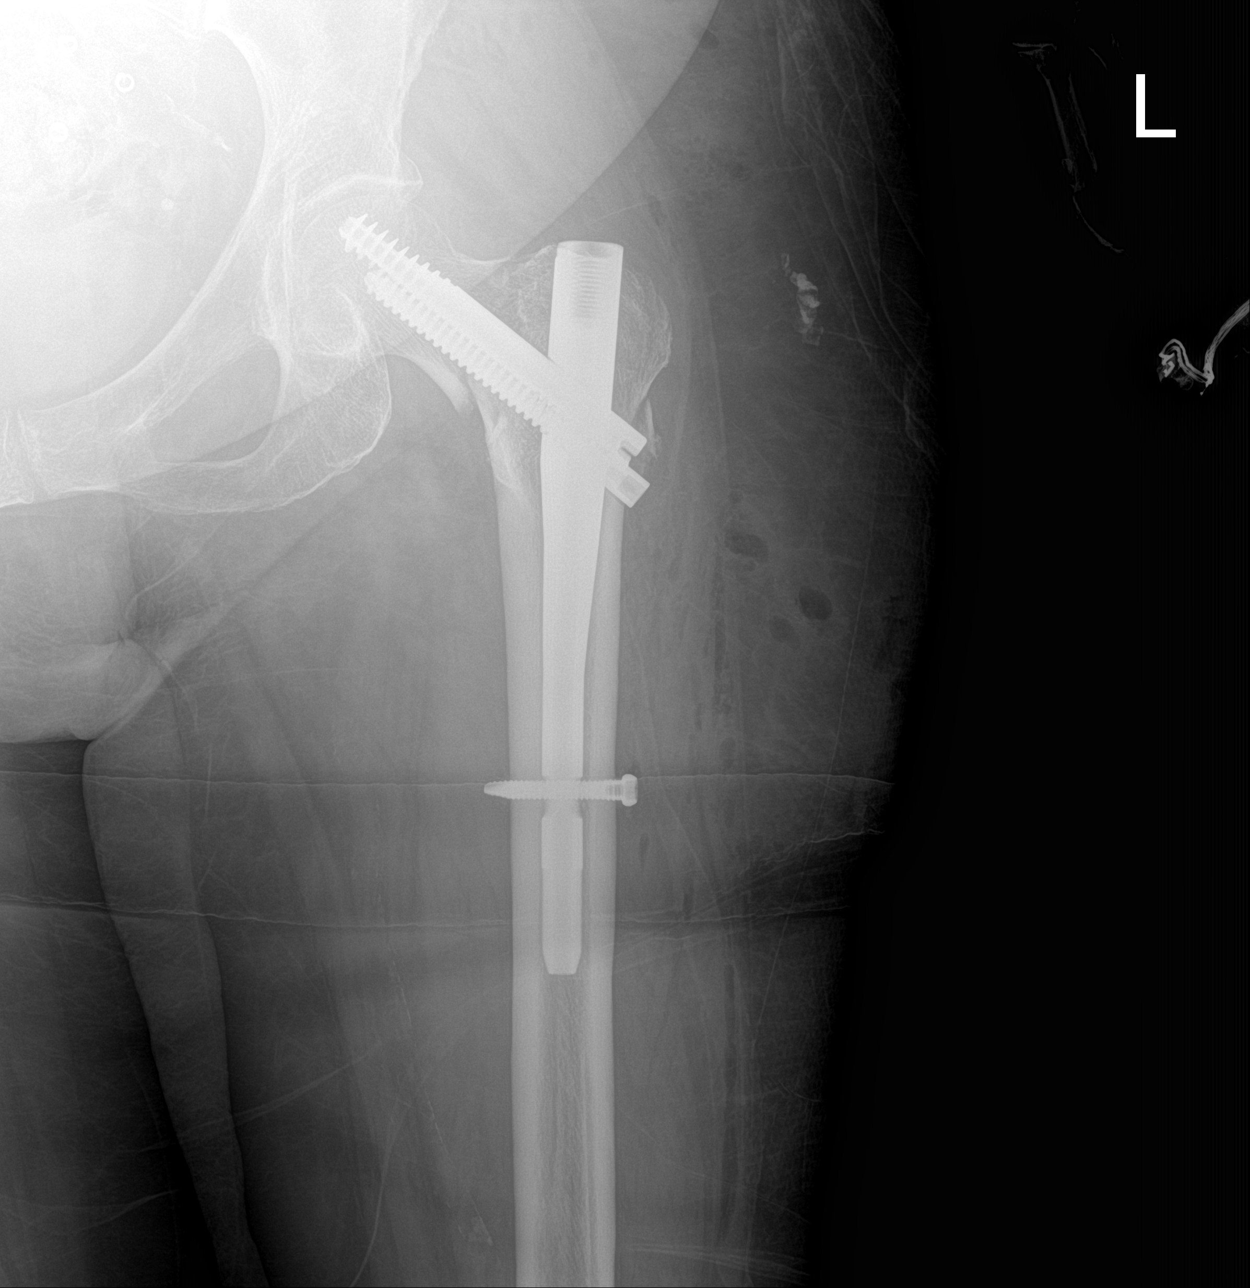
[im 2/2]
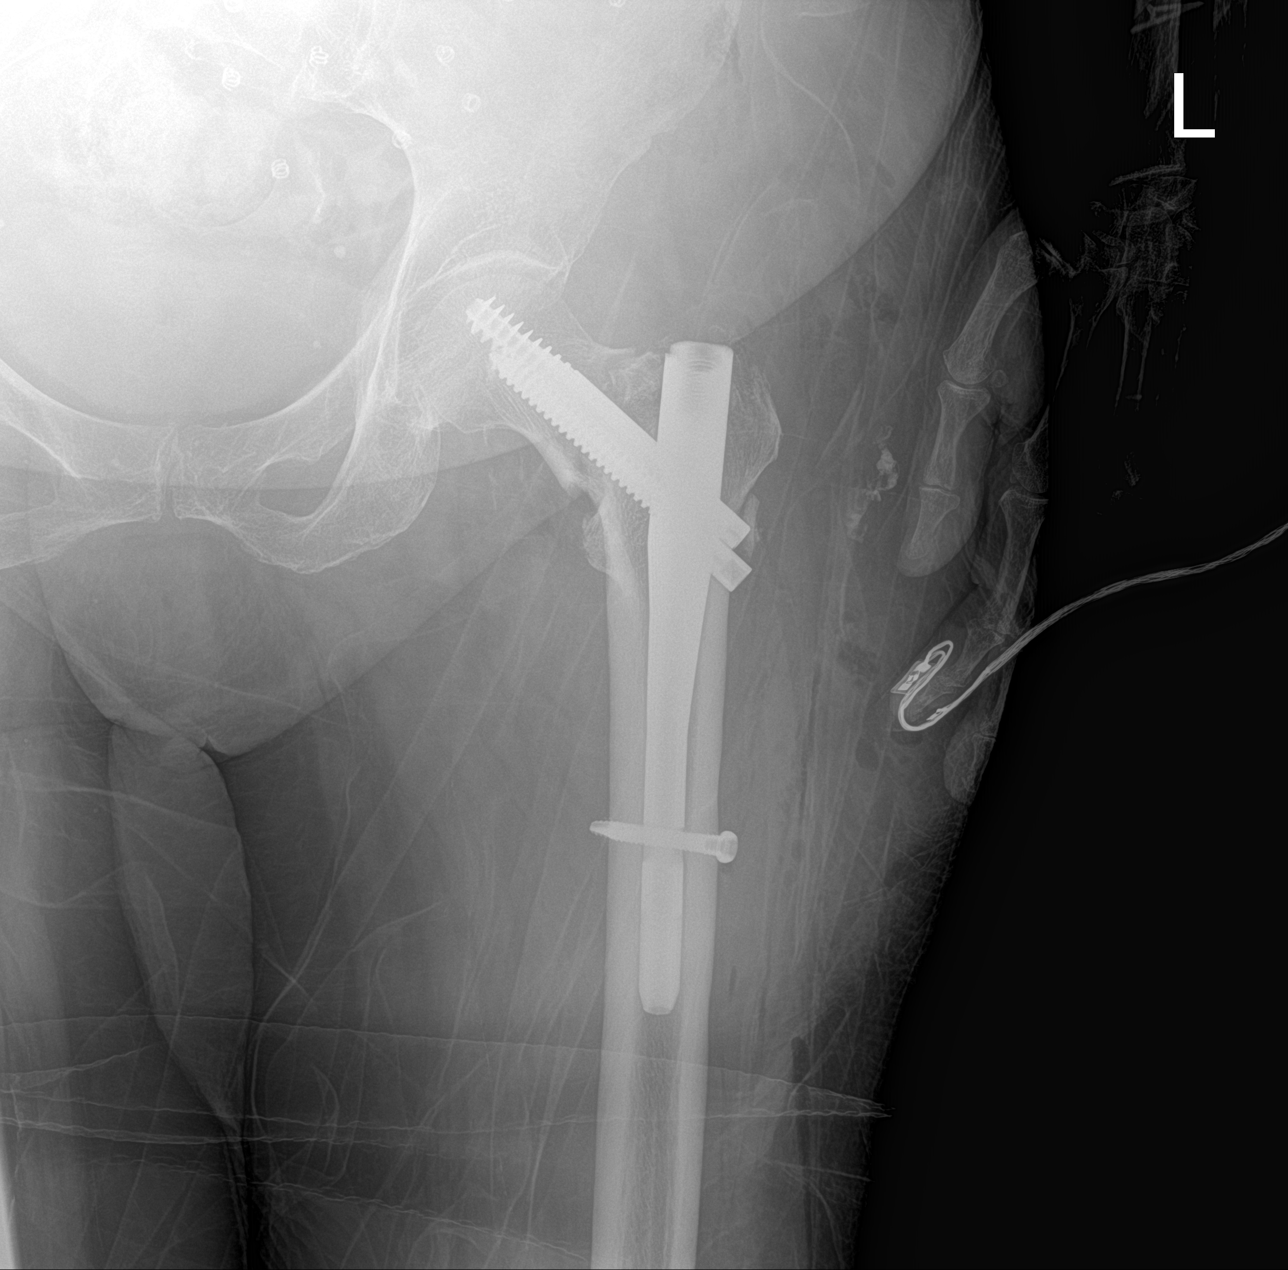

[3 of 3 positions shown; findings below may reference images not displayed]

FINDINGS: There is interval internal fixation of comminuted intertrochanteric
fracture of left femur with intramedullary rod. There is improvement
in alignment of fracture fragments. There are multiple metallic
densities in the lower abdomen, possibly residual from previous
ventral hernia repair.
IMPRESSION: Status post reduction and internal fixation of comminuted
intertrochanteric fracture of proximal left femur

## 2021-07-14 IMAGING — CT CT CERVICAL SPINE W/O CM
3 series · 15 of 33 positions shown, 18 images · non-contrast
Comparison: Head CT [DATE], cervical spine CT [DATE], and
head MRI [DATE].

CLINICAL DATA: Head trauma, minor (Age >= 65y); Neck trauma (Age >=
65y). Fall.



[Series 5: c spine soft · axial · 0.30mm/px · z∈[-257,-125]mm · 7 of 78 slices shown, 9 images]
[im 6/78  soft-tissue]
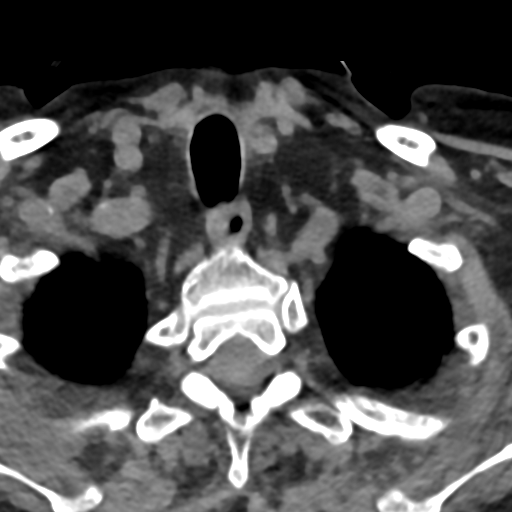
[im 6/78  bone]
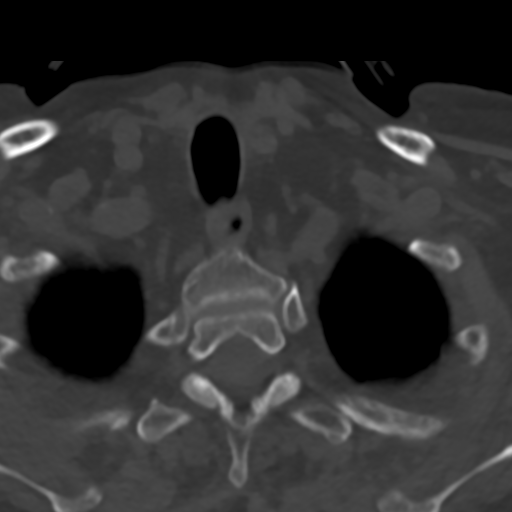
[im 18/78  bone]
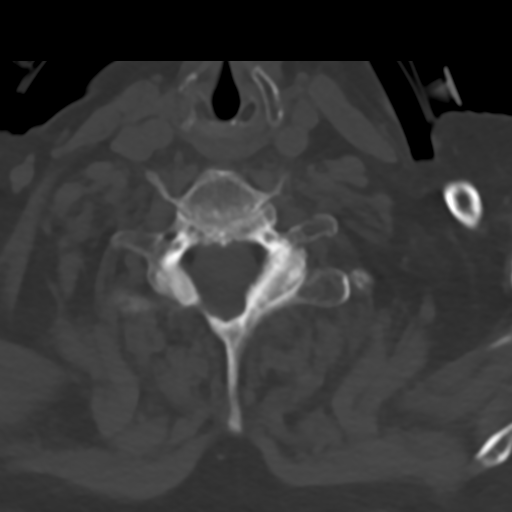
[im 30/78  bone]
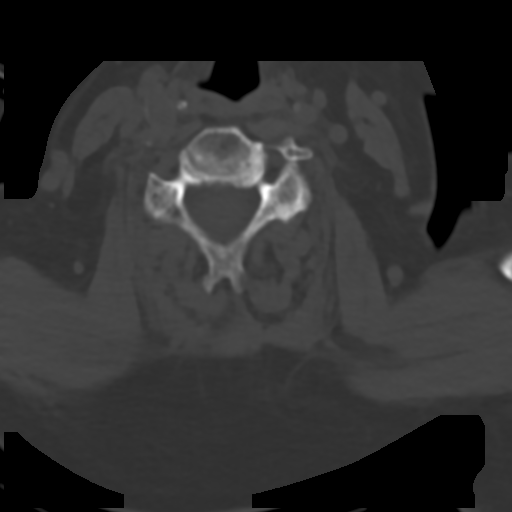
[im 42/78  bone]
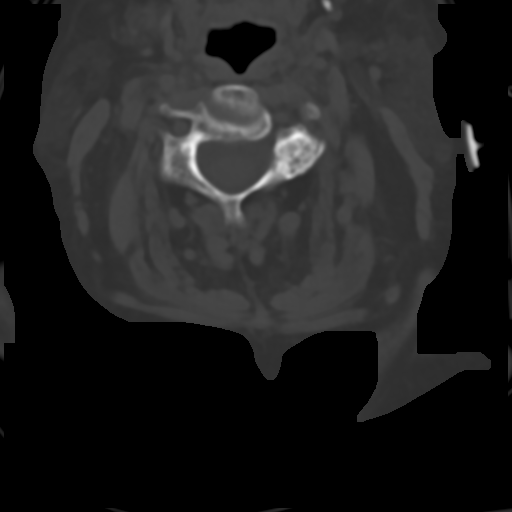
[im 48/78  soft-tissue]
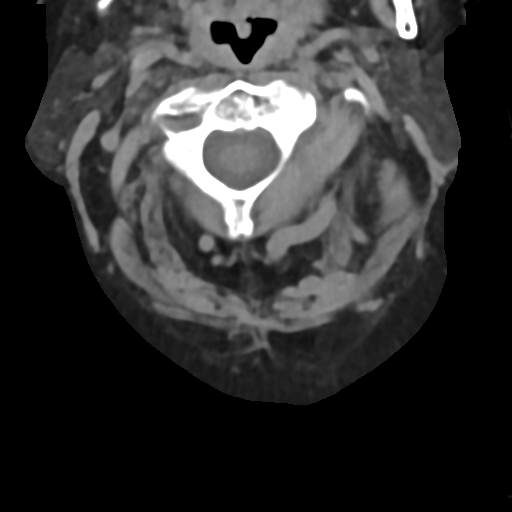
[im 48/78  bone]
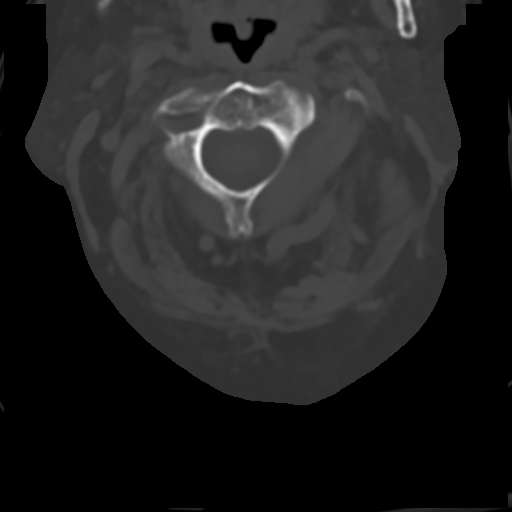
[im 60/78  bone]
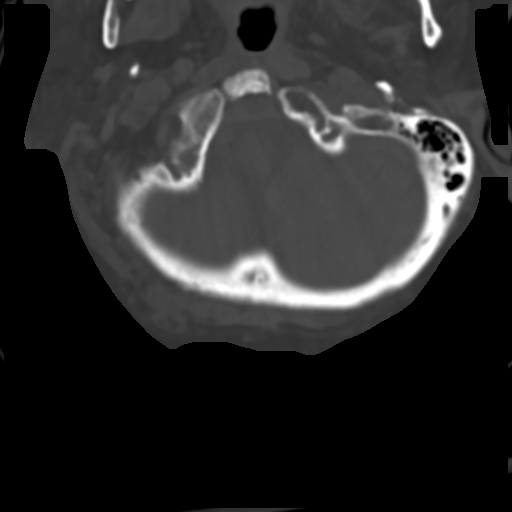
[im 72/78  bone]
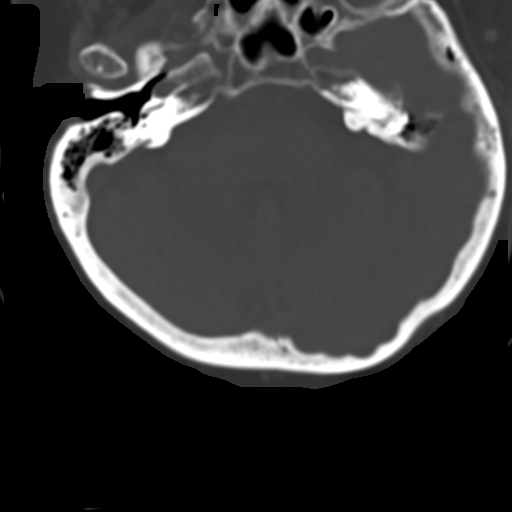

[Series 8: sag bone · sagittal · 0.32mm/px · 5 of 46 slices shown, 6 images]
[im 16/46  bone]
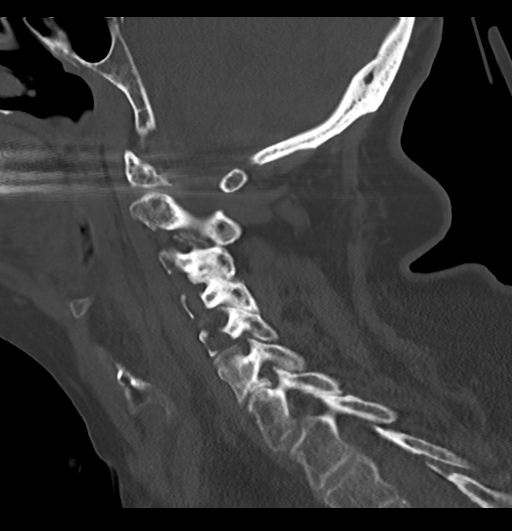
[im 19/46  bone]
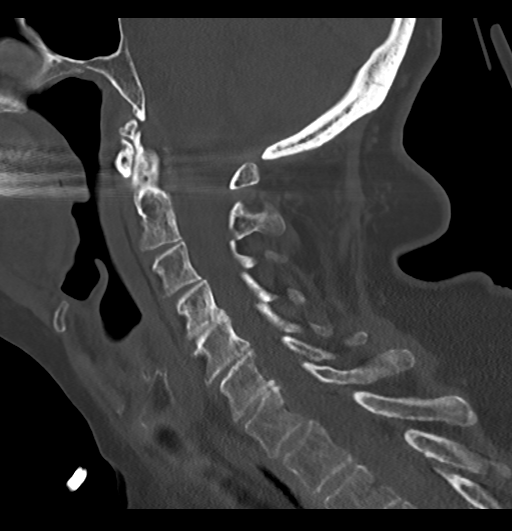
[im 23/46  soft-tissue]
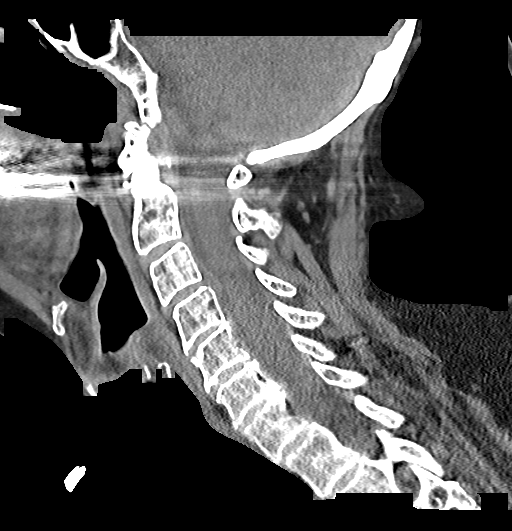
[im 23/46  bone]
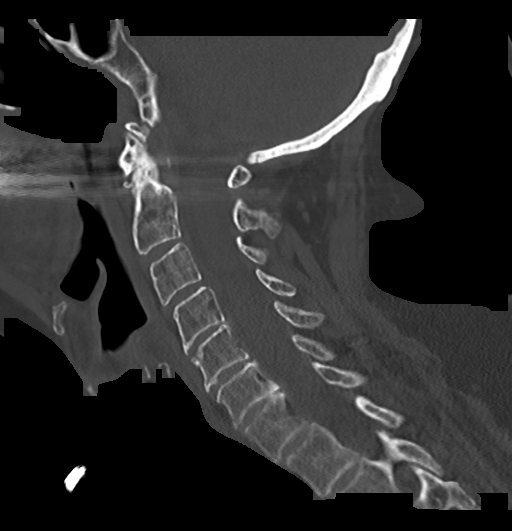
[im 27/46  bone]
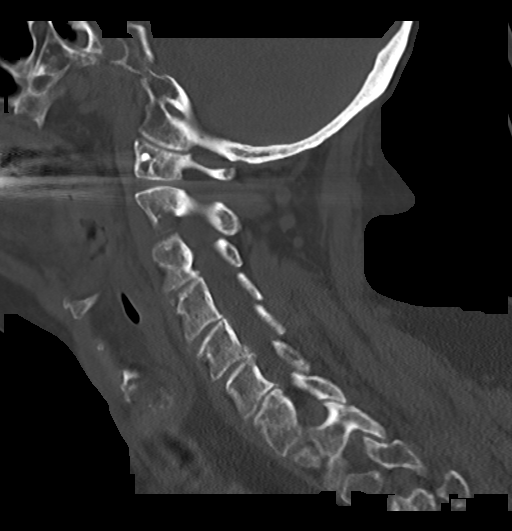
[im 31/46  bone]
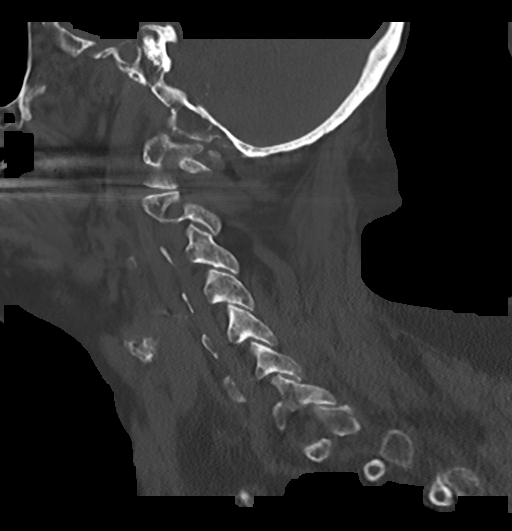

[Series 9: cor bone · coronal · 0.33mm/px · 3 of 51 slices shown]
[im 11/51  bone]
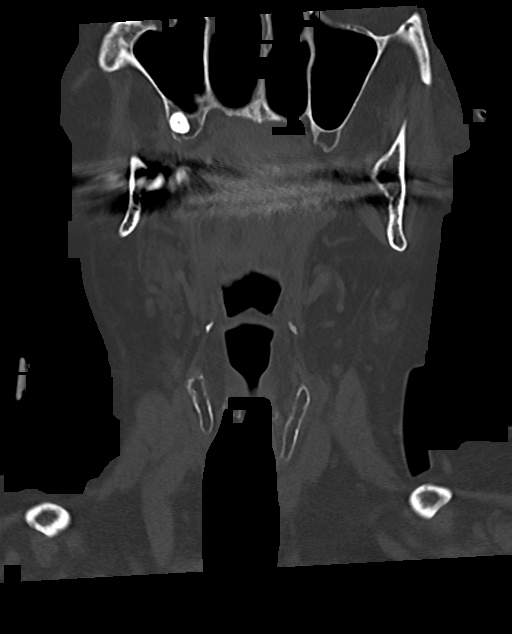
[im 21/51  bone]
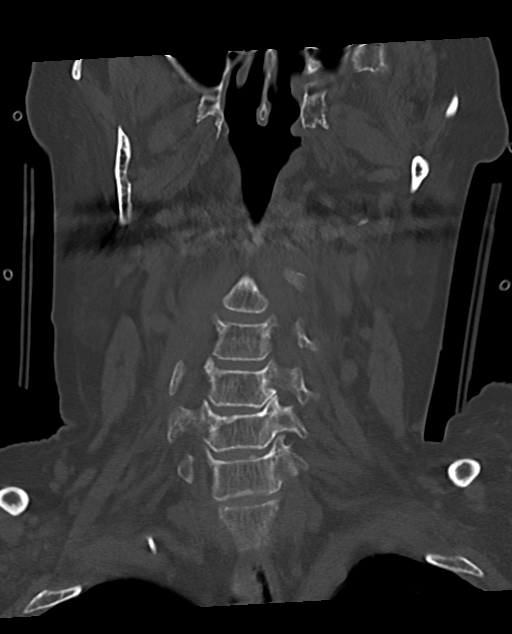
[im 30/51  bone]
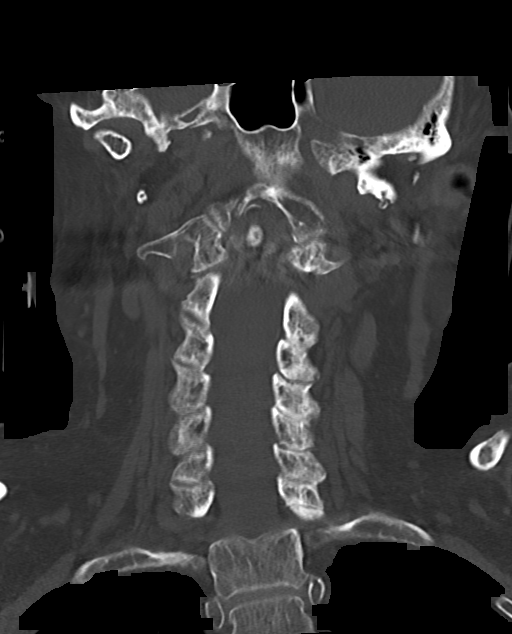

[15 of 33 positions shown; findings below may reference images not displayed]

FINDINGS: CT HEAD FINDINGS

Brain: There is no evidence of an acute infarct, intracranial
hemorrhage, mass, midline shift, or extra-axial fluid collection.
Mild cerebral atrophy is within normal limits for age. Patchy and
confluent hypodensities in the cerebral white matter bilaterally
have likely mildly progressed and are nonspecific but compatible
with extensive chronic small vessel ischemic disease.

Vascular: Calcified atherosclerosis at the skull base. No hyperdense
vessel.

Skull: No acute fracture or suspicious osseous lesion.

Sinuses/Orbits: Remote bilateral orbital fractures. Bilateral
cataract extraction. Mild mucosal thickening in the right sphenoid
sinus. Clear mastoid air cells.

Other: None.

CT CERVICAL SPINE FINDINGS

Alignment: Unchanged trace anterolisthesis of C3 on C4 and C7 on T1.

Skull base and vertebrae: No acute fracture or suspicious osseous
lesion. Unchanged median C1-2 arthropathy.

Soft tissues and spinal canal: No prevertebral fluid or swelling. No
visible canal hematoma.

Disc levels: Mild mid to lower cervical disc degeneration.
Asymmetrically advanced left-sided facet arthrosis, greatest at C2-3
and C3-4.

Upper chest: Clear lung apices.

Other: None.
IMPRESSION: 1. No evidence of acute intracranial abnormality.
2. Extensive chronic small vessel ischemic disease.
3. No acute fracture or traumatic subluxation in the cervical spine.

## 2021-07-14 SURGERY — INSERTION, INTRAMEDULLARY ROD, FEMUR
Anesthesia: General | Laterality: Left

## 2021-07-14 MED ORDER — CEFAZOLIN SODIUM-DEXTROSE 2-4 GM/100ML-% IV SOLN
2.0000 g | INTRAVENOUS | Status: AC
  Administered 2021-07-14: 2 g via INTRAVENOUS
  Filled 2021-07-14: qty 100

## 2021-07-14 MED ORDER — DOCUSATE SODIUM 100 MG PO CAPS
100.0000 mg | ORAL_CAPSULE | Freq: Two times a day (BID) | ORAL | Status: DC
  Administered 2021-07-14 – 2021-07-17 (×4): 100 mg via ORAL
  Filled 2021-07-14 (×12): qty 1

## 2021-07-14 MED ORDER — ONDANSETRON HCL 4 MG PO TABS
4.0000 mg | ORAL_TABLET | Freq: Four times a day (QID) | ORAL | Status: DC | PRN
  Administered 2021-07-15: 4 mg via ORAL
  Filled 2021-07-14: qty 1

## 2021-07-14 MED ORDER — HYDROMORPHONE HCL 1 MG/ML IJ SOLN
0.2500 mg | INTRAMUSCULAR | Status: DC | PRN
  Administered 2021-07-14: 0.5 mg via INTRAVENOUS

## 2021-07-14 MED ORDER — TRAMADOL HCL 50 MG PO TABS
50.0000 mg | ORAL_TABLET | Freq: Four times a day (QID) | ORAL | Status: DC | PRN
  Administered 2021-07-15: 100 mg via ORAL
  Filled 2021-07-14: qty 2

## 2021-07-14 MED ORDER — POVIDONE-IODINE 10 % EX SWAB: 2.0000 "application " | Freq: Once | CUTANEOUS | Status: DC

## 2021-07-14 MED ORDER — CHLORHEXIDINE GLUCONATE 0.12 % MT SOLN
OROMUCOSAL | Status: AC
  Administered 2021-07-14: 15 mL via OROMUCOSAL
  Filled 2021-07-14: qty 15

## 2021-07-14 MED ORDER — SUGAMMADEX SODIUM 200 MG/2ML IV SOLN
INTRAVENOUS | Status: DC | PRN
  Administered 2021-07-14: 158.8 mg via INTRAVENOUS

## 2021-07-14 MED ORDER — ONDANSETRON HCL 4 MG/2ML IJ SOLN
4.0000 mg | Freq: Once | INTRAMUSCULAR | Status: AC
Start: 2021-07-14 — End: 2021-07-14
  Administered 2021-07-14: 4 mg via INTRAVENOUS
  Filled 2021-07-14: qty 2

## 2021-07-14 MED ORDER — ROCURONIUM BROMIDE 10 MG/ML (PF) SYRINGE
PREFILLED_SYRINGE | INTRAVENOUS | Status: DC | PRN
  Administered 2021-07-14: 50 mg via INTRAVENOUS

## 2021-07-14 MED ORDER — LIDOCAINE 2% (20 MG/ML) 5 ML SYRINGE
INTRAMUSCULAR | Status: DC | PRN
  Administered 2021-07-14: 60 mg via INTRAVENOUS

## 2021-07-14 MED ORDER — TRANEXAMIC ACID-NACL 1000-0.7 MG/100ML-% IV SOLN
1000.0000 mg | Freq: Once | INTRAVENOUS | Status: AC
  Administered 2021-07-14: 1000 mg via INTRAVENOUS
  Filled 2021-07-14: qty 100

## 2021-07-14 MED ORDER — HYDROMORPHONE HCL 1 MG/ML IJ SOLN
1.0000 mg | Freq: Once | INTRAMUSCULAR | Status: AC
  Administered 2021-07-14: 1 mg via INTRAVENOUS
  Filled 2021-07-14: qty 1

## 2021-07-14 MED ORDER — OXYCODONE HCL 5 MG PO TABS
5.0000 mg | ORAL_TABLET | Freq: Four times a day (QID) | ORAL | Status: DC | PRN
  Administered 2021-07-14 – 2021-07-15 (×2): 5 mg via ORAL
  Filled 2021-07-14 (×2): qty 1

## 2021-07-14 MED ORDER — ACETAMINOPHEN 325 MG PO TABS
650.0000 mg | ORAL_TABLET | Freq: Four times a day (QID) | ORAL | Status: DC
  Administered 2021-07-14 – 2021-07-27 (×45): 650 mg via ORAL
  Filled 2021-07-14 (×47): qty 2

## 2021-07-14 MED ORDER — 0.9 % SODIUM CHLORIDE (POUR BTL) OPTIME
TOPICAL | Status: DC | PRN
  Administered 2021-07-14: 1000 mL

## 2021-07-14 MED ORDER — PROPOFOL 10 MG/ML IV BOLUS
INTRAVENOUS | Status: AC
  Filled 2021-07-14: qty 20

## 2021-07-14 MED ORDER — OXYCODONE HCL 5 MG PO TABS: 5.0000 mg | ORAL_TABLET | Freq: Once | ORAL | Status: DC | PRN

## 2021-07-14 MED ORDER — METHOCARBAMOL 500 MG PO TABS
500.0000 mg | ORAL_TABLET | Freq: Four times a day (QID) | ORAL | Status: DC | PRN
  Administered 2021-07-15 – 2021-07-24 (×19): 500 mg via ORAL
  Filled 2021-07-14 (×20): qty 1

## 2021-07-14 MED ORDER — HYDROMORPHONE HCL 1 MG/ML IJ SOLN
INTRAMUSCULAR | Status: AC
Start: 2021-07-14 — End: 2021-07-14
  Administered 2021-07-14: 0.5 mg via INTRAVENOUS
  Filled 2021-07-14: qty 1

## 2021-07-14 MED ORDER — GABAPENTIN 100 MG PO CAPS
200.0000 mg | ORAL_CAPSULE | Freq: Two times a day (BID) | ORAL | Status: DC
  Administered 2021-07-14 – 2021-07-27 (×26): 200 mg via ORAL
  Filled 2021-07-14 (×26): qty 2

## 2021-07-14 MED ORDER — CEFAZOLIN SODIUM-DEXTROSE 1-4 GM/50ML-% IV SOLN
1.0000 g | Freq: Three times a day (TID) | INTRAVENOUS | Status: AC
  Administered 2021-07-14 – 2021-07-15 (×3): 1 g via INTRAVENOUS
  Filled 2021-07-14 (×3): qty 50

## 2021-07-14 MED ORDER — LACTATED RINGERS IV SOLN: INTRAVENOUS | Status: DC

## 2021-07-14 MED ORDER — HYDROMORPHONE HCL 1 MG/ML IJ SOLN
0.5000 mg | INTRAMUSCULAR | Status: DC | PRN
  Administered 2021-07-15 – 2021-07-24 (×23): 0.5 mg via INTRAVENOUS
  Filled 2021-07-14 (×24): qty 0.5

## 2021-07-14 MED ORDER — HYDROMORPHONE HCL 1 MG/ML IJ SOLN: 0.5000 mg | INTRAMUSCULAR | Status: DC | PRN

## 2021-07-14 MED ORDER — FENTANYL CITRATE (PF) 250 MCG/5ML IJ SOLN
INTRAMUSCULAR | Status: AC
  Filled 2021-07-14: qty 5

## 2021-07-14 MED ORDER — HYDROMORPHONE HCL 1 MG/ML IJ SOLN
INTRAMUSCULAR | Status: AC
  Filled 2021-07-14: qty 1

## 2021-07-14 MED ORDER — ONDANSETRON HCL 4 MG/2ML IJ SOLN
4.0000 mg | Freq: Four times a day (QID) | INTRAMUSCULAR | Status: DC | PRN
  Administered 2021-07-15: 4 mg via INTRAVENOUS
  Filled 2021-07-14: qty 2

## 2021-07-14 MED ORDER — CHLORHEXIDINE GLUCONATE 4 % EX LIQD
60.0000 mL | Freq: Once | CUTANEOUS | Status: DC
  Filled 2021-07-14: qty 60

## 2021-07-14 MED ORDER — ACETAMINOPHEN 500 MG PO TABS
1000.0000 mg | ORAL_TABLET | Freq: Once | ORAL | Status: AC
  Administered 2021-07-14: 1000 mg via ORAL

## 2021-07-14 MED ORDER — LEVOTHYROXINE SODIUM 100 MCG PO TABS
100.0000 ug | ORAL_TABLET | Freq: Every day | ORAL | Status: DC
  Administered 2021-07-15 – 2021-07-27 (×13): 100 ug via ORAL
  Filled 2021-07-14 (×14): qty 1

## 2021-07-14 MED ORDER — ONDANSETRON HCL 4 MG/2ML IJ SOLN
INTRAMUSCULAR | Status: DC | PRN
  Administered 2021-07-14: 4 mg via INTRAVENOUS

## 2021-07-14 MED ORDER — AMISULPRIDE (ANTIEMETIC) 5 MG/2ML IV SOLN: 10.0000 mg | Freq: Once | INTRAVENOUS | Status: DC | PRN

## 2021-07-14 MED ORDER — ACETAMINOPHEN 500 MG PO TABS
ORAL_TABLET | ORAL | Status: AC
  Filled 2021-07-14: qty 2

## 2021-07-14 MED ORDER — CHLORHEXIDINE GLUCONATE 0.12 % MT SOLN: 15.0000 mL | OROMUCOSAL | Status: AC

## 2021-07-14 MED ORDER — POLYETHYLENE GLYCOL 3350 17 G PO PACK
17.0000 g | PACK | Freq: Every day | ORAL | Status: DC | PRN
  Administered 2021-07-16: 17 g via ORAL
  Filled 2021-07-14: qty 1

## 2021-07-14 MED ORDER — METOCLOPRAMIDE HCL 5 MG PO TABS
5.0000 mg | ORAL_TABLET | Freq: Three times a day (TID) | ORAL | Status: DC | PRN
  Administered 2021-07-15: 10 mg via ORAL
  Filled 2021-07-14: qty 2

## 2021-07-14 MED ORDER — PROPOFOL 10 MG/ML IV BOLUS
INTRAVENOUS | Status: DC | PRN
  Administered 2021-07-14: 100 mg via INTRAVENOUS

## 2021-07-14 MED ORDER — FENTANYL CITRATE (PF) 250 MCG/5ML IJ SOLN
INTRAMUSCULAR | Status: DC | PRN
  Administered 2021-07-14: 50 ug via INTRAVENOUS
  Administered 2021-07-14: 100 ug via INTRAVENOUS

## 2021-07-14 MED ORDER — ACETAMINOPHEN 10 MG/ML IV SOLN
INTRAVENOUS | Status: AC
  Filled 2021-07-14: qty 100

## 2021-07-14 MED ORDER — PANTOPRAZOLE SODIUM 40 MG PO TBEC
40.0000 mg | DELAYED_RELEASE_TABLET | Freq: Every day | ORAL | Status: DC
  Administered 2021-07-15 – 2021-07-24 (×10): 40 mg via ORAL
  Filled 2021-07-14 (×9): qty 1

## 2021-07-14 MED ORDER — HYDROMORPHONE HCL 1 MG/ML IJ SOLN
0.5000 mg | Freq: Once | INTRAMUSCULAR | Status: AC
  Administered 2021-07-14: 0.5 mg via INTRAVENOUS

## 2021-07-14 MED ORDER — DOCUSATE SODIUM 100 MG PO CAPS: 100.0000 mg | ORAL_CAPSULE | Freq: Two times a day (BID) | ORAL | Status: DC

## 2021-07-14 MED ORDER — HYDROXYZINE HCL 10 MG PO TABS
10.0000 mg | ORAL_TABLET | Freq: Two times a day (BID) | ORAL | Status: DC | PRN
  Filled 2021-07-14 (×2): qty 1

## 2021-07-14 MED ORDER — ONDANSETRON HCL 4 MG/2ML IJ SOLN: 4.0000 mg | Freq: Once | INTRAMUSCULAR | Status: DC | PRN

## 2021-07-14 MED ORDER — METOPROLOL SUCCINATE ER 50 MG PO TB24
50.0000 mg | ORAL_TABLET | Freq: Every day | ORAL | Status: DC
  Administered 2021-07-15 – 2021-07-27 (×12): 50 mg via ORAL
  Filled 2021-07-14 (×13): qty 1

## 2021-07-14 MED ORDER — METOCLOPRAMIDE HCL 5 MG/ML IJ SOLN: 5.0000 mg | Freq: Three times a day (TID) | INTRAMUSCULAR | Status: DC | PRN

## 2021-07-14 MED ORDER — VITAMIN D 25 MCG (1000 UNIT) PO TABS
1000.0000 [IU] | ORAL_TABLET | Freq: Every day | ORAL | Status: DC
  Administered 2021-07-15 – 2021-07-27 (×13): 1000 [IU] via ORAL
  Filled 2021-07-14 (×13): qty 1

## 2021-07-14 MED ORDER — OXYCODONE HCL 5 MG/5ML PO SOLN: 5.0000 mg | Freq: Once | ORAL | Status: DC | PRN

## 2021-07-14 MED ORDER — METHOCARBAMOL 1000 MG/10ML IJ SOLN
500.0000 mg | Freq: Four times a day (QID) | INTRAVENOUS | Status: DC | PRN
  Filled 2021-07-14: qty 5

## 2021-07-14 MED ORDER — DEXAMETHASONE SODIUM PHOSPHATE 10 MG/ML IJ SOLN
INTRAMUSCULAR | Status: DC | PRN
  Administered 2021-07-14: 10 mg via INTRAVENOUS

## 2021-07-14 SURGICAL SUPPLY — 56 items
BAG COUNTER SPONGE SURGICOUNT (BAG) ×2 IMPLANT
BIT DRILL INTERTAN LAG SCREW (BIT) ×1 IMPLANT
BIT DRILL LONG 4.0 (BIT) IMPLANT
BLADE SURG 10 STRL SS (BLADE) ×4 IMPLANT
BNDG COHESIVE 6X5 TAN STRL LF (GAUZE/BANDAGES/DRESSINGS) ×1 IMPLANT
BNDG ELASTIC 6X5.8 VLCR STR LF (GAUZE/BANDAGES/DRESSINGS) ×2 IMPLANT
CHLORAPREP W/TINT 26 (MISCELLANEOUS) ×2 IMPLANT
COVER SURGICAL LIGHT HANDLE (MISCELLANEOUS) ×2 IMPLANT
DERMABOND ADVANCED (GAUZE/BANDAGES/DRESSINGS) ×1
DERMABOND ADVANCED .7 DNX12 (GAUZE/BANDAGES/DRESSINGS) IMPLANT
DRAPE C-ARM 35X43 STRL (DRAPES) ×2 IMPLANT
DRAPE C-ARMOR (DRAPES) ×2 IMPLANT
DRAPE HALF SHEET 40X57 (DRAPES) ×4 IMPLANT
DRAPE IMP U-DRAPE 54X76 (DRAPES) ×4 IMPLANT
DRAPE INCISE IOBAN 66X45 STRL (DRAPES) ×2 IMPLANT
DRAPE ORTHO SPLIT 77X108 STRL (DRAPES) ×2
DRAPE SURG ORHT 6 SPLT 77X108 (DRAPES) ×2 IMPLANT
DRAPE U-SHAPE 47X51 STRL (DRAPES) ×2 IMPLANT
DRESSING MEPILEX FLEX 4X4 (GAUZE/BANDAGES/DRESSINGS) IMPLANT
DRILL BIT LONG 4.0 (BIT) ×2
DRSG MEPILEX BORDER 4X8 (GAUZE/BANDAGES/DRESSINGS) ×2 IMPLANT
DRSG MEPILEX FLEX 4X4 (GAUZE/BANDAGES/DRESSINGS) ×2
ELECT REM PT RETURN 9FT ADLT (ELECTROSURGICAL) ×2
ELECTRODE REM PT RTRN 9FT ADLT (ELECTROSURGICAL) ×1 IMPLANT
GLOVE BIO SURGEON STRL SZ 6.5 (GLOVE) ×6 IMPLANT
GLOVE BIO SURGEON STRL SZ7.5 (GLOVE) ×6 IMPLANT
GLOVE BIOGEL PI IND STRL 6.5 (GLOVE) ×1 IMPLANT
GLOVE BIOGEL PI IND STRL 7.5 (GLOVE) ×1 IMPLANT
GLOVE BIOGEL PI INDICATOR 6.5 (GLOVE) ×1
GLOVE BIOGEL PI INDICATOR 7.5 (GLOVE) ×1
GOWN STRL REUS W/ TWL LRG LVL3 (GOWN DISPOSABLE) ×3 IMPLANT
GOWN STRL REUS W/ TWL XL LVL3 (GOWN DISPOSABLE) ×1 IMPLANT
GOWN STRL REUS W/TWL LRG LVL3 (GOWN DISPOSABLE) ×3
GOWN STRL REUS W/TWL XL LVL3 (GOWN DISPOSABLE) ×1
GUIDE PIN 3.2X343 (PIN) ×2
GUIDE PIN 3.2X343MM (PIN) ×2
KIT BASIN OR (CUSTOM PROCEDURE TRAY) ×2 IMPLANT
KIT TURNOVER KIT B (KITS) ×2 IMPLANT
MANIFOLD NEPTUNE II (INSTRUMENTS) ×2 IMPLANT
NAIL INTERTAN 10X18 130D 10S (Nail) ×1 IMPLANT
NS IRRIG 1000ML POUR BTL (IV SOLUTION) ×2 IMPLANT
PACK GENERAL/GYN (CUSTOM PROCEDURE TRAY) ×2 IMPLANT
PAD ARMBOARD 7.5X6 YLW CONV (MISCELLANEOUS) ×4 IMPLANT
PIN GUIDE 3.2X343MM (PIN) IMPLANT
SCREW LAG COMPR KIT 90/85 (Screw) ×1 IMPLANT
SCREW TRIGEN LOW PROF 5.0X32.5 (Screw) ×1 IMPLANT
STAPLER VISISTAT 35W (STAPLE) ×2 IMPLANT
STOCKINETTE IMPERVIOUS LG (DRAPES) ×2 IMPLANT
SUT MNCRL AB 3-0 PS2 27 (SUTURE) ×1 IMPLANT
SUT VIC AB 0 CT1 27 (SUTURE) ×1
SUT VIC AB 0 CT1 27XBRD ANBCTR (SUTURE) IMPLANT
SUT VIC AB 2-0 CT1 27 (SUTURE) ×1
SUT VIC AB 2-0 CT1 TAPERPNT 27 (SUTURE) ×2 IMPLANT
TOWEL GREEN STERILE (TOWEL DISPOSABLE) ×4 IMPLANT
TOWEL GREEN STERILE FF (TOWEL DISPOSABLE) ×2 IMPLANT
WATER STERILE IRR 1000ML POUR (IV SOLUTION) ×2 IMPLANT

## 2021-07-14 NOTE — Anesthesia Postprocedure Evaluation (Signed)
Anesthesia Post Note ? ?Patient: Christina Fry ? ?Procedure(s) Performed: INTRAMEDULLARY (IM) NAIL FEMORAL (Left) ? ?  ? ?Patient location during evaluation: PACU ?Anesthesia Type: General ?Level of consciousness: awake and alert, oriented and patient cooperative ?Pain management: pain level controlled ?Vital Signs Assessment: post-procedure vital signs reviewed and stable ?Respiratory status: spontaneous breathing, nonlabored ventilation and respiratory function stable ?Cardiovascular status: blood pressure returned to baseline and stable ?Postop Assessment: no apparent nausea or vomiting ?Anesthetic complications: no ? ? ?No notable events documented. ? ?Last Vitals:  ?Vitals:  ? 07/14/21 1430 07/14/21 1435  ?BP:  (!) 156/74  ?Pulse: (!) 102 (!) 110  ?Resp: (!) 9 13  ?Temp:    ?SpO2: 97% 99%  ?  ?Last Pain:  ?Vitals:  ? 07/14/21 1500  ?TempSrc:   ?PainSc: Asleep  ? ? ?LLE Motor Response: Purposeful movement (07/14/21 1500) ?LLE Sensation: Full sensation (07/14/21 1500) ?  ?  ?  ?  ? ?Jarome Matin Laurelai Lepp ? ? ? ? ?

## 2021-07-14 NOTE — ED Notes (Signed)
Consent signed by daughter electronically in chart ?

## 2021-07-14 NOTE — ED Provider Notes (Addendum)
?[facility] ?Provider Note ? ? ?CSN: [id number] ?Arrival date & time: 07/14/21  2841 ? ?  ? ?History ? ?Chief Complaint  ?Patient presents with  ? Fall  ? ? ?Christina Fry is a 81 y.o. female. ? ?Patient is an 81 year old female with a history of polymyalgia rheumatica, diabetes, asthma, Graves' disease, prior PE on Eliquis presenting today with EMS after a fall that occurred last night.  Patient reports that she is mostly wheelchair-bound and transfers between chairs and wheelchairs in her independent living facility.  She reports for the last few days she is had intermittent episodes of feeling lightheaded and last night she got up to turn and thinks she might of become lightheaded but fell and she was not able to stop herself.  She fell back striking her head onto the hard floor as well as her left leg.  She was unable to get up and laid on the floor from 8 last night until this morning when someone found her.  She reports having a severe headache from where her head hit the floor, neck pain and pain in her left hip and knee.  Patient was given fentanyl by EMS but she reports it did not feel like it did anything.  She does take 5 mg of oxycodone 4 times a day and last had any yesterday afternoon.  She did not have any of her evening meds and last had Eliquis yesterday morning.  She reports it feels difficult for her to lift her arms greater than 90 degrees but denies any numbness or tingling.  Sensation is intact in her lower extremities but she has severe pain anytime she tries to move her leg.  She denies any chest pain, shortness of breath or abdominal pain. ? ?The history is provided by the patient, the EMS personnel and medical records.  ?Fall ? ? ?  ? ?Home Medications ?Prior to Admission medications   ?Medication Sig Start Date End Date Taking? Authorizing Provider  ?acetaminophen (TYLENOL) 325 MG tablet Take 975 mg by mouth 2 (two) times daily.    [provider]  ?acyclovir (ZOVIRAX) 200 MG capsule Take 1 capsule (200 mg total) by mouth 2 (two) times daily. 12/10/19   Shelly Coss, MD  ?albuterol (VENTOLIN HFA) 108 (90 Base) MCG/ACT inhaler Inhale 2 puffs into the lungs every 6 (six) hours as needed for wheezing or shortness of breath.    [provider]  ?apixaban (ELIQUIS) 2.5 MG TABS tablet Take 2.5 mg by mouth 2 (two) times daily.    [provider]  ?atorvastatin (LIPITOR) 40 MG tablet Take 20 mg by mouth daily.    [provider]  ?budesonide (RHINOCORT AQUA) 32 MCG/ACT nasal spray one spray per nostril daily (aim for the ear on each side ?Patient taking differently: 1 spray 2 (two) times daily as needed for rhinitis. 06/29/20   Valentina Shaggy, MD  ?calcium carbonate (TUMS - DOSED IN MG ELEMENTAL CALCIUM) 500 MG chewable tablet Chew 500 mg by mouth 2 (two) times daily.    [provider]  ?calcium-vitamin D (OSCAL WITH D) 500-200 MG-UNIT tablet Take 1 tablet by mouth daily with breakfast. ?Patient not taking: Reported on 04/28/2021 11/30/19   Terrilee Croak, MD  ?cholecalciferol (VITAMIN D3) 25 MCG (1000 UNIT) tablet Take 1,000 Units by mouth daily.    [provider]  ?docusate sodium (COLACE) 100 MG capsule Take 1 capsule (100 mg total) by mouth daily. ?Patient not  taking: Reported on 04/28/2021 08/28/20 08/28/21  Heath Lark D, DO  ?EPINEPHrine 0.3 mg/0.3 mL IJ SOAJ injection Inject 0.3 mg into the muscle as needed for anaphylaxis. ?Patient taking differently: Inject 0.3 mg into the muscle once as needed for anaphylaxis. 04/12/21   Valentina Shaggy, MD  ?feeding supplement, GLUCERNA SHAKE, (GLUCERNA SHAKE) LIQD Take 237 mLs by mouth 3 (three) times daily between meals. ?Patient not taking: Reported on 04/28/2021 08/28/20   Heath Lark D, DO  ?ferrous sulfate 325 (65 FE) MG tablet Take 1 tablet (325 mg total) by mouth daily. ?Patient not taking: Reported on 04/28/2021 08/28/20 08/28/21  Heath Lark D, DO   ?gabapentin (NEURONTIN) 100 MG capsule Take 200 mg by mouth 2 (two) times daily.    [provider]  ?hydrOXYzine (ATARAX/VISTARIL) 10 MG tablet Take 1 tablet (10 mg total) by mouth 2 (two) times daily as needed for itching. 11/29/19   Terrilee Croak, MD  ?levothyroxine (SYNTHROID) 100 MCG tablet Take 1 tablet (100 mcg total) by mouth daily before breakfast. 05/12/21 06/11/21  Elodia Florence., MD  ?lidocaine (LIDODERM) 5 % Place 1 patch onto the skin daily as needed (back pain). Remove & Discard patch within 12 hours 05/11/21   Elodia Florence., MD  ?loratadine (CLARITIN) 10 MG tablet Take 1 tablet (10 mg total) by mouth daily as needed for allergies. ?Patient not taking: Reported on 04/28/2021 06/18/20 08/26/20  Valentina Shaggy, MD  ?melatonin 5 MG TABS Take 5 mg by mouth at bedtime.    [provider]  ?Metoprolol Succinate 50 MG CS24 Take 50 mg by mouth daily.    [provider]  ?NON FORMULARY Inject 1 Dose into the skin once a week. Allergy shots; patient not sure about name    [provider]  ?nortriptyline (PAMELOR) 25 MG capsule Take 50 mg by mouth at bedtime.    [provider]  ?omeprazole (PRILOSEC) 20 MG capsule Take 20 mg by mouth 2 (two) times daily.    [provider]  ?Pediatric Multiple Vitamins (FLINSTONES GUMMIES OMEGA-3 DHA) CHEW Chew 1 tablet by mouth daily.    [provider]  ?Propylene Glycol (SYSTANE COMPLETE OP) Place 1 drop into both eyes See admin instructions. Five times a day    [provider]  ?   ? ?Allergies    ?Iohexol, Codeine, Fentanyl, Morphine and related, Nitrofuran derivatives, and Oxycodone-acetaminophen   ? ?Review of Systems   ?Review of Systems ? ?Physical Exam ?Updated Vital Signs ?BP (!) 159/79   Pulse 87   Temp 98.3 ?F (36.8 ?C) (Oral)   Resp 13   Ht '5\' 3"'$  (1.6 m)   Wt 79.4 kg   SpO2 93%   BMI 31.00 kg/m?  ?Physical Exam ?Vitals and nursing note reviewed.  ?Constitutional:   ?    General: She is not in acute distress. ?   Appearance: She is well-developed.  ?HENT:  ?   Head: Normocephalic and atraumatic.  ?Eyes:  ?   Pupils: Pupils are equal, round, and reactive to light.  ?Neck:  ?   Comments: C-collar in place with patient having midline tenderness ?Cardiovascular:  ?   Rate and Rhythm: Regular rhythm. Tachycardia present.  ?   Pulses: Normal pulses.  ?   Heart sounds: Normal heart sounds. No murmur heard. ?  No friction rub.  ?Pulmonary:  ?   Effort: Pulmonary effort is normal.  ?   Breath sounds: Normal breath sounds. No  wheezing or rales.  ?Abdominal:  ?   General: Bowel sounds are normal. There is no distension.  ?   Palpations: Abdomen is soft.  ?   Tenderness: There is no abdominal tenderness. There is no guarding or rebound.  ?Musculoskeletal:     ?   General: Tenderness and deformity present.  ?   Cervical back: Tenderness present.  ?   Right hip: Normal.  ?   Left hip: Deformity and tenderness present. Decreased range of motion.  ?   Right knee: Normal.  ?   Left knee: Swelling and ecchymosis present. Decreased range of motion. Tenderness present over the medial joint line.  ?   Right ankle: Normal.  ?   Left ankle: Normal.  ?   Comments: No edema  ?Skin: ?   General: Skin is warm and dry.  ?   Findings: No rash.  ?Neurological:  ?   Mental Status: She is alert and oriented to person, place, and time.  ?   Cranial Nerves: No cranial nerve deficit.  ?   Comments: Patient can wiggle toes on both feet without difficulty and sensation is intact.  She is able to lift her right leg but cannot lift the left due to pain.  She is able to lift both arms to 90 degrees but cannot go higher.  Sensation intact in the arms.  Handgrip is 5 out of 5.  ?Psychiatric:     ?   Mood and Affect: Mood normal.     ?   Behavior: Behavior normal.  ? ? ?ED Results / Procedures / Treatments   ?Labs ?(all labs ordered are listed, but only abnormal results are displayed) ?Labs Reviewed  ?COMPREHENSIVE METABOLIC  PANEL - Abnormal; Notable for the following components:  ?    Result Value  ? Glucose, Bld 132 (*)   ? Creatinine, Ser 1.05 (*)   ? Albumin 3.4 (*)   ? GFR, Estimated 53 (*)   ? All other components within normal limi

## 2021-07-14 NOTE — Anesthesia Procedure Notes (Signed)
Procedure Name: Intubation ?Date/Time: 07/14/2021 12:50 PM ?Performed by: Minerva Ends, CRNA ?Pre-anesthesia Checklist: Patient identified, Emergency Drugs available, Suction available and Patient being monitored ?Patient Re-evaluated:Patient Re-evaluated prior to induction ?Oxygen Delivery Method: Circle system utilized ?Preoxygenation: Pre-oxygenation with 100% oxygen ?Induction Type: IV induction ?Ventilation: Mask ventilation without difficulty ?Laryngoscope Size: Mac and 3 ?Grade View: Grade I ?Tube type: Oral ?Tube size: 7.0 mm ?Number of attempts: 1 ?Airway Equipment and Method: Stylet and Oral airway ?Placement Confirmation: ETT inserted through vocal cords under direct vision, positive ETCO2 and breath sounds checked- equal and bilateral ?Secured at: 21 cm ?Tube secured with: Tape ?Dental Injury: Teeth and Oropharynx as per pre-operative assessment  ? ? ? ? ?

## 2021-07-14 NOTE — ED Notes (Signed)
Patient transported to CT 

## 2021-07-14 NOTE — Consult Note (Signed)
Reason for Consult:Left hip fx ?Referring Physician: Blanchie Dessert ?Time called: 1110 ?Time at bedside: 1115 ? ? ?Christina Fry is an 81 y.o. female.  ?HPI: Christina Fry was at home and became lightheaded and fell. She had immediate pain in her left hip and could not get up. Unfortunately none of her call bells worked and she lay there overnight. She was brought to the ED this morning where x-rays showed a left hip fx and orthopedic surgery was consulted. She lives in ALF at [facility] and generally is non-ambulatory and only does transfers. ? ?Past Medical History:  ?Diagnosis Date  ? Anxiety   ? Cancer [facility])   ? Diabetes mellitus without complication ([place])   ? Fibromyalgia   ? GERD (gastroesophageal reflux disease)   ? Grave's disease   ? Herpes   ? Hyperlipidemia   ? Mild intermittent asthma without complication 5/85/2778  ? Palpitation   ? Polymyalgia rheumatica ([place])   ? ? ?Past Surgical History:  ?Procedure Laterality Date  ? ABDOMINAL HYSTERECTOMY    ? APPENDECTOMY    ? CARPAL TUNNEL RELEASE    ? HERNIA REPAIR    ? INCISIONAL HERNIA REPAIR  03/20/2012  ? Procedure: LAPAROSCOPIC INCISIONAL HERNIA;  Surgeon: Jamesetta So, MD;  Location: [facility];  Service: General;  Laterality: N/A;  ? INSERTION OF MESH  03/20/2012  ? Procedure: INSERTION OF MESH;  Surgeon: Jamesetta So, MD;  Location: [facility];  Service: General;  Laterality: N/A;  ? IR KYPHO THORACIC WITH BONE BIOPSY  05/10/2021  ? TOTAL ABDOMINAL HYSTERECTOMY W/ BILATERAL SALPINGOOPHORECTOMY    ? ? ?Family History  ?Problem Relation Age of Onset  ? Allergic rhinitis Neg Hx   ? Asthma Neg Hx   ? Eczema Neg Hx   ? Urticaria Neg Hx   ? Angioedema Neg Hx   ? Atopy Neg Hx   ? Immunodeficiency Neg Hx   ? ? ?Social History:  reports that she has never smoked. She has never used smokeless tobacco. She reports that she does not drink alcohol and does not use drugs. ? ?Allergies:  ?Allergies  ?Allergen Reactions  ? Iohexol Shortness Of Breath  ?  Pt stopped breathing at [place]. Pt refuses IV dye  ? Codeine Itching  ? Fentanyl Other (See Comments)  ?  Agitation & confusion  ? Morphine And Related Itching  ? Nitrofuran Derivatives Other (See Comments)  ?  Unknown  ? Oxycodone-Acetaminophen Itching  ? ? ?Medications: I have reviewed the patient's current medications. ? ?Results for orders placed or performed during the hospital encounter of 07/14/21 (from the past 48 hour(s))  ?Resp Panel by RT-PCR (Flu A&B, Covid) Nasopharyngeal Swab     Status: None  ? Collection Time: 07/14/21  9:38 AM  ? Specimen: Nasopharyngeal Swab; Nasopharyngeal(NP) swabs in vial transport medium  ?Result Value Ref Range  ? SARS Coronavirus 2 by RT PCR NEGATIVE NEGATIVE  ?  Comment: (NOTE) ?SARS-CoV-2 target nucleic acids are NOT DETECTED. ? ?The SARS-CoV-2 RNA is generally detectable in upper respiratory ?specimens during the acute phase of infection. The lowest ?concentration of SARS-CoV-2 viral copies this assay can detect is ?138 copies/mL. A negative result does not preclude SARS-Cov-2 ?infection and should not be used as the sole basis for treatment or ?other patient management decisions. A negative result may occur with  ?improper specimen collection/handling, submission of specimen other ?than nasopharyngeal swab, presence of viral mutation(s) within the ?areas targeted by this assay, and inadequate  number of viral ?copies(<138 copies/mL). A negative result must be combined with ?clinical observations, patient history, and epidemiological ?information. The expected result is Negative. ? ?Fact Sheet for Patients:  ?EntrepreneurPulse.com.au ? ?Fact Sheet for Healthcare Providers:  ?IncredibleEmployment.be ? ?This test is no t yet approved or cleared by the [place] FDA and  ?has been authorized for detection and/or diagnosis of SARS-CoV-2 by ?FDA under an Emergency Use Authorization (EUA). This EUA will remain  ?in effect (meaning this test can be used) for the  duration of the ?COVID-19 declaration under Section 564(b)(1) of the Act, 21 ?U.S.C.section 360bbb-3(b)(1), unless the authorization is terminated  ?or revoked sooner.  ? ? ?  ? Influenza A by PCR NEGATIVE NEGATIVE  ? Influenza B by PCR NEGATIVE NEGATIVE  ?  Comment: (NOTE) ?The Xpert Xpress SARS-CoV-2/FLU/RSV plus assay is intended as an aid ?in the diagnosis of influenza from Nasopharyngeal swab specimens and ?should not be used as a sole basis for treatment. Nasal washings and ?aspirates are unacceptable for Xpert Xpress SARS-CoV-2/FLU/RSV ?testing. ? ?Fact Sheet for Patients: ?EntrepreneurPulse.com.au ? ?Fact Sheet for Healthcare Providers: ?IncredibleEmployment.be ? ?This test is not yet approved or cleared by the [place] FDA and ?has been authorized for detection and/or diagnosis of SARS-CoV-2 by ?FDA under an Emergency Use Authorization (EUA). This EUA will remain ?in effect (meaning this test can be used) for the duration of the ?COVID-19 declaration under Section 564(b)(1) of the Act, 21 U.S.C. ?section 360bbb-3(b)(1), unless the authorization is terminated or ?revoked. ? ?Performed at [facility], [place] [address] [place], [place] ?[address] ?  ?Sample to Blood Bank     Status: None  ? Collection Time: 07/14/21  9:50 AM  ?Result Value Ref Range  ? Blood Bank Specimen SAMPLE AVAILABLE FOR TESTING   ? Sample Expiration    ?  07/15/2021,2359 ?Performed at [facility], [place] [address] [place], [place] [address] ?  ?I-Stat Chem 8, ED     Status: Abnormal  ? Collection Time: 07/14/21 10:04 AM  ?Result Value Ref Range  ? Sodium 139 135 - 145 mmol/L  ? Potassium 4.6 3.5 - 5.1 mmol/L  ? Chloride 103 98 - 111 mmol/L  ? BUN 15 8 - 23 mg/dL  ? Creatinine, Ser 1.00 0.44 - 1.00 mg/dL  ? Glucose, Bld 129 (H) 70 - 99 mg/dL  ?  Comment: Glucose reference range applies only to samples taken after fasting for at least 8 hours.  ? Calcium, Ion 1.15 1.15 - 1.40 mmol/L  ?  TCO2 28 22 - 32 mmol/L  ? Hemoglobin 14.6 12.0 - 15.0 g/dL  ? HCT 43.0 36.0 - 46.0 %  ?Comprehensive metabolic panel     Status: Abnormal  ? Collection Time: 07/14/21 10:05 AM  ?Result Value Ref Range  ? Sodium 140 135 - 145 mmol/L  ? Potassium 4.1 3.5 - 5.1 mmol/L  ? Chloride 104 98 - 111 mmol/L  ? CO2 27 22 - 32 mmol/L  ? Glucose, Bld 132 (H) 70 - 99 mg/dL  ?  Comment: Glucose reference range applies only to samples taken after fasting for at least 8 hours.  ? BUN 11 8 - 23 mg/dL  ? Creatinine, Ser 1.05 (H) 0.44 - 1.00 mg/dL  ? Calcium 9.6 8.9 - 10.3 mg/dL  ? Total Protein 6.7 6.5 - 8.1 g/dL  ? Albumin 3.4 (L) 3.5 - 5.0 g/dL  ? AST 26 15 - 41 U/L  ? ALT 18 0 -  44 U/L  ? Alkaline Phosphatase 93 38 - 126 U/L  ? Total Bilirubin 0.5 0.3 - 1.2 mg/dL  ? GFR, Estimated 53 (L) >60 mL/min  ?  Comment: (NOTE) ?Calculated using the CKD-EPI Creatinine Equation (2021) ?  ? Anion gap 9 5 - 15  ?  Comment: Performed at [facility], [place] [address] [place], [place] [address]  ?CBC     Status: Abnormal  ? Collection Time: 07/14/21 10:05 AM  ?Result Value Ref Range  ? WBC 18.2 (H) 4.0 - 10.5 K/uL  ? RBC 4.27 3.87 - 5.11 MIL/uL  ? Hemoglobin 12.9 12.0 - 15.0 g/dL  ? HCT 41.6 36.0 - 46.0 %  ? MCV 97.4 80.0 - 100.0 fL  ? MCH 30.2 26.0 - 34.0 pg  ? MCHC 31.0 30.0 - 36.0 g/dL  ? RDW 15.4 11.5 - 15.5 %  ? Platelets 273 150 - 400 K/uL  ? nRBC 0.0 0.0 - 0.2 %  ?  Comment: Performed at [facility], [place] [address], [place], [place] [address]  ?Ethanol     Status: None  ? Collection Time: 07/14/21 10:05 AM  ?Result Value Ref Range  ? Alcohol, Ethyl (B) <10 <10 mg/dL  ?  Comment: (NOTE) ?Lowest detectable limit for serum alcohol is 10 mg/dL. ? ?For medical purposes only. ?Performed at [facility], [place] [address] [place], [place] ?[address] ?  ?Lactic acid, plasma     Status: Abnormal  ? Collection Time: 07/14/21 10:05 AM  ?Result Value Ref Range  ? Lactic Acid, Venous 2.3 (HH) 0.5 - 1.9 mmol/L  ?  Comment: CRITICAL RESULT  CALLED TO, READ BACK BY AND VERIFIED WITH: ?MEGAN ROBERTS RN.'@[email or web address]'$  ON 5.3.23 BY TCALDWELL MT. ?Performed at [facility], [place] [address], [place], [place] [address] ?  ?Protime-INR     Status: None

## 2021-07-14 NOTE — Telephone Encounter (Signed)
[place] to check on status of authorization request. Was told by representative that it had been faxed to our office at [phone] on 06-25-2021. I let representative know that is not any of our offices' fax number. I provided representative with our [place] fax number, [phone] ? ?

## 2021-07-14 NOTE — H&P (Signed)
? ?                                                                                                       TRH H&P ? ? Patient Demographics:  ? ? Christina Fry, is a 81 y.o. female  MRN: [id number]   DOB - [birth year] ? ?Admit Date - 07/14/2021 ? ?Outpatient Primary MD for the patient is D'Silva, Belva Crome, MD ? ?Referring MD/NP/PA: Dr Maryan Rued ? ?Patient coming from: [facility] [employer] independent level ? ?Chief Complaint  ?Patient presents with  ? Fall  ?  ? ? HPI:  ? ? Christina Fry  is a 81 y.o. female, with past medical history of polymyalgia rheumatica, diabetes, asthma, Graves' disease, prior PE on Eliquis, patient presents to ED secondary to fall, patient report fall happened last time, she is mostly wheelchair bound, transfers between chair/bed and wheelchair at her independent living facility, reports last night she was trying to go from wheelchair to chair, she fell back striking her head into the hard floor, as well as her left leg, she was unable to stand up, or to call for help, and laid on the floor from 8 last night until this morning when she was found by staff, and reports severe headache from where she hit her head on the floor, neck pain, left hip pain, left knee pain, she was given fentanyl by EMS, she denies any focal deficits, no fever, no chills,. ?-In ED no significant lab abnormalities, CT head with no evidence of acute findings, findings significant for left hip fracture, ED discussed with orthopedic Dr. Doreatha Martin, who reports plan for surgery today, last dose of Eliquis was yesterday morning. ? ? ? Review of systems:  ?  ?In addition to the HPI above,  ? ?A full 10 point Review of Systems was done, except as stated above, all other Review of Systems were negative. ? ? ?With Past History of the following :  ? ? ?Past Medical History:  ?Diagnosis Date  ? Anxiety   ? Cancer [facility])   ? Diabetes mellitus without complication ([place])   ? Fibromyalgia   ? GERD (gastroesophageal reflux disease)   ? Grave's disease   ?  Herpes   ? Hyperlipidemia   ? Mild intermittent asthma without complication 12/05/2681  ? Palpitation   ? Polymyalgia rheumatica ([place])   ?   ? ?Past Surgical History:  ?Procedure Laterality Date  ? ABDOMINAL HYSTERECTOMY    ? APPENDECTOMY    ? CARPAL TUNNEL RELEASE    ? HERNIA REPAIR    ? INCISIONAL HERNIA REPAIR  03/20/2012  ? Procedure: LAPAROSCOPIC INCISIONAL HERNIA;  Surgeon: Jamesetta So, MD;  Location: [facility];  Service: General;  Laterality: N/A;  ? INSERTION OF MESH  03/20/2012  ? Procedure: INSERTION OF MESH;  Surgeon: Jamesetta So, MD;  Location: [facility];  Service: General;  Laterality: N/A;  ? IR KYPHO THORACIC WITH BONE BIOPSY  05/10/2021  ? TOTAL ABDOMINAL HYSTERECTOMY W/ BILATERAL SALPINGOOPHORECTOMY    ? ? ? ? Social History:  ? ?  ?  Social History  ? ?Tobacco Use  ? Smoking status: Never  ? Smokeless tobacco: Never  ?Substance Use Topics  ? Alcohol use: No  ?  ? ?Lives -at [facility] independent level ? ?Mobility -wheelchair ? ? ? Family History :  ? ?  ?Family History  ?Problem Relation Age of Onset  ? Allergic rhinitis Neg Hx   ? Asthma Neg Hx   ? Eczema Neg Hx   ? Urticaria Neg Hx   ? Angioedema Neg Hx   ? Atopy Neg Hx   ? Immunodeficiency Neg Hx   ? ? ? ? Home Medications:  ? ?Prior to Admission medications   ?Medication Sig Start Date End Date Taking? Authorizing Provider  ?acetaminophen (TYLENOL) 325 MG tablet Take 975 mg by mouth 2 (two) times daily.    [provider]  ?acyclovir (ZOVIRAX) 200 MG capsule Take 1 capsule (200 mg total) by mouth 2 (two) times daily. 12/10/19   Shelly Coss, MD  ?albuterol (VENTOLIN HFA) 108 (90 Base) MCG/ACT inhaler Inhale 2 puffs into the lungs every 6 (six) hours as needed for wheezing or shortness of breath.    [provider]  ?apixaban (ELIQUIS) 2.5 MG TABS tablet Take 2.5 mg by mouth 2 (two) times daily.    [provider]  ?atorvastatin (LIPITOR) 40 MG tablet Take 20 mg by mouth daily.    [provider]  ?budesonide  (RHINOCORT AQUA) 32 MCG/ACT nasal spray one spray per nostril daily (aim for the ear on each side ?Patient taking differently: 1 spray 2 (two) times daily as needed for rhinitis. 06/29/20   Valentina Shaggy, MD  ?calcium carbonate (TUMS - DOSED IN MG ELEMENTAL CALCIUM) 500 MG chewable tablet Chew 500 mg by mouth 2 (two) times daily.    [provider]  ?calcium-vitamin D (OSCAL WITH D) 500-200 MG-UNIT tablet Take 1 tablet by mouth daily with breakfast. ?Patient not taking: Reported on 04/28/2021 11/30/19   Terrilee Croak, MD  ?cholecalciferol (VITAMIN D3) 25 MCG (1000 UNIT) tablet Take 1,000 Units by mouth daily.    [provider]  ?docusate sodium (COLACE) 100 MG capsule Take 1 capsule (100 mg total) by mouth daily. ?Patient not taking: Reported on 04/28/2021 08/28/20 08/28/21  Heath Lark D, DO  ?EPINEPHrine 0.3 mg/0.3 mL IJ SOAJ injection Inject 0.3 mg into the muscle as needed for anaphylaxis. ?Patient taking differently: Inject 0.3 mg into the muscle once as needed for anaphylaxis. 04/12/21   Valentina Shaggy, MD  ?feeding supplement, GLUCERNA SHAKE, (GLUCERNA SHAKE) LIQD Take 237 mLs by mouth 3 (three) times daily between meals. ?Patient not taking: Reported on 04/28/2021 08/28/20   Heath Lark D, DO  ?ferrous sulfate 325 (65 FE) MG tablet Take 1 tablet (325 mg total) by mouth daily. ?Patient not taking: Reported on 04/28/2021 08/28/20 08/28/21  Heath Lark D, DO  ?gabapentin (NEURONTIN) 100 MG capsule Take 200 mg by mouth 2 (two) times daily.    [provider]  ?hydrOXYzine (ATARAX/VISTARIL) 10 MG tablet Take 1 tablet (10 mg total) by mouth 2 (two) times daily as needed for itching. 11/29/19   Terrilee Croak, MD  ?levothyroxine (SYNTHROID) 100 MCG tablet Take 1 tablet (100 mcg total) by mouth daily before breakfast. 05/12/21 06/11/21  Elodia Florence., MD  ?lidocaine (LIDODERM) 5 % Place 1 patch onto the skin daily as needed (back pain). Remove & Discard patch within 12 hours  05/11/21   Elodia Florence., MD  ?loratadine [facility])  10 MG tablet Take 1 tablet (10 mg total) by mouth daily as needed for allergies. ?Patient not taking: Reported on 04/28/2021 06/18/20 08/26/20  Valentina Shaggy, MD  ?melatonin 5 MG TABS Take 5 mg by mouth at bedtime.    [provider]  ?Metoprolol Succinate 50 MG CS24 Take 50 mg by mouth daily.    [provider]  ?NON FORMULARY Inject 1 Dose into the skin once a week. Allergy shots; patient not sure about name    [provider]  ?nortriptyline (PAMELOR) 25 MG capsule Take 50 mg by mouth at bedtime.    [provider]  ?omeprazole (PRILOSEC) 20 MG capsule Take 20 mg by mouth 2 (two) times daily.    [provider]  ?Pediatric Multiple Vitamins (FLINSTONES GUMMIES OMEGA-3 DHA) CHEW Chew 1 tablet by mouth daily.    [provider]  ?Propylene Glycol (SYSTANE COMPLETE OP) Place 1 drop into both eyes See admin instructions. Five times a day    [provider]  ? ? ? Allergies:  ? ?  ?Allergies  ?Allergen Reactions  ? Iohexol Shortness Of Breath  ?  Pt stopped breathing at [place]. Pt refuses IV dye  ? Codeine Itching  ? Fentanyl Other (See Comments)  ?  Agitation & confusion  ? Morphine And Related Itching  ? Nitrofuran Derivatives Other (See Comments)  ?  Unknown  ? Oxycodone-Acetaminophen Itching  ? ? ? Physical Exam:  ? ?Vitals ? ?Blood pressure (!) 167/77, pulse 100, temperature 98.3 ?F (36.8 ?C), temperature source Oral, resp. rate (!) 23, height '5\' 3"'$  (1.6 m), weight 79.4 kg, SpO2 99 %. ? ? ?1. General frail elderly female, laying in bed, no apparent distress ? ?2. Normal affect and insight, Not Suicidal or Homicidal, Awake Alert, Oriented X 3. ? ?3. No F.N deficits, ALL C.Nerves Intact, Strength 5/5 all 4 extremities, Sensation intact all 4 extremities, Plantars down going. ? ?4. Ears and Eyes appear Normal, Conjunctivae clear, PERRLA. Moist Oral Mucosa. ? ?5. Supple Neck, No JVD, No  cervical lymphadenopathy appriciated, No Carotid Bruits. ? ?6. Symmetrical Chest wall movement, Good air movement bilaterally, CTAB. ? ?7. RRR, No Gallops, Rubs or Murmurs, No Parasternal Heave. ? ?8. P

## 2021-07-14 NOTE — H&P (View-Only) (Signed)
Reason for Consult:Left hip fx ?Referring Physician: Blanchie Dessert ?Time called: 1110 ?Time at bedside: 1115 ? ? ?Monalisa Bayless Haris is an 81 y.o. female.  ?HPI: Christina Fry was at home and became lightheaded and fell. She had immediate pain in her left hip and could not get up. Unfortunately none of her call bells worked and she lay there overnight. She was brought to the ED this morning where x-rays showed a left hip fx and orthopedic surgery was consulted. She lives in ALF at [facility] and generally is non-ambulatory and only does transfers. ? ?Past Medical History:  ?Diagnosis Date  ? Anxiety   ? Cancer [facility])   ? Diabetes mellitus without complication ([place])   ? Fibromyalgia   ? GERD (gastroesophageal reflux disease)   ? Grave's disease   ? Herpes   ? Hyperlipidemia   ? Mild intermittent asthma without complication 06/20/8117  ? Palpitation   ? Polymyalgia rheumatica ([place])   ? ? ?Past Surgical History:  ?Procedure Laterality Date  ? ABDOMINAL HYSTERECTOMY    ? APPENDECTOMY    ? CARPAL TUNNEL RELEASE    ? HERNIA REPAIR    ? INCISIONAL HERNIA REPAIR  03/20/2012  ? Procedure: LAPAROSCOPIC INCISIONAL HERNIA;  Surgeon: Jamesetta So, MD;  Location: [facility];  Service: General;  Laterality: N/A;  ? INSERTION OF MESH  03/20/2012  ? Procedure: INSERTION OF MESH;  Surgeon: Jamesetta So, MD;  Location: [facility];  Service: General;  Laterality: N/A;  ? IR KYPHO THORACIC WITH BONE BIOPSY  05/10/2021  ? TOTAL ABDOMINAL HYSTERECTOMY W/ BILATERAL SALPINGOOPHORECTOMY    ? ? ?Family History  ?Problem Relation Age of Onset  ? Allergic rhinitis Neg Hx   ? Asthma Neg Hx   ? Eczema Neg Hx   ? Urticaria Neg Hx   ? Angioedema Neg Hx   ? Atopy Neg Hx   ? Immunodeficiency Neg Hx   ? ? ?Social History:  reports that she has never smoked. She has never used smokeless tobacco. She reports that she does not drink alcohol and does not use drugs. ? ?Allergies:  ?Allergies  ?Allergen Reactions  ? Iohexol Shortness Of Breath  ?  Pt stopped breathing at [place]. Pt refuses IV dye  ? Codeine Itching  ? Fentanyl Other (See Comments)  ?  Agitation & confusion  ? Morphine And Related Itching  ? Nitrofuran Derivatives Other (See Comments)  ?  Unknown  ? Oxycodone-Acetaminophen Itching  ? ? ?Medications: I have reviewed the patient's current medications. ? ?Results for orders placed or performed during the hospital encounter of 07/14/21 (from the past 48 hour(s))  ?Resp Panel by RT-PCR (Flu A&B, Covid) Nasopharyngeal Swab     Status: None  ? Collection Time: 07/14/21  9:38 AM  ? Specimen: Nasopharyngeal Swab; Nasopharyngeal(NP) swabs in vial transport medium  ?Result Value Ref Range  ? SARS Coronavirus 2 by RT PCR NEGATIVE NEGATIVE  ?  Comment: (NOTE) ?SARS-CoV-2 target nucleic acids are NOT DETECTED. ? ?The SARS-CoV-2 RNA is generally detectable in upper respiratory ?specimens during the acute phase of infection. The lowest ?concentration of SARS-CoV-2 viral copies this assay can detect is ?138 copies/mL. A negative result does not preclude SARS-Cov-2 ?infection and should not be used as the sole basis for treatment or ?other patient management decisions. A negative result may occur with  ?improper specimen collection/handling, submission of specimen other ?than nasopharyngeal swab, presence of viral mutation(s) within the ?areas targeted by this assay, and inadequate  number of viral ?copies(<138 copies/mL). A negative result must be combined with ?clinical observations, patient history, and epidemiological ?information. The expected result is Negative. ? ?Fact Sheet for Patients:  ?EntrepreneurPulse.com.au ? ?Fact Sheet for Healthcare Providers:  ?IncredibleEmployment.be ? ?This test is no t yet approved or cleared by the [place] FDA and  ?has been authorized for detection and/or diagnosis of SARS-CoV-2 by ?FDA under an Emergency Use Authorization (EUA). This EUA will remain  ?in effect (meaning this test can be used) for the  duration of the ?COVID-19 declaration under Section 564(b)(1) of the Act, 21 ?U.S.C.section 360bbb-3(b)(1), unless the authorization is terminated  ?or revoked sooner.  ? ? ?  ? Influenza A by PCR NEGATIVE NEGATIVE  ? Influenza B by PCR NEGATIVE NEGATIVE  ?  Comment: (NOTE) ?The Xpert Xpress SARS-CoV-2/FLU/RSV plus assay is intended as an aid ?in the diagnosis of influenza from Nasopharyngeal swab specimens and ?should not be used as a sole basis for treatment. Nasal washings and ?aspirates are unacceptable for Xpert Xpress SARS-CoV-2/FLU/RSV ?testing. ? ?Fact Sheet for Patients: ?EntrepreneurPulse.com.au ? ?Fact Sheet for Healthcare Providers: ?IncredibleEmployment.be ? ?This test is not yet approved or cleared by the [place] FDA and ?has been authorized for detection and/or diagnosis of SARS-CoV-2 by ?FDA under an Emergency Use Authorization (EUA). This EUA will remain ?in effect (meaning this test can be used) for the duration of the ?COVID-19 declaration under Section 564(b)(1) of the Act, 21 U.S.C. ?section 360bbb-3(b)(1), unless the authorization is terminated or ?revoked. ? ?Performed at [facility], [place] [address], [place], [place] ?[address] ?  ?Sample to Blood Bank     Status: None  ? Collection Time: 07/14/21  9:50 AM  ?Result Value Ref Range  ? Blood Bank Specimen SAMPLE AVAILABLE FOR TESTING   ? Sample Expiration    ?  07/15/2021,2359 ?Performed at [facility], [place] [address] [place],  [address] ?  ?I-Stat Chem 8, ED     Status: Abnormal  ? Collection Time: 07/14/21 10:04 AM  ?Result Value Ref Range  ? Sodium 139 135 - 145 mmol/L  ? Potassium 4.6 3.5 - 5.1 mmol/L  ? Chloride 103 98 - 111 mmol/L  ? BUN 15 8 - 23 mg/dL  ? Creatinine, Ser 1.00 0.44 - 1.00 mg/dL  ? Glucose, Bld 129 (H) 70 - 99 mg/dL  ?  Comment: Glucose reference range applies only to samples taken after fasting for at least 8 hours.  ? Calcium, Ion 1.15 1.15 - 1.40 mmol/L  ?  TCO2 28 22 - 32 mmol/L  ? Hemoglobin 14.6 12.0 - 15.0 g/dL  ? HCT 43.0 36.0 - 46.0 %  ?Comprehensive metabolic panel     Status: Abnormal  ? Collection Time: 07/14/21 10:05 AM  ?Result Value Ref Range  ? Sodium 140 135 - 145 mmol/L  ? Potassium 4.1 3.5 - 5.1 mmol/L  ? Chloride 104 98 - 111 mmol/L  ? CO2 27 22 - 32 mmol/L  ? Glucose, Bld 132 (H) 70 - 99 mg/dL  ?  Comment: Glucose reference range applies only to samples taken after fasting for at least 8 hours.  ? BUN 11 8 - 23 mg/dL  ? Creatinine, Ser 1.05 (H) 0.44 - 1.00 mg/dL  ? Calcium 9.6 8.9 - 10.3 mg/dL  ? Total Protein 6.7 6.5 - 8.1 g/dL  ? Albumin 3.4 (L) 3.5 - 5.0 g/dL  ? AST 26 15 - 41 U/L  ? ALT 18 0 -  44 U/L  ? Alkaline Phosphatase 93 38 - 126 U/L  ? Total Bilirubin 0.5 0.3 - 1.2 mg/dL  ? GFR, Estimated 53 (L) >60 mL/min  ?  Comment: (NOTE) ?Calculated using the CKD-EPI Creatinine Equation (2021) ?  ? Anion gap 9 5 - 15  ?  Comment: Performed at [facility], [place] [address], [place], [place] [address]  ?CBC     Status: Abnormal  ? Collection Time: 07/14/21 10:05 AM  ?Result Value Ref Range  ? WBC 18.2 (H) 4.0 - 10.5 K/uL  ? RBC 4.27 3.87 - 5.11 MIL/uL  ? Hemoglobin 12.9 12.0 - 15.0 g/dL  ? HCT 41.6 36.0 - 46.0 %  ? MCV 97.4 80.0 - 100.0 fL  ? MCH 30.2 26.0 - 34.0 pg  ? MCHC 31.0 30.0 - 36.0 g/dL  ? RDW 15.4 11.5 - 15.5 %  ? Platelets 273 150 - 400 K/uL  ? nRBC 0.0 0.0 - 0.2 %  ?  Comment: Performed at [facility], [place] [address], [place], [place] [address]  ?Ethanol     Status: None  ? Collection Time: 07/14/21 10:05 AM  ?Result Value Ref Range  ? Alcohol, Ethyl (B) <10 <10 mg/dL  ?  Comment: (NOTE) ?Lowest detectable limit for serum alcohol is 10 mg/dL. ? ?For medical purposes only. ?Performed at [facility], [place] [address], [place], [place] ?[address] ?  ?Lactic acid, plasma     Status: Abnormal  ? Collection Time: 07/14/21 10:05 AM  ?Result Value Ref Range  ? Lactic Acid, Venous 2.3 (HH) 0.5 - 1.9 mmol/L  ?  Comment: CRITICAL RESULT  CALLED TO, READ BACK BY AND VERIFIED WITH: ?MEGAN ROBERTS RN.'@[email or web address]'$  ON 5.3.23 BY TCALDWELL MT. ?Performed at [facility], [place] [address], [place], [place] [address] ?  ?Protime-INR     Status: None

## 2021-07-14 NOTE — Progress Notes (Signed)
Responded to page to support pt that fail on thinner and hit head.  Provided emotional and spiritual support.  Family at bedside.  Will follow as needed. ? ?Cristopher Peru, [facility], Pager [phone]   ?

## 2021-07-14 NOTE — Interval H&P Note (Signed)
History and Physical Interval Note: ? ?07/14/2021 ?12:36 PM ? ?Christina Fry  has presented today for surgery, with the diagnosis of Left Fracture.  The various methods of treatment have been discussed with the patient and family. After consideration of risks, benefits and other options for treatment, the patient has consented to  Procedure(s): ?INTRAMEDULLARY (IM) NAIL FEMORAL (Left) as a surgical intervention.  The patient's history has been reviewed, patient examined, no change in status, stable for surgery.  I have reviewed the patient's chart and labs.  Questions were answered to the patient's satisfaction.   ? ? ?Lennette Bihari P Gabryelle Whitmoyer ? ? ?

## 2021-07-14 NOTE — Anesthesia Preprocedure Evaluation (Addendum)
Anesthesia Evaluation  ?Patient identified by MRN, date of birth, ID band ?Patient awake ? ? ? ?Reviewed: ?Allergy & Precautions, NPO status , Patient's Chart, lab work & pertinent test results, reviewed documented beta blocker date and time  ? ?Airway ?Mallampati: III ? ?TM Distance: >3 FB ?Neck ROM: Full ? ? ? Dental ? ?(+) Dental Advisory Given ?  ?Pulmonary ?asthma ,  ?  ?Pulmonary exam normal ?breath sounds clear to auscultation ? ? ? ? ? ? Cardiovascular ?hypertension, Pt. on medications and Pt. on home beta blockers ?+CHF (grade 1 diastolic dysfunction)  ?Normal cardiovascular exam+ Valvular Problems/Murmurs (mild AI) AI  ?Rhythm:Regular Rate:Normal ? ?Echo 08/2020 ?1. Left ventricular ejection fraction, by estimation, is 60 to 65%. The  ?left ventricle has normal function. The left ventricle has no regional  ?wall motion abnormalities. There is moderate left ventricular hypertrophy.  ?Left ventricular diastolic  ?parameters are consistent with Grade I diastolic dysfunction (impaired  ?relaxation).  ??2. Right ventricular systolic function is normal. The right ventricular  ?size is normal.  ??3. Left atrial size was moderately dilated.  ??4. The mitral valve is normal in structure. No evidence of mitral valve  ?regurgitation. No evidence of mitral stenosis.  ??5. The aortic valve is tricuspid. Aortic valve regurgitation is mild. No  ?aortic stenosis is present.  ??6. The inferior vena cava is normal in size with greater than 50%  ?respiratory variability, suggesting right atrial pressure of 3 mmHg.  ?  ?Neuro/Psych ?PSYCHIATRIC DISORDERS Anxiety negative neurological ROS ?   ? GI/Hepatic ?Neg liver ROS, GERD  Controlled,  ?Endo/Other  ?diabetesHypothyroidism  ? Renal/GU ?Renal InsufficiencyRenal diseaseCr 1.05  ?negative genitourinary ?  ?Musculoskeletal ? 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 Abdominal ?  ?Peds ?negative pediatric ROS ?(+)   Hematology ?negative hematology ROS ?(+) Hb 12.9, plt 273   ?Anesthesia Other Findings ?eliquis LD 5/2 AM ? Reproductive/Obstetrics ?negative OB ROS ? ?  ? ? ? ? ? ? ? ? ? ? ? ? ? ?  ?  ? ? ? ? ? ? ? ?Anesthesia Physical ?Anesthesia Plan ? ?ASA: 3 ? ?Anesthesia Plan: General  ? ?Post-op Pain Management: Tylenol PO (pre-op)*  ? ?Induction: Intravenous ? ?PONV Risk Score and Plan: 3 and Ondansetron, Dexamethasone and Treatment may vary due to age or medical condition ? ?Airway Management Planned: Oral ETT ? ?Additional Equipment: None ? ?Intra-op Plan:  ? ?Post-operative Plan: Extubation in OR ? ?Informed Consent: I have reviewed the patients History and Physical, chart, labs and discussed the procedure including the risks, benefits and alternatives for the proposed anesthesia with the patient or authorized representative who has indicated his/her understanding and acceptance.  ? ? ? ?Dental advisory given ? ?Plan Discussed with: CRNA ? ?Anesthesia Plan Comments: (Has only been off eliquis about 30h, will proceed w/ GA )  ? ? ? ? ? ?Anesthesia Quick Evaluation ? ?

## 2021-07-14 NOTE — Transfer of Care (Signed)
Immediate Anesthesia Transfer of Care Note ? ?Patient: Christina Fry ? ?Procedure(s) Performed: INTRAMEDULLARY (IM) NAIL FEMORAL (Left) ? ?Patient Location: PACU ? ?Anesthesia Type:General ? ?Level of Consciousness: awake, alert  and oriented ? ?Airway & Oxygen Therapy: Patient Spontanous Breathing and Patient connected to nasal cannula oxygen ? ?Post-op Assessment: Report given to RN and Post -op Vital signs reviewed and stable ? ?Post vital signs: Reviewed and stable ? ?Last Vitals:  ?Vitals Value Taken Time  ?BP 136/53 07/14/21 1402  ?Temp    ?Pulse 101 07/14/21 1406  ?Resp 15 07/14/21 1406  ?SpO2 97 % 07/14/21 1406  ?Vitals shown include unvalidated device data. ? ?Last Pain:  ?Vitals:  ? 07/14/21 1232  ?TempSrc: Oral  ?PainSc:   ?   ? ?  ? ?Complications: No notable events documented. ?

## 2021-07-14 NOTE — CV Procedure (Deleted)
Orthopaedic Surgery Operative Note (CSN: [id number] ) ?Date of Surgery: 07/14/2021  ?Admit Date: 07/14/2021  ? ?Diagnoses: ?Pre-Op Diagnoses: ?Left intertrochanteric femur fracture ? ?Post-Op Diagnosis: ?Same ? ?Procedures: ?CPT 27245-Cephalomedullary nailing of left intertrochanteric femur fracture ? ?Surgeons : ?Primary: Mariaha Ellington P, MD ? ?Assistant: Sarah Yacobi, PA-C ? ?Location: OR 7  ? ?Anesthesia:General  ? ?Antibiotics: Ancef 2g preop  ? ?Tourniquet time: None   ? ?Estimated Blood Loss: 50 mL ? ?Complications:None ? ?Specimens:None  ? ?Implants: ?Implant Name Type Inv. Item Serial No. Manufacturer Lot No. LRB No. Used Action  ?SCREW LAG COMPR KIT 90/85 - [id number] Screw SCREW LAG COMPR KIT 90/85  [employer] [id number] Left 1 Implanted  ?NAIL INTERTAN 10X18 130D 10S - [id number] Nail NAIL INTERTAN 10X18 130D 10S  [employer] [id number] Left 1 Implanted  ?SCREW TRIGEN LOW PROF 5.0X32.5 - [id number] Screw SCREW TRIGEN LOW PROF 5.0X32.5  [employer] [id number] Left 1 Implanted  ?  ? ?Indications for Surgery: ?81-year-old female who had a ground-level fall with a left intertrochanteric femur fracture.  Due to the unstable nature of her injury I recommend proceeding with cephalomedullary nailing.  Risks and benefits were discussed with the patient and her daughter.  Risks included but not limited to bleeding, infection, malunion, nonunion, hardware failure, hardware irritation, nerve and blood vessel injury, DVT, even the possible anesthetic complications.  She agreed to proceed with surgery and consent was obtained. ? ?Operative Findings: ?Cephalomedullary nailing of left intertrochanteric femur fracture using [employer] InterTAN 10 x 180 mm nail with a 90 mm / 85 mm lag screw combo ? ?Procedure: ?The patient was identified in the preoperative holding area. Consent was confirmed with the patient and their family and all questions were answered. The operative  extremity was marked after confirmation with the patient. she was then brought back to the operating room by our anesthesia colleagues.  She was placed under general anesthetic and carefully transferred over to a Hana table.  All bony prominences were well-padded.  Traction was applied to the lower extremity and fluoroscopic imaging showed adequate alignment.  The left lower extremity was then prepped and draped in usual sterile fashion.  A timeout was performed to verify the patient, the procedure, and the extremity.  Preoperative antibiotics were dosed. ? ?Small incision proximal to the greater trochanter was made and carried down through skin and subcutaneous tissue.  Threaded guidewire was directed at the tip of the greater trochanter and advanced into the proximal metaphysis.  Positioning was confirmed with fluoroscopy.  I then used an entry reamer to enter the medullary canal.  I then passed a 10 x 180 mm InterTAN down the center of the canal attached to the targeting arm.  I then used the targeting arm to make a percutaneous incision and directed a threaded guidewire up into the head/neck segment.  I confirmed adequate tip apex distance and measured the length.  I decided to place a 90 mm screw.  I then drilled the path for the compression screw and placed an antirotation bar.  I then placed the lag screw and then placed the compression screw and compressed approximately 5 mm.  The proximal portion of the nail was statically locked. ? ?I used the targeting arm to place a distal interlocking screw.  The targeting arm was removed.  Final fluoroscopic imaging was obtained.  The incision was copiously irrigated.  A layered closure of 2-0 Vicryl and 3-0 Monocryl with Dermabond   was used to close the skin.  Sterile dressings were applied.  The patient was awoken from anesthesia and taken to the PACU in stable condition. ? ?Post Op Plan/Instructions: ?The patient will be weightbearing as tolerated to the left lower  extremity.  She will receive postoperative Ancef.  She may be restarted on her anticoagulation tomorrow if her hemoglobin is stable.  We will have her mobilize with physical and Occupational Therapy. ? ?I was present and performed the entire surgery. ? ?Sarah Yacobi, PA-C did assist me throughout the case. An assistant was necessary given the difficulty in approach, maintenance of reduction and ability to instrument the fracture. ? ? ?Kelaiah Escalona, MD ?[facility]  ?

## 2021-07-14 NOTE — ED Triage Notes (Addendum)
Patient fell and is on thinners.  Fell yesterday 2200 yesterday and has been on the floor since.  A&Ox4 tender on forehead left and neck tenderness  Patiebnt did hit head but no LOC.  Complains of neck pain, left hip pain, back pain and head pain.  EMS gave fentanyl 27mg  ?

## 2021-07-14 NOTE — Plan of Care (Signed)

## 2021-07-15 ENCOUNTER — Encounter (HOSPITAL_COMMUNITY): Payer: Self-pay | Admitting: Student

## 2021-07-15 DIAGNOSIS — Z993 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: Secondary | ICD-10-CM | POA: Diagnosis not present

## 2021-07-15 DIAGNOSIS — R531 Weakness: Secondary | ICD-10-CM

## 2021-07-15 DIAGNOSIS — K2100 Gastro-esophageal reflux disease with esophagitis, without bleeding: Secondary | ICD-10-CM

## 2021-07-15 DIAGNOSIS — R262 Difficulty in walking, not elsewhere classified: Secondary | ICD-10-CM | POA: Diagnosis not present

## 2021-07-15 DIAGNOSIS — E43 Unspecified severe protein-calorie malnutrition: Secondary | ICD-10-CM | POA: Insufficient documentation

## 2021-07-15 DIAGNOSIS — S72002A Fracture of unspecified part of neck of left femur, initial encounter for closed fracture: Secondary | ICD-10-CM | POA: Diagnosis not present

## 2021-07-15 DIAGNOSIS — M797 Fibromyalgia: Secondary | ICD-10-CM

## 2021-07-15 DIAGNOSIS — E538 Deficiency of other specified B group vitamins: Secondary | ICD-10-CM

## 2021-07-15 DIAGNOSIS — I10 Essential (primary) hypertension: Secondary | ICD-10-CM | POA: Diagnosis not present

## 2021-07-15 LAB — BASIC METABOLIC PANEL WITH GFR
Anion gap: 9 (ref 5–15)
BUN: 14 mg/dL (ref 8–23)
CO2: 23 mmol/L (ref 22–32)
Calcium: 8.5 mg/dL — ABNORMAL LOW (ref 8.9–10.3)
Chloride: 99 mmol/L (ref 98–111)
Creatinine, Ser: 1.04 mg/dL — ABNORMAL HIGH (ref 0.44–1.00)
GFR, Estimated: 53 mL/min — ABNORMAL LOW
Glucose, Bld: 156 mg/dL — ABNORMAL HIGH (ref 70–99)
Potassium: 4.5 mmol/L (ref 3.5–5.1)
Sodium: 131 mmol/L — ABNORMAL LOW (ref 135–145)

## 2021-07-15 LAB — VITAMIN D 25 HYDROXY (VIT D DEFICIENCY, FRACTURES): Vit D, 25-Hydroxy: 65.02 ng/mL (ref 30–100)

## 2021-07-15 LAB — CBC
HCT: 32.8 % — ABNORMAL LOW (ref 36.0–46.0)
Hemoglobin: 10.4 g/dL — ABNORMAL LOW (ref 12.0–15.0)
MCH: 30.3 pg (ref 26.0–34.0)
MCHC: 31.7 g/dL (ref 30.0–36.0)
MCV: 95.6 fL (ref 80.0–100.0)
Platelets: 232 K/uL (ref 150–400)
RBC: 3.43 MIL/uL — ABNORMAL LOW (ref 3.87–5.11)
RDW: 15.3 % (ref 11.5–15.5)
WBC: 13.1 K/uL — ABNORMAL HIGH (ref 4.0–10.5)
nRBC: 0 % (ref 0.0–0.2)

## 2021-07-15 MED ORDER — POLYVINYL ALCOHOL 1.4 % OP SOLN
1.0000 [drp] | Freq: Three times a day (TID) | OPHTHALMIC | Status: DC | PRN
  Administered 2021-07-15: 1 [drp] via OPHTHALMIC
  Filled 2021-07-15: qty 15

## 2021-07-15 MED ORDER — PROPYLENE GLYCOL 0.6 % OP SOLN: OPHTHALMIC | Status: DC

## 2021-07-15 MED ORDER — APIXABAN 2.5 MG PO TABS
2.5000 mg | ORAL_TABLET | Freq: Two times a day (BID) | ORAL | Status: DC
  Administered 2021-07-15 – 2021-07-24 (×19): 2.5 mg via ORAL
  Filled 2021-07-15 (×19): qty 1

## 2021-07-15 MED ORDER — ATORVASTATIN CALCIUM 10 MG PO TABS
20.0000 mg | ORAL_TABLET | Freq: Every day | ORAL | Status: DC
  Administered 2021-07-15 – 2021-07-27 (×13): 20 mg via ORAL
  Filled 2021-07-15 (×13): qty 2

## 2021-07-15 MED ORDER — ALBUTEROL SULFATE (2.5 MG/3ML) 0.083% IN NEBU: 3.0000 mL | INHALATION_SOLUTION | Freq: Four times a day (QID) | RESPIRATORY_TRACT | Status: DC | PRN

## 2021-07-15 MED ORDER — ACYCLOVIR 200 MG PO CAPS
200.0000 mg | ORAL_CAPSULE | Freq: Two times a day (BID) | ORAL | Status: DC
  Administered 2021-07-15 – 2021-07-27 (×25): 200 mg via ORAL
  Filled 2021-07-15 (×26): qty 1

## 2021-07-15 MED ORDER — NORTRIPTYLINE HCL 25 MG PO CAPS
50.0000 mg | ORAL_CAPSULE | Freq: Every day | ORAL | Status: DC
  Administered 2021-07-15 – 2021-07-26 (×12): 50 mg via ORAL
  Filled 2021-07-15 (×13): qty 2

## 2021-07-15 MED ORDER — ENSURE ENLIVE PO LIQD
237.0000 mL | Freq: Two times a day (BID) | ORAL | Status: DC
  Administered 2021-07-15 – 2021-07-27 (×13): 237 mL via ORAL

## 2021-07-15 MED ORDER — ADULT MULTIVITAMIN W/MINERALS CH
1.0000 | ORAL_TABLET | Freq: Every day | ORAL | Status: DC
  Administered 2021-07-15 – 2021-07-27 (×13): 1 via ORAL
  Filled 2021-07-15 (×13): qty 1

## 2021-07-15 MED ORDER — OXYCODONE HCL 5 MG PO TABS
5.0000 mg | ORAL_TABLET | ORAL | Status: DC | PRN
  Administered 2021-07-15 – 2021-07-24 (×24): 5 mg via ORAL
  Filled 2021-07-15 (×25): qty 1

## 2021-07-15 NOTE — TOC CAGE-AID Note (Signed)
Transition of Care (TOC) - CAGE-AID Screening ? ? ?Patient Details  ?Name: Christina Fry ?MRN: [id number] ?Date of Birth: April 20, 1940 ? ?Transition of Care (TOC) CM/SW Contact:    ?Odes Lolli C Tarpley-Carter, LCSWA ?Phone Number: ?07/15/2021, 11:48 AM ? ? ?Clinical Narrative: ?Pt is unable to participate in Cage Aid. ?CSW will assess at a better time. ? ?Passenger transport manager, MSW, LCSW-A ?Pronouns:  She/Her/Hers ?[facility] HealthTransitions of Care ?Clinical Social Worker ?Direct Number:  [phone] ?[email or web address] ? ?CAGE-AID Screening: ?Substance Abuse Screening unable to be completed due to: : Patient unable to participate ? ?  ?  ?  ?  ?  ? ?  ? ?  ? ? ? ? ? ? ?

## 2021-07-15 NOTE — Op Note (Signed)
Orthopaedic Surgery Operative Note (CSN: [id number] ) ?Date of Surgery: 07/14/2021  ?Admit Date: 07/14/2021  ? ?Diagnoses: ?Pre-Op Diagnoses: ?Left intertrochanteric femur fracture ? ?Post-Op Diagnosis: ?Same ? ?Procedures: ?CPT 27245-Cephalomedullary nailing of left intertrochanteric femur fracture ? ?Surgeons : ?Primary: Shona Needles, MD ? ?Assistant: Patrecia Pace, PA-C ? ?Location: OR 7  ? ?Anesthesia:General  ? ?Antibiotics: Ancef 2g preop  ? ?Tourniquet time: None   ? ?Estimated Blood Loss: 50 mL ? ?Complications:None ? ?Specimens:None  ? ?Implants: ?Implant Name Type Inv. Item Serial No. Manufacturer Lot No. LRB No. Used Action  ?SCREW LAG COMPR KIT 90/85 - [id number] Screw SCREW LAG COMPR KIT 90/85  [facility] [employer] [id number] Left 1 Implanted  ?NAIL INTERTAN 10X18 130D 10S - [id number] Nail NAIL INTERTAN 10X18 130D 10S  [employer] [id number] Left 1 Implanted  ?SCREW TRIGEN LOW PROF 5.0X32.5 - [id number] Screw SCREW TRIGEN LOW PROF 5.0X32.5  [employer] [id number] Left 1 Implanted  ?  ? ?Indications for Surgery: ?81 year old female who had a ground-level fall with a left intertrochanteric femur fracture.  Due to the unstable nature of her injury I recommend proceeding with cephalomedullary nailing.  Risks and benefits were discussed with the patient and her daughter.  Risks included but not limited to bleeding, infection, malunion, nonunion, hardware failure, hardware irritation, nerve and blood vessel injury, DVT, even the possible anesthetic complications.  She agreed to proceed with surgery and consent was obtained. ? ?Operative Findings: ?Cephalomedullary nailing of left intertrochanteric femur fracture using [employer] InterTAN 10 x 180 mm nail with a 90 mm / 85 mm lag screw combo ? ?Procedure: ?The patient was identified in the preoperative holding area. Consent was confirmed with the patient and their family and all questions were answered. The operative  extremity was marked after confirmation with the patient. she was then brought back to the operating room by our anesthesia colleagues.  She was placed under general anesthetic and carefully transferred over to a Hana table.  All bony prominences were well-padded.  Traction was applied to the lower extremity and fluoroscopic imaging showed adequate alignment.  The left lower extremity was then prepped and draped in usual sterile fashion.  A timeout was performed to verify the patient, the procedure, and the extremity.  Preoperative antibiotics were dosed. ? ?Small incision proximal to the greater trochanter was made and carried down through skin and subcutaneous tissue.  Threaded guidewire was directed at the tip of the greater trochanter and advanced into the proximal metaphysis.  Positioning was confirmed with fluoroscopy.  I then used an entry reamer to enter the medullary canal.  I then passed a 10 x 180 mm InterTAN down the center of the canal attached to the targeting arm.  I then used the targeting arm to make a percutaneous incision and directed a threaded guidewire up into the head/neck segment.  I confirmed adequate tip apex distance and measured the length.  I decided to place a 90 mm screw.  I then drilled the path for the compression screw and placed an antirotation bar.  I then placed the lag screw and then placed the compression screw and compressed approximately 5 mm.  The proximal portion of the nail was statically locked. ? ?I used the targeting arm to place a distal interlocking screw.  The targeting arm was removed.  Final fluoroscopic imaging was obtained.  The incision was copiously irrigated.  A layered closure of 2-0 Vicryl and 3-0 Monocryl with Dermabond  was used to close the skin.  Sterile dressings were applied.  The patient was awoken from anesthesia and taken to the PACU in stable condition. ? ?Post Op Plan/Instructions: ?The patient will be weightbearing as tolerated to the left lower  extremity.  She will receive postoperative Ancef.  She may be restarted on her anticoagulation tomorrow if her hemoglobin is stable.  We will have her mobilize with physical and Occupational Therapy. ? ?I was present and performed the entire surgery. ? ?Patrecia Pace, PA-C did assist me throughout the case. An assistant was necessary given the difficulty in approach, maintenance of reduction and ability to instrument the fracture. ? ? ?Katha Hamming, MD ?[facility]  ?

## 2021-07-15 NOTE — Progress Notes (Signed)
Orthopaedic Trauma Progress Note ? ?SUBJECTIVE: Doing okay this morning, notes a lot of pain particularly in the left leg.  Patient on oxycodone 5 mg about 4 times a day at baseline.  I have increased her oxycodone 5 mg here to every 4 hours as needed to assist with pain control.  We discussed discontinuing the tramadol as she does not feel this is helpful.  She is currently asking when she can receive her next dose of pain medication.  I have written out a list of patient's available medications and how often she can receive these as well as when she last received these so she is able to keep track of this throughout the day.  She denies any chest pain or shortness of breath.  Has been tolerating diet and fluids. No other complaints currently.  Was unable to work with therapies this morning due to significant pain.  Patient does not ambulate at baseline.  She stands and transfers to her wheelchair.  Her daughter should be bringing her wheelchair up to the hospital later today. ? ?OBJECTIVE:  ?Vitals:  ? 07/15/21 0447 07/15/21 0735  ?BP: (!) 142/62 139/68  ?Pulse: 95 (!) 101  ?Resp: 12 17  ?Temp: 98.3 ?F (36.8 ?C) 97.8 ?F (36.6 ?C)  ?SpO2: 96% 97%  ? ? ?General: Laying in bed, no acute distress ?Respiratory: No increased work of breathing.  ?Left lower extremity: Dressings are clean, dry, intact.  Has tenderness with palpation throughout the thigh and over the hip as expected.  Tolerates minimal hip or knee motion currently while in bed secondary to pain.  Tolerates passive ankle dorsiflexion and plantarflexion.  Patient notes at baseline she does not have much active control over these motions.  Wiggles toes on her own.  Endorses sensation throughout extremity.  2+ DP pulse ? ?IMAGING: Stable post op imaging.  ? ?LABS:  ?Results for orders placed or performed during the hospital encounter of 07/14/21 (from the past 24 hour(s))  ?I-Stat Chem 8, ED     Status: Abnormal  ? Collection Time: 07/14/21 10:04 AM  ?Result  Value Ref Range  ? Sodium 139 135 - 145 mmol/L  ? Potassium 4.6 3.5 - 5.1 mmol/L  ? Chloride 103 98 - 111 mmol/L  ? BUN 15 8 - 23 mg/dL  ? Creatinine, Ser 1.00 0.44 - 1.00 mg/dL  ? Glucose, Bld 129 (H) 70 - 99 mg/dL  ? Calcium, Ion 1.15 1.15 - 1.40 mmol/L  ? TCO2 28 22 - 32 mmol/L  ? Hemoglobin 14.6 12.0 - 15.0 g/dL  ? HCT 43.0 36.0 - 46.0 %  ?Comprehensive metabolic panel     Status: Abnormal  ? Collection Time: 07/14/21 10:05 AM  ?Result Value Ref Range  ? Sodium 140 135 - 145 mmol/L  ? Potassium 4.1 3.5 - 5.1 mmol/L  ? Chloride 104 98 - 111 mmol/L  ? CO2 27 22 - 32 mmol/L  ? Glucose, Bld 132 (H) 70 - 99 mg/dL  ? BUN 11 8 - 23 mg/dL  ? Creatinine, Ser 1.05 (H) 0.44 - 1.00 mg/dL  ? Calcium 9.6 8.9 - 10.3 mg/dL  ? Total Protein 6.7 6.5 - 8.1 g/dL  ? Albumin 3.4 (L) 3.5 - 5.0 g/dL  ? AST 26 15 - 41 U/L  ? ALT 18 0 - 44 U/L  ? Alkaline Phosphatase 93 38 - 126 U/L  ? Total Bilirubin 0.5 0.3 - 1.2 mg/dL  ? GFR, Estimated 53 (L) >60 mL/min  ? Anion gap  9 5 - 15  ?CBC     Status: Abnormal  ? Collection Time: 07/14/21 10:05 AM  ?Result Value Ref Range  ? WBC 18.2 (H) 4.0 - 10.5 K/uL  ? RBC 4.27 3.87 - 5.11 MIL/uL  ? Hemoglobin 12.9 12.0 - 15.0 g/dL  ? HCT 41.6 36.0 - 46.0 %  ? MCV 97.4 80.0 - 100.0 fL  ? MCH 30.2 26.0 - 34.0 pg  ? MCHC 31.0 30.0 - 36.0 g/dL  ? RDW 15.4 11.5 - 15.5 %  ? Platelets 273 150 - 400 K/uL  ? nRBC 0.0 0.0 - 0.2 %  ?Ethanol     Status: None  ? Collection Time: 07/14/21 10:05 AM  ?Result Value Ref Range  ? Alcohol, Ethyl (B) <10 <10 mg/dL  ?Lactic acid, plasma     Status: Abnormal  ? Collection Time: 07/14/21 10:05 AM  ?Result Value Ref Range  ? Lactic Acid, Venous 2.3 (HH) 0.5 - 1.9 mmol/L  ?Protime-INR     Status: None  ? Collection Time: 07/14/21 10:05 AM  ?Result Value Ref Range  ? Prothrombin Time 12.6 11.4 - 15.2 seconds  ? INR 1.0 0.8 - 1.2  ?CK     Status: None  ? Collection Time: 07/14/21 10:05 AM  ?Result Value Ref Range  ? Total CK 84 38 - 234 U/L  ?Glucose, capillary     Status: None  ?  Collection Time: 07/14/21 12:32 PM  ?Result Value Ref Range  ? Glucose-Capillary 97 70 - 99 mg/dL  ?VITAMIN D 25 Hydroxy (Vit-D Deficiency, Fractures)     Status: None  ? Collection Time: 07/15/21  2:13 AM  ?Result Value Ref Range  ? Vit D, 25-Hydroxy 65.02 30 - 100 ng/mL  ?CBC     Status: Abnormal  ? Collection Time: 07/15/21  2:13 AM  ?Result Value Ref Range  ? WBC 13.1 (H) 4.0 - 10.5 K/uL  ? RBC 3.43 (L) 3.87 - 5.11 MIL/uL  ? Hemoglobin 10.4 (L) 12.0 - 15.0 g/dL  ? HCT 32.8 (L) 36.0 - 46.0 %  ? MCV 95.6 80.0 - 100.0 fL  ? MCH 30.3 26.0 - 34.0 pg  ? MCHC 31.7 30.0 - 36.0 g/dL  ? RDW 15.3 11.5 - 15.5 %  ? Platelets 232 150 - 400 K/uL  ? nRBC 0.0 0.0 - 0.2 %  ?Basic metabolic panel     Status: Abnormal  ? Collection Time: 07/15/21  2:13 AM  ?Result Value Ref Range  ? Sodium 131 (L) 135 - 145 mmol/L  ? Potassium 4.5 3.5 - 5.1 mmol/L  ? Chloride 99 98 - 111 mmol/L  ? CO2 23 22 - 32 mmol/L  ? Glucose, Bld 156 (H) 70 - 99 mg/dL  ? BUN 14 8 - 23 mg/dL  ? Creatinine, Ser 1.04 (H) 0.44 - 1.00 mg/dL  ? Calcium 8.5 (L) 8.9 - 10.3 mg/dL  ? GFR, Estimated 53 (L) >60 mL/min  ? Anion gap 9 5 - 15  ? ? ?ASSESSMENT: Christina Fry is a 81 y.o. female, 1 Day Post-Op s/p ?CEPHALOMEDULLARY NAILING LEFT INTERTROCHANTERIC FEMUR FRACTURE ? ?CV/Blood loss: Acute blood loss anemia, Hgb 10.4 this morning. Hemodynamically stable ? ?PLAN: ?Weightbearing: WBAT LLE ?ROM: Okay for hip and knee range of motion as tolerated ?Incisional and dressing care: OK to remove dressings 07/16/2021 and leave open to air with dry gauze PRN  ?Showering: Okay to begin showering with assistance 07/17/2021.  Incisions may get wet at this time ?Orthopedic device(s):  Walker, wheelchair   ?Pain management:  ?1. Tylenol 650 mg q 6 hours scheduled ?2. Robaxin 500 mg q 6 hours PRN ?3. Oxycodone 5 mg q 4 hours PRN ?4. Dilaudid 0.5 mg q 2 hours PRN ?5. Gabapentin 200 mg twice daily ?VTE prophylaxis:  Restart Eliquis today , SCDs ?ID:  Ancef 2gm post op ?Foley/Lines:  No  foley, KVO IVFs ?Impediments to Fracture Healing: Vitamin D level 65, no additional supplementation indicated ?Dispo: PT/OT evaluation today, dispo pending. Continue working on pain control today.  Plan to remove dressings from LLE 07/16/2021  ? ?D/C recommendations: ?-Oxycodone for pain control ?-Continue home dose Eliquis for DVT prophylaxis ?-No additional Vit D supplementation needed ? ?Follow - up plan: 2 weeks after discharge for wound check and repeat x-rays ? ? ?Contact information:  Katha Hamming MD, Rushie Nyhan PA-C. After hours and holidays please check [email or web address] for group call information for [facility] ? ? ?Gwinda Passe, PA-C ?[phone] (office) ?[email or web address] ? ? ? ?

## 2021-07-15 NOTE — Plan of Care (Signed)

## 2021-07-15 NOTE — Progress Notes (Addendum)
OT Cancellation Note ? ?Patient Details ?Name: Christina Fry ?MRN: [id number] ?DOB: April 19, 1940 ? ? ?Cancelled Treatment:    Reason Eval/Treat Not Completed: Pain limiting ability to participate pt reports significant pain getting to/from [facility] this morning, does not care to get up again right now. Will follow up again for OT eval as schedule permits. ? ?Layla Maw ?07/15/2021, 8:58 AM ?

## 2021-07-15 NOTE — Progress Notes (Addendum)
Initial Nutrition Assessment ? ?DOCUMENTATION CODES:  ? ?Severe malnutrition in context of acute illness/injury ? ?INTERVENTION:  ? ?Ensure Enlive po BID, each supplement provides 350 kcal and 20 grams of protein. ? ?MVI with minerals daily. ? ?NUTRITION DIAGNOSIS:  ? ?Severe Malnutrition related to acute illness (recent back surgery) as evidenced by moderate muscle depletion, moderate fat depletion, severe muscle depletion. ? ?GOAL:  ? ?Patient will meet greater than or equal to 90% of their needs ? ?MONITOR:  ? ?PO intake, Supplement acceptance ? ?REASON FOR ASSESSMENT:  ? ?Consult ?Hip fracture protocol ? ?ASSESSMENT:  ? ?81 yo female admitted with L hip fracture s/p fall. PMH includes polymyalgia rheumatica, DM, asthma, Grave's disease, fibromyalgia, GERD, HLD. ? ?S/P surgical repair of L femur fracture on 5/3. ? ?Patient states that she has lost ~9 lbs, has noticed her clothes fitting looser, and is now wearing a size smaller than usual. She had back surgery in February and has been eating less since then because nothing looks appetizing. She doesn't walk much, uses a wheelchair to get around. She likes to eat a small breakfast, then chooses lunch or dinner provided by the independent living facility. She likes Ensure supplements, drinks them everyday at her facility. Reviewed menu with patient. Will change diet order to room service with assistance to ensure she receives a meal even if she forgets to order.  ? ?Weight history reviewed. Weight stable since February 2023.  ? ?Currently on a regular diet; meal intakes: 70% of breakfast today. ? ?Labs reviewed. Na 131 ? ?Medications reviewed and include cholecalciferol, Colace. ?  ?Patient meets criteria for severe malnutrition in the context of acute illness with moderate depletion of muscle and subcutaneous fat mass.  ? ?NUTRITION - FOCUSED PHYSICAL EXAM: ? ?Flowsheet Row Most Recent Value  ?Orbital Region Mild depletion  ?Upper Arm Region No depletion   ?Thoracic and Lumbar Region Moderate depletion  ?Buccal Region Moderate depletion  ?Temple Region Moderate depletion  ?Clavicle Bone Region Moderate depletion  ?Clavicle and Acromion Bone Region Mild depletion  ?Scapular Bone Region Mild depletion  ?Dorsal Hand Severe depletion  ?Patellar Region Mild depletion  ?Anterior Thigh Region Mild depletion  ?Posterior Calf Region Mild depletion  ?Edema (RD Assessment) Mild  [LLE]  ?Hair Reviewed  ?Eyes Reviewed  ?Mouth Reviewed  ?Skin Reviewed  ?Nails Reviewed  ? ?  ? ? ?Diet Order:   ?Diet Order   ? ?       ?  Diet regular Room service appropriate? Yes with Assist; Fluid consistency: Thin  Diet effective now       ?  ? ?  ?  ? ?  ? ? ?EDUCATION NEEDS:  ? ?Education needs have been addressed ? ?Skin:  Skin Assessment: Reviewed RN Assessment ? ?Last BM:  no BM documented ? ?Height:  ? ?Ht Readings from Last 1 Encounters:  ?07/14/21 '5\' 3"'$  (1.6 m)  ? ? ?Weight:  ? ?Wt Readings from Last 1 Encounters:  ?07/14/21 79.4 kg  ? ? ? ?BMI:  Body mass index is 31.01 kg/m?. ? ?Estimated Nutritional Needs:  ? ?Kcal:  1800-2000 ? ?Protein:  90-100 gm ? ?Fluid:  1.8-2 L ? ? ? ?Lucas Mallow RD, LDN, CNSC ?Please refer to Amion for contact information.                                                       ? ?

## 2021-07-15 NOTE — Progress Notes (Signed)
OT Cancellation Note ? ?Patient Details ?Name: Christina Fry ?MRN: [id number] ?DOB: May 28, 1940 ? ? ?Cancelled Treatment:    Reason Eval/Treat Not Completed: Patient declined, no reason specified Re-attempted OT eval at 11:30AM with pt recently receiving dilaudid. However, pt refused and was hoping to "get some sleep" after getting pain meds. Will follow-up tomorrow for OT eval. ? ?Layla Maw ?07/15/2021, 12:04 PM ?

## 2021-07-15 NOTE — TOC Initial Note (Signed)
Transition of Care (TOC) - Initial/Assessment Note  ? ? ?Patient Details  ?Name: Christina Fry ?MRN: [id number] ?Date of Birth: Feb 03, 1941 ? ?Transition of Care [facility]) CM/SW Contact:    ?Sharin Mons, RN ?Phone Number: ?07/15/2021, 3:17 PM ? ?Clinical Narrative:             ?   - S/p IMN, L femoral  5/3 ?NCM spoke with pt and daughter  Manuela Schwartz regarding d/c planning. Pt states daughter to make decisions about her TOC needs. Daughter states pt lives @ [place]. States pt has been W/C bound x 1 year. States pt will probably need ST SNF Rehab @ d/c and ok to use Medicare Benefits , not [facility].  States preference for SNF: [facility] [place]. ? ?Transfer Coordinator April made aware by NCM pt's current hospital stay. Pt is affiliated [facility]. [place], [phone], ext. [phone], [place] [id number], [place], [id number]. ? ?PT/OT evaluations pending..... ? ?TOC team following and will assist with needs. ? ?Expected Discharge Plan: [place] (SNF) ?Barriers to Discharge: Continued Medical Work up ? ? ?Patient Goals and CMS Choice ?  ?  ?  ? ?Expected Discharge Plan and Services ?Expected Discharge Plan: [place] (SNF) ?  ?Discharge Planning Services: CM Consult ?  ?Living arrangements for the past 2 months: Apartment ?                ?  ?  ?  ?  ?  ?  ?  ?  ?  ?  ? ?Prior Living Arrangements/Services ?Living arrangements for the past 2 months: Apartment ?Lives with:: Self ?Patient language and need for interpreter reviewed:: Yes ?Do you feel safe going back to the place where you live?: Yes      ?Need for Family Participation in Patient Care: Yes (Comment) ?Care giver support system in place?: Yes (comment) ?  ?Criminal Activity/Legal Involvement Pertinent to Current Situation/Hospitalization: No - Comment as needed ? ?Activities of Daily Living ?Home Assistive Devices/Equipment: Wheelchair, Eyeglasses ?ADL Screening (condition at time of admission) ?Patient's  cognitive ability adequate to safely complete daily activities?: Yes ?Is the patient deaf or have difficulty hearing?: No ?Does the patient have difficulty seeing, even when wearing glasses/contacts?: No ?Does the patient have difficulty concentrating, remembering, or making decisions?: No ?Patient able to express need for assistance with ADLs?: Yes ?Does the patient have difficulty dressing or bathing?: Yes ?Independently performs ADLs?: No ?Does the patient have difficulty walking or climbing stairs?: Yes ?Weakness of Legs: Left ?Weakness of Arms/Hands: None ? ?Permission Sought/Granted ?  ?Permission granted to share information with : Yes, Verbal Permission Granted ? Share Information with NAME: [name] (Daughter) [phone] ?   ?   ?   ? ?Emotional Assessment ?  ?  ?  ?Orientation: : Oriented to Self, Oriented to Place, Oriented to  Time, Oriented to Situation ?Alcohol / Substance Use: Not Applicable ?Psych Involvement: No (comment) ? ?Admission diagnosis:  Closed left hip fracture ([place]) [S72.002A] ?Fall, initial encounter [W19.XXXA] ?Closed intertrochanteric fracture of hip, left, initial encounter ([place]) [S72.142A] ?Patient Active Problem List  ? Diagnosis Date Noted  ? Protein-calorie malnutrition, severe 07/15/2021  ? Closed left hip fracture ([place]) 07/14/2021  ? Constipation 05/09/2021  ? T12 compression fracture ([place]) 05/05/2021  ? 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 Hyponatremia 05/05/2021  ? History of pulmonary embolus (PE) 05/05/2021  ? Anemia 05/05/2021  ? Vitamin B12 deficiency 05/05/2021  ?  Iron deficiency anemia 05/05/2021  ? Acute conjunctivitis of right eye 05/05/2021  ? Back pain 04/28/2021  ? Hypokalemia 04/28/2021  ? Hypocalcemia 04/28/2021  ? Hypomagnesemia 04/28/2021  ? Mild intermittent asthma, uncomplicated 15/17/6160  ? Near syncope 08/26/2020  ? Pulmonary embolism ([place]) 08/26/2020  ? Weakness of both lower extremities 11/29/2019  ? Generalized weakness 11/23/2019  ? PMR  (polymyalgia rheumatica) (HCC) 11/23/2019  ? GERD (gastroesophageal reflux disease) 11/23/2019  ? Fibromyalgia 11/23/2019  ? Essential hypertension 11/23/2019  ? Hypothyroidism 11/23/2019  ? Ambulatory dysfunction 11/23/2019  ? Accident due to mechanical fall without injury 11/23/2019  ? TIA (transient ischemic attack) 11/23/2019  ? Seasonal and perennial allergic rhinitis 04/04/2018  ? Mild intermittent asthma without complication 73/71/0626  ? ?PCP:  Ferdie Ping, MD ?Pharmacy:   ?[facility], [place] ?[address] ?[place] [address] ?Phone: [phone] Fax: [phone] ? ? ? ? ?Social Determinants of Health (SDOH) Interventions ?  ? ?Readmission Risk Interventions ? ?  08/28/2020  ?  1:48 PM 12/06/2019  ?  2:10 PM  ?Readmission Risk Prevention Plan  ?Transportation Screening Complete Complete  ?[place] or Home Care Consult Complete   ?Social Work Consult for [place] Planning/Counseling Complete Complete  ?Palliative Care Screening Not Applicable   ?Medication Review Press photographer) Complete Complete  ? ? ? ?

## 2021-07-15 NOTE — Hospital Course (Addendum)
HPI per Dr. Phillips Climes on 07/14/21 ? Christina Fry  is a 81 y.o. female, with past medical history of polymyalgia rheumatica, diabetes, asthma, Graves' disease, prior PE on Eliquis, patient presents to ED secondary to fall, patient report fall happened last time, she is mostly wheelchair bound, transfers between chair/bed and wheelchair at her independent living facility, reports last night she was trying to go from wheelchair to chair, she fell back striking her head into the hard floor, as well as her left leg, she was unable to stand up, or to call for help, and laid on the floor from 8 last night until this morning when she was found by staff, and reports severe headache from where she hit her head on the floor, neck pain, left hip pain, left knee pain, she was given fentanyl by EMS, she denies any focal deficits, no fever, no chills,. ?-In ED no significant lab abnormalities, CT head with no evidence of acute findings, findings significant for left hip fracture, ED discussed with orthopedic Dr. Doreatha Martin, who reports plan for surgery today, last dose of Eliquis was yesterday morning. ? ?**Interim History ?Underwent surgical intervention and is doing well postoperatively.  WBC is slightly went up but trended back down and is now stable and she does have an acute blood loss anemia as expected but Hgb/Hct is now improved today.  Need to continue monitor for signs of the bleeding.  PT OT recommending SNF and from a medical standpoint she is stable to D/C to SNF with continued Therapy but [facility] ([facility] but [facility]).  ?

## 2021-07-15 NOTE — Progress Notes (Signed)
PT Cancellation Note ? ?Patient Details ?Name: Christina Fry ?MRN: [id number] ?DOB: Jun 08, 1940 ? ? ?Cancelled Treatment:    Reason Eval/Treat Not Completed: Pain limiting ability to participate; pt reports significant pain getting to/from [facility] this morning, does not care to get up again right now. Pt hopeful daughter can bring her w/c for transfer practice. Will follow-up for PT Evaluation as schedule permits. ? ?Mabeline Caras, PT, DPT ?Acute Rehabilitation Services  ?Pager [phone] ?Office [phone] ? ?Derry Lory ?07/15/2021, 8:24 AM ?

## 2021-07-15 NOTE — Progress Notes (Signed)
?PROGRESS NOTE ? ? ? ?Christina Fry  YTK:[id number] DOB: [birth year] DOA: 07/14/2021 ?PCP: Ferdie Ping, MD  ? ?Brief Narrative:  ?HPI per Dr. Phillips Climes on 07/14/21 ? Christina Fry  is a 81 y.o. female, with past medical history of polymyalgia rheumatica, diabetes, asthma, Graves' disease, prior PE on Eliquis, patient presents to ED secondary to fall, patient report fall happened last time, she is mostly wheelchair bound, transfers between chair/bed and wheelchair at her independent living facility, reports last night she was trying to go from wheelchair to chair, she fell back striking her head into the hard floor, as well as her left leg, she was unable to stand up, or to call for help, and laid on the floor from 8 last night until this morning when she was found by staff, and reports severe headache from where she hit her head on the floor, neck pain, left hip pain, left knee pain, she was given fentanyl by EMS, she denies any focal deficits, no fever, no chills,. ?-In ED no significant lab abnormalities, CT head with no evidence of acute findings, findings significant for left hip fracture, ED discussed with orthopedic Dr. Doreatha Martin, who reports plan for surgery today, last dose of Eliquis was yesterday morning. ? ?**Interim History ?  ? ? ?Assessment and Plan: ? ?Left Hip Fracture status post cephalomedullary nailing of the left intertrochanteric femur fracture postoperative day 1 ?-Due to mechanical fall, orthopedic input greatly appreciated, plan for surgical repair today, she is admitted under hip fracture pathway. ?-Was npo for Surgery and now on a Diet  ?-Eliquis now resumed ?-We will consult PT/OT, and TOC as likely will need SNF or rehab placement ?-Patient and daughter understand risks of surgery, she is mild to moderate risk, and they are agreeable to proceed ?-Patient reports she is DNR, but she understands it will be temporary suspended and she will be full code during procedure . ?-Continue with Pain  Control as delineated per Ortho Recc's ?-Surgery recommends weightbearing as tolerated on left lower extremity and recommending okay for hip and knee range of motion as tolerated.  They are recommending to remove dressings on 5 5 and leave open to air with dry gauze as needed and okay to begin showering on 07/17/2021. ?-PT and OT to evaluate and treat and this is pending ?-Orthopedic surgery recommending oxycodone for as needed pain control at discharge and following up 2 weeks after discharge for wound check and repeat x-rays ?  ?Hyperlipidemia ?-Continue with Atorvastatin 20 mg po daily  ? ?ABLA as a cause of Post-Operative Anemia ?-Patient's Hgb/Hct went from 14.6/43.0 -> 12.9/41.6 -> 10.4/32.8 ?-Could have had a Dilutional Component as well ?-Check Anemia Panel in the AM  ?-Continue to Monitor for S/Sx of Bleeding; No Overt Bleeding noted ?-Repeat CBC in the AM  ?  ?History of Pulmonary Embolism ?-Per Orthopedic Surgery ok to resume Apixaban and now back on 2.5 mg BID  ?  ?Ambulatory dysfunction/dependent on wheelchair ?-We will consult PT/OT, likely will need SNF placement ? ?Hyponatremia ?-Patient's sodium went from 140 and dropped to 131 ?-We have discontinued her LR now ?-Continue monitor and trend and repeat CMP in a.m. ? ?Leukocytosis ?-Likely reactive in the setting of Fall and Surgery and now improving ?-WBC went from 18.2 -> 13.1 ?-Continue to Monitor and Trend ?-Repeat CBC in the AM  ?  ?Hypothyroidism ?-Continue with Levothyroxine 100 mcg po Daily  ?  ?Hypertension ?-Continue with home medications, will continue with Metoprolol Succinate 50  mg po Daily  ?-Continue to Monitor BP per Protocol ?-Last BP reading was  ?  ?GERD/GI Prophylaxis ?-Continue with PPI with Pantoprazole 40 mg po Daily  ? ?Obesity ?-Complicates overall prognosis and care ?-Estimated body mass index is 31.01 kg/m? as calculated from the following: ?  Height as of this encounter: '5\' 3"'$  (1.6 m). ?  Weight as of this encounter: 79.4 kg.   ?-Weight Loss and Dietary Counseling given ? ?DVT prophylaxis: apixaban (ELIQUIS) tablet 2.5 mg Start: 07/15/21 1000 ?SCDs Start: 07/14/21 1553 ?apixaban (ELIQUIS) tablet 2.5 mg  ?  Code Status: DNR ?Family Communication:  ? ?Disposition Plan:  ?Level of care: Med-Surg ?Status is: Inpatient ?Remains inpatient appropriate because: No family present at bedside  ?  ?Consultants:  ?Orthopedic Surgery ? ?Procedures:  ?CEPHALOMEDULLARY NAILING LEFT INTERTROCHANTERIC FEMUR FRACTURE ? ?Antimicrobials:  ?Anti-infectives (From admission, onward)  ? ? Start     Dose/Rate Route Frequency Ordered Stop  ? 07/14/21 2000  ceFAZolin (ANCEF) IVPB 1 g/50 mL premix       ? 1 g ?100 mL/hr over 30 Minutes Intravenous Every 8 hours 07/14/21 1552 07/15/21 2159  ? 07/14/21 1200  ceFAZolin (ANCEF) IVPB 2g/100 mL premix       ? 2 g ?200 mL/hr over 30 Minutes Intravenous On call to O.R. 07/14/21 1155 07/14/21 1255  ? ?  ?  ?Subjective: ?Seen and examined at bedside and states that she is doing okay.  Had some pain this morning that was uncontrolled but the orthopedic surgery team has adjusted her pain regimen.  She denies any chest pain or shortness of breath.  Feels okay.  States that she is basically wheelchair-bound for the last few years.  No other concerns or complaints at this time. ? ?Objective: ?Vitals:  ? 07/14/21 1945 07/14/21 2330 07/15/21 0447 07/15/21 0735  ?BP: 121/73 133/90 (!) 142/62 139/68  ?Pulse: (!) 103 94 95 (!) 101  ?Resp: '16 14 12 17  '$ ?Temp: 98.8 ?F (37.1 ?C) 98.3 ?F (36.8 ?C) 98.3 ?F (36.8 ?C) 97.8 ?F (36.6 ?C)  ?TempSrc: Oral Oral Oral Oral  ?SpO2: 98% 97% 96% 97%  ?Weight:      ?Height:      ? ? ?Intake/Output Summary (Last 24 hours) at 07/15/2021 1307 ?Last data filed at 07/15/2021 0900 ?Gross per 24 hour  ?Intake 800 ml  ?Output 450 ml  ?Net 350 ml  ? ?Filed Weights  ? 07/14/21 1021 07/14/21 1232  ?Weight: 79.4 kg 79.4 kg  ? ?Examination: ?Physical Exam: ? ?Constitutional: WN/WD obese elderly Caucasian female in  NAD ?Respiratory: Diminished to auscultation bilaterally with coarse breath sounds, no wheezing, rales, rhonchi or crackles. Normal respiratory effort and patient is not tachypenic. No accessory muscle use. Unlabored breathing  ?Cardiovascular: RRR, no murmurs / rubs / gallops. S1 and S2 auscultated. Mild LE edema  ?Abdomen: Soft, non-tender, Distended 2/2 body habitus. Bowel sounds positive.  ?GU: Deferred. ?Musculoskeletal: No clubbing / cyanosis of digits/nails. No joint deformity upper and lower extremities. ?Neurologic: CN 2-12 grossly intact with no focal deficits. Romberg sign and cerebellar reflexes not assessed.  ?Psychiatric: Normal judgment and insight. Alert and oriented x 3. Normal mood and appropriate affect.  ? ?Data Reviewed: I have personally reviewed following labs and imaging studies ? ?CBC: ?Recent Labs  ?Lab 07/14/21 ?1004 07/14/21 ?1005 07/15/21 ?0213  ?WBC  --  18.2* 13.1*  ?HGB 14.6 12.9 10.4*  ?HCT 43.0 41.6 32.8*  ?MCV  --  97.4 95.6  ?PLT  --  273 232  ? ?Basic Metabolic Panel: ?Recent Labs  ?Lab 07/14/21 ?1004 07/14/21 ?1005 07/15/21 ?0213  ?NA 139 140 131*  ?K 4.6 4.1 4.5  ?CL 103 104 99  ?CO2  --  27 23  ?GLUCOSE 129* 132* 156*  ?BUN '15 11 14  '$ ?CREATININE 1.00 1.05* 1.04*  ?CALCIUM  --  9.6 8.5*  ? ?GFR: ?Estimated Creatinine Clearance: 40.9 mL/min (A) (by C-G formula based on SCr of 1.04 mg/dL (H)). ?Liver Function Tests: ?Recent Labs  ?Lab 07/14/21 ?1005  ?AST 26  ?ALT 18  ?ALKPHOS 93  ?BILITOT 0.5  ?PROT 6.7  ?ALBUMIN 3.4*  ? ?No results for input(s): LIPASE, AMYLASE in the last 168 hours. ?No results for input(s): AMMONIA in the last 168 hours. ?Coagulation Profile: ?Recent Labs  ?Lab 07/14/21 ?1005  ?INR 1.0  ? ?Cardiac Enzymes: ?Recent Labs  ?Lab 07/14/21 ?1005  ?CKTOTAL 84  ? ?BNP (last 3 results) ?No results for input(s): PROBNP in the last 8760 hours. ?HbA1C: ?No results for input(s): HGBA1C in the last 72 hours. ?CBG: ?Recent Labs  ?Lab 07/14/21 ?1232  ?GLUCAP 97  ? ?Lipid  Profile: ?No results for input(s): CHOL, HDL, LDLCALC, TRIG, CHOLHDL, LDLDIRECT in the last 72 hours. ?Thyroid Function Tests: ?No results for input(s): TSH, T4TOTAL, FREET4, T3FREE, THYROIDAB in the last 72 h

## 2021-07-15 NOTE — Progress Notes (Signed)
ANTICOAGULATION CONSULT NOTE - Initial Consult ? ?Pharmacy Consult for apixaban ?Indication: h/o prior PE ? ?Allergies  ?Allergen Reactions  ? Iohexol Shortness Of Breath  ?  Pt stopped breathing at [employer]. Pt refuses IV dye  ? Codeine Itching  ? Fentanyl Other (See Comments)  ?  Agitation & confusion  ? Morphine And Related Itching  ? Nitrofuran Derivatives Other (See Comments)  ?  Unknown  ? Oxycodone-Acetaminophen Itching  ? ? ?Patient Measurements: ?Height: '5\' 3"'$  (160 cm) ?Weight: 79.4 kg (175 lb 0.7 oz) ?IBW/kg (Calculated) : 52.4 ? ? ?Vital Signs: ?Temp: 97.8 ?F (36.6 ?C) (05/04 0735) ?Temp Source: Oral (05/04 0735) ?BP: 139/68 (05/04 0735) ?Pulse Rate: 101 (05/04 0735) ? ?Labs: ?Recent Labs  ?  07/14/21 ?1004 07/14/21 ?1005 07/15/21 ?0213  ?HGB 14.6 12.9 10.4*  ?HCT 43.0 41.6 32.8*  ?PLT  --  273 232  ?LABPROT  --  12.6  --   ?INR  --  1.0  --   ?CREATININE 1.00 1.05* 1.04*  ?CKTOTAL  --  84  --   ? ? ?Estimated Creatinine Clearance: 40.9 mL/min (A) (by C-G formula based on SCr of 1.04 mg/dL (H)). ? ? ?Medical History: ?Past Medical History:  ?Diagnosis Date  ? Anxiety   ? Cancer [facility])   ? Diabetes mellitus without complication ([place])   ? Fibromyalgia   ? GERD (gastroesophageal reflux disease)   ? Grave's disease   ? Herpes   ? Hyperlipidemia   ? Mild intermittent asthma without complication 5/73/2202  ? Palpitation   ? Polymyalgia rheumatica ([place])   ? ?Assessment: ?81 yo female admitted with left hip fracture s/p ortho procedure. Per Ortho fracture protocol, OK to restart home apixaban (h/o PE) on POD1 if Hgb stable at >9. Hgb today is 10.4 with no indication of transfusion given.  ? ?Goal of Therapy:  ?Prevention of VTE ?Monitor platelets by anticoagulation protocol: Yes ?  ?Plan:  ?Restart home Apixaban dose of 2.'5mg'$  PO BID ?Monitor for bleeding ?Pharmacy will sign off, but follow peripherally.  ? ?Alver Leete A. Levada Dy, PharmD, BCPS, FNKF ?Clinical Pharmacist ?[place] ?Please utilize Amion for  appropriate phone number to reach the unit pharmacist ([place]) ? ?07/15/2021,8:08 AM ? ? ?

## 2021-07-16 DIAGNOSIS — Z993 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: Secondary | ICD-10-CM | POA: Diagnosis not present

## 2021-07-16 DIAGNOSIS — S72002A Fracture of unspecified part of neck of left femur, initial encounter for closed fracture: Secondary | ICD-10-CM | POA: Diagnosis not present

## 2021-07-16 DIAGNOSIS — J4520 Mild intermittent asthma, uncomplicated: Secondary | ICD-10-CM

## 2021-07-16 DIAGNOSIS — R262 Difficulty in walking, not elsewhere classified: Secondary | ICD-10-CM | POA: Diagnosis not present

## 2021-07-16 DIAGNOSIS — M353 Polymyalgia rheumatica: Secondary | ICD-10-CM

## 2021-07-16 DIAGNOSIS — I10 Essential (primary) hypertension: Secondary | ICD-10-CM | POA: Diagnosis not present

## 2021-07-16 LAB — CBC WITH DIFFERENTIAL/PLATELET
Abs Immature Granulocytes: 0.1 10*3/uL — ABNORMAL HIGH (ref 0.00–0.07)
Basophils Absolute: 0 10*3/uL (ref 0.0–0.1)
Basophils Relative: 0 %
Eosinophils Absolute: 0 10*3/uL (ref 0.0–0.5)
Eosinophils Relative: 0 %
HCT: 27.4 % — ABNORMAL LOW (ref 36.0–46.0)
Hemoglobin: 8.8 g/dL — ABNORMAL LOW (ref 12.0–15.0)
Immature Granulocytes: 1 %
Lymphocytes Relative: 13 %
Lymphs Abs: 1.9 10*3/uL (ref 0.7–4.0)
MCH: 30.7 pg (ref 26.0–34.0)
MCHC: 32.1 g/dL (ref 30.0–36.0)
MCV: 95.5 fL (ref 80.0–100.0)
Monocytes Absolute: 1.3 10*3/uL — ABNORMAL HIGH (ref 0.1–1.0)
Monocytes Relative: 9 %
Neutro Abs: 11.3 10*3/uL — ABNORMAL HIGH (ref 1.7–7.7)
Neutrophils Relative %: 77 %
Platelets: 185 10*3/uL (ref 150–400)
RBC: 2.87 MIL/uL — ABNORMAL LOW (ref 3.87–5.11)
RDW: 15.2 % (ref 11.5–15.5)
WBC: 14.6 10*3/uL — ABNORMAL HIGH (ref 4.0–10.5)
nRBC: 0 % (ref 0.0–0.2)

## 2021-07-16 LAB — COMPREHENSIVE METABOLIC PANEL
ALT: 7 U/L (ref 0–44)
AST: 14 U/L — ABNORMAL LOW (ref 15–41)
Albumin: 2.3 g/dL — ABNORMAL LOW (ref 3.5–5.0)
Alkaline Phosphatase: 61 U/L (ref 38–126)
Anion gap: 8 (ref 5–15)
BUN: 17 mg/dL (ref 8–23)
CO2: 24 mmol/L (ref 22–32)
Calcium: 8.1 mg/dL — ABNORMAL LOW (ref 8.9–10.3)
Chloride: 99 mmol/L (ref 98–111)
Creatinine, Ser: 1.15 mg/dL — ABNORMAL HIGH (ref 0.44–1.00)
GFR, Estimated: 47 mL/min — ABNORMAL LOW (ref >60)
Glucose, Bld: 110 mg/dL — ABNORMAL HIGH (ref 70–99)
Potassium: 4.5 mmol/L (ref 3.5–5.1)
Sodium: 131 mmol/L — ABNORMAL LOW (ref 135–145)
Total Bilirubin: 0.5 mg/dL (ref 0.3–1.2)
Total Protein: 4.8 g/dL — ABNORMAL LOW (ref 6.5–8.1)

## 2021-07-16 LAB — RETICULOCYTES
Immature Retic Fract: 15.6 % (ref 2.3–15.9)
RBC.: 2.86 MIL/uL — ABNORMAL LOW (ref 3.87–5.11)
Retic Count, Absolute: 55.2 10*3/uL (ref 19.0–186.0)
Retic Ct Pct: 1.9 % (ref 0.4–3.1)

## 2021-07-16 LAB — FERRITIN: Ferritin: 35 ng/mL (ref 11–307)

## 2021-07-16 LAB — FOLATE: Folate: 10.6 ng/mL (ref >5.9)

## 2021-07-16 LAB — IRON AND TIBC
Iron: 16 ug/dL — ABNORMAL LOW (ref 28–170)
Saturation Ratios: 6 % — ABNORMAL LOW (ref 10.4–31.8)
TIBC: 249 ug/dL — ABNORMAL LOW (ref 250–450)
UIBC: 233 ug/dL

## 2021-07-16 LAB — MAGNESIUM: Magnesium: 1.8 mg/dL (ref 1.7–2.4)

## 2021-07-16 LAB — VITAMIN B12: Vitamin B-12: 434 pg/mL (ref 180–914)

## 2021-07-16 LAB — PHOSPHORUS: Phosphorus: 2.4 mg/dL — ABNORMAL LOW (ref 2.5–4.6)

## 2021-07-16 MED ORDER — K PHOS MONO-SOD PHOS DI & MONO 155-852-130 MG PO TABS
500.0000 mg | ORAL_TABLET | Freq: Once | ORAL | Status: AC
  Administered 2021-07-16: 500 mg via ORAL
  Filled 2021-07-16: qty 2

## 2021-07-16 NOTE — Evaluation (Addendum)
Physical Therapy Evaluation Patient Details Name: Christina Fry MRN: [id number] DOB: [birth year] Today's Date: 07/16/2021  History of Present Illness  Pt is an 81 y.o. female admitted from [facility] on 07/14/21 after fall when trying to go from w/c to chair and striking her head on floor, found down by staff the next morning. Head/cervical CT negative for acute abnormality. Workup for L intertrochanteric femur fx s/p cephalomedullary nailing 5/3. PMH includes DM, fibromyalgia, kyphoplasty (04/2021), HLD, anxiety, cancer.   Clinical Impression  Pt presents with an overall decrease in functional mobility secondary to above. PTA, pt resident at ILF, typically mod indep with pivot transfers to/from electric or manual w/c. Today, pt requiring mod-maxA+2 for bed mobility and multiple squat pivot transfers; pt limited by significant LLE pain and weakness, with difficulty accepting weight onto LLE in order to take steps; pt also with poor balance strategies and impaired cognition. Pt would benefit from SNF-level therapies to maximize functional mobility and independence prior to return to ILF.     BP 136/88, HR 90    Recommendations for follow up therapy are one component of a multi-disciplinary discharge planning process, led by the attending physician.  Recommendations may be updated based on patient status, additional functional criteria and insurance authorization.  Follow Up Recommendations Skilled nursing-short term rehab (<3 hours/day)    Assistance Recommended at Discharge Intermittent Supervision/Assistance  Patient can return home with the following  A lot of help with walking and/or transfers;A lot of help with bathing/dressing/bathroom;Assist for transportation    Equipment Recommendations None recommended by PT  Recommendations for Other Services       Functional Status Assessment Patient has had a recent decline in their functional status and demonstrates the ability to make  significant improvements in function in a reasonable and predictable amount of time.     Precautions / Restrictions Precautions Precautions: Fall;Other (comment) Precaution Comments: urine incontinence Restrictions Weight Bearing Restrictions: Yes LLE Weight Bearing: Weight bearing as tolerated      Mobility  Bed Mobility Overal bed mobility: Needs Assistance Bed Mobility: Supine to Sit, Sit to Supine     Supine to sit: Max assist, HOB elevated Sit to supine: Max assist, +2 for physical assistance   General bed mobility comments: maxA for BLE management and trunk elevation, pt very particular about therapists provided assistance; maxA+2 return to supine    Transfers Overall transfer level: Needs assistance Equipment used: 1 person hand held assist Transfers: Sit to/from Stand, Bed to chair/wheelchair/BSC Sit to Stand: Mod assist, +2 physical assistance     Squat pivot transfers: Mod assist, Max assist, +2 physical assistance, +2 safety/equipment     General transfer comment: multiple squat pivot transfers from bed>w/c, w/c<>BSC over toilet, w/c>bed, pt requiring consistent mod-maxA+2 for trunk elevation, stability and LLE mobility; pt with significant difficulty accepting weight onto LLE in order to take pivotal steps with RLE; requiring external assist to reposition LLE, noted improvement in ability to scoot L foot on final transfer; pt with poor safety awareness (including locking w/c brakes) requiring frequent cues with transfers    Ambulation/Gait                  Administrator mobility: Yes Wheelchair propulsion: Both upper extremities Wheelchair parts: Needs assistance Distance: 30 Wheelchair Assistance Details (indicate cue type and reason): pt able to self-propel manual w/c from bed<>bathroom, only requiring assist to get  over incline into bathroom door; cues for safety with w/c  features  Modified Rankin (Stroke Patients Only)       Balance Overall balance assessment: Needs assistance   Sitting balance-Leahy Scale: Fair     Standing balance support: Bilateral upper extremity supported, During functional activity Standing balance-Leahy Scale: Poor Standing balance comment: reliant on maxA to maintain partially upright standing, dependent for posterior pericare                             Pertinent Vitals/Pain Pain Assessment Pain Assessment: Faces Faces Pain Scale: Hurts whole lot Pain Location: LLE with mobility Pain Descriptors / Indicators: Grimacing, Guarding, Moaning Pain Intervention(s): Monitored during session, Limited activity within patient's tolerance, Repositioned    Home Living Family/patient expects to be discharged to:: Assisted living                 Home Equipment: Grab bars - toilet;Grab bars - tub/shower;Shower seat;Electric scooter;Cane - single Librarian, academic (2 wheels);Rollator (4 wheels) Additional Comments: Resident at ILF    Prior Function Prior Level of Function : Independent/Modified Independent;History of Falls (last six months)             Mobility Comments: Reports typically mod indep with pivot transfers to/from power or manual w/c ADLs Comments: mod I for BADLs, does not drive     Hand Dominance   Dominant Hand: Right    Extremity/Trunk Assessment   Upper Extremity Assessment Upper Extremity Assessment: Generalized weakness    Lower Extremity Assessment Lower Extremity Assessment: Generalized weakness;LLE deficits/detail LLE Deficits / Details: s/p L femur nailing with expected post-op pain and weakness; pt reports baseline knee flexion contracture (noted pt able to straight L knee more functionally than when asked directly to do so); L hip and knee < 3/5 strength, unable to perform SAQ; ankle pumps WFL LLE: Unable to fully assess due to pain       Communication    Communication: HOH  Cognition Arousal/Alertness: Awake/alert Behavior During Therapy: WFL for tasks assessed/performed Overall Cognitive Status: No family/caregiver present to determine baseline cognitive functioning Area of Impairment: Attention, Memory, Following commands, Safety/judgement, Awareness, Problem solving                   Current Attention Level: Sustained, Selective Memory: Decreased short-term memory Following Commands: Follows one step commands with increased time, Follows multi-step commands inconsistently Safety/Judgement: Decreased awareness of safety, Decreased awareness of deficits Awareness: Emergent Problem Solving: Requires verbal cues          General Comments General comments (skin integrity, edema, etc.): pt c/o dizziness after transfer from toilet, BP 136/88, HR 90; additional c/o dizziness with return to supine, BP 131/43. pt reports her plan is to d/c to SNF for rehab    Exercises General Exercises - Lower Extremity Ankle Circles/Pumps: AROM, Both Short Arc Quad: AAROM, Left, Supine   Assessment/Plan    PT Assessment Patient needs continued PT services  PT Problem List Decreased strength;Decreased range of motion;Decreased activity tolerance;Decreased balance;Decreased mobility;Decreased cognition;Decreased knowledge of use of DME;Decreased safety awareness;Decreased knowledge of precautions;Pain       PT Treatment Interventions DME instruction;Functional mobility training;Therapeutic activities;Therapeutic exercise;Balance training;Patient/family education;Wheelchair mobility training    PT Goals (Current goals can be found in the Care Plan section)  Acute Rehab PT Goals Patient Stated Goal: rehab at SNF before return to ILF PT Goal Formulation: With patient Time For Goal Achievement: 07/30/21 Potential to  Achieve Goals: Good    Frequency Min 3X/week     Co-evaluation PT/OT/SLP Co-Evaluation/Treatment: Yes Reason for Co-Treatment:  Necessary to address cognition/behavior during functional activity;For patient/therapist safety;To address functional/ADL transfers PT goals addressed during session: Mobility/safety with mobility;Balance;Proper use of DME         AM-PAC PT "6 Clicks" Mobility  Outcome Measure Help needed turning from your back to your side while in a flat bed without using bedrails?: A Lot Help needed moving from lying on your back to sitting on the side of a flat bed without using bedrails?: Total Help needed moving to and from a bed to a chair (including a wheelchair)?: Total Help needed standing up from a chair using your arms (e.g., wheelchair or bedside chair)?: Total Help needed to walk in hospital room?: Total Help needed climbing 3-5 steps with a railing? : Total 6 Click Score: 7    End of Session Equipment Utilized During Treatment: Gait belt Activity Tolerance: Patient tolerated treatment well Patient left: in bed;with call bell/phone within reach;with bed alarm set Nurse Communication: Mobility status PT Visit Diagnosis: Other abnormalities of gait and mobility (R26.89);Muscle weakness (generalized) (M62.81);Pain Pain - Right/Left: Left Pain - part of body: Leg    Time: 0981-1914 PT Time Calculation (min) (ACUTE ONLY): 33 min   Charges:   PT Evaluation $PT Eval Moderate Complexity: 1 Mod        Ina Homes, PT, DPT Acute Rehabilitation Services  Pager [phone] Office [phone]  Malachy Chamber 07/16/2021, 12:43 PM

## 2021-07-16 NOTE — Progress Notes (Signed)
Orthopaedic Trauma Progress Note ? ?SUBJECTIVE: Doing fairly well this morning, pain better controlled than yesterday. She denies any chest pain or shortness of breath.  Has been tolerating diet and fluids. No other complaints currently.  Was able to get up into her wheelchair with therapies yesterday. ? ?OBJECTIVE:  ?Vitals:  ? 07/16/21 0545 07/16/21 0728  ?BP: 131/63 119/62  ?Pulse: 85 79  ?Resp: 19 16  ?Temp: 98.2 ?F (36.8 ?C) 99 ?F (37.2 ?C)  ?SpO2: 93% 96%  ? ? ?General: Laying in bed, no acute distress ?Respiratory: No increased work of breathing.  ?Left lower extremity: Dressings removed, incisions are clean, dry, intact.  No significant tenderness with palpation throughout the thigh.  Tolerates gentle ankle range of motion.  Endorses sensation throughout extremity.  + EHL/FHL.  2+ DP pulse ? ?IMAGING: Stable post op imaging.  ? ?LABS:  ?Results for orders placed or performed during the hospital encounter of 07/14/21 (from the past 24 hour(s))  ?CBC with Differential/Platelet     Status: Abnormal  ? Collection Time: 07/16/21  2:31 AM  ?Result Value Ref Range  ? WBC 14.6 (H) 4.0 - 10.5 K/uL  ? RBC 2.87 (L) 3.87 - 5.11 MIL/uL  ? Hemoglobin 8.8 (L) 12.0 - 15.0 g/dL  ? HCT 27.4 (L) 36.0 - 46.0 %  ? MCV 95.5 80.0 - 100.0 fL  ? MCH 30.7 26.0 - 34.0 pg  ? MCHC 32.1 30.0 - 36.0 g/dL  ? RDW 15.2 11.5 - 15.5 %  ? Platelets 185 150 - 400 K/uL  ? nRBC 0.0 0.0 - 0.2 %  ? Neutrophils Relative % 77 %  ? Neutro Abs 11.3 (H) 1.7 - 7.7 K/uL  ? Lymphocytes Relative 13 %  ? Lymphs Abs 1.9 0.7 - 4.0 K/uL  ? Monocytes Relative 9 %  ? Monocytes Absolute 1.3 (H) 0.1 - 1.0 K/uL  ? Eosinophils Relative 0 %  ? Eosinophils Absolute 0.0 0.0 - 0.5 K/uL  ? Basophils Relative 0 %  ? Basophils Absolute 0.0 0.0 - 0.1 K/uL  ? Immature Granulocytes 1 %  ? Abs Immature Granulocytes 0.10 (H) 0.00 - 0.07 K/uL  ?Comprehensive metabolic panel     Status: Abnormal  ? Collection Time: 07/16/21  2:31 AM  ?Result Value Ref Range  ? Sodium 131 (L) 135 -  145 mmol/L  ? Potassium 4.5 3.5 - 5.1 mmol/L  ? Chloride 99 98 - 111 mmol/L  ? CO2 24 22 - 32 mmol/L  ? Glucose, Bld 110 (H) 70 - 99 mg/dL  ? BUN 17 8 - 23 mg/dL  ? Creatinine, Ser 1.15 (H) 0.44 - 1.00 mg/dL  ? Calcium 8.1 (L) 8.9 - 10.3 mg/dL  ? Total Protein 4.8 (L) 6.5 - 8.1 g/dL  ? Albumin 2.3 (L) 3.5 - 5.0 g/dL  ? AST 14 (L) 15 - 41 U/L  ? ALT 7 0 - 44 U/L  ? Alkaline Phosphatase 61 38 - 126 U/L  ? Total Bilirubin 0.5 0.3 - 1.2 mg/dL  ? GFR, Estimated 47 (L) >60 mL/min  ? Anion gap 8 5 - 15  ?Phosphorus     Status: Abnormal  ? Collection Time: 07/16/21  2:31 AM  ?Result Value Ref Range  ? Phosphorus 2.4 (L) 2.5 - 4.6 mg/dL  ?Magnesium     Status: None  ? Collection Time: 07/16/21  2:31 AM  ?Result Value Ref Range  ? Magnesium 1.8 1.7 - 2.4 mg/dL  ?Vitamin B12     Status: None  ?  Collection Time: 07/16/21  2:31 AM  ?Result Value Ref Range  ? Vitamin B-12 434 180 - 914 pg/mL  ?Folate     Status: None  ? Collection Time: 07/16/21  2:31 AM  ?Result Value Ref Range  ? Folate 10.6 >5.9 ng/mL  ?Iron and TIBC     Status: Abnormal  ? Collection Time: 07/16/21  2:31 AM  ?Result Value Ref Range  ? Iron 16 (L) 28 - 170 ug/dL  ? TIBC 249 (L) 250 - 450 ug/dL  ? Saturation Ratios 6 (L) 10.4 - 31.8 %  ? UIBC 233 ug/dL  ?Ferritin     Status: None  ? Collection Time: 07/16/21  2:31 AM  ?Result Value Ref Range  ? Ferritin 35 11 - 307 ng/mL  ?Reticulocytes     Status: Abnormal  ? Collection Time: 07/16/21  2:31 AM  ?Result Value Ref Range  ? Retic Ct Pct 1.9 0.4 - 3.1 %  ? RBC. 2.86 (L) 3.87 - 5.11 MIL/uL  ? Retic Count, Absolute 55.2 19.0 - 186.0 K/uL  ? Immature Retic Fract 15.6 2.3 - 15.9 %  ? ? ?ASSESSMENT: Christina Fry is a 81 y.o. female, 2 Days Post-Op s/p ?CEPHALOMEDULLARY NAILING LEFT INTERTROCHANTERIC FEMUR FRACTURE ? ?CV/Blood loss: Acute blood loss anemia, Hgb 8.8 this morning. Continue to monitor CBC. Hemodynamically stable ? ?PLAN: ?Weightbearing: WBAT LLE ?ROM: Okay for hip and knee range of motion as  tolerated ?Incisional and dressing care: Ok to leave open to air ?Showering: Okay to begin showering with assistance 07/17/2021.  Incisions may get wet at this time ?Orthopedic device(s):  Gilford Rile, wheelchair   ?Pain management:  ?1. Tylenol 650 mg q 6 hours scheduled ?2. Robaxin 500 mg q 6 hours PRN ?3. Oxycodone 5 mg q 4 hours PRN ?4. Dilaudid 0.5 mg q 2 hours PRN ?5. Gabapentin 200 mg twice daily ?VTE prophylaxis:  Eliquis , SCDs ?ID:  Ancef 2gm post op completed ?Foley/Lines:  No foley, KVO IVFs ?Impediments to Fracture Healing: Vitamin D level 65, no additional supplementation indicated ?Dispo: Therapies as tolerated. Patient lives at [facility], will likely need short-term SNF at discharge. TOC following. Patient ok for d/c from ortho standpoint once cleared by therapies and medicine team. ? ?D/C recommendations: ?-Oxycodone for pain control ?-Continue home dose Eliquis for DVT prophylaxis ?-No additional Vit D supplementation needed ? ?Follow - up plan: 2 weeks after discharge for wound check and repeat x-rays ? ? ?Contact information:  Katha Hamming MD, Rushie Nyhan PA-C. After hours and holidays please check [email or web address] for group call information for [facility] ? ? ?Gwinda Passe, PA-C ?[phone] (office) ?[email or web address] ? ? ? ?

## 2021-07-16 NOTE — [place] FL2 (Signed)
?[place] MEDICAID FL2 LEVEL OF CARE SCREENING TOOL  ?  ? ?IDENTIFICATION  ?Patient Name: ?Christina Fry Birthdate: 08/30/1940 Sex: female Admission Date (Current Location): ?07/14/2021  ?[place] and [place] Number: ? [place] ?  Facility and Address:  ?[facility] [place] [address], [place], [place] [address] ?     Provider Number: ?[id number]  ?Attending Physician Name and Address:  ?Raiford Noble [place], DO ? Relative Name and Phone Number:  ?[name] [phone] ?   ?Current Level of Care: ?Hospital Recommended Level of Care: ?[place] Prior Approval Number: ?  ? ?Date Approved/Denied: ?  PASRR Number: ?[id number] A ? ?Discharge Plan: ?SNF ?  ? ?Current Diagnoses: ?Patient Active Problem List  ? Diagnosis Date Noted  ? Protein-calorie malnutrition, severe 07/15/2021  ? Closed left hip fracture ([place]) 07/14/2021  ? Constipation 05/09/2021  ? T12 compression fracture ([place]) 05/05/2021  ? 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 Hyponatremia 05/05/2021  ? History of pulmonary embolus (PE) 05/05/2021  ? Anemia 05/05/2021  ? Vitamin B12 deficiency 05/05/2021  ? Iron deficiency anemia 05/05/2021  ? Acute conjunctivitis of right eye 05/05/2021  ? Back pain 04/28/2021  ? Hypokalemia 04/28/2021  ? Hypocalcemia 04/28/2021  ? Hypomagnesemia 04/28/2021  ? Mild intermittent asthma, uncomplicated 89/[birth year]/1941  ? Near syncope 08/26/2020  ? Pulmonary embolism ([place]) 08/26/2020  ? Weakness of both lower extremities 11/29/2019  ? Generalized weakness 11/23/2019  ? PMR (polymyalgia rheumatica) (HCC) 11/23/2019  ? GERD (gastroesophageal reflux disease) 11/23/2019  ? Fibromyalgia 11/23/2019  ? Essential hypertension 11/23/2019  ? Hypothyroidism 11/23/2019  ? Ambulatory dysfunction 11/23/2019  ? Accident due to mechanical fall without injury 11/23/2019  ? TIA (transient ischemic attack) 11/23/2019  ? Seasonal and perennial allergic rhinitis 04/04/2018  ? Mild intermittent asthma without complication  74/10/1446  ? ? ?Orientation RESPIRATION BLADDER Height & Weight   ?  ?Self, Time, Situation, Place ? Normal Continent Weight: 175 lb 0.7 oz (79.4 kg) ?Height:  '5\' 3"'$  (160 cm)  ?BEHAVIORAL SYMPTOMS/MOOD NEUROLOGICAL BOWEL NUTRITION STATUS  ?    Continent Diet (refer to d/c summary)  ?AMBULATORY STATUS COMMUNICATION OF NEEDS Skin   ?Extensive Assist Verbally Surgical wounds (L leg incision w/ Silicone dressing) ?  ?  ?  ?    ?     ?     ? ? ?Personal Care Assistance Level of Assistance  ?Bathing, Feeding, Dressing Bathing Assistance: Maximum assistance ?Feeding assistance: Independent ?Dressing Assistance: Maximum assistance ?   ? ?Functional Limitations Info  ?Sight, Hearing, Speech Sight Info: Adequate ?Hearing Info: Adequate ?Speech Info: Adequate  ? ? ?SPECIAL CARE FACTORS FREQUENCY  ?PT (By licensed PT), OT (By licensed OT)   ?  ?PT Frequency: 5x/week , evaluate and treat ?OT Frequency: 5x/week , evaluate and treat ?  ?  ?  ?   ? ? ?Contractures Contractures Info: Not present  ? ? ?Additional Factors Info  ?Code Status, Allergies Code Status Info: DNR ?Allergies Info: Iohexol   Codeine   Fentanyl   Morphine And Related   Nitrofuran Derivatives   Oxycodone-acetaminophen ?  ?  ?  ?   ? ?Current Medications (07/16/2021):  This is the current hospital active medication list ?Current Facility-Administered Medications  ?Medication Dose Route Frequency Provider Last Rate Last Admin  ? acetaminophen (TYLENOL) tablet 650 mg  650 mg Oral Q6H Rushie Nyhan A, PA-C   650 mg at 07/16/21 1338  ? acyclovir (ZOVIRAX) 200 MG capsule 200 mg  200 mg  Oral BID Raiford Noble [place], DO   200 mg at 07/16/21 1761  ? albuterol (PROVENTIL) (2.5 MG/3ML) 0.083% nebulizer solution 3 mL  3 mL Inhalation Q6H PRN Raiford Noble [place], DO      ? apixaban Arne [place]) tablet 2.5 mg  2.5 mg Oral BID Joselyn Glassman A, RPH   2.5 mg at 07/16/21 6073  ? atorvastatin (LIPITOR) tablet 20 mg  20 mg Oral Daily Raiford Noble [place], DO   20 mg at 07/16/21 7106   ? cholecalciferol (VITAMIN D3) tablet 1,000 Units  1,000 Units Oral Daily Corinne Ports, PA-C   1,000 Units at 07/16/21 2694  ? docusate sodium (COLACE) capsule 100 mg  100 mg Oral BID Corinne Ports, PA-C   100 mg at 07/16/21 8546  ? feeding supplement (ENSURE ENLIVE / ENSURE PLUS) liquid 237 mL  237 mL Oral BID BM SheikhGeorgina Quint [place], DO   237 mL at 07/16/21 1345  ? gabapentin (NEURONTIN) capsule 200 mg  200 mg Oral BID Corinne Ports, PA-C   200 mg at 07/16/21 2703  ? HYDROmorphone (DILAUDID) injection 0.5 mg  0.5 mg Intravenous Q2H PRN Corinne Ports, PA-C   0.5 mg at 07/16/21 5009  ? hydrOXYzine (ATARAX) tablet 10 mg  10 mg Oral BID PRN Corinne Ports, PA-C      ? levothyroxine (SYNTHROID) tablet 100 mcg  100 mcg Oral QAC breakfast Corinne Ports, PA-C   100 mcg at 07/16/21 3818  ? methocarbamol (ROBAXIN) tablet 500 mg  500 mg Oral Q6H PRN Corinne Ports, PA-C   500 mg at 07/16/21 0813  ? Or  ? methocarbamol (ROBAXIN) 500 mg in dextrose 5 % 50 mL IVPB  500 mg Intravenous Q6H PRN Thereasa Solo, Sarah A, PA-C      ? metoCLOPramide (REGLAN) tablet 5-10 mg  5-10 mg Oral Q8H PRN Corinne Ports, PA-C   10 mg at 07/15/21 1703  ? Or  ? metoCLOPramide (REGLAN) injection 5-10 mg  5-10 mg Intravenous Q8H PRN Corinne Ports, PA-C      ? metoprolol succinate (TOPROL-XL) 24 hr tablet 50 mg  50 mg Oral Daily Rushie Nyhan A, PA-C   50 mg at 07/16/21 2993  ? multivitamin with minerals tablet 1 tablet  1 tablet Oral Daily Raiford Noble [place], DO   1 tablet at 07/16/21 0813  ? nortriptyline (PAMELOR) capsule 50 mg  50 mg Oral QHS Raiford Noble [place], DO   50 mg at 07/15/21 2217  ? ondansetron (ZOFRAN) tablet 4 mg  4 mg Oral Q6H PRN Corinne Ports, PA-C   4 mg at 07/15/21 1521  ? Or  ? ondansetron (ZOFRAN) injection 4 mg  4 mg Intravenous Q6H PRN Corinne Ports, PA-C   4 mg at 07/15/21 0006  ? oxyCODONE (Oxy IR/ROXICODONE) immediate release tablet 5 mg  5 mg Oral Q4H PRN Corinne Ports, PA-C   5 mg at  07/16/21 1338  ? pantoprazole (PROTONIX) EC tablet 40 mg  40 mg Oral Daily Corinne Ports, PA-C   40 mg at 07/16/21 0813  ? polyethylene glycol (MIRALAX / GLYCOLAX) packet 17 g  17 g Oral Daily PRN Thereasa Solo, Sarah A, PA-C      ? polyvinyl alcohol (LIQUIFILM TEARS) 1.4 % ophthalmic solution 1 drop  1 drop Both Eyes TID PRN Joselyn Glassman A, RPH   1 drop at 07/15/21 1522  ? ? ? ?Discharge Medications: ?Please see discharge summary for a list of  discharge medications. ? ?Relevant Imaging Results: ? ?Relevant Lab Results: ? ? ?Additional Information ?SSN#: [id number] ? ?Coralee Pesa, LCSWA ? ? ? ? ?

## 2021-07-16 NOTE — Progress Notes (Signed)
?PROGRESS NOTE ? ? ? ?Christina Fry  JAS:[id number] DOB: [birth year] DOA: 07/14/2021 ?PCP: Ferdie Ping, MD  ? ?Brief Narrative:  ?HPI per Dr. Phillips Climes on 07/14/21 ? Christina Fry  is a 81 y.o. female, with past medical history of polymyalgia rheumatica, diabetes, asthma, Graves' disease, prior PE on Eliquis, patient presents to ED secondary to fall, patient report fall happened last time, she is mostly wheelchair bound, transfers between chair/bed and wheelchair at her independent living facility, reports last night she was trying to go from wheelchair to chair, she fell back striking her head into the hard floor, as well as her left leg, she was unable to stand up, or to call for help, and laid on the floor from 8 last night until this morning when she was found by staff, and reports severe headache from where she hit her head on the floor, neck pain, left hip pain, left knee pain, she was given fentanyl by EMS, she denies any focal deficits, no fever, no chills,. ?-In ED no significant lab abnormalities, CT head with no evidence of acute findings, findings significant for left hip fracture, ED discussed with orthopedic Dr. Doreatha Martin, who reports plan for surgery today, last dose of Eliquis was yesterday morning. ? ?**Interim History ?Underwent surgical intervention and is doing well postoperatively.  WBC is slightly went up and she does have an acute blood loss anemia as expected.  Need to continue monitor for signs of the bleeding.  PT OT recommending SNF. ?  ?Assessment and Plan: ? ?Left Hip Fracture status post cephalomedullary nailing of the left intertrochanteric femur fracture postoperative day 2 ?-Due to mechanical fall, orthopedic input greatly appreciated, plan for surgical repair today, she is admitted under hip fracture pathway. ?-Was npo for Surgery and now on a Diet  ?-Eliquis now resumed ?-We will consult PT/OT, and TOC as likely will need SNF or rehab placement ?-Patient and daughter understand  risks of surgery, she is mild to moderate risk, and they are agreeable to proceed ?-Patient reports she is DNR, but she understands it will be temporary suspended and she will be full code during procedure . ?-Continue with Pain Control as delineated per Ortho Recc's; she continues to have some pain and pain was little bit better controlled today than it was yesterday ?-Surgery recommends weightbearing as tolerated on left lower extremity and recommending okay for hip and knee range of motion as tolerated.  They are recommending to remove dressings on 5 5 and leave open to air with dry gauze as needed and okay to begin showering on 07/17/2021. ?-PT and OT to evaluate and treat and they are recommending SNF short-term as she lives at [facility]. ?-Orthopedic surgery recommending oxycodone for as needed pain control at discharge and following up 2 weeks after discharge for wound check and repeat x-rays ?  ?Hyperlipidemia ?-Continue with Atorvastatin 20 mg po daily  ?  ?ABLA as a cause of Post-Operative Anemia ?-Patient's Hgb/Hct went from 14.6/43.0 -> 12.9/41.6 -> 10.4/32.8 -> 8.8/27.4 ?-Could have had a Dilutional Component as well ?-Check Anemia Panel and showed an iron level of 16, U IBC of 233, TIBC of 249, saturation ratio of 6%, ferritin level 35, folate level 10.6, vitamin B 12 level 434 ?-Continue to Monitor for S/Sx of Bleeding; No Overt Bleeding noted ?-Repeat CBC in the AM  ? ?CKD Stage 3a ?-Patient's BUN/Cr went from 15/1.00 -> 11/1.05 -> 14/1.04 -> 17/1.15 ?-Avoid further nephrotoxic medications, contrast dyes, hypotension and and  dehydration to ensure adequate renal perfusion and will need to renally adjust medications ?-Repeat CMP in the AM  ?  ?History of Pulmonary Embolism ?-Per Orthopedic Surgery ok to resume Apixaban and now back on 2.5 mg BID  ? ?Hypophosphatemia ?-Patient's Phos Level is now 2.4 ?-Replete with po K Phos Neutral 500 mg x1 ?-Continue to Monitor and Replete as  Necessary ?-Repeat Phos Level in the AM  ?  ?Ambulatory dysfunction/dependent on wheelchair ?-We will consult PT/OT, likely will need SNF placement ?  ?Hyponatremia ?-Patient's sodium went from 140 and dropped to 131 x2 ?-We have discontinued her LR now ?-Continue monitor and trend and repeat CMP in a.m. ? ?Leukocytosis ?-Likely reactive in the setting of Fall and Surgery and was improving and now slightly worsened ?-WBC went from 18.2 -> 13.1 -> 14.6 ?-Continue to Monitor and Trend ?-Repeat CBC in the AM  ?  ?Hypothyroidism ?-Continue with Levothyroxine 100 mcg po Daily  ?  ?Hypertension ?-Continue with home medications, will continue with Metoprolol Succinate 50 mg po Daily  ?-Continue to Monitor BP per Protocol ?-Last BP reading was 104/54 ?  ?GERD/GI Prophylaxis ?-Continue with PPI with Pantoprazole 40 mg po Daily  ?  ?Obesity ?-Complicates overall prognosis and care ?-Estimated body mass index is 31.01 kg/m? as calculated from the following: ?  Height as of this encounter: '5\' 3"'$  (1.6 m). ?  Weight as of this encounter: 79.4 kg.  ?-Weight Loss and Dietary Counseling given ? ?DVT prophylaxis: apixaban (ELIQUIS) tablet 2.5 mg Start: 07/15/21 1000 ?SCDs Start: 07/14/21 1553 ?apixaban (ELIQUIS) tablet 2.5 mg  ?  Code Status: DNR ?Family Communication: No family present at bedside  ? ?Disposition Plan:  ?Level of care: Med-Surg ?Status is: Inpatient ?Remains inpatient appropriate because: Needs SNF ?  ?Consultants:  ?Orthopedic Surgery ? ?Procedures:  ?CEPHALOMEDULLARY NAILING LEFT INTERTROCHANTERIC FEMUR FRACTURE ? ?Antimicrobials:  ?Anti-infectives (From admission, onward)  ? ? Start     Dose/Rate Route Frequency Ordered Stop  ? 07/15/21 1400  acyclovir (ZOVIRAX) 200 MG capsule 200 mg       ? 200 mg Oral 2 times daily 07/15/21 1309    ? 07/14/21 2000  ceFAZolin (ANCEF) IVPB 1 g/50 mL premix       ? 1 g ?100 mL/hr over 30 Minutes Intravenous Every 8 hours 07/14/21 1552 07/15/21 1407  ? 07/14/21 1200  ceFAZolin  (ANCEF) IVPB 2g/100 mL premix       ? 2 g ?200 mL/hr over 30 Minutes Intravenous On call to O.R. 07/14/21 1155 07/14/21 1255  ? ?  ?  ?Subjective: ?Seen and examined at bedside and thinks her pain is better controlled today.  No nausea or vomiting.  Feels okay.  Denies any lightheadedness or dizziness.  No other concerns or complaints this time. ? ?Objective: ?Vitals:  ? 07/15/21 1907 07/16/21 0545 07/16/21 0728 07/16/21 1501  ?BP: 140/72 131/63 119/62 (!) 104/54  ?Pulse: 91 85 79 85  ?Resp: '17 19 16 11  '$ ?Temp: 99 ?F (37.2 ?C) 98.2 ?F (36.8 ?C) 99 ?F (37.2 ?C) 98.4 ?F (36.9 ?C)  ?TempSrc:   Oral Oral  ?SpO2: 91% 93% 96% 94%  ?Weight:      ?Height:      ? ? ?Intake/Output Summary (Last 24 hours) at 07/16/2021 1936 ?Last data filed at 07/16/2021 1311 ?Gross per 24 hour  ?Intake 960 ml  ?Output 300 ml  ?Net 660 ml  ? ?Filed Weights  ? 07/14/21 6761 07/14/21 1232  ?Weight: 79.4 kg 79.4  kg  ? ?Examination: ?Physical Exam: ? ?Constitutional: WN/WD obese elderly Caucasian female in NAD ?Respiratory: Diminished to auscultation bilaterally, no wheezing, rales, rhonchi or crackles. Normal respiratory effort and patient is not tachypenic. No accessory muscle use. Unlabored breathing  ?Cardiovascular: RRR, no murmurs / rubs / gallops. S1 and S2 auscultated. Slight  extremity edema. 2+ pedal pulses. No carotid bruits.  ?Abdomen: Soft, non-tender, Distended 2/2 body habitus. Bowel sounds positive.  ?GU: Deferred. ?Musculoskeletal: No clubbing / cyanosis of digits/nails. No joint deformity upper and lower extremities ?Neurologic: CN 2-12 grossly intact with no focal deficits. Romberg sign and cerebellar reflexes not assessed.  ?Psychiatric: Normal judgment and insight. Alert and oriented x 3. Normal mood and appropriate affect.  ? ?Data Reviewed: I have personally reviewed following labs and imaging studies ? ?CBC: ?Recent Labs  ?Lab 07/14/21 ?1004 07/14/21 ?1005 07/15/21 ?0213 07/16/21 ?0231  ?WBC  --  18.2* 13.1* 14.6*  ?NEUTROABS   --   --   --  11.3*  ?HGB 14.6 12.9 10.4* 8.8*  ?HCT 43.0 41.6 32.8* 27.4*  ?MCV  --  97.4 95.6 95.5  ?PLT  --  273 232 185  ? ?Basic Metabolic Panel: ?Recent Labs  ?Lab 07/14/21 ?1004 07/14/21 ?1005 07/15/21 ?0213 07/16/21 ?

## 2021-07-16 NOTE — Plan of Care (Signed)

## 2021-07-16 NOTE — Plan of Care (Signed)

## 2021-07-16 NOTE — Evaluation (Signed)
Occupational Therapy Evaluation ?Patient Details ?Name: Christina Fry ?MRN: [id number] ?DOB: 04-Apr-1940 ?Today's Date: 07/16/2021 ? ? ?History of Present Illness 81 y.o. female admitted from [facility] on 07/14/21 after fall when trying to go from w/c to chair and striking her head on floor, found down by staff the next morning. Head/cervical CT negative for acute abnormality. Workup for L intertrochanteric femur fx s/p cephalomedullary nailing 5/3. PMH includes DM, fibromyalgia, kyphoplasty (04/2021), HLD, anxiety, cancer.  ? ?Clinical Impression ?  ?Patient is s/p L intertrochanteric cephalomedullary nailing  surgery resulting in functional limitations due to the deficits listed below (see OT problem list). Pt demonstrates high fall risk and lack of awareness. Pt will require (A) for all transfers +2 (A) and w/c use. Pt was able to self propel w/c once in the chair.  Patient will benefit from skilled OT acutely to increase independence and safety with ADLS to allow discharge SNF. ?  ?   ? ?Recommendations for follow up therapy are one component of a multi-disciplinary discharge planning process, led by the attending physician.  Recommendations may be updated based on patient status, additional functional criteria and insurance authorization.  ? ?Follow Up Recommendations ? Skilled nursing-short term rehab (<3 hours/day)  ?  ?Assistance Recommended at Discharge Set up Supervision/Assistance  ?Patient can return home with the following Two people to help with bathing/dressing/bathroom;Assist for transportation ? ?  ?Functional Status Assessment ? Patient has had a recent decline in their functional status and demonstrates the ability to make significant improvements in function in a reasonable and predictable amount of time.  ?Equipment Recommendations ? BSC/3in1;Hospital bed  ?  ?Recommendations for Other Services   ? ? ?  ?Precautions / Restrictions Precautions ?Precautions: Fall;Other (comment) ?Precaution  Comments: urine incontinence/ TLSO brace in room ?Restrictions ?Weight Bearing Restrictions: Yes ?LLE Weight Bearing: Weight bearing as tolerated  ? ?  ? ?Mobility Bed Mobility ?Overal bed mobility: Needs Assistance ?Bed Mobility: Supine to Sit, Sit to Supine ?  ?  ?Supine to sit: Max assist, HOB elevated ?Sit to supine: Max assist, +2 for physical assistance ?  ?General bed mobility comments: maxA for BLE management and trunk elevation, pt very particular about therapists provided assistance; maxA+2 return to supine. pt expressed only wanting hand placement behind needs to help helicopter to EOB with legs together support. ?  ? ?Transfers ?Overall transfer level: Needs assistance ?Equipment used: 1 person hand held assist ?Transfers: Sit to/from Stand, Bed to chair/wheelchair/BSC ?Sit to Stand: Mod assist, +2 physical assistance ?  ?Squat pivot transfers: Mod assist, Max assist, +2 physical assistance, +2 safety/equipment ?  ?  ?  ?General transfer comment: multiple squat pivot transfers from bed>w/c, w/c<>BSC over toilet, w/c>bed, pt requiring consistent mod-maxA+2 for trunk elevation, stability and LLE mobility; pt with significant difficulty accepting weight onto LLE in order to take pivotal steps with RLE; requiring external assist to reposition LLE, noted improvement in ability to scoot L foot on final transfer; pt with poor safety awareness (including locking w/c brakes) requiring frequent cues with transfers ?  ? ?  ?Balance Overall balance assessment: Needs assistance ?  ?Sitting balance-Leahy Scale: Fair ?  ?  ?Standing balance support: Bilateral upper extremity supported, During functional activity ?Standing balance-Leahy Scale: Poor ?Standing balance comment: reliant on maxA to maintain partially upright standing, dependent for posterior pericare ?  ?  ?  ?  ?  ?  ?  ?  ?  ?  ?  ?   ? ?  ADL either performed or assessed with clinical judgement  ? ?ADL Overall ADL's : Needs  assistance/impaired ?Eating/Feeding: Independent ?  ?Grooming: Dance movement psychotherapist;Modified independent;Sitting ?  ?Upper Body Bathing: Minimal assistance;Sitting ?Upper Body Bathing Details (indicate cue type and reason): requries supported ?Lower Body Bathing: +2 for physical assistance;Maximal assistance;Sit to/from stand ?  ?Upper Body Dressing : Minimal assistance ?  ?Lower Body Dressing: +2 for physical assistance;Maximal assistance;Sit to/from stand ?  ?Toilet Transfer: +2 for physical assistance;Maximal assistance;BSC/3in1;Grab bars;Squat-pivot ?Toilet Transfer Details (indicate cue type and reason): pt is unable to advance either bil LE due to inability to weight shift on LLE with pain ?Toileting- Clothing Manipulation and Hygiene: Maximal assistance;+2 for physical assistance;+2 for safety/equipment;Sit to/from stand ?Toileting - Clothing Manipulation Details (indicate cue type and reason): pt sitting able to wash thighs with wet wipe. pt is unable to reach between [facility] rim and body to wipe ?  ?  ?  ?General ADL Comments: pt baseline stand pivots for transfers to the R side. pt reports the L side has always been harder.  ? ? ? ?Vision Baseline Vision/History: 1 Wears glasses ?Ability to See in Adequate Light: 0 Adequate ?   ?   ?Perception   ?  ?Praxis   ?  ? ?Pertinent Vitals/Pain Pain Assessment ?Pain Assessment: Faces ?Faces Pain Scale: Hurts whole lot ?Pain Location: LLE with mobility ?Pain Descriptors / Indicators: Grimacing, Guarding, Moaning ?Pain Intervention(s): Monitored during session, Premedicated before session, Repositioned  ? ? ? ?Hand Dominance Right ?  ?Extremity/Trunk Assessment Upper Extremity Assessment ?Upper Extremity Assessment: Overall WFL for tasks assessed ?  ?Lower Extremity Assessment ?Lower Extremity Assessment: Generalized weakness;Defer to PT evaluation ?LLE Deficits / Details: s/p L femur pt reports baseline knee flexion contracture (noted pt able to straight L knee more  functionally than when asked directly to do so);pt insisting on a small pillow ( personal from home) placed behind L knee with education x2 during session to not place pillow. pt states "i have to have it" ?LLE: Unable to fully assess due to pain ?  ?Cervical / Trunk Assessment ?Cervical / Trunk Assessment: Kyphotic;Other exceptions (pt with back brace and declines brace states "i cant wear that now it will push on my incision") ?  ?Communication Communication ?Communication: HOH ?  ?Cognition Arousal/Alertness: Awake/alert ?Behavior During Therapy: [facility] for tasks assessed/performed ?Overall Cognitive Status: No family/caregiver present to determine baseline cognitive functioning ?Area of Impairment: Attention, Memory, Following commands, Safety/judgement, Awareness, Problem solving ?  ?  ?  ?  ?  ?  ?  ?  ?  ?Current Attention Level: Selective ?Memory: Decreased short-term memory ?Following Commands: Follows one step commands with increased time, Follows multi-step commands inconsistently ?Safety/Judgement: Decreased awareness of safety, Decreased awareness of deficits ?Awareness: Emergent ?Problem Solving: Requires verbal cues ?General Comments: OT presented with tissue for toileting and pt with poor recall by asking for tissue less than a minute later. Pt requires direction at times for safety and pt states "well that would make sense". Pt using humor to mask cognitive deficits noted during session. Pt could benefit from further cognitive assessment for safety and executive functional deficits. ?  ?  ?General Comments  c/o dizziness after toilet transfers and obtained BP  136/88 Pt reports daughter will manage all d/c planning ? ?  ?Exercises Exercises: Other exercises ?General Exercises - Lower Extremity ?Ankle Circles/Pumps: AROM, Both, 10 reps, Seated ?Short Arc Quad: AAROM, Left, Supine, 5 reps ?  ?Shoulder Instructions    ? ? ?  Home Living Family/patient expects to be discharged to:: Assisted living ?  ?  ?  ?   ?  ?  ?  ?  ?  ?  ?  ?  ?  ?  ?Home Equipment: Grab bars - toilet;Grab bars - tub/shower;Shower seat;Electric scooter;Cane - single Barista (2 wheels);Rollator (4 wheels);Wheelchair - manual;Other (comment) (lap tray to w/c) ?  ?Additi

## 2021-07-16 NOTE — Discharge Instructions (Signed)
? ?Orthopaedic Trauma Service Discharge Instructions ? ? ?General Discharge Instructions ? ?WEIGHT BEARING STATUS:weightbearing as tolerated ? ?RANGE OF MOTION/ACTIVITY: No range of motion restrictions ? ?Wound Care: Incisions can be left open to air if there is no drainage. If incision continues to have drainage, follow wound care instructions below. Okay to shower if no drainage from incisions. ? ?DVT/PE prophylaxis:  Eliquis ? ?Diet: as you were eating previously.  Can use over the counter stool softeners and bowel preparations, such as Miralax, to help with bowel movements.  Narcotics can be constipating.  Be sure to drink plenty of fluids ? ?PAIN MEDICATION USE AND EXPECTATIONS ? You have likely been given narcotic medications to help control your pain.  After a traumatic event that results in an fracture (broken bone) with or without surgery, it is ok to use narcotic pain medications to help control one's pain.  We understand that everyone responds to pain differently and each individual patient will be evaluated on a regular basis for the continued need for narcotic medications. Ideally, narcotic medication use should last no more than 6-8 weeks (coinciding with fracture healing).  ? As a patient it is your responsibility as well to monitor narcotic medication use and report the amount and frequency you use these medications when you come to your office visit.  ? We would also advise that if you are using narcotic medications, you should take a dose prior to therapy to maximize you participation. ? ?IF YOU ARE ON NARCOTIC MEDICATIONS IT IS NOT PERMISSIBLE TO OPERATE A MOTOR VEHICLE (MOTORCYCLE/CAR/TRUCK/MOPED) OR HEAVY MACHINERY ?DO NOT MIX NARCOTICS WITH OTHER CNS (Hissop) DEPRESSANTS SUCH AS ALCOHOL ? ? ?STOP SMOKING OR USING NICOTINE PRODUCTS!!!! ? As discussed nicotine severely impairs your body's ability to heal surgical and traumatic wounds but also impairs bone healing.  Wounds and  bone heal by forming microscopic blood vessels (angiogenesis) and nicotine is a vasoconstrictor (essentially, shrinks blood vessels).  Therefore, if vasoconstriction occurs to these microscopic blood vessels they essentially disappear and are unable to deliver necessary nutrients to the healing tissue.  This is one modifiable factor that you can do to dramatically increase your chances of healing your injury.   ? (This means no smoking, no nicotine gum, patches, etc) ? ?DO NOT USE NONSTEROIDAL ANTI-INFLAMMATORY DRUGS (NSAID'S) ? Using products such as Advil (ibuprofen), Aleve (naproxen), Motrin (ibuprofen) for additional pain control during fracture healing can delay and/or prevent the healing response.  If you would like to take over the counter (OTC) medication, Tylenol (acetaminophen) is ok.  However, some narcotic medications that are given for pain control contain acetaminophen as well. Therefore, you should not exceed more than 4000 mg of tylenol in a day if you do not have liver disease.  Also note that there are may OTC medicines, such as cold medicines and allergy medicines that my contain tylenol as well.  If you have any questions about medications and/or interactions please ask your doctor/PA or your pharmacist.  ?   ? ?ICE AND ELEVATE INJURED/OPERATIVE EXTREMITY ? Using ice and elevating the injured extremity above your heart can help with swelling and pain control.  Icing in a pulsatile fashion, such as 20 minutes on and 20 minutes off, can be followed.   ? Do not place ice directly on skin. Make sure there is a barrier between to skin and the ice pack.   ? Using frozen items such as frozen peas works well as the conform nicely to  the are that needs to be iced. ? ?USE AN ACE WRAP OR TED HOSE FOR SWELLING CONTROL ? In addition to icing and elevation, Ace wraps or TED hose are used to help limit and resolve swelling.  It is recommended to use Ace wraps or TED hose until you are informed to stop.   ? When  using Ace Wraps start the wrapping distally (farthest away from the body) and wrap proximally (closer to the body) ?  Example: If you had surgery on your leg or thing and you do not have a splint on, start the ace wrap at the toes and work your way up to the thigh ?       If you had surgery on your upper extremity and do not have a splint on, start the ace wrap at your fingers and work your way up to the upper arm ? ? ?CALL THE OFFICE WITH ANY QUESTIONS OR CONCERNS: [phone]  ? ?VISIT OUR WEBSITE FOR ADDITIONAL INFORMATION: [email or web address] ?  ? ?Discharge Wound Care Instructions ? ?Do NOT apply any ointments, solutions or lotions to pin sites or surgical wounds.  These prevent needed drainage and even though solutions like hydrogen peroxide kill bacteria, they also damage cells lining the pin sites that help fight infection.  Applying lotions or ointments can keep the wounds moist and can cause them to breakdown and open up as well. This can increase the risk for infection. When in doubt call the office. ? ?If any drainage is noted, use foam dressing ?- These dressing supplies should be available at local medical supply stores [facility], etc) as well as Management consultant ([facility], [facility], [facility], etc) ? ?Once the incision is completely dry and without drainage, it may be left open to air out.  Showering may begin 36-48 hours later.  Cleaning gently with soap and water. ?

## 2021-07-16 NOTE — TOC Progression Note (Addendum)
Transition of Care (TOC) - Progression Note  ? ? ?Patient Details  ?Name: Christina Fry ?MRN: [id number] ?Date of Birth: 10/22/1940 ? ?Transition of Care (TOC) CM/SW Contact  ?Joanne Chars, LCSW ?Phone Number: ?07/16/2021, 2:46 PM ? ?Clinical Narrative:   CSW spoke with pt regarding PT recommendation for SNF.  She requested CSW coordinate plan with daughter Christina Fry.  CSW spoke with Christina Fry by phone, pt was just at [facility] in March, spent a little over a week there, pt cannot afford copay days.  Pt does have medicare part A to go along with her VA benefits.  Christina Fry asked CSW to talk with pt about their opinion on whether pt would need more than 10 days of SNF currently.  Per [facility], pt was there 05/12/21-05/21/21.  Admitted back to [facility] on 5/3--would be less than 60 days.  CSW did speak with PT and they do anticipate need for longer rehab stay, this was passed along to [place] who does want to work on finding a [place] SNF.  Pt has been to [place] SNF before and they are aware of limited choices and facilities that are farther away. ? ?CSW filled out the [facility] checklist form and faxed the required documents to the [place] for approval. ? ? ? ?Expected Discharge Plan: [facility] (SNF) ?Barriers to Discharge: Continued Medical Work up ? ?Expected Discharge Plan and Services ?Expected Discharge Plan: [facility] (SNF) ?  ?Discharge Planning Services: CM Consult ?  ?Living arrangements for the past 2 months: Apartment ?                ?  ?  ?  ?  ?  ?  ?  ?  ?  ?  ? ? ?Social Determinants of Health (SDOH) Interventions ?  ? ?Readmission Risk Interventions ? ?  08/28/2020  ?  1:48 PM 12/06/2019  ?  2:10 PM  ?Readmission Risk Prevention Plan  ?Transportation Screening Complete Complete  ?[place] or Home Care Consult Complete   ?Social Work Consult for [place] Planning/Counseling Complete Complete  ?Palliative Care Screening Not Applicable   ?Medication Review Press photographer) Complete Complete  ? ? ?

## 2021-07-17 DIAGNOSIS — S72002A Fracture of unspecified part of neck of left femur, initial encounter for closed fracture: Secondary | ICD-10-CM | POA: Diagnosis not present

## 2021-07-17 DIAGNOSIS — R262 Difficulty in walking, not elsewhere classified: Secondary | ICD-10-CM | POA: Diagnosis not present

## 2021-07-17 DIAGNOSIS — Z993 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: Secondary | ICD-10-CM | POA: Diagnosis not present

## 2021-07-17 DIAGNOSIS — I10 Essential (primary) hypertension: Secondary | ICD-10-CM | POA: Diagnosis not present

## 2021-07-17 LAB — COMPREHENSIVE METABOLIC PANEL
ALT: 6 U/L (ref 0–44)
AST: 15 U/L (ref 15–41)
Albumin: 2.3 g/dL — ABNORMAL LOW (ref 3.5–5.0)
Alkaline Phosphatase: 55 U/L (ref 38–126)
Anion gap: 6 (ref 5–15)
BUN: 18 mg/dL (ref 8–23)
CO2: 27 mmol/L (ref 22–32)
Calcium: 8.1 mg/dL — ABNORMAL LOW (ref 8.9–10.3)
Chloride: 101 mmol/L (ref 98–111)
Creatinine, Ser: 1.11 mg/dL — ABNORMAL HIGH (ref 0.44–1.00)
GFR, Estimated: 49 mL/min — ABNORMAL LOW (ref >60)
Glucose, Bld: 103 mg/dL — ABNORMAL HIGH (ref 70–99)
Potassium: 3.9 mmol/L (ref 3.5–5.1)
Sodium: 134 mmol/L — ABNORMAL LOW (ref 135–145)
Total Bilirubin: 0.5 mg/dL (ref 0.3–1.2)
Total Protein: 4.7 g/dL — ABNORMAL LOW (ref 6.5–8.1)

## 2021-07-17 LAB — PHOSPHORUS: Phosphorus: 2.6 mg/dL (ref 2.5–4.6)

## 2021-07-17 LAB — CBC WITH DIFFERENTIAL/PLATELET
Abs Immature Granulocytes: 0.07 10*3/uL (ref 0.00–0.07)
Basophils Absolute: 0 10*3/uL (ref 0.0–0.1)
Basophils Relative: 0 %
Eosinophils Absolute: 0.3 10*3/uL (ref 0.0–0.5)
Eosinophils Relative: 3 %
HCT: 25.8 % — ABNORMAL LOW (ref 36.0–46.0)
Hemoglobin: 8.2 g/dL — ABNORMAL LOW (ref 12.0–15.0)
Immature Granulocytes: 1 %
Lymphocytes Relative: 22 %
Lymphs Abs: 2.3 10*3/uL (ref 0.7–4.0)
MCH: 30.4 pg (ref 26.0–34.0)
MCHC: 31.8 g/dL (ref 30.0–36.0)
MCV: 95.6 fL (ref 80.0–100.0)
Monocytes Absolute: 1 10*3/uL (ref 0.1–1.0)
Monocytes Relative: 10 %
Neutro Abs: 6.7 10*3/uL (ref 1.7–7.7)
Neutrophils Relative %: 64 %
Platelets: 183 10*3/uL (ref 150–400)
RBC: 2.7 MIL/uL — ABNORMAL LOW (ref 3.87–5.11)
RDW: 15.5 % (ref 11.5–15.5)
WBC: 10.5 10*3/uL (ref 4.0–10.5)
nRBC: 0 % (ref 0.0–0.2)

## 2021-07-17 LAB — MAGNESIUM: Magnesium: 1.8 mg/dL (ref 1.7–2.4)

## 2021-07-17 MED ORDER — POLYETHYLENE GLYCOL 3350 17 G PO PACK
17.0000 g | PACK | Freq: Every day | ORAL | Status: DC
  Administered 2021-07-17 – 2021-07-25 (×6): 17 g via ORAL
  Filled 2021-07-17 (×9): qty 1

## 2021-07-17 MED ORDER — POLYSACCHARIDE IRON COMPLEX 150 MG PO CAPS
150.0000 mg | ORAL_CAPSULE | Freq: Every day | ORAL | Status: DC
Start: 2021-07-17 — End: 2021-07-27
  Administered 2021-07-17 – 2021-07-27 (×11): 150 mg via ORAL
  Filled 2021-07-17 (×11): qty 1

## 2021-07-17 MED ORDER — MAGNESIUM SULFATE 2 GM/50ML IV SOLN
2.0000 g | Freq: Once | INTRAVENOUS | Status: AC
  Administered 2021-07-17: 2 g via INTRAVENOUS
  Filled 2021-07-17: qty 50

## 2021-07-17 NOTE — Progress Notes (Signed)
?PROGRESS NOTE ? ? ? ?Christina Fry  EUM:[id number] DOB: [birth year] DOA: 07/14/2021 ?PCP: Ferdie Ping, MD  ? ?Brief Narrative:  ?HPI per Dr. Phillips Climes on 07/14/21 ? Christina Fry  is a 81 y.o. female, with past medical history of polymyalgia rheumatica, diabetes, asthma, Graves' disease, prior PE on Eliquis, patient presents to ED secondary to fall, patient report fall happened last time, she is mostly wheelchair bound, transfers between chair/bed and wheelchair at her independent living facility, reports last night she was trying to go from wheelchair to chair, she fell back striking her head into the hard floor, as well as her left leg, she was unable to stand up, or to call for help, and laid on the floor from 8 last night until this morning when she was found by staff, and reports severe headache from where she hit her head on the floor, neck pain, left hip pain, left knee pain, she was given fentanyl by EMS, she denies any focal deficits, no fever, no chills,. ?-In ED no significant lab abnormalities, CT head with no evidence of acute findings, findings significant for left hip fracture, ED discussed with orthopedic Dr. Doreatha Martin, who reports plan for surgery today, last dose of Eliquis was yesterday morning. ? ?**Interim History ?Underwent surgical intervention and is doing well postoperatively.  WBC is slightly went up and she does have an acute blood loss anemia as expected.  Need to continue monitor for signs of the bleeding.  PT OT recommending SNF.  ? ?Assessment and Plan: ? ?Left Hip Fracture status post cephalomedullary nailing of the left intertrochanteric femur fracture postoperative day 3 ?-Due to mechanical fall, orthopedic input greatly appreciated, plan for surgical repair today, she is admitted under hip fracture pathway. ?-Was npo for Surgery and now on a Diet  ?-Eliquis now resumed ?-We will consult PT/OT, and TOC as likely will need SNF or rehab placement ?-Patient and daughter understand  risks of surgery, she is mild to moderate risk, and they are agreeable to proceed ?-Patient reports she is DNR, but she understands it will be temporary suspended and she will be full code during procedure . ?-Continue with Pain Control as delineated per Ortho Recc's; she continues to have some pain and pain was little bit better controlled today than it was yesterday ?-Surgery recommends weightbearing as tolerated on left lower extremity and recommending okay for hip and knee range of motion as tolerated.  They are recommending to remove dressings on 5 5 and leave open to air with dry gauze as needed and okay to begin showering on 07/17/2021. ?-Bowel Regimen initiated and now on Docusate 100 mg po BID and Miralax 17 g po Daily   ?-PT and OT to evaluate and treat and they are recommending SNF short-term as she lives at [facility]. ?-Orthopedic surgery recommending oxycodone for as needed pain control at discharge and following up 2 weeks after discharge for wound check and repeat x-rays ?  ?Hyperlipidemia ?-Continue with Atorvastatin 20 mg po daily  ?  ?ABLA as a cause of Post-Operative Anemia ?-Patient's Hgb/Hct went from 14.6/43.0 -> 12.9/41.6 -> 10.4/32.8 -> 8.8/27.4 -> 8.2/25.8 ?-Could have had a Dilutional Component as well ?-Check Anemia Panel and showed an iron level of 16, U IBC of 233, TIBC of 249, saturation ratio of 6%, ferritin level 35, folate level 10.6, vitamin B 12 level 434 ?-Initiate Iron Supplementation with Niferex 150 mg po Daily  ?-Continue to Monitor for S/Sx of Bleeding; No Overt Bleeding  noted ?-Repeat CBC in the AM  ?  ?CKD Stage 3a ?-Patient's BUN/Cr went from 15/1.00 -> 11/1.05 -> 14/1.04 -> 17/1.15 -> 18/1.11 ?-Avoid further nephrotoxic medications, contrast dyes, hypotension and and dehydration to ensure adequate renal perfusion and will need to renally adjust medications ?-Repeat CMP in the AM  ?  ?History of Pulmonary Embolism ?-Per Orthopedic Surgery ok to resume Apixaban and  now back on 2.5 mg BID  ?  ?Hypophosphatemia ?-Patient's Phos Level was 2.4 and improved to 2.6 ?-Replete with po K Phos Neutral 500 mg x1 yesterday  ?-Continue to Monitor and Replete as Necessary ?-Repeat Phos Level in the AM  ?  ?Ambulatory dysfunction/dependent on wheelchair ?-We will consult PT/OT, likely will need SNF placement ?  ?Hyponatremia ?-Patient's sodium went from 140 -> 131 x2 -> 134 ?-We have discontinued her LR now ?-Continue monitor and trend and repeat CMP in a.m. ? ?Leukocytosis ?-Likely reactive in the setting of Fall and Surgery and was improving and now slightly worsened ?-WBC went from 18.2 -> 13.1 -> 14.6 -> 10.5 ?-Continue to Monitor and Trend ?-Repeat CBC in the AM  ?  ?Hypothyroidism ?-Continue with Levothyroxine 100 mcg po Daily  ?  ?Hypertension ?-Continue with home medications, will continue with Metoprolol Succinate 50 mg po Daily  ?-Continue to Monitor BP per Protocol ?-Last BP reading was 102/43 ? ?Hypoalbuminemia ?-Patient's Albumin Level went from 3.4 -> 2.3 x2 ?-Continue to Monitor and Trend ?-Repeat CMP in the AM  ?  ?GERD/GI Prophylaxis ?-Continue with PPI with Pantoprazole 40 mg po Daily  ?  ?Obesity ?-Complicates overall prognosis and care ?-Estimated body mass index is 31.01 kg/m? as calculated from the following: ?  Height as of this encounter: '5\' 3"'$  (1.6 m). ?  Weight as of this encounter: 79.4 kg.  ?-Weight Loss and Dietary Counseling given ? ?DVT prophylaxis: apixaban (ELIQUIS) tablet 2.5 mg Start: 07/15/21 1000 ?SCDs Start: 07/14/21 1553 ?apixaban (ELIQUIS) tablet 2.5 mg  ?  Code Status: DNR ?Family Communication: No family present at bedside  ? ?Disposition Plan:  ?Level of care: Med-Surg ?Status is: Inpatient ?Remains inpatient appropriate because: Needs SNF and Insurance Authorization ?  ?Consultants:  ?Orthopedic Surgery ? ?Procedures:  ?CEPHALOMEDULLARY NAILING LEFT INTERTROCHANTERIC FEMUR FRACTURE POD 3 ? ?Antimicrobials:  ?Anti-infectives (From admission, onward)   ? ? Start     Dose/Rate Route Frequency Ordered Stop  ? 07/15/21 1400  acyclovir (ZOVIRAX) 200 MG capsule 200 mg       ? 200 mg Oral 2 times daily 07/15/21 1309    ? 07/14/21 2000  ceFAZolin (ANCEF) IVPB 1 g/50 mL premix       ? 1 g ?100 mL/hr over 30 Minutes Intravenous Every 8 hours 07/14/21 1552 07/15/21 1407  ? 07/14/21 1200  ceFAZolin (ANCEF) IVPB 2g/100 mL premix       ? 2 g ?200 mL/hr over 30 Minutes Intravenous On call to O.R. 07/14/21 1155 07/14/21 1255  ? ?  ?  ?Subjective: ?Seen and examined at bedside and thinks she is doing okay.  States that she has not had bowel movement and states that she takes MiraLAX daily so this has been changed for her.  Denies any chest pain or shortness of breath.  Denies any lightheadedness or dizziness.  No other concerns or complaints this time. ? ?Objective: ?Vitals:  ? 07/16/21 1501 07/16/21 2001 07/17/21 0442 07/17/21 0733  ?BP: (!) 104/54 133/60 (!) 106/45 (!) 102/43  ?Pulse: 85 85 86 83  ?Resp: 11  14 14   ?Temp: 98.4 ?F (36.9 ?C) 97.8 ?F (36.6 ?C) 98.5 ?F (36.9 ?C) 97.9 ?F (36.6 ?C)  ?TempSrc: Oral   Oral  ?SpO2: 94% 93% (!) 89% 93%  ?Weight:      ?Height:      ? ? ?Intake/Output Summary (Last 24 hours) at 07/17/2021 1006 ?Last data filed at 07/16/2021 1311 ?Gross per 24 hour  ?Intake 480 ml  ?Output 300 ml  ?Net 180 ml  ? ?Filed Weights  ? 07/14/21 4132 07/14/21 1232  ?Weight: 79.4 kg 79.4 kg  ? ?Examination: ?Physical Exam: ? ?Constitutional: WN/WD obese elderly Caucasian female currently no acute distress ?Respiratory: Diminished to auscultation bilaterally with coarse breath sounds, no wheezing, rales, rhonchi or crackles. Normal respiratory effort and patient is not tachypenic. No accessory muscle use.  Unlabored breathing ?Cardiovascular: RRR, no murmurs / rubs / gallops. S1 and S2 auscultated. No extremity edema.  ?Abdomen: Soft, non-tender, distended secondary body habitus.  Bowel sounds positive.  ?GU: Deferred. ?Musculoskeletal: No clubbing / cyanosis of  digits/nails. No joint deformity upper and lower extremities. ?Neurologic: CN 2-12 grossly intact with no focal deficits.  ?Psychiatric: Normal judgment and insight. Alert and oriented x 3. Normal mood and app

## 2021-07-18 DIAGNOSIS — S72002A Fracture of unspecified part of neck of left femur, initial encounter for closed fracture: Secondary | ICD-10-CM | POA: Diagnosis not present

## 2021-07-18 DIAGNOSIS — I10 Essential (primary) hypertension: Secondary | ICD-10-CM | POA: Diagnosis not present

## 2021-07-18 DIAGNOSIS — Z993 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: Secondary | ICD-10-CM | POA: Diagnosis not present

## 2021-07-18 DIAGNOSIS — R262 Difficulty in walking, not elsewhere classified: Secondary | ICD-10-CM | POA: Diagnosis not present

## 2021-07-18 LAB — COMPREHENSIVE METABOLIC PANEL
ALT: 11 U/L (ref 0–44)
AST: 21 U/L (ref 15–41)
Albumin: 2.6 g/dL — ABNORMAL LOW (ref 3.5–5.0)
Alkaline Phosphatase: 70 U/L (ref 38–126)
Anion gap: 5 (ref 5–15)
BUN: 20 mg/dL (ref 8–23)
CO2: 29 mmol/L (ref 22–32)
Calcium: 8.7 mg/dL — ABNORMAL LOW (ref 8.9–10.3)
Chloride: 102 mmol/L (ref 98–111)
Creatinine, Ser: 1.09 mg/dL — ABNORMAL HIGH (ref 0.44–1.00)
GFR, Estimated: 50 mL/min — ABNORMAL LOW (ref >60)
Glucose, Bld: 142 mg/dL — ABNORMAL HIGH (ref 70–99)
Potassium: 4.4 mmol/L (ref 3.5–5.1)
Sodium: 136 mmol/L (ref 135–145)
Total Bilirubin: 0.5 mg/dL (ref 0.3–1.2)
Total Protein: 5.7 g/dL — ABNORMAL LOW (ref 6.5–8.1)

## 2021-07-18 LAB — CBC WITH DIFFERENTIAL/PLATELET
Abs Immature Granulocytes: 0.06 10*3/uL (ref 0.00–0.07)
Basophils Absolute: 0.1 10*3/uL (ref 0.0–0.1)
Basophils Relative: 1 %
Eosinophils Absolute: 0.4 10*3/uL (ref 0.0–0.5)
Eosinophils Relative: 4 %
HCT: 29.3 % — ABNORMAL LOW (ref 36.0–46.0)
Hemoglobin: 9.2 g/dL — ABNORMAL LOW (ref 12.0–15.0)
Immature Granulocytes: 1 %
Lymphocytes Relative: 25 %
Lymphs Abs: 2.7 10*3/uL (ref 0.7–4.0)
MCH: 30.5 pg (ref 26.0–34.0)
MCHC: 31.4 g/dL (ref 30.0–36.0)
MCV: 97 fL (ref 80.0–100.0)
Monocytes Absolute: 1 10*3/uL (ref 0.1–1.0)
Monocytes Relative: 9 %
Neutro Abs: 6.5 10*3/uL (ref 1.7–7.7)
Neutrophils Relative %: 60 %
Platelets: 243 10*3/uL (ref 150–400)
RBC: 3.02 MIL/uL — ABNORMAL LOW (ref 3.87–5.11)
RDW: 15.9 % — ABNORMAL HIGH (ref 11.5–15.5)
WBC: 10.7 10*3/uL — ABNORMAL HIGH (ref 4.0–10.5)
nRBC: 0 % (ref 0.0–0.2)

## 2021-07-18 LAB — PHOSPHORUS: Phosphorus: 3.2 mg/dL (ref 2.5–4.6)

## 2021-07-18 LAB — MAGNESIUM: Magnesium: 2.3 mg/dL (ref 1.7–2.4)

## 2021-07-18 NOTE — Plan of Care (Signed)

## 2021-07-18 NOTE — Progress Notes (Addendum)
?PROGRESS NOTE ? ? ? ?Christina Fry  VHQ:[id number] DOB: 12-Sep-1940 DOA: 07/14/2021 ?PCP: Ferdie Ping, MD  ? ?Brief Narrative:  ?HPI per Dr. Phillips Climes on 07/14/21 ? Christina Fry  is a 81 y.o. female, with past medical history of polymyalgia rheumatica, diabetes, asthma, Graves' disease, prior PE on Eliquis, patient presents to ED secondary to fall, patient report fall happened last time, she is mostly wheelchair bound, transfers between chair/bed and wheelchair at her independent living facility, reports last night she was trying to go from wheelchair to chair, she fell back striking her head into the hard floor, as well as her left leg, she was unable to stand up, or to call for help, and laid on the floor from 8 last night until this morning when she was found by staff, and reports severe headache from where she hit her head on the floor, neck pain, left hip pain, left knee pain, she was given fentanyl by EMS, she denies any focal deficits, no fever, no chills,. ?-In ED no significant lab abnormalities, CT head with no evidence of acute findings, findings significant for left hip fracture, ED discussed with orthopedic Dr. Doreatha Martin, who reports plan for surgery today, last dose of Eliquis was yesterday morning. ? ?**Interim History ?Underwent surgical intervention and is doing well postoperatively.  WBC is slightly went up but trended back down and is now stable and she does have an acute blood loss anemia as expected but Hgb/Hct is now improved today.  Need to continue monitor for signs of the bleeding.  PT OT recommending SNF and from a medical standpoint she is stable to D/C to SNF with continued Therapy.   ? ?Assessment and Plan: ? ?Left Hip Fracture status post cephalomedullary nailing of the left intertrochanteric femur fracture postoperative day 4 ?-Due to mechanical fall, orthopedic input greatly appreciated, plan for surgical repair today, she is admitted under hip fracture pathway. ?-Was npo for Surgery  and now on a Diet  ?-Eliquis now resumed ?-We will consult PT/OT, and TOC as likely will need SNF or rehab placement ?-Patient and daughter understand risks of surgery, she is mild to moderate risk, and they are agreeable to proceed ?-Patient reports she is DNR, but she understands it will be temporary suspended and she will be full code during procedure . ?-Continue with Pain Control as delineated per Ortho Recc's; she continues to have some pain and pain was little bit better controlled today than it was yesterday ?-Surgery recommends weightbearing as tolerated on left lower extremity and recommending okay for hip and knee range of motion as tolerated.  They are recommending to remove dressings on 5 5 and leave open to air with dry gauze as needed and okay to begin showering on 07/17/2021. ?-Bowel Regimen initiated and now on Docusate 100 mg po BID and Miralax 17 g po Daily and she had a bowel movement  ?-PT and OT to evaluate and treat and they are recommending SNF short-term as she lives at [facility]. TOC assisting with D/C Disposition ?-Orthopedic surgery recommending oxycodone for as needed pain control at discharge and following up 2 weeks after discharge for wound check and repeat x-rays ?  ?Hyperlipidemia ?-Continue with Atorvastatin 20 mg po daily  ?  ?ABLA as a cause of Post-Operative Anemia ?-Patient's Hgb/Hct went from 14.6/43.0 -> 12.9/41.6 -> 10.4/32.8 -> 8.8/27.4 -> 8.2/25.8 -> 9.2/29.3 ?-Could have had a Dilutional Component as well ?-Check Anemia Panel and showed an iron level of 16,  U IBC of 233, TIBC of 249, saturation ratio of 6%, ferritin level 35, folate level 10.6, vitamin B 12 level 434 ?-Initiate Iron Supplementation with Niferex 150 mg po Daily  ?-Continue to Monitor for S/Sx of Bleeding; No Overt Bleeding noted ?-Repeat CBC in the AM  ?  ?CKD Stage 3a ?-Patient's BUN/Cr went from 15/1.00 -> 11/1.05 -> 14/1.04 -> 17/1.15 -> 18/1.11 -> 20/1.09 ?-Avoid further nephrotoxic  medications, contrast dyes, hypotension and and dehydration to ensure adequate renal perfusion and will need to renally adjust medications ?-Repeat CMP in the AM  ?  ?History of Pulmonary Embolism ?-Per Orthopedic Surgery ok to resume Apixaban and now back on 2.5 mg BID  ?  ?Hypophosphatemia ?-Patient's Phos Level was 2.4 and improved to 2.6 ?-Replete with po K Phos Neutral 500 mg x1 yesterday  ?-Continue to Monitor and Replete as Necessary ?-Repeat Phos Level in the AM  ?  ?Ambulatory dysfunction/dependent on wheelchair ?-We will consult PT/OT, likely will need SNF placement and is being worked up and [place] is being discussed with patient and the Daughter. VA approval needed and TOC assisting with D/C disposition  ?  ?Hyponatremia ?-Patient's sodium went from 140 -> 131 x2 -> 134 -> 136 ?-We have discontinued her LR now ?-Continue monitor and trend and repeat CMP in a.m. ? ?Leukocytosis, mild ?-Likely reactive in the setting of Fall and Surgery and was improving and now slightly worsened ?-WBC went from 18.2 -> 13.1 -> 14.6 -> 10.5 -> 10.7 and mildly elevated from yesterday but stable  ?-Continue to Monitor and Trend ?-Repeat CBC in the AM  ?  ?Hypothyroidism ?-Continue with Levothyroxine 100 mcg po Daily  ?  ?Hypertension ?-Continue with home medications, will continue with Metoprolol Succinate 50 mg po Daily  ?-Continue to Monitor BP per Protocol ?-Last BP reading was 133/48 ?  ?Hypoalbuminemia ?-Patient's Albumin Level went from 3.4 -> 2.3 x2 -> 2.6 ?-Continue to Monitor and Trend ?-Repeat CMP in the AM  ?  ?GERD/GI Prophylaxis ?-Continue with PPI with Pantoprazole 40 mg po Daily  ?  ?Obesity ?-Complicates overall prognosis and care ?-Estimated body mass index is 31.01 kg/m? as calculated from the following: ?  Height as of this encounter: '5\' 3"'$  (1.6 m). ?  Weight as of this encounter: 79.4 kg.  ?-Weight Loss and Dietary Counseling given ? ?DVT prophylaxis: apixaban (ELIQUIS) tablet 2.5 mg Start: 07/15/21  1000 ?SCDs Start: 07/14/21 1553 ?apixaban (ELIQUIS) tablet 2.5 mg  ?  Code Status: DNR ?Family Communication: No family present at bedside ? ?Disposition Plan:  ?Level of care: Med-Surg ?Status is: Inpatient ?Remains inpatient appropriate because: Needs SNF. From a Medical Standpoint she is much improved  ?  ?Consultants:  ?Orthopedic Surgery ? ?Procedures:  ?CEPHALOMEDULLARY NAILING LEFT INTERTROCHANTERIC FEMUR FRACTURE POD 4 ? ?Antimicrobials:  ?Anti-infectives (From admission, onward)  ? ? Start     Dose/Rate Route Frequency Ordered Stop  ? 07/15/21 1400  acyclovir (ZOVIRAX) 200 MG capsule 200 mg       ? 200 mg Oral 2 times daily 07/15/21 1309    ? 07/14/21 2000  ceFAZolin (ANCEF) IVPB 1 g/50 mL premix       ? 1 g ?100 mL/hr over 30 Minutes Intravenous Every 8 hours 07/14/21 1552 07/15/21 1407  ? 07/14/21 1200  ceFAZolin (ANCEF) IVPB 2g/100 mL premix       ? 2 g ?200 mL/hr over 30 Minutes Intravenous On call to O.R. 07/14/21 1155 07/14/21 1255  ? ?  ?  ?  Subjective: ?Seen and examined at bedside and states she had a bowel movement at 330 this AM. No CP or SOB. Rates her pain an 8/10 and states it is worse with movement. States she was sleeping when breakfast was delivered. No other concerns or complaints at this time and understands she will need to go to SNF before going back to ILF at [facility].  ? ?Objective: ?Vitals:  ? 07/17/21 1836 07/17/21 2028 07/18/21 0547 07/18/21 0717  ?BP:  (!) 115/53 (!) 117/45 (!) 133/48  ?Pulse:  93 92 88  ?Resp: '15 16 17   '$ ?Temp:  98.1 ?F (36.7 ?C) 98.2 ?F (36.8 ?C) 98.2 ?F (36.8 ?C)  ?TempSrc:   Oral Oral  ?SpO2:  91% 100% 96%  ?Weight:      ?Height:      ? ? ?Intake/Output Summary (Last 24 hours) at 07/18/2021 1000 ?Last data filed at 07/17/2021 1849 ?Gross per 24 hour  ?Intake 240 ml  ?Output 400 ml  ?Net -160 ml  ? ?Filed Weights  ? 07/14/21 5859 07/14/21 1232  ?Weight: 79.4 kg 79.4 kg  ? ?Examination: ?Physical Exam: ? ?Constitutional: WN/WD obese Caucasian female in  NAD ?Respiratory: Diminished to auscultation bilaterally with coarse breath sounds, no wheezing, rales, rhonchi or crackles. Normal respiratory effort and patient is not tachypenic. No accessory muscle use. Unlabored

## 2021-07-19 DIAGNOSIS — Z993 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: Secondary | ICD-10-CM | POA: Diagnosis not present

## 2021-07-19 DIAGNOSIS — I10 Essential (primary) hypertension: Secondary | ICD-10-CM | POA: Diagnosis not present

## 2021-07-19 DIAGNOSIS — S72002A Fracture of unspecified part of neck of left femur, initial encounter for closed fracture: Secondary | ICD-10-CM | POA: Diagnosis not present

## 2021-07-19 DIAGNOSIS — R262 Difficulty in walking, not elsewhere classified: Secondary | ICD-10-CM | POA: Diagnosis not present

## 2021-07-19 LAB — COMPREHENSIVE METABOLIC PANEL
ALT: 12 U/L (ref 0–44)
AST: 20 U/L (ref 15–41)
Albumin: 2.3 g/dL — ABNORMAL LOW (ref 3.5–5.0)
Alkaline Phosphatase: 65 U/L (ref 38–126)
Anion gap: 5 (ref 5–15)
BUN: 18 mg/dL (ref 8–23)
CO2: 29 mmol/L (ref 22–32)
Calcium: 8.7 mg/dL — ABNORMAL LOW (ref 8.9–10.3)
Chloride: 101 mmol/L (ref 98–111)
Creatinine, Ser: 1.02 mg/dL — ABNORMAL HIGH (ref 0.44–1.00)
GFR, Estimated: 55 mL/min — ABNORMAL LOW (ref >60)
Glucose, Bld: 150 mg/dL — ABNORMAL HIGH (ref 70–99)
Potassium: 4.3 mmol/L (ref 3.5–5.1)
Sodium: 135 mmol/L (ref 135–145)
Total Bilirubin: 0.6 mg/dL (ref 0.3–1.2)
Total Protein: 5.1 g/dL — ABNORMAL LOW (ref 6.5–8.1)

## 2021-07-19 LAB — CBC WITH DIFFERENTIAL/PLATELET
Abs Immature Granulocytes: 0.06 10*3/uL (ref 0.00–0.07)
Basophils Absolute: 0.1 10*3/uL (ref 0.0–0.1)
Basophils Relative: 1 %
Eosinophils Absolute: 0.4 10*3/uL (ref 0.0–0.5)
Eosinophils Relative: 3 %
HCT: 27.3 % — ABNORMAL LOW (ref 36.0–46.0)
Hemoglobin: 8.4 g/dL — ABNORMAL LOW (ref 12.0–15.0)
Immature Granulocytes: 1 %
Lymphocytes Relative: 22 %
Lymphs Abs: 2.3 10*3/uL (ref 0.7–4.0)
MCH: 30.1 pg (ref 26.0–34.0)
MCHC: 30.8 g/dL (ref 30.0–36.0)
MCV: 97.8 fL (ref 80.0–100.0)
Monocytes Absolute: 1.1 10*3/uL — ABNORMAL HIGH (ref 0.1–1.0)
Monocytes Relative: 10 %
Neutro Abs: 6.7 10*3/uL (ref 1.7–7.7)
Neutrophils Relative %: 63 %
Platelets: 257 10*3/uL (ref 150–400)
RBC: 2.79 MIL/uL — ABNORMAL LOW (ref 3.87–5.11)
RDW: 15.5 % (ref 11.5–15.5)
WBC: 10.5 10*3/uL (ref 4.0–10.5)
nRBC: 0 % (ref 0.0–0.2)

## 2021-07-19 LAB — PHOSPHORUS: Phosphorus: 3.5 mg/dL (ref 2.5–4.6)

## 2021-07-19 LAB — MAGNESIUM: Magnesium: 1.9 mg/dL (ref 1.7–2.4)

## 2021-07-19 MED ORDER — POLYSACCHARIDE IRON COMPLEX 150 MG PO CAPS
150.0000 mg | ORAL_CAPSULE | Freq: Every day | ORAL | Status: AC
Start: 2021-07-19 — End: ?

## 2021-07-19 MED ORDER — ADULT MULTIVITAMIN W/MINERALS CH: 1.0000 | ORAL_TABLET | Freq: Every day | ORAL | Status: AC

## 2021-07-19 MED ORDER — DOCUSATE SODIUM 100 MG PO CAPS: 100.0000 mg | ORAL_CAPSULE | Freq: Two times a day (BID) | ORAL | 0 refills | Status: AC

## 2021-07-19 MED ORDER — ENSURE ENLIVE PO LIQD: 237.0000 mL | Freq: Two times a day (BID) | ORAL | 12 refills | Status: AC

## 2021-07-19 MED ORDER — POLYVINYL ALCOHOL 1.4 % OP SOLN: 1.0000 [drp] | Freq: Three times a day (TID) | OPHTHALMIC | 0 refills | Status: AC | PRN

## 2021-07-19 MED ORDER — POLYETHYLENE GLYCOL 3350 17 G PO PACK: 17.0000 g | PACK | Freq: Every day | ORAL | 0 refills | Status: AC

## 2021-07-19 MED ORDER — ONDANSETRON HCL 4 MG PO TABS: 4.0000 mg | ORAL_TABLET | Freq: Four times a day (QID) | ORAL | 0 refills | Status: AC | PRN

## 2021-07-19 MED ORDER — ACETAMINOPHEN 325 MG PO TABS: 650.0000 mg | ORAL_TABLET | Freq: Four times a day (QID) | ORAL | Status: AC

## 2021-07-19 NOTE — Discharge Summary (Incomplete)
?Physician Discharge Summary ?  ?Patient: Christina Fry MRN: [id number] DOB: 10/29/1940  ?Admit date:     07/14/2021  ?Discharge date: 07/19/21  ?Discharge Physician: Raiford Noble, DO  ? ?PCP: Ferdie Ping, MD  ? ?Recommendations at discharge:  ? ?Follow up with PCP within 1-2 weeks and repeat CBC, CMP, Mag, Phos within 1 week ?Follow up with Orthopedic Surgery Dr. Doreatha Martin 2 weeks after Discharge for wound check and repeat X-rays ? ?Discharge Diagnoses: ?Principal Problem: ?  Closed left hip fracture ([place]) ?Active Problems: ?  Mild intermittent asthma without complication ?  Generalized weakness ?  PMR (polymyalgia rheumatica) (HCC) ?  GERD (gastroesophageal reflux disease) ?  Fibromyalgia ?  Essential hypertension ?  Hypothyroidism ?  Ambulatory dysfunction ?  Pulmonary embolism ([place]) ?  T12 compression fracture ([place]) ?  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 ?  Vitamin B12 deficiency ?  Protein-calorie malnutrition, severe ? ?Resolved Problems: ?  * No resolved hospital problems. * ? ?Hospital Course: ?HPI per Dr. Phillips Climes on 07/14/21 ? Christina Fry  is a 81 y.o. female, with past medical history of polymyalgia rheumatica, diabetes, asthma, Graves' disease, prior PE on Eliquis, patient presents to ED secondary to fall, patient report fall happened last time, she is mostly wheelchair bound, transfers between chair/bed and wheelchair at her independent living facility, reports last night she was trying to go from wheelchair to chair, she fell back striking her head into the hard floor, as well as her left leg, she was unable to stand up, or to call for help, and laid on the floor from 8 last night until this morning when she was found by staff, and reports severe headache from where she hit her head on the floor, neck pain, left hip pain, left knee pain, she was given fentanyl by EMS, she denies any focal deficits, no fever, no chills,. ?-In ED no significant lab abnormalities, CT head with no evidence of acute findings,  findings significant for left hip fracture, ED discussed with orthopedic Dr. Doreatha Martin, who reports plan for surgery today, last dose of Eliquis was yesterday morning. ? ?**Interim History ?Underwent surgical intervention and is doing well postoperatively.  WBC is slightly went up but trended back down and is now stable and she does have an acute blood loss anemia as expected but Hgb/Hct is now improved today.  Need to continue monitor for signs of the bleeding.  PT OT recommending SNF and from a medical standpoint she is stable to D/C to SNF with continued Therapy.  ? ?Assessment and Plan: ? ?Left Hip Fracture status post cephalomedullary nailing of the left intertrochanteric femur fracture postoperative day 5 ?-Due to mechanical fall, orthopedic input greatly appreciated, plan for surgical repair today, she is admitted under hip fracture pathway. ?-Was npo for Surgery and now on a Diet  ?-Eliquis now resumed ?-We will consult PT/OT, and TOC as likely will need SNF or rehab placement ?-Patient and daughter understand risks of surgery, she is mild to moderate risk, and they are agreeable to proceed ?-Patient reports she is DNR, but she understands it will be temporary suspended and she will be full code during procedure . ?-Continue with Pain Control as delineated per Ortho Recc's; she continues to have some pain and pain was little bit better controlled today than it was yesterday ?-Surgery recommends weightbearing as tolerated on left lower extremity and recommending okay for hip and knee range of motion as tolerated.  They are recommending to remove dressings on 5  5 and leave open to air with dry gauze as needed and okay to begin showering on 07/17/2021. ?-Bowel Regimen initiated and now on Docusate 100 mg po BID and Miralax 17 g po Daily and she had a bowel movement  ?-PT and OT to evaluate and treat and they are recommending SNF short-term as she lives at [facility]. TOC assisting with D/C  Disposition ?-Orthopedic surgery recommending oxycodone for as needed pain control at discharge and following up 2 weeks after discharge for wound check and repeat x-rays ?  ?Hyperlipidemia ?-Continue with Atorvastatin 20 mg po daily  ?  ?ABLA as a cause of Post-Operative Anemia ?-Patient's Hgb/Hct went from 14.6/43.0 -> 12.9/41.6 -> 10.4/32.8 -> 8.8/27.4 -> 8.2/25.8 -> 9.2/29.3 -> 8.4/27.3 ?-Could have had a Dilutional Component as well ?-Check Anemia Panel and showed an iron level of 16, U IBC of 233, TIBC of 249, saturation ratio of 6%, ferritin level 35, folate level 10.6, vitamin B 12 level 434 ?-Initiate Iron Supplementation with Niferex 150 mg po Daily  ?-Continue to Monitor for S/Sx of Bleeding; No Overt Bleeding noted ?-Repeat CBC in the AM  ?  ?CKD Stage 3a ?-Patient's BUN/Cr went from 15/1.00 -> 11/1.05 -> 14/1.04 -> 17/1.15 -> 18/1.11 -> 20/1.09 -> 18/1.02 ?-Avoid further nephrotoxic medications, contrast dyes, hypotension and and dehydration to ensure adequate renal perfusion and will need to renally adjust medications ?-Repeat CMP in the AM  ?  ?History of Pulmonary Embolism ?-Per Orthopedic Surgery ok to resume Apixaban and now back on 2.5 mg BID  ?  ?Hypophosphatemia ?-Patient's Phos Level  is now 3.5 ?-Continue to Monitor and Replete as Necessary ?-Repeat Phos Level in the AM  ?  ?Ambulatory dysfunction/dependent on wheelchair ?-We will consult PT/OT, likely will need SNF placement and is being worked up and [place] is being discussed with patient and the Daughter. VA approval needed and TOC assisting with D/C disposition  ?  ?Hyponatremia ?-Patient's sodium went from 140 -> 131 x2 -> 134 -> 136 -> 135 ?-We have discontinued her LR now ?-Continue monitor and trend and repeat CMP in a.m. ? ?Leukocytosis, mild ?-Likely reactive in the setting of Fall and Surgery and was improving and now slightly worsened ?-WBC went from 18.2 -> 13.1 -> 14.6 -> 10.5 -> 10.7 -> 10.5 but stable  ?-Continue to  Monitor and Trend ?-Repeat CBC in the AM  ?  ?Hypothyroidism ?-Continue with Levothyroxine 100 mcg po Daily  ?  ?Hypertension ?-Continue with home medications, will continue with Metoprolol Succinate 50 mg po Daily  ?-Continue to Monitor BP per Protocol ?-Last BP reading was 115/55 ?  ?Hypoalbuminemia ?-Patient's Albumin Level went from 3.4 -> 2.3 x2 -> 2.6 -> 2.3 ?-Continue to Monitor and Trend ?-Repeat CMP in the AM  ?  ?GERD/GI Prophylaxis ?-Continue with PPI with Pantoprazole 40 mg po Daily  ?  ?Obesity ?-Complicates overall prognosis and care ?-Estimated body mass index is 31.01 kg/m? as calculated from the following: ?  Height as of this encounter: '5\' 3"'$  (1.6 m). ?  Weight as of this encounter: 79.4 kg.  ?-Weight Loss and Dietary Counseling given ? ?Nutrition Documentation   ? ?Flowsheet Row ED to Hosp-Admission (Current) from 07/14/2021 in [place]  ?Nutrition Problem Severe Malnutrition  ?Etiology acute illness  [recent back surgery]  ?Nutrition Goal Patient will meet greater than or equal to 90% of their needs  ?Interventions Ensure Enlive (each supplement provides 350kcal and 20  grams of protein), MVI  ? ?  ? ?Pain control - [employer] Controlled Substance Reporting System database was reviewed. and patient was instructed, not to drive, operate heavy machinery, perform activities at heights, swimming or participation in water activities or provide baby-sitting services while on Pain, Sleep and Anxiety Medications; until their outpatient Physician has advised to do so again. Also recommended to not to take more than prescribed Pain, Sleep and Anxiety Medications.  ? ?Consultants: Orthopedic Surgery  ?Procedures performed: CEPHALOMEDULLARY NAILING LEFT INTERTROCHANTERIC FEMUR FRACTURE POD 5  ?Disposition: Skilled nursing facility ?Diet recommendation:  ?Regular diet ? ?DISCHARGE MEDICATION: ?Allergies as of 07/19/2021   ? ?   Reactions  ? Iohexol Shortness Of Breath  ?  Pt stopped breathing at [facility]. Pt refuses IV dye  ? Codeine Itching  ? Fentanyl Other (See Comments)  ? Agitation & confusion  ? Morphine And Related Itching  ? Nitrofuran Derivatives Other (See Comments)  ? Unknown  ? O

## 2021-07-19 NOTE — Progress Notes (Signed)
Physical Therapy Treatment ?Patient Details ?Name: Christina Fry ?MRN: [id number] ?DOB: Jan 29, 1941 ?Today's Date: 07/19/2021 ? ? ?History of Present Illness 81 y.o. female admitted from [facility] on 07/14/21 after fall when trying to go from w/c to chair and striking her head on floor, found down by staff the next morning. Head/cervical CT negative for acute abnormality. Workup for L intertrochanteric femur fx s/p cephalomedullary nailing 5/3. PMH includes DM, fibromyalgia, kyphoplasty (04/2021), HLD, anxiety, cancer. ? ?  ?PT Comments  ? ? Pt making slow, steady progress. Worked on pt moving LLE and being able to weight bear on it in order to improve mobility and transfers.  Continue to feel pt will need ST-SNF at dc.   ?Recommendations for follow up therapy are one component of a multi-disciplinary discharge planning process, led by the attending physician.  Recommendations may be updated based on patient status, additional functional criteria and insurance authorization. ? ?Follow Up Recommendations ? Skilled nursing-short term rehab (<3 hours/day) ?  ?  ?Assistance Recommended at Discharge Frequent or constant Supervision/Assistance  ?Patient can return home with the following Two people to help with walking and/or transfers;A lot of help with bathing/dressing/bathroom ?  ?Equipment Recommendations ? None recommended by PT  ?  ?Recommendations for Other Services   ? ? ?  ?Precautions / Restrictions Precautions ?Precautions: Fall;Other (comment) ?Precaution Comments: urine incontinence/ TLSO brace in room (pt has been wearing brace since kyphoplasty 04/2021 but states she can't put it on now with her hip pain) ?Restrictions ?Weight Bearing Restrictions: Yes ?LLE Weight Bearing: Weight bearing as tolerated  ?  ? ?Mobility ? Bed Mobility ?Overal bed mobility: Needs Assistance ?Bed Mobility: Supine to Sit, Sit to Supine ?  ?  ?Supine to sit: Mod assist, HOB elevated ?Sit to supine: Mod assist ?  ?General bed mobility  comments: Assist to bring LLE off of bed, elevate trunk into sitting and bring hips to EOB. Assist to bring LE's back up into bed returning to supine. ?  ? ?Transfers ?Overall transfer level: Needs assistance ?Equipment used: Ambulation equipment used ?Transfers: Sit to/from Stand ?Sit to Stand: Mod assist ?  ?  ?  ?  ?  ?General transfer comment: Used Stedy to stand x 4 with mod assist to bring hips up. Worked on extending hips, knees, and trunk in standing with manual facilitation. ?  ? ?Ambulation/Gait ?  ?  ?  ?  ?  ?  ?  ?  ? ? ?Stairs ?  ?  ?  ?  ?  ? ? ?Wheelchair Mobility ?  ? ?Modified Rankin (Stroke Patients Only) ?  ? ? ?  ?Balance Overall balance assessment: Needs assistance ?Sitting-balance support: No upper extremity supported, Feet supported ?Sitting balance-Leahy Scale: Fair ?  ?  ?Standing balance support: Bilateral upper extremity supported, During functional activity ?Standing balance-Leahy Scale: Poor ?Standing balance comment: Stood x 4 for 10- 30 sec with min to mod assist to maintain ?  ?  ?  ?  ?  ?  ?  ?  ?  ?  ?  ?  ? ?  ?Cognition Arousal/Alertness: Awake/alert ?Behavior During Therapy: [facility] for tasks assessed/performed ?Overall Cognitive Status: No family/caregiver present to determine baseline cognitive functioning ?  ?  ?  ?  ?  ?  ?  ?  ?  ?  ?  ?Memory: Decreased short-term memory ?  ?  ?  ?  ?  ?  ?  ? ?  ?Exercises  General Exercises - Lower Extremity ?Heel Slides: AAROM, 10 reps, Left, Supine ?Hip ABduction/ADduction: AAROM, Left, 10 reps, Supine ? ?  ?General Comments General comments (skin integrity, edema, etc.): Performed soft tissue massage to lt periscapular area for pain relief. ?  ?  ? ?Pertinent Vitals/Pain Pain Assessment ?Pain Assessment: 0-10 ?Pain Score: 8  ?Pain Location: LLE and lt shoulder ?Pain Descriptors / Indicators: Grimacing, Guarding ?Pain Intervention(s): Limited activity within patient's tolerance, Monitored during session, Heat applied (heat to lt shoulder)   ? ? ?Home Living   ?  ?  ?  ?  ?  ?  ?  ?  ?  ?   ?  ?Prior Function    ?  ?  ?   ? ?PT Goals (current goals can now be found in the care plan section) Acute Rehab PT Goals ?Patient Stated Goal: rehab at SNF before return to [place] ?Progress towards PT goals: Progressing toward goals ? ?  ?Frequency ? ? ? Min 3X/week ? ? ? ?  ?PT Plan Current plan remains appropriate  ? ? ?Co-evaluation   ?  ?  ?  ?  ? ?  ?AM-PAC PT "6 Clicks" Mobility   ?Outcome Measure ? Help needed turning from your back to your side while in a flat bed without using bedrails?: A Lot ?Help needed moving from lying on your back to sitting on the side of a flat bed without using bedrails?: A Lot ?Help needed moving to and from a bed to a chair (including a wheelchair)?: Total ?Help needed standing up from a chair using your arms (e.g., wheelchair or bedside chair)?: A Lot ?Help needed to walk in hospital room?: Total ?Help needed climbing 3-5 steps with a railing? : Total ?6 Click Score: 9 ? ?  ?End of Session Equipment Utilized During Treatment: Gait belt ?Activity Tolerance: Patient tolerated treatment well ?Patient left: in bed;with call bell/phone within reach;with bed alarm set ?  ?PT Visit Diagnosis: Other abnormalities of gait and mobility (R26.89);Muscle weakness (generalized) (M62.81);Pain ?Pain - Right/Left: Left ?Pain - part of body: Leg ?  ? ? ?Time: 8756-4332 ?PT Time Calculation (min) (ACUTE ONLY): 33 min ? ?Charges:  $Therapeutic Activity: 23-37 mins          ?          ? ?[facility] PT ?Acute Rehabilitation Services ?Office [phone] ? ? ? ?Shary Dec[facility] ?07/19/2021, 1:29 PM ? ?

## 2021-07-19 NOTE — Progress Notes (Signed)
?PROGRESS NOTE ? ? ? ?Christina Fry  UUV:[id number] DOB: Nov 05, 1940 DOA: 07/14/2021 ?PCP: Ferdie Ping, MD  ? ?Brief Narrative:  ?HPI per Dr. Phillips Climes on 07/14/21 ? Christina Fry  is a 81 y.o. female, with past medical history of polymyalgia rheumatica, diabetes, asthma, Graves' disease, prior PE on Eliquis, patient presents to ED secondary to fall, patient report fall happened last time, she is mostly wheelchair bound, transfers between chair/bed and wheelchair at her independent living facility, reports last night she was trying to go from wheelchair to chair, she fell back striking her head into the hard floor, as well as her left leg, she was unable to stand up, or to call for help, and laid on the floor from 8 last night until this morning when she was found by staff, and reports severe headache from where she hit her head on the floor, neck pain, left hip pain, left knee pain, she was given fentanyl by EMS, she denies any focal deficits, no fever, no chills,. ?-In ED no significant lab abnormalities, CT head with no evidence of acute findings, findings significant for left hip fracture, ED discussed with orthopedic Dr. Doreatha Martin, who reports plan for surgery today, last dose of Eliquis was yesterday morning. ? ?**Interim History ?Underwent surgical intervention and is doing well postoperatively.  WBC is slightly went up but trended back down and is now stable and she does have an acute blood loss anemia as expected but Hgb/Hct is now improved today.  Need to continue monitor for signs of the bleeding.  PT OT recommending SNF and from a medical standpoint she is stable to D/C to SNF with continued Therapy.   ? ? ?Assessment and Plan: ? ?Left Hip Fracture status post cephalomedullary nailing of the left intertrochanteric femur fracture postoperative day 5 ?-Due to mechanical fall, orthopedic input greatly appreciated, plan for surgical repair today, she is admitted under hip fracture pathway. ?-Was npo for  Surgery and now on a Diet  ?-Eliquis now resumed ?-We will consult PT/OT, and TOC as likely will need SNF or rehab placement ?-Patient and daughter understand risks of surgery, she is mild to moderate risk, and they are agreeable to proceed ?-Patient reports she is DNR, but she understands it will be temporary suspended and she will be full code during procedure . ?-Continue with Pain Control as delineated per Ortho Recc's; she continues to have some pain and pain was little bit better controlled today than it was yesterday ?-Surgery recommends weightbearing as tolerated on left lower extremity and recommending okay for hip and knee range of motion as tolerated.  They are recommending to remove dressings on 5 5 and leave open to air with dry gauze as needed and okay to begin showering on 07/17/2021. ?-Bowel Regimen initiated and now on Docusate 100 mg po BID and Miralax 17 g po Daily and she had a bowel movement  ?-PT and OT to evaluate and treat and they are recommending SNF short-term as she lives at [facility]. TOC assisting with D/C Disposition ?-Orthopedic surgery recommending oxycodone for as needed pain control at discharge and following up 2 weeks after discharge for wound check and repeat x-rays ?  ?Hyperlipidemia ?-Continue with Atorvastatin 20 mg po daily  ?  ?ABLA as a cause of Post-Operative Anemia ?-Patient's Hgb/Hct went from 14.6/43.0 -> 12.9/41.6 -> 10.4/32.8 -> 8.8/27.4 -> 8.2/25.8 -> 9.2/29.3 -> 8.4/27.3 ?-Could have had a Dilutional Component as well ?-Check Anemia Panel and showed an iron  level of 16, U IBC of 233, TIBC of 249, saturation ratio of 6%, ferritin level 35, folate level 10.6, vitamin B 12 level 434 ?-Initiate Iron Supplementation with Niferex 150 mg po Daily  ?-Continue to Monitor for S/Sx of Bleeding; No Overt Bleeding noted ?-Repeat CBC in the AM  ?  ?CKD Stage 3a ?-Patient's BUN/Cr went from 15/1.00 -> 11/1.05 -> 14/1.04 -> 17/1.15 -> 18/1.11 -> 20/1.09 -> 18/1.02 and  stable  ?-Avoid further nephrotoxic medications, contrast dyes, hypotension and and dehydration to ensure adequate renal perfusion and will need to renally adjust medications ?-Repeat CMP in the AM  ?  ?History of Pulmonary Embolism ?-Per Orthopedic Surgery ok to resume Apixaban and now back on 2.5 mg BID  ?  ?Hypophosphatemia ?-Patient's Phos Level  is now 3.5 ?-Continue to Monitor and Replete as Necessary ?-Repeat Phos Level in the AM  ?  ?Ambulatory dysfunction/dependent on wheelchair ?-We will consult PT/OT, likely will need SNF placement and is being worked up and [place] is being discussed with patient and the Daughter. [facility] approval needed and TOC assisting with D/C disposition  ?  ?Hyponatremia ?-Patient's sodium went from 140 -> 131 x2 -> 134 -> 136 -> 135 ?-We have discontinued her LR now ?-Continue monitor and trend and repeat CMP in a.m. ? ?Leukocytosis, mild ?-Likely reactive in the setting of Fall and Surgery and was improving and now slightly worsened ?-WBC went from 18.2 -> 13.1 -> 14.6 -> 10.5 -> 10.7 -> 10.5 but stable  ?-Continue to Monitor and Trend ?-Repeat CBC in the AM  ?  ?Hypothyroidism ?-Continue with Levothyroxine 100 mcg po Daily  ?  ?Hypertension ?-Continue with home medications, will continue with Metoprolol Succinate 50 mg po Daily  ?-Continue to Monitor BP per Protocol ?-Last BP reading was 115/55 ?  ?Hypoalbuminemia ?-Patient's Albumin Level went from 3.4 -> 2.3 x2 -> 2.6 -> 2.3 ?-Continue to Monitor and Trend ?-Repeat CMP in the AM  ?  ?GERD/GI Prophylaxis ?-Continue with PPI with Pantoprazole 40 mg po Daily  ?  ?Obesity ?-Complicates overall prognosis and care ?-Estimated body mass index is 31.01 kg/m? as calculated from the following: ?  Height as of this encounter: '5\' 3"'$  (1.6 m). ?  Weight as of this encounter: 79.4 kg.  ?-Weight Loss and Dietary Counseling given ?  ? ?DVT prophylaxis: apixaban (ELIQUIS) tablet 2.5 mg Start: 07/15/21 1000 ?SCDs Start: 07/14/21 1553 ?apixaban  (ELIQUIS) tablet 2.5 mg  ?  Code Status: DNR ?Family Communication: No family present at bedside ? ?Disposition Plan:  ?Level of care: Med-Surg ?Status is: Inpatient ?Remains inpatient appropriate because: Needs SNF and Insurance Approval. From a Medical Standpoint she is much improved  ?  ? ?Consultants:  ?Orthopedic Surgery  ? ?Procedures:  ?CEPHALOMEDULLARY NAILING LEFT INTERTROCHANTERIC FEMUR FRACTURE POD 5 ? ?Antimicrobials:  ?Anti-infectives (From admission, onward)  ? ? Start     Dose/Rate Route Frequency Ordered Stop  ? 07/15/21 1400  acyclovir (ZOVIRAX) 200 MG capsule 200 mg       ? 200 mg Oral 2 times daily 07/15/21 1309    ? 07/14/21 2000  ceFAZolin (ANCEF) IVPB 1 g/50 mL premix       ? 1 g ?100 mL/hr over 30 Minutes Intravenous Every 8 hours 07/14/21 1552 07/15/21 1407  ? 07/14/21 1200  ceFAZolin (ANCEF) IVPB 2g/100 mL premix       ? 2 g ?200 mL/hr over 30 Minutes Intravenous On call to O.R. 07/14/21 1155 07/14/21 1255  ? ?  ?  ?  Subjective: ?Seen and examined at bedside and thinks she is doing ok. Pain is well controlled per patient. States lower abdomen/hip is sore. Denies any CP or SOB. Having bowel movements. No other concerns or complaints at this time.  ? ?Objective: ?Vitals:  ? 07/18/21 1646 07/18/21 1904 07/19/21 0559 07/19/21 0757  ?BP:  (!) 118/56 (!) 94/52 (!) 115/55  ?Pulse:  94 96 94  ?Resp: '13 18 18 15  '$ ?Temp:  98 ?F (36.7 ?C) 97.8 ?F (36.6 ?C) 98.2 ?F (36.8 ?C)  ?TempSrc:  Oral  Oral  ?SpO2:  94% 97% 95%  ?Weight:      ?Height:      ? ?No intake or output data in the 24 hours ending 07/19/21 0914 ?Filed Weights  ? 07/14/21 0712 07/14/21 1232  ?Weight: 79.4 kg 79.4 kg  ? ?Examination: ?Physical Exam: ? ?Constitutional: WN/WD obese Caucasian female in NAD ?Respiratory: Diminished to auscultation bilaterally with coarse breath sounds, no wheezing, rales, rhonchi or crackles. Normal respiratory effort and patient is not tachypenic. No accessory muscle use. Unlabored breathing  ?Cardiovascular:  RRR, no murmurs / rubs / gallops. S1 and S2 auscultated.  ?Abdomen: Soft, non-tender, Distended 2/2 body habitus. Bowel sounds positive.  ?GU: Deferred. ?Musculoskeletal: No clubbing / cyanosis of digits/nails.

## 2021-07-19 NOTE — TOC Progression Note (Addendum)
Transition of Care (TOC) - Progression Note  ? ? ?Patient Details  ?Name: Christina Fry ?MRN: [id number] ?Date of Birth: [birth year] ? ?Transition of Care (TOC) CM/SW Contact  ?Joanne Chars, LCSW ?Phone Number: ?07/19/2021, 1:48 PM ? ?Clinical Narrative:   CSW LM wit [facility] to confirm they had SNF request paperwork.   ? ?1500: Phone call from [place].  This pt is active with [facility]. ? ?CSW spoke with [place] and she emailed SNF form along with required documentation list.   ? ? ? ?Expected Discharge Plan: [place] (SNF) ?Barriers to Discharge: Continued Medical Work up ? ?Expected Discharge Plan and Services ?Expected Discharge Plan: [place] (SNF) ?  ?Discharge Planning Services: CM Consult ?  ?Living arrangements for the past 2 months: Apartment ?                ?  ?  ?  ?  ?  ?  ?  ?  ?  ?  ? ? ?Social Determinants of Health (SDOH) Interventions ?  ? ?Readmission Risk Interventions ? ?  08/28/2020  ?  1:48 PM 12/06/2019  ?  2:10 PM  ?Readmission Risk Prevention Plan  ?Transportation Screening Complete Complete  ?[place] or Home Care Consult Complete   ?Social Work Consult for [place] Planning/Counseling Complete Complete  ?Palliative Care Screening Not Applicable   ?Medication Review Press photographer) Complete Complete  ? ? ?

## 2021-07-20 ENCOUNTER — Inpatient Hospital Stay (HOSPITAL_COMMUNITY): Payer: No Typology Code available for payment source

## 2021-07-20 DIAGNOSIS — I10 Essential (primary) hypertension: Secondary | ICD-10-CM | POA: Diagnosis not present

## 2021-07-20 DIAGNOSIS — S72002A Fracture of unspecified part of neck of left femur, initial encounter for closed fracture: Secondary | ICD-10-CM | POA: Diagnosis not present

## 2021-07-20 DIAGNOSIS — R262 Difficulty in walking, not elsewhere classified: Secondary | ICD-10-CM | POA: Diagnosis not present

## 2021-07-20 DIAGNOSIS — Z993 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: Secondary | ICD-10-CM | POA: Diagnosis not present

## 2021-07-20 DIAGNOSIS — M25512 Pain in left shoulder: Secondary | ICD-10-CM

## 2021-07-20 IMAGING — CR DG SHOULDER 2+V*L*
3 series · 3 of 3 positions shown · non-contrast
Comparison: None Available.

CLINICAL DATA: Left shoulder pain status post fall last week

EXAM:
LEFT SHOULDER - 2+ VIEW

[shoulder grashey]
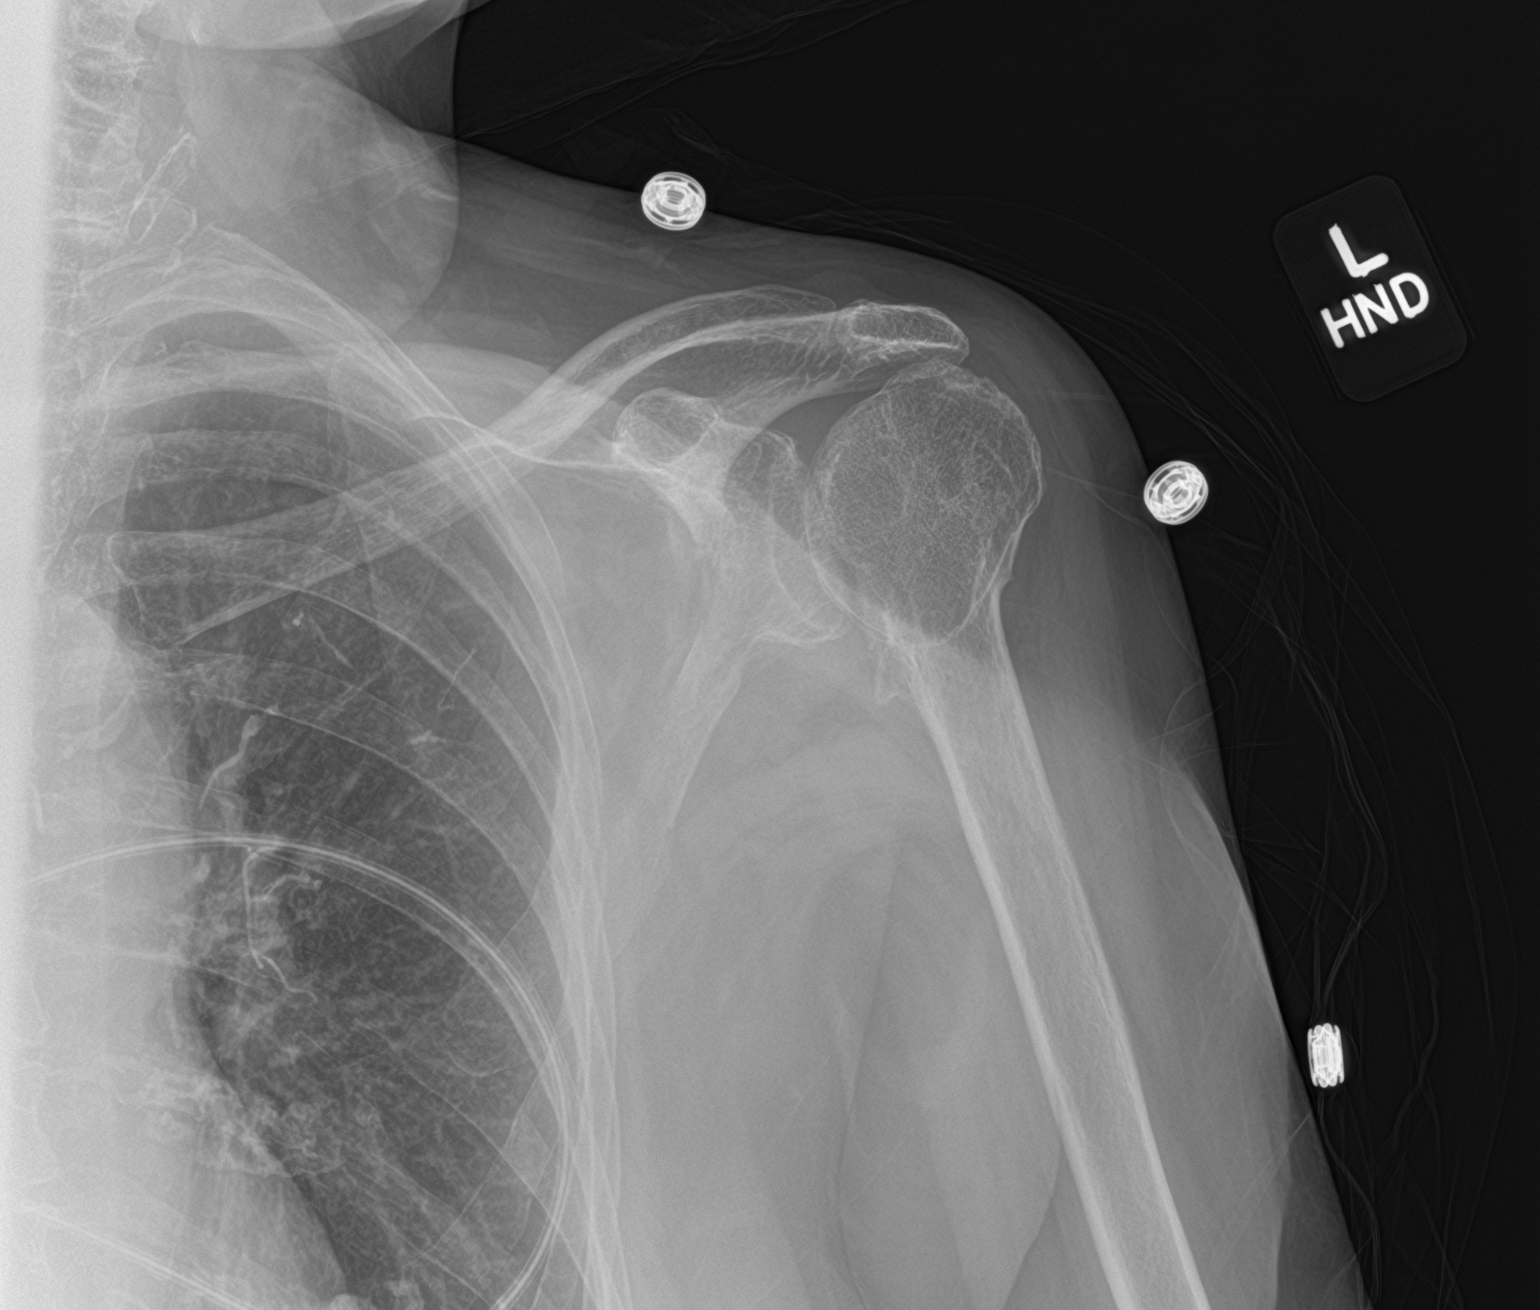

[shoulder y view]
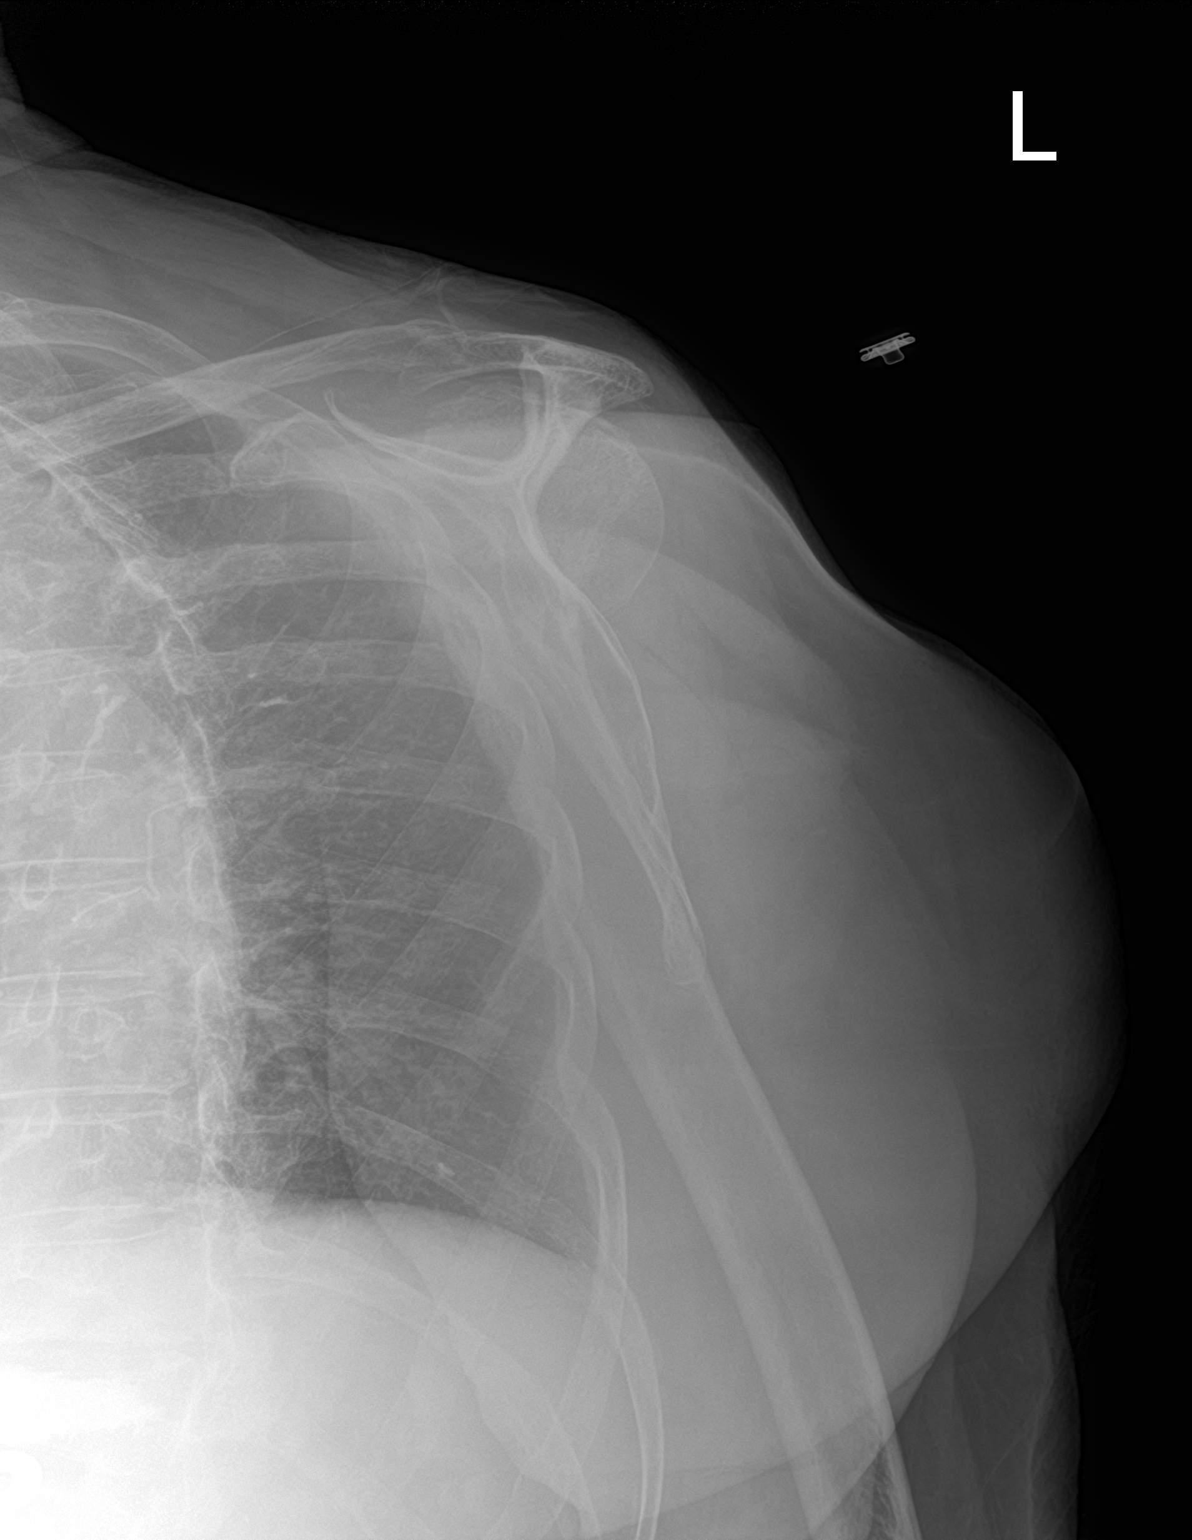

[shoulder axillary]
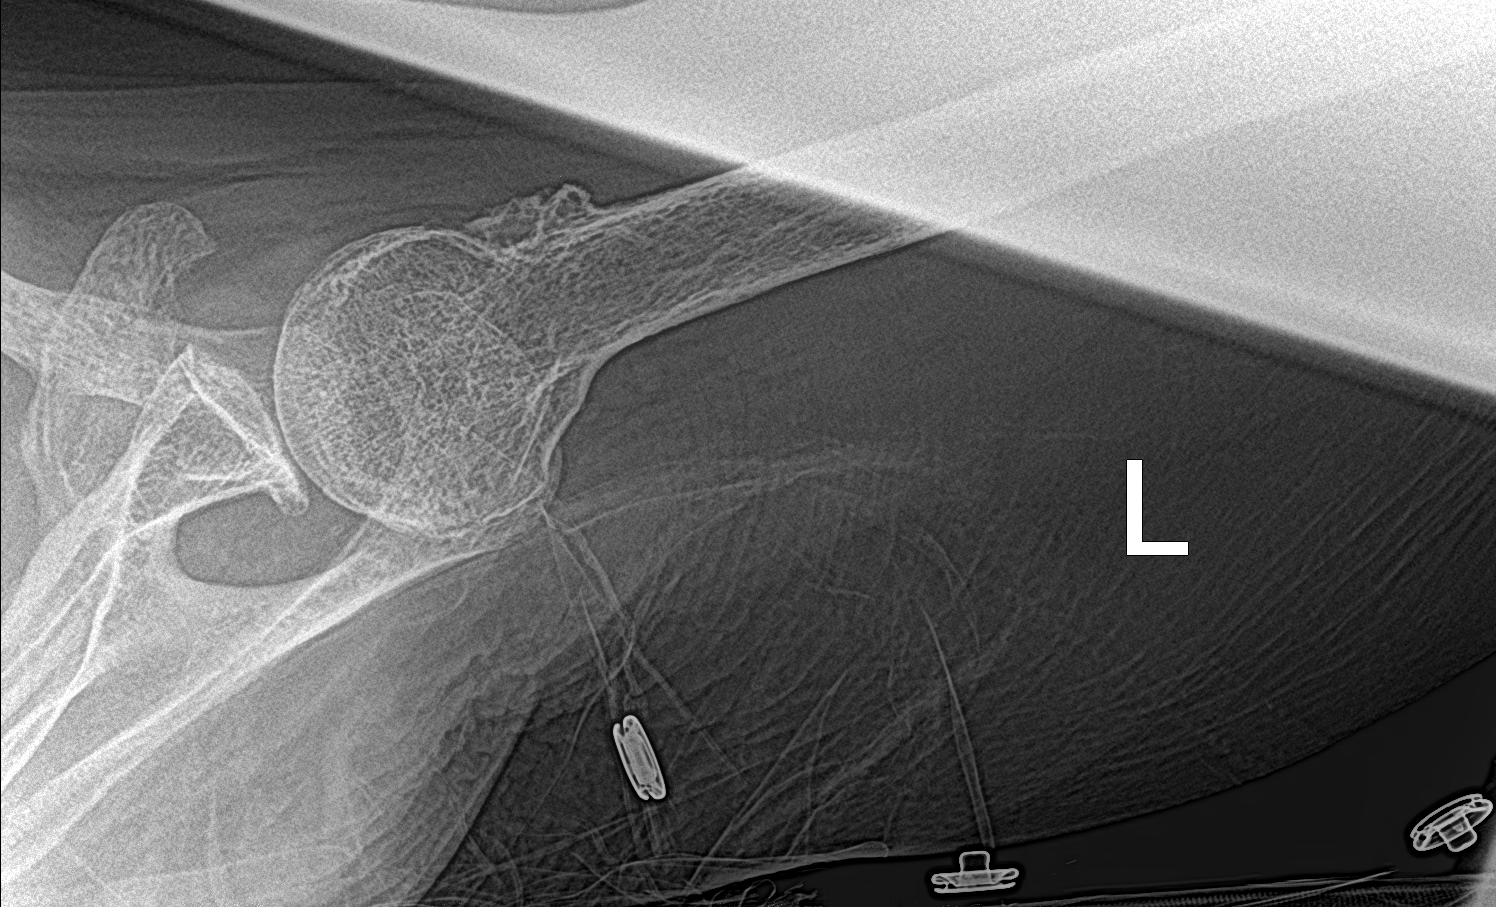

[3 of 3 positions shown; findings below may reference images not displayed]

FINDINGS: Generalized osteopenia. Old posttraumatic deformity of the surgical
neck of the left proximal humerus. No acute fracture or dislocation.
No aggressive osseous lesion. Normal alignment.

Soft tissue are unremarkable. No radiopaque foreign body or soft
tissue emphysema.
IMPRESSION: 1. No acute osseous injury of the left shoulder.

## 2021-07-20 MED ORDER — DICLOFENAC SODIUM 1 % EX GEL
2.0000 g | Freq: Four times a day (QID) | CUTANEOUS | Status: DC
Start: 2021-07-20 — End: 2021-07-27
  Administered 2021-07-20 – 2021-07-27 (×19): 2 g via TOPICAL
  Filled 2021-07-20: qty 100

## 2021-07-20 MED ORDER — LIDOCAINE 5 % EX PTCH
1.0000 | MEDICATED_PATCH | CUTANEOUS | Status: DC
  Administered 2021-07-20 – 2021-07-25 (×6): 1 via TRANSDERMAL
  Filled 2021-07-20 (×8): qty 1

## 2021-07-20 NOTE — Progress Notes (Addendum)
?PROGRESS NOTE ? ? ? ?Christina Fry Severe  FHQ:[id number] DOB: 05/08/1940 DOA: 07/14/2021 ?PCP: Ferdie Ping, MD  ? ?Brief Narrative:  ?HPI per Dr. Phillips Climes on 07/14/21 ? Christina Fry  is a 81 y.o. female, with past medical history of polymyalgia rheumatica, diabetes, asthma, Graves' disease, prior PE on Eliquis, patient presents to ED secondary to fall, patient report fall happened last time, she is mostly wheelchair bound, transfers between chair/bed and wheelchair at her independent living facility, reports last night she was trying to go from wheelchair to chair, she fell back striking her head into the hard floor, as well as her left leg, she was unable to stand up, or to call for help, and laid on the floor from 8 last night until this morning when she was found by staff, and reports severe headache from where she hit her head on the floor, neck pain, left hip pain, left knee pain, she was given fentanyl by EMS, she denies any focal deficits, no fever, no chills,. ?-In ED no significant lab abnormalities, CT head with no evidence of acute findings, findings significant for left hip fracture, ED discussed with orthopedic Dr. Doreatha Martin, who reports plan for surgery today, last dose of Eliquis was yesterday morning. ? ?**Interim History ?Underwent surgical intervention and is doing well postoperatively.  WBC is slightly went up but trended back down and is now stable and she does have an acute blood loss anemia as expected but Hgb/Hct is now improved today.  Need to continue monitor for signs of the bleeding.  PT OT recommending SNF and from a medical standpoint she is stable to D/C to SNF with continued Therapy but [facility] ([facility] but [facility] sent to [facility]).   ? ?Assessment and Plan: ? ?Left Hip Fracture status post cephalomedullary nailing of the left intertrochanteric femur fracture postoperative day 6 ?-Due to mechanical fall,  orthopedic input greatly appreciated, plan for surgical repair today, she is admitted under hip fracture pathway. ?-Was npo for Surgery and now on a Diet  ?-Eliquis now resumed ?-We will consult PT/OT, and TOC as likely will need SNF or rehab placement ?-Patient and daughter understand risks of surgery, she is mild to moderate risk, and they are agreeable to proceed ?-Patient reports she is DNR, but she understands it will be temporary suspended and she will be full code during procedure . ?-Continue with Pain Control as delineated per Ortho Recc's; she continues to have some pain and pain was little bit better controlled today than it was yesterday ?-Surgery recommends weightbearing as tolerated on left lower extremity and recommending okay for hip and knee range of motion as tolerated.  They are recommending to remove dressings on 5 5 and leave open to air with dry gauze as needed and okay to begin showering on 07/17/2021. ?-Bowel Regimen initiated and now on Docusate 100 mg po BID and Miralax 17 g po Daily and she had a bowel movement  ?-PT and OT to evaluate and treat and they are recommending SNF short-term as she lives at [facility]. TOC assisting with D/C Disposition ?-Orthopedic surgery recommending oxycodone for as needed pain control at discharge and following up 2 weeks after discharge for wound check and repeat x-rays ? ?Left Shoulder Pain ?-States it started hurting today and ? From Fall ?-Check DG Shoulder X-Ray ?-Start Lidocaine Patch an Voltaren gel ?  ?Hyperlipidemia ?-Continue with  Atorvastatin 20 mg po daily  ?  ?ABLA as a cause of Post-Operative Anemia ?-Patient's Hgb/Hct went from 14.6/43.0 -> 12.9/41.6 -> 10.4/32.8 -> 8.8/27.4 -> 8.2/25.8 -> 9.2/29.3 -> 8.4/27.3 on last check ?-Could have had a Dilutional Component as well ?-Check Anemia Panel and showed an iron level of 16, U IBC of 233, TIBC of 249, saturation ratio of 6%, ferritin level 35, folate level 10.6, vitamin B 12 level  434 ?-Initiate Iron Supplementation with Niferex 150 mg po Daily  ?-Continue to Monitor for S/Sx of Bleeding; No Overt Bleeding noted ?-Repeat CBC within 1 week  ?  ?CKD Stage 3a ?-Patient's BUN/Cr went from 15/1.00 -> 11/1.05 -> 14/1.04 -> 17/1.15 -> 18/1.11 -> 20/1.09 -> 18/1.02 and stable  ?-Avoid further nephrotoxic medications, contrast dyes, hypotension and and dehydration to ensure adequate renal perfusion and will need to renally adjust medications ?-Repeat CMP within 1 week  ?  ?History of Pulmonary Embolism ?-Per Orthopedic Surgery ok to resume Apixaban and now back on 2.5 mg BID  ?  ?Hypophosphatemia ?-Patient's Phos Level  is now 3.5 on last check  ?-Continue to Monitor and Replete as Necessary ?-Repeat Phos Level within 1 week  ?  ?Ambulatory dysfunction/dependent on wheelchair ?-We will consult PT/OT, likely will need SNF placement and is being worked up and [place] is being discussed with patient and the Daughter. [facility] approval needed and TOC assisting with D/C disposition  ?  ?Hyponatremia ?-Patient's sodium went from 140 -> 131 x2 -> 134 -> 136 -> 135 ?-We have discontinued her LR now ?-Continue monitor and trend and repeat CMP in a.m. ? ?Leukocytosis, mild ?-Likely reactive in the setting of Fall and Surgery and was improving and now slightly worsened ?-WBC went from 18.2 -> 13.1 -> 14.6 -> 10.5 -> 10.7 -> 10.5 but stable on last check  ?-Continue to Monitor and Trend ?-Repeat CBC within 1 week at SNF ?  ?Hypothyroidism ?-Continue with Levothyroxine 100 mcg po Daily  ?  ?Hypertension ?-Continue with home medications, will continue with Metoprolol Succinate 50 mg po Daily  ?-Continue to Monitor BP per Protocol ?-Last BP reading was 133/57 on last check  ?  ?Hypoalbuminemia ?-Patient's Albumin Level went from 3.4 -> 2.3 x2 -> 2.6 -> 2.3 on last check  ?-Continue to Monitor and Trend ?-Repeat CMP within 1 week at SNF ?  ?GERD/GI Prophylaxis ?-Continue with PPI with Pantoprazole 40 mg po Daily  ?   ?Obesity ?-Complicates overall prognosis and care ?-Estimated body mass index is 31.01 kg/m? as calculated from the following: ?  Height as of this encounter: '5\' 3"'$  (1.6 m). ?  Weight as of this encounter: 79.4 kg.  ?-Weight Loss and Dietary Counseling given ? ?DVT prophylaxis: apixaban (ELIQUIS) tablet 2.5 mg Start: 07/15/21 1000 ?SCDs Start: 07/14/21 1553 ?apixaban (ELIQUIS) tablet 2.5 mg  ?  Code Status: DNR ?Family Communication: No family present at bedside  ? ?Disposition Plan:  ?Level of care: Med-Surg ?Status is: Inpatient ?Remains inpatient appropriate because: Needs SNF and Insurance Approval. From a Medical Standpoint she is much improved  ?  ?Consultants:  ?Orthopedic Surgery  ? ?Procedures:  ?CEPHALOMEDULLARY NAILING LEFT INTERTROCHANTERIC FEMUR FRACTURE POD 6 ? ?Antimicrobials:  ?Anti-infectives (From admission, onward)  ? ? Start     Dose/Rate Route Frequency Ordered Stop  ? 07/15/21 1400  acyclovir (ZOVIRAX) 200 MG capsule 200 mg       ? 200 mg Oral 2 times daily 07/15/21 1309    ? 07/14/21  2000  ceFAZolin (ANCEF) IVPB 1 g/50 mL premix       ? 1 g ?100 mL/hr over 30 Minutes Intravenous Every 8 hours 07/14/21 1552 07/15/21 1407  ? 07/14/21 1200  ceFAZolin (ANCEF) IVPB 2g/100 mL premix       ? 2 g ?200 mL/hr over 30 Minutes Intravenous On call to O.R. 07/14/21 1155 07/14/21 1255  ? ?  ?  ?Subjective: ?Seen and examined at bedside and states she slept well. States her Left Shoulder is hurting now. No Nausea or Vomiting. Eating her breakfast. No other concerns or complaints at this time.  ? ?Objective: ?Vitals:  ? 07/19/21 2043 07/20/21 9977 07/20/21 0448 07/20/21 4142  ?BP: 123/66  (!) 133/57 122/62  ?Pulse: 83 (P) 95 96 99  ?Resp: 14 (P) '12 16 14  '$ ?Temp: 98.2 ?F (36.8 ?C) (P) 98.1 ?F (36.7 ?C) 98.1 ?F (36.7 ?C) 98.3 ?F (36.8 ?C)  ?TempSrc: Oral (P) Axillary Oral Oral  ?SpO2: 95% (P) 96% 94% 95%  ?Weight:      ?Height:      ? ? ?Intake/Output Summary (Last 24 hours) at 07/20/2021 0930 ?Last data filed at  07/20/2021 0900 ?Gross per 24 hour  ?Intake 600 ml  ?Output --  ?Net 600 ml  ? ?Filed Weights  ? 07/14/21 3953 07/14/21 1232  ?Weight: 79.4 kg 79.4 kg  ? ?Examination: ?Physical Exam: ? ?Constitutional: WN/WD ob

## 2021-07-20 NOTE — TOC Progression Note (Addendum)
Transition of Care (TOC) - Progression Note  ? ? ?Patient Details  ?Name: Christina Fry ?MRN: [id number] ?Date of Birth: 09-16-1940 ? ?Transition of Care (TOC) CM/SW Contact  ?Joanne Chars, LCSW ?Phone Number: ?07/20/2021, 8:19 AM ? ?Clinical Narrative:   Required documentation faxed to [facility]. ? ?Fax machine not working well today.  Email received from [place] that she got partial fax, referral was refaxed. ? ?Referrals also faxed, several times, to the following [facility] SNF facilities: ? ?[facility] in [place], [place] ?[facility] in [place] ?[facility] in [place], [place] ?[facility] in [place] ?[facility] in [place].  ? ? ? ?Expected Discharge Plan: [place] (SNF) ?Barriers to Discharge: Continued Medical Work up ? ?Expected Discharge Plan and Services ?Expected Discharge Plan: [place] (SNF) ?  ?Discharge Planning Services: CM Consult ?  ?Living arrangements for the past 2 months: Apartment ?                ?  ?  ?  ?  ?  ?  ?  ?  ?  ?  ? ? ?Social Determinants of Health (SDOH) Interventions ?  ? ?Readmission Risk Interventions ? ?  08/28/2020  ?  1:48 PM 12/06/2019  ?  2:10 PM  ?Readmission Risk Prevention Plan  ?Transportation Screening Complete Complete  ?[place] or Home Care Consult Complete   ?Social Work Consult for [place] Planning/Counseling Complete Complete  ?Palliative Care Screening Not Applicable   ?Medication Review Press photographer) Complete Complete  ? ? ?

## 2021-07-21 ENCOUNTER — Inpatient Hospital Stay (HOSPITAL_COMMUNITY): Payer: No Typology Code available for payment source

## 2021-07-21 DIAGNOSIS — Z993 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: Secondary | ICD-10-CM | POA: Diagnosis not present

## 2021-07-21 DIAGNOSIS — S72002P Fracture of unspecified part of neck of left femur, subsequent encounter for closed fracture with malunion: Secondary | ICD-10-CM | POA: Diagnosis not present

## 2021-07-21 DIAGNOSIS — M25562 Pain in left knee: Secondary | ICD-10-CM

## 2021-07-21 DIAGNOSIS — I10 Essential (primary) hypertension: Secondary | ICD-10-CM | POA: Diagnosis not present

## 2021-07-21 DIAGNOSIS — I2782 Chronic pulmonary embolism: Secondary | ICD-10-CM

## 2021-07-21 DIAGNOSIS — R262 Difficulty in walking, not elsewhere classified: Secondary | ICD-10-CM | POA: Diagnosis not present

## 2021-07-21 LAB — COMPREHENSIVE METABOLIC PANEL
ALT: 13 U/L (ref 0–44)
AST: 16 U/L (ref 15–41)
Albumin: 2.3 g/dL — ABNORMAL LOW (ref 3.5–5.0)
Alkaline Phosphatase: 79 U/L (ref 38–126)
Anion gap: 6 (ref 5–15)
BUN: 17 mg/dL (ref 8–23)
CO2: 30 mmol/L (ref 22–32)
Calcium: 8.9 mg/dL (ref 8.9–10.3)
Chloride: 99 mmol/L (ref 98–111)
Creatinine, Ser: 1.02 mg/dL — ABNORMAL HIGH (ref 0.44–1.00)
GFR, Estimated: 55 mL/min — ABNORMAL LOW (ref >60)
Glucose, Bld: 139 mg/dL — ABNORMAL HIGH (ref 70–99)
Potassium: 4.3 mmol/L (ref 3.5–5.1)
Sodium: 135 mmol/L (ref 135–145)
Total Bilirubin: 0.9 mg/dL (ref 0.3–1.2)
Total Protein: 5.3 g/dL — ABNORMAL LOW (ref 6.5–8.1)

## 2021-07-21 LAB — CBC WITH DIFFERENTIAL/PLATELET
Abs Immature Granulocytes: 0.06 10*3/uL (ref 0.00–0.07)
Basophils Absolute: 0.1 10*3/uL (ref 0.0–0.1)
Basophils Relative: 1 %
Eosinophils Absolute: 0.4 10*3/uL (ref 0.0–0.5)
Eosinophils Relative: 4 %
HCT: 27.4 % — ABNORMAL LOW (ref 36.0–46.0)
Hemoglobin: 8.5 g/dL — ABNORMAL LOW (ref 12.0–15.0)
Immature Granulocytes: 1 %
Lymphocytes Relative: 22 %
Lymphs Abs: 2.5 10*3/uL (ref 0.7–4.0)
MCH: 30.5 pg (ref 26.0–34.0)
MCHC: 31 g/dL (ref 30.0–36.0)
MCV: 98.2 fL (ref 80.0–100.0)
Monocytes Absolute: 1.1 10*3/uL — ABNORMAL HIGH (ref 0.1–1.0)
Monocytes Relative: 10 %
Neutro Abs: 7.1 10*3/uL (ref 1.7–7.7)
Neutrophils Relative %: 62 %
Platelets: 347 10*3/uL (ref 150–400)
RBC: 2.79 MIL/uL — ABNORMAL LOW (ref 3.87–5.11)
RDW: 15.7 % — ABNORMAL HIGH (ref 11.5–15.5)
WBC: 11.3 10*3/uL — ABNORMAL HIGH (ref 4.0–10.5)
nRBC: 0 % (ref 0.0–0.2)

## 2021-07-21 LAB — MAGNESIUM: Magnesium: 1.9 mg/dL (ref 1.7–2.4)

## 2021-07-21 LAB — PHOSPHORUS: Phosphorus: 4.4 mg/dL (ref 2.5–4.6)

## 2021-07-21 IMAGING — CT CT KNEE*L* W/O CM
4 series · 16 of 33 positions shown, 19 images · non-contrast
Comparison: Left knee x-rays dated [DATE].

CLINICAL DATA: Left knee injury.

EXAM:
CT OF THE LEFT KNEE WITHOUT CONTRAST
TECHNIQUE: Multidetector CT imaging of the left knee was performed according to
the standard protocol. Multiplanar CT image reconstructions were
also generated.
RADIATION DOSE REDUCTION: This exam was performed according to the
departmental dose-optimization program which includes automated
exposure control, adjustment of the mA and/or kV according to
patient size and/or use of iterative reconstruction technique.

[Series 3: lower ext 3.0 u90u · axial · 0.49mm/px · z∈[+842,+974]mm · 5 of 67 slices shown, 7 images]
[im 12/67  soft-tissue]
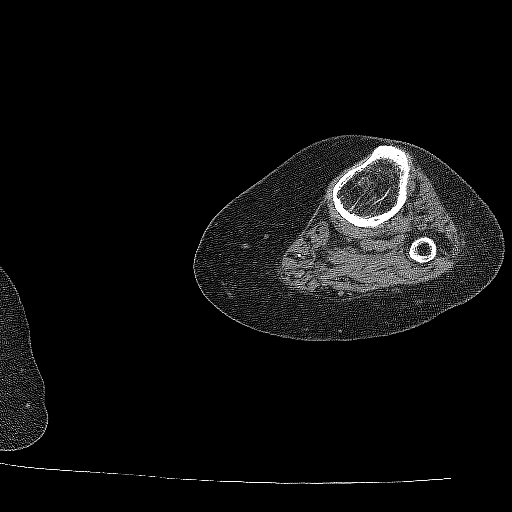
[im 12/67  bone]
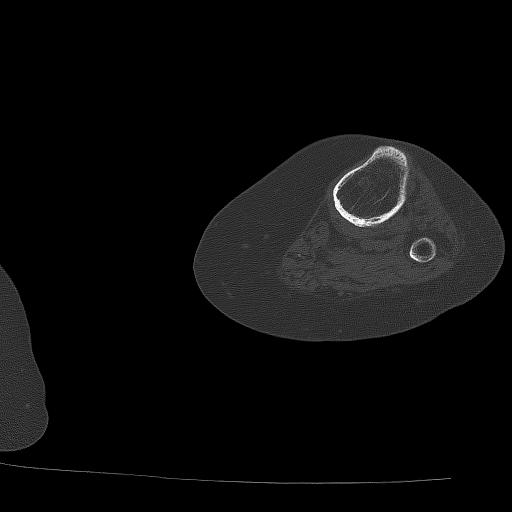
[im 23/67  bone]
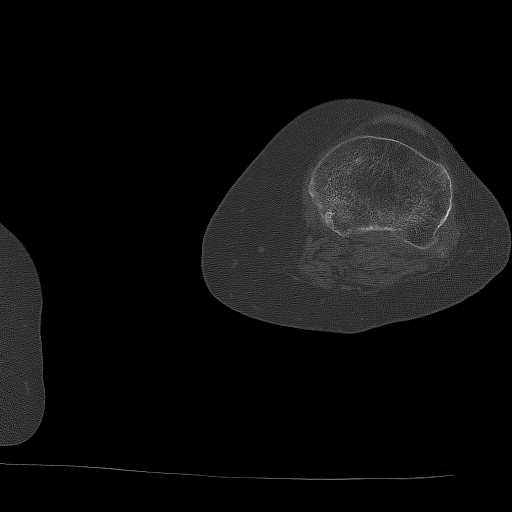
[im 34/67  bone]
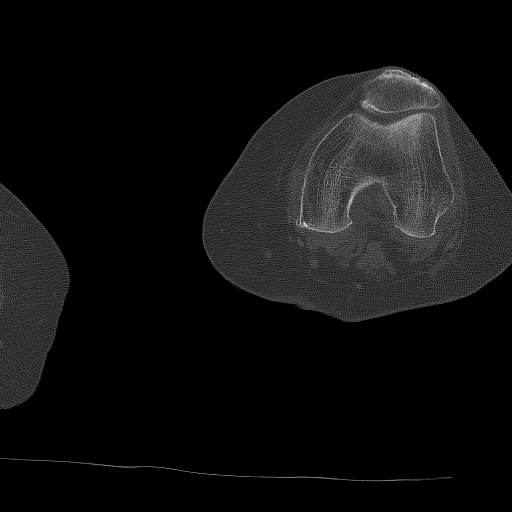
[im 45/67  bone]
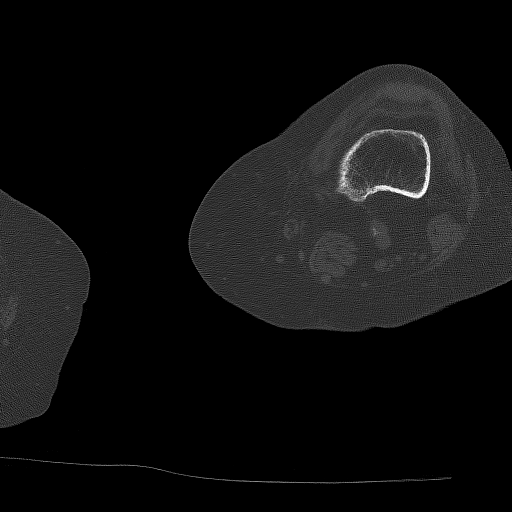
[im 56/67  soft-tissue]
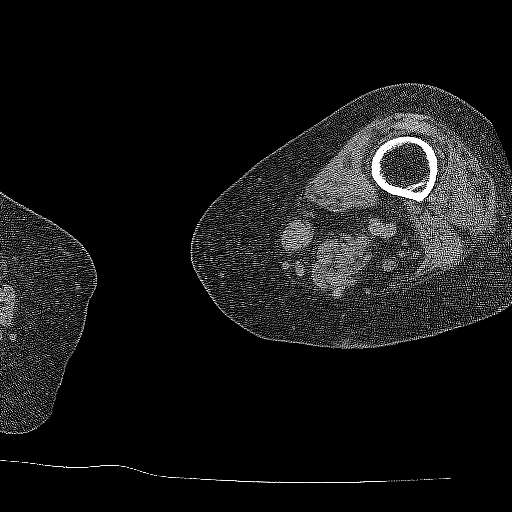
[im 56/67  bone]
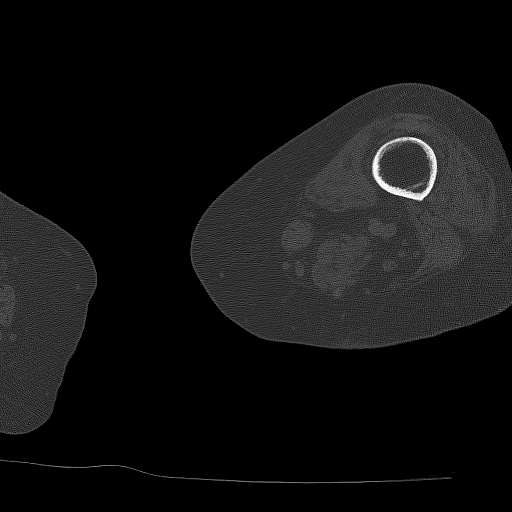

[Series 4: lower ext 3.0 u30u · axial · 0.49mm/px · z∈[+842,+908]mm · 3 of 67 slices shown]
[im 12/67  bone]
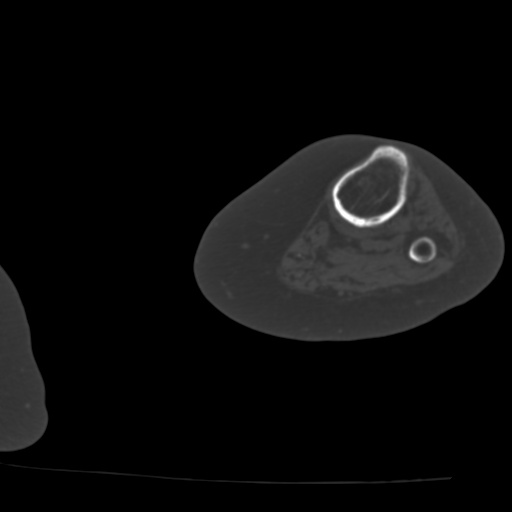
[im 23/67  bone]
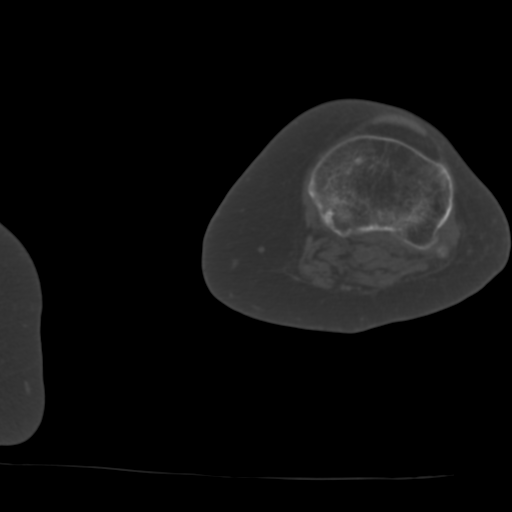
[im 34/67  bone]
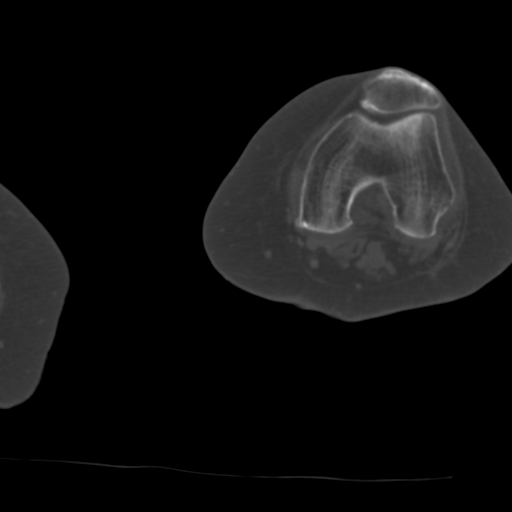

[Series 7: coronal soft tissue · coronal · 0.39mm/px · 3 of 76 slices shown]
[im 16/76  bone]
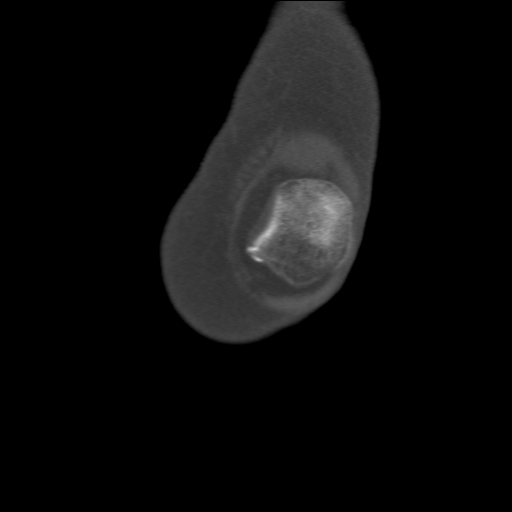
[im 31/76  bone]
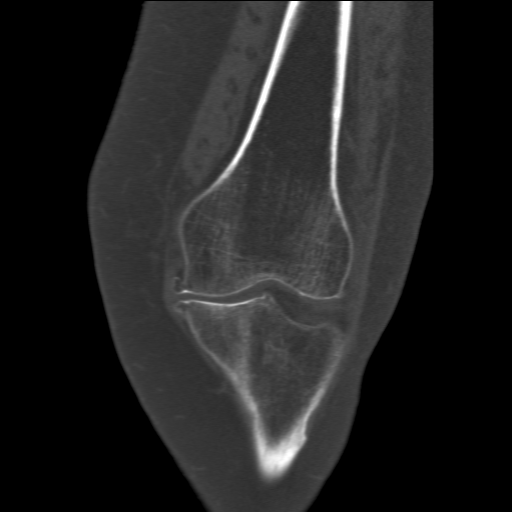
[im 46/76  bone]
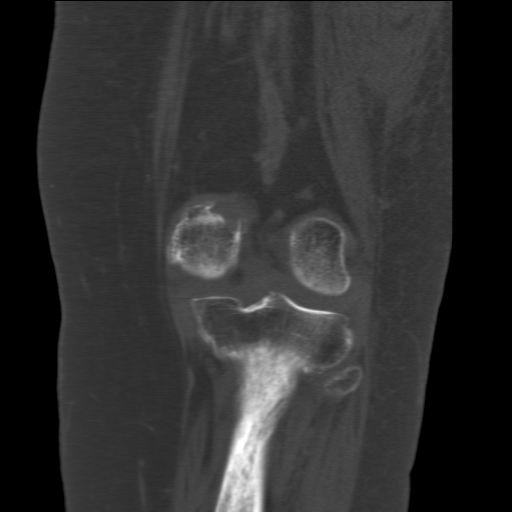

[Series 8: sagittal soft tissue · sagittal · 0.36mm/px · 5 of 86 slices shown, 6 images]
[im 29/86  bone]
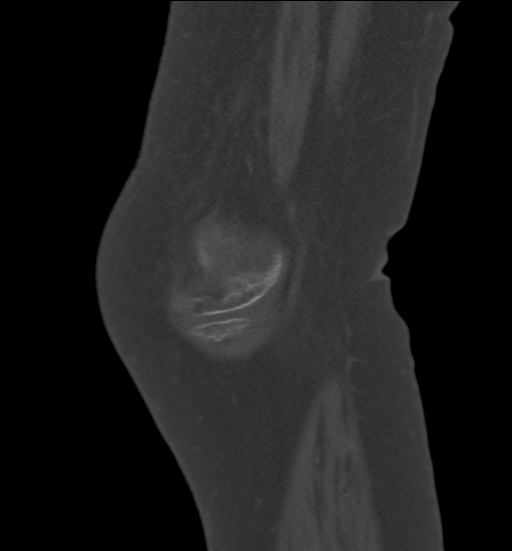
[im 36/86  bone]
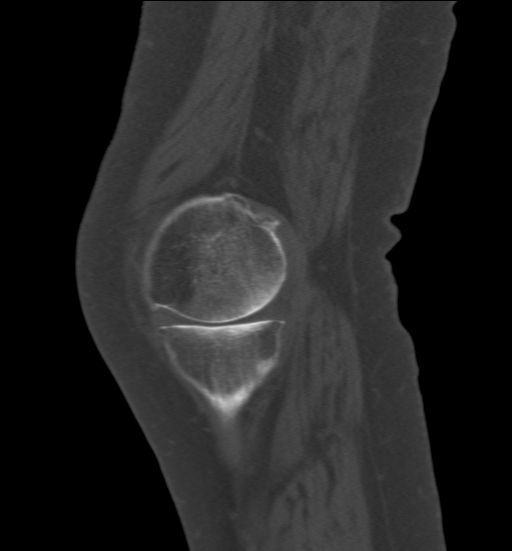
[im 43/86  soft-tissue]
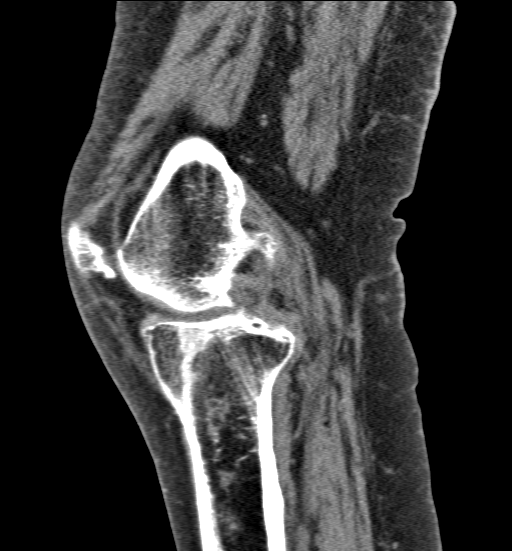
[im 43/86  bone]
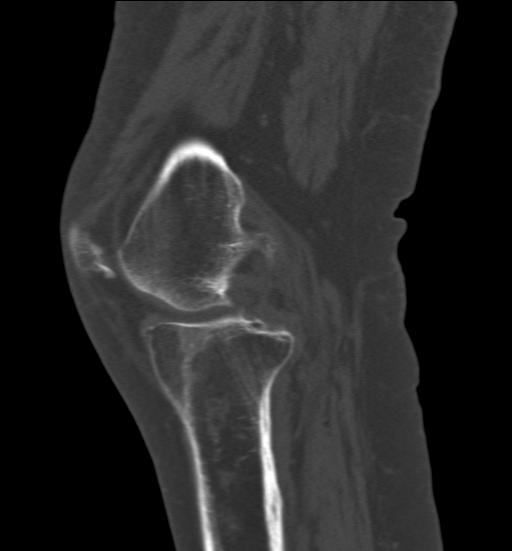
[im 50/86  bone]
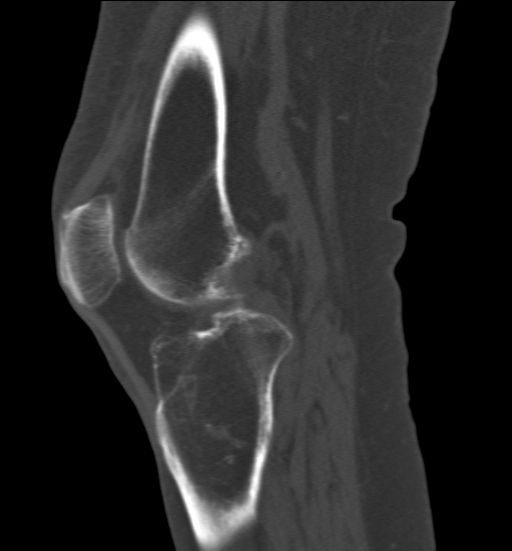
[im 57/86  bone]
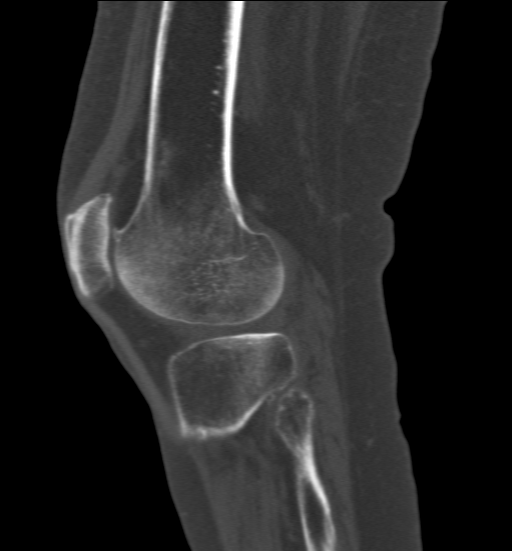

[16 of 33 positions shown; findings below may reference images not displayed]

FINDINGS: Bones/Joint/Cartilage

No fracture or dislocation. Moderate-to-severe medial compartment
joint space narrowing. Mild lateral and patellofemoral compartment
joint space narrowing. Tricompartmental marginal osteophytes. No
joint effusion. Osteopenia.

Ligaments

Ligaments are suboptimally evaluated by CT.

Muscles and Tendons
Grossly intact.

Soft tissue
No fluid collection or hematoma.  No soft tissue mass.
IMPRESSION: 1. No acute osseous abnormality.
2. Tricompartmental osteoarthritis, moderate-to-severe in the medial
compartment.

## 2021-07-21 NOTE — Progress Notes (Signed)
OT Cancellation Note ? ?Patient Details ?Name: Christina Fry ?MRN: [id number] ?DOB: 1940-06-06 ? ? ?Cancelled Treatment:    Reason Eval/Treat Not Completed: Patient declined, no reason specified Pt reports pending CT LE and "feel there is something wrong there. It just doesn't work".  ? ?Jeri Modena ?07/21/2021, 3:08 PM ?

## 2021-07-21 NOTE — Progress Notes (Signed)
Orthopaedic Trauma Progress Note ? ?SUBJECTIVE: Doing okay this afternoon.  Not having much pain in the hip but continues to have increased pain in the left knee.  She does have osteoarthritis of the left knee at baseline, but denies ever having significant issues with the pain prior to this.  She has been receiving Voltaren 1% topical gel on her left shoulder which has been very helpful and is asking if this can also be applied to her knee.  I think this is appropriate and have adjusted the order to reflect this. ? ?OBJECTIVE:  ?Vitals:  ? 07/21/21 0438 07/21/21 0803  ?BP: 137/63 106/75  ?Pulse: 98 99  ?Resp: 16 16  ?Temp: 98.5 ?F (36.9 ?C) 98.6 ?F (37 ?C)  ?SpO2: 95% 94%  ? ? ?General: Laying in bed, no acute distress ?Respiratory: No increased work of breathing.  ?Left lower extremity:Incisions are clean, dry, intact.  No significant tenderness with palpation throughout the thigh.  Has medial and lateral joint line tenderness.  Also endorses some discomfort with palpation over the tibial tubercle.  Tolerates gentle ankle range of motion.  Endorses sensation throughout extremity.  + EHL/FHL.  2+ DP pulse ? ?IMAGING: Stable post op imaging.  ? ?LABS:  ?Results for orders placed or performed during the hospital encounter of 07/14/21 (from the past 24 hour(s))  ?CBC with Differential/Platelet     Status: Abnormal  ? Collection Time: 07/21/21  3:18 AM  ?Result Value Ref Range  ? WBC 11.3 (H) 4.0 - 10.5 K/uL  ? RBC 2.79 (L) 3.87 - 5.11 MIL/uL  ? Hemoglobin 8.5 (L) 12.0 - 15.0 g/dL  ? HCT 27.4 (L) 36.0 - 46.0 %  ? MCV 98.2 80.0 - 100.0 fL  ? MCH 30.5 26.0 - 34.0 pg  ? MCHC 31.0 30.0 - 36.0 g/dL  ? RDW 15.7 (H) 11.5 - 15.5 %  ? Platelets 347 150 - 400 K/uL  ? nRBC 0.0 0.0 - 0.2 %  ? Neutrophils Relative % 62 %  ? Neutro Abs 7.1 1.7 - 7.7 K/uL  ? Lymphocytes Relative 22 %  ? Lymphs Abs 2.5 0.7 - 4.0 K/uL  ? Monocytes Relative 10 %  ? Monocytes Absolute 1.1 (H) 0.1 - 1.0 K/uL  ? Eosinophils Relative 4 %  ? Eosinophils  Absolute 0.4 0.0 - 0.5 K/uL  ? Basophils Relative 1 %  ? Basophils Absolute 0.1 0.0 - 0.1 K/uL  ? Immature Granulocytes 1 %  ? Abs Immature Granulocytes 0.06 0.00 - 0.07 K/uL  ?Comprehensive metabolic panel     Status: Abnormal  ? Collection Time: 07/21/21  3:18 AM  ?Result Value Ref Range  ? Sodium 135 135 - 145 mmol/L  ? Potassium 4.3 3.5 - 5.1 mmol/L  ? Chloride 99 98 - 111 mmol/L  ? CO2 30 22 - 32 mmol/L  ? Glucose, Bld 139 (H) 70 - 99 mg/dL  ? BUN 17 8 - 23 mg/dL  ? Creatinine, Ser 1.02 (H) 0.44 - 1.00 mg/dL  ? Calcium 8.9 8.9 - 10.3 mg/dL  ? Total Protein 5.3 (L) 6.5 - 8.1 g/dL  ? Albumin 2.3 (L) 3.5 - 5.0 g/dL  ? AST 16 15 - 41 U/L  ? ALT 13 0 - 44 U/L  ? Alkaline Phosphatase 79 38 - 126 U/L  ? Total Bilirubin 0.9 0.3 - 1.2 mg/dL  ? GFR, Estimated 55 (L) >60 mL/min  ? Anion gap 6 5 - 15  ?Phosphorus     Status: None  ?  Collection Time: 07/21/21  3:18 AM  ?Result Value Ref Range  ? Phosphorus 4.4 2.5 - 4.6 mg/dL  ?Magnesium     Status: None  ? Collection Time: 07/21/21  3:18 AM  ?Result Value Ref Range  ? Magnesium 1.9 1.7 - 2.4 mg/dL  ? ? ?ASSESSMENT: Christina Fry is a 81 y.o. female, 7 Days Post-Op s/p ?CEPHALOMEDULLARY NAILING LEFT INTERTROCHANTERIC FEMUR FRACTURE ? ?CV/Blood loss: Acute blood loss anemia, Hgb 8.5 this morning. Hemodynamically stable ? ?PLAN: ?Weightbearing: WBAT LLE ?ROM: Okay for hip and knee range of motion as tolerated ?Incisional and dressing care: Ok to leave open to air ?Showering: Okay to shower with assistance.  Incisions may get wet  ?Orthopedic device(s):  Gilford Rile, wheelchair   ?Pain management:  ?1. Tylenol 650 mg q 6 hours scheduled ?2. Robaxin 500 mg q 6 hours PRN ?3. Oxycodone 5 mg q 4 hours PRN ?4. Dilaudid 0.5 mg q 2 hours PRN ?5. Gabapentin 200 mg twice daily ?6.  Voltaren gel 1% 4 times daily ?VTE prophylaxis:  Eliquis , SCDs ?ID:  Ancef 2gm post op completed ?Foley/Lines:  No foley, KVO IVFs ?Impediments to Fracture Healing: Vitamin D level 65, no additional  supplementation indicated ?Dispo: CT left knee ordered to evaluate for occult fracture.  I will follow-up with patient once scan done. Continue therapies as tolerated. Patient lives at [facility], will likely need short-term SNF at discharge. TOC following. Patient ok for d/c from ortho standpoint once cleared by therapies and medicine team. ? ?D/C recommendations: ?-Oxycodone for pain control ?-Continue home dose Eliquis for DVT prophylaxis ?-No additional Vit D supplementation needed ? ?Follow - up plan: 2 weeks after discharge for wound check and repeat x-rays ? ? ?Contact information:  Katha Hamming MD, Rushie Nyhan PA-C. After hours and holidays please check [email or web address] for group call information for [facility] ? ? ?Gwinda Passe, PA-C ?[phone] (office) ?[email or web address] ? ? ? ?

## 2021-07-21 NOTE — TOC Progression Note (Addendum)
Transition of Care (TOC) - Progression Note  ? ? ?Patient Details  ?Name: [name] ?MRN: [id number] ?Date of Birth: 07-21-1940 ? ?Transition of Care (TOC) CM/SW Contact  ?Joanne Chars, LCSW ?Phone Number: ?07/21/2021, 8:32 AM ? ?Clinical Narrative:    ?Email received from [facility], she does have entire fax, auth request is under review, should be complete in 1-2 days. ? ?Fax failures noted despite multiple attempts to: ?[facility] ?Marketing executive ? ?1115: CSW LM with following SNFs as follow up to faxed referrals: ?[facility] ?[facility] ?[facility] ? ? ?Expected Discharge Plan: [place] (SNF) ?Barriers to Discharge: Continued Medical Work up ? ?Expected Discharge Plan and Services ?Expected Discharge Plan: [place] (SNF) ?  ?Discharge Planning Services: CM Consult ?  ?Living arrangements for the past 2 months: Apartment ?                ?  ?  ?  ?  ?  ?  ?  ?  ?  ?  ? ? ?Social Determinants of Health (SDOH) Interventions ?  ? ?Readmission Risk Interventions ? ?  08/28/2020  ?  1:48 PM 12/06/2019  ?  2:10 PM  ?Readmission Risk Prevention Plan  ?Transportation Screening Complete Complete  ?[place] or Home Care Consult Complete   ?Social Work Consult for [place] Planning/Counseling Complete Complete  ?Palliative Care Screening Not Applicable   ?Medication Review Press photographer) Complete Complete  ? ? ?

## 2021-07-21 NOTE — Progress Notes (Signed)
Pt complained of left knee pain 10 out of 10. Pt states she feels like its broke. MD made aware. See new orders.  ?

## 2021-07-21 NOTE — Plan of Care (Signed)
  Problem: Education: Goal: Knowledge of General Education information will improve Description: Including pain rating scale, medication(s)/side effects and non-pharmacologic comfort measures Outcome: Progressing   Problem: Clinical Measurements: Goal: Respiratory complications will improve Outcome: Progressing   Problem: Nutrition: Goal: Adequate nutrition will be maintained Outcome: Progressing   

## 2021-07-21 NOTE — Progress Notes (Signed)
Physical Therapy Treatment ?Patient Details ?Name: Christina Fry ?MRN: [id number] ?DOB: 31-Jan-1941 ?Today's Date: 07/21/2021 ? ? ?History of Present Illness 81 y.o. female admitted from [facility] on 07/14/21 after fall when trying to go from w/c to chair and striking her head on floor, found down by staff the next morning. Head/cervical CT negative for acute abnormality. Workup for L intertrochanteric femur fx s/p cephalomedullary nailing 5/3. PMH includes DM, fibromyalgia, kyphoplasty (04/2021), HLD, anxiety, cancer. ? ?  ?PT Comments  ? ? Pt received supine and agreeable to session focused on bed level therex as pt citing knee pain and declining OOB mobility. Pt perseverating throughout session on lack of strength of LLE, substantial time spent educating pt on importance and benefits of weight bearing, continued mobility and time spent OOB for maintenance/ improvement of strength, pt verbalizing skepticism. Pt with fair tolerance for LE therex at bed level. Pt continues to benefit from skilled PT services to progress toward functional mobility goals.  ?   ?Recommendations for follow up therapy are one component of a multi-disciplinary discharge planning process, led by the attending physician.  Recommendations may be updated based on patient status, additional functional criteria and insurance authorization. ? ?Follow Up Recommendations ? Skilled nursing-short term rehab (<3 hours/day) ?  ?  ?Assistance Recommended at Discharge Frequent or constant Supervision/Assistance  ?Patient can return home with the following Two people to help with walking and/or transfers;A lot of help with bathing/dressing/bathroom ?  ?Equipment Recommendations ? None recommended by PT  ?  ?Recommendations for Other Services   ? ? ?  ?Precautions / Restrictions Precautions ?Precautions: Fall;Other (comment) ?Precaution Comments: urine incontinence/ TLSO brace in room (pt has been wearing brace since kyphoplasty 04/2021 but states she  can't put it on now with her hip pain) ?Restrictions ?Weight Bearing Restrictions: Yes ?LLE Weight Bearing: Weight bearing as tolerated  ?  ? ?Mobility ? Bed Mobility ?Overal bed mobility: Needs Assistance ?  ?  ?  ?  ?  ?  ?General bed mobility comments: pt refusing OOB mobility ?  ? ?Transfers ?  ?  ?  ?  ?  ?  ?  ?  ?  ?General transfer comment: pt declined ?  ? ?Ambulation/Gait ?  ?  ?  ?  ?  ?  ?  ?  ? ? ?Stairs ?  ?  ?  ?  ?  ? ? ?Wheelchair Mobility ?Wheelchair Mobility ?Wheelchair propulsion: Both upper extremities ? ?Modified Rankin (Stroke Patients Only) ?  ? ? ?  ?Balance Overall balance assessment: Needs assistance ?Sitting-balance support: No upper extremity supported, Feet supported ?Sitting balance-Leahy Scale: Fair ?  ?  ?Standing balance support: Bilateral upper extremity supported, During functional activity ?Standing balance-Leahy Scale: Poor ?  ?  ?  ?  ?  ?  ?  ?  ?  ?  ?  ?  ?  ? ?  ?Cognition Arousal/Alertness: Awake/alert ?Behavior During Therapy: [facility] for tasks assessed/performed ?Overall Cognitive Status: No family/caregiver present to determine baseline cognitive functioning ?Area of Impairment: Attention, Memory, Following commands, Safety/judgement, Awareness, Problem solving ?  ?  ?  ?  ?  ?  ?  ?  ?  ?Current Attention Level: Selective ?Memory: Decreased short-term memory ?Following Commands: Follows one step commands with increased time, Follows multi-step commands inconsistently ?Safety/Judgement: Decreased awareness of safety, Decreased awareness of deficits ?Awareness: Emergent ?Problem Solving: Requires verbal cues ?General Comments: Pt using humor to mask cognitive deficits noted during  session. Pt could benefit from further cognitive assessment for safety and executive functional deficits. ?  ?  ? ?  ?Exercises General Exercises - Lower Extremity ?Ankle Circles/Pumps: AROM, Both, 10 reps ?Gluteal Sets: Both, 10 reps ?Heel Slides: AAROM, 10 reps, Supine, Right ?Hip  ABduction/ADduction: AAROM, Left, 10 reps, Supine, Right ?Straight Leg Raises: AROM, Right, AAROM, Left, 10 reps ? ?  ?General Comments   ?  ?  ? ?Pertinent Vitals/Pain Pain Assessment ?Pain Assessment: 0-10 ?Pain Score: 7  ?Pain Location: LLE ?Pain Descriptors / Indicators: Grimacing, Guarding ?Pain Intervention(s): Limited activity within patient's tolerance, Monitored during session, Repositioned  ? ? ?Home Living   ?  ?  ?  ?  ?  ?  ?  ?  ?  ?   ?  ?Prior Function    ?  ?  ?   ? ?PT Goals (current goals can now be found in the care plan section) Acute Rehab PT Goals ?Patient Stated Goal: rehab at SNF before return to [facility] ?PT Goal Formulation: With patient ?Time For Goal Achievement: 07/30/21 ? ?  ?Frequency ? ? ? Min 3X/week ? ? ? ?  ?PT Plan Current plan remains appropriate  ? ? ?Co-evaluation   ?  ?  ?  ?  ? ?  ?AM-PAC PT "6 Clicks" Mobility   ?Outcome Measure ? Help needed turning from your back to your side while in a flat bed without using bedrails?: A Lot ?Help needed moving from lying on your back to sitting on the side of a flat bed without using bedrails?: A Lot ?Help needed moving to and from a bed to a chair (including a wheelchair)?: Total ?Help needed standing up from a chair using your arms (e.g., wheelchair or bedside chair)?: A Lot ?Help needed to walk in hospital room?: Total ?Help needed climbing 3-5 steps with a railing? : Total ?6 Click Score: 9 ? ?  ?End of Session   ?Activity Tolerance: Patient limited by pain ?Patient left: in bed;with call bell/phone within reach;with bed alarm set ?Nurse Communication: Mobility status ?PT Visit Diagnosis: Other abnormalities of gait and mobility (R26.89);Muscle weakness (generalized) (M62.81);Pain ?Pain - Right/Left: Left ?Pain - part of body: Leg ?  ? ? ?Time: 1281-1886 ?PT Time Calculation (min) (ACUTE ONLY): 36 min ? ?Charges:  $Therapeutic Exercise: 8-22 mins ?$Therapeutic Activity: 8-22 mins          ?          ? ?Audry Riles. PTA ?Acute Rehabilitation  Services ?Office: [phone] ? ? ?Betsey Holiday Jasleen Riepe ?07/21/2021, 2:13 PM ? ?

## 2021-07-21 NOTE — Plan of Care (Signed)

## 2021-07-21 NOTE — Progress Notes (Signed)
?PROGRESS NOTE ? ? ? ?Christina Fry  EZM:[id number] DOB: [birth year] DOA: 07/14/2021 ?PCP: Ferdie Ping, MD  ? ? ?Chief Complaint  ?Patient presents with  ? Fall  ? ? ?Brief Narrative:  ?HPI per Dr. Phillips Climes on 07/14/21 ? Christina Fry  is a 81 y.o. female, with past medical history of polymyalgia rheumatica, diabetes, asthma, Graves' disease, prior PE on Eliquis, patient presents to ED secondary to fall, patient report fall happened last time, she is mostly wheelchair bound, transfers between chair/bed and wheelchair at her independent living facility, reports last night she was trying to go from wheelchair to chair, she fell back striking her head into the hard floor, as well as her left leg, she was unable to stand up, or to call for help, and laid on the floor from 8 last night until this morning when she was found by staff, and reports severe headache from where she hit her head on the floor, neck pain, left hip pain, left knee pain, she was given fentanyl by EMS, she denies any focal deficits, no fever, no chills,. ?-In ED no significant lab abnormalities, CT head with no evidence of acute findings, findings significant for left hip fracture, patient seen in consultation by Dr. Doreatha Martin, orthopedics and patient underwent surgical repair 07/14/2021.  ? ?Interim History ?Underwent surgical intervention and is doing well postoperatively.  WBC is slightly went up but trended back down and is now stable and she does have an acute blood loss anemia as expected but Hgb/Hct is now improved today.  Need to continue monitor for signs of the bleeding.  PT OT recommending SNF and from a medical standpoint she is stable to D/C to SNF with continued Therapy but [facility] ([facility] to [facility] but [facility] to [facility]).   ?  ? ? ?Assessment & Plan: ?  ?Principal Problem: ?  Closed left hip fracture ([place]) ?Active Problems: ?  Mild intermittent asthma without  complication ?  Generalized weakness ?  PMR (polymyalgia rheumatica) (HCC) ?  GERD (gastroesophageal reflux disease) ?  Fibromyalgia ?  Essential hypertension ?  Hypothyroidism ?  Ambulatory dysfunction ?  Pulmonary embolism ([place]) ?  T12 compression fracture ([place]) ?  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 ?  Vitamin B12 deficiency ?  Protein-calorie malnutrition, severe ? ?#1 left hip fracture status post cephalomedullary nailing of the left intertrochanteric femur fracture postop day 7 ?-Secondary to mechanical fall. ?-Patient seen in consultation by orthopedics and underwent surgical repair 07/14/2021 without any significant complications ?-Patient seen by PT OT who are recommending SNF. ?-Patient still with complaints of uncontrolled left hip pain, complaining of left knee pain today and feels unable to bear weight on left lower extremity. ?-Postop surgery was recommended WBAT. ?-Patient seen and followed by orthopedics and plan for CT of left lower extremity for further evaluation. ?-Plain films of the left knee due to patient complains of left knee pain. ?-Eliquis for anticoagulation. ?-Pain management per orthopedics. ?-We will need outpatient follow-up with orthopedics 2 weeks postdischarge for wound check and repeat x-rays. ? ?2.  Left shoulder pain ?-Likely secondary to arthritis. ?-Plain films of the left shoulder negative for acute fracture. ?-Continue Lidoderm patch/Voltaren gel. ? ?3.  Left knee pain ?-Orthopedics informed of patient's left knee pain and plain films of the left knee ordered. ?-Per orthopedics. ? ?4.  Hyperlipidemia ?-Statin. ? ?5.  Postop acute blood loss anemia ?-Anemia panel with  anemia of chronic disease with low iron levels of 16, TIBC of 249, sed rate shows of 6, ferritin of 35, folate of 10.6.  Vitamin B12 434. ?-Hemoglobin stable at 8.5. ?-Continue oral iron supplementation. ?-Follow H&H. ?-Transfusion threshold hemoglobin < 7. ? ?6.  CKD stage IIIa ?-Stable ?-Avoid nephrotoxins. ? ?7. history  of PE ?-Continue Eliquis. ? ?8.  Hypophosphatemia ?-Repleted. ? ?9.  Ambulatory dysfunction/dependence on wheelchair ?-Patient seen by PT/OT recommending SNF placement. ?-TOC working on [employer] approval for placement. ? ?10.  Hyponatremia ?-Improved. ? ?11.  Hypothyroidism ?-Continue Synthroid. ? ?12.  Mild leukocytosis ?-Likely reactive. ?-Patient afebrile, no signs or symptoms of infection. ?-Follow. ? ?13.  Hypertension ?-Continue home regimen Toprol-XL. ? ?14.  Hypoalbuminemia ?-Outpatient follow-up. ? ?15.  GERD ?-Continue PPI. ? ?16.  Obesity ?-Patient with an estimated BMI of 31.01 kg per metered squared. ?-Lifestyle modification. ?-Outpatient follow-up with PCP. ? ? ? ?DVT prophylaxis: Eliquis ?Code Status: DNR ?Family Communication: Updated patient.  No family at bedside ?Disposition: TBD likely SNF ? ?Status is: Inpatient ?Remains inpatient appropriate because: Severity of illness, and safe disposition ?  ?Consultants:  ?Orthopedics: Dr. Doreatha Martin 07/14/2021 ? ?Procedures:  ?CT head CT C-spine 07/14/2021 ?CT L-spine 07/14/2021 ?Chest x-ray 07/14/2021 ?Plain films of the left femur 07/14/2021 ?Plain films of the pelvis and left hip 07/14/2021 ?Plan films of the left knee 07/14/2021 ?Plain films of the left shoulder 07/20/2021 ?Cephalomedullary nailing of left intertrochanteric femur fracture per Dr. Doreatha Martin 07/14/2021 ? ? ? ?Antimicrobials:  ?Acyclovir 07/15/2021>>>> ?IV Ancef 07/14/2021>>>> 07/15/2021 ? ? ?Subjective: ?Patient laying in bed.  States left lower extremity pain not well controlled.  States when she tries to stand on the left leg seems to have difficulty doing that.  Patient also complaining of left knee pain and concerned she may have fractured her knee.  No chest pain.  No shortness of breath.  No abdominal pain.  States about to have a bowel movement. ? ?Objective: ?Vitals:  ? 07/20/21 1500 07/20/21 2012 07/21/21 0438 07/21/21 0803  ?BP: 109/64 (!) 103/40 137/63 106/75  ?Pulse: 94 86 98 99  ?Resp: '17 16 16 16  '$ ?Temp: 98.9  ?F (37.2 ?C) 98.3 ?F (36.8 ?C) 98.5 ?F (36.9 ?C) 98.6 ?F (37 ?C)  ?TempSrc: Oral Oral Oral Oral  ?SpO2: 95% 96% 95% 94%  ?Weight:      ?Height:      ? ? ?Intake/Output Summary (Last 24 hours) at 07/21/2021 1030 ?Last data filed at 07/20/2021 1700 ?Gross per 24 hour  ?Intake 240 ml  ?Output 450 ml  ?Net -210 ml  ? ?Filed Weights  ? 07/14/21 8119 07/14/21 1232  ?Weight: 79.4 kg 79.4 kg  ? ? ?Examination: ? ?General exam: Appears calm and comfortable  ?Respiratory system: Clear to auscultation. Respiratory effort normal. ?Cardiovascular system: S1 & S2 heard, RRR. No JVD, murmurs, rubs, gallops or clicks. No pedal edema. ?Gastrointestinal system: Abdomen is nondistended, soft and nontender. No organomegaly or masses felt. Normal bowel sounds heard. ?Central nervous system: Alert and oriented. No focal neurological deficits. ?Extremities: Left lower extremity with incision site with some bruising noted around it.  Left hip with some tenderness to palpation.  Left knee with some tenderness to palpation.  ?Skin: No rashes, lesions or ulcers ?Psychiatry: Judgement and insight appear normal. Mood & affect appropriate.  ? ? ? ?Data Reviewed: I have personally reviewed following labs and imaging studies ? ?CBC: ?Recent Labs  ?Lab 07/16/21 ?0231 07/17/21 ?0223 07/18/21 ?0214 07/19/21 ?1478 07/21/21 ?2956  ?  WBC 14.6* 10.5 10.7* 10.5 11.3*  ?NEUTROABS 11.3* 6.7 6.5 6.7 7.1  ?HGB 8.8* 8.2* 9.2* 8.4* 8.5*  ?HCT 27.4* 25.8* 29.3* 27.3* 27.4*  ?MCV 95.5 95.6 97.0 97.8 98.2  ?PLT 185 183 243 257 347  ? ? ?Basic Metabolic Panel: ?Recent Labs  ?Lab 07/16/21 ?0231 07/17/21 ?0223 07/18/21 ?0214 07/19/21 ?1771 07/21/21 ?1657  ?NA 131* 134* 136 135 135  ?K 4.5 3.9 4.4 4.3 4.3  ?CL 99 101 102 101 99  ?CO2 '24 27 29 29 30  '$ ?GLUCOSE 110* 103* 142* 150* 139*  ?BUN '17 18 20 18 17  '$ ?CREATININE 1.15* 1.11* 1.09* 1.02* 1.02*  ?CALCIUM 8.1* 8.1* 8.7* 8.7* 8.9  ?MG 1.8 1.8 2.3 1.9 1.9  ?PHOS 2.4* 2.6 3.2 3.5 4.4  ? ? ?GFR: ?Estimated Creatinine  Clearance: 41.7 mL/min (A) (by C-G formula based on SCr of 1.02 mg/dL (H)). ? ?Liver Function Tests: ?Recent Labs  ?Lab 07/16/21 ?0231 07/17/21 ?0223 07/18/21 ?0214 07/19/21 ?9038 07/21/21 ?3338  ?AST 14* 15

## 2021-07-21 NOTE — Progress Notes (Signed)
Nutrition Follow-up ? ?DOCUMENTATION CODES:  ? ?Severe malnutrition in context of acute illness/injury ? ?INTERVENTION:  ? ?Continue to offer Ensure Enlive po BID, each supplement provides 350 kcal and 20 grams of protein. ? ?Continue MVI with minerals daily. ? ?NUTRITION DIAGNOSIS:  ? ?Severe Malnutrition related to acute illness (recent back surgery) as evidenced by moderate muscle depletion, moderate fat depletion, severe muscle depletion. ? ?Ongoing  ? ?GOAL:  ? ?Patient will meet greater than or equal to 90% of their needs ? ?Progressing  ? ?MONITOR:  ? ?PO intake, Supplement acceptance ? ?REASON FOR ASSESSMENT:  ? ?Consult ?Hip fracture protocol ? ?ASSESSMENT:  ? ?81 yo female admitted with L hip fracture s/p fall. PMH includes polymyalgia rheumatica, DM, asthma, Grave's disease, fibromyalgia, GERD, HLD. ? ?S/P surgical repair of L femur fracture on 5/3. ? ?Patient sleeping during RD visit, RD did not awaken. ?She remains on a regular diet; meal intakes documented at 0-100%. ?She has been drinking Ensure supplements when they are available. ?Per RN documentation, she has been drinking 1 Ensure per day 5/8 and 5/9, none today.  ? ?Labs reviewed. ?Medications reviewed and include cholecalciferol, Niferex, MVI with minerals, Miralax. ?  ?Diet Order:   ?Diet Order   ? ?       ?  Diet regular Room service appropriate? Yes with Assist; Fluid consistency: Thin  Diet effective now       ?  ? ?  ?  ? ?  ? ? ?EDUCATION NEEDS:  ? ?Education needs have been addressed ? ?Skin:  Skin Assessment: Reviewed RN Assessment ? ?Last BM:  5/8 type 3 ? ?Height:  ? ?Ht Readings from Last 1 Encounters:  ?07/14/21 '5\' 3"'$  (1.6 m)  ? ? ?Weight:  ? ?Wt Readings from Last 1 Encounters:  ?07/14/21 79.4 kg  ? ? ? ?BMI:  Body mass index is 31.01 kg/m?. ? ?Estimated Nutritional Needs:  ? ?Kcal:  1800-2000 ? ?Protein:  90-100 gm ? ?Fluid:  1.8-2 L ? ? ? ?Lucas Mallow RD, LDN, CNSC ?Please refer to Amion for contact information.                                                        ? ?

## 2021-07-22 DIAGNOSIS — I10 Essential (primary) hypertension: Secondary | ICD-10-CM | POA: Diagnosis not present

## 2021-07-22 DIAGNOSIS — Z993 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: Secondary | ICD-10-CM | POA: Diagnosis not present

## 2021-07-22 DIAGNOSIS — S72002P Fracture of unspecified part of neck of left femur, subsequent encounter for closed fracture with malunion: Secondary | ICD-10-CM | POA: Diagnosis not present

## 2021-07-22 DIAGNOSIS — R262 Difficulty in walking, not elsewhere classified: Secondary | ICD-10-CM | POA: Diagnosis not present

## 2021-07-22 LAB — BASIC METABOLIC PANEL
Anion gap: 5 (ref 5–15)
BUN: 16 mg/dL (ref 8–23)
CO2: 30 mmol/L (ref 22–32)
Calcium: 8.8 mg/dL — ABNORMAL LOW (ref 8.9–10.3)
Chloride: 103 mmol/L (ref 98–111)
Creatinine, Ser: 1.1 mg/dL — ABNORMAL HIGH (ref 0.44–1.00)
GFR, Estimated: 50 mL/min — ABNORMAL LOW (ref >60)
Glucose, Bld: 106 mg/dL — ABNORMAL HIGH (ref 70–99)
Potassium: 4.3 mmol/L (ref 3.5–5.1)
Sodium: 138 mmol/L (ref 135–145)

## 2021-07-22 LAB — CBC
HCT: 27.8 % — ABNORMAL LOW (ref 36.0–46.0)
Hemoglobin: 8.4 g/dL — ABNORMAL LOW (ref 12.0–15.0)
MCH: 29.9 pg (ref 26.0–34.0)
MCHC: 30.2 g/dL (ref 30.0–36.0)
MCV: 98.9 fL (ref 80.0–100.0)
Platelets: 389 10*3/uL (ref 150–400)
RBC: 2.81 MIL/uL — ABNORMAL LOW (ref 3.87–5.11)
RDW: 15.8 % — ABNORMAL HIGH (ref 11.5–15.5)
WBC: 10.3 10*3/uL (ref 4.0–10.5)
nRBC: 0 % (ref 0.0–0.2)

## 2021-07-22 MED ORDER — DICLOFENAC SODIUM 1 % EX GEL: 2.0000 g | Freq: Four times a day (QID) | CUTANEOUS | 1 refills | Status: AC

## 2021-07-22 NOTE — Plan of Care (Signed)

## 2021-07-22 NOTE — Progress Notes (Signed)
?PROGRESS NOTE ? ? ? ?Christina Fry  TML:[id number] DOB: 04-19-1940 DOA: 07/14/2021 ?PCP: Ferdie Ping, MD  ? ? ?Chief Complaint  ?Patient presents with  ? Fall  ? ? ?Brief Narrative:  ?HPI per Dr. Phillips Climes on 07/14/21 ? Christina Fry  is a 81 y.o. female, with past medical history of polymyalgia rheumatica, diabetes, asthma, Graves' disease, prior PE on Eliquis, patient presents to ED secondary to fall, patient report fall happened last time, she is mostly wheelchair bound, transfers between chair/bed and wheelchair at her independent living facility, reports last night she was trying to go from wheelchair to chair, she fell back striking her head into the hard floor, as well as her left leg, she was unable to stand up, or to call for help, and laid on the floor from 8 last night until this morning when she was found by staff, and reports severe headache from where she hit her head on the floor, neck pain, left hip pain, left knee pain, she was given fentanyl by EMS, she denies any focal deficits, no fever, no chills,. ?-In ED no significant lab abnormalities, CT head with no evidence of acute findings, findings significant for left hip fracture, patient seen in consultation by Dr. Doreatha Martin, orthopedics and patient underwent surgical repair 07/14/2021.  ? ?Interim History ?Underwent surgical intervention and is doing well postoperatively.  WBC is slightly went up but trended back down and is now stable and she does have an acute blood loss anemia as expected but Hgb/Hct is now improved today.  Need to continue monitor for signs of the bleeding.  PT OT recommending SNF and from a medical standpoint she is stable to D/C to SNF with continued Therapy but [facility] ([facility] to [facility] but was [facility] to the [facility]).   ?  ? ? ?Assessment & Plan: ?  ?Principal Problem: ?  Closed left hip fracture ([place]) ?Active Problems: ?  Mild intermittent asthma without  complication ?  Generalized weakness ?  PMR (polymyalgia rheumatica) (HCC) ?  GERD (gastroesophageal reflux disease) ?  Fibromyalgia ?  Essential hypertension ?  Hypothyroidism ?  Ambulatory dysfunction ?  Pulmonary embolism ([place]) ?  T12 compression fracture ([place]) ?  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 ?  Vitamin B12 deficiency ?  Protein-calorie malnutrition, severe ?  Acute pain of left knee ? ?#1 left hip fracture status post cephalomedullary nailing of the left intertrochanteric femur fracture postop day 7 ?-Secondary to mechanical fall. ?-Patient seen in consultation by orthopedics and underwent surgical repair 07/14/2021 without any significant complications ?-Patient seen by PT OT who are recommending SNF. ?-Patient still with complaints of uncontrolled left hip pain, complaining of left knee pain today and feels unable to bear weight on left lower extremity. ?-Postop surgery was recommended WBAT. ?-Patient seen and followed by orthopedics and patient underwent CT of the left knee which was negative for any acute abnormalities but consistent with osteoarthritis.   ?-Eliquis for anticoagulation. ?-Pain management per orthopedics. ?-We will need outpatient follow-up with orthopedics 2 weeks postdischarge for wound check and repeat x-rays. ? ?2.  Left shoulder pain ?-Likely secondary to arthritis. ?-Plain films of the left shoulder negative for acute fracture. ?-Continue Lidoderm patch/Voltaren gel. ? ?3.  Left knee pain ?-Orthopedics informed of patient's left knee pain. ?-CT left knee negative for any acute abnormalities however consistent with tricompartmental osteoarthritis with moderate to severe in the medial compartment.   ?-  Continue Voltaren gel.   ?-Outpatient follow-up with orthopedics and if no significant improvement at that time may consider steroid injections.   ?-Per orthopedics. ? ?4.  Hyperlipidemia ?-Statin. ? ?5.  Postop acute blood loss anemia ?-Anemia panel with anemia of chronic disease with low iron  levels of 16, TIBC of 249, sed rate shows of 6, ferritin of 35, folate of 10.6.  Vitamin B12 434. ?-Hemoglobin stable at 8.4 ?-Continue oral iron supplementation. ?-Follow H&H. ?-Transfusion threshold hemoglobin < 7. ? ?6.  CKD stage IIIa ?-Stable ?-Avoid nephrotoxins. ? ?7. history of PE ?-Continue Eliquis.   ? ?8.  Hypophosphatemia ?-Repleted. ? ?9.  Ambulatory dysfunction/dependence on wheelchair ?-Patient seen by PT/OT recommending SNF placement. ?-TOC working on [employer] approval for placement. ? ?10.  Hyponatremia ?-Improved. ? ?11.  Hypothyroidism ?-Continue Synthroid. ? ?12.  Mild leukocytosis ?-Likely reactive. ?-Patient afebrile, no signs or symptoms of infection. ?-Leukocytosis trending down. ?-Follow. ? ?13.  Hypertension ?-Continue home regimen Toprol-XL. ? ?14.  Hypoalbuminemia ?-Outpatient follow-up. ? ?15.  GERD ?-PPI.   ? ?16.  Obesity ?-Patient with an estimated BMI of 31.01 kg per metered squared. ?-Lifestyle modification. ?-Outpatient follow-up with PCP. ? ? ? ?DVT prophylaxis: Eliquis ?Code Status: DNR ?Family Communication: Updated patient.  No family at bedside ?Disposition: SNF when bed available. ? ?Status is: Inpatient ?Remains inpatient appropriate because: Severity of illness, and safe disposition ?  ?Consultants:  ?Orthopedics: Dr. Doreatha Martin 07/14/2021 ? ?Procedures:  ?CT head CT C-spine 07/14/2021 ?CT L-spine 07/14/2021 ?Chest x-ray 07/14/2021 ?Plain films of the left femur 07/14/2021 ?Plain films of the pelvis and left hip 07/14/2021 ?Plan films of the left knee 07/14/2021 ?Plain films of the left shoulder 07/20/2021 ?Cephalomedullary nailing of left intertrochanteric femur fracture per Dr. Doreatha Martin 07/14/2021. ?CT left knee 07/21/2021 ? ? ? ?Antimicrobials:  ?Acyclovir 07/15/2021>>>> ?IV Ancef 07/14/2021>>>> 07/15/2021 ? ? ?Subjective: ?Patient laying in bed.  Still with some left hip pain however some improvement.  States some improvement with left knee pain.  No chest pain.  No shortness of breath.  No abdominal pain.    ? ?Objective: ?Vitals:  ? 07/21/21 0803 07/21/21 1522 07/21/21 2021 07/22/21 0729  ?BP: 106/75 (!) 123/46 (!) 111/55 (!) 106/45  ?Pulse: 99 88 83 89  ?Resp: 16  16   ?Temp: 98.6 ?F (37 ?C) 98.6 ?F (37 ?C) 98.3 ?F (36.8 ?C) 98 ?F (36.7 ?C)  ?TempSrc: Oral Oral Oral   ?SpO2: 94% 96% 97% 93%  ?Weight:      ?Height:      ? ?No intake or output data in the 24 hours ending 07/22/21 1141 ? ?Filed Weights  ? 07/14/21 8889 07/14/21 1232  ?Weight: 79.4 kg 79.4 kg  ? ? ?Examination: ? ?General exam: NAD. ?Respiratory system: CTA B.  No wheezes, no crackles, no rhonchi.  Fair air movement.  Speaking in full sentences.  ?Cardiovascular system: Regular rate and rhythm no murmurs rubs or gallops.  No JVD.  No lower extremity edema.   ?Gastrointestinal system: Abdomen is soft, nontender, nondistended, positive bowel sounds.  No rebound.  No guarding.  ?Central nervous system: Alert and oriented. No focal neurological deficits. ?Extremities: Left lower extremity with incision site with some bruising noted around it.  Left hip with some tenderness to palpation.  Left knee with some tenderness to palpation.  ?Skin: No rashes, lesions or ulcers ?Psychiatry: Judgement and insight appear normal. Mood & affect appropriate.  ? ? ? ?Data Reviewed: I have personally reviewed following labs and imaging studies ? ?  CBC: ?Recent Labs  ?Lab 07/16/21 ?0231 07/17/21 ?0223 07/18/21 ?0214 07/19/21 ?5681 07/21/21 ?2751 07/22/21 ?0259  ?WBC 14.6* 10.5 10.7* 10.5 11.3* 10.3  ?NEUTROABS 11.3* 6.7 6.5 6.7 7.1  --   ?HGB 8.8* 8.2* 9.2* 8.4* 8.5* 8.4*  ?HCT 27.4* 25.8* 29.3* 27.3* 27.4* 27.8*  ?MCV 95.5 95.6 97.0 97.8 98.2 98.9  ?PLT 185 183 243 257 347 389  ? ? ? ?Basic Metabolic Panel: ?Recent Labs  ?Lab 07/16/21 ?0231 07/17/21 ?0223 07/18/21 ?0214 07/19/21 ?7001 07/21/21 ?7494 07/22/21 ?0259  ?NA 131* 134* 136 135 135 138  ?K 4.5 3.9 4.4 4.3 4.3 4.3  ?CL 99 101 102 101 99 103  ?CO2 '24 27 29 29 30 30  '$ ?GLUCOSE 110* 103* 142* 150* 139* 106*  ?BUN '17 18  20 18 17 16  '$ ?CREATININE 1.15* 1.11* 1.09* 1.02* 1.02* 1.10*  ?CALCIUM 8.1* 8.1* 8.7* 8.7* 8.9 8.8*  ?MG 1.8 1.8 2.3 1.9 1.9  --   ?PHOS 2.4* 2.6 3.2 3.5 4.4  --   ? ? ? ?GFR: ?Estimated Creatinine Cle

## 2021-07-22 NOTE — TOC Progression Note (Signed)
Transition of Care (TOC) - Progression Note  ? ? ?Patient Details  ?Name: Christina Fry ?MRN: [id number] ?Date of Birth: 1940-08-25 ? ?Transition of Care (TOC) CM/SW Contact  ?Joanne Chars, LCSW ?Phone Number: ?07/22/2021, 8:55 AM ? ?Clinical Narrative:   Email sent to [place] asking for update and assistance with contacting SNF options. ? ?CSW was able to get new fax numbers for these [facility] SNFs: ? ?[facility]/[place] ?Arts development officer. ? ?Referral faxed to both locations again, left message with admissions staff. ? ? ? ?Expected Discharge Plan: [place] (SNF) ?Barriers to Discharge: Continued Medical Work up ? ?Expected Discharge Plan and Services ?Expected Discharge Plan: [place] (SNF) ?  ?Discharge Planning Services: CM Consult ?  ?Living arrangements for the past 2 months: Apartment ?                ?  ?  ?  ?  ?  ?  ?  ?  ?  ?  ? ? ?Social Determinants of Health (SDOH) Interventions ?  ? ?Readmission Risk Interventions ? ?  08/28/2020  ?  1:48 PM 12/06/2019  ?  2:10 PM  ?Readmission Risk Prevention Plan  ?Transportation Screening Complete Complete  ?[place] or Home Care Consult Complete   ?Social Work Consult for [place] Planning/Counseling Complete Complete  ?Palliative Care Screening Not Applicable   ?Medication Review Press photographer) Complete Complete  ? ? ?

## 2021-07-22 NOTE — Progress Notes (Signed)
Reviewed CT scan left knee done 07/21/21. No occult fractures noted. Patient does have significant arthritis, particularly in the medial joint space.  She continues to have tenderness in this area on exam.  Would recommend continuing with Voltaren gel applied 4 times daily to the left knee.  We will plan to re-evaluate patient on outpatient basis after discharge, if she continues have significant pain in the left knee we can discuss intra-articular steroid injection.  Patient in agreement to plan. ? ?Gwinda Passe PA-C ?[facility] ?[phone] (office) ?[email or web address] ? ?

## 2021-07-22 NOTE — Progress Notes (Signed)
Occupational Therapy Treatment ?Patient Details ?Name: Christina Fry ?MRN: [id number] ?DOB: 01-17-1941 ?Today's Date: 07/22/2021 ? ? ?History of present illness 81 y.o. female admitted from [place] on 07/14/21 after fall when trying to go from w/c to chair and striking her head on floor, found down by staff the next morning. Head/cervical CT negative for acute abnormality. Workup for L intertrochanteric femur fx s/p cephalomedullary nailing 5/3. PMH includes DM, fibromyalgia, kyphoplasty (04/2021), HLD, anxiety, cancer. ?  ?OT comments ? Pt particular about direction of session and adamant that she cannot/will not stand (squat pivots to/from w/c baseline). However, once educated on trial of sliding board to prevent full WB/pain, pt eager to participate and did well. Pt overall Min A for sliding board transfer to/from wheelchair with cues for techniques/safety. Once in wheelchair, pt able to independently propel self into bathroom and excitedly completed various grooming tasks. Pt eager to trial toilet transfers (from w/c) in next session and spend more time up in her chair.   ? ?Recommendations for follow up therapy are one component of a multi-disciplinary discharge planning process, led by the attending physician.  Recommendations may be updated based on patient status, additional functional criteria and insurance authorization. ?   ?Follow Up Recommendations ? Skilled nursing-short term rehab (<3 hours/day)  ?  ?Assistance Recommended at Discharge Intermittent Supervision/Assistance  ?Patient can return home with the following ? A lot of help with bathing/dressing/bathroom;Assist for transportation ?  ?Equipment Recommendations ? Hospital bed  ?  ?Recommendations for Other Services   ? ?  ?Precautions / Restrictions Precautions ?Precautions: Fall;Other (comment) ?Precaution Comments: urine incontinence/ TLSO brace in room (pt has been wearing brace since kyphoplasty 04/2021 but states she can't put it on now  with her hip pain) ?Restrictions ?Weight Bearing Restrictions: Yes ?LLE Weight Bearing: Weight bearing as tolerated  ? ? ?  ? ?Mobility Bed Mobility ?Overal bed mobility: Needs Assistance ?Bed Mobility: Supine to Sit, Sit to Supine ?  ?  ?Supine to sit: Mod assist, HOB elevated ?Sit to supine: Mod assist ?  ?General bed mobility comments: pt instructing OT to "grab behind the knees" to bring LEs OOB and back into bed, able to use bedrail to assist and scoot forward ?  ? ?Transfers ?Overall transfer level: Needs assistance ?Equipment used: Sliding board ?Transfers: Bed to chair/wheelchair/BSC ?  ?  ?  ?  ?  ? Lateral/Scoot Transfers: Min assist ?General transfer comment: Instructed in sliding board with excellent carryover, Min A to scoot/advance hips and steady board to/from personal wheelchair. Pt able to place some weight through BLE to assist with transfers and board placement ?  ?  ?Balance Overall balance assessment: Needs assistance ?Sitting-balance support: No upper extremity supported, Feet supported ?Sitting balance-Leahy Scale: Good ?  ?  ?  ?  ?  ?  ?  ?  ?  ?  ?  ?  ?  ?  ?  ?  ?   ? ?ADL either performed or assessed with clinical judgement  ? ?ADL Overall ADL's : Needs assistance/impaired ?  ?  ?Grooming: Modified independent;Sitting;Oral care;Wash/dry face;Brushing hair;Applying deodorant ?Grooming Details (indicate cue type and reason): various grooming tasks completed from w;/c level seated at sink in bathroom ?  ?  ?  ?  ?  ?  ?Lower Body Dressing: Maximal assistance;Sitting/lateral leans;Bed level ?  ?  ?  ?  ?  ?  ?  ?  ?General ADL Comments: Focus on sliding board transfer training  with pt excited about this option to avoid excessive WB/pain in B LE. Once in wheelchair, pt able to [facility] around room for basic ADLs well. ?  ? ?Extremity/Trunk Assessment Upper Extremity Assessment ?Upper Extremity Assessment: Generalized weakness ?  ?Lower Extremity Assessment ?Lower Extremity Assessment: Defer to  PT evaluation ?  ?  ?  ? ?Vision   ?Vision Assessment?: No apparent visual deficits ?  ?Perception   ?  ?Praxis   ?  ? ?Cognition Arousal/Alertness: Awake/alert ?Behavior During Therapy: [facility] for tasks assessed/performed ?Overall Cognitive Status: No family/caregiver present to determine baseline cognitive functioning ?Area of Impairment: Memory, Safety/judgement, Awareness, Problem solving ?  ?  ?  ?  ?  ?  ?  ?  ?  ?  ?Memory: Decreased short-term memory ?Following Commands: Follows one step commands with increased time, Follows multi-step commands inconsistently ?Safety/Judgement: Decreased awareness of safety, Decreased awareness of deficits ?Awareness: Emergent ?Problem Solving: Requires verbal cues ?General Comments: mild memory deficits noted. very particular about sequencing and directing staff throughout session though easily agitated when asked how pt plans to complete tasks at home, with rehab, etc ?  ?  ?   ?Exercises   ? ?  ?Shoulder Instructions   ? ? ?  ?General Comments HR WFL  ? ? ?Pertinent Vitals/ Pain       Pain Assessment ?Pain Assessment: Faces ?Faces Pain Scale: Hurts little more ?Pain Location: LLE ?Pain Descriptors / Indicators: Grimacing, Guarding ?Pain Intervention(s): Monitored during session, Premedicated before session ? ?Home Living   ?  ?  ?  ?  ?  ?  ?  ?  ?  ?  ?  ?  ?  ?  ?  ?  ?  ?  ? ?  ?Prior Functioning/Environment    ?  ?  ?  ?   ? ?Frequency ? Min 2X/week  ? ? ? ? ?  ?Progress Toward Goals ? ?OT Goals(current goals can now be found in the care plan section) ? Progress towards OT goals: Progressing toward goals ? ?Acute Rehab OT Goals ?Patient Stated Goal: continue using sliding board, get up into wheelchair more often ?OT Goal Formulation: With patient ?Time For Goal Achievement: 07/30/21 ?Potential to Achieve Goals: Good ?ADL Goals ?Pt Will Transfer to Toilet: with mod assist;bedside commode;squat pivot transfer ?Additional ADL Goal #1: pt will don doff back brace MOD I prior  to all transfers ?Additional ADL Goal #2: Pt will complete bed mobility mod (A) as precursor to transfers  ?Plan Discharge plan remains appropriate   ? ?Co-evaluation ? ? ?   ?  ?  ?  ?  ? ?  ?AM-PAC OT "6 Clicks" Daily Activity     ?Outcome Measure ? ? Help from another person eating meals?: None ?Help from another person taking care of personal grooming?: None ?Help from another person toileting, which includes using toliet, bedpan, or urinal?: A Lot ?Help from another person bathing (including washing, rinsing, drying)?: A Lot ?Help from another person to put on and taking off regular upper body clothing?: A Little ?Help from another person to put on and taking off regular lower body clothing?: A Lot ?6 Click Score: 17 ? ?  ?End of Session Equipment Utilized During Treatment: Other (comment) (sliding board, personal wheelchair) ? ?OT Visit Diagnosis: Unsteadiness on feet (R26.81);Repeated falls (R29.6) ?  ?Activity Tolerance Patient tolerated treatment well ?  ?Patient Left in bed;with call bell/phone within reach;with bed alarm set ?  ?Nurse Communication  Mobility status ?  ? ?   ? ?Time: 8006-3494 ?OT Time Calculation (min): 44 min ? ?Charges: OT General Charges ?$OT Visit: 1 Visit ?OT Treatments ?$Self Care/Home Management : 8-22 mins ?$Therapeutic Activity: 23-37 mins ? ?Malachy Chamber, OTR/L ?Acute Rehab Services ?Office: [phone]  ? ?Christina Fry ?07/22/2021, 11:06 AM ?

## 2021-07-22 NOTE — Progress Notes (Signed)
OT Cancellation Note ? ?Patient Details ?Name: Micaela Stith Edwards ?MRN: [id number] ?DOB: 03-16-1940 ? ? ?Cancelled Treatment:    Reason Eval/Treat Not Completed: Patient declined, no reason specified Coordinated with RN for pain premedication (though pt reports that wont help). Pt reports still working on breakfast and requests OT to check back in 20 min. ? ?Layla Maw ?07/22/2021, 9:22 AM ?

## 2021-07-23 DIAGNOSIS — Z993 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: Secondary | ICD-10-CM | POA: Diagnosis not present

## 2021-07-23 DIAGNOSIS — I10 Essential (primary) hypertension: Secondary | ICD-10-CM | POA: Diagnosis not present

## 2021-07-23 DIAGNOSIS — R262 Difficulty in walking, not elsewhere classified: Secondary | ICD-10-CM | POA: Diagnosis not present

## 2021-07-23 DIAGNOSIS — S72002P Fracture of unspecified part of neck of left femur, subsequent encounter for closed fracture with malunion: Secondary | ICD-10-CM | POA: Diagnosis not present

## 2021-07-23 LAB — CBC WITH DIFFERENTIAL/PLATELET
Abs Immature Granulocytes: 0.07 10*3/uL (ref 0.00–0.07)
Basophils Absolute: 0.1 10*3/uL (ref 0.0–0.1)
Basophils Relative: 1 %
Eosinophils Absolute: 0.4 10*3/uL (ref 0.0–0.5)
Eosinophils Relative: 4 %
HCT: 28.2 % — ABNORMAL LOW (ref 36.0–46.0)
Hemoglobin: 8.5 g/dL — ABNORMAL LOW (ref 12.0–15.0)
Immature Granulocytes: 1 %
Lymphocytes Relative: 27 %
Lymphs Abs: 2.6 10*3/uL (ref 0.7–4.0)
MCH: 30.4 pg (ref 26.0–34.0)
MCHC: 30.1 g/dL (ref 30.0–36.0)
MCV: 100.7 fL — ABNORMAL HIGH (ref 80.0–100.0)
Monocytes Absolute: 1 10*3/uL (ref 0.1–1.0)
Monocytes Relative: 11 %
Neutro Abs: 5.6 10*3/uL (ref 1.7–7.7)
Neutrophils Relative %: 56 %
Platelets: 420 10*3/uL — ABNORMAL HIGH (ref 150–400)
RBC: 2.8 MIL/uL — ABNORMAL LOW (ref 3.87–5.11)
RDW: 15.9 % — ABNORMAL HIGH (ref 11.5–15.5)
WBC: 9.7 10*3/uL (ref 4.0–10.5)
nRBC: 0 % (ref 0.0–0.2)

## 2021-07-23 LAB — BASIC METABOLIC PANEL
Anion gap: 8 (ref 5–15)
BUN: 19 mg/dL (ref 8–23)
CO2: 28 mmol/L (ref 22–32)
Calcium: 8.7 mg/dL — ABNORMAL LOW (ref 8.9–10.3)
Chloride: 101 mmol/L (ref 98–111)
Creatinine, Ser: 1.12 mg/dL — ABNORMAL HIGH (ref 0.44–1.00)
GFR, Estimated: 49 mL/min — ABNORMAL LOW (ref >60)
Glucose, Bld: 110 mg/dL — ABNORMAL HIGH (ref 70–99)
Potassium: 4 mmol/L (ref 3.5–5.1)
Sodium: 137 mmol/L (ref 135–145)

## 2021-07-23 NOTE — Progress Notes (Signed)
?PROGRESS NOTE ? ? ? ?Christina Fry  ZOX:[id number] DOB: [birth year] DOA: 07/14/2021 ?PCP: Ferdie Ping, MD  ? ? ?Chief Complaint  ?Patient presents with  ? Fall  ? ? ?Brief Narrative:  ?HPI per Dr. Phillips Climes on 07/14/21 ? Christina Fry  is a 81 y.o. female, with past medical history of polymyalgia rheumatica, diabetes, asthma, Graves' disease, prior PE on Eliquis, patient presents to ED secondary to fall, patient report fall happened last time, she is mostly wheelchair bound, transfers between chair/bed and wheelchair at her independent living facility, reports last night she was trying to go from wheelchair to chair, she fell back striking her head into the hard floor, as well as her left leg, she was unable to stand up, or to call for help, and laid on the floor from 8 last night until this morning when she was found by staff, and reports severe headache from where she hit her head on the floor, neck pain, left hip pain, left knee pain, she was given fentanyl by EMS, she denies any focal deficits, no fever, no chills,. ?-In ED no significant lab abnormalities, CT head with no evidence of acute findings, findings significant for left hip fracture, patient seen in consultation by Dr. Doreatha Martin, orthopedics and patient underwent surgical repair 07/14/2021.  ? ?Interim History ?Underwent surgical intervention and is doing well postoperatively.  WBC is slightly went up but trended back down and is now stable and she does have an acute blood loss anemia as expected but Hgb/Hct is now improved today.  Need to continue monitor for signs of the bleeding.  PT OT recommending SNF and from a medical standpoint she is stable to D/C to SNF with continued Therapy but [facility] ([facility] to [facility] but was [facility] to the [facility]).   ?  ? ? ?Assessment & Plan: ?  ?Principal Problem: ?  Closed left hip fracture ([place]) ?Active Problems: ?  Mild intermittent asthma without  complication ?  Generalized weakness ?  PMR (polymyalgia rheumatica) (HCC) ?  GERD (gastroesophageal reflux disease) ?  Fibromyalgia ?  Essential hypertension ?  Hypothyroidism ?  Ambulatory dysfunction ?  Pulmonary embolism ([place]) ?  T12 compression fracture ([place]) ?  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 ?  Vitamin B12 deficiency ?  Protein-calorie malnutrition, severe ?  Acute pain of left knee ? ?#1 left hip fracture status post cephalomedullary nailing of the left intertrochanteric femur fracture postop day 7 ?-Secondary to mechanical fall. ?-Patient seen in consultation by orthopedics and underwent surgical repair 07/14/2021 without any significant complications ?-Patient seen by PT OT who are recommending SNF. ?-Patient still with complaints of uncontrolled left hip pain, complaining of left knee pain today and feels unable to bear weight on left lower extremity. ?-Postop surgery was recommended WBAT. ?-Patient seen and followed by orthopedics and patient underwent CT of the left knee which was negative for any acute abnormalities but consistent with osteoarthritis.   ?-Eliquis for anticoagulation. ?-Pain management per orthopedics. ?-Will need outpatient follow-up with orthopedics 2 weeks postdischarge for wound check and repeat x-rays. ? ?2.  Left shoulder pain ?-Likely secondary to arthritis. ?-Plain films of the left shoulder negative for acute fracture. ?-Continue Lidoderm patch/Voltaren gel. ? ?3.  Left knee pain ?-Orthopedics informed of patient's left knee pain. ?-CT left knee negative for any acute abnormalities however consistent with tricompartmental osteoarthritis with moderate to severe in the medial compartment.   ?-Patient  states improvement of left knee pain with Voltaren gel. ?-Continue Voltaren gel.   ?-Outpatient follow-up with orthopedics and if no significant improvement at that time may consider steroid injections.   ?-Per orthopedics. ? ?4.  Hyperlipidemia ?-Statin. ? ?5.  Postop acute blood loss  anemia ?-Anemia panel with anemia of chronic disease with low iron levels of 16, TIBC of 249, sed rate shows of 6, ferritin of 35, folate of 10.6.  Vitamin B12 434. ?-Hemoglobin stable at 8.5. ?-Continue oral iron supplementation. ?-Follow H&H. ?-Transfusion threshold hemoglobin < 7. ? ?6.  CKD stage IIIa ?-Stable ?-Avoid nephrotoxins. ? ?7. history of PE ?-Eliquis. ? ?8.  Hypophosphatemia ?-Repleted. ? ?9.  Ambulatory dysfunction/dependence on wheelchair ?-Patient seen by PT/OT recommending SNF placement. ?-TOC working on [employer] approval for placement. ? ?10.  Hyponatremia ?-Improved. ? ?11.  Hypothyroidism ?-Synthroid. ? ?12.  Mild leukocytosis ?-Likely reactive. ?-Patient afebrile, no signs or symptoms of infection. ?-Leukocytosis trending down. ?-Follow. ? ?13.  Hypertension ?-Toprol-XL.  ? ?14.  Hypoalbuminemia ?-Outpatient follow-up. ? ?15.  GERD ?-Continue PPI. ? ?16.  Obesity ?-Patient with an estimated BMI of 31.01 kg per metered squared. ?-Lifestyle modification. ?-Outpatient follow-up with PCP. ? ? ? ?DVT prophylaxis: Eliquis ?Code Status: DNR ?Family Communication: Updated patient.  No family at bedside ?Disposition: SNF when bed available. ? ?Status is: Inpatient ?Remains inpatient appropriate because: Severity of illness, and safe disposition ?  ?Consultants:  ?Orthopedics: Dr. Doreatha Martin 07/14/2021 ? ?Procedures:  ?CT head CT C-spine 07/14/2021 ?CT L-spine 07/14/2021 ?Chest x-ray 07/14/2021 ?Plain films of the left femur 07/14/2021 ?Plain films of the pelvis and left hip 07/14/2021 ?Plan films of the left knee 07/14/2021 ?Plain films of the left shoulder 07/20/2021 ?Cephalomedullary nailing of left intertrochanteric femur fracture per Dr. Doreatha Martin 07/14/2021. ?CT left knee 07/21/2021 ? ? ? ?Antimicrobials:  ?Acyclovir 07/15/2021>>>> ?IV Ancef 07/14/2021>>>> 07/15/2021 ? ? ?Subjective: ?Laying in bed.  Alert.  Asking for PureWick to be changed.  No chest pain.  No shortness of breath.  No abdominal pain.  States Voltaren gel helping  with left knee pain.  Tolerating diet.  ? ?Objective: ?Vitals:  ? 07/22/21 1531 07/22/21 2017 07/23/21 0620 07/23/21 0817  ?BP: (!) 123/52 (!) 114/56 (!) 121/53 (!) 113/44  ?Pulse: 89 84 87 89  ?Resp: '17 20 17   '$ ?Temp: 98.8 ?F (37.1 ?C) 98.4 ?F (36.9 ?C) 98.2 ?F (36.8 ?C) 99.1 ?F (37.3 ?C)  ?TempSrc: Oral Oral Oral Oral  ?SpO2: 96% 96% 94% 94%  ?Weight:      ?Height:      ? ? ?Intake/Output Summary (Last 24 hours) at 07/23/2021 1029 ?Last data filed at 07/23/2021 2202 ?Gross per 24 hour  ?Intake 240 ml  ?Output --  ?Net 240 ml  ? ? ?Filed Weights  ? 07/14/21 5427 07/14/21 1232  ?Weight: 79.4 kg 79.4 kg  ? ? ?Examination: ? ?General exam: NAD. ?Respiratory system: Lungs clear to auscultation bilaterally.  No wheezes, no crackles, no rhonchi.  Fair air movement.  Speaking in full sentences.   ?Cardiovascular system: RRR no murmurs rubs or gallops.  No JVD.  No lower extremity edema. ?Gastrointestinal system: Abdomen is soft, nontender, nondistended, positive bowel sounds.  No rebound.  No guarding.   ?Central nervous system: Alert and oriented. No focal neurological deficits. ?Extremities: Left lower extremity with incision site with some bruising noted around it.  Left hip with less tenderness to palpation.  Left knee with less tenderness to palpation.  ?Skin: No rashes, lesions or ulcers ?Psychiatry:  Judgement and insight appear normal. Mood & affect appropriate.  ? ? ? ?Data Reviewed: I have personally reviewed following labs and imaging studies ? ?CBC: ?Recent Labs  ?Lab 07/17/21 ?0223 07/18/21 ?0214 07/19/21 ?0721 07/21/21 ?8288 07/22/21 ?0259 07/23/21 ?0424  ?WBC 10.5 10.7* 10.5 11.3* 10.3 9.7  ?NEUTROABS 6.7 6.5 6.7 7.1  --  5.6  ?HGB 8.2* 9.2* 8.4* 8.5* 8.4* 8.5*  ?HCT 25.8* 29.3* 27.3* 27.4* 27.8* 28.2*  ?MCV 95.6 97.0 97.8 98.2 98.9 100.7*  ?PLT 183 243 257 347 389 420*  ? ? ? ?Basic Metabolic Panel: ?Recent Labs  ?Lab 07/17/21 ?0223 07/18/21 ?0214 07/19/21 ?3374 07/21/21 ?4514 07/22/21 ?0259 07/23/21 ?0424   ?NA 134* 136 135 135 138 137  ?K 3.9 4.4 4.3 4.3 4.3 4.0  ?CL 101 102 101 99 103 101  ?CO2 '27 29 29 30 30 28  '$ ?GLUCOSE 103* 142* 150* 139* 106* 110*  ?BUN '18 20 18 17 16 19  '$ ?CREATININE 1.11* 1.09* 1.02* 1.02*

## 2021-07-23 NOTE — Progress Notes (Signed)
Physical Therapy Treatment ?Patient Details ?Name: Christina Fry ?MRN: [id number] ?DOB: [birth year] ?Today's Date: 07/23/2021 ? ? ?History of Present Illness 81 y.o. female admitted from [facility] on 07/14/21 after fall when trying to go from w/c to chair and striking her head on floor, found down by staff the next morning. Head/cervical CT negative for acute abnormality. Workup for L intertrochanteric femur fx s/p cephalomedullary nailing 5/3. PMH includes DM, fibromyalgia, kyphoplasty (04/2021), HLD, anxiety, cancer. ? ?  ?PT Comments  ? ? Pt received supine and agreeable to session with progress towards goals. Pt mod a for bed mobility with pt very particular in instruction on bringing LE to and off bed, pt able to elevate trunk without physical assist. Pt able to come to standing in stedy x4 throughout session with min a from EOB and mod a from low commode. Pt with fair tolerance in standing with ability to stand for up to 30 seconds during longest trial. Pt able to self propel WC in hall with min a to navigate obstacles. Pt demonstrated ability to transfer from personal WC to bed with sliding board and min a to scoot hips laterally. Pt continues to be limited by pain despite premedication. Current plan remains appropriate to address deficits and maximize functional independence and decrease caregiver burden. Pt continues to benefit from skilled PT services to progress toward functional mobility goals.   ?  ?Recommendations for follow up therapy are one component of a multi-disciplinary discharge planning process, led by the attending physician.  Recommendations may be updated based on patient status, additional functional criteria and insurance authorization. ? ?Follow Up Recommendations ? Skilled nursing-short term rehab (<3 hours/day) ?  ?  ?Assistance Recommended at Discharge Frequent or constant Supervision/Assistance  ?Patient can return home with the following Two people to help with walking and/or  transfers;A lot of help with bathing/dressing/bathroom ?  ?Equipment Recommendations ? None recommended by PT  ?  ?Recommendations for Other Services   ? ? ?  ?Precautions / Restrictions Precautions ?Precautions: Fall;Other (comment) ?Precaution Comments: urine incontinence/ TLSO brace in room (pt has been wearing brace since kyphoplasty 04/2021 but states she can't put it on now with her hip pain) ?Restrictions ?Weight Bearing Restrictions: Yes ?LLE Weight Bearing: Weight bearing as tolerated  ?  ? ?Mobility ? Bed Mobility ?Overal bed mobility: Needs Assistance ?Bed Mobility: Supine to Sit, Sit to Supine ?  ?  ?Supine to sit: Mod assist, HOB elevated ?Sit to supine: Mod assist ?  ?General bed mobility comments: pt instructing OT to "grab behind the knees" to bring LEs OOB and back into bed, able to use bedrail to assist and scoot forward ?  ? ?Transfers ?Overall transfer level: Needs assistance ?Equipment used: Sliding board, Ambulation equipment used (stedy) ?Transfers: Bed to chair/wheelchair/BSC, Sit to/from Stand ?Sit to Stand: Mod assist, Min assist ?  ?  ?  ?  ? Lateral/Scoot Transfers: Min assist ?General transfer comment: good carryover with sliding bpard, Min A to scoot/advance hips and steady board from personal wheelchair to bed. Pt able to place some weight through BLE to assist with transfers and board placement, pt able to come to standing x4 with in steady and from stedy seat throughout session with mod assist from low commode and min a from EOB ?  ? ?Ambulation/Gait ?  ?  ?  ?  ?  ?  ?  ?  ? ? ?Stairs ?  ?  ?  ?  ?  ? ? ?  Wheelchair Mobility ?Wheelchair Mobility ?Wheelchair mobility: Yes ?Wheelchair propulsion: Both upper extremities ?Distance: 90 ?Wheelchair Assistance Details (indicate cue type and reason): pt able to self-propel manual w/c from in hall, min a for obstacle negotiation ? ?Modified Rankin (Stroke Patients Only) ?  ? ? ?  ?Balance Overall balance assessment: Needs  assistance ?Sitting-balance support: No upper extremity supported, Feet supported ?Sitting balance-Leahy Scale: Good ?  ?  ?Standing balance support: Bilateral upper extremity supported, During functional activity ?Standing balance-Leahy Scale: Poor ?Standing balance comment: able to stand in stedy for pericare for ~25 seconds, stood x4 throughout session ?  ?  ?  ?  ?  ?  ?  ?  ?  ?  ?  ?  ? ?  ?Cognition Arousal/Alertness: Awake/alert ?Behavior During Therapy: [facility] for tasks assessed/performed ?Overall Cognitive Status: No family/caregiver present to determine baseline cognitive functioning ?Area of Impairment: Memory, Safety/judgement, Awareness, Problem solving ?  ?  ?  ?  ?  ?  ?  ?  ?  ?Current Attention Level: Selective ?Memory: Decreased short-term memory ?Following Commands: Follows one step commands with increased time, Follows multi-step commands inconsistently ?Safety/Judgement: Decreased awareness of safety, Decreased awareness of deficits ?Awareness: Emergent ?Problem Solving: Requires verbal cues ?General Comments: mild memory deficits noted. very particular about sequencing and directing staff throughout session though easily agitated when asked how pt plans to complete tasks at home, with rehab, etc ?  ?  ? ?  ?Exercises   ? ?  ?General Comments   ?  ?  ? ?Pertinent Vitals/Pain Pain Assessment ?Pain Assessment: 0-10 ?Pain Score: 7  ?Pain Location: LLE ?Pain Descriptors / Indicators: Grimacing, Guarding ?Pain Intervention(s): Limited activity within patient's tolerance, Monitored during session, RN gave pain meds during session  ? ? ?Home Living   ?  ?  ?  ?  ?  ?  ?  ?  ?  ?   ?  ?Prior Function    ?  ?  ?   ? ?PT Goals (current goals can now be found in the care plan section) Acute Rehab PT Goals ?Patient Stated Goal: rehab at SNF before return to [facility] ?PT Goal Formulation: With patient ?Time For Goal Achievement: 07/30/21 ? ?  ?Frequency ? ? ? Min 3X/week ? ? ? ?  ?PT Plan Current plan remains  appropriate  ? ? ?Co-evaluation   ?  ?  ?  ?  ? ?  ?AM-PAC PT "6 Clicks" Mobility   ?Outcome Measure ? Help needed turning from your back to your side while in a flat bed without using bedrails?: A Lot ?Help needed moving from lying on your back to sitting on the side of a flat bed without using bedrails?: A Lot ?Help needed moving to and from a bed to a chair (including a wheelchair)?: Total ?Help needed standing up from a chair using your arms (e.g., wheelchair or bedside chair)?: A Lot ?Help needed to walk in hospital room?: Total ?Help needed climbing 3-5 steps with a railing? : Total ?6 Click Score: 9 ? ?  ?End of Session   ?Activity Tolerance: Patient tolerated treatment well ?Patient left: in bed;with call bell/phone within reach ?Nurse Communication: Mobility status ?PT Visit Diagnosis: Other abnormalities of gait and mobility (R26.89);Muscle weakness (generalized) (M62.81);Pain ?Pain - Right/Left: Left ?Pain - part of body: Leg ?  ? ? ?Time: 7353-2992 ?PT Time Calculation (min) (ACUTE ONLY): 48 min ? ?Charges:  $Therapeutic Activity: 38-52 mins          ?          ? ?  Audry Riles. PTA ?Acute Rehabilitation Services ?Office: [phone] ? ? ? ?Betsey Holiday Mcgregor Tinnon ?07/23/2021, 1:19 PM ? ?

## 2021-07-23 NOTE — Plan of Care (Signed)
  Problem: Education: Goal: Knowledge of General Education information will improve Description: Including pain rating scale, medication(s)/side effects and non-pharmacologic comfort measures Outcome: Progressing   Problem: Clinical Measurements: Goal: Ability to maintain clinical measurements within normal limits will improve Outcome: Progressing Goal: Will remain free from infection Outcome: Progressing   

## 2021-07-23 NOTE — TOC Progression Note (Addendum)
Transition of Care (TOC) - Progression Note  ? ? ?Patient Details  ?Name: Christina Fry ?MRN: [id number] ?Date of Birth: July 03, 1940 ? ?Transition of Care (TOC) CM/SW Contact  ?Joanne Chars, LCSW ?Phone Number: ?07/23/2021, 10:46 AM ? ?Clinical Narrative: Efforts to locate [facility] SNF bed for this pt today: ? ?Still no response from [facility] regarding approval for SNF.  Last email on 5/10 from [place] said 1-2 days for approval.    Follow up email sent. ? ?[facility] has declined pt, no [facility] beds. ?[facility]/[place]: left another message, also attempted to reach admissions person by text.  No response so far. ?[facility]/[place]: declined, no beds available ?[facility]: spoke with [place] in admissions, she said she would review referral and call back. ?[facility]/[place]: left another message.  No response. ?[facility]/[place]: left another message. No response. ? ?1145: email from [place].  Pt is not approved for SNF at this time.  Still under review. ? ?Leadership notified of lack of progress.  Chat message to [place].  ? ?Expected Discharge Plan: [place] (SNF) ?Barriers to Discharge: Continued Medical Work up ? ?Expected Discharge Plan and Services ?Expected Discharge Plan: [place] (SNF) ?  ?Discharge Planning Services: CM Consult ?  ?Living arrangements for the past 2 months: Apartment ?                ?  ?  ?  ?  ?  ?  ?  ?  ?  ?  ? ? ?Social Determinants of Health (SDOH) Interventions ?  ? ?Readmission Risk Interventions ? ?  08/28/2020  ?  1:48 PM 12/06/2019  ?  2:10 PM  ?Readmission Risk Prevention Plan  ?Transportation Screening Complete Complete  ?[place] or Home Care Consult Complete   ?Social Work Consult for [place] Planning/Counseling Complete Complete  ?Palliative Care Screening Not Applicable   ?Medication Review [employer]) Complete Complete  ? ? ?

## 2021-07-24 DIAGNOSIS — R262 Difficulty in walking, not elsewhere classified: Secondary | ICD-10-CM | POA: Diagnosis not present

## 2021-07-24 DIAGNOSIS — K921 Melena: Secondary | ICD-10-CM

## 2021-07-24 DIAGNOSIS — Z7901 Long term (current) use of anticoagulants: Secondary | ICD-10-CM

## 2021-07-24 DIAGNOSIS — Z993 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: Secondary | ICD-10-CM | POA: Diagnosis not present

## 2021-07-24 DIAGNOSIS — D62 Acute posthemorrhagic anemia: Secondary | ICD-10-CM

## 2021-07-24 DIAGNOSIS — S72002P Fracture of unspecified part of neck of left femur, subsequent encounter for closed fracture with malunion: Secondary | ICD-10-CM | POA: Diagnosis not present

## 2021-07-24 DIAGNOSIS — I10 Essential (primary) hypertension: Secondary | ICD-10-CM | POA: Diagnosis not present

## 2021-07-24 LAB — HEMOGLOBIN AND HEMATOCRIT, BLOOD
HCT: 27.9 % — ABNORMAL LOW (ref 36.0–46.0)
HCT: 27.6 % — ABNORMAL LOW (ref 36.0–46.0)
Hemoglobin: 8.5 g/dL — ABNORMAL LOW (ref 12.0–15.0)
Hemoglobin: 8.7 g/dL — ABNORMAL LOW (ref 12.0–15.0)

## 2021-07-24 LAB — BASIC METABOLIC PANEL
Anion gap: 7 (ref 5–15)
BUN: 17 mg/dL (ref 8–23)
CO2: 27 mmol/L (ref 22–32)
Calcium: 8.8 mg/dL — ABNORMAL LOW (ref 8.9–10.3)
Chloride: 104 mmol/L (ref 98–111)
Creatinine, Ser: 1.07 mg/dL — ABNORMAL HIGH (ref 0.44–1.00)
GFR, Estimated: 52 mL/min — ABNORMAL LOW (ref >60)
Glucose, Bld: 108 mg/dL — ABNORMAL HIGH (ref 70–99)
Potassium: 4 mmol/L (ref 3.5–5.1)
Sodium: 138 mmol/L (ref 135–145)

## 2021-07-24 LAB — CBC
HCT: 26.7 % — ABNORMAL LOW (ref 36.0–46.0)
Hemoglobin: 8.2 g/dL — ABNORMAL LOW (ref 12.0–15.0)
MCH: 31.2 pg (ref 26.0–34.0)
MCHC: 30.7 g/dL (ref 30.0–36.0)
MCV: 101.5 fL — ABNORMAL HIGH (ref 80.0–100.0)
Platelets: 418 10*3/uL — ABNORMAL HIGH (ref 150–400)
RBC: 2.63 MIL/uL — ABNORMAL LOW (ref 3.87–5.11)
RDW: 16 % — ABNORMAL HIGH (ref 11.5–15.5)
WBC: 10.8 10*3/uL — ABNORMAL HIGH (ref 4.0–10.5)
nRBC: 0 % (ref 0.0–0.2)

## 2021-07-24 LAB — OCCULT BLOOD X 1 CARD TO LAB, STOOL: Fecal Occult Bld: POSITIVE — AB

## 2021-07-24 MED ORDER — PANTOPRAZOLE SODIUM 40 MG PO TBEC
40.0000 mg | DELAYED_RELEASE_TABLET | Freq: Two times a day (BID) | ORAL | Status: DC
Start: 2021-07-24 — End: 2021-07-24
  Filled 2021-07-24: qty 1

## 2021-07-24 MED ORDER — TRAMADOL HCL 50 MG PO TABS
50.0000 mg | ORAL_TABLET | Freq: Four times a day (QID) | ORAL | Status: DC
  Administered 2021-07-24 – 2021-07-27 (×12): 50 mg via ORAL
  Filled 2021-07-24 (×12): qty 1

## 2021-07-24 MED ORDER — PANTOPRAZOLE SODIUM 40 MG IV SOLR
40.0000 mg | Freq: Two times a day (BID) | INTRAVENOUS | Status: DC
  Administered 2021-07-24 – 2021-07-26 (×4): 40 mg via INTRAVENOUS
  Filled 2021-07-24 (×4): qty 10

## 2021-07-24 NOTE — Progress Notes (Signed)
?PROGRESS NOTE ? ? ? ?[name]  ZOX:[id number] DOB: Feb 25, 1941 DOA: 07/14/2021 ?PCP: Ferdie Ping, MD  ? ? ?Chief Complaint  ?Patient presents with  ? Fall  ? ? ?Brief Narrative:  ?HPI per Dr. Phillips Climes on 07/14/21 ? Christina Fry  is a 81 y.o. female, with past medical history of polymyalgia rheumatica, diabetes, asthma, Graves' disease, prior PE on Eliquis, patient presents to ED secondary to fall, patient report fall happened last time, she is mostly wheelchair bound, transfers between chair/bed and wheelchair at her independent living facility, reports last night she was trying to go from wheelchair to chair, she fell back striking her head into the hard floor, as well as her left leg, she was unable to stand up, or to call for help, and laid on the floor from 8 last night until this morning when she was found by staff, and reports severe headache from where she hit her head on the floor, neck pain, left hip pain, left knee pain, she was given fentanyl by EMS, she denies any focal deficits, no fever, no chills,. ?-In ED no significant lab abnormalities, CT head with no evidence of acute findings, findings significant for left hip fracture, patient seen in consultation by Dr. Doreatha Martin, orthopedics and patient underwent surgical repair 07/14/2021.  ? ?Interim History ?Underwent surgical intervention and is doing well postoperatively.  WBC is slightly went up but trended back down and is now stable and she does have an acute blood loss anemia as expected but Hgb/Hct is now improved today.  Need to continue monitor for signs of the bleeding.  PT OT recommending SNF and from a medical standpoint she is stable to D/C to SNF with continued Therapy but [facility] ([facility] but was [facility] to [facility]).   ?  ? ? ?Assessment & Plan: ?  ?Principal Problem: ?  Closed left hip fracture ([place]) ?Active Problems: ?  Mild intermittent asthma without  complication ?  Generalized weakness ?  PMR (polymyalgia rheumatica) (HCC) ?  GERD (gastroesophageal reflux disease) ?  Fibromyalgia ?  Essential hypertension ?  Hypothyroidism ?  Ambulatory dysfunction ?  Pulmonary embolism ([place]) ?  T12 compression fracture ([place]) ?  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 ?  Vitamin B12 deficiency ?  Protein-calorie malnutrition, severe ?  Acute pain of left knee ? ?#1 left hip fracture status post cephalomedullary nailing of the left intertrochanteric femur fracture postop day 7 ?-Secondary to mechanical fall. ?-Patient seen in consultation by orthopedics and underwent surgical repair 07/14/2021 without any significant complications ?-Patient seen by PT/OT who are recommending SNF. ?-Patient still with complaints of uncontrolled left hip pain, complaining of left knee pain today and feels unable to bear weight on left lower extremity. ?-Postop surgery PT/OT recommended WBAT. ?-Patient seen and followed by orthopedics and patient underwent CT of the left knee which was negative for any acute abnormalities but consistent with osteoarthritis.   ?-Eliquis for anticoagulation. ?-Pain management per orthopedics. ?-Will need outpatient follow-up with orthopedics 2 weeks postdischarge for wound check and repeat x-rays. ? ?2.  Left shoulder pain ?-Likely secondary to arthritis. ?-Plain films of the left shoulder negative for acute fracture. ?-Continue Lidoderm patch/Voltaren gel. ? ?3.  Left knee pain ?-Orthopedics informed of patient's left knee pain. ?-CT left knee negative for any acute abnormalities however consistent with tricompartmental osteoarthritis with moderate to severe in the medial compartment.   ?-Patient states  improvement of left knee pain with Voltaren gel. ?-Continue Voltaren gel.   ?-Outpatient follow-up with orthopedics and if no significant improvement at that time may consider steroid injections.   ?-Per orthopedics. ? ?4.  Hyperlipidemia ?-Statin. ? ?5.  Postop acute blood loss  anemia ?-Anemia panel with anemia of chronic disease with low iron levels of 16, TIBC of 249, sed rate shows of 6, ferritin of 35, folate of 10.6.  Vitamin B12 434. ?-Hemoglobin stable at 8.5. ?-Continue oral iron supplementation. ?-Follow H&H. ?-Transfusion threshold hemoglobin < 7. ? ?6.  CKD stage IIIa ?-Stable ?-Avoid nephrotoxins. ? ?7. history of PE ?-Continue Eliquis. ? ?8.  Hypophosphatemia ?-Repleted. ? ?9.  Ambulatory dysfunction/dependence on wheelchair ?-Patient seen by PT/OT recommending SNF placement. ?-TOC working on [employer] approval for placement. ? ?10.  Hyponatremia ?-Improved. ? ?11.  Hypothyroidism ?-Continue Synthroid. ? ?12.  Mild leukocytosis ?-Likely reactive. ?-Patient afebrile, no signs or symptoms of infection. ?-Leukocytosis trending down. ?-Follow. ? ?13.  Hypertension ?-Continue Toprol-XL.  ? ?14.  Hypoalbuminemia ?-Outpatient follow-up. ? ?15.  GERD ?-Increase PPI to twice daily due to concern for melanotic stool.  ? ?16.  Obesity ?-Patient with an estimated BMI of 31.01 kg per metered squared. ?-Lifestyle modification. ?-Outpatient follow-up with PCP. ? ?17.  Melanotic/black tarry stool ?-Patient with complaints of black tarry stool yesterday.  Does endorse a history of peptic ulcer disease.  No chest pain.  No shortness of breath. ?-Due to ongoing use of Eliquis secondary to history of PE, recent hip fracture status post repair currently on Eliquis we will consult with GI for further evaluation and management. ?-Change PPI to twice daily. ? ? ? ?DVT prophylaxis: Eliquis ?Code Status: DNR ?Family Communication: Updated patient.  No family at bedside ?Disposition: SNF when bed available. ? ?Status is: Inpatient ?Remains inpatient appropriate because: Severity of illness, and safe disposition ?  ?Consultants:  ?Orthopedics: Dr. Doreatha Martin 07/14/2021 ? ?Procedures:  ?CT head CT C-spine 07/14/2021 ?CT L-spine 07/14/2021 ?Chest x-ray 07/14/2021 ?Plain films of the left femur 07/14/2021 ?Plain films of the  pelvis and left hip 07/14/2021 ?Plan films of the left knee 07/14/2021 ?Plain films of the left shoulder 07/20/2021 ?Cephalomedullary nailing of left intertrochanteric femur fracture per Dr. Doreatha Martin 07/14/2021. ?CT left knee 07/21/2021 ? ? ? ?Antimicrobials:  ?Acyclovir 07/15/2021>>>> ?IV Ancef 07/14/2021>>>> 07/15/2021 ? ? ?Subjective: ?Patient laying in bed watching television.  No chest pain.  No shortness of breath.  No abdominal pain.  States left hip pain and left knee pain slowly improving daily.  States had black tarry sticky stool yesterday.  No bowel movement yet today.  States had prior history of heartburn and peptic ulcer disease.  Denies any bright red blood per rectum.   ? ?Objective: ?Vitals:  ? 07/23/21 1445 07/23/21 2117 07/24/21 0727 07/24/21 0813  ?BP: (!) 105/54 119/69 (!) 132/48 (!) 123/52  ?Pulse: 88 84 85 83  ?Resp:  '20 18 18  '$ ?Temp: 99.1 ?F (37.3 ?C) 99.2 ?F (37.3 ?C) 98.4 ?F (36.9 ?C) 97.8 ?F (36.6 ?C)  ?TempSrc: Oral Oral Oral   ?SpO2: 93% 91% 100% 98%  ?Weight:      ?Height:      ? ? ?Intake/Output Summary (Last 24 hours) at 07/24/2021 1046 ?Last data filed at 07/23/2021 1516 ?Gross per 24 hour  ?Intake 120 ml  ?Output 300 ml  ?Net -180 ml  ? ? ?Filed Weights  ? 07/14/21 5427 07/14/21 1232  ?Weight: 79.4 kg 79.4 kg  ? ? ?Examination: ? ?General exam:  NAD. ?Respiratory system: CTA B.  No wheezes, no crackles, no rhonchi.  Fair air movement.  Speaking in full sentences.  ?Cardiovascular system: Regular rate rhythm no murmurs rubs or gallops.  No JVD.  No lower extremity edema. ?Gastrointestinal system: Abdomen is soft, nontender, nondistended, positive bowel sounds.  No rebound.  No guarding.  ?Central nervous system: Alert and oriented. No focal neurological deficits. ?Extremities: Left lower extremity with incision site with some bruising noted around it.  Left hip with less tenderness to palpation.  Left knee with less tenderness to palpation.  ?Skin: No rashes, lesions or ulcers ?Psychiatry: Judgement  and insight appear normal. Mood & affect appropriate.  ? ? ? ?Data Reviewed: I have personally reviewed following labs and imaging studies ? ?CBC: ?Recent Labs  ?Lab 07/18/21 ?0214 07/19/21 ?8182 07/21/21 ?0

## 2021-07-24 NOTE — Plan of Care (Signed)
  Problem: Education: Goal: Knowledge of General Education information will improve Description: Including pain rating scale, medication(s)/side effects and non-pharmacologic comfort measures Outcome: Progressing   Problem: Health Behavior/Discharge Planning: Goal: Ability to manage health-related needs will improve Outcome: Progressing   Problem: Clinical Measurements: Goal: Will remain free from infection Outcome: Progressing   

## 2021-07-24 NOTE — Consult Note (Addendum)
?Referring Provider:  [facility] ?Primary Care Physician:  Ferdie Ping, MD ?Primary Gastroenterologist: none ? ?Reason for Consultation:  melena ? ?HPI: Christina Fry is a 81 y.o. female, who was admitted 1 days ago after a fall at which time she sustained a left hip fracture.  She underwent surgical repair on the day of admission.  Patient has history of polymyalgia rheumatica, diabetes mellitus, chronic kidney disease stage III, and prior history of pulmonary embolus for which she is on Eliquis. ?Eliquis had been held perioperatively and was resumed.  She says she had a black tarry stool yesterday, and again this morning, described as a large amount.  No bright red blood.  No nausea or vomiting, she has history of chronic GERD and has been on chronic PPI therapy for many years she believes omeprazole twice daily.  She has some chronic tenderness in her abdomen which she has had for years as well. ?She is status post appendectomy, hysterectomy and believes that she had peptic ulcer disease many years ago and also remembers having an episode of GI bleeding while she was hospitalized and believes this was from "polyps"..  She is cared for at [facility] and says she has had multiple colonoscopies and has had prior upper endoscopies, she does not recall the timing though thinks she has had procedures within the past few years. ? ?She did have some postop worsening of chronic anemia, counts have been stable over the past few days with hemoglobin 8.4 on 07/22/2021> 8.5> 8.2 today ? ?BUN1 7/creatinine 1.07 ? ?She is currently hemodynamically stable, has been started on IV PPI twice daily ?I believe she did receive Eliquis this morning, will confirm ? ? ?Past Medical History:  ?Diagnosis Date  ? Anxiety   ? Cancer [facility])   ? Diabetes mellitus without complication ([place])   ? Fibromyalgia   ? GERD (gastroesophageal reflux disease)   ? Grave's disease   ? Herpes   ? Hyperlipidemia   ? Mild intermittent asthma without  complication 08/09/4130  ? Palpitation   ? Polymyalgia rheumatica ([place])   ? ? ?Past Surgical History:  ?Procedure Laterality Date  ? ABDOMINAL HYSTERECTOMY    ? APPENDECTOMY    ? CARPAL TUNNEL RELEASE    ? FEMUR IM NAIL Left 07/14/2021  ? Procedure: INTRAMEDULLARY (IM) NAIL FEMORAL;  Surgeon: Shona Needles, MD;  Location: [facility];  Service: Orthopedics;  Laterality: Left;  ? HERNIA REPAIR    ? INCISIONAL HERNIA REPAIR  03/20/2012  ? Procedure: LAPAROSCOPIC INCISIONAL HERNIA;  Surgeon: Jamesetta So, MD;  Location: [facility];  Service: General;  Laterality: N/A;  ? INSERTION OF MESH  03/20/2012  ? Procedure: INSERTION OF MESH;  Surgeon: Jamesetta So, MD;  Location: [facility];  Service: General;  Laterality: N/A;  ? IR KYPHO THORACIC WITH BONE BIOPSY  05/10/2021  ? TOTAL ABDOMINAL HYSTERECTOMY W/ BILATERAL SALPINGOOPHORECTOMY    ? ? ?Prior to Admission medications   ?Medication Sig Start Date End Date Taking? Authorizing Provider  ?acetaminophen (TYLENOL) 325 MG tablet Take 975 mg by mouth 2 (two) times daily.   Yes [provider]  ?acyclovir (ZOVIRAX) 200 MG capsule Take 1 capsule (200 mg total) by mouth 2 (two) times daily. 12/10/19  Yes Shelly Coss, MD  ?albuterol (VENTOLIN HFA) 108 (90 Base) MCG/ACT inhaler Inhale 2 puffs into the lungs every 6 (six) hours as needed for wheezing or shortness of breath.   Yes [provider]  ?apixaban Arne Cleveland)  2.5 MG TABS tablet Take 2.5 mg by mouth 2 (two) times daily.   Yes [provider]  ?atorvastatin (LIPITOR) 40 MG tablet Take 20 mg by mouth daily.   Yes [provider]  ?budesonide (RHINOCORT AQUA) 32 MCG/ACT nasal spray one spray per nostril daily (aim for the ear on each side ?Patient taking differently: 1 spray 2 (two) times daily as needed for rhinitis. 06/29/20  Yes Valentina Shaggy, MD  ?calcium carbonate (TUMS - DOSED IN MG ELEMENTAL CALCIUM) 500 MG chewable tablet Chew 500 mg by mouth 2 (two) times daily.   Yes [provider]  ?cholecalciferol (VITAMIN D3) 25 MCG (1000 UNIT) tablet Take 1,000 Units by mouth daily.   Yes [provider]  ?EPINEPHrine 0.3 mg/0.3 mL IJ SOAJ injection Inject 0.3 mg into the muscle as needed for anaphylaxis. ?Patient taking differently: Inject 0.3 mg into the muscle once as needed for anaphylaxis. 04/12/21  Yes Valentina Shaggy, MD  ?gabapentin (NEURONTIN) 100 MG capsule Take 200 mg by mouth 2 (two) times daily.   Yes [provider]  ?hydrOXYzine (ATARAX/VISTARIL) 10 MG tablet Take 1 tablet (10 mg total) by mouth 2 (two) times daily as needed for itching. 11/29/19  Yes Dahal, Marlowe Aschoff, MD  ?levothyroxine (SYNTHROID) 88 MCG tablet Take 88 mcg by mouth daily before breakfast.   Yes [provider]  ?lidocaine (LIDODERM) 5 % Place 1 patch onto the skin daily as needed (back pain). Remove & Discard patch within 12 hours 05/11/21  Yes Elodia Florence., MD  ?melatonin 5 MG TABS Take 5 mg by mouth at bedtime.   Yes [provider]  ?Metoprolol Succinate 50 MG CS24 Take 50 mg by mouth daily.   Yes [provider]  ?NON FORMULARY Inject 1 Dose into the skin once a week. Allergy shots; patient not sure about name   Yes [provider]  ?nortriptyline (PAMELOR) 25 MG capsule Take 50 mg by mouth at bedtime.   Yes [provider]  ?omeprazole (PRILOSEC) 20 MG capsule Take 20 mg by mouth 2 (two) times daily.   Yes [provider]  ?oxycodone (OXY-IR) 5 MG capsule Take 5 mg by mouth every 4 (four) hours as needed for pain.   Yes [provider]  ?Pediatric Multiple Vitamins (FLINSTONES GUMMIES OMEGA-3 DHA) CHEW Chew 1 tablet by mouth daily.   Yes [provider]  ?Propylene Glycol (SYSTANE COMPLETE OP) Place 1 drop into both eyes See admin instructions. Five times a day   Yes [provider]  ?acetaminophen (TYLENOL) 325 MG tablet Take 2 tablets (650 mg total) by mouth every 6 (six) hours. 07/19/21    Raiford Noble Latif, DO  ?diclofenac Sodium (VOLTAREN) 1 % GEL Apply 2 g topically 4 (four) times daily. Apply to left knee and left shoulder 07/22/21   Corinne Ports, PA-C  ?docusate sodium (COLACE) 100 MG capsule Take 1 capsule (100 mg total) by mouth 2 (two) times daily. 07/19/21   Raiford Noble Latif, DO  ?feeding supplement (ENSURE ENLIVE / ENSURE PLUS) LIQD Take 237 mLs by mouth 2 (two) times daily between meals. 07/19/21   Kerney Elbe, DO  ?iron polysaccharides (NIFEREX) 150 MG capsule Take 1 capsule (150 mg total) by mouth daily. 07/19/21   Kerney Elbe, DO  ?levothyroxine (SYNTHROID) 100 MCG tablet Take 1 tablet (100 mcg total) by mouth daily before breakfast. ?Patient not taking: Reported on 07/14/2021 05/12/21 06/11/21  Elodia Florence.,  MD  ?Multiple Vitamin (MULTIVITAMIN WITH MINERALS) TABS tablet Take 1 tablet by mouth daily. 07/19/21   Raiford Noble Latif, DO  ?ondansetron (ZOFRAN) 4 MG tablet Take 1 tablet (4 mg total) by mouth every 6 (six) hours as needed for nausea. 07/19/21   Raiford Noble Latif, DO  ?polyethylene glycol (MIRALAX / GLYCOLAX) 17 g packet Take 17 g by mouth daily. 07/19/21   Raiford Noble Latif, DO  ?polyvinyl alcohol (LIQUIFILM TEARS) 1.4 % ophthalmic solution Place 1 drop into both eyes 3 (three) times daily as needed for dry eyes. 07/19/21   Kerney Elbe, DO  ? ? ?Current Facility-Administered Medications  ?Medication Dose Route Frequency Provider Last Rate Last Admin  ? acetaminophen (TYLENOL) tablet 650 mg  650 mg Oral Q6H Rushie Nyhan A, PA-C   650 mg at 07/24/21 0548  ? acyclovir (ZOVIRAX) 200 MG capsule 200 mg  200 mg Oral BID Raiford Noble Latif, DO   200 mg at 07/24/21 1106  ? albuterol (PROVENTIL) (2.5 MG/3ML) 0.083% nebulizer solution 3 mL  3 mL Inhalation Q6H PRN Raiford Noble Latif, DO      ? apixaban Arne Cleveland) tablet 2.5 mg  2.5 mg Oral BID Joselyn Glassman A, RPH   2.5 mg at 07/24/21 1107  ? atorvastatin (LIPITOR) tablet 20 mg  20 mg Oral Daily Raiford Noble Rankin, DO   20 mg at 07/24/21 1106  ? cholecalciferol (VITAMIN D3) tablet 1,000 Units  1,000 Units Oral Daily Corinne Ports, PA-C   1,000 Units at 07/24/21 1106  ? diclofenac Sodium (VOLTAREN) 1 % topical gel

## 2021-07-25 DIAGNOSIS — Z993 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: Secondary | ICD-10-CM | POA: Diagnosis not present

## 2021-07-25 DIAGNOSIS — R262 Difficulty in walking, not elsewhere classified: Secondary | ICD-10-CM | POA: Diagnosis not present

## 2021-07-25 DIAGNOSIS — S72002P Fracture of unspecified part of neck of left femur, subsequent encounter for closed fracture with malunion: Secondary | ICD-10-CM | POA: Diagnosis not present

## 2021-07-25 DIAGNOSIS — I10 Essential (primary) hypertension: Secondary | ICD-10-CM | POA: Diagnosis not present

## 2021-07-25 LAB — CBC WITH DIFFERENTIAL/PLATELET
Abs Immature Granulocytes: 0.07 10*3/uL (ref 0.00–0.07)
Basophils Absolute: 0 10*3/uL (ref 0.0–0.1)
Basophils Relative: 1 %
Eosinophils Absolute: 0.3 10*3/uL (ref 0.0–0.5)
Eosinophils Relative: 4 %
HCT: 27.5 % — ABNORMAL LOW (ref 36.0–46.0)
Hemoglobin: 8.8 g/dL — ABNORMAL LOW (ref 12.0–15.0)
Immature Granulocytes: 1 %
Lymphocytes Relative: 24 %
Lymphs Abs: 1.9 10*3/uL (ref 0.7–4.0)
MCH: 32 pg (ref 26.0–34.0)
MCHC: 32 g/dL (ref 30.0–36.0)
MCV: 100 fL (ref 80.0–100.0)
Monocytes Absolute: 0.6 10*3/uL (ref 0.1–1.0)
Monocytes Relative: 8 %
Neutro Abs: 5 10*3/uL (ref 1.7–7.7)
Neutrophils Relative %: 62 %
Platelets: 435 10*3/uL — ABNORMAL HIGH (ref 150–400)
RBC: 2.75 MIL/uL — ABNORMAL LOW (ref 3.87–5.11)
RDW: 16.8 % — ABNORMAL HIGH (ref 11.5–15.5)
WBC: 8 10*3/uL (ref 4.0–10.5)
nRBC: 0 % (ref 0.0–0.2)

## 2021-07-25 LAB — BASIC METABOLIC PANEL
Anion gap: 6 (ref 5–15)
BUN: 13 mg/dL (ref 8–23)
CO2: 29 mmol/L (ref 22–32)
Calcium: 9 mg/dL (ref 8.9–10.3)
Chloride: 103 mmol/L (ref 98–111)
Creatinine, Ser: 1.02 mg/dL — ABNORMAL HIGH (ref 0.44–1.00)
GFR, Estimated: 55 mL/min — ABNORMAL LOW (ref >60)
Glucose, Bld: 85 mg/dL (ref 70–99)
Potassium: 4.1 mmol/L (ref 3.5–5.1)
Sodium: 138 mmol/L (ref 135–145)

## 2021-07-25 LAB — HEMOGLOBIN AND HEMATOCRIT, BLOOD
HCT: 26.8 % — ABNORMAL LOW (ref 36.0–46.0)
Hemoglobin: 8.4 g/dL — ABNORMAL LOW (ref 12.0–15.0)

## 2021-07-25 NOTE — H&P (View-Only) (Signed)
Patient ID: Christina Fry, female   DOB: [birth year], 81 y.o.   MRN: [id number] ? ?Brief GI note; ? ?Patient was seen yesterday in consultation for melena. ?Admitted after left hip fracture 07/14/2021 ? ?Patient has been on Eliquis and did receive Eliquis yesterday morning, now on hold ? ?Hemoglobin stable overnight-8.8/hematocrit 27.5 ? ?Patient denies any abdominal pain, says she feels fine, has not had any further melena ? ? ? ?Plan  ?full liquid diet, n.p.o. after midnight ?Plan is for EGD tomorrow-procedure today as she has been stable, and in order to allow further Eliquis washout. ?  ?Continue IV PPI twice daily ?Continue serial hemoglobins ? ? ? ?

## 2021-07-25 NOTE — Progress Notes (Signed)
Patient refused to get her blood drawn for labs. ?

## 2021-07-25 NOTE — Progress Notes (Signed)
?PROGRESS NOTE ? ? ? ?Christina Fry  CLE:[id number] DOB: 1940/12/04 DOA: 07/14/2021 ?PCP: Ferdie Ping, MD  ? ? ?Chief Complaint  ?Patient presents with  ? Fall  ? ? ?Brief Narrative:  ?HPI per Dr. Phillips Climes on 07/14/21 ? Christina Fry  is a 81 y.o. female, with past medical history of polymyalgia rheumatica, diabetes, asthma, Graves' disease, prior PE on Eliquis, patient presents to ED secondary to fall, patient report fall happened last time, she is mostly wheelchair bound, transfers between chair/bed and wheelchair at her independent living facility, reports last night she was trying to go from wheelchair to chair, she fell back striking her head into the hard floor, as well as her left leg, she was unable to stand up, or to call for help, and laid on the floor from 8 last night until this morning when she was found by staff, and reports severe headache from where she hit her head on the floor, neck pain, left hip pain, left knee pain, she was given fentanyl by EMS, she denies any focal deficits, no fever, no chills,. ?-In ED no significant lab abnormalities, CT head with no evidence of acute findings, findings significant for left hip fracture, patient seen in consultation by Dr. Doreatha Martin, orthopedics and patient underwent surgical repair 07/14/2021.  ? ?Interim History ?Underwent surgical intervention and is doing well postoperatively.  WBC is slightly went up but trended back down and is now stable and she does have an acute blood loss anemia as expected but Hgb/Hct is now improved today.  Need to continue monitor for signs of the bleeding.  PT OT recommending SNF and from a medical standpoint she is stable to D/C to SNF with continued Therapy but [facility] ([facility] sent to [facility] but was sent to the [facility]).   ?  ? ? ?Assessment & Plan: ?  ?Principal Problem: ?  Closed left hip fracture ([place]) ?Active Problems: ?  Mild intermittent asthma without  complication ?  Generalized weakness ?  PMR (polymyalgia rheumatica) (H[facility]) ?  GERD (gastroesophageal reflux disease) ?  Fibromyalgia ?  Essential hypertension ?  Hypothyroidism ?  Ambulatory dysfunction ?  Pulmonary embolism ([place]) ?  T12 compression fracture ([place]) ?  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 ?  Vitamin B12 deficiency ?  Protein-calorie malnutrition, severe ?  Acute pain of left knee ? ?#1 left hip fracture status post cephalomedullary nailing of the left intertrochanteric femur fracture postop day 7 ?-Secondary to mechanical fall. ?-Patient seen in consultation by orthopedics and underwent surgical repair 07/14/2021 without any significant complications ?-Patient seen by PT/OT who are recommending SNF. ?-Patient still with complaints of uncontrolled left hip pain, complaining of left knee pain today and feels unable to bear weight on left lower extremity. ?-Postop surgery PT/OT recommended WBAT. ?-Patient seen and followed by orthopedics and patient underwent CT of the left knee which was negative for any acute abnormalities but consistent with osteoarthritis.   ?-Eliquis currently on hold pending GI evaluation for melanotic stools/anemia.   ?-Pain management per orthopedics. ?-Will need outpatient follow-up with orthopedics 2 weeks postdischarge for wound check and repeat x-rays. ? ?2.  Left shoulder pain ?-Likely secondary to arthritis. ?-Plain films of the left shoulder negative for acute fracture. ?-Continue Lidoderm patch/Voltaren gel. ? ?3.  Left knee pain ?-Orthopedics informed of patient's left knee pain. ?-CT left knee negative for any acute abnormalities however consistent with tricompartmental osteoarthritis with moderate to  severe in the medial compartment.   ?-Patient states improvement of left knee pain with Voltaren gel. ?-Continue Voltaren gel.   ?-Outpatient follow-up with orthopedics and if no significant improvement at that time may consider steroid injections.   ?-Per orthopedics. ? ?4.   Hyperlipidemia ?-Continue statin. ? ?5.  Postop acute blood loss anemia ?-Anemia panel with anemia of chronic disease with low iron levels of 16, TIBC of 249, sed rate shows of 6, ferritin of 35, folate of 10.6.  Vitamin B12 434. ?-Hemoglobin stable at 8.6. ?-Continue oral iron supplementation. ?-Follow H&H. ?-Transfusion threshold hemoglobin < 7. ?-Patient being seen by GI and patient for upper endoscopy on 07/26/2021 ? ?6.  CKD stage IIIa ?-Stable ?-Avoid nephrotoxins. ? ?7. history of PE ?-Eliquis on hold for further GI work-up for melanotic stools. ?-GI to advise when anticoagulation may be resumed ? ?8.  Hypophosphatemia ?-Repleted. ? ?9.  Ambulatory dysfunction/dependence on wheelchair ?-Patient seen by PT/OT recommending SNF placement. ?-TOC working on [employer] approval for placement. ? ?10.  Hyponatremia ?-Improved. ? ?11.  Hypothyroidism ?-Synthroid. ? ?12.  Mild leukocytosis ?-Likely reactive. ?-Patient afebrile, no signs or symptoms of infection. ?-Leukocytosis trending down. ?-Follow. ? ?13.  Hypertension ?-Toprol-XL.   ? ?14.  Hypoalbuminemia ?-Outpatient follow-up. ? ?15.  GERD ?-Continue IV PPI twice daily.   ? ?16.  Obesity ?-Patient with an estimated BMI of 31.01 kg per metered squared. ?-Lifestyle modification. ?-Outpatient follow-up with PCP. ? ?17.  Melanotic/black tarry stool ?-Patient with complaints of black tarry stool on 07/24/2021 on 07/25/2021.  Does endorse a history of peptic ulcer disease.  No chest pain.  No shortness of breath. ?-FOBT positive. ?-Hemoglobin stable at 8.8. ?-Due to ongoing use of Eliquis secondary to history of PE, recent hip fracture status post repair currently on Eliquis GI consulted for further evaluation and management.   ?-Patient seen in consultation by GI, Eliquis discontinued 07/24/2021 and patient for probable upper endoscopy on 07/26/2021.   ?-PPI changed to IV PPI twice daily.   ?-Patient currently on a full liquid diet today and will be n.p.o. after midnight.   ?-Per  GI.   ? ? ? ?DVT prophylaxis: Eliquis>>> SCDs ?Code Status: DNR ?Family Communication: Updated patient.  No family at bedside ?Disposition: SNF when bed available. ? ?Status is: Inpatient ?Remains inpatient appropriate because: Severity of illness, and safe disposition ?  ?Consultants:  ?Orthopedics: Dr. Doreatha Martin 07/14/2021 ?Gastroenterology: Dr. Loletha Carrow III 07/24/2021 ? ?Procedures:  ?CT head CT C-spine 07/14/2021 ?CT L-spine 07/14/2021 ?Chest x-ray 07/14/2021 ?Plain films of the left femur 07/14/2021 ?Plain films of the pelvis and left hip 07/14/2021 ?Plan films of the left knee 07/14/2021 ?Plain films of the left shoulder 07/20/2021 ?Cephalomedullary nailing of left intertrochanteric femur fracture per Dr. Doreatha Martin 07/14/2021. ?CT left knee 07/21/2021 ? ? ? ?Antimicrobials:  ?Acyclovir 07/15/2021>>>> ?IV Ancef 07/14/2021>>>> 07/15/2021 ? ? ?Subjective: ?Sleeping but easily arousable.  No chest pain.  No shortness of breath.  No significant abdominal pain.  Left hip and left knee pain slowly improving.  Has not had a bowel movement today.  States she never refused lab work this morning.  ? ?Objective: ?Vitals:  ? 07/24/21 1437 07/24/21 2003 07/25/21 0553 07/25/21 0740  ?BP: (!) 113/47 (!) 126/48 96/66 (!) 116/36  ?Pulse: 91 89 78 78  ?Resp: '18  16 16  '$ ?Temp: 97.7 ?F (36.5 ?C) 98.7 ?F (37.1 ?C) 98 ?F (36.7 ?C) 98.5 ?F (36.9 ?C)  ?TempSrc:  Oral Oral Oral  ?SpO2: 98% 94% 93% 96%  ?  Weight:      ?Height:      ? ? ?Intake/Output Summary (Last 24 hours) at 07/25/2021 1134 ?Last data filed at 07/25/2021 0500 ?Gross per 24 hour  ?Intake --  ?Output 500 ml  ?Net -500 ml  ? ? ?Filed Weights  ? 07/14/21 4585 07/14/21 1232  ?Weight: 79.4 kg 79.4 kg  ? ? ?Examination: ? ?General exam: NAD. ?Respiratory system: Lungs clear to auscultation bilaterally anterior lung fields.  No wheezes, no crackles, no rhonchi.  Fair air movement.  Speaking in full sentences.  ?Cardiovascular system: RRR no murmurs rubs or gallops.  No JVD.  No lower extremity  edema. ?Gastrointestinal system: Abdomen is soft, nontender, nondistended, positive bowel sounds.  No rebound.  No guarding.  ?Central nervous system: Alert and oriented. No focal neurological deficits. ?Extremities: Left lower

## 2021-07-25 NOTE — Plan of Care (Signed)

## 2021-07-25 NOTE — Progress Notes (Addendum)
Patient ID: Christina Fry, female   DOB: Sep 06, 1940, 81 y.o.   MRN: [id number] ? ?Brief GI note; ? ?Patient was seen yesterday in consultation for melena. ?Admitted after left hip fracture 07/14/2021 ? ?Patient has been on Eliquis and did receive Eliquis yesterday morning, now on hold ? ?Hemoglobin stable overnight-8.8/hematocrit 27.5 ? ?Patient denies any abdominal pain, says she feels fine, has not had any further melena ? ? ? ?Plan  ?full liquid diet, n.p.o. after midnight ?Plan is for EGD tomorrow-procedure today as she has been stable, and in order to allow further Eliquis washout. ?  ?Continue IV PPI twice daily ?Continue serial hemoglobins ? ? ? ?

## 2021-07-26 ENCOUNTER — Encounter (HOSPITAL_COMMUNITY)
Admission: EM | Disposition: A | Discharge status: Skilled Nursing Facility | Payer: Self-pay | Source: Home / Self Care | Attending: Internal Medicine

## 2021-07-26 ENCOUNTER — Telehealth: Payer: Self-pay

## 2021-07-26 ENCOUNTER — Inpatient Hospital Stay (HOSPITAL_COMMUNITY): Payer: No Typology Code available for payment source

## 2021-07-26 ENCOUNTER — Inpatient Hospital Stay (HOSPITAL_COMMUNITY): Payer: No Typology Code available for payment source | Admitting: Certified Registered Nurse Anesthetist

## 2021-07-26 ENCOUNTER — Encounter (HOSPITAL_COMMUNITY): Payer: Self-pay | Admitting: Internal Medicine

## 2021-07-26 DIAGNOSIS — D62 Acute posthemorrhagic anemia: Secondary | ICD-10-CM

## 2021-07-26 DIAGNOSIS — Z993 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: Secondary | ICD-10-CM | POA: Diagnosis not present

## 2021-07-26 DIAGNOSIS — K317 Polyp of stomach and duodenum: Secondary | ICD-10-CM

## 2021-07-26 DIAGNOSIS — I10 Essential (primary) hypertension: Secondary | ICD-10-CM | POA: Diagnosis not present

## 2021-07-26 DIAGNOSIS — K2970 Gastritis, unspecified, without bleeding: Secondary | ICD-10-CM

## 2021-07-26 DIAGNOSIS — R262 Difficulty in walking, not elsewhere classified: Secondary | ICD-10-CM | POA: Diagnosis not present

## 2021-07-26 DIAGNOSIS — K2971 Gastritis, unspecified, with bleeding: Secondary | ICD-10-CM

## 2021-07-26 DIAGNOSIS — K449 Diaphragmatic hernia without obstruction or gangrene: Secondary | ICD-10-CM

## 2021-07-26 DIAGNOSIS — S72002P Fracture of unspecified part of neck of left femur, subsequent encounter for closed fracture with malunion: Secondary | ICD-10-CM | POA: Diagnosis not present

## 2021-07-26 DIAGNOSIS — D131 Benign neoplasm of stomach: Secondary | ICD-10-CM

## 2021-07-26 HISTORY — PX: BIOPSY: SHX5522

## 2021-07-26 HISTORY — PX: POLYPECTOMY: SHX5525

## 2021-07-26 HISTORY — PX: ESOPHAGOGASTRODUODENOSCOPY (EGD) WITH PROPOFOL: SHX5813

## 2021-07-26 LAB — BASIC METABOLIC PANEL
Anion gap: 5 (ref 5–15)
BUN: 14 mg/dL (ref 8–23)
CO2: 30 mmol/L (ref 22–32)
Calcium: 8.8 mg/dL — ABNORMAL LOW (ref 8.9–10.3)
Chloride: 100 mmol/L (ref 98–111)
Creatinine, Ser: 1.02 mg/dL — ABNORMAL HIGH (ref 0.44–1.00)
GFR, Estimated: 55 mL/min — ABNORMAL LOW (ref >60)
Glucose, Bld: 89 mg/dL (ref 70–99)
Potassium: 3.9 mmol/L (ref 3.5–5.1)
Sodium: 135 mmol/L (ref 135–145)

## 2021-07-26 LAB — HEMOGLOBIN AND HEMATOCRIT, BLOOD
HCT: 26.1 % — ABNORMAL LOW (ref 36.0–46.0)
HCT: 28.4 % — ABNORMAL LOW (ref 36.0–46.0)
Hemoglobin: 8.2 g/dL — ABNORMAL LOW (ref 12.0–15.0)
Hemoglobin: 8.7 g/dL — ABNORMAL LOW (ref 12.0–15.0)

## 2021-07-26 SURGERY — ESOPHAGOGASTRODUODENOSCOPY (EGD) WITH PROPOFOL
Anesthesia: Monitor Anesthesia Care

## 2021-07-26 MED ORDER — LIDOCAINE 2% (20 MG/ML) 5 ML SYRINGE
INTRAMUSCULAR | Status: DC | PRN
  Administered 2021-07-26: 60 mg via INTRAVENOUS

## 2021-07-26 MED ORDER — LACTATED RINGERS IV SOLN: INTRAVENOUS | Status: DC | PRN

## 2021-07-26 MED ORDER — PROPOFOL 500 MG/50ML IV EMUL
INTRAVENOUS | Status: DC | PRN
  Administered 2021-07-26: 120 ug/kg/min via INTRAVENOUS

## 2021-07-26 MED ORDER — PANTOPRAZOLE SODIUM 40 MG PO TBEC
40.0000 mg | DELAYED_RELEASE_TABLET | Freq: Two times a day (BID) | ORAL | Status: DC
  Administered 2021-07-26 – 2021-07-27 (×2): 40 mg via ORAL
  Filled 2021-07-26 (×2): qty 1

## 2021-07-26 MED ORDER — PROPOFOL 10 MG/ML IV BOLUS
INTRAVENOUS | Status: DC | PRN
  Administered 2021-07-26: 10 mg via INTRAVENOUS
  Administered 2021-07-26: 20 mg via INTRAVENOUS

## 2021-07-26 MED ORDER — APIXABAN 2.5 MG PO TABS
2.5000 mg | ORAL_TABLET | Freq: Two times a day (BID) | ORAL | Status: DC
  Administered 2021-07-26 – 2021-07-27 (×2): 2.5 mg via ORAL
  Filled 2021-07-26 (×2): qty 1

## 2021-07-26 SURGICAL SUPPLY — 15 items

## 2021-07-26 NOTE — Telephone Encounter (Signed)
-----   Message from Lavena Bullion, DO sent at 07/26/2021  2:04 PM EDT ----- ?Patient will likely discharge later today or tomorrow, and needs the following: ? ?- Follow-up with Dr. Loletha Carrow or one of the APP's in 3-4 weeks ?- Repeat CBC in 7-10 days ?- Repeat iron panel in 2 months ? ?Thank you. ? ?

## 2021-07-26 NOTE — Progress Notes (Signed)
PT Cancellation Note ? ?Patient Details ?Name: Christina Fry ?MRN: [id number] ?DOB: September 30, 1940 ? ? ?Cancelled Treatment:    Reason Eval/Treat Not Completed: Patient at procedure or test/unavailable, pt off unit in AM, pt declining mobility s/p anesthesia in PM. Will check back as schedule allows to continue with PT POC. ? ? ? ?[name] ?07/26/2021, 2:53 PM ?

## 2021-07-26 NOTE — Telephone Encounter (Signed)
Patient has been scheduled for a follow up with Dr. Loletha Carrow on Tuesday, 08/24/21 at 2:20 pm. Dr. Bryan Lemma has been notified of appt. Letter mailed to patient with appt information.  ? ?Lab reminders in epic.  ?

## 2021-07-26 NOTE — Interval H&P Note (Signed)
History and Physical Interval Note: ? ?07/26/2021 ?1:11 PM ? ?Christina Fry  has presented today for surgery, with the diagnosis of melena.  The various methods of treatment have been discussed with the patient and family. After consideration of risks, benefits and other options for treatment, the patient has consented to  Procedure(s): ?ESOPHAGOGASTRODUODENOSCOPY (EGD) WITH PROPOFOL (N/A) as a surgical intervention.  The patient's history has been reviewed, patient examined, no change in status, stable for surgery.  I have reviewed the patient's chart and labs.  Questions were answered to the patient's satisfaction.   ? ? ?Christina Fry ? ? ?

## 2021-07-26 NOTE — Anesthesia Preprocedure Evaluation (Signed)
Anesthesia Evaluation  ?Patient identified by MRN, date of birth, ID band ?Patient awake ? ? ? ?Reviewed: ?Allergy & Precautions, NPO status , Patient's Chart, lab work & pertinent test results, reviewed documented beta blocker date and time  ? ?Airway ?Mallampati: III ? ?TM Distance: >3 FB ?Neck ROM: Full ? ? ? Dental ? ?(+) Dental Advisory Given ?  ?Pulmonary ?asthma ,  ?  ?Pulmonary exam normal ?breath sounds clear to auscultation ? ? ? ? ? ? Cardiovascular ?hypertension, Pt. on medications and Pt. on home beta blockers ?+CHF (grade 1 diastolic dysfunction)  ?Normal cardiovascular exam+ Valvular Problems/Murmurs (mild AI) AI  ?Rhythm:Regular Rate:Normal ? ?Echo 08/2020 ?1. Left ventricular ejection fraction, by estimation, is 60 to 65%. The  ?left ventricle has normal function. The left ventricle has no regional  ?wall motion abnormalities. There is moderate left ventricular hypertrophy.  ?Left ventricular diastolic  ?parameters are consistent with Grade I diastolic dysfunction (impaired  ?relaxation).  ??2. Right ventricular systolic function is normal. The right ventricular  ?size is normal.  ??3. Left atrial size was moderately dilated.  ??4. The mitral valve is normal in structure. No evidence of mitral valve  ?regurgitation. No evidence of mitral stenosis.  ??5. The aortic valve is tricuspid. Aortic valve regurgitation is mild. No  ?aortic stenosis is present.  ??6. The inferior vena cava is normal in size with greater than 50%  ?respiratory variability, suggesting right atrial pressure of 3 mmHg.  ?  ?Neuro/Psych ?PSYCHIATRIC DISORDERS Anxiety TIA  ? GI/Hepatic ?Neg liver ROS, GERD  Controlled,  ?Endo/Other  ?diabetesHypothyroidism  ? Renal/GU ?Renal InsufficiencyRenal diseaseCr 1.05  ?negative genitourinary ?  ?Musculoskeletal ? ?(+) Fibromyalgia -takse 5 mg of oxycodone QID  ? Abdominal ?(+) + obese,   ?Peds ?negative pediatric ROS ?(+)  Hematology ?negative hematology  ROS ?(+) Hb 12.9, plt 273   ?Anesthesia Other Findings ? ? Reproductive/Obstetrics ?negative OB ROS ? ?  ? ? ? ? ? ? ? ? ? ? ? ? ? ?  ?  ? ? ? ? ? ? ? ? ?Anesthesia Physical ? ?Anesthesia Plan ? ?ASA: 3 ? ?Anesthesia Plan: MAC  ? ?Post-op Pain Management: Tylenol PO (pre-op)*, Gabapentin PO (pre-op)* and Minimal or no pain anticipated  ? ?Induction: Intravenous ? ?PONV Risk Score and Plan: 2 and Treatment may vary due to age or medical condition and Propofol infusion ? ?Airway Management Planned: Nasal Cannula ? ?Additional Equipment: None ? ?Intra-op Plan:  ? ?Post-operative Plan:  ? ?Informed Consent: I have reviewed the patients History and Physical, chart, labs and discussed the procedure including the risks, benefits and alternatives for the proposed anesthesia with the patient or authorized representative who has indicated his/her understanding and acceptance.  ? ? ? ?Dental advisory given ? ?Plan Discussed with: CRNA ? ?Anesthesia Plan Comments:   ? ? ? ? ? ? ?Anesthesia Quick Evaluation ? ?

## 2021-07-26 NOTE — Anesthesia Postprocedure Evaluation (Signed)
Anesthesia Post Note ? ?Patient: Christina Fry ? ?Procedure(s) Performed: ESOPHAGOGASTRODUODENOSCOPY (EGD) WITH PROPOFOL ?BIOPSY ?POLYPECTOMY ? ?  ? ?Patient location during evaluation: PACU ?Anesthesia Type: MAC ?Level of consciousness: awake and alert ?Pain management: pain level controlled ?Vital Signs Assessment: post-procedure vital signs reviewed and stable ?Respiratory status: spontaneous breathing, nonlabored ventilation and respiratory function stable ?Cardiovascular status: blood pressure returned to baseline and stable ?Postop Assessment: no apparent nausea or vomiting ?Anesthetic complications: no ? ? ?No notable events documented. ? ?Last Vitals:  ?Vitals:  ? 07/26/21 1420 07/26/21 1515  ?BP: (!) 145/49 (!) 114/49  ?Pulse: 76 80  ?Resp: 14 18  ?Temp:  36.9 ?C  ?SpO2: 93% 97%  ?  ?Last Pain:  ?Vitals:  ? 07/26/21 1420  ?TempSrc:   ?PainSc: 9   ? ? ?  ?  ?  ?  ?  ?  ? ?Lynda Rainwater ? ? ? ? ?

## 2021-07-26 NOTE — Plan of Care (Signed)

## 2021-07-26 NOTE — Plan of Care (Signed)

## 2021-07-26 NOTE — Anesthesia Procedure Notes (Signed)
Procedure Name: [place] ?Date/Time: 07/26/2021 1:32 PM ?Performed by: Dorthea Cove, CRNA ?Pre-anesthesia Checklist: Patient identified, Emergency Drugs available, Suction available, Patient being monitored and Timeout performed ?Patient Re-evaluated:Patient Re-evaluated prior to induction ?Oxygen Delivery Method: Nasal cannula ?Preoxygenation: Pre-oxygenation with 100% oxygen ?Induction Type: IV induction ?Placement Confirmation: positive ETCO2 and CO2 detector ?Dental Injury: Teeth and Oropharynx as per pre-operative assessment  ? ? ? ? ?

## 2021-07-26 NOTE — TOC Progression Note (Addendum)
Transition of Care (TOC) - Progression Note  ? ? ?Patient Details  ?Name: Christina Fry ?MRN: [id number] ?Date of Birth: April 21, 1940 ? ?Transition of Care (TOC) CM/SW Contact  ?Joanne Chars, LCSW ?Phone Number: ?07/26/2021, 3:45 PM ? ?Clinical Narrative:  Email from [place], [place].  Pt has been approved for SNF.  ? ?[address]: [facility] [place]: no beds available ? ?Expected Discharge Plan: [facility] (SNF) ?Barriers to Discharge: Continued Medical Work up ? ?Expected Discharge Plan and Services ?Expected Discharge Plan: [place] (SNF) ?  ?Discharge Planning Services: CM Consult ?  ?Living arrangements for the past 2 months: Apartment ?                ?  ?  ?  ?  ?  ?  ?  ?  ?  ?  ? ? ?Social Determinants of Health (SDOH) Interventions ?  ? ?Readmission Risk Interventions ? ?  08/28/2020  ?  1:48 PM 12/06/2019  ?  2:10 PM  ?Readmission Risk Prevention Plan  ?Transportation Screening Complete Complete  ?[place] or Home Care Consult Complete   ?Social Work Consult for [place] Planning/Counseling Complete Complete  ?Palliative Care Screening Not Applicable   ?Medication Review Press photographer) Complete Complete  ? ? ?

## 2021-07-26 NOTE — Progress Notes (Signed)
Ok to resume apixaban per GI and Dr. Grandville Silos.  ? ?Onnie Boer, PharmD, BCIDP, AAHIVP, CPP ?Infectious Disease Pharmacist ?07/26/2021 3:29 PM ? ? ?

## 2021-07-26 NOTE — Progress Notes (Signed)
?PROGRESS NOTE ? ? ? ?Jaquilla Woodroof Smith  YPP:[id number] DOB: Jan 16, 1941 DOA: 07/14/2021 ?PCP: Ferdie Ping, MD  ? ? ?Chief Complaint  ?Patient presents with  ? Fall  ? ? ?Brief Narrative:  ?HPI per Dr. Phillips Climes on 07/14/21 ? Meganne Rita  is a 81 y.o. female, with past medical history of polymyalgia rheumatica, diabetes, asthma, Graves' disease, prior PE on Eliquis, patient presents to ED secondary to fall, patient report fall happened last time, she is mostly wheelchair bound, transfers between chair/bed and wheelchair at her independent living facility, reports last night she was trying to go from wheelchair to chair, she fell back striking her head into the hard floor, as well as her left leg, she was unable to stand up, or to call for help, and laid on the floor from 8 last night until this morning when she was found by staff, and reports severe headache from where she hit her head on the floor, neck pain, left hip pain, left knee pain, she was given fentanyl by EMS, she denies any focal deficits, no fever, no chills,. ?-In ED no significant lab abnormalities, CT head with no evidence of acute findings, findings significant for left hip fracture, patient seen in consultation by Dr. Doreatha Martin, orthopedics and patient underwent surgical repair 07/14/2021.  ? ?Interim History ?Underwent surgical intervention and is doing well postoperatively.  WBC is slightly went up but trended back down and is now stable and she does have an acute blood loss anemia as expected but Hgb/Hct is now improved today.  Need to continue monitor for signs of the bleeding.  PT OT recommending SNF and from a medical standpoint she is stable to D/C to SNF with continued Therapy but [facility] ([facility] have been sent to [facility] but was sent to the [facility]).   ?  ? ? ?Assessment & Plan: ?  ?Principal Problem: ?  Closed left hip fracture ([place]) ?Active Problems: ?  Mild intermittent asthma without  complication ?  Generalized weakness ?  PMR (polymyalgia rheumatica) (HCC) ?  GERD (gastroesophageal reflux disease) ?  Fibromyalgia ?  Essential hypertension ?  Hypothyroidism ?  Ambulatory dysfunction ?  Pulmonary embolism ([place]) ?  T12 compression fracture ([place]) ?  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 ?  Vitamin B12 deficiency ?  Protein-calorie malnutrition, severe ?  Acute pain of left knee ? ?#1 left hip fracture status post cephalomedullary nailing of the left intertrochanteric femur fracture postop day 7 ?-Secondary to mechanical fall. ?-Patient seen in consultation by orthopedics and underwent surgical repair 07/14/2021 without any significant complications ?-Patient seen by PT/OT who are recommending SNF. ?-Patient still with complaints of uncontrolled left hip pain, complaining of left knee pain today and feels unable to bear weight on left lower extremity. ?-Postop surgery PT/OT recommended WBAT. ?-Patient seen and followed by orthopedics and patient underwent CT of the left knee which was negative for any acute abnormalities but consistent with osteoarthritis.   ?-Eliquis currently on hold pending GI evaluation for melanotic stools/anemia.   ?-Pain management per orthopedics. ?-Will need outpatient follow-up with orthopedics 2 weeks postdischarge for wound check and repeat x-rays. ? ?2.  Left shoulder pain ?-Likely secondary to arthritis. ?-Plain films of the left shoulder negative for acute fracture. ?-Continue Lidoderm patch/Voltaren gel. ? ?3.  Left knee pain ?-Orthopedics informed of patient's left knee pain. ?-CT left knee negative for any acute abnormalities however consistent with tricompartmental osteoarthritis with moderate to  severe in the medial compartment.   ?-Patient states improvement of left knee pain with Voltaren gel. ?-Continue Voltaren gel.   ?-Outpatient follow-up with orthopedics and if no significant improvement at that time may consider steroid injections.   ?-Per orthopedics. ? ?4.   Hyperlipidemia ?-Continue statin. ? ?5.  Postop acute blood loss anemia ?-Anemia panel with anemia of chronic disease with low iron levels of 16, TIBC of 249, sed rate shows of 6, ferritin of 35, folate of 10.6.  Vitamin B12 434. ?-Hemoglobin stable at 8.2, this morning. ?-Continue oral iron supplementation. ?-Follow H&H. ?-Transfusion threshold hemoglobin < 7. ?-Patient being seen by GI and patient for upper endoscopy today, 07/26/2021 ? ?6.  CKD stage IIIa ?-Stable ?-Avoid nephrotoxins. ? ?7. history of PE ?-Eliquis on hold for further GI work-up for melanotic stools. ?-GI to advise when anticoagulation may be resumed ? ?8.  Hypophosphatemia ?-Repleted. ? ?9.  Ambulatory dysfunction/dependence on wheelchair ?-Patient seen by PT/OT recommending SNF placement. ?-TOC working on [employer] approval for placement. ? ?10.  Hyponatremia ?-Improved. ? ?11.  Hypothyroidism ?-Synthroid. ? ?12.  Mild leukocytosis ?-Likely reactive. ?-Patient afebrile, no signs or symptoms of infection. ?-Leukocytosis trending down. ?-Follow. ? ?13.  Hypertension ?-Toprol-XL.   ? ?14.  Hypoalbuminemia ?-Outpatient follow-up. ? ?15.  GERD ?-IV PPI twice daily.   ? ?16.  Obesity ?-Patient with an estimated BMI of 31.01 kg per metered squared. ?-Lifestyle modification. ?-Outpatient follow-up with PCP. ? ?17.  Melanotic/black tarry stool ?-Patient with complaints of black tarry stool on 07/24/2021 on 07/25/2021.  Does endorse a history of peptic ulcer disease.  No chest pain.  No shortness of breath. ?-FOBT positive. ?-Hemoglobin stable at 8.2 this morning. ?-Due to ongoing use of Eliquis secondary to history of PE, recent hip fracture status post repair currently on Eliquis GI consulted for further evaluation and management.   ?-Patient seen in consultation by GI, Eliquis discontinued 07/24/2021 and patient for upper endoscopy today 07/26/2021.  ?-PPI changed to IV PPI twice daily.   ?-Patient currently n.p.o. awaiting upper endoscopy.  ?-Per GI. ? ?18.   Suprapubic abdominal pain/lower abdominal pain ?-Patient with suprapubic/bilateral lower abdominal pain to palpation. ?-Check a UA with cultures and sensitivities. ?-Check abdominal films. ? ? ? ?DVT prophylaxis: Eliquis>>> SCDs ?Code Status: DNR ?Family Communication: Updated patient.  No family at bedside ?Disposition: SNF when bed available. ? ?Status is: Inpatient ?Remains inpatient appropriate because: Severity of illness, and safe disposition ?  ?Consultants:  ?Orthopedics: Dr. Doreatha Martin 07/14/2021 ?Gastroenterology: Dr. Loletha Carrow III 07/24/2021 ? ?Procedures:  ?CT head CT C-spine 07/14/2021 ?CT L-spine 07/14/2021 ?Chest x-ray 07/14/2021 ?Plain films of the left femur 07/14/2021 ?Plain films of the pelvis and left hip 07/14/2021 ?Plan films of the left knee 07/14/2021 ?Plain films of the left shoulder 07/20/2021 ?Cephalomedullary nailing of left intertrochanteric femur fracture per Dr. Doreatha Martin 07/14/2021. ?CT left knee 07/21/2021 ? ? ? ?Antimicrobials:  ?Acyclovir 07/15/2021>>>> ?IV Ancef 07/14/2021>>>> 07/15/2021 ? ? ?Subjective: ?Melanotic stool at 3 am today per patient.  No chest pain.  No shortness of breath.  No significant abdominal pain.  Left hip and knee pain improving daily.  Awaiting upper endoscopy today ? ?Objective: ?Vitals:  ? 07/25/21 0740 07/25/21 1251 07/25/21 2109 07/26/21 0627  ?BP: (!) 116/36 113/66 124/62 (!) 100/47  ?Pulse: 78 79 77 74  ?Resp: 16   16  ?Temp: 98.5 ?F (36.9 ?C) 98.9 ?F (37.2 ?C) 97.8 ?F (36.6 ?C) 98.3 ?F (36.8 ?C)  ?TempSrc: Oral Oral Oral Oral  ?SpO2:  96% 95% 95% 93%  ?Weight:      ?Height:      ? ?No intake or output data in the 24 hours ending 07/26/21 0910 ? ?Filed Weights  ? 07/14/21 6606 07/14/21 1232  ?Weight: 79.4 kg 79.4 kg  ? ? ?Examination: ? ?General exam: NAD. ?Respiratory system: CTA B.  No wheezes, no crackles, no rhonchi.  Normal respiratory effort.  Speaking in full sentences.  ?Cardiovascular system: Regular rate rhythm no murmurs rubs or gallops.  No JVD.  No lower extremity  edema. ?Gastrointestinal system: Abdomen is soft, nondistended, positive bowel sounds.  Some suprapubic tenderness to palpation.  No rebound.  No guarding. ?Central nervous system: Alert and oriented. No focal neurological

## 2021-07-26 NOTE — Transfer of Care (Signed)
Immediate Anesthesia Transfer of Care Note ? ?Patient: Christina Fry ? ?Procedure(s) Performed: ESOPHAGOGASTRODUODENOSCOPY (EGD) WITH PROPOFOL ?BIOPSY ?POLYPECTOMY ? ?Patient Location: Endoscopy Unit ? ?Anesthesia Type:MAC ? ?Level of Consciousness: awake and drowsy ? ?Airway & Oxygen Therapy: Patient Spontanous Breathing ? ?Post-op Assessment: Report given to RN and Post -op Vital signs reviewed and stable ? ?Post vital signs: Reviewed and stable ? ?Last Vitals:  ?Vitals Value Taken Time  ?BP 109/41 07/26/21 1356  ?Temp    ?Pulse 77 07/26/21 1357  ?Resp 11 07/26/21 1357  ?SpO2 92 % 07/26/21 1357  ?Vitals shown include unvalidated device data. ? ?Last Pain:  ?Vitals:  ? 07/26/21 1258  ?TempSrc: Temporal  ?PainSc: 9   ?   ? ?Patients Stated Pain Goal: 2 (07/25/21 0740) ? ?Complications: No notable events documented. ?

## 2021-07-26 NOTE — Op Note (Signed)
[facility] ?Patient Name: Christina Fry ?Procedure Date : 07/26/2021 ?MRN: [id number] ?Attending MD: Gerrit Heck , MD ?Date of Birth: 05-10-1940 ?CSN: [id number] ?Age: 81 ?Admit Type: Outpatient ?Procedure:                Upper GI endoscopy ?Indications:              Acute post hemorrhagic anemia, Melena ?Providers:                Gerrit Heck, MD, Dulcy Fanny, Charlean Merl  ?                          Purcell Nails, Technician ?Referring MD:              ?Medicines:                Monitored Anesthesia Care ?Complications:            No immediate complications. ?Estimated Blood Loss:     Estimated blood loss was minimal. ?Procedure:                Pre-Anesthesia Assessment: ?                          - Prior to the procedure, a History and Physical  ?                          was performed, and patient medications and  ?                          allergies were reviewed. The patient's tolerance of  ?                          previous anesthesia was also reviewed. The risks  ?                          and benefits of the procedure and the sedation  ?                          options and risks were discussed with the patient.  ?                          All questions were answered, and informed consent  ?                          was obtained. Prior Anticoagulants: The patient has  ?                          taken Eliquis (apixaban), last dose was 12 days  ?                          prior to procedure. ASA Grade Assessment: III - A  ?                          patient with severe systemic disease. After  ?  reviewing the risks and benefits, the patient was  ?                          deemed in satisfactory condition to undergo the  ?                          procedure. ?                          After obtaining informed consent, the endoscope was  ?                          passed under direct vision. Throughout the  ?                          procedure, the patient's blood pressure,  pulse, and  ?                          oxygen saturations were monitored continuously. The  ?                          [id number]-[id number] ([id number]) Olympus endoscope was introduced  ?                          through the mouth, and advanced to the second part  ?                          of duodenum. The upper GI endoscopy was  ?                          accomplished without difficulty. The patient  ?                          tolerated the procedure well. ?Scope In: ?Scope Out: ?Findings: ?     A 3 cm hiatal hernia was present. ?     The examined esophagus was normal. ?     Multiple 2 to 5 mm sessile polyps with no bleeding and no stigmata of  ?     recent bleeding were found in the gastric fundus and in the gastric  ?     body. Several of these polyps were removed with a cold biopsy forceps  ?     for histologic representative evaluation. Resection and retrieval were  ?     complete. Estimated blood loss was minimal. ?     Patchy mild inflammation characterized by congestion (edema) and  ?     erythema was found in the gastric fundus, in the gastric body and in the  ?     gastric antrum. There was a single 2 mm, superficial, non-bleeding  ?     erosion in the antrum. Biopsies were taken with a cold forceps for  ?     histology. Estimated blood loss was minimal. ?     The examined duodenum was normal. ?Impression:               - 3 cm hiatal hernia. ?                          -  Normal esophagus. ?                          - Multiple gastric polyps. Resected and retrieved. ?                          - Mild gastritis with a single, superficial,  ?                          non-bleeding erosion in the antrum. Biopsied. ?                          - Normal examined duodenum. ?Recommendation:           - Return patient to hospital ward for ongoing care. ?                          - Advance diet as tolerated. ?                          - Continue present medications. ?                          - Await pathology results. ?                           - Resume Eliquis (apixaban) at prior dose today. ?                          - Repeat CBC 7-10 days after hospital discharge to  ?                          ensure improving back to baseline. ?                          - Repeat iron panel in 2 months. If continued  ?                          anemia, particularly if iron deficiency, will plan  ?                          on on further evaluation as outpatient to include  ?                          possible colonoscopy and/or Video Capsule Endoscopy. ?                          - Will arrange for follow-up in the GI clinic. ?                          - Please do not hesitate to contact the inpatient  ?                          GI service with additional questions or concerns. ?Procedure Code(s):        --- Professional --- ?  97673, Esophagogastroduodenoscopy, flexible,  ?                          transoral; with biopsy, single or multiple ?Diagnosis Code(s):        --- Professional --- ?                          K44.9, Diaphragmatic hernia without obstruction or  ?                          gangrene ?                          K31.7, Polyp of stomach and duodenum ?                          K29.70, Gastritis, unspecified, without bleeding ?                          D62, Acute posthemorrhagic anemia ?                          K92.1, Melena (includes Hematochezia) ?CPT copyright 2019 American Medical Association. All rights reserved. ?The codes documented in this report are preliminary and upon coder review may  ?be revised to meet current compliance requirements. ?Gerrit Heck, MD ?07/26/2021 2:01:14 PM ?Number of Addenda: 0 ?

## 2021-07-27 ENCOUNTER — Encounter (HOSPITAL_COMMUNITY): Payer: Self-pay | Admitting: Gastroenterology

## 2021-07-27 DIAGNOSIS — K2970 Gastritis, unspecified, without bleeding: Secondary | ICD-10-CM

## 2021-07-27 DIAGNOSIS — M25562 Pain in left knee: Secondary | ICD-10-CM | POA: Diagnosis not present

## 2021-07-27 DIAGNOSIS — I10 Essential (primary) hypertension: Secondary | ICD-10-CM | POA: Diagnosis not present

## 2021-07-27 DIAGNOSIS — K2990 Gastroduodenitis, unspecified, without bleeding: Secondary | ICD-10-CM

## 2021-07-27 DIAGNOSIS — R262 Difficulty in walking, not elsewhere classified: Secondary | ICD-10-CM | POA: Diagnosis not present

## 2021-07-27 DIAGNOSIS — S72002P Fracture of unspecified part of neck of left femur, subsequent encounter for closed fracture with malunion: Secondary | ICD-10-CM | POA: Diagnosis not present

## 2021-07-27 LAB — BASIC METABOLIC PANEL
Anion gap: 5 (ref 5–15)
BUN: 11 mg/dL (ref 8–23)
CO2: 29 mmol/L (ref 22–32)
Calcium: 8.7 mg/dL — ABNORMAL LOW (ref 8.9–10.3)
Chloride: 101 mmol/L (ref 98–111)
Creatinine, Ser: 0.99 mg/dL (ref 0.44–1.00)
GFR, Estimated: 57 mL/min — ABNORMAL LOW (ref >60)
Glucose, Bld: 95 mg/dL (ref 70–99)
Potassium: 3.8 mmol/L (ref 3.5–5.1)
Sodium: 135 mmol/L (ref 135–145)

## 2021-07-27 LAB — CBC WITH DIFFERENTIAL/PLATELET
Abs Immature Granulocytes: 0.07 10*3/uL (ref 0.00–0.07)
Basophils Absolute: 0 10*3/uL (ref 0.0–0.1)
Basophils Relative: 0 %
Eosinophils Absolute: 0.3 10*3/uL (ref 0.0–0.5)
Eosinophils Relative: 3 %
HCT: 27 % — ABNORMAL LOW (ref 36.0–46.0)
Hemoglobin: 8.5 g/dL — ABNORMAL LOW (ref 12.0–15.0)
Immature Granulocytes: 1 %
Lymphocytes Relative: 17 %
Lymphs Abs: 1.9 10*3/uL (ref 0.7–4.0)
MCH: 31.6 pg (ref 26.0–34.0)
MCHC: 31.5 g/dL (ref 30.0–36.0)
MCV: 100.4 fL — ABNORMAL HIGH (ref 80.0–100.0)
Monocytes Absolute: 0.9 10*3/uL (ref 0.1–1.0)
Monocytes Relative: 8 %
Neutro Abs: 8 10*3/uL — ABNORMAL HIGH (ref 1.7–7.7)
Neutrophils Relative %: 71 %
Platelets: 425 10*3/uL — ABNORMAL HIGH (ref 150–400)
RBC: 2.69 MIL/uL — ABNORMAL LOW (ref 3.87–5.11)
RDW: 16.9 % — ABNORMAL HIGH (ref 11.5–15.5)
WBC: 11.2 10*3/uL — ABNORMAL HIGH (ref 4.0–10.5)
nRBC: 0 % (ref 0.0–0.2)

## 2021-07-27 LAB — URINALYSIS, ROUTINE W REFLEX MICROSCOPIC
Bilirubin Urine: NEGATIVE
Glucose, UA: NEGATIVE mg/dL
Hgb urine dipstick: NEGATIVE
Ketones, ur: NEGATIVE mg/dL
Leukocytes,Ua: NEGATIVE
Nitrite: NEGATIVE
Protein, ur: NEGATIVE mg/dL
Specific Gravity, Urine: 1.012 (ref 1.005–1.030)
pH: 6 (ref 5.0–8.0)

## 2021-07-27 LAB — SURGICAL PATHOLOGY

## 2021-07-27 MED ORDER — PANTOPRAZOLE SODIUM 40 MG PO TBEC
40.0000 mg | DELAYED_RELEASE_TABLET | Freq: Two times a day (BID) | ORAL | 1 refills | Status: AC
Start: 2021-07-27 — End: ?

## 2021-07-27 MED ORDER — OXYCODONE HCL 5 MG PO CAPS: 5.0000 mg | ORAL_CAPSULE | ORAL | 0 refills | Status: AC | PRN

## 2021-07-27 MED ORDER — TRAMADOL HCL 50 MG PO TABS: 50.0000 mg | ORAL_TABLET | Freq: Four times a day (QID) | ORAL | 0 refills | Status: AC

## 2021-07-27 MED ORDER — LIDOCAINE 5 % EX PTCH: 1.0000 | MEDICATED_PATCH | Freq: Every day | CUTANEOUS | 0 refills | Status: AC | PRN

## 2021-07-27 NOTE — Discharge Summary (Signed)
Physician Discharge Summary  ?Christina Fry BSJ:[id number] DOB: 11-Jun-1940 DOA: 07/14/2021 ? ?PCP: Ferdie Ping, MD ? ?Admit date: 07/14/2021 ?Discharge date: 07/27/2021 ? ?Time spent: 60 minutes ? ?Recommendations for Outpatient Follow-up:  ?Follow-up with MD at skilled nursing facility.  Patient will need a CBC done in 1 week to follow-up on H&H.  Patient also need a basic metabolic profile done in 1 week to follow-up on electrolytes and renal function,  patient will need a anemia/iron panel done in 2 months. ?Follow-up with Dr. Doreatha Martin, orthopedic in 2 weeks. ?Follow-up with Dr. Loletha Carrow III, gastroenterology in 3 weeks.  Office will call with appointment time. ? ? ?Discharge Diagnoses:  ?Principal Problem: ?  Closed left hip fracture ([place]) ?Active Problems: ?  Mild intermittent asthma without complication ?  Generalized weakness ?  PMR (polymyalgia rheumatica) (HCC) ?  GERD (gastroesophageal reflux disease) ?  Fibromyalgia ?  Essential hypertension ?  Hypothyroidism ?  Ambulatory dysfunction ?  Pulmonary embolism ([place]) ?  T12 compression fracture ([place]) ?  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 ?  Vitamin B12 deficiency ?  Protein-calorie malnutrition, severe ?  Acute pain of left knee ?  Gastritis and gastroduodenitis ?  Fundic gland polyps of stomach, benign ?  Hiatal hernia ? ? ?Discharge Condition: Stable and improved ? ?Diet recommendation: Dysphagia 3 diet. ? ?Filed Weights  ? 07/14/21 7654 07/14/21 1232 07/26/21 1258  ?Weight: 79.4 kg 79.4 kg 79.4 kg  ? ? ?History of present illness:  ?HPI per Dr. Waldron Labs ?Christina Fry  is a 81 y.o. female, with past medical history of polymyalgia rheumatica, diabetes, asthma, Graves' disease, prior PE on Eliquis, patient presents to ED secondary to fall, patient report fall happened last time, she is mostly wheelchair bound, transfers between chair/bed and wheelchair at her independent living facility, reports last night she was trying to go from wheelchair to chair, she fell back striking her  head into the hard floor, as well as her left leg, she was unable to stand up, or to call for help, and laid on the floor from 8 last night until this morning when she was found by staff, and reports severe headache from where she hit her head on the floor, neck pain, left hip pain, left knee pain, she was given fentanyl by EMS, she denies any focal deficits, no fever, no chills,. ?-In ED no significant lab abnormalities, CT head with no evidence of acute findings, findings significant for left hip fracture, ED discussed with orthopedic Dr. Doreatha Martin, who reports plan for surgery today, last dose of Eliquis was the morning prior to admission.  ?  ?Hospital Course:  ?#1 left hip fracture status post cephalomedullary nailing of the left intertrochanteric femur fracture postop day 7 ?-Secondary to mechanical fall. ?-Patient seen in consultation by orthopedics and underwent surgical repair 07/14/2021 without any significant complications ?-Patient seen by PT/OT who recommended SNF. ?-Patient still with complaints of uncontrolled left hip pain, complaining of left knee pain and feels unable to bear weight on left lower extremity early on in the hospitalization. ?-Postop surgery PT/OT recommended WBAT. ?-Patient seen and followed by orthopedics and patient underwent CT of the left knee which was negative for any acute abnormalities but consistent with osteoarthritis.   ?-Eliquis was held initially during the hospitalization pending GI evaluation for melanotic stools.   ?-Patient underwent upper endoscopy.   ?-Patient maintained on  PPI.  C ?-After endoscopy GI recommended resumption of Eliquis which patient was placed back on.  ?-Pain management per  orthopedics. ?-Outpatient follow-up with orthopedics 2 weeks postdischarge for wound check and repeat x-rays. ? ?2.  Left shoulder pain ?-Likely secondary to arthritis. ?-Plain films of the left shoulder negative for acute fracture. ?-Patient was placed on Lidoderm patch/Voltaren  gel. ? ?3.  Left knee pain ?-Orthopedics informed of patient's left knee pain. ?-CT left knee negative for any acute abnormalities however consistent with tricompartmental osteoarthritis with moderate to severe in the medial compartment.   ?-Patient states improvement of left knee pain with Voltaren gel.   ?-Outpatient follow-up with orthopedics and if no significant improvement at that time may consider steroid injections.   ?-Per orthopedics. ? ?4.  Hyperlipidemia ?-Patient maintained on statin. ? ?5.  Postop acute blood loss anemia ?-Anemia panel with anemia of chronic disease with low iron levels of 16, TIBC of 249, sed rate shows of 6, ferritin of 35, folate of 10.6.  Vitamin B12 434. ?-Hemoglobin stabilized at 8.5 by day of discharge. ?-Patient maintained on oral iron supplementations.   ?-Patient was seen by GI due to concerns for melanotic stools and underwent upper endoscopy 07/26/2021 which showed multiple gastric polyps, mild gastritis with solitary superficial nonbleeding erosion in the antrum.  Pathology is obtained.   ?-Hemoglobin remained stable.   ?-Outpatient follow-up with GI.  ? ?6.  CKD stage IIIa ?-Stable ?-Avoid nephrotoxins. ? ?7. history of PE ?-Eliquis was held due to concerns for melanotic stools.   ?-Patient sitting consultation by GI, underwent upper endoscopy which showed multiple gastric polyps, nonbleeding erosion in the antrum superficial, gastritis.   ?-Patient maintained on PPI.   ?-Eliquis resumed per GI recommendations.   ?-Outpatient follow-up.   ? ?8.  Hypophosphatemia ?-Repleted. ? ?9.  Ambulatory dysfunction/dependence on wheelchair ?-Patient seen by PT/OT recommended SNF placement. ?-Patient was discharged to SNF. ? ?10.  Hyponatremia ?-Improved and had resolved by day of discharge ? ?11.  Hypothyroidism ?-Patient maintained on home regimen Synthroid. ? ?12.  Mild leukocytosis ?-Likely reactive. ?-Patient afebrile, no signs or symptoms of infection. ?-Leukocytosis trended down  during the hospitalization ?-Outpatient follow-up. ? ?13.  Hypertension ?-Patient maintained on home regimen Toprol-XL.   ? ?14.  Hypoalbuminemia ?-Outpatient follow-up. ? ?15.  GERD ?-Patient maintained on PPI during the hospitalization.   ? ?16.  Obesity ?-Patient with an estimated BMI of 31.01 kg per metered squared. ?-Lifestyle modification. ?-Outpatient follow-up with PCP. ? ?17.  Melanotic/black tarry stool secondary to multiple gastric polyps status postresection, mild gastritis with solitary superficial nonbleeding erosion in the antrum ?-Patient with complaints of black tarry stool on 07/24/2021 on 07/25/2021.  DID endorse a history of peptic ulcer disease.  No chest pain.  No shortness of breath. ?-FOBT positive. ?-Hemoglobin remained stable and was 8.5 by day of discharge.   ?-Due to ongoing use of Eliquis secondary to history of PE, recent hip fracture status post repair currently on Eliquis GI consulted for further evaluation and management.   ?-Patient seen in consultation by GI, Eliquis was held 07/24/2021 and patient for underwent upper endoscopy on 07/26/2021 which showed multiple gastric polyps which were resected with pathology pending, mild gastritis with solitary, superficial, nonbleeding erosion in the antrum with pathology pending.  -Patient was placed on PPI IV twice daily.   ?-Patient subsequently transition to PPI p.o. twice daily per GI recommendations and Eliquis resumed.   ?-Patient will need outpatient follow-up with GI in 3 weeks and will need repeat CBC done in 7 to 10 days as well as a repeat iron panel done in  2 months.   ?-Hemoglobin remained stable and patient was discharged in stable and improved condition to skilled nursing facility.  ? ?18.  Suprapubic abdominal pain/lower abdominal pain ?-Patient with suprapubic/bilateral lower abdominal pain to palpation. ?-Urinalysis done unremarkable.   ?-Abdominal films negative.   ?-Improved clinically.   ?-Will be discharged in stable and  improved condition.  ? ?  ? ?Procedures: ?CT head CT C-spine 07/14/2021 ?CT L-spine 07/14/2021 ?Chest x-ray 07/14/2021 ?Plain films of the left femur 07/14/2021 ?Plain films of the pelvis and left hip 07/14/2021

## 2021-07-27 NOTE — Plan of Care (Signed)
Patient educated on discharge orders. Patient has no further questions.  ? ?Problem: Education: ?Goal: Knowledge of General Education information will improve ?Description: Including pain rating scale, medication(s)/side effects and non-pharmacologic comfort measures ?07/27/2021 1457 by Lars Masson, LPN ?Outcome: Adequate for Discharge ?07/27/2021 1101 by Lars Masson, LPN ?Outcome: Progressing ?  ?Problem: Health Behavior/Discharge Planning: ?Goal: Ability to manage health-related needs will improve ?Outcome: Adequate for Discharge ?  ?Problem: Clinical Measurements: ?Goal: Ability to maintain clinical measurements within normal limits will improve ?Outcome: Adequate for Discharge ?Goal: Will remain free from infection ?Outcome: Adequate for Discharge ?Goal: Diagnostic test results will improve ?Outcome: Adequate for Discharge ?Goal: Respiratory complications will improve ?Outcome: Adequate for Discharge ?Goal: Cardiovascular complication will be avoided ?Outcome: Adequate for Discharge ?  ?Problem: Activity: ?Goal: Risk for activity intolerance will decrease ?07/27/2021 1457 by Lars Masson, LPN ?Outcome: Adequate for Discharge ?07/27/2021 1101 by Lars Masson, LPN ?Outcome: Progressing ?  ?Problem: Nutrition: ?Goal: Adequate nutrition will be maintained ?Outcome: Adequate for Discharge ?  ?Problem: Coping: ?Goal: Level of anxiety will decrease ?Outcome: Adequate for Discharge ?  ?Problem: Elimination: ?Goal: Will not experience complications related to bowel motility ?Outcome: Adequate for Discharge ?Goal: Will not experience complications related to urinary retention ?Outcome: Adequate for Discharge ?  ?Problem: Pain Managment: ?Goal: General experience of comfort will improve ?07/27/2021 1457 by Lars Masson, LPN ?Outcome: Adequate for Discharge ?07/27/2021 1101 by Lars Masson, LPN ?Outcome: Progressing ?  ?Problem: Safety: ?Goal: Ability to remain free from injury will improve ?07/27/2021 1457 by  Lars Masson, LPN ?Outcome: Adequate for Discharge ?07/27/2021 1101 by Lars Masson, LPN ?Outcome: Progressing ?  ?Problem: Skin Integrity: ?Goal: Risk for impaired skin integrity will decrease ?07/27/2021 1457 by Lars Masson, LPN ?Outcome: Adequate for Discharge ?07/27/2021 1101 by Lars Masson, LPN ?Outcome: Progressing ?  ?

## 2021-07-27 NOTE — TOC Transition Note (Signed)
Transition of Care (TOC) - CM/SW Discharge Note ? ? ?Patient Details  ?Name: Christina Fry ?MRN: [id number] ?Date of Birth: March 27, 1940 ? ?Transition of Care [facility]) CM/SW Contact:  ?Joanne Chars, LCSW ?Phone Number: ?07/27/2021, 1:05 PM ? ? ?Clinical Narrative:   Pt discharging to [facility], room 301.  RN call [phone] for report. ? ? ? ?Final next level of care: [place] ?Barriers to Discharge: Barriers Resolved ? ? ?Patient Goals and CMS Choice ?  ?  ?  ? ?Discharge Placement ?  ?           ?Patient [facility] bed at:  [facility]) ?Patient to be transferred to facility by: [facility] ?Name of family member notified: [name] ?Patient and family notified of of transfer: 07/27/21 ? ?Discharge Plan and Services ?  ?Discharge Planning Services: CM Consult ?           ?  ?  ?  ?  ?  ?  ?  ?  ?  ?  ? ?Social Determinants of Health (SDOH) Interventions ?  ? ? ?Readmission Risk Interventions ? ?  08/28/2020  ?  1:48 PM 12/06/2019  ?  2:10 PM  ?Readmission Risk Prevention Plan  ?Transportation Screening Complete Complete  ?[place] or Home Care Consult Complete   ?Social Work Consult for [place] Planning/Counseling Complete Complete  ?Palliative Care Screening Not Applicable   ?Medication Review Press photographer) Complete Complete  ? ? ? ? ? ?

## 2021-07-27 NOTE — Progress Notes (Signed)
Physical Therapy Treatment ?Patient Details ?Name: Christina Fry ?MRN: [id number] ?DOB: 06-Mar-1941 ?Today's Date: 07/27/2021 ? ? ?History of Present Illness 81 y.o. female admitted from [place] on 07/14/21 after fall when trying to go from w/c to chair and striking her head on floor, found down by staff the next morning. Head/cervical CT negative for acute abnormality. Workup for L intertrochanteric femur fx s/p cephalomedullary nailing 5/3. PMH includes DM, fibromyalgia, kyphoplasty (04/2021), HLD, anxiety, cancer. ? ?  ?PT Comments  ? ? Pt received supine and agreeable to session with continued progress towards goals. Pt demonstrating ability to transfer from EOB<>WC with lateral scoot transfer with sliding board with min assist to shift hips. Pt able to come to standing throughout session x3 with min assist and stand pivot WC<>commode with mod assist to guide hips. Pt overall demonstrating increased activity tolerance and increased ability to accept weight and increased tolerance for mobility with LLE. Pt continues to be limited by pain and general fatigue. Current plan remains appropriate to address deficits and maximize functional independence and decrease caregiver burden. Pt continues to benefit from skilled PT services to progress toward functional mobility goals.   ?  ?Recommendations for follow up therapy are one component of a multi-disciplinary discharge planning process, led by the attending physician.  Recommendations may be updated based on patient status, additional functional criteria and insurance authorization. ? ?Follow Up Recommendations ? Skilled nursing-short term rehab (<3 hours/day) ?  ?  ?Assistance Recommended at Discharge Frequent or constant Supervision/Assistance  ?Patient can return home with the following Two people to help with walking and/or transfers;A lot of help with bathing/dressing/bathroom ?  ?Equipment Recommendations ? None recommended by PT  ?  ?Recommendations for Other  Services   ? ? ?  ?Precautions / Restrictions Precautions ?Precautions: Fall;Other (comment) ?Precaution Comments: urine incontinence/ TLSO brace in room (pt has been wearing brace since kyphoplasty 04/2021 but states she can't put it on now with her hip pain) ?Restrictions ?Weight Bearing Restrictions: Yes ?LLE Weight Bearing: Weight bearing as tolerated  ?  ? ?Mobility ? Bed Mobility ?Overal bed mobility: Needs Assistance ?Bed Mobility: Supine to Sit, Sit to Supine ?  ?  ?Supine to sit: Mod assist, HOB elevated ?Sit to supine: Min assist ?  ?General bed mobility comments: able to initiate LEs towards EOB, use of gait belt as strap to assist LLE back into bed ?  ? ?Transfers ?Overall transfer level: Needs assistance ?Equipment used: Sliding board, 1 person hand held assist ?Transfers: Bed to chair/wheelchair/BSC, Sit to/from Stand ?Sit to Stand: Min assist ?Stand pivot transfers: Mod assist ?  ?  ?  ? Lateral/Scoot Transfers: Min assist ?General transfer comment: good carryover with sliding bpard, Min A to scoot/advance hips and steady board from bed<>personal WC. Pt able to place some weight through BLE to assist with transfers and board placement, pt able to come to standing x3 throughout session with use of hands on rails on BR, mod assist stand pivot transfer from WC<>commode to guide hips and advance LLE ?  ? ?Ambulation/Gait ?  ?  ?  ?  ?  ?  ?  ?  ? ? ?Stairs ?  ?  ?  ?  ?  ? ? ?Wheelchair Mobility ?Wheelchair Mobility ?Wheelchair Assistance Details (indicate cue type and reason): pt able to self-propel manual w/c in room to<>from BR, light min assist to get up and over curb ? ?Modified Rankin (Stroke Patients Only) ?  ? ? ?  ?  Balance Overall balance assessment: Needs assistance ?Sitting-balance support: No upper extremity supported, Feet supported ?Sitting balance-Leahy Scale: Good ?  ?  ?Standing balance support: Bilateral upper extremity supported, During functional activity ?Standing balance-Leahy Scale:  Poor ?Standing balance comment: able to come to standing in BR with BUE on rails ?  ?  ?  ?  ?  ?  ?  ?  ?  ?  ?  ?  ? ?  ?Cognition Arousal/Alertness: Awake/alert ?Behavior During Therapy: [facility] for tasks assessed/performed ?Overall Cognitive Status: No family/caregiver present to determine baseline cognitive functioning ?Area of Impairment: Memory, Safety/judgement, Awareness, Problem solving ?  ?  ?  ?  ?  ?  ?  ?  ?  ?Current Attention Level: Selective ?Memory: Decreased short-term memory ?Following Commands: Follows one step commands with increased time, Follows multi-step commands inconsistently ?Safety/Judgement: Decreased awareness of safety, Decreased awareness of deficits ?Awareness: Emergent ?Problem Solving: Requires verbal cues ?General Comments: mild memory deficits noted. very particular about sequencing and directing staff throughout session though easily agitated when asked how pt plans to complete tasks at home, with rehab, etc ?  ?  ? ?  ?Exercises   ? ?  ?General Comments   ?  ?  ? ?Pertinent Vitals/Pain Pain Assessment ?Pain Assessment: Faces ?Faces Pain Scale: Hurts little more ?Breathing: normal ?Negative Vocalization: none ?Pain Location: LLE ?Pain Descriptors / Indicators: Grimacing, Guarding ?Pain Intervention(s): Limited activity within patient's tolerance, Monitored during session  ? ? ?Home Living   ?  ?  ?  ?  ?  ?  ?  ?  ?  ?   ?  ?Prior Function    ?  ?  ?   ? ?PT Goals (current goals can now be found in the care plan section) Acute Rehab PT Goals ?Patient Stated Goal: rehab at SNF before return to [place] ?PT Goal Formulation: With patient ?Time For Goal Achievement: 07/30/21 ? ?  ?Frequency ? ? ? Min 3X/week ? ? ? ?  ?PT Plan Current plan remains appropriate  ? ? ?Co-evaluation   ?  ?  ?  ?  ? ?  ?AM-PAC PT "6 Clicks" Mobility   ?Outcome Measure ? Help needed turning from your back to your side while in a flat bed without using bedrails?: A Lot ?Help needed moving from lying on your back  to sitting on the side of a flat bed without using bedrails?: A Lot ?Help needed moving to and from a bed to a chair (including a wheelchair)?: Total ?Help needed standing up from a chair using your arms (e.g., wheelchair or bedside chair)?: A Lot ?Help needed to walk in hospital room?: Total ?Help needed climbing 3-5 steps with a railing? : Total ?6 Click Score: 9 ? ?  ?End of Session Equipment Utilized During Treatment: Gait belt ?Activity Tolerance: Patient tolerated treatment well ?Patient left: in bed;with call bell/phone within reach ?Nurse Communication: Mobility status ?PT Visit Diagnosis: Other abnormalities of gait and mobility (R26.89);Muscle weakness (generalized) (M62.81);Pain ?Pain - Right/Left: Left ?Pain - part of body: Leg ?  ? ? ?Time: 9371-6967 ?PT Time Calculation (min) (ACUTE ONLY): 25 min ? ?Charges:  $Therapeutic Activity: 23-37 mins          ?          ? ?Audry Riles. PTA ?Acute Rehabilitation Services ?Office: [phone] ? ? ? ?Betsey Holiday Nathania Waldman ?07/27/2021, 1:46 PM ? ?

## 2021-07-27 NOTE — TOC Progression Note (Signed)
Transition of Care (TOC) - Progression Note  ? ? ?Patient Details  ?Name: Christina Fry ?MRN: [id number] ?Date of Birth: 11-20-1940 ? ?Transition of Care (TOC) CM/SW Contact  ?Joanne Chars, LCSW ?Phone Number: ?07/27/2021, 10:20 AM ? ?Clinical Narrative:   CSW spoke with De[facility].  She can make bed offer.  CSW spoke with pt and [name], who was on the phone, and they accept this offer.  ? ? ? ?Expected Discharge Plan: [place] (SNF) ?Barriers to Discharge: Continued Medical Work up ? ?Expected Discharge Plan and Services ?Expected Discharge Plan: [place] (SNF) ?  ?Discharge Planning Services: CM Consult ?  ?Living arrangements for the past 2 months: Apartment ?                ?  ?  ?  ?  ?  ?  ?  ?  ?  ?  ? ? ?Social Determinants of Health (SDOH) Interventions ?  ? ?Readmission Risk Interventions ? ?  08/28/2020  ?  1:48 PM 12/06/2019  ?  2:10 PM  ?Readmission Risk Prevention Plan  ?Transportation Screening Complete Complete  ?[place] or Home Care Consult Complete   ?Social Work Consult for [place] Planning/Counseling Complete Complete  ?Palliative Care Screening Not Applicable   ?Medication Review Press photographer) Complete Complete  ? ? ?

## 2021-07-27 NOTE — Progress Notes (Addendum)
? ? Attending physician's note  ? ?I have taken a history, reviewed the chart, and examined the patient. I performed a substantive portion of this encounter, including complete performance of at least one of the key components, in conjunction with the APP. I agree with the APP's note, impression, and recommendations with my edits.  ? ?Pathology results from upper endoscopy: ?FINAL MICROSCOPIC DIAGNOSIS:  ? ?A.   STOMACH, POLYPECTOMY:  ?-    Fundic gland polyps.  ? ?B.   STOMACH,BIOPSY:  ?-    Reactive gastropathy, antral.  ?-    Gastric oxyntic mucosa with no specific pathologic diagnosis.  ?-    Negative for an inflammatory pattern predictive of Helicobacter  ?pylori infection.  ?-    Negative for intestinal metaplasia and malignancy.  ? ? ?- Okay to discharge to SNF as planned ?- Plan for repeat CBC 7-10 days after hospital discharge.  This can be done at SNF ?- We will arrange for GI follow-up as outlined ?- Inpatient GI service will sign off at this time ? ?[address] DO, FACG ?([phone] office  ? ?   ? ? ? ? ?       Daily Rounding Note ? ?07/27/2021, 9:21 AM ? LOS: 13 days  ? ?SUBJECTIVE:   ?Chief complaint:    melena, FOBT +, anemia. ? ?L leg hurts.  No GI complaints.   ? ?OBJECTIVE:        ? Vital signs in last 24 hours:    ?Temp:  [97.1 ?F (36.2 ?C)-98.5 ?F (36.9 ?C)] 98 ?F (36.7 ?C) (05/16 3267) ?Pulse Rate:  [76-86] 86 (05/16 0855) ?Resp:  [13-18] 18 (05/16 0855) ?BP: (89-147)/(41-99) 115/94 (05/16 0855) ?SpO2:  [93 %-98 %] 98 % (05/16 0855) ?Weight:  [79.4 kg] 79.4 kg (05/15 1258) ?Last BM Date : 07/25/21 ?Filed Weights  ? 07/14/21 1245 07/14/21 1232 07/26/21 1258  ?Weight: 79.4 kg 79.4 kg 79.4 kg  ? ?General: NAD.  Resting comfortably. ?Heart: RRR ?Chest: no labored breathing or cough ?Abdomen: soft, ND, NT  ?Extremities: large bruising at L hip in region of incision ?Neuro/Psych:  pleasant.  Alert.  No confusion.   ? ?Intake/Output from  previous day: ?05/15 0701 - 05/16 0700 ?In: 100 [I.V.:100] ?Out: -  ? ?Intake/Output this shift: ?No intake/output data recorded. ? ?Lab Results: ?Recent Labs  ?  07/25/21 ?1128 07/25/21 ?2118 07/26/21 ?0358 07/26/21 ?1206 07/27/21 ?0256  ?WBC 8.0  --   --   --  11.2*  ?HGB 8.8*   < > 8.2* 8.7* 8.5*  ?HCT 27.5*   < > 26.1* 28.4* 27.0*  ?PLT 435*  --   --   --  425*  ? < > = values in this interval not displayed.  ? ?BMET ?Recent Labs  ?  07/25/21 ?1128 07/26/21 ?0358 07/27/21 ?0256  ?NA 138 135 135  ?K 4.1 3.9 3.8  ?CL 103 100 101  ?CO2 '29 30 29  '$ ?GLUCOSE 85 89 95  ?BUN '13 14 11  '$ ?CREATININE 1.02* 1.02* 0.99  ?CALCIUM 9.0 8.8* 8.7*  ? ?LFT ?No results for input(s): PROT, ALBUMIN, AST, ALT, ALKPHOS, BILITOT, BILIDIR, IBILI in the last 72 hours. ?PT/INR ?No results for input(s): LABPROT, INR in the last 72 hours. ?Hepatitis Panel ?No results for input(s): HEPBSAG, HCVAB, HEPAIGM, HEPBIGM in the last 72 hours. ? ?Studies/Results: ?DG Abd 2 Views ? ?Result Date: 07/26/2021 ?CLINICAL DATA:  [place] stool. EXAM: ABDOMEN - 2 VIEW COMPARISON:  CT the abdomen pelvis 04/28/2021  FINDINGS: Bowel gas pattern is unremarkable.  Mesh hernia repair noted. Left hip ORIF noted. T12 vertebral augmentation noted. Vascular calcifications are present. IMPRESSION: No acute abnormality. Electronically Signed   By: San Morelle M.D.   On: 07/26/2021 12:29   ? ?Scheduled Meds: ? acetaminophen  650 mg Oral Q6H  ? acyclovir  200 mg Oral BID  ? apixaban  2.5 mg Oral BID  ? atorvastatin  20 mg Oral Daily  ? cholecalciferol  1,000 Units Oral Daily  ? diclofenac Sodium  2 g Topical QID  ? feeding supplement  237 mL Oral BID BM  ? gabapentin  200 mg Oral BID  ? iron polysaccharides  150 mg Oral Daily  ? levothyroxine  100 mcg Oral QAC breakfast  ? lidocaine  1 patch Transdermal Q24H  ? metoprolol succinate  50 mg Oral Daily  ? multivitamin with minerals  1 tablet Oral Daily  ? nortriptyline  50 mg Oral QHS  ? pantoprazole  40 mg Oral BID  ?  polyethylene glycol  17 g Oral Daily  ? traMADol  50 mg Oral Q6H  ? ?Continuous Infusions: ? methocarbamol (ROBAXIN) IV    ? ?PRN Meds:.albuterol, HYDROmorphone (DILAUDID) injection, hydrOXYzine, methocarbamol **OR** methocarbamol (ROBAXIN) IV, metoCLOPramide **OR** metoCLOPramide (REGLAN) injection, ondansetron **OR** ondansetron (ZOFRAN) IV, oxyCODONE, polyvinyl alcohol ? ?ASSESMENT:  ? ?Postop (hip fx repair) worsening of chronic anemia.  Melena.  Low iron, TIBC, iron sats.  Low normal ferritin.   07/26/21 EGD: HH.  Multiple gastric polyps resected, path pending.  Mild gastritis with solitary, superficial, nonbleeding erosion in antrum, path pending.  Normal examined duodenum.   Protonix 40 mg po bid in place. Omeprazole 20 mg bid PTA.  Hgb 8.2 .. 8.5.  No transfusions or iron infusions to date.   ? ?Chronic Eliquis.  For history of PE.  Med resumed last night.   ? ?Fall related left hip fracture, s/p ORIF repair.  Nonambulatory at baseline for couple of years PTA. ? ? ?PLAN  ? ?Repeat CBC in about 7 to 10 days after hospital discharge to assure this is improving.  Repeat iron panel 2 months from discharge to assure iron repletion.  For ongoing anemia, heme positive stool or persistent iron deficit, consider video capsule endoscopy.  GI will arrange follow-up appointment in the clinic with either Dr. Loletha Carrow.   ? ?Await results of pathology from gastric polyps ? ?  Continue PPI bid at discharge.   ? ? ? ?Azucena Freed  07/27/2021, 9:21 AM ?Phone [phone]  ?

## 2021-07-27 NOTE — Plan of Care (Signed)

## 2021-07-29 LAB — URINE CULTURE

## 2021-08-03 ENCOUNTER — Telehealth: Payer: Self-pay

## 2021-08-03 NOTE — Telephone Encounter (Signed)
-----   Message from Yevette Edwards, RN sent at 07/26/2021  2:05 PM EDT ----- Regarding: Labs CBC - need to enter order

## 2021-08-03 NOTE — Telephone Encounter (Signed)
Called and spoke with [name]. She states that pt will not be able to have repeat labs at our office. She states that pt is at a rehab facility in [place] and under the care of a doctor there. She states that she will relay the need for repeat CBC. She states that she needed to cancel the patient's appt as well because she will follow up care with the [place]. [name] had no other concerns at the end of the call.

## 2021-08-03 NOTE — Telephone Encounter (Signed)
Understood, thank you.  - HD 

## 2021-08-13 NOTE — Telephone Encounter (Signed)
Received updated authorization & updated it in system.

## 2021-08-24 ENCOUNTER — Ambulatory Visit: Payer: No Typology Code available for payment source | Admitting: Gastroenterology

## 2022-02-15 ENCOUNTER — Encounter (HOSPITAL_COMMUNITY): Payer: Self-pay | Admitting: Radiology

## 2023-05-19 ENCOUNTER — Encounter (HOSPITAL_COMMUNITY): Payer: Self-pay

## 2023-05-19 ENCOUNTER — Emergency Department (HOSPITAL_COMMUNITY): Payer: Self-pay

## 2023-05-19 ENCOUNTER — Emergency Department (HOSPITAL_COMMUNITY)
Admission: EM | Admit: 2023-05-19 | Discharge: 2023-05-20 | Disposition: A | Discharge status: Home / Self Care | Payer: Self-pay | Attending: Emergency Medicine | Admitting: Emergency Medicine

## 2023-05-19 ENCOUNTER — Other Ambulatory Visit: Payer: Self-pay

## 2023-05-19 DIAGNOSIS — R112 Nausea with vomiting, unspecified: Secondary | ICD-10-CM | POA: Insufficient documentation

## 2023-05-19 DIAGNOSIS — R197 Diarrhea, unspecified: Secondary | ICD-10-CM | POA: Diagnosis not present

## 2023-05-19 DIAGNOSIS — J45909 Unspecified asthma, uncomplicated: Secondary | ICD-10-CM | POA: Diagnosis not present

## 2023-05-19 DIAGNOSIS — E119 Type 2 diabetes mellitus without complications: Secondary | ICD-10-CM | POA: Diagnosis not present

## 2023-05-19 LAB — CBC WITH DIFFERENTIAL/PLATELET
Abs Immature Granulocytes: 0.05 10*3/uL (ref 0.00–0.07)
Basophils Absolute: 0.1 10*3/uL (ref 0.0–0.1)
Basophils Relative: 0 %
Eosinophils Absolute: 0 10*3/uL (ref 0.0–0.5)
Eosinophils Relative: 0 %
HCT: 45.2 % (ref 36.0–46.0)
Hemoglobin: 14.2 g/dL (ref 12.0–15.0)
Immature Granulocytes: 0 %
Lymphocytes Relative: 9 %
Lymphs Abs: 1.5 10*3/uL (ref 0.7–4.0)
MCH: 30.5 pg (ref 26.0–34.0)
MCHC: 31.4 g/dL (ref 30.0–36.0)
MCV: 97.2 fL (ref 80.0–100.0)
Monocytes Absolute: 0.7 10*3/uL (ref 0.1–1.0)
Monocytes Relative: 4 %
Neutro Abs: 13.6 10*3/uL — ABNORMAL HIGH (ref 1.7–7.7)
Neutrophils Relative %: 87 %
Platelets: 340 10*3/uL (ref 150–400)
RBC: 4.65 MIL/uL (ref 3.87–5.11)
RDW: 15.8 % — ABNORMAL HIGH (ref 11.5–15.5)
WBC: 15.9 10*3/uL — ABNORMAL HIGH (ref 4.0–10.5)
nRBC: 0 % (ref 0.0–0.2)

## 2023-05-19 LAB — RESP PANEL BY RT-PCR (RSV, FLU A&B, COVID)  RVPGX2
Influenza A by PCR: NEGATIVE
Influenza B by PCR: NEGATIVE
Resp Syncytial Virus by PCR: NEGATIVE
SARS Coronavirus 2 by RT PCR: NEGATIVE

## 2023-05-19 MED ORDER — SODIUM CHLORIDE 0.9 % IV BOLUS
1000.0000 mL | Freq: Once | INTRAVENOUS | Status: AC
  Administered 2023-05-19: 1000 mL via INTRAVENOUS

## 2023-05-19 MED ORDER — FAMOTIDINE IN NACL 20-0.9 MG/50ML-% IV SOLN
20.0000 mg | Freq: Once | INTRAVENOUS | Status: AC
  Administered 2023-05-19: 20 mg via INTRAVENOUS
  Filled 2023-05-19: qty 50

## 2023-05-19 MED ORDER — ONDANSETRON HCL 4 MG/2ML IJ SOLN
4.0000 mg | Freq: Once | INTRAMUSCULAR | Status: AC
  Administered 2023-05-19: 4 mg via INTRAVENOUS
  Filled 2023-05-19: qty 2

## 2023-05-19 MED ORDER — HYDROMORPHONE HCL 1 MG/ML IJ SOLN
0.5000 mg | Freq: Once | INTRAMUSCULAR | Status: AC
  Administered 2023-05-19: 0.5 mg via INTRAVENOUS
  Filled 2023-05-19: qty 0.5

## 2023-05-19 NOTE — ED Triage Notes (Signed)
 Pt from [place], BIB daughter after complaints of vomiting and diarrhea. Pt's daughter stated that pt has progressively gotten worse since she spoke with her this morning. Pt weak and not talking in triage. Pt's daughter stated that she has been vomiting dark brown emesis.

## 2023-05-19 NOTE — ED Provider Notes (Signed)
 [facility]-EMERGENCY DEPT [facility] Emergency Department Provider Note MRN:  [id number]  Arrival date & time: 05/20/23     Chief Complaint   Vomiting and diarrhea History of Present Illness   [name] is a 83 y.o. year-old female with a history of diabetes presenting to the ED with chief complaint of vomiting and diarrhea.  Persistent nausea vomiting and diarrhea all day today.  Associated with diffuse moderate to severe abdominal pain worse with palpation.  Denies fever.  Emesis becoming dark brown in nature.  Patient normally very stoic according to family, today she is crying, and so family is concerned something bad is going on.  Review of Systems  A thorough review of systems was obtained and all systems are negative except as noted in the HPI and PMH.   Patient's Health History    Past Medical History:  Diagnosis Date   Anxiety    Cancer (HCC)    Diabetes mellitus without complication (HCC)    Fibromyalgia    GERD (gastroesophageal reflux disease)    Grave's disease    Herpes    Hyperlipidemia    Mild intermittent asthma without complication 04/04/2018   Palpitation    Polymyalgia rheumatica (HCC)     Past Surgical History:  Procedure Laterality Date   ABDOMINAL HYSTERECTOMY     APPENDECTOMY     BIOPSY  07/26/2021   Procedure: BIOPSY;  Surgeon: Shellia Cleverly, DO;  Location: [facility];  Service: Gastroenterology;;   CARPAL TUNNEL RELEASE     ESOPHAGOGASTRODUODENOSCOPY (EGD) WITH PROPOFOL N/A 07/26/2021   Procedure: ESOPHAGOGASTRODUODENOSCOPY (EGD) WITH PROPOFOL;  Surgeon: Shellia Cleverly, DO;  Location: [facility] ENDOSCOPY;  Service: Gastroenterology;  Laterality: N/A;   FEMUR IM NAIL Left 07/14/2021   Procedure: INTRAMEDULLARY (IM) NAIL FEMORAL;  Surgeon: Roby Lofts, MD;  Location: [facility] OR;  Service: Orthopedics;  Laterality: Left;   HERNIA REPAIR     INCISIONAL HERNIA REPAIR  03/20/2012   Procedure: LAPAROSCOPIC INCISIONAL HERNIA;  Surgeon: Dalia Heading,  MD;  Location: [facility] ORS;  Service: General;  Laterality: N/A;   INSERTION OF MESH  03/20/2012   Procedure: INSERTION OF MESH;  Surgeon: Dalia Heading, MD;  Location: [facility] ORS;  Service: General;  Laterality: N/A;   IR KYPHO THORACIC WITH BONE BIOPSY  05/10/2021   POLYPECTOMY  07/26/2021   Procedure: POLYPECTOMY;  Surgeon: Shellia Cleverly, DO;  Location: [facility];  Service: Gastroenterology;;   TOTAL ABDOMINAL HYSTERECTOMY W/ BILATERAL SALPINGOOPHORECTOMY      Family History  Problem Relation Age of Onset   Allergic rhinitis Neg Hx    Asthma Neg Hx    Eczema Neg Hx    Urticaria Neg Hx    Angioedema Neg Hx    Atopy Neg Hx    Immunodeficiency Neg Hx     Social History   Socioeconomic History   Marital status: Single    Spouse name: Not on file   Number of children: Not on file   Years of education: Not on file   Highest education level: Not on file  Occupational History   Not on file  Tobacco Use   Smoking status: Never   Smokeless tobacco: Never  Vaping Use   Vaping status: Never Used  Substance and Sexual Activity   Alcohol use: No   Drug use: No   Sexual activity: Not on file  Other Topics Concern   Not on file  Social History Narrative   Not on file  Social Drivers of Corporate investment banker Strain: Not on file  Food Insecurity: Not on file  Transportation Needs: Not on file  Physical Activity: Not on file  Stress: Not on file  Social Connections: Not on file  Intimate Partner Violence: Not on file     Physical Exam   Vitals:   05/19/23 2240 05/20/23 0000  BP: 133/71 (!) 149/78  Pulse: 99 89  Resp: 16   Temp: 98.5 F (36.9 C)   SpO2: 93% 92%    CONSTITUTIONAL: Chronically ill-appearing, NAD NEURO/PSYCH:  Alert and oriented x 3, no focal deficits EYES:  eyes equal and reactive ENT/NECK:  no LAD, no JVD CARDIO: Regular rate, well-perfused, normal S1 and S2 PULM:  CTAB no wheezing or rhonchi GI/GU: Mild distention with moderate diffuse  tenderness MSK/SPINE:  No gross deformities, no edema SKIN:  no rash, atraumatic   *Additional and/or pertinent findings included in MDM below  Diagnostic and Interventional Summary    EKG Interpretation Date/Time:    Ventricular Rate:    PR Interval:    QRS Duration:    QT Interval:    QTC Calculation:   R Axis:      Text Interpretation:         Labs Reviewed  CBC WITH DIFFERENTIAL/PLATELET - Abnormal; Notable for the following components:      Result Value   WBC 15.9 (*)    RDW 15.8 (*)    Neutro Abs 13.6 (*)    All other components within normal limits  LACTIC ACID, PLASMA - Abnormal; Notable for the following components:   Lactic Acid, Venous 2.0 (*)    All other components within normal limits  COMPREHENSIVE METABOLIC PANEL - Abnormal; Notable for the following components:   Glucose, Bld 157 (*)    Creatinine, Ser 1.22 (*)    Alkaline Phosphatase 200 (*)    GFR, Estimated 43 (*)    All other components within normal limits  RESP PANEL BY RT-PCR (RSV, FLU A&B, COVID)  [id number]  LACTIC ACID, PLASMA  LIPASE, BLOOD    CT ABDOMEN PELVIS WO CONTRAST  Final Result      Medications  ondansetron (ZOFRAN) injection 4 mg (4 mg Intravenous Given 05/19/23 2353)  famotidine (PEPCID) IVPB 20 mg premix (0 mg Intravenous Stopped 05/20/23 0040)  sodium chloride 0.9 % bolus 1,000 mL (1,000 mLs Intravenous New Bag/Given 05/19/23 2352)  HYDROmorphone (DILAUDID) injection 0.5 mg (0.5 mg Intravenous Given 05/19/23 2354)     Procedures  /  Critical Care Procedures  ED Course and Medical Decision Making  Initial Impression and Ddx Symptoms could be explained by gastroenteritis however patient is pretty significantly tender on exam diffusely raising concern for perforated viscus, SBO.  Will need CT imaging.  Vitals are reassuring, good mental status.  Past medical/surgical history that increases complexity of ED encounter: Diabetes  Interpretation of Diagnostics I personally reviewed  the Laboratory Testing and my interpretation is as follows: No significant blood count or electrolyte disturbance.  Mild leukocytosis, mild lactic acid elevation  CT without acute findings.  Patient Reassessment and Ultimate Disposition/Management     Patient has made a dramatic recovery, feels so much better, no further nausea or vomiting, symptoms are resolved.  With this improvement, normal vitals, reassuring workup she is appropriate for discharge with symptomatic management at home.  Patient management required discussion with the following services or consulting groups:  None  Complexity of Problems Addressed Acute illness or injury that poses threat of  life of bodily function  Additional Data Reviewed and Analyzed Further history obtained from: Further history from spouse/family member  Additional Factors Impacting ED Encounter Risk Consideration of hospitalization  Elmer Sow. Pilar Plate, MD [facility] Emergency Medicine [facility] [email or web address]  Final Clinical Impressions(s) / ED Diagnoses     ICD-10-CM   1. Nausea vomiting and diarrhea  R11.2    R19.7       ED Discharge Orders          Ordered    loperamide (IMODIUM) 2 MG capsule  4 times daily PRN        05/20/23 0148    dicyclomine (BENTYL) 20 MG tablet  2 times daily        05/20/23 0148    ondansetron (ZOFRAN-ODT) 4 MG disintegrating tablet  Every 8 hours PRN        05/20/23 0148             Discharge Instructions Discussed with and Provided to Patient:     Discharge Instructions      You were evaluated in the Emergency Department and after careful evaluation, we did not find any emergent condition requiring admission or further testing in the hospital.  Your exam/testing today is overall reassuring.  Symptoms may be due to a stomach bug or viral illness.  Can use the Zofran as needed for nausea, use the Bentyl as needed for crampy abdominal pain, use the Imodium as needed for  diarrhea.  Plenty of fluids and rest.  Please return to the Emergency Department if you experience any worsening of your condition.   Thank you for allowing [place] to be a part of your care.       Sabas Sous, MD 05/20/23 [phone]

## 2023-05-20 LAB — COMPREHENSIVE METABOLIC PANEL
ALT: 20 U/L (ref 0–44)
AST: 28 U/L (ref 15–41)
Albumin: 4.4 g/dL (ref 3.5–5.0)
Alkaline Phosphatase: 200 U/L — ABNORMAL HIGH (ref 38–126)
Anion gap: 12 (ref 5–15)
BUN: 21 mg/dL (ref 8–23)
CO2: 25 mmol/L (ref 22–32)
Calcium: 9.5 mg/dL (ref 8.9–10.3)
Chloride: 103 mmol/L (ref 98–111)
Creatinine, Ser: 1.22 mg/dL — ABNORMAL HIGH (ref 0.44–1.00)
GFR, Estimated: 43 mL/min — ABNORMAL LOW (ref >60)
Glucose, Bld: 157 mg/dL — ABNORMAL HIGH (ref 70–99)
Potassium: 4.3 mmol/L (ref 3.5–5.1)
Sodium: 140 mmol/L (ref 135–145)
Total Bilirubin: 0.9 mg/dL (ref 0.0–1.2)
Total Protein: 7.6 g/dL (ref 6.5–8.1)

## 2023-05-20 LAB — LIPASE, BLOOD: Lipase: 32 U/L (ref 11–51)

## 2023-05-20 LAB — LACTIC ACID, PLASMA
Lactic Acid, Venous: 1.4 mmol/L (ref 0.5–1.9)
Lactic Acid, Venous: 2 mmol/L (ref 0.5–1.9)

## 2023-05-20 MED ORDER — LOPERAMIDE HCL 2 MG PO CAPS: 2.0000 mg | ORAL_CAPSULE | Freq: Four times a day (QID) | ORAL | 0 refills | Status: AC | PRN

## 2023-05-20 MED ORDER — ONDANSETRON 4 MG PO TBDP: 4.0000 mg | ORAL_TABLET | Freq: Three times a day (TID) | ORAL | 0 refills | Status: AC | PRN

## 2023-05-20 MED ORDER — DICYCLOMINE HCL 20 MG PO TABS: 20.0000 mg | ORAL_TABLET | Freq: Two times a day (BID) | ORAL | 0 refills | Status: AC

## 2023-05-20 NOTE — Discharge Instructions (Signed)
 You were evaluated in the Emergency Department and after careful evaluation, we did not find any emergent condition requiring admission or further testing in the hospital.  Your exam/testing today is overall reassuring.  Symptoms may be due to a stomach bug or viral illness.  Can use the Zofran as needed for nausea, use the Bentyl as needed for crampy abdominal pain, use the Imodium as needed for diarrhea.  Plenty of fluids and rest.  Please return to the Emergency Department if you experience any worsening of your condition.   Thank you for allowing [place] to be a part of your care.

## 2023-11-12 ENCOUNTER — Emergency Department (HOSPITAL_COMMUNITY)

## 2023-11-12 ENCOUNTER — Encounter (HOSPITAL_COMMUNITY): Payer: Self-pay | Admitting: Emergency Medicine

## 2023-11-12 ENCOUNTER — Emergency Department (HOSPITAL_COMMUNITY)
Admission: EM | Admit: 2023-11-12 | Discharge: 2023-11-13 | Disposition: A | Discharge status: Nursing Home (Includes ICF) | Source: Skilled Nursing Facility | Attending: Emergency Medicine | Admitting: Emergency Medicine

## 2023-11-12 ENCOUNTER — Other Ambulatory Visit: Payer: Self-pay

## 2023-11-12 DIAGNOSIS — R112 Nausea with vomiting, unspecified: Secondary | ICD-10-CM | POA: Insufficient documentation

## 2023-11-12 DIAGNOSIS — R1084 Generalized abdominal pain: Secondary | ICD-10-CM | POA: Diagnosis not present

## 2023-11-12 DIAGNOSIS — Z7901 Long term (current) use of anticoagulants: Secondary | ICD-10-CM | POA: Insufficient documentation

## 2023-11-12 DIAGNOSIS — J45909 Unspecified asthma, uncomplicated: Secondary | ICD-10-CM | POA: Insufficient documentation

## 2023-11-12 DIAGNOSIS — D72829 Elevated white blood cell count, unspecified: Secondary | ICD-10-CM | POA: Insufficient documentation

## 2023-11-12 DIAGNOSIS — Z859 Personal history of malignant neoplasm, unspecified: Secondary | ICD-10-CM | POA: Insufficient documentation

## 2023-11-12 DIAGNOSIS — R197 Diarrhea, unspecified: Secondary | ICD-10-CM | POA: Insufficient documentation

## 2023-11-12 DIAGNOSIS — E119 Type 2 diabetes mellitus without complications: Secondary | ICD-10-CM | POA: Insufficient documentation

## 2023-11-12 LAB — COMPREHENSIVE METABOLIC PANEL WITH GFR
ALT: 12 U/L (ref 0–44)
AST: 19 U/L (ref 15–41)
Albumin: 3.3 g/dL — ABNORMAL LOW (ref 3.5–5.0)
Alkaline Phosphatase: 127 U/L — ABNORMAL HIGH (ref 38–126)
Anion gap: 10 (ref 5–15)
BUN: 21 mg/dL (ref 8–23)
CO2: 29 mmol/L (ref 22–32)
Calcium: 9 mg/dL (ref 8.9–10.3)
Chloride: 102 mmol/L (ref 98–111)
Creatinine, Ser: 1.22 mg/dL — ABNORMAL HIGH (ref 0.44–1.00)
GFR, Estimated: 43 mL/min — ABNORMAL LOW (ref >60)
Glucose, Bld: 131 mg/dL — ABNORMAL HIGH (ref 70–99)
Potassium: 3.9 mmol/L (ref 3.5–5.1)
Sodium: 141 mmol/L (ref 135–145)
Total Bilirubin: 0.9 mg/dL (ref 0.0–1.2)
Total Protein: 6.2 g/dL — ABNORMAL LOW (ref 6.5–8.1)

## 2023-11-12 LAB — URINALYSIS, ROUTINE W REFLEX MICROSCOPIC
Bacteria, UA: NONE SEEN
Bilirubin Urine: NEGATIVE
Glucose, UA: NEGATIVE mg/dL
Hgb urine dipstick: NEGATIVE
Ketones, ur: NEGATIVE mg/dL
Leukocytes,Ua: NEGATIVE
Nitrite: NEGATIVE
Protein, ur: 30 mg/dL — AB
Specific Gravity, Urine: 1.025 (ref 1.005–1.030)
pH: 6 (ref 5.0–8.0)

## 2023-11-12 LAB — LIPASE, BLOOD: Lipase: 23 U/L (ref 11–51)

## 2023-11-12 LAB — CBC
HCT: 38.4 % (ref 36.0–46.0)
Hemoglobin: 12.2 g/dL (ref 12.0–15.0)
MCH: 31.9 pg (ref 26.0–34.0)
MCHC: 31.8 g/dL (ref 30.0–36.0)
MCV: 100.3 fL — ABNORMAL HIGH (ref 80.0–100.0)
Platelets: 305 K/uL (ref 150–400)
RBC: 3.83 MIL/uL — ABNORMAL LOW (ref 3.87–5.11)
RDW: 14.3 % (ref 11.5–15.5)
WBC: 11.1 K/uL — ABNORMAL HIGH (ref 4.0–10.5)
nRBC: 0 % (ref 0.0–0.2)

## 2023-11-12 MED ORDER — SODIUM CHLORIDE 0.9 % IV BOLUS
1000.0000 mL | Freq: Once | INTRAVENOUS | Status: AC
  Administered 2023-11-12: 1000 mL via INTRAVENOUS

## 2023-11-12 MED ORDER — ONDANSETRON HCL 4 MG/2ML IJ SOLN
4.0000 mg | Freq: Once | INTRAMUSCULAR | Status: AC
  Administered 2023-11-12: 4 mg via INTRAVENOUS
  Filled 2023-11-12: qty 2

## 2023-11-12 MED ORDER — MORPHINE SULFATE (PF) 2 MG/ML IV SOLN
2.0000 mg | Freq: Once | INTRAVENOUS | Status: AC
  Administered 2023-11-12: 2 mg via INTRAVENOUS
  Filled 2023-11-12: qty 1

## 2023-11-12 NOTE — ED Triage Notes (Addendum)
 Pt with dark brown emesis since 8pm yesterday. Family states pt is unable to keep anything down. EMS administered 2mg  Zofran  IV en route. Pt states she started vomiting about 20 mins after eating a chicken salad sandwich last night.

## 2023-11-13 MED ORDER — MORPHINE SULFATE (PF) 2 MG/ML IV SOLN
2.0000 mg | Freq: Once | INTRAVENOUS | Status: AC
  Administered 2023-11-13: 2 mg via INTRAVENOUS
  Filled 2023-11-13: qty 1

## 2023-11-13 NOTE — ED Notes (Signed)
 Attempted to call report to [facility] without answer.

## 2023-11-13 NOTE — ED Provider Notes (Signed)
 [facility] EMERGENCY DEPARTMENT AT [facility] Provider Note   CSN: [id number] Arrival date & time: 11/12/23  2209     Patient presents with: Emesis   Christina Fry is a 83 y.o. female.   The history is provided by the patient and a significant other.  Patient w/extensive history including diabetes, fibromyalgia, Graves' disease, hyperlipidemia, currently wheelchair-bound presents after vomiting and diarrhea for the past 24 hours.  Patient reports she ate chicken salad on Saturday evening August 30 and soon after began having multiple episodes of nonbloody emesis and diarrhea.  She also reports abdominal discomfort.  No fevers.  She was otherwise at her baseline prior to this episode.  Patient given Zofran  en route with minimal improvement  Patient does report she lives in a facility   Previous surgery includes cholecystectomy and hysterectomy Past Medical History:  Diagnosis Date   Anxiety    Cancer (HCC)    Diabetes mellitus without complication (HCC)    Fibromyalgia    GERD (gastroesophageal reflux disease)    Grave's disease    Herpes    Hyperlipidemia    Mild intermittent asthma without complication 04/04/2018   Palpitation    Polymyalgia rheumatica (HCC)     Prior to Admission medications   Medication Sig Start Date End Date Taking? Authorizing Provider  acetaminophen  (TYLENOL ) 325 MG tablet Take 2 tablets (650 mg total) by mouth every 6 (six) hours. 07/19/21   Sherrill Cable Latif, DO  acyclovir  (ZOVIRAX ) 200 MG capsule Take 1 capsule (200 mg total) by mouth 2 (two) times daily. 12/10/19   Jillian Buttery, MD  albuterol  (VENTOLIN  HFA) 108 (90 Base) MCG/ACT inhaler Inhale 2 puffs into the lungs every 6 (six) hours as needed for wheezing or shortness of breath.    [provider]  apixaban  (ELIQUIS ) 2.5 MG TABS tablet Take 2.5 mg by mouth 2 (two) times daily.    [provider]  atorvastatin  (LIPITOR) 40 MG tablet Take 20 mg by mouth daily.     [provider]  budesonide  (RHINOCORT  AQUA) 32 MCG/ACT nasal spray one spray per nostril daily (aim for the ear on each side Patient taking differently: 1 spray 2 (two) times daily as needed for rhinitis. 06/29/20   Iva Marty Saltness, MD  calcium  carbonate (TUMS - DOSED IN MG ELEMENTAL CALCIUM ) 500 MG chewable tablet Chew 500 mg by mouth 2 (two) times daily.    [provider]  cholecalciferol  (VITAMIN D3) 25 MCG (1000 UNIT) tablet Take 1,000 Units by mouth daily.    [provider]  diclofenac  Sodium (VOLTAREN ) 1 % GEL Apply 2 g topically 4 (four) times daily. Apply to left knee and left shoulder 07/22/21   Danton Lauraine LABOR, PA-C  dicyclomine  (BENTYL ) 20 MG tablet Take 1 tablet (20 mg total) by mouth 2 (two) times daily. 05/20/23   Theadore Ozell HERO, MD  docusate sodium  (COLACE) 100 MG capsule Take 1 capsule (100 mg total) by mouth 2 (two) times daily. 07/19/21   Sherrill Cable Latif, DO  EPINEPHrine  0.3 mg/0.3 mL IJ SOAJ injection Inject 0.3 mg into the muscle as needed for anaphylaxis. Patient taking differently: Inject 0.3 mg into the muscle once as needed for anaphylaxis. 04/12/21   Iva Marty Saltness, MD  feeding supplement (ENSURE ENLIVE / ENSURE PLUS) LIQD Take 237 mLs by mouth 2 (two) times daily between meals. 07/19/21   Sherrill Cable Latif, DO  gabapentin  (NEURONTIN ) 100 MG capsule Take 200 mg by mouth 2 (two) times  daily.    [provider]  hydrOXYzine  (ATARAX /VISTARIL ) 10 MG tablet Take 1 tablet (10 mg total) by mouth 2 (two) times daily as needed for itching. 11/29/19   Arlice Reichert, MD  iron  polysaccharides (NIFEREX) 150 MG capsule Take 1 capsule (150 mg total) by mouth daily. 07/19/21   Sherrill Cable Latif, DO  levothyroxine  (SYNTHROID ) 100 MCG tablet Take 1 tablet (100 mcg total) by mouth daily before breakfast. Patient not taking: Reported on 07/14/2021 05/12/21 06/11/21  Perri DELENA Meliton Mickey., MD  lidocaine  (LIDODERM ) 5 % Place 1 patch onto the skin daily  as needed (back pain). Remove & Discard patch within 12 hours 07/27/21   Sebastian Toribio GAILS, MD  loperamide  (IMODIUM ) 2 MG capsule Take 1 capsule (2 mg total) by mouth 4 (four) times daily as needed for diarrhea or loose stools. 05/20/23   Bero, Michael M, MD  melatonin 5 MG TABS Take 5 mg by mouth at bedtime.    [provider]  Metoprolol  Succinate 50 MG CS24 Take 50 mg by mouth daily.    [provider]  Multiple Vitamin (MULTIVITAMIN WITH MINERALS) TABS tablet Take 1 tablet by mouth daily. 07/19/21   Sheikh, Omair Latif, DO  NON FORMULARY Inject 1 Dose into the skin once a week. Allergy shots; patient not sure about name    [provider]  nortriptyline  (PAMELOR ) 25 MG capsule Take 50 mg by mouth at bedtime.    [provider]  ondansetron  (ZOFRAN ) 4 MG tablet Take 1 tablet (4 mg total) by mouth every 6 (six) hours as needed for nausea. 07/19/21   Sheikh, Cable Latif, DO  ondansetron  (ZOFRAN -ODT) 4 MG disintegrating tablet Take 1 tablet (4 mg total) by mouth every 8 (eight) hours as needed for nausea or vomiting. 05/20/23   Theadore Ozell HERO, MD  oxycodone  (OXY-IR) 5 MG capsule Take 1 capsule (5 mg total) by mouth every 4 (four) hours as needed for pain. 07/27/21   Sebastian Toribio GAILS, MD  pantoprazole  (PROTONIX ) 40 MG tablet Take 1 tablet (40 mg total) by mouth 2 (two) times daily. 07/27/21   Sebastian Toribio GAILS, MD  Pediatric Multiple Vitamins (FLINSTONES GUMMIES OMEGA-3 DHA) CHEW Chew 1 tablet by mouth daily.    [provider]  polyethylene glycol (MIRALAX  / GLYCOLAX ) 17 g packet Take 17 g by mouth daily. 07/19/21   Sheikh, Omair Latif, DO  polyvinyl alcohol  (LIQUIFILM TEARS) 1.4 % ophthalmic solution Place 1 drop into both eyes 3 (three) times daily as needed for dry eyes. 07/19/21   Sheikh, Omair Latif, DO  Propylene Glycol (SYSTANE COMPLETE OP) Place 1 drop into both eyes See admin instructions. Five times a day    [provider]    Allergies: Iohexol ,  Codeine, Fentanyl , Morphine  and codeine, Nitrofuran derivatives, and Oxycodone -acetaminophen     Review of Systems  Constitutional:  Positive for fatigue. Negative for fever.  Gastrointestinal:  Positive for abdominal pain, diarrhea and vomiting.    Updated Vital Signs BP (!) 139/54   Pulse 81   Temp 98.2 F (36.8 C) (Oral)   Resp 19   Ht 1.6 m (5' 3)   Wt 65 kg   SpO2 94%   BMI 25.38 kg/m   Physical Exam CONSTITUTIONAL: Elderly no acute distress HEAD: Normocephalic/atraumatic EYES wearing sunglasses ENMT: Mucous membranes dry NECK: supple no meningeal signs CV: S1/S2 noted, no murmurs/rubs/gallops noted LUNGS: Lungs are clear to auscultation bilaterally, no apparent distress ABDOMEN: soft, obese, diffuse moderate tenderness, decreased bowel  sounds NEURO: Pt is awake/alert/appropriate, moves all extremitiesx4.  No facial droop.   EXTREMITIES: pulses normal/equal, full ROM SKIN: warm, color normal  (all labs ordered are listed, but only abnormal results are displayed) Labs Reviewed  COMPREHENSIVE METABOLIC PANEL WITH GFR - Abnormal; Notable for the following components:      Result Value   Glucose, Bld 131 (*)    Creatinine, Ser 1.22 (*)    Total Protein 6.2 (*)    Albumin 3.3 (*)    Alkaline Phosphatase 127 (*)    GFR, Estimated 43 (*)    All other components within normal limits  CBC - Abnormal; Notable for the following components:   WBC 11.1 (*)    RBC 3.83 (*)    MCV 100.3 (*)    All other components within normal limits  URINALYSIS, ROUTINE W REFLEX MICROSCOPIC - Abnormal; Notable for the following components:   Protein, ur 30 (*)    All other components within normal limits  LIPASE, BLOOD    EKG: EKG Interpretation Date/Time:  Sunday November 12 2023 23:08:58 EDT Ventricular Rate:  76 PR Interval:  140 QRS Duration:  100 QT Interval:  421 QTC Calculation: 474 R Axis:   69  Text Interpretation: Sinus rhythm RSR' in V1 or V2, right VCD or RVH  Borderline T abnormalities, anterior leads No significant change since last tracing Confirmed by Midge Golas ([id number]) on 11/12/2023 11:23:55 PM  Radiology: CT ABDOMEN PELVIS WO CONTRAST Result Date: 11/13/2023 CLINICAL DATA:  Acute nonlocalized abdominal pain EXAM: CT ABDOMEN AND PELVIS WITHOUT CONTRAST TECHNIQUE: Multidetector CT imaging of the abdomen and pelvis was performed following the standard protocol without IV contrast. RADIATION DOSE REDUCTION: This exam was performed according to the departmental dose-optimization program which includes automated exposure control, adjustment of the mA and/or kV according to patient size and/or use of iterative reconstruction technique. COMPARISON:  05/20/2023, 04/28/2021 FINDINGS: Lower chest: No acute abnormality.  Small hiatal hernia Hepatobiliary: Stable cyst within the right hepatic lobe. Status post cholecystectomy. Mild intra and moderate extrahepatic biliary ductal dilation is again identified and appears stable since prior examination, likely representing post cholecystectomy change. Pancreas: Unremarkable Spleen: Unremarkable Adrenals/Urinary Tract: The adrenal glands are unremarkable. The kidneys are normal in position. Mild bilateral renal cortical atrophy and scarring. No hydronephrosis. No intrarenal or ureteral calculi. No perinephric inflammatory stranding or fluid collections are seen. The bladder is decompressed and is otherwise unremarkable. Stomach/Bowel: Pancolonic diverticulosis, more severe within the descending and sigmoid colon noted. No superimposed acute inflammatory change. Stomach, small bowel, and large bowel are otherwise unremarkable. Appendix absent. No evidence of obstruction or focal inflammation. No free intraperitoneal gas or fluid. Vascular/Lymphatic: Aortic atherosclerosis. No enlarged abdominal or pelvic lymph nodes. Reproductive: Status post hysterectomy. No adnexal masses. Other: Ventral hernia repair with mesh has been  performed. No recurrent abdominal wall hernia identified. Musculoskeletal: Mild anterior wedge compression deformity of T12 status post vertebroplasty. Left hip ORIF noted. No acute bone abnormality. No lytic or blastic bone lesion. IMPRESSION: 1. No acute intra-abdominal pathology identified. No definite radiographic explanation for the patient's reported symptoms. 2. Pancolonic diverticulosis without superimposed acute inflammatory change. 3. Small hiatal hernia. 4. Aortic atherosclerosis. Aortic Atherosclerosis (ICD10-I70.0). Electronically Signed   By: Dorethia Molt M.D.   On: 11/13/2023 01:35     Procedures   Medications Ordered in the ED  sodium chloride  0.9 % bolus 1,000 mL (0 mLs Intravenous Stopped 11/13/23 0103)  ondansetron  (ZOFRAN ) injection 4 mg (4 mg Intravenous Given 11/12/23  2352)  morphine  (PF) 2 MG/ML injection 2 mg (2 mg Intravenous Given 11/12/23 2352)  morphine  (PF) 2 MG/ML injection 2 mg (2 mg Intravenous Given 11/13/23 0120)    Clinical Course as of 11/13/23 0229  Mon Nov 13, 2023  0010 WBC(!): 11.1 Mild leukocytosis [DW]  0010 Creatinine(!): 1.22 Mild renal insufficiency [DW]  0229 Patient presented with nausea vomiting abdominal pain and diarrhea.  She thought it was related to chicken salad.  Due to her diffuse tenderness, CT imaging was recommended though it needed to be done without contrast due to allergy  No acute findings on CT imaging.  Overall patient is improving, no vomiting or diarrhea here  Patient is safe for discharge.  She was given IV fluids and feels improved.  She already has Zofran  at home [DW]    Clinical Course User Index [DW] Midge Golas, MD                                 Medical Decision Making Amount and/or Complexity of Data Reviewed Labs: ordered. Decision-making details documented in ED Course. Radiology: ordered.  Risk Prescription drug management.   This patient presents to the ED for concern of abdominal pain, vomiting and  diarrhea, this involves an extensive number of treatment options, and is a complaint that carries with it a high risk of complications and morbidity.  The differential diagnosis includes but is not limited to  pancreatitis, gastritis, peptic ulcer disease, appendicitis, bowel obstruction, bowel perforation, diverticulitis, AAA, ischemic bowel, acute coronary syndrome, gastroenteritis   Comorbidities that complicate the patient evaluation: Patient's presentation is complicated by their history of Graves' disease  Social Determinants of Health: Patient's poor mobility  increases the complexity of managing their presentation  Additional history obtained: Additional history obtained from family  Lab Tests: I Ordered, and personally interpreted labs.  The pertinent results include: Mild leukocytosis, renal insufficiency  Imaging Studies ordered: I ordered imaging studies including CT scan abdomen pelvis without contrast  I independently visualized and interpreted imaging which showed no acute findings I agree with the radiologist interpretation  Cardiac Monitoring: The patient was maintained on a cardiac monitor.  I personally viewed and interpreted the cardiac monitor which showed an underlying rhythm of:  sinus rhythm  Medicines ordered and prescription drug management: I ordered medication including Zofran  and morphine  for pain and nausea Reevaluation of the patient after these medicines showed that the patient    improved  Reevaluation: After the interventions noted above, I reevaluated the patient and found that they have :improved  Complexity of problems addressed: Patient's presentation is most consistent with  acute presentation with potential threat to life or bodily function  Disposition: After consideration of the diagnostic results and the patient's response to treatment,  I feel that the patent would benefit from discharge  .        Final diagnoses:  Nausea vomiting  and diarrhea  Generalized abdominal pain    ED Discharge Orders     None          Midge Golas, MD 11/13/23 0230

## 2023-11-13 NOTE — ED Notes (Signed)
 Spoke with staff at the [facility].  Advised daughter will be bring her back to the facility.

## 2023-11-13 NOTE — ED Notes (Signed)
 Attempted to call the [facility] with no answer.

## 2023-11-13 NOTE — Discharge Instructions (Signed)
   SEEK IMMEDIATE MEDICAL ATTENTION IF: The pain does not go away or becomes severe, particularly over the next 8-12 hours.  A temperature above 100.2F develops.  Repeated vomiting occurs (multiple episodes).   Blood is being passed in stools or vomit (bright red or black tarry stools).  Return also if you develop chest pain, difficulty breathing, dizziness or fainting, or become confused, poorly responsive, or inconsolable.

## 2023-11-13 NOTE — ED Notes (Signed)
Patient given water to sip on
